# Patient Record
Sex: Female | Born: 1970 | Race: White | Hispanic: No | Marital: Single | Smoking: Never smoker
Health system: Southern US, Community
[De-identification: ages and names within clinical notes are randomized; demographics above are authoritative.]

## PROBLEM LIST (undated history)

## (undated) DIAGNOSIS — N390 Urinary tract infection, site not specified: Secondary | ICD-10-CM

## (undated) DIAGNOSIS — IMO0002 Reserved for concepts with insufficient information to code with codable children: Secondary | ICD-10-CM

## (undated) DIAGNOSIS — R310 Gross hematuria: Secondary | ICD-10-CM

## (undated) DIAGNOSIS — M8580 Other specified disorders of bone density and structure, unspecified site: Secondary | ICD-10-CM

## (undated) DIAGNOSIS — N3090 Cystitis, unspecified without hematuria: Secondary | ICD-10-CM

## (undated) DIAGNOSIS — R51 Headache: Secondary | ICD-10-CM

## (undated) DIAGNOSIS — K2970 Gastritis, unspecified, without bleeding: Secondary | ICD-10-CM

## (undated) DIAGNOSIS — N840 Polyp of corpus uteri: Secondary | ICD-10-CM

## (undated) DIAGNOSIS — K219 Gastro-esophageal reflux disease without esophagitis: Secondary | ICD-10-CM

## (undated) DIAGNOSIS — G43909 Migraine, unspecified, not intractable, without status migrainosus: Secondary | ICD-10-CM

## (undated) DIAGNOSIS — N399 Disorder of urinary system, unspecified: Secondary | ICD-10-CM

## (undated) DIAGNOSIS — R3129 Other microscopic hematuria: Secondary | ICD-10-CM

## (undated) DIAGNOSIS — Z87442 Personal history of urinary calculi: Secondary | ICD-10-CM

## (undated) DIAGNOSIS — N809 Endometriosis, unspecified: Secondary | ICD-10-CM

## (undated) DIAGNOSIS — F419 Anxiety disorder, unspecified: Secondary | ICD-10-CM

## (undated) DIAGNOSIS — J45909 Unspecified asthma, uncomplicated: Secondary | ICD-10-CM

## (undated) DIAGNOSIS — G629 Polyneuropathy, unspecified: Secondary | ICD-10-CM

## (undated) DIAGNOSIS — G8929 Other chronic pain: Secondary | ICD-10-CM

## (undated) DIAGNOSIS — R102 Pelvic and perineal pain: Secondary | ICD-10-CM

## (undated) DIAGNOSIS — R519 Headache, unspecified: Secondary | ICD-10-CM

## (undated) HISTORY — DX: Reserved for concepts with insufficient information to code with codable children: IMO0002

## (undated) HISTORY — PX: DIAGNOSTIC LAPAROSCOPY: SUR761

## (undated) HISTORY — DX: Cystitis, unspecified without hematuria: N30.90

## (undated) HISTORY — PX: GUM SURGERY: SHX658

## (undated) HISTORY — DX: Other microscopic hematuria: R31.29

## (undated) HISTORY — DX: Polyp of corpus uteri: N84.0

## (undated) HISTORY — DX: Endometriosis, unspecified: N80.9

## (undated) HISTORY — PX: POLYPECTOMY: SHX149

## (undated) HISTORY — DX: Polyneuropathy, unspecified: G62.9

## (undated) HISTORY — DX: Migraine, unspecified, not intractable, without status migrainosus: G43.909

## (undated) HISTORY — DX: Other specified disorders of bone density and structure, unspecified site: M85.80

## (undated) HISTORY — DX: Headache, unspecified: R51.9

## (undated) HISTORY — PX: DILATION AND CURETTAGE OF UTERUS: SHX78

## (undated) HISTORY — DX: Headache: R51

## (undated) HISTORY — DX: Urinary tract infection, site not specified: N39.0

## (undated) HISTORY — DX: Pelvic and perineal pain: R10.2

## (undated) HISTORY — DX: Gastritis, unspecified, without bleeding: K29.70

## (undated) HISTORY — DX: Other chronic pain: G89.29

## (undated) HISTORY — DX: Gross hematuria: R31.0

## (undated) HISTORY — DX: Unspecified asthma, uncomplicated: J45.909

---

## 2005-04-17 HISTORY — PX: OTHER SURGICAL HISTORY: SHX169

## 2005-04-17 HISTORY — PX: CYSTOSCOPY: SUR368

## 2008-01-14 ENCOUNTER — Ambulatory Visit: Payer: Self-pay | Admitting: Urology

## 2008-02-03 ENCOUNTER — Ambulatory Visit: Payer: Self-pay | Admitting: Urology

## 2009-11-29 ENCOUNTER — Encounter: Payer: Self-pay | Admitting: Otolaryngology

## 2009-12-16 ENCOUNTER — Encounter: Payer: Self-pay | Admitting: Otolaryngology

## 2010-01-15 ENCOUNTER — Encounter: Payer: Self-pay | Admitting: Otolaryngology

## 2012-11-19 ENCOUNTER — Ambulatory Visit: Payer: Self-pay

## 2013-10-03 ENCOUNTER — Ambulatory Visit: Payer: Self-pay | Admitting: Urology

## 2013-10-15 ENCOUNTER — Telehealth: Payer: Self-pay | Admitting: *Deleted

## 2013-10-15 NOTE — Telephone Encounter (Signed)
I called and spoke to pt. She has family hx of colon cancer in her father. But she also has constipation problems and sometimes diarrhea. Will schedule OV and pt is looking at schedule and will call back.

## 2013-10-15 NOTE — Telephone Encounter (Signed)
Pt called wanting to get scheduled for a TCS per pt Dr. Caroline MoreVanDavis is sending a referral I made pt aware that we do not have a referral. Please advise when you have the referral [phone]

## 2013-10-16 NOTE — Telephone Encounter (Signed)
Pt is scheduled for OV with Gerrit HallsAnna Sams, NP on 11/18/2013 at 1:30 PM.

## 2013-11-18 ENCOUNTER — Encounter: Payer: Self-pay | Admitting: Gastroenterology

## 2013-11-18 ENCOUNTER — Ambulatory Visit: Payer: BC Managed Care – PPO | Admitting: Gastroenterology

## 2013-11-18 ENCOUNTER — Encounter (INDEPENDENT_AMBULATORY_CARE_PROVIDER_SITE_OTHER): Payer: Self-pay

## 2013-11-18 VITALS — BP 101/68 | HR 70 | Temp 98.3°F | Resp 18 | Ht 70.0 in | Wt 155.0 lb

## 2013-11-21 DIAGNOSIS — Z1211 Encounter for screening for malignant neoplasm of colon: Secondary | ICD-10-CM | POA: Insufficient documentation

## 2013-11-21 DIAGNOSIS — Z0000 Encounter for general adult medical examination without abnormal findings: Secondary | ICD-10-CM | POA: Insufficient documentation

## 2013-11-21 NOTE — Progress Notes (Signed)
Present to discuss screening colonoscopy. Insurance will not cover at [facility] as this will have hospital charges associated. Patient to seek screening colonoscopy elsewhere. NO charge for today's visit.  

## 2014-08-28 ENCOUNTER — Ambulatory Visit: Payer: BC Managed Care – PPO | Admitting: Nurse Practitioner

## 2014-09-07 ENCOUNTER — Ambulatory Visit: Payer: BC Managed Care – PPO | Admitting: Nurse Practitioner

## 2014-09-28 ENCOUNTER — Ambulatory Visit (INDEPENDENT_AMBULATORY_CARE_PROVIDER_SITE_OTHER): Payer: BC Managed Care – PPO | Admitting: Nurse Practitioner

## 2014-09-28 ENCOUNTER — Encounter: Payer: Self-pay | Admitting: Nurse Practitioner

## 2014-09-28 ENCOUNTER — Telehealth: Payer: Self-pay | Admitting: *Deleted

## 2014-09-28 VITALS — BP 102/62 | HR 64 | Temp 97.1°F | Resp 14 | Ht 70.0 in | Wt 154.8 lb

## 2014-09-28 DIAGNOSIS — Z130 Encounter for screening for diseases of the blood and blood-forming organs and certain disorders involving the immune mechanism: Secondary | ICD-10-CM

## 2014-09-28 DIAGNOSIS — Z7689 Persons encountering health services in other specified circumstances: Secondary | ICD-10-CM | POA: Insufficient documentation

## 2014-09-28 DIAGNOSIS — Z131 Encounter for screening for diabetes mellitus: Secondary | ICD-10-CM

## 2014-09-28 DIAGNOSIS — G43809 Other migraine, not intractable, without status migrainosus: Secondary | ICD-10-CM | POA: Insufficient documentation

## 2014-09-28 DIAGNOSIS — G43109 Migraine with aura, not intractable, without status migrainosus: Secondary | ICD-10-CM

## 2014-09-28 DIAGNOSIS — Z1211 Encounter for screening for malignant neoplasm of colon: Secondary | ICD-10-CM

## 2014-09-28 DIAGNOSIS — Z7189 Other specified counseling: Secondary | ICD-10-CM | POA: Diagnosis not present

## 2014-09-28 DIAGNOSIS — H6123 Impacted cerumen, bilateral: Secondary | ICD-10-CM | POA: Insufficient documentation

## 2014-09-28 LAB — CBC WITH DIFFERENTIAL/PLATELET
BASOS ABS: 0 10*3/uL (ref 0.0–0.1)
Basophils Relative: 0.3 % (ref 0.0–3.0)
EOS PCT: 0.9 % (ref 0.0–5.0)
Eosinophils Absolute: 0.1 10*3/uL (ref 0.0–0.7)
HCT: 39.8 % (ref 36.0–46.0)
HEMOGLOBIN: 13.2 g/dL (ref 12.0–15.0)
LYMPHS ABS: 2.1 10*3/uL (ref 0.7–4.0)
Lymphocytes Relative: 32.3 % (ref 12.0–46.0)
MCHC: 33.2 g/dL (ref 30.0–36.0)
MCV: 90.9 fl (ref 78.0–100.0)
Monocytes Absolute: 0.5 10*3/uL (ref 0.1–1.0)
Monocytes Relative: 7.8 % (ref 3.0–12.0)
Neutro Abs: 3.9 10*3/uL (ref 1.4–7.7)
Neutrophils Relative %: 58.7 % (ref 43.0–77.0)
Platelets: 223 10*3/uL (ref 150.0–400.0)
RBC: 4.38 Mil/uL (ref 3.87–5.11)
RDW: 13.5 % (ref 11.5–15.5)
WBC: 6.6 10*3/uL (ref 4.0–10.5)

## 2014-09-28 LAB — HEMOGLOBIN A1C: Hgb A1c MFr Bld: 5.3 % (ref 4.6–6.5)

## 2014-09-28 NOTE — Assessment & Plan Note (Signed)
Patient's appointment is 6/30 to have colonoscopy. Family history of colon cancer.

## 2014-09-28 NOTE — Telephone Encounter (Signed)
Called pt to come back to have blood re-draw and explained that it was my error 

## 2014-09-28 NOTE — Telephone Encounter (Signed)
Pt came back.  

## 2014-09-28 NOTE — Patient Instructions (Signed)
See if the Debrox works, if not make an appointment for ear cleaning.   We will do non-fasting labs today and fasting labs at a later time.   Welcome to [facility]!

## 2014-09-28 NOTE — Assessment & Plan Note (Signed)
Being seen by Dr. Sherryll Burger at [facility]. Pt has also had inner ear rehab with Dr. Andee Poles. No signs of vertigo per pt, but both docs still feel it is inner ear related. Will follow.

## 2014-09-28 NOTE — Progress Notes (Signed)
   Subjective:    Patient ID: Cristina Wade, female    DOB: 04-02-1971, 44 y.o.   MRN: [id number]  HPI  Cristina Wade is a 44 yo female establishing care today.   1) New pt info:   Immunizations- tdap 9 years ago  Mammogram- 2015- [facility]; Dr. Arvil Chaco   Pap- 2015   Bone Density- 1995 osteopenia  Colonoscopy- 10/15/14 scheduled   Eye Exam- 2015   Dental Exam- UTD  2) Chronic Problems-  ENT- inner ear damage left ear   3) Acute Problems-  Ear wax, left ear hard hearing   Kidney- Saw Michiel Cowboy, PA, kidney stones, still haven't passed them. No recent viewing blood in urine Blood found in urine over past 2 years, concenre because she has to strain when urinating.   Sees Dr. Sherryll Burger for Neurology- Had vestibular migraines  Did Rehab for this with Dr. Andee Poles- ENT   Review of Systems  Constitutional: Negative for fever, chills, diaphoresis and fatigue.  Respiratory: Negative for chest tightness, shortness of breath and wheezing.   Cardiovascular: Negative for chest pain, palpitations and leg swelling.  Gastrointestinal: Negative for nausea, vomiting, diarrhea and rectal pain.  Skin: Negative for rash.  Neurological: Negative for dizziness, weakness, numbness and headaches.  Psychiatric/Behavioral: The patient is not nervous/anxious.        Objective:   Physical Exam  Constitutional: She is oriented to person, place, and time. She appears well-developed and well-nourished. No distress.  BP 102/62 mmHg  Pulse 64  Temp(Src) 97.1 F (36.2 C) (Oral)  Resp 14  Ht 5\' 10"  (1.778 m)  Wt 154 lb 12.8 oz (70.217 kg)  BMI 22.21 kg/m2  SpO2 95%  LMP 08/18/2014 (Exact Date)   HENT:  Head: Normocephalic and atraumatic.  Right Ear: External ear normal.  Left Ear: External ear normal.  Cardiovascular: Normal rate, regular rhythm, normal heart sounds and intact distal pulses.  Exam reveals no gallop and no friction rub.   No murmur heard. Pulmonary/Chest: Effort normal and breath  sounds normal. No respiratory distress. She has no wheezes. She has no rales. She exhibits no tenderness.  Neurological: She is alert and oriented to person, place, and time. No cranial nerve deficit. She exhibits normal muscle tone. Coordination normal.  Skin: Skin is warm and dry. No rash noted. She is not diaphoretic.  Psychiatric: She has a normal mood and affect. Her behavior is normal. Judgment and thought content normal.      Assessment & Plan:

## 2014-09-28 NOTE — Progress Notes (Signed)
Pre visit review using our clinic review tool, if applicable. No additional management support is needed unless otherwise documented below in the visit note. 

## 2014-09-28 NOTE — Assessment & Plan Note (Signed)
Patient and sister had same complaint. Pt has fully blocked TM's bilaterally with a wet dark cerumen. She will try Debrox at home and call if she needs to have them cleaned at the office. Will follow

## 2014-09-28 NOTE — Assessment & Plan Note (Signed)
Discussed acute and chronic issues. Reviewed health maintenance measures, PFSHx, and immunizations. Obtain non-fasting routine labs of A1c and CBC w/ diff today. Will obtain fasting at a later appointment

## 2014-10-02 ENCOUNTER — Ambulatory Visit: Payer: BC Managed Care – PPO | Admitting: Nurse Practitioner

## 2014-10-20 ENCOUNTER — Ambulatory Visit: Payer: Self-pay | Admitting: Urology

## 2014-10-21 ENCOUNTER — Encounter: Payer: Self-pay | Admitting: *Deleted

## 2014-10-22 ENCOUNTER — Telehealth: Payer: Self-pay | Admitting: Urology

## 2014-10-22 ENCOUNTER — Ambulatory Visit (INDEPENDENT_AMBULATORY_CARE_PROVIDER_SITE_OTHER): Payer: BC Managed Care – PPO | Admitting: Urology

## 2014-10-22 ENCOUNTER — Ambulatory Visit
Admission: RE | Admit: 2014-10-22 | Discharge: 2014-10-22 | Disposition: A | Discharge status: Home / Self Care | Payer: BC Managed Care – PPO | Source: Ambulatory Visit | Attending: Urology | Admitting: Urology

## 2014-10-22 ENCOUNTER — Encounter: Payer: Self-pay | Admitting: Urology

## 2014-10-22 VITALS — BP 108/69 | HR 71 | Ht 70.0 in | Wt 153.0 lb

## 2014-10-22 DIAGNOSIS — R109 Unspecified abdominal pain: Secondary | ICD-10-CM | POA: Insufficient documentation

## 2014-10-22 DIAGNOSIS — R3129 Other microscopic hematuria: Secondary | ICD-10-CM

## 2014-10-22 DIAGNOSIS — R300 Dysuria: Secondary | ICD-10-CM | POA: Insufficient documentation

## 2014-10-22 DIAGNOSIS — Z87442 Personal history of urinary calculi: Secondary | ICD-10-CM | POA: Diagnosis not present

## 2014-10-22 DIAGNOSIS — R312 Other microscopic hematuria: Secondary | ICD-10-CM

## 2014-10-22 DIAGNOSIS — R3916 Straining to void: Secondary | ICD-10-CM | POA: Insufficient documentation

## 2014-10-22 LAB — URINALYSIS, COMPLETE
Bilirubin, UA: NEGATIVE
Glucose, UA: NEGATIVE
Nitrite, UA: NEGATIVE
PROTEIN UA: NEGATIVE
Specific Gravity, UA: 1.02 (ref 1.005–1.030)
Urobilinogen, Ur: 0.2 mg/dL (ref 0.2–1.0)
pH, UA: 6 (ref 5.0–7.5)

## 2014-10-22 LAB — MICROSCOPIC EXAMINATION: Bacteria, UA: NONE SEEN

## 2014-10-22 LAB — BLADDER SCAN AMB NON-IMAGING: Scan Result: 0

## 2014-10-22 NOTE — Telephone Encounter (Signed)
Please tell the patient that her KUB was negative for stones and to please schedule an appointment after she has her colonoscopy.

## 2014-10-22 NOTE — Progress Notes (Signed)
10/22/2014 1:32 PM   Cristina Wade Jul 12, 1970 [id number]  Referring provider: Octaviano Batty, MD [address] [place], [place] [address]  Chief Complaint  Patient presents with  . Hematuria    one year follow up, patient also mentioned not being able to urinate well    HPI: Cristina Wade is a 44 year old white female who presents today for a 1 year follow-up after her workup for hematuria. Her hematuria workup last year did not reveal any GU pathology, but some possible punctate stones.  She denies any gross hematuria, but she does admit to a dark color to her urine on occasion.  She is not experiencing dysuria or suprapubic pain.   She denies any fevers, chills, nausea or vomiting. She is having some mild bilateral flank pain.  She states she is having difficulty urinating.  She often finds herself straining to urinate.  She has mild stress incontinence, losing urine when she laughs, coughs or sneezes.  The leakage is not severe enough to wear a pad.  She is consuming 24 oz of water with her breakfast, lunch and dinner.  Her PVR today is 0 mL. She is scheduled to undergo a colonoscopy for constipation issues in the next few weeks.    PMH: Past Medical History  Diagnosis Date  . Asthma   . Frequent headaches   . Migraines   . UTI (lower urinary tract infection)   . Kidney stones   . Cystitis   . Osteopenia   . Endometriosis   . Microscopic hematuria   . Gross hematuria   . Cystocele     Surgical History: Past Surgical History  Procedure Laterality Date  . Gum surgery    . Laproscopy  2007  . Cystoscopy  2007    with biopsy     Home Medications:    Medication List       This list is accurate as of: 10/22/14  1:32 PM.  Always use your most recent med list.               Calcium-Vitamin D 600-200 MG-UNIT per tablet  Take 1 tablet by mouth.     levonorgestrel-ethinyl estradiol 0.15-0.03 MG tablet  Commonly known as:  SEASONALE,INTROVALE,JOLESSA  Take 1  tablet by mouth.     MULTI-VITAMINS Tabs  Take 1 tablet by mouth.     RA VITAMIN B-12 TR 1000 MCG Tbcr  Generic drug:  Cyanocobalamin  Take 1 tablet by mouth.        Allergies:  Allergies  Allergen Reactions  . Lac Bovis Other (See Comments)  . Penicillins     Family History: Family History  Problem Relation Age of Onset  . Colon cancer Father   . Arthritis Father   . Hyperlipidemia Father   . Cancer Father     colon/prostate  . Arthritis Mother   . Heart disease Mother   . Stroke Mother   . Hypertension Mother   . Kidney disease Mother   . Diabetes Mother   . Cancer Maternal Grandmother   . Cancer Paternal Grandfather     prostate    Social History:  reports that she has never smoked. She has never used smokeless tobacco. She reports that she does not drink alcohol or use illicit drugs.  ROS: Urological Symptom Review  Patient is experiencing the following symptoms: Frequent urination Trouble starting stream Have to strain to urinate Blood in urine   Review of Systems  Gastrointestinal (upper)  :  Negative for upper GI symptoms  Gastrointestinal (lower) : Constipation  Constitutional : Negative for symptoms  Skin: Negative for skin symptoms  Eyes: Negative for eye symptoms  Ear/Nose/Throat : Negative for Ear/Nose/Throat symptoms  Hematologic/Lymphatic: Negative for Hematologic/Lymphatic symptoms  Cardiovascular : Negative for cardiovascular symptoms  Respiratory : Negative for respiratory symptoms  Endocrine: Negative for endocrine symptoms  Musculoskeletal: Negative for musculoskeletal symptoms  Neurological: Negative for neurological symptoms  Psychologic: Negative for psychiatric symptoms   Physical Exam: LMP 08/18/2014 (Exact Date)  Normal external genitalia.  Normal urethral meatus. No urethral masses and/or tenderness. Urethral hypermobility is noted but no leakage on exam. No bladder fullness or masses. No vaginal  lesions or discharge. No cervical motion tenderness was noted. Uterus was freely mobile and not fixed. No adnexal masses are palpated. Normal rectal tone, no masses. Normal anus and perineum.    Laboratory Data: Results for orders placed or performed in visit on 10/22/14  Microscopic Examination  Result Value Ref Range   WBC, UA 0-5 0 -  5 /hpf   RBC, UA 3-10 (A) 0 -  2 /hpf   Epithelial Cells (non renal) 0-10 0 - 10 /hpf   Mucus, UA Present (A) Not Estab.   Bacteria, UA None seen None seen/Few  Urinalysis, Complete  Result Value Ref Range   Specific Gravity, UA 1.020 1.005 - 1.030   pH, UA 6.0 5.0 - 7.5   Color, UA Yellow Yellow   Appearance Ur Clear Clear   Leukocytes, UA Trace (A) Negative   Protein, UA Negative Negative/Trace   Glucose, UA Negative Negative   Ketones, UA Trace (A) Negative   RBC, UA 2+ (A) Negative   Bilirubin, UA Negative Negative   Urobilinogen, Ur 0.2 0.2 - 1.0 mg/dL   Nitrite, UA Negative Negative   Microscopic Examination See below:   BLADDER SCAN AMB NON-IMAGING  Result Value Ref Range   Scan Result 0    Lab Results  Component Value Date   WBC 6.6 09/28/2014   HGB 13.2 09/28/2014   HCT 39.8 09/28/2014   MCV 90.9 09/28/2014   PLT 223.0 09/28/2014    No results found for: CREATININE  No results found for: PSA  No results found for: TESTOSTERONE  Lab Results  Component Value Date   HGBA1C 5.3 09/28/2014    Urinalysis No results found for: COLORURINE, APPEARANCEUR, LABSPEC, PHURINE, GLUCOSEU, HGBUR, BILIRUBINUR, KETONESUR, PROTEINUR, UROBILINOGEN, NITRITE, LEUKOCYTESUR  Pertinent Imaging: CLINICAL DATA: Microscopic hematuria today. Bilateral flank pain. History of nephrolithiasis and dysuria.  EXAM: ABDOMEN - 1 VIEW  COMPARISON: Abdomen and pelvis CT dated 10/03/2013.  FINDINGS: Previously demonstrated tiny pelvic phlebolith on the left inferiorly. No visible calcified urinary tract calculi. Normal bowel gas pattern. Normal  appearing bones.  IMPRESSION: No acute abnormality. No radiographically visible urinary tract calculi.  Assessment & Plan:    1. Microscopic hematuria:   Patient underwent a hematuria workup with CT urogram and cystoscopy which was negative for GU pathology, some possible punctate bilateral stones.  She still continues to have microscopic hematuria on today's UA. We will refer her to nephrology for further evaluation.  We'll obtain a KUB today as well.  - Urinalysis, Complete  2. Straining to urinate:   Patient appears to be consuming adequate fluids. Her PVR today is 0 mL. She did have urethral hypermobility on exam today, this may be causing the sensation of needing to strain to urinate. She also is suffering from constipation and that too may be continuing  to her urinary issues. She is to undergo this colonoscopy in 2 weeks.  If her urinary issues are not relieved after her constipation has been addressed, we will refer for UDS.  - BLADDER SCAN AMB NON-IMAGING   No Follow-up on file.  Michiel CowboySHANNON Bruchy Mikel, PA-C  [facility] [address], [address] [place], [place] [address] [phone]

## 2014-10-23 NOTE — Telephone Encounter (Signed)
Spoke with pt in reference to KUB. Pt voiced understanding stating she will call back for an appt after colonoscopy is completed. Cw,lpn

## 2014-11-05 ENCOUNTER — Ambulatory Visit
Admission: RE | Admit: 2014-11-05 | Discharge: 2014-11-05 | Disposition: A | Discharge status: Home / Self Care | Payer: BC Managed Care – PPO | Source: Ambulatory Visit | Attending: Unknown Physician Specialty | Admitting: Unknown Physician Specialty

## 2014-11-05 ENCOUNTER — Ambulatory Visit: Payer: BC Managed Care – PPO | Admitting: Anesthesiology

## 2014-11-05 ENCOUNTER — Encounter
Admission: RE | Disposition: A | Discharge status: Home / Self Care | Payer: Self-pay | Source: Ambulatory Visit | Attending: Unknown Physician Specialty

## 2014-11-05 ENCOUNTER — Encounter: Payer: Self-pay | Admitting: *Deleted

## 2014-11-05 DIAGNOSIS — Z8042 Family history of malignant neoplasm of prostate: Secondary | ICD-10-CM | POA: Diagnosis not present

## 2014-11-05 DIAGNOSIS — Z87442 Personal history of urinary calculi: Secondary | ICD-10-CM | POA: Diagnosis not present

## 2014-11-05 DIAGNOSIS — Z841 Family history of disorders of kidney and ureter: Secondary | ICD-10-CM | POA: Diagnosis not present

## 2014-11-05 DIAGNOSIS — G43909 Migraine, unspecified, not intractable, without status migrainosus: Secondary | ICD-10-CM | POA: Insufficient documentation

## 2014-11-05 DIAGNOSIS — Z1211 Encounter for screening for malignant neoplasm of colon: Secondary | ICD-10-CM | POA: Insufficient documentation

## 2014-11-05 DIAGNOSIS — Z800 Family history of malignant neoplasm of digestive organs: Secondary | ICD-10-CM | POA: Diagnosis not present

## 2014-11-05 DIAGNOSIS — Z8261 Family history of arthritis: Secondary | ICD-10-CM | POA: Insufficient documentation

## 2014-11-05 DIAGNOSIS — Z8249 Family history of ischemic heart disease and other diseases of the circulatory system: Secondary | ICD-10-CM | POA: Insufficient documentation

## 2014-11-05 DIAGNOSIS — Z8489 Family history of other specified conditions: Secondary | ICD-10-CM | POA: Diagnosis not present

## 2014-11-05 DIAGNOSIS — K640 First degree hemorrhoids: Secondary | ICD-10-CM | POA: Insufficient documentation

## 2014-11-05 DIAGNOSIS — Z808 Family history of malignant neoplasm of other organs or systems: Secondary | ICD-10-CM | POA: Insufficient documentation

## 2014-11-05 DIAGNOSIS — M8580 Other specified disorders of bone density and structure, unspecified site: Secondary | ICD-10-CM | POA: Insufficient documentation

## 2014-11-05 DIAGNOSIS — Z79899 Other long term (current) drug therapy: Secondary | ICD-10-CM | POA: Diagnosis not present

## 2014-11-05 DIAGNOSIS — Z833 Family history of diabetes mellitus: Secondary | ICD-10-CM | POA: Insufficient documentation

## 2014-11-05 DIAGNOSIS — Z823 Family history of stroke: Secondary | ICD-10-CM | POA: Insufficient documentation

## 2014-11-05 HISTORY — PX: COLONOSCOPY: SHX5424

## 2014-11-05 LAB — HM COLONOSCOPY: HM Colonoscopy: NORMAL

## 2014-11-05 LAB — POCT PREGNANCY, URINE: PREG TEST UR: NEGATIVE

## 2014-11-05 SURGERY — COLONOSCOPY
Anesthesia: General

## 2014-11-05 MED ORDER — PROPOFOL INFUSION 10 MG/ML OPTIME
INTRAVENOUS | Status: DC | PRN
  Administered 2014-11-05: 140 ug/kg/min via INTRAVENOUS

## 2014-11-05 MED ORDER — PROPOFOL 10 MG/ML IV BOLUS
INTRAVENOUS | Status: DC | PRN
  Administered 2014-11-05: 50 mg via INTRAVENOUS

## 2014-11-05 MED ORDER — MIDAZOLAM HCL 5 MG/5ML IJ SOLN
INTRAMUSCULAR | Status: DC | PRN
  Administered 2014-11-05: 1 mg via INTRAVENOUS

## 2014-11-05 MED ORDER — SODIUM CHLORIDE 0.9 % IV SOLN
INTRAVENOUS | Status: DC
Start: 2014-11-05 — End: 2014-11-05

## 2014-11-05 MED ORDER — EPHEDRINE SULFATE 50 MG/ML IJ SOLN
INTRAMUSCULAR | Status: DC | PRN
  Administered 2014-11-05: 5 mg via INTRAVENOUS

## 2014-11-05 MED ORDER — FENTANYL CITRATE (PF) 100 MCG/2ML IJ SOLN
INTRAMUSCULAR | Status: DC | PRN
  Administered 2014-11-05: 50 ug via INTRAVENOUS

## 2014-11-05 MED ORDER — SODIUM CHLORIDE 0.9 % IV SOLN
INTRAVENOUS | Status: DC
  Administered 2014-11-05: 1000 mL via INTRAVENOUS

## 2014-11-05 NOTE — Anesthesia Postprocedure Evaluation (Signed)
  Anesthesia Post-op Note  Patient: Cristina Wade  Procedure(s) Performed: Procedure(s): COLONOSCOPY (N/A)  Anesthesia type:General  Patient location: PACU  Post pain: Pain level controlled  Post assessment: Post-op Vital signs reviewed, Patient's Cardiovascular Status Stable, Respiratory Function Stable, Patent Airway and No signs of Nausea or vomiting  Post vital signs: Reviewed and stable  Last Vitals:  Filed Vitals:   11/05/14 1702  BP: 108/57  Pulse: 66  Temp:   Resp: 18    Level of consciousness: awake, alert  and patient cooperative  Complications: No apparent anesthesia complications

## 2014-11-05 NOTE — Transfer of Care (Signed)
Immediate Anesthesia Transfer of Care Note  Patient: Cristina Wade  Procedure(s) Performed: Procedure(s): COLONOSCOPY (N/A)  Patient Location: PACU  Anesthesia Type:General  Level of Consciousness: sedated  Airway & Oxygen Therapy: Patient Spontanous Breathing and Patient connected to nasal cannula oxygen  Post-op Assessment: Report given to RN and Post -op Vital signs reviewed and stable  Post vital signs: Reviewed and stable  Last Vitals:  Filed Vitals:   11/05/14 1652  BP: 93/44  Pulse:   Temp: 36 C  Resp: 17    Complications: No apparent anesthesia complications

## 2014-11-05 NOTE — H&P (Signed)
Primary Care Physician:  Carollee Leitz, NP Primary Gastroenterologist:  Dr. Mechele Collin  Pre-Procedure History & Physical: HPI:  Cristina Wade is a 44 y.o. female is here for an colonoscopy.   Past Medical History  Diagnosis Date  . Frequent headaches   . Migraines   . UTI (lower urinary tract infection)   . Kidney stones   . Cystitis   . Osteopenia   . Endometriosis   . Microscopic hematuria   . Gross hematuria   . Cystocele     Past Surgical History  Procedure Laterality Date  . Gum surgery    . Laproscopy  2007  . Cystoscopy  2007    with biopsy     Prior to Admission medications   Medication Sig Start Date End Date Taking? Authorizing Provider  Calcium-Vitamin D 600-200 MG-UNIT per tablet Take 1 tablet by mouth.   Yes Historical Provider, MD  Cyanocobalamin (RA VITAMIN B-12 TR) 1000 MCG TBCR Take 1 tablet by mouth.   Yes Historical Provider, MD  levonorgestrel-ethinyl estradiol (SEASONALE,INTROVALE,JOLESSA) 0.15-0.03 MG tablet Take 1 tablet by mouth.   Yes Historical Provider, MD  Multiple Vitamin (MULTI-VITAMINS) TABS Take 1 tablet by mouth.   Yes Historical Provider, MD    Allergies as of 08/18/2014 - Review Complete 11/18/2013  Allergen Reaction Noted  . Penicillins  11/18/2013    Family History  Problem Relation Age of Onset  . Colon cancer Father   . Arthritis Father   . Hyperlipidemia Father   . Cancer Father     colon/prostate  . Arthritis Mother   . Heart disease Mother   . Stroke Mother     TIA  . Hypertension Mother   . Kidney disease Mother   . Diabetes Mother   . Cancer Maternal Grandmother   . Cancer Paternal Grandfather     prostate  . Transient ischemic attack Father     History   Social History  . Marital Status: Single    Spouse Name: N/A  . Number of Children: N/A  . Years of Education: N/A   Occupational History  . Not on file.   Social History Main Topics  . Smoking status: Never Smoker   . Smokeless tobacco: Never Used   . Alcohol Use: No  . Drug Use: No  . Sexual Activity: No   Other Topics Concern  . Not on file   Social History Narrative   Lives with twin sister   Work- Music therapist    No pets    No children    Right handed    No caffeine daily- tea occasionally; eats chocolate    Enjoys-     Review of Systems: See HPI, otherwise negative ROS  Physical Exam: BP 98/62 mmHg  Pulse 76  Temp(Src) 98.5 F (36.9 C) (Tympanic)  Resp 16  Ht  (1.778 m)  Wt 69.4 kg (153 lb)  BMI 21.95 kg/m2  SpO2 100%  LMP 08/19/2014 (Approximate) General:   Alert,  pleasant and cooperative in NAD Head:  Normocephalic and atraumatic. Neck:  Supple; no masses or thyromegaly. Lungs:  Clear throughout to auscultation.    Heart:  Regular rate and rhythm. Abdomen:  Soft, nontender and nondistended. Normal bowel sounds, without guarding, and without rebound.   Neurologic:  Alert and  oriented x4;  grossly normal neurologically.  Impression/Plan: Cristina Wade is here for an colonoscopy to be performed for screening, fam hx colon cancer in father  Risks, benefits, limitations,  and alternatives regarding  colonoscopy have been reviewed with the patient.  Questions have been answered.  All parties agreeable.   Lynnae Prude, MD  11/05/2014, 4:18 PM   Primary Care Physician:  Carollee Leitz, NP Primary Gastroenterologist:  Dr. Mechele Collin  Pre-Procedure History & Physical: HPI:  Cristina Wade is a 44 y.o. female is here for an colonoscopy.   Past Medical History  Diagnosis Date  . Frequent headaches   . Migraines   . UTI (lower urinary tract infection)   . Kidney stones   . Cystitis   . Osteopenia   . Endometriosis   . Microscopic hematuria   . Gross hematuria   . Cystocele     Past Surgical History  Procedure Laterality Date  . Gum surgery    . Laproscopy  2007  . Cystoscopy  2007    with biopsy     Prior to Admission medications   Medication Sig Start Date End Date  Taking? Authorizing Provider  Calcium-Vitamin D 600-200 MG-UNIT per tablet Take 1 tablet by mouth.   Yes Historical Provider, MD  Cyanocobalamin (RA VITAMIN B-12 TR) 1000 MCG TBCR Take 1 tablet by mouth.   Yes Historical Provider, MD  levonorgestrel-ethinyl estradiol (SEASONALE,INTROVALE,JOLESSA) 0.15-0.03 MG tablet Take 1 tablet by mouth.   Yes Historical Provider, MD  Multiple Vitamin (MULTI-VITAMINS) TABS Take 1 tablet by mouth.   Yes Historical Provider, MD    Allergies as of 08/18/2014 - Review Complete 11/18/2013  Allergen Reaction Noted  . Penicillins  11/18/2013    Family History  Problem Relation Age of Onset  . Colon cancer Father   . Arthritis Father   . Hyperlipidemia Father   . Cancer Father     colon/prostate  . Arthritis Mother   . Heart disease Mother   . Stroke Mother     TIA  . Hypertension Mother   . Kidney disease Mother   . Diabetes Mother   . Cancer Maternal Grandmother   . Cancer Paternal Grandfather     prostate  . Transient ischemic attack Father     History   Social History  . Marital Status: Single    Spouse Name: N/A  . Number of Children: N/A  . Years of Education: N/A   Occupational History  . Not on file.   Social History Main Topics  . Smoking status: Never Smoker   . Smokeless tobacco: Never Used  . Alcohol Use: No  . Drug Use: No  . Sexual Activity: No   Other Topics Concern  . Not on file   Social History Narrative   Lives with twin sister   Work- Music therapist    No pets    No children    Right handed    No caffeine daily- tea occasionally; eats chocolate    Enjoys-     Review of Systems: See HPI, otherwise negative ROS  Physical Exam: BP 98/62 mmHg  Pulse 76  Temp(Src) 98.5 F (36.9 C) (Tympanic)  Resp 16  Ht 5\' 10"  (1.778 m)  Wt 69.4 kg (153 lb)  BMI 21.95 kg/m2  SpO2 100%  LMP 08/19/2014 (Approximate) General:   Alert,  pleasant and cooperative in NAD Head:  Normocephalic and  atraumatic. Neck:  Supple; no masses or thyromegaly. Lungs:  Clear throughout to auscultation.    Heart:  Regular rate and rhythm. Abdomen:  Soft, nontender and nondistended. Normal bowel sounds, without guarding, and without rebound.   Neurologic:  Alert and  oriented x4;  grossly normal neurologically.  Impression/Plan: Cristina Wade is here for an colonoscopy to be performed for family history colon cancer in father, screening colon today  Risks, benefits, limitations, and alternatives regarding  colonoscopy have been reviewed with the patient.  Questions have been answered.  All parties agreeable.   Lynnae Prude, MD  11/05/2014, 4:18 PM

## 2014-11-05 NOTE — Anesthesia Preprocedure Evaluation (Signed)
Anesthesia Evaluation  Patient identified by MRN, date of birth, ID band Patient awake    Reviewed: Allergy & Precautions, NPO status , Patient's Chart, lab work & pertinent test results  History of Anesthesia Complications Negative for: history of anesthetic complications  Airway Mallampati: I       Dental no notable dental hx.    Pulmonary    Pulmonary exam normal       Cardiovascular negative cardio ROS Normal cardiovascular exam    Neuro/Psych  Headaches, negative psych ROS   GI/Hepatic negative GI ROS, Neg liver ROS,   Endo/Other  negative endocrine ROS  Renal/GU      Musculoskeletal negative musculoskeletal ROS (+)   Abdominal Normal abdominal exam  (+)   Peds negative pediatric ROS (+)  Hematology negative hematology ROS (+)   Anesthesia Other Findings   Reproductive/Obstetrics negative OB ROS                             Anesthesia Physical Anesthesia Plan  ASA: I  Anesthesia Plan: General   Post-op Pain Management:    Induction: Intravenous  Airway Management Planned: Nasal Cannula  Additional Equipment:   Intra-op Plan:   Post-operative Plan:   Informed Consent: I have reviewed the patients History and Physical, chart, labs and discussed the procedure including the risks, benefits and alternatives for the proposed anesthesia with the patient or authorized representative who has indicated his/her understanding and acceptance.     Plan Discussed with: CRNA  Anesthesia Plan Comments:         Anesthesia Quick Evaluation

## 2014-11-05 NOTE — Op Note (Signed)
[facility] Patient Name: Cristina Wade Procedure Date: 11/05/2014 4:26 PM MRN: [id number] Account #: [id number] Date of Birth: 1970-07-07 Admit Type: Outpatient Age: 44 Room: [facility] ENDO ROOM 1 Gender: Female Note Status: Finalized Procedure:         Colonoscopy Indications:       Screening in patient at increased risk: Family history of                     1st-degree relative with colorectal cancer Providers:         Scot Jun, MD Referring MD:      Naomie Dean (Referring MD) Medicines:         Propofol per Anesthesia Complications:     No immediate complications. Procedure:         Pre-Anesthesia Assessment:                    - After reviewing the risks and benefits, the patient was                     deemed in satisfactory condition to undergo the procedure.                    After obtaining informed consent, the colonoscope was                     passed under direct vision. Throughout the procedure, the                     patient's blood pressure, pulse, and oxygen saturations                     were monitored continuously. The Colonoscope was                     introduced through the anus and advanced to the the cecum,                     identified by appendiceal orifice and ileocecal valve. The                     colonoscopy was performed without difficulty. The patient                     tolerated the procedure well. The quality of the bowel                     preparation was excellent. Findings:      Internal hemorrhoids were found during endoscopy. The hemorrhoids were       small and Grade I (internal hemorrhoids that do not prolapse).      The exam was otherwise without abnormality. Impression:        - Internal hemorrhoids.                    - The examination was otherwise normal.                    - No specimens collected. Recommendation:    - Repeat colonoscopy in 5 years for screening purposes. Scot Jun,  MD 11/05/2014 4:49:15 PM This report has been signed electronically. Number of Addenda: 0 Note Initiated On: 11/05/2014 4:26 PM Scope Withdrawal Time: 0 hours 9 minutes 24 seconds  Total Procedure Duration: 0 hours 15 minutes  43 seconds       [facility]

## 2014-11-09 ENCOUNTER — Encounter: Payer: Self-pay | Admitting: Unknown Physician Specialty

## 2014-11-17 ENCOUNTER — Encounter: Payer: Self-pay | Admitting: Urology

## 2014-11-17 ENCOUNTER — Ambulatory Visit (INDEPENDENT_AMBULATORY_CARE_PROVIDER_SITE_OTHER): Payer: BC Managed Care – PPO | Admitting: Urology

## 2014-11-17 VITALS — BP 123/75 | HR 65 | Wt 146.4 lb

## 2014-11-17 DIAGNOSIS — R312 Other microscopic hematuria: Secondary | ICD-10-CM | POA: Diagnosis not present

## 2014-11-17 DIAGNOSIS — R3916 Straining to void: Secondary | ICD-10-CM | POA: Diagnosis not present

## 2014-11-17 DIAGNOSIS — R3129 Other microscopic hematuria: Secondary | ICD-10-CM

## 2014-11-17 LAB — URINALYSIS, COMPLETE
Bilirubin, UA: NEGATIVE
Glucose, UA: NEGATIVE
Ketones, UA: NEGATIVE
NITRITE UA: NEGATIVE
PROTEIN UA: NEGATIVE
Specific Gravity, UA: 1.015 (ref 1.005–1.030)
UUROB: 0.2 mg/dL (ref 0.2–1.0)
pH, UA: 8.5 — ABNORMAL HIGH (ref 5.0–7.5)

## 2014-11-17 LAB — MICROSCOPIC EXAMINATION

## 2014-11-17 LAB — BLADDER SCAN AMB NON-IMAGING: SCAN RESULT: 0

## 2014-11-19 DIAGNOSIS — R3129 Other microscopic hematuria: Secondary | ICD-10-CM | POA: Insufficient documentation

## 2014-11-19 NOTE — Progress Notes (Signed)
7:37 PM   Bryan Omura 12/24/1970 [id number]  Referring provider: Carollee Leitz, NP [address] [place], [place] [address]  Chief Complaint  Patient presents with  . Follow-up    " follow up after colonoscopy"    HPI: Mrs. Cristina Wade is a 44 year old white female who presents today for follow up after her colonoscopy did not reveal any GI pathology.    She states she is having difficulty urinating.  She often finds herself straining to urinate.  She has mild stress incontinence, losing urine when she laughs, coughs or sneezes.  The leakage is not severe enough to wear a pad.  She is consuming 24 oz of water with her breakfast, lunch and dinner.  Her PVR with [place] have been minimal.    She was having gross hematuria.  Her hematuria workup last year did not reveal any GU pathology, but some possible punctate stones.  She denies any recent gross hematuria, but she does admit to a dark color to her urine on occasion.  She is not experiencing dysuria or suprapubic pain.   She denies any fevers, chills, nausea or vomiting. She is having some mild bilateral flank pain.    PMH: Past Medical History  Diagnosis Date  . Frequent headaches   . Migraines   . UTI (lower urinary tract infection)   . Kidney stones   . Cystitis   . Osteopenia   . Endometriosis   . Microscopic hematuria   . Gross hematuria   . Cystocele     Surgical History: Past Surgical History  Procedure Laterality Date  . Gum surgery    . Laproscopy  2007  . Cystoscopy  2007    with biopsy   . Colonoscopy N/A 11/05/2014    Procedure: COLONOSCOPY;  Surgeon: Scot Jun, MD;  Location: [facility] ENDOSCOPY;  Service: Endoscopy;  Laterality: N/A;    Home Medications:    Medication List       This list is accurate as of: 11/17/14 11:59 PM.  Always use your most recent med list.               Calcium-Vitamin D 600-200 MG-UNIT per tablet  Take 1 tablet by mouth.     levonorgestrel-ethinyl  estradiol 0.15-0.03 MG tablet  Commonly known as:  SEASONALE,INTROVALE,JOLESSA  Take 1 tablet by mouth.     MULTI-VITAMINS Tabs  Take 1 tablet by mouth.     RA VITAMIN B-12 TR 1000 MCG Tbcr  Generic drug:  Cyanocobalamin  Take 1 tablet by mouth.        Allergies:  Allergies  Allergen Reactions  . Lac Bovis Other (See Comments)  . Penicillins     Family History: Family History  Problem Relation Age of Onset  . Colon cancer Father   . Arthritis Father   . Hyperlipidemia Father   . Cancer Father     colon/prostate  . Arthritis Mother   . Heart disease Mother   . Stroke Mother     TIA  . Hypertension Mother   . Kidney disease Mother   . Diabetes Mother   . Cancer Maternal Grandmother   . Cancer Paternal Grandfather     prostate  . Transient ischemic attack Father     Social History:  reports that she has never smoked. She has never used smokeless tobacco. She reports that she does not drink alcohol or use illicit drugs.  ROS: Urological Symptom Review  Patient is experiencing the  following symptoms: Frequent urination Trouble starting stream Have to strain to urinate Blood in urine   Review of Systems  Gastrointestinal (upper)  : Negative for upper GI symptoms  Gastrointestinal (lower) : Constipation  Constitutional : Negative for symptoms  Skin: Negative for skin symptoms  Eyes: Negative for eye symptoms  Ear/Nose/Throat : Negative for Ear/Nose/Throat symptoms  Hematologic/Lymphatic: Negative for Hematologic/Lymphatic symptoms  Cardiovascular : Negative for cardiovascular symptoms  Respiratory : Negative for respiratory symptoms  Endocrine: Negative for endocrine symptoms  Musculoskeletal: Negative for musculoskeletal symptoms  Neurological: Negative for neurological symptoms  Psychologic: Negative for psychiatric symptoms   Physical Exam: BP 123/75 mmHg  Pulse 65  Wt 146 lb 6.4 oz (66.407 kg)  Normal external genitalia.   Normal urethral meatus. No urethral masses and/or tenderness. Urethral hypermobility is noted but no leakage on exam. No bladder fullness or masses. No vaginal lesions or discharge. No cervical motion tenderness was noted. Uterus was freely mobile and not fixed. No adnexal masses are palpated. Normal rectal tone, no masses. Normal anus and perineum.    Laboratory Data: Results for orders placed or performed in visit on 11/17/14  Microscopic Examination  Result Value Ref Range   WBC, UA 0-5 0 -  5 /hpf   RBC, UA 0-2 0 -  2 /hpf   Epithelial Cells (non renal) >10E 0 - 10 /hpf   Renal Epithel, UA 0-10 (A) None seen /hpf   Crystals Present (A) N/A   Crystal Type Amorphous Sediment N/A   Bacteria, UA Few None seen/Few  Urinalysis, Complete  Result Value Ref Range   Specific Gravity, UA 1.015 1.005 - 1.030   pH, UA 8.5 (H) 5.0 - 7.5   Color, UA Yellow Yellow   Appearance Ur Cloudy (A) Clear   Leukocytes, UA 1+ (A) Negative   Protein, UA Negative Negative/Trace   Glucose, UA Negative Negative   Ketones, UA Negative Negative   RBC, UA 1+ (A) Negative   Bilirubin, UA Negative Negative   Urobilinogen, Ur 0.2 0.2 - 1.0 mg/dL   Nitrite, UA Negative Negative   Microscopic Examination See below:   BLADDER SCAN AMB NON-IMAGING  Result Value Ref Range   Scan Result 0    Lab Results  Component Value Date   WBC 6.6 09/28/2014   HGB 13.2 09/28/2014   HCT 39.8 09/28/2014   MCV 90.9 09/28/2014   PLT 223.0 09/28/2014    No results found for: CREATININE  No results found for: PSA  No results found for: TESTOSTERONE  Lab Results  Component Value Date   HGBA1C 5.3 09/28/2014    Urinalysis    Component Value Date/Time   GLUCOSEU Negative 11/17/2014 1100   BILIRUBINUR Negative 11/17/2014 1100   NITRITE Negative 11/17/2014 1100   LEUKOCYTESUR 1+* 11/17/2014 1100    Pertinent Imaging: CLINICAL DATA: Microscopic hematuria today. Bilateral flank pain. History of nephrolithiasis and  dysuria.  EXAM: ABDOMEN - 1 VIEW  COMPARISON: Abdomen and pelvis CT dated 10/03/2013.  FINDINGS: Previously demonstrated tiny pelvic phlebolith on the left inferiorly. No visible calcified urinary tract calculi. Normal bowel gas pattern. Normal appearing bones.  IMPRESSION: No acute abnormality. No radio graphically visible urinary tract calculi.  Assessment & Plan:    1. Microscopic hematuria:   Patient underwent a hematuria workup with CT urogram and cystoscopy which was negative for GU pathology, some possible punctate bilateral stones.  She still continues to have microscopic hematuria on today's UA. We will refer her to nephrology for  further evaluation.  We'll obtain a KUB today as well.  - Urinalysis, Complete  2. Straining to urinate:   Patient appears to be consuming adequate fluids. Her PVR with our office have been minimal.  She did have urethral hypermobility on exam today, this may be causing the sensation of needing to strain to urinate.  The history per the patient seems to represent an obstructive process, but she is not having large residuals.  I would like her to undergo UDS for further evaluation.    - BLADDER SCAN AMB NON-IMAGING   Return for UDS at [facility].  Michiel Cowboy, PA-C  [facility] [address] [place], [place] [address] [phone]

## 2014-11-30 ENCOUNTER — Telehealth: Payer: Self-pay | Admitting: Urology

## 2014-11-30 NOTE — Telephone Encounter (Signed)
Patient referral to [facility] on 11/27/2014 at 1:20pm.

## 2014-12-03 LAB — HM MAMMOGRAPHY: HM Mammogram: NEGATIVE

## 2015-03-04 ENCOUNTER — Other Ambulatory Visit: Payer: Self-pay | Admitting: *Deleted

## 2015-03-04 ENCOUNTER — Other Ambulatory Visit: Payer: Self-pay

## 2015-03-04 ENCOUNTER — Ambulatory Visit: Payer: BC Managed Care – PPO | Admitting: Urology

## 2015-03-04 ENCOUNTER — Telehealth: Payer: Self-pay | Admitting: *Deleted

## 2015-03-04 DIAGNOSIS — N399 Disorder of urinary system, unspecified: Secondary | ICD-10-CM

## 2015-03-04 NOTE — Telephone Encounter (Signed)
Patient came in and I gave her the info for [facility]. [facility] Dr. Patsi Searsannenbaum per Carollee HerterShannon. Patient understands to follow up in one year on hematuria.

## 2015-03-04 NOTE — Telephone Encounter (Signed)
LMOM for patient to call office back have [facility] regarding appointment later this afternoon.

## 2015-04-08 ENCOUNTER — Encounter
Admission: RE | Admit: 2015-04-08 | Discharge: 2015-04-08 | Disposition: A | Discharge status: Home / Self Care | Payer: BC Managed Care – PPO | Source: Ambulatory Visit | Attending: Obstetrics & Gynecology | Admitting: Obstetrics & Gynecology

## 2015-04-08 DIAGNOSIS — Z01812 Encounter for preprocedural laboratory examination: Secondary | ICD-10-CM | POA: Diagnosis not present

## 2015-04-08 HISTORY — DX: Disorder of urinary system, unspecified: N39.9

## 2015-04-08 HISTORY — DX: Migraine, unspecified, not intractable, without status migrainosus: G43.909

## 2015-04-08 LAB — CBC
HEMATOCRIT: 41.8 % (ref 35.0–47.0)
Hemoglobin: 13.6 g/dL (ref 12.0–16.0)
MCH: 29.8 pg (ref 26.0–34.0)
MCHC: 32.4 g/dL (ref 32.0–36.0)
MCV: 91.7 fL (ref 80.0–100.0)
PLATELETS: 222 10*3/uL (ref 150–440)
RBC: 4.56 MIL/uL (ref 3.80–5.20)
RDW: 13.2 % (ref 11.5–14.5)
WBC: 6 10*3/uL (ref 3.6–11.0)

## 2015-04-08 LAB — BASIC METABOLIC PANEL
ANION GAP: 9 (ref 5–15)
BUN: 11 mg/dL (ref 6–20)
CALCIUM: 9.6 mg/dL (ref 8.9–10.3)
CO2: 27 mmol/L (ref 22–32)
Chloride: 104 mmol/L (ref 101–111)
Creatinine, Ser: 0.58 mg/dL (ref 0.44–1.00)
Glucose, Bld: 91 mg/dL (ref 65–99)
Potassium: 4 mmol/L (ref 3.5–5.1)
Sodium: 140 mmol/L (ref 135–145)

## 2015-04-08 LAB — TYPE AND SCREEN
ABO/RH(D): A POS
ANTIBODY SCREEN: NEGATIVE

## 2015-04-08 LAB — ABO/RH: ABO/RH(D): A POS

## 2015-04-08 NOTE — Patient Instructions (Signed)
  Your procedure is scheduled on: 12/ 27/16 Tues Report to Day Surgery.2nd floor medical mall To find out your arrival time please call [phone] between 1PM - 3PM on 04/12/15 Mon  Remember: Instructions that are not followed completely may result in serious medical risk, up to and including death, or upon the discretion of your surgeon and anesthesiologist your surgery may need to be rescheduled.    _x___ 1. Do not eat food or drink liquids after midnight. No gum chewing or hard candies.     ____ 2. No Alcohol for 24 hours before or after surgery.   ____ 3. Bring all medications with you on the day of surgery if instructed.    __x__ 4. Notify your doctor if there is any change in your medical condition     (cold, fever, infections).     Do not wear jewelry, make-up, hairpins, clips or nail polish.  Do not wear lotions, powders, or perfumes. You may wear deodorant.  Do not shave 48 hours prior to surgery. Men may shave face and neck.  Do not bring valuables to the hospital.    [facility] is not responsible for any belongings or valuables.               Contacts, dentures or bridgework may not be worn into surgery.  Leave your suitcase in the car. After surgery it may be brought to your room.  For patients admitted to the hospital, discharge time is determined by your                treatment team.   Patients discharged the day of surgery will not be allowed to drive home.   Please read over the following fact sheets that you were given:      ____ Take these medicines the morning of surgery with A SIP OF WATER:    1. None  2.   3.   4.  5.  6.  ____ Fleet Enema (as directed)   ____ Use CHG Soap as directed  ____ Use inhalers on the day of surgery  ____ Stop metformin 2 days prior to surgery    ____ Take 1/2 of usual insulin dose the night before surgery and none on the morning of surgery.   ____ Stop Coumadin/Plavix/aspirin on   _x___ Stop Anti-inflammatories  on  Stop aspirin until after surgery may use Tylenol as needed   ____ Stop supplements until after surgery.    ____ Bring C-Pap to the hospital.

## 2015-04-13 ENCOUNTER — Ambulatory Visit
Admission: RE | Admit: 2015-04-13 | Discharge: 2015-04-13 | Disposition: A | Discharge status: Home / Self Care | Payer: BC Managed Care – PPO | Source: Ambulatory Visit | Attending: Obstetrics & Gynecology | Admitting: Obstetrics & Gynecology

## 2015-04-13 ENCOUNTER — Encounter
Admission: RE | Disposition: A | Discharge status: Home / Self Care | Payer: Self-pay | Source: Ambulatory Visit | Attending: Obstetrics & Gynecology

## 2015-04-13 ENCOUNTER — Ambulatory Visit: Payer: BC Managed Care – PPO | Admitting: Anesthesiology

## 2015-04-13 ENCOUNTER — Encounter: Payer: Self-pay | Admitting: *Deleted

## 2015-04-13 DIAGNOSIS — Z833 Family history of diabetes mellitus: Secondary | ICD-10-CM | POA: Diagnosis not present

## 2015-04-13 DIAGNOSIS — Z800 Family history of malignant neoplasm of digestive organs: Secondary | ICD-10-CM | POA: Diagnosis not present

## 2015-04-13 DIAGNOSIS — R51 Headache: Secondary | ICD-10-CM | POA: Insufficient documentation

## 2015-04-13 DIAGNOSIS — Z91011 Allergy to milk products: Secondary | ICD-10-CM | POA: Insufficient documentation

## 2015-04-13 DIAGNOSIS — Z79899 Other long term (current) drug therapy: Secondary | ICD-10-CM | POA: Diagnosis not present

## 2015-04-13 DIAGNOSIS — J45909 Unspecified asthma, uncomplicated: Secondary | ICD-10-CM | POA: Insufficient documentation

## 2015-04-13 DIAGNOSIS — Z841 Family history of disorders of kidney and ureter: Secondary | ICD-10-CM | POA: Diagnosis not present

## 2015-04-13 DIAGNOSIS — Z8052 Family history of malignant neoplasm of bladder: Secondary | ICD-10-CM | POA: Diagnosis not present

## 2015-04-13 DIAGNOSIS — Z8249 Family history of ischemic heart disease and other diseases of the circulatory system: Secondary | ICD-10-CM | POA: Insufficient documentation

## 2015-04-13 DIAGNOSIS — M8580 Other specified disorders of bone density and structure, unspecified site: Secondary | ICD-10-CM | POA: Insufficient documentation

## 2015-04-13 DIAGNOSIS — N939 Abnormal uterine and vaginal bleeding, unspecified: Secondary | ICD-10-CM | POA: Insufficient documentation

## 2015-04-13 DIAGNOSIS — Z8051 Family history of malignant neoplasm of kidney: Secondary | ICD-10-CM | POA: Diagnosis not present

## 2015-04-13 DIAGNOSIS — Z8042 Family history of malignant neoplasm of prostate: Secondary | ICD-10-CM | POA: Diagnosis not present

## 2015-04-13 DIAGNOSIS — Z880 Allergy status to penicillin: Secondary | ICD-10-CM | POA: Diagnosis not present

## 2015-04-13 DIAGNOSIS — N840 Polyp of corpus uteri: Secondary | ICD-10-CM | POA: Diagnosis not present

## 2015-04-13 DIAGNOSIS — N72 Inflammatory disease of cervix uteri: Secondary | ICD-10-CM | POA: Insufficient documentation

## 2015-04-13 HISTORY — PX: DILATATION & CURETTAGE/HYSTEROSCOPY WITH MYOSURE: SHX6511

## 2015-04-13 LAB — POCT PREGNANCY, URINE: PREG TEST UR: NEGATIVE

## 2015-04-13 SURGERY — DILATATION & CURETTAGE/HYSTEROSCOPY WITH MYOSURE
Anesthesia: General | Wound class: Clean Contaminated

## 2015-04-13 MED ORDER — FERRIC SUBSULFATE 259 MG/GM EX SOLN
CUTANEOUS | Status: DC | PRN
  Administered 2015-04-13: 1

## 2015-04-13 MED ORDER — LIDOCAINE HCL (CARDIAC) 20 MG/ML IV SOLN
INTRAVENOUS | Status: DC | PRN
  Administered 2015-04-13: 60 mg via INTRAVENOUS

## 2015-04-13 MED ORDER — FENTANYL CITRATE (PF) 100 MCG/2ML IJ SOLN
INTRAMUSCULAR | Status: DC | PRN
  Administered 2015-04-13 (×4): 25 ug via INTRAVENOUS

## 2015-04-13 MED ORDER — FAMOTIDINE 20 MG PO TABS
ORAL_TABLET | ORAL | Status: AC
  Administered 2015-04-13: 20 mg via ORAL
  Filled 2015-04-13: qty 1

## 2015-04-13 MED ORDER — IBUPROFEN 600 MG PO TABS: 600.0000 mg | ORAL_TABLET | Freq: Four times a day (QID) | ORAL | Status: DC | PRN

## 2015-04-13 MED ORDER — ONDANSETRON HCL 4 MG/2ML IJ SOLN: 4.0000 mg | Freq: Once | INTRAMUSCULAR | Status: DC | PRN

## 2015-04-13 MED ORDER — DEXAMETHASONE SODIUM PHOSPHATE 10 MG/ML IJ SOLN
INTRAMUSCULAR | Status: DC | PRN
  Administered 2015-04-13: 8 mg via INTRAVENOUS

## 2015-04-13 MED ORDER — KETOROLAC TROMETHAMINE 30 MG/ML IJ SOLN
INTRAMUSCULAR | Status: DC | PRN
  Administered 2015-04-13: 30 mg via INTRAVENOUS

## 2015-04-13 MED ORDER — FENTANYL CITRATE (PF) 100 MCG/2ML IJ SOLN: 25.0000 ug | INTRAMUSCULAR | Status: DC | PRN

## 2015-04-13 MED ORDER — GLYCOPYRROLATE 0.2 MG/ML IJ SOLN
INTRAMUSCULAR | Status: DC | PRN
  Administered 2015-04-13: 0.2 mg via INTRAVENOUS

## 2015-04-13 MED ORDER — MIDAZOLAM HCL 2 MG/2ML IJ SOLN
INTRAMUSCULAR | Status: DC | PRN
  Administered 2015-04-13: 2 mg via INTRAVENOUS

## 2015-04-13 MED ORDER — PROPOFOL 10 MG/ML IV BOLUS
INTRAVENOUS | Status: DC | PRN
  Administered 2015-04-13: 50 mg via INTRAVENOUS
  Administered 2015-04-13: 150 mg via INTRAVENOUS

## 2015-04-13 MED ORDER — LACTATED RINGERS IV SOLN
INTRAVENOUS | Status: DC
  Administered 2015-04-13: 07:00:00 via INTRAVENOUS

## 2015-04-13 MED ORDER — FAMOTIDINE 20 MG PO TABS
20.0000 mg | ORAL_TABLET | Freq: Once | ORAL | Status: AC
  Administered 2015-04-13: 20 mg via ORAL

## 2015-04-13 MED ORDER — ONDANSETRON HCL 4 MG/2ML IJ SOLN
INTRAMUSCULAR | Status: DC | PRN
  Administered 2015-04-13: 4 mg via INTRAVENOUS

## 2015-04-13 MED ORDER — SILVER NITRATE-POT NITRATE 75-25 % EX MISC
CUTANEOUS | Status: DC | PRN
  Administered 2015-04-13: 2

## 2015-04-13 SURGICAL SUPPLY — 18 items
ABLATOR ENDOMETRIAL MYOSURE (ABLATOR) IMPLANT
CANISTER SUC SOCK COL 7IN (MISCELLANEOUS) ×2 IMPLANT
CATH ROBINSON RED A/P 16FR (CATHETERS) ×2 IMPLANT
GLOVE BIO SURGEON STRL SZ 6 (GLOVE) ×4 IMPLANT
GLOVE INDICATOR 6.5 STRL GRN (GLOVE) ×2 IMPLANT
GOWN STRL REUS W/ TWL LRG LVL3 (GOWN DISPOSABLE) ×2 IMPLANT
GOWN STRL REUS W/TWL LRG LVL3 (GOWN DISPOSABLE) ×2
KIT RM TURNOVER CYSTO AR (KITS) ×2 IMPLANT
MYOSURE LITE POLYP REMOVAL (MISCELLANEOUS) ×2 IMPLANT
PACK DNC HYST (MISCELLANEOUS) ×2 IMPLANT
PAD GROUND ADULT SPLIT (MISCELLANEOUS) ×2 IMPLANT
PAD OB MATERNITY 4.3X12.25 (PERSONAL CARE ITEMS) ×2 IMPLANT
PAD PREP 24X41 OB/GYN DISP (PERSONAL CARE ITEMS) ×2 IMPLANT
SEAL ROD LENS SCOPE MYOSURE (ABLATOR) ×2 IMPLANT
SOL .9 NS 3000ML IRR  AL (IV SOLUTION) ×1
SOL .9 NS 3000ML IRR UROMATIC (IV SOLUTION) ×1 IMPLANT
TUBING CONNECTING 10 (TUBING) ×2 IMPLANT
TUBING HYSTEROSCOPY DOLPHIN (MISCELLANEOUS) ×2 IMPLANT

## 2015-04-13 NOTE — Transfer of Care (Signed)
Immediate Anesthesia Transfer of Care Note  Patient: Jeannette CorpusSharon Heckel  Procedure(s) Performed: Procedure(s): DILATATION & CURETTAGE/HYSTEROSCOPY WITH MYOSURE/POLYPECTOMY (N/A)  Patient Location: PACU  Anesthesia Type:General  Level of Consciousness: sedated  Airway & Oxygen Therapy: Patient Spontanous Breathing and Patient connected to face mask oxygen  Post-op Assessment: Report given, VSS   Post vital signs: Reviewed and stable  Last Vitals:  Filed Vitals:   04/13/15 0631 04/13/15 0847  BP: 123/76 125/79  Pulse: 63   Temp: 36.6 C 36.2 C  Resp: 16     Complications: No apparent anesthesia complications

## 2015-04-13 NOTE — H&P (Signed)
H&P Update  PLEASE SEE PAPER H&P  Pt was last seen in my office, and complete history and physical performed.  The surgical history has been reviewed and remains accurate without interval change. The patient was re-examined and patient's physiologic condition has not changed significantly in the last 30 days.  No new pharmacological allergies or types of therapy has been initiated.  Allergies  Allergen Reactions  . Lac Bovis Other (See Comments)  . Milk-Related Compounds   . Penicillins     Past Medical History  Diagnosis Date  . Frequent headaches   . Migraines   . UTI (lower urinary tract infection)   . Kidney stones   . Cystitis   . Osteopenia   . Endometriosis   . Microscopic hematuria   . Gross hematuria   . Cystocele   . Migraine   . Urinary disorder    Past Surgical History  Procedure Laterality Date  . Gum surgery    . Laproscopy  2007  . Cystoscopy  2007    with biopsy   . Colonoscopy N/A 11/05/2014    Procedure: COLONOSCOPY;  Surgeon: Scot Junobert T Elliott, MD;  [facility];  Service: Endoscopy;  Laterality: N/A;    BP 123/76 mmHg  Pulse 63  Temp(Src) 97.9 F (36.6 C) (Oral)  Resp 16  Ht 5\' 10"  (1.778 m)  Wt 68.04 kg (150 lb)  BMI 21.52 kg/m2  SpO2 100%  NAD RRR no murmurs CTAB, no wheezing, resps unlabored +BS, soft, NTTP No c/c/e Pelvic exam deferred  The above history was confirmed with the patient. The condition still exists that makes this procedure necessary. Surgical plan includes hysteroscopy D&C,  as confirmed on the consent. The treatment plan remains the same, without new options for care.  The patient understands the potential benefits and risks and the consents have been signed and placed on the chart.     Ranae Plumberhelsea Ellyson Rarick, MD Attending Obstetrician Gynecologist [facility]

## 2015-04-13 NOTE — Anesthesia Preprocedure Evaluation (Signed)
Anesthesia Evaluation  Patient identified by MRN, date of birth, ID band Patient awake    Reviewed: Allergy & Precautions, NPO status , Patient's Chart, lab work & pertinent test results, reviewed documented beta blocker date and time   Airway Mallampati: II  TM Distance: >3 FB     Dental  (+) Chipped   Pulmonary           Cardiovascular      Neuro/Psych  Headaches,    GI/Hepatic   Endo/Other    Renal/GU Renal InsufficiencyRenal disease     Musculoskeletal   Abdominal   Peds  Hematology   Anesthesia Other Findings   Reproductive/Obstetrics                             Anesthesia Physical Anesthesia Plan  ASA: II  Anesthesia Plan: General   Post-op Pain Management:    Induction: Intravenous  Airway Management Planned: LMA  Additional Equipment:   Intra-op Plan:   Post-operative Plan:   Informed Consent: I have reviewed the patients History and Physical, chart, labs and discussed the procedure including the risks, benefits and alternatives for the proposed anesthesia with the patient or authorized representative who has indicated his/her understanding and acceptance.     Plan Discussed with: CRNA  Anesthesia Plan Comments:         Anesthesia Quick Evaluation

## 2015-04-13 NOTE — Discharge Instructions (Signed)
AMBULATORY SURGERY  DISCHARGE INSTRUCTIONS   1) The drugs that you were given will stay in your system until tomorrow so for the next 24 hours you should not:  A) Drive an automobile B) Make any legal decisions C) Drink any alcoholic beverage   2) You may resume regular meals tomorrow.  Today it is better to start with liquids and gradually work up to solid foods.  You may eat anything you prefer, but it is better to start with liquids, then soup and crackers, and gradually work up to solid foods.   3) Please notify your doctor immediately if you have any unusual bleeding, trouble breathing, redness and pain at the surgery site, drainage, fever, or pain not relieved by medication. 4)   5) Your post-operative visit with Dr.                                     is: Date:                        Time:    Please call to schedule your post-operative visit.  6) Additional Instructions:     Hysteroscopy, Care After Refer to this sheet in the next few weeks. These instructions provide you with information on caring for yourself after your procedure. Your health care provider may also give you more specific instructions. Your treatment has been planned according to current medical practices, but problems sometimes occur. Call your health care provider if you have any problems or questions after your procedure.  WHAT TO EXPECT AFTER THE PROCEDURE After your procedure, it is typical to have the following: You may have some cramping. This normally lasts for a couple days. You may have bleeding. This can vary from light spotting for a few days to menstrual-like bleeding for 3-7 days. HOME CARE INSTRUCTIONS Rest for the first 1-2 days after the procedure. Only take over-the-counter or prescription medicines as directed by your health care provider. Do not take aspirin. It can increase the chances of bleeding. Take showers instead of baths for 2 weeks or as directed by your health care  provider. Do not drive for 24 hours or as directed. Do not drink alcohol while taking pain medicine. Do not use tampons, douche, or have sexual intercourse for 2 weeks or until your health care provider says it is okay. Take your temperature twice a day for 4-5 days. Write it down each time. Follow your health care provider's advice about diet, exercise, and lifting. If you develop constipation, you may: Take a mild laxative if your health care provider approves. Add bran foods to your diet. Drink enough fluids to keep your urine clear or pale yellow. Try to have someone with you or available to you for the first 24-48 hours, especially if you were given a general anesthetic. Follow up with your health care provider as directed. SEEK MEDICAL CARE IF: You feel dizzy or lightheaded. You feel sick to your stomach (nauseous). You have abnormal vaginal discharge. You have a rash. You have pain that is not controlled with medicine. SEEK IMMEDIATE MEDICAL CARE IF: You have bleeding that is heavier than a normal menstrual period. You have a fever. You have increasing cramps or pain, not controlled with medicine. You have new belly (abdominal) pain. You pass out. You have pain in the tops of your shoulders (shoulder strap areas). You have shortness  of breath.   This information is not intended to replace advice given to you by your health care provider. Make sure you discuss any questions you have with your health care provider.   Document Released: 01/22/2013 Document Reviewed: 01/22/2013 Elsevier Interactive Patient Education [employer]2016 [employer]

## 2015-04-13 NOTE — Anesthesia Postprocedure Evaluation (Signed)
Anesthesia Post Note  Patient: Cristina Wade  Procedure(s) Performed: Procedure(s) (LRB): DILATATION & CURETTAGE/HYSTEROSCOPY WITH MYOSURE/POLYPECTOMY (N/A)  Patient location during evaluation: PACU Anesthesia Type: General Level of consciousness: awake Pain management: pain level controlled Vital Signs Assessment: post-procedure vital signs reviewed and stable Respiratory status: spontaneous breathing Cardiovascular status: blood pressure returned to baseline Anesthetic complications: no    Last Vitals:  Filed Vitals:   04/13/15 1020 04/13/15 1058  BP: 127/87 110/63  Pulse: 61 62  Temp: 36.8 C 36.8 C  Resp: 14 14    Last Pain:  Filed Vitals:   04/13/15 1100  PainSc: 1                  [name] S

## 2015-04-14 LAB — SURGICAL PATHOLOGY

## 2015-04-15 ENCOUNTER — Telehealth: Payer: Self-pay | Admitting: Obstetrics and Gynecology

## 2015-04-15 NOTE — Telephone Encounter (Signed)
Pt called the office with some LE numbness and tingling and belly pain.  I called her and she states that both are getting better and no fevers, chills, n/v. I told her that she should be getting better day by day and if not, and has any of the above s/s, to come to the ER for evaluation.

## 2015-04-23 NOTE — Op Note (Signed)
Operative Report Hysteroscopy, Dilation and Curettage 04/13/2015  Patient:  Cristina CorpusSharon Taul  45 y.o. female Preoperative diagnosis:  abnormal uterine bleeding,endometrial polyp Postoperative diagnosis:  abnormal uterine bleeding, endometrial polyp  PROCEDURE:  Procedure(s): DILATATION & CURETTAGE/HYSTEROSCOPY WITH MYOSURE/POLYPECTOMY (N/A) Surgeon:  Surgeon(s) and Role:    * Rawson Minix Salena Saner Murtaza Shell, MD - Primary Assistant: n/a Anesthesia:  LMA I/O:  500 in, no UOP, 10cc EBL Specimens:  Endometrial curettings Complications: None Apparent Disposition:  VS stable to PACU  Findings: Uterus, mobile, normal size, sounding to 8 cm; normal cervix, vagina, perineum.  Two small polyps within the endometrium, one free floating, and one with thin base at anterior left.  Indication for procedure/Consents: 45 y.o. No obstetric history on file.  here for scheduled surgery for the aforementioned diagnoses.  Risks of surgery were discussed with the patient including but not limited to: bleeding which may require transfusion; infection which may require antibiotics; injury to uterus or surrounding organs; intrauterine scarring which may impair future fertility; need for additional procedures including laparotomy or laparoscopy; and other postoperative/anesthesia complications. Written informed consent was obtained.    Procedure Details:   The patient was then taken to the operating room where anesthesia was administered and was found to be adequate.  After a formal and adequate timeout was performed, she was placed in the dorsal lithotomy position and examined with the above findings. She was then prepped and draped in the sterile manner.  A speculum was then placed in the patient's vagina and a single tooth tenaculum was applied to the anterior lip of the cervix.  A paracervical block was placed.  The uterus was sounded to 8cm. Her cervix was serially dilated to accommodate the myoscope with findings as above.  The  myosure device was employed to eliminate both polyps. A sharp curettage was then performed until there was a gritty texture in all four quadrants. The specimen(s) was handed off to nursing.  The camera was reinserted and confirmed the uterus had been evacuated. The tenaculum was removed from the anterior lip of the cervix and the vaginal speculum was removed after noting good hemostasis. The patient tolerated the procedure well and was taken to the recovery area awake, extubated and in stable condition.  The patient will be discharged to home as per PACU criteria.  Routine postoperative instructions given. She will follow up in the clinic in two to four weeks for postoperative evaluation.  Ranae Plumberhelsea Elbert Spickler, MD [facility] Attending Gynecologist

## 2015-05-13 ENCOUNTER — Ambulatory Visit (INDEPENDENT_AMBULATORY_CARE_PROVIDER_SITE_OTHER): Payer: BC Managed Care – PPO | Admitting: Nurse Practitioner

## 2015-05-13 ENCOUNTER — Encounter: Payer: Self-pay | Admitting: Nurse Practitioner

## 2015-05-13 VITALS — BP 104/68 | HR 94 | Temp 98.3°F | Resp 16 | Ht 70.0 in | Wt 153.4 lb

## 2015-05-13 DIAGNOSIS — J069 Acute upper respiratory infection, unspecified: Secondary | ICD-10-CM

## 2015-05-13 MED ORDER — PREDNISONE 10 MG PO TABS: ORAL_TABLET | ORAL | Status: DC

## 2015-05-13 MED ORDER — AZITHROMYCIN 250 MG PO TABS: ORAL_TABLET | ORAL | Status: DC

## 2015-05-13 NOTE — Patient Instructions (Signed)
The prednisone was sent to [facility].   Printed prescription for Z-pack- take if you don't feel better in 2-3 days on the prednisone.  Prednisone with breakfast or lunch at the latest.  6 tablets on day 1, 5 tablets on day 2, 4 tablets on day 3, 3 tablets on day 4, 2 tablets day 5, 1 tablet on day 6...done! Take tablets all together not spaced out Don't take with NSAIDs (Ibuprofen, Aleve, Naproxen, Meloxicam ect...)

## 2015-05-13 NOTE — Progress Notes (Signed)
Patient ID: Cristina Wade, female    DOB: Apr 12, 1971  Age: 45 y.o. MRN: [id number]  CC: Cough and Sinusitis   HPI Cristina Wade presents for Sinusitis and cough x 2 days.   1) Cough- yellow/green productive  Frontal pressure  Denies sick contacts Hoarse  Zyrtec and decongestant Came on abruptly   History Cristina Wade has a past medical history of Frequent headaches; Migraines; UTI (lower urinary tract infection); Kidney stones; Cystitis; Osteopenia; Endometriosis; Microscopic hematuria; Gross hematuria; Cystocele; Migraine; and Urinary disorder.   She has past surgical history that includes Gum surgery; laproscopy (2007); Cystoscopy (2007); Colonoscopy (N/A, 11/05/2014); and Dilatation & curettage/hysteroscopy with myosure (N/A, 04/13/2015).   Her family history includes Arthritis in her father and mother; Cancer in her father, maternal grandmother, and paternal grandfather; Colon cancer in her father; Diabetes in her mother; Heart disease in her mother; Hyperlipidemia in her father; Hypertension in her mother; Kidney disease in her mother; Stroke in her mother; Transient ischemic attack in her father.She reports that she has never smoked. She has never used smokeless tobacco. She reports that she does not drink alcohol or use illicit drugs.  Outpatient Prescriptions Prior to Visit  Medication Sig Dispense Refill  . Calcium-Vitamin D 600-200 MG-UNIT per tablet Take 1 tablet by mouth.    . levonorgestrel-ethinyl estradiol (SEASONALE,INTROVALE,JOLESSA) 0.15-0.03 MG tablet Take 1 tablet by mouth.    . Multiple Vitamin (MULTI-VITAMINS) TABS Take 1 tablet by mouth.    . Cholecalciferol (VITAMIN D-3 PO) Take 1 capsule by mouth daily. Reported on 05/13/2015    . Cyanocobalamin (RA VITAMIN B-12 TR) 1000 MCG TBCR Take 1 tablet by mouth. Reported on 05/13/2015    . ibuprofen (ADVIL,MOTRIN) 600 MG tablet Take 1 tablet (600 mg total) by mouth every 6 (six) hours as needed. (Patient not taking: Reported on  05/13/2015) 30 tablet 0   No facility-administered medications prior to visit.    ROS Review of Systems  Constitutional: Negative for fever, chills, diaphoresis and fatigue.  HENT: Positive for congestion, ear pain and sinus pressure. Negative for postnasal drip and rhinorrhea.   Eyes: Negative for visual disturbance.  Respiratory: Positive for cough and chest tightness. Negative for shortness of breath and wheezing.   Cardiovascular: Negative for chest pain, palpitations and leg swelling.  Gastrointestinal: Negative for nausea, vomiting and diarrhea.  Musculoskeletal: Positive for myalgias.  Neurological: Positive for headaches.    Objective:  BP 104/68 mmHg  Pulse 94  Temp(Src) 98.3 F (36.8 C) (Oral)  Resp 16  Ht  (1.778 m)  Wt 153 lb 6.4 oz (69.582 kg)  BMI 22.01 kg/m2  SpO2 97%  Physical Exam  Constitutional: She is oriented to person, place, and time. She appears well-developed and well-nourished. No distress.  HENT:  Head: Normocephalic and atraumatic.  Right Ear: External ear normal.  Left Ear: External ear normal.  Mouth/Throat: No oropharyngeal exudate.  TM's injected bilaterally, no bulging or retraction  Eyes: EOM are normal. Pupils are equal, round, and reactive to light. Right eye exhibits no discharge. Left eye exhibits no discharge. No scleral icterus.  Neck: Normal range of motion. Neck supple.  Cardiovascular: Normal rate, regular rhythm and normal heart sounds.  Exam reveals no gallop and no friction rub.   No murmur heard. Pulmonary/Chest: Effort normal and breath sounds normal. No respiratory distress. She has no wheezes. She has no rales. She exhibits no tenderness.  Lymphadenopathy:    She has no cervical adenopathy.  Neurological: She is alert and oriented to  person, place, and time. No cranial nerve deficit. She exhibits normal muscle tone. Coordination normal.  Skin: Skin is warm and dry. No rash noted. She is not diaphoretic.  Psychiatric:  She has a normal mood and affect. Her behavior is normal. Judgment and thought content normal.    Assessment & Plan:   Cristina Wade was seen today for cough and sinusitis.  Diagnoses and all orders for this visit:  Acute URI  Other orders -     predniSONE (DELTASONE) 10 MG tablet; Take 6 tablets by mouth on day 1 then decrease by 1 tablet each day until gone. -     Discontinue: azithromycin (ZITHROMAX) 250 MG tablet; Take 2 tablets by mouth on day 1, take 1 tablet by mouth each day after for 4 days. -     azithromycin (ZITHROMAX) 250 MG tablet; Take 2 tablets by mouth on day 1, take 1 tablet by mouth each day after for 4 days.  I am having Cristina Wade start on predniSONE. I am also having her maintain her Calcium-Vitamin D, Cyanocobalamin, MULTI-VITAMINS, levonorgestrel-ethinyl estradiol, Cholecalciferol (VITAMIN D-3 PO), ibuprofen, and azithromycin.  Meds ordered this encounter  Medications  . predniSONE (DELTASONE) 10 MG tablet    Sig: Take 6 tablets by mouth on day 1 then decrease by 1 tablet each day until gone.    Dispense:  21 tablet    Refill:  0    Order Specific Question:  Supervising Provider    Answer:  Duncan Dull L [[id number]]  . DISCONTD: azithromycin (ZITHROMAX) 250 MG tablet    Sig: Take 2 tablets by mouth on day 1, take 1 tablet by mouth each day after for 4 days.    Dispense:  6 each    Refill:  0    Order Specific Question:  Supervising Provider    Answer:  Duncan Dull L [[id number]]  . azithromycin (ZITHROMAX) 250 MG tablet    Sig: Take 2 tablets by mouth on day 1, take 1 tablet by mouth each day after for 4 days.    Dispense:  6 each    Refill:  0    Order Specific Question:  Supervising Provider    Answer:  Sherlene Shams [[id number]]     Follow-up: Return if symptoms worsen or fail to improve.

## 2015-05-17 ENCOUNTER — Encounter: Payer: Self-pay | Admitting: Urology

## 2015-05-17 ENCOUNTER — Ambulatory Visit (INDEPENDENT_AMBULATORY_CARE_PROVIDER_SITE_OTHER): Payer: BC Managed Care – PPO | Admitting: Urology

## 2015-05-17 VITALS — BP 116/65 | HR 73 | Ht 70.0 in | Wt 153.0 lb

## 2015-05-17 DIAGNOSIS — J069 Acute upper respiratory infection, unspecified: Secondary | ICD-10-CM | POA: Insufficient documentation

## 2015-05-17 DIAGNOSIS — N8184 Pelvic muscle wasting: Secondary | ICD-10-CM

## 2015-05-17 DIAGNOSIS — M6289 Other specified disorders of muscle: Secondary | ICD-10-CM

## 2015-05-17 NOTE — Assessment & Plan Note (Signed)
New onset Will treat conservatively due to probable viral nature Prednisone taper to decrease inflammation given  Instructions for taking done verbally and on AVS Flonase was sent to pharmacy  Mucinex plain and benadryl at night encouraged  Antibiotic prescription printed/sent to pharmacy in case of no improvement or fever within 48 hours. Pt verbalized understanding of need to hold.  FU prn worsening/failure to improve.

## 2015-05-17 NOTE — Progress Notes (Signed)
45 year old white female who presents today in follow-up after undergoing urodynamic evaluation. The patient has to strain to void, has incomplete bladder emptying, has a weak stream starts and stops. She does not have a history of recurrent urinary tract infections. She does have a history of severe constipation. She is not sexually active, but does complain of some pain with defecation. She does have a history of microscopic hematuria which has been thoroughly evaluated. She is recently also had a D&C of her uterus for abnormal bleeding. She's also had a colonoscopy.  Urodynamics, performed 05/02/14: The patient's postvoid residual was approximately 350 mL. The patient's bladder capacity was 638 mL's with normal compliance. The bladder was unstable at very large volumes. There was no evidence of urge incontinence. There was mild stress incontinence. The patient was able to generate a detrusor pressure of 29 cm H2O which was unsustained. Her EMG demonstrated increased activity with poor relaxation of the external sphincter. 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 She also has mild hyper urethral mobility resulting in stress incontinence.  Current Outpatient Prescriptions on File Prior to Visit  Medication Sig Dispense Refill  . azithromycin (ZITHROMAX) 250 MG tablet Take 2 tablets by mouth on day 1, take 1 tablet by mouth each day after for 4 days. 6 each 0  . Multiple Vitamin (MULTI-VITAMINS) TABS Take 1 tablet by mouth.    . predniSONE (DELTASONE) 10 MG tablet Take 6 tablets by mouth on day 1 then decrease by 1 tablet each day until gone. 21 tablet 0   No current facility-administered medications on file prior to visit.   Past Medical History  Diagnosis Date  . Frequent headaches   . Migraines   . UTI (lower urinary tract infection)   . Kidney stones   . Cystitis   . Osteopenia   . Endometriosis   . Microscopic hematuria    . Gross hematuria   . Cystocele   . Migraine   . Urinary disorder    Social History  Substance Use Topics  . Smoking status: Never Smoker   . Smokeless tobacco: Never Used  . Alcohol Use: No   The patient has a family history of  PE Filed Vitals:   05/17/15 1444  BP: 116/65  Pulse: 73   NAD  UA: pending UDS: I independently reviewed the patient's urodynamic test, the findings are dictated in her history of present illness.  Impression: The patient is a dysfunctional voider/dysfunctional elimination syndrome. Recommendations: I recommended the patient see pelvic floor physical therapy for biofeedback and techniques to help reduce her pseudo-detrusor sphincter dyssynergia as well as her dysfunctional illumination syndrome. We'll plan to follow up with the patient in 3 months after she has completed her physical therapy.

## 2015-05-26 ENCOUNTER — Encounter: Payer: Self-pay | Admitting: Physical Therapy

## 2015-05-26 ENCOUNTER — Ambulatory Visit: Payer: BC Managed Care – PPO | Attending: Urology | Admitting: Physical Therapy

## 2015-05-26 DIAGNOSIS — N8184 Pelvic muscle wasting: Secondary | ICD-10-CM | POA: Insufficient documentation

## 2015-05-26 DIAGNOSIS — M6289 Other specified disorders of muscle: Secondary | ICD-10-CM

## 2015-05-26 DIAGNOSIS — R279 Unspecified lack of coordination: Secondary | ICD-10-CM | POA: Diagnosis present

## 2015-05-26 DIAGNOSIS — R269 Unspecified abnormalities of gait and mobility: Secondary | ICD-10-CM | POA: Diagnosis present

## 2015-05-26 NOTE — Patient Instructions (Signed)
1. Open Book ( Handout)                                                  2. Preserve the function of your pelvic floor, abdomen, and back.              Avoid decreased straining of abdominal/pelvic floor muscles with less slouching,  holding your breath with lifting/bowel movements)                                                     FUNCTIONAL POSTURES

## 2015-05-27 NOTE — Therapy (Signed)
[place] [facility] MAIN [facility] SERVICES [address] [place], [place], [address] Phone: [phone]   Fax:  [phone]  Physical Therapy Evaluation  Patient Details  Name: Cristina Wade MRN: [id number] Date of Birth: 31-Dec-1970 Referring Provider: Marlou Porch   Encounter Date: 05/26/2015      PT End of Session - 05/27/15 2306    Visit Number 1   Number of Visits 12   Date for PT Re-Evaluation 08/18/15   PT Start Time 1610   PT Stop Time 1710   PT Time Calculation (min) 60 min   Activity Tolerance Patient tolerated treatment well;No increased pain   Behavior During Therapy [facility] for tasks assessed/performed      Past Medical History  Diagnosis Date  . Frequent headaches   . Migraines   . UTI (lower urinary tract infection)   . Kidney stones   . Cystitis   . Osteopenia   . Endometriosis   . Microscopic hematuria   . Gross hematuria   . Cystocele   . Migraine   . Urinary disorder     Past Surgical History  Procedure Laterality Date  . Gum surgery    . Laproscopy  2007  . Cystoscopy  2007    with biopsy   . Colonoscopy N/A 11/05/2014    Procedure: COLONOSCOPY;  Surgeon: Scot Jun, MD;  Location: [facility] ENDOSCOPY;  Service: Endoscopy;  Laterality: N/A;  . Dilatation & curettage/hysteroscopy with myosure N/A 04/13/2015    Procedure: DILATATION & CURETTAGE/HYSTEROSCOPY WITH MYOSURE/POLYPECTOMY;  Surgeon: Elenora Fender Ward, MD;  Location: [facility] ORS;  Service: Gynecology;  Laterality: N/A;    There were no vitals filed for this visit.  Visit Diagnosis:  Pelvic floor dysfunction  Lack of coordination  Gait abnormality      Subjective Assessment - 05/27/15 2316    Subjective Pt reported difficulty with initiating urination which has lead to straining with urination. This symptom has been getting worse over the past 6 months.  Hx of D & C in 03/2015 secondary colyps in uterus. Pt underwent colonscopy July 2016 which showed that she had  internal hemorrhoids. Constipation: bowel movement twice a week. Stool type 1-2.     Patient is accompained by: Family member  twin sister   Pertinent History Pt stands 6 hr at work.             [facility] PT Assessment - 05/27/15 2302    Assessment   Medical Diagnosis pelvic floor dysfunction    Referring Provider Marlou Porch    Precautions   Precautions None   Restrictions   Weight Bearing Restrictions No   Balance Screen   Has the patient fallen in the past 6 months No   Observation/Other Assessments   Observations legs crossed in chair    Coordination   Gross Motor Movements are Fluid and Coordinated --  chest breathing, very l imited diaphragmatic excursion    Fine Motor Movements are Fluid and Coordinated --  abdominal straining w/ cue for bowel movement    Coordination and Movement Description no pelvic floor ROM w/ breathing nor functional cues   Posture/Postural Control   Posture Comments lumbopelvic instability w/ hip flex   Palpation   SI assessment  R iliac crest higher. Leg length difference to measure at next session and address with heel lift   Palpation comment midback mm tension , upper trap tensions L > R    Bed Mobility   Bed Mobility --  half crunch  Ambulation/Gait   Gait Comments L lateral lean  , decreased stance phase on R                  Pelvic Floor Special Questions - 05/27/15 2303    Diastasis Recti neg   External Palpation through clothing: noted increased mm tensions R > L posterior mm, L> R anterior mm            [facility] Adult PT Treatment/Exercise - 05/27/15 2302    Self-Care   Self-Care --  POC, anatomy, physiology, goals, goals   Neuro Re-ed    Neuro Re-ed Details  decrease forces on pelvic floor (see pt instruction)    Exercises   Exercises --  open book    Manual Therapy   Manual therapy comments thoracic, scapular release sidelying                PT Education - 05/26/15 1716    Education provided Yes    Education Details HEP, POC, anatomy & physiology, goals     Person(s) Educated Patient   Methods Explanation;Demonstration;Tactile cues;Verbal cues;Handout   Comprehension Verbalized understanding;Returned demonstration             PT Long Term Goals - 05/26/15 1634    PT LONG TERM GOAL #1   Title Pt will report increased bowel movements from 2x/ week to 3-5x/ week in order to restore bowel function.    Time 12   Period Weeks   Status New   PT LONG TERM GOAL #2   Title Pt will report Bristol Type Stool Scale type 4 for 50% which will be an improvement from Type 1-2 in order to decrease constipation.    Time 12   Period Weeks   Status New   PT LONG TERM GOAL #3   Title Pt will report being able to initate urination with < 3 sec delay in order to perform ADLs.    Time 12   Period Weeks   Status New   PT LONG TERM GOAL #4   Title Pt will decrease her score on NIH-CPSI from 51% to < 31% in order to demo improved pelvic floor function and QOL.   Time 12   Period Weeks   Status New               Plan - 05/27/15 2308    Clinical Impression Statement Pt is a 45 yo female who c/o constipation and  difficulty w.  initiating urination which have interefered with her QOL.  Pt's clinical presentations include hyperactivity of her pelvic floor, leg length discrepancy, gait deviations, increased spinal mm tensions, upper trap dominant with chest breathing,  poor coordination of pelvic floor ROM, poor core stability, and poor sitting and toileting mechanics. Pt's personal factors include increased   UE use with lifting, pulling, and pushing at work as a Designer, jewellery  at [employer], history of constipation, gynecological procedures, and Hx  of GI issues (polyps, internal hemorrhoids).  With these factors combined,  pt 's condition is moderate in complexity and is evolving.   Pt will benefit from skilled therapeutic intervention in order to improve on the following deficits  Abnormal gait;Decreased balance;Decreased safety awareness;Difficulty walking;Impaired flexibility;Decreased activity tolerance;Decreased endurance;Decreased range of motion;Decreased strength;Hypomobility;Decreased coordination;Decreased mobility;Increased muscle spasms;Postural dysfunction;Improper body mechanics;Decreased cognition   Rehab Potential Good   PT Frequency 1x / week   PT Duration 12 weeks   PT Treatment/Interventions ADLs/Self Care Home Management;Aquatic Therapy;Electrical Stimulation;Cryotherapy;Gait training;Traction;Moist  Heat;Stair training;Functional mobility training;Therapeutic activities;Therapeutic exercise;Balance training;Neuromuscular re-education;Manual techniques;Patient/family education;Scar mobilization;Passive range of motion;Energy conservation;Taping   Consulted and Agree with Plan of Care Patient;Family member/caregiver         Problem List Patient Active Problem List   Diagnosis Date Noted  . Acute URI 05/17/2015  . Hematuria, microscopic 11/19/2014  . Microscopic hematuria 10/22/2014  . Straining during urination 10/22/2014  . Encounter to establish care 09/28/2014  . Vestibular migraine 09/28/2014  . Excessive cerumen in both ear canals 09/28/2014  . Encounter for screening colonoscopy 11/21/2013    Mariane Masters ,PT, DPT, E-RYT  05/27/2015, 11:22 PM  [facility] MAIN [facility] SERVICES [address] [place], [place], [address] Phone: [phone]   Fax:  [phone]  Name: Cristina Wade MRN: [id number] Date of Birth: February 12, 1971

## 2015-06-22 ENCOUNTER — Ambulatory Visit: Payer: BC Managed Care – PPO | Attending: Urology | Admitting: Physical Therapy

## 2015-06-22 DIAGNOSIS — R279 Unspecified lack of coordination: Secondary | ICD-10-CM | POA: Insufficient documentation

## 2015-06-22 DIAGNOSIS — R269 Unspecified abnormalities of gait and mobility: Secondary | ICD-10-CM | POA: Diagnosis present

## 2015-06-22 DIAGNOSIS — M6289 Other specified disorders of muscle: Secondary | ICD-10-CM

## 2015-06-22 DIAGNOSIS — N8184 Pelvic muscle wasting: Secondary | ICD-10-CM | POA: Diagnosis present

## 2015-06-22 NOTE — Patient Instructions (Signed)
  Hip and pelvic floor stretches    Seated:  5 breaths each side  Figure 4  Cross over    Hands and knees [postion :  Childs pose rocking   5x    On back:  Happy baby     Reverse Kegel - lengthen the pelvic floor with inhalation  *(Handout)

## 2015-06-24 NOTE — Therapy (Signed)
[facility] [facility] MAIN [facility] [address] [place], [place], [address] Phone: [phone]   Fax:  [phone]  Physical Therapy Treatment  Patient Details  Name: Cristina CorpusSharon Kloehn MRN: [id number] Date of Birth: [birth year] Referring Provider: Marlou PorchHerrick   Encounter Date: 06/22/2015      PT End of Session - 06/24/15 0016    Visit Number 2   Number of Visits 12   Date for PT Re-Evaluation 08/18/15   PT Start Time 1500   PT Stop Time 1600   PT Time Calculation (min) 60 min   Activity Tolerance Patient tolerated treatment well;No increased pain   Behavior [facility] for tasks assessed/performed      Past Medical History  Diagnosis Date  . Frequent headaches   . Migraines   . UTI (lower urinary tract infection)   . Kidney stones   . Cystitis   . Osteopenia   . Endometriosis   . Microscopic hematuria   . Gross hematuria   . Cystocele   . Migraine   . Urinary disorder     Past Surgical History  Procedure Laterality Date  . Gum surgery    . Laproscopy  2007  . Cystoscopy  2007    with biopsy   . Colonoscopy N/A 11/05/2014    Procedure: COLONOSCOPY;  Surgeon: Scot Junobert T Elliott, MD;  [facility];  Service: Endoscopy;  Laterality: N/A;  . Dilatation & curettage/hysteroscopy with myosure N/A 04/13/2015    Procedure: DILATATION & CURETTAGE/HYSTEROSCOPY WITH MYOSURE/POLYPECTOMY;  Surgeon: Elenora Fenderhelsea C Ward, MD;  Location: [facility] ORS;  Service: Gynecology;  Laterality: N/A;    There were no vitals filed for this visit.  Visit Diagnosis:  Pelvic floor dysfunction  Lack of coordination  Gait abnormality          [facility] Assessment - 06/24/15 0014    Observation/Other Assessments   Observations leg length difference (malleoli to ASIS) L shorter than R by 3/4"                   Pelvic Floor Special Questions - 06/24/15 0012    Pelvic Floor Internal Exam --  pt consented verbally without contraindications   Exam Type Rectal   Palpation increased mm tension at EAS, puborectalis (posterior L, and anterior R )            [facility] Adult PT Treatment/Exercise - 06/24/15 0014    Therapeutic Activites    ADL's fitted pt with shoe lift for 3/4" in L shoe    Neuro Re-ed    Neuro Re-ed Details  pelvic floor lengthening. relaxation , stretches                 PT Education - 06/24/15 0016    Education provided Yes   Education Details HEP   Person(s) Educated Patient   Methods Explanation;Demonstration;Tactile cues;Verbal cues;Handout   Comprehension Returned demonstration;Verbalized understanding             PT Long Term Goals - 05/26/15 1634    PT LONG TERM GOAL #1   Title Pt will report increased bowel movements from 2x/ week to 3-5x/ week in order to restore bowel function.    Time 12   Period Weeks   Status New   PT LONG TERM GOAL #2   Title Pt will report Bristol Type Stool Scale type 4 for 50% which will be an improvement from Type 1-2 in order to decrease constipation.    Time  12   Period Weeks   Status New   PT LONG TERM GOAL #3   Title Pt will report being able to initate urination with < 3 sec delay in order to perform ADLs.    Time 12   Period Weeks   Status New   PT LONG TERM GOAL #4   Title Pt will decrease her score on NIH-CPSI from 51% to < 31% in order to demo improved pelvic floor function and QOL.   Time 12   Period Weeks   Status New               Plan - 06/24/15 0018    Clinical Impression Statement Pt is progressing well with decreased pelvic floor mm tensions after intrarectal manual Tx. Pt demo'd increased pelvic floor lengthening. Fitted pt with shoe lift for 3/4" in L shoe. Pt reported feeling more comfort compared to her orthotics. Pt  demo'd  slightly less lateral sway B after fitting of shoe lift of 3/4" in L shoe and PT plans to address possible hip weakness. Pt will continue to benefit from skilled PT.    Pt will benefit from skilled  therapeutic intervention in order to improve on the following deficits Abnormal gait;Decreased balance;Decreased safety awareness;Difficulty walking;Impaired flexibility;Decreased activity tolerance;Decreased endurance;Decreased range of motion;Decreased strength;Hypomobility;Decreased coordination;Decreased mobility;Increased muscle spasms;Postural dysfunction;Improper body mechanics;Decreased cognition   Rehab Potential Good   PT Frequency 1x / week   PT Duration 12 weeks   PT Treatment/Interventions ADLs/Self Care Home Management;Aquatic Therapy;Electrical Stimulation;Cryotherapy;Gait training;Traction;Moist Heat;Stair training;Functional mobility training;Therapeutic activities;Therapeutic exercise;Balance training;Neuromuscular re-education;Manual techniques;Patient/family education;Scar mobilization;Passive range of motion;Energy conservation;Taping   Consulted and Agree with Plan of Care Patient;Family member/caregiver        Problem List Patient Active Problem List   Diagnosis Date Noted  . Acute URI 05/17/2015  . Hematuria, microscopic 11/19/2014  . Microscopic hematuria 10/22/2014  . Straining during urination 10/22/2014  . Encounter to establish care 09/28/2014  . Vestibular migraine 09/28/2014  . Excessive cerumen in both ear canals 09/28/2014  . Encounter for screening colonoscopy 11/21/2013    Mariane Masters ,PT, DPT, E-RYT  06/24/2015, 12:19 AM   [facility] MAIN [facility] SERVICES [address] [place], [place], [address] Phone: [phone]   Fax:  [phone]  Name: Trudi Morgenthaler MRN: [id number] Date of Birth: 10-Feb-1971

## 2015-06-29 ENCOUNTER — Ambulatory Visit: Payer: BC Managed Care – PPO | Admitting: Physical Therapy

## 2015-06-29 DIAGNOSIS — M6289 Other specified disorders of muscle: Secondary | ICD-10-CM

## 2015-06-29 DIAGNOSIS — R279 Unspecified lack of coordination: Secondary | ICD-10-CM

## 2015-06-29 DIAGNOSIS — R269 Unspecified abnormalities of gait and mobility: Secondary | ICD-10-CM

## 2015-06-29 DIAGNOSIS — N8184 Pelvic muscle wasting: Secondary | ICD-10-CM | POA: Diagnosis not present

## 2015-06-29 NOTE — Patient Instructions (Addendum)
  Clam Shell 45 Degrees on for R leg    Lying with hips and knees bent 45, one pillow between knees and ankles. Lift knee with exhale. Be sure pelvis does not roll backward. Do not arch back. Do 15  times,2 set/  2x  per day.  [email or web address].[email or web address]   Copyright  VHI. All rights reserved.    ________________  Cristina Wade over stretch :  L thigh over R leg, 5 breaths

## 2015-06-30 NOTE — Therapy (Signed)
[facility] MAIN [facility] SERVICES [address] [place], [place], [address] Phone: [phone]   Fax:  [phone]  Physical Therapy Treatment  Patient Details  Name: Cristina Wade MRN: [id number] Date of Birth: 07/18/1970 Referring Provider: Marlou Porch   Encounter Date: 06/29/2015      PT End of Session - 06/30/15 2339    Visit Number 3   Number of Visits 12   PT Start Time 1500   PT Stop Time 1555   PT Time Calculation (min) 55 min   Activity Tolerance Patient tolerated treatment well;No increased pain   Behavior During Therapy [facility] for tasks assessed/performed      Past Medical History  Diagnosis Date  . Frequent headaches   . Migraines   . UTI (lower urinary tract infection)   . Kidney stones   . Cystitis   . Osteopenia   . Endometriosis   . Microscopic hematuria   . Gross hematuria   . Cystocele   . Migraine   . Urinary disorder     Past Surgical History  Procedure Laterality Date  . Gum surgery    . Laproscopy  2007  . Cystoscopy  2007    with biopsy   . Colonoscopy N/A 11/05/2014    Procedure: COLONOSCOPY;  Surgeon: Scot Jun, MD;  Location: [facility] ENDOSCOPY;  Service: Endoscopy;  Laterality: N/A;  . Dilatation & curettage/hysteroscopy with myosure N/A 04/13/2015    Procedure: DILATATION & CURETTAGE/HYSTEROSCOPY WITH MYOSURE/POLYPECTOMY;  Surgeon: Elenora Fender Ward, MD;  Location: [facility] ORS;  Service: Gynecology;  Laterality: N/A;    There were no vitals filed for this visit.  Visit Diagnosis:  Pelvic floor dysfunction  Lack of coordination  Gait abnormality      Subjective Assessment - 06/30/15 2344    Subjective Pt report she is not straining as much anymore with bowel movements.     Patient is accompained by: Family member  twin sister   Pertinent History Pt stands 6 hr at work.             [facility] PT Assessment - 06/30/15 2338    Ambulation/Gait   Gait Comments decreased lateral sway                    Pelvic Floor Special Questions - 06/30/15 2338    Pelvic Floor Internal Exam --  pt consented verbally without contraindications   Exam Type Vaginal   Palpation increased mm tensions along L 1-3 layers  decreased post Tx           [facility] Adult PT Treatment/Exercise - 06/30/15 2338    Neuro Re-ed    Neuro Re-ed Details  see pt instructions    Exercises   Exercises --  see pt instructions   Manual Therapy   Internal Pelvic Floor thiele massage                 PT Education - 06/29/15 1618    Education provided Yes   Education Details HEP   Person(s) Educated Patient   Methods Explanation;Demonstration;Tactile cues;Verbal cues;Handout   Comprehension Returned demonstration;Verbalized understanding             PT Long Term Goals - 06/29/15 1504    PT LONG TERM GOAL #1   Title (p) Pt will report increased bowel movements from 2x/ week to 3-5x/ week in order to restore bowel function.    Time (p) 12   Period (p) Weeks   Status (  p) Achieved   PT LONG TERM GOAL #2   Title (p) Pt will report Bristol Type Stool Scale type 4 for 50% which will be an improvement from Type 1-2 in order to decrease constipation.    Time (p) 12   Period (p) Weeks   Status (p) Achieved   PT LONG TERM GOAL #3   Title (p) Pt will report being able to initate urination with < 3 sec delay in order to perform ADLs.    Time (p) 12   Period (p) Weeks   Status (p) On-going   PT LONG TERM GOAL #4   Title (p) Pt will decrease her score on NIH-CPSI from 51% to < 31% in order to demo improved pelvic floor function and QOL.   Time (p) 12   Period (p) Weeks   Status (p) New               Plan - 06/30/15 2339    Clinical Impression Statement Pt is progressing well with restored bowel function but continued to c/o difficulty with urination. Pt showed overactive pelvic floor mm which decreased in mm tensions/ tenderness with manual Tx.   Suspect leg length  difference/ L hip weakness may be associated with L sided pelvic floor mm tensions. Pt uses her heel lift provided to her at last session. Plan to continue progress hip strengthening to address this deficit. Pt will benefit from skilled PT.    Pt will benefit from skilled therapeutic intervention in order to improve on the following deficits Abnormal gait;Decreased balance;Decreased safety awareness;Difficulty walking;Impaired flexibility;Decreased activity tolerance;Decreased endurance;Decreased range of motion;Decreased strength;Hypomobility;Decreased coordination;Decreased mobility;Increased muscle spasms;Postural dysfunction;Improper body mechanics;Decreased cognition   Rehab Potential Good   PT Frequency 1x / week   PT Duration 12 weeks   PT Treatment/Interventions ADLs/Self Care Home Management;Aquatic Therapy;Electrical Stimulation;Cryotherapy;Gait training;Traction;Moist Heat;Stair training;Functional mobility training;Therapeutic activities;Therapeutic exercise;Balance training;Neuromuscular re-education;Manual techniques;Patient/family education;Scar mobilization;Passive range of motion;Energy conservation;Taping   Consulted and Agree with Plan of Care Patient;Family member/caregiver        Problem List Patient Active Problem List   Diagnosis Date Noted  . Acute URI 05/17/2015  . Hematuria, microscopic 11/19/2014  . Microscopic hematuria 10/22/2014  . Straining during urination 10/22/2014  . Encounter to establish care 09/28/2014  . Vestibular migraine 09/28/2014  . Excessive cerumen in both ear canals 09/28/2014  . Encounter for screening colonoscopy 11/21/2013    Mariane MastersYeung,Shin Yiing ,PT, DPT, E-RYT  06/30/2015, 11:45 PM  [facility] [address] [place], [place], [address] Phone: [phone]   Fax:  [phone]  Name: Jeannette CorpusSharon Dimmick MRN: [id number] Date of Birth: July 09, 1970

## 2015-07-06 ENCOUNTER — Ambulatory Visit: Payer: BC Managed Care – PPO | Admitting: Physical Therapy

## 2015-07-06 DIAGNOSIS — R279 Unspecified lack of coordination: Secondary | ICD-10-CM

## 2015-07-06 DIAGNOSIS — N8184 Pelvic muscle wasting: Secondary | ICD-10-CM | POA: Diagnosis not present

## 2015-07-06 DIAGNOSIS — R269 Unspecified abnormalities of gait and mobility: Secondary | ICD-10-CM

## 2015-07-06 DIAGNOSIS — M6289 Other specified disorders of muscle: Secondary | ICD-10-CM

## 2015-07-06 NOTE — Patient Instructions (Signed)
You are now ready to begin training the deep core muscles system: diaphragm, transverse abdominis, pelvic floor . These muscles must work together as a team.           The key to these exercises to train the brain to coordinate the timing of these muscles and to have them turn on for long periods of time to hold you upright against gravity (especially important if you are on your feet all day).These muscles are postural muscles and play a role stabilizing your spine and bodyweight. By doing these repetitions slowly and correctly instead of doing crunches, you will achieve a flatter belly without a lower pooch. You are also placing your spine in a more neutral position and breathing properly which in turn, decreases your risk for problems related to your pelvic floor, abdominal, and low back such as pelvic organ prolapse, hernias, diastasis recti (separation of superficial muscles), disk herniations, spinal fractures. These exercises set a solid foundation for you to later progress to resistance/ strength training with therabands and weights and return to other typical fitness exercises with a stronger deeper core.   Do Level 2 (knee out ) 10 x 3 sets / 1-2 x day

## 2015-07-06 NOTE — Therapy (Signed)
[place] MAIN [facility] SERVICES [address] [place], [place], [address] Phone: [phone]   Fax:  [phone]  Physical Therapy Treatment  Patient Details  Name: Cristina Wade MRN: [id number] Date of Birth: [birth year] Referring Provider: Louis Meckel   Encounter Date: 07/06/2015      PT End of Session - 07/06/15 1551    Visit Number 4   Number of Visits 12   Date for PT Re-Evaluation 08/18/15   PT Start Time 1510   PT Stop Time 1545   PT Time Calculation (min) 35 min   Activity Tolerance Patient tolerated treatment well;No increased pain   Behavior During Therapy [facility] for tasks assessed/performed      Past Medical History  Diagnosis Date  . Frequent headaches   . Migraines   . UTI (lower urinary tract infection)   . Kidney stones   . Cystitis   . Osteopenia   . Endometriosis   . Microscopic hematuria   . Gross hematuria   . Cystocele   . Migraine   . Urinary disorder     Past Surgical History  Procedure Laterality Date  . Gum surgery    . Laproscopy  2007  . Cystoscopy  2007    with biopsy   . Colonoscopy N/A 11/05/2014    Procedure: COLONOSCOPY;  Surgeon: Manya Silvas, MD;  Location: [facility] ENDOSCOPY;  Service: Endoscopy;  Laterality: N/A;  . Dilatation & curettage/hysteroscopy with myosure N/A 04/13/2015    Procedure: DILATATION & CURETTAGE/HYSTEROSCOPY WITH MYOSURE/POLYPECTOMY;  Surgeon: Honor Loh Ward, MD;  Location: [facility] ORS;  Service: Gynecology;  Laterality: N/A;    There were no vitals filed for this visit.  Visit Diagnosis:  Pelvic floor dysfunction  Lack of coordination  Gait abnormality      Subjective Assessment - 07/06/15 1510    Subjective Pt reported she only has to wait 30sec to 1 min before urinating when before, she had wait 2 minutes. Pt also notices her heel lift has helped her walk straighter.    Patient is accompained by: Family member  twin sister   Pertinent History Pt stands 6 hr at work.                        Pelvic Floor Special Questions - 07/06/15 1550    Pelvic Floor Internal Exam --  pt consented verbally without contraindications   Exam Type Vaginal   Palpation no increased mm tensions   able to demo normal ROM            [facility] Adult PT Treatment/Exercise - 07/06/15 1555    Neuro Re-ed    Neuro Re-ed Details  deep core level 2 , pelvic floor ROM                PT Education - 07/06/15 1550    Education provided Yes   Education Details HEP, reassessed goals, discussed one more visit before d/c    Person(s) Educated Patient   Methods Explanation;Demonstration;Tactile cues;Verbal cues;Handout   Comprehension Returned demonstration;Verbalized understanding             PT Long Term Goals - 07/06/15 1524    PT LONG TERM GOAL #1   Title Pt will report increased bowel movements from 2x/ week to 3-5x/ week in order to restore bowel function.    Time 12   Period Weeks   Status Achieved   PT LONG TERM GOAL #2   Title Pt  will report Bristol Type Stool Scale type 4 for 50% which will be an improvement from Type 1-2 in order to decrease constipation.    Time 12   Period Weeks   Status Achieved   PT LONG TERM GOAL #3   Title Pt will report being able to initate urination with < 3 sec delay in order to perform ADLs.    Time 12   Period Weeks   Status Partially Met   PT LONG TERM GOAL #4   Title Pt will decrease her score on NIH-CPSI from 51% to < 31% in order to demo improved pelvic floor function and QOL. (3/21: 9%)    Time 12   Period Weeks   Status Achieved               Plan - 07/06/15 1551    Clinical Impression Statement Pt has partially met her last goal and is progressing with deep core mm strengthening to address the repeated lifting of trays she engages in at work. Pt showed no pelvic floor mm tensions which has contributed to her report of improved ability to initiate urination.  Pt remains compliant with shoe left  wearing to accommodate for her shorter L LE > R and this is  likely going to help minimize risk for pelvic floor overactivity on L.  Pt will be ready for d/c at next visit.      Pt will benefit from skilled therapeutic intervention in order to improve on the following deficits Abnormal gait;Decreased balance;Decreased safety awareness;Difficulty walking;Impaired flexibility;Decreased activity tolerance;Decreased endurance;Decreased range of motion;Decreased strength;Hypomobility;Decreased coordination;Decreased mobility;Increased muscle spasms;Postural dysfunction;Improper body mechanics;Decreased cognition   Rehab Potential Good   PT Frequency 1x / week   PT Duration 12 weeks   PT Treatment/Interventions ADLs/Self Care Home Management;Aquatic Therapy;Electrical Stimulation;Cryotherapy;Gait training;Traction;Moist Heat;Stair training;Functional mobility training;Therapeutic activities;Therapeutic exercise;Balance training;Neuromuscular re-education;Manual techniques;Patient/family education;Scar mobilization;Passive range of motion;Energy conservation;Taping   Consulted and Agree with Plan of Care Patient;Family member/caregiver        Problem List Patient Active Problem List   Diagnosis Date Noted  . Acute URI 05/17/2015  . Hematuria, microscopic 11/19/2014  . Microscopic hematuria 10/22/2014  . Straining during urination 10/22/2014  . Encounter to establish care 09/28/2014  . Vestibular migraine 09/28/2014  . Excessive cerumen in both ear canals 09/28/2014  . Encounter for screening colonoscopy 11/21/2013    Jerl Mina ,PT, DPT, E-RYT  07/06/2015, 3:56 PM  [place] MAIN [facility] SERVICES [address] [place], [place], [address] Phone: [phone]   Fax:  [phone]  Name: Cristina Wade MRN: [id number] Date of Birth: 03-12-1971

## 2015-07-13 ENCOUNTER — Ambulatory Visit: Payer: BC Managed Care – PPO | Admitting: Physical Therapy

## 2015-07-13 DIAGNOSIS — N8184 Pelvic muscle wasting: Secondary | ICD-10-CM | POA: Diagnosis not present

## 2015-07-13 DIAGNOSIS — R269 Unspecified abnormalities of gait and mobility: Secondary | ICD-10-CM

## 2015-07-13 DIAGNOSIS — M6289 Other specified disorders of muscle: Secondary | ICD-10-CM

## 2015-07-13 DIAGNOSIS — R279 Unspecified lack of coordination: Secondary | ICD-10-CM

## 2015-07-13 NOTE — Therapy (Signed)
[facility] SERVICES [address] [place], [place], [address] Phone: [phone]   Fax:  [phone]  Physical Therapy Treatment  Patient Details  Name: Cristina Wade MRN: [id number] Date of Birth: [birth year] Referring Provider: Louis Meckel   Encounter Date: 07/13/2015      PT End of Session - 07/13/15 2138    Visit Number 5   Number of Visits 12   Date for PT Re-Evaluation 08/18/15   PT Start Time 1435   PT Stop Time 1540   PT Time Calculation (min) 65 min   Activity Tolerance Patient tolerated treatment well;No increased pain   Behavior During Therapy [facility] for tasks assessed/performed      Past Medical History  Diagnosis Date  . Frequent headaches   . Migraines   . UTI (lower urinary tract infection)   . Kidney stones   . Cystitis   . Osteopenia   . Endometriosis   . Microscopic hematuria   . Gross hematuria   . Cystocele   . Migraine   . Urinary disorder     Past Surgical History  Procedure Laterality Date  . Gum surgery    . Laproscopy  2007  . Cystoscopy  2007    with biopsy   . Colonoscopy N/A 11/05/2014    Procedure: COLONOSCOPY;  Surgeon: Manya Silvas, MD;  Location: [facility] ENDOSCOPY;  Service: Endoscopy;  Laterality: N/A;  . Dilatation & curettage/hysteroscopy with myosure N/A 04/13/2015    Procedure: DILATATION & CURETTAGE/HYSTEROSCOPY WITH MYOSURE/POLYPECTOMY;  Surgeon: Honor Loh Ward, MD;  Location: [facility] ORS;  Service: Gynecology;  Laterality: N/A;    There were no vitals filed for this visit.  Visit Diagnosis:  Pelvic floor dysfunction  Lack of coordination  Gait abnormality      Subjective Assessment - 07/13/15 1445    Subjective Pt reported she has been able urinate within 30 sec.    Patient is accompained by: Family member  twin sister   Pertinent History Pt stands 6 hr at work.             [facility] PT Assessment - 07/13/15 1451    Coordination   Coordination and Movement Description  good carry over w/ deep core level 2 but lumbopelvic instability w/ level 3   Posture/Postural Control   Posture Comments poor propioception of thorax over pelvis despite excessive cuing  increased lumbar lordosis   Strength   Overall Strength Comments hip abd L 3/5 --> 5/5                      [facility] Adult PT Treatment/Exercise - 07/13/15 1451    Therapeutic Activites    Lifting proper body mechanics, low lung  3 reps . LOB   Work Economist use of semi tandem stance and weight shift with pulling tray 5 reps    Neuro Re-ed    Neuro Re-ed Details  see pt instructions , posture training, excessive tactile and visual cues                PT Education - 07/13/15 2136    Education provided Yes   Education Details HEP, discussed the benefit for continued skilled PT to increased postural strength to minimize risk for relapse of Sx   Person(s) Educated Patient   Methods Explanation;Demonstration;Tactile cues;Verbal cues;Handout   Comprehension Returned demonstration;Verbalized understanding             PT Long Term Goals - 07/13/15 1450  PT LONG TERM GOAL #1   Title Pt will report increased bowel movements from 2x/ week to 3-5x/ week in order to restore bowel function.    Time 12   Period Weeks   Status Achieved   PT LONG TERM GOAL #2   Title Pt will report Bristol Type Stool Scale type 4 for 50% which will be an improvement from Type 1-2 in order to decrease constipation.    Time 12   Period Weeks   Status Achieved   PT LONG TERM GOAL #3   Title Pt will report being able to initate urination with < 3 sec delay in order to perform ADLs. (07/13/15 : 30 sec)    Time 12   Period Weeks   Status Partially Met   PT LONG TERM GOAL #4   Title Pt will decrease her score on NIH-CPSI from 51% to < 31% in order to demo improved pelvic floor function and QOL. (3/21: 9%)    Time 12   Period Weeks   Status Achieved   PT LONG TERM GOAL #5   Title Pt will demo  neutral spine and pelvis without hyperextended knees nor verbal cuing.   Time 12   Period Days   Status New               Plan - 07/13/15 2138    Clinical Impression Statement While pt's pelvic floor function is improving, pt's outer/deep core muscles continue to show weakness. Pt showed poor propioception of thorax over pelvis and increased lumbar lordosis. These deficits warranted more sessions to build up deep core mm strength in order to minimize relapse of Sx, address loading demand on her spine w/ repeated pulling/lifting at work, and address leg length difference. 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 Pt's frequency visits will decrease to every other week in order to provide pt sufficient time to strengthen with HEP.   Pt will benefit from skilled therapeutic intervention in order to improve on the following deficits Abnormal gait;Decreased balance;Decreased safety awareness;Difficulty walking;Impaired flexibility;Decreased activity tolerance;Decreased endurance;Decreased range of motion;Decreased strength;Hypomobility;Decreased coordination;Decreased mobility;Increased muscle spasms;Postural dysfunction;Improper body mechanics;Decreased cognition   Rehab Potential Good   PT Frequency 1x / week   PT Duration 12 weeks   PT Treatment/Interventions ADLs/Self Care Home Management;Aquatic Therapy;Electrical Stimulation;Cryotherapy;Gait training;Traction;Moist Heat;Stair training;Functional mobility training;Therapeutic activities;Therapeutic exercise;Balance training;Neuromuscular re-education;Manual techniques;Patient/family education;Scar mobilization;Passive range of motion;Energy conservation;Taping   Consulted and Agree with Plan of Care Patient;Family member/caregiver        Problem List Patient Active Problem List   Diagnosis Date Noted  . Acute URI 05/17/2015  . Hematuria, microscopic 11/19/2014  . Microscopic hematuria 10/22/2014  . Straining  during urination 10/22/2014  . Encounter to establish care 09/28/2014  . Vestibular migraine 09/28/2014  . Excessive cerumen in both ear canals 09/28/2014  . Encounter for screening colonoscopy 11/21/2013    Jerl Mina ,PT, DPT, E-RYT  07/13/2015, 9:44 PM  [place] MAIN [facility] SERVICES [address] [place], [place], [address] Phone: [phone]   Fax:  [phone]  Name: Jetty Berland MRN: [id number] Date of Birth: 19-Apr-1970

## 2015-07-13 NOTE — Patient Instructions (Signed)
Multifidis twist   10 x 2 each side   Drawing the laundry off the line across face 10 x 2   ___________________  Progress with deep core level 2 (30 reps, 2 x day)  For 2 weeks  Then Deep Core level 3 after you can stabilize back without rocking (30 reps ,x 2 x day)   Then level 4 after practicing 3 weeks of  Deep core 3  ___________________  Ski tracks, one leg front and other back for trays at work  Weight shift between legs to decrease midback muscle overuse   Picking light object off the floor, spreading weight across both legs

## 2015-07-20 ENCOUNTER — Ambulatory Visit: Payer: BC Managed Care – PPO | Admitting: Physical Therapy

## 2015-07-27 ENCOUNTER — Ambulatory Visit: Payer: BC Managed Care – PPO | Attending: Urology | Admitting: Physical Therapy

## 2015-07-27 DIAGNOSIS — N8184 Pelvic muscle wasting: Secondary | ICD-10-CM | POA: Diagnosis present

## 2015-07-27 DIAGNOSIS — R269 Unspecified abnormalities of gait and mobility: Secondary | ICD-10-CM | POA: Diagnosis present

## 2015-07-27 DIAGNOSIS — R279 Unspecified lack of coordination: Secondary | ICD-10-CM | POA: Diagnosis not present

## 2015-07-27 DIAGNOSIS — M6289 Other specified disorders of muscle: Secondary | ICD-10-CM

## 2015-07-27 NOTE — Patient Instructions (Addendum)
R hip stretches:    Butterfly wings (knees apart )  10 x3  Figure -4 (5 breaths) with R ankle over L thigh w/ L knee bent    Cross-over stretch (5 breaths)  With R ankle over L thigh, reach with L hand to draw R thigh towards L

## 2015-07-28 NOTE — Therapy (Addendum)
[place] MAIN [facility] SERVICES [address] [place], [place], [address] Phone: [phone]   Fax:  [phone]  Physical Therapy Treatment /Discharge Summary   Patient Details  Name: Cristina Wade MRN: [id number] Date of Birth: 04/08/1971 Referring Provider: Louis Meckel   Encounter Date: 07/27/2015      PT End of Session - 07/28/15 2201    Visit Number 6   Number of Visits 12   Date for PT Re-Evaluation 08/18/15   PT Start Time 1505   PT Stop Time 1545   PT Time Calculation (min) 40 min   Activity Tolerance Patient tolerated treatment well;No increased pain   Behavior During Therapy [facility] for tasks assessed/performed      Past Medical History  Diagnosis Date  . Frequent headaches   . Migraines   . UTI (lower urinary tract infection)   . Kidney stones   . Cystitis   . Osteopenia   . Endometriosis   . Microscopic hematuria   . Gross hematuria   . Cystocele   . Migraine   . Urinary disorder     Past Surgical History  Procedure Laterality Date  . Gum surgery    . Laproscopy  2007  . Cystoscopy  2007    with biopsy   . Colonoscopy N/A 11/05/2014    Procedure: COLONOSCOPY;  Surgeon: Manya Silvas, MD;  Location: [facility] ENDOSCOPY;  Service: Endoscopy;  Laterality: N/A;  . Dilatation & curettage/hysteroscopy with myosure N/A 04/13/2015    Procedure: DILATATION & CURETTAGE/HYSTEROSCOPY WITH MYOSURE/POLYPECTOMY;  Surgeon: Honor Loh Ward, MD;  Location: [facility] ORS;  Service: Gynecology;  Laterality: N/A;    There were no vitals filed for this visit.      Subjective Assessment - 07/27/15 1513    Subjective Pt reported she has been performing her exercises.    Patient is accompained by: Family member  twin sister   Pertinent History Pt stands 6 hr at work.             [facility] PT Assessment - 07/28/15 2159    Strength   Overall Strength Comments hip abd B 5/5   Flexibility   Soft Tissue Assessment /Muscle Length --  R hip limited  in hip IR > L. Increased post -Tx                     [facility] Adult PT Treatment/Exercise - 07/28/15 2200    Neuro Re-ed    Neuro Re-ed Details  corrected HEP   Exercises   Exercises --  see pt instructions                PT Education - 07/27/15 1527    Education provided Yes   Education Details HEP, D/C    Person(s) Educated Patient   Methods Explanation;Demonstration;Tactile cues;Verbal cues;Handout   Comprehension Returned demonstration;Verbalized understanding             PT Long Term Goals - 07/27/15 1527    PT LONG TERM GOAL #1   Title Pt will report increased bowel movements from 2x/ week to 3-5x/ week in order to restore bowel function.    Time 12   Period Weeks   Status Achieved   PT LONG TERM GOAL #2   Title Pt will report Bristol Type Stool Scale type 4 for 50% which will be an improvement from Type 1-2 in order to decrease constipation.    Time 12   Period Weeks   Status  Achieved   PT LONG TERM GOAL #3   Title Pt will report being able to initate urination with < 3 sec delay in order to perform ADLs. (07/13/15 : 30 sec, 4/11: 25 sec)    Time 12   Period Weeks   Status Partially Met   PT LONG TERM GOAL #4   Title Pt will decrease her score on NIH-CPSI from 51% to < 31% in order to demo improved pelvic floor function and QOL. (3/21: 9%)    Time 12   Period Weeks   Status Achieved   PT LONG TERM GOAL #5   Title Pt will demo neutral spine and pelvis without hyperextended knees nor verbal cuing.   Time 12   Period Days   Status Achieved               Plan - 07/27/15 1533    Clinical Impression Statement Across the past 6 visits, pt has achieved 4/5   of her goals and continues to make progress towards her remaining goal. Pt reports her Sx have improved a "Great Deal better" based on GROC.  Pt has been able to initiate urination quicker than SOC, from 4 minutes to 25 sec.  Pt states her bowel constistency based on Bristol Stool  Scale has improved  from Type 1 (pellets) to  Type 4 (logs). Pt shows less gait deviations after being provided a shoe lift to accommodate for her leg length difference. Pt is ready for d/c. Pt has been great to work with as she remained complaint and eager to learn.       Rehab Potential Good   PT Frequency 1x / week   PT Duration 12 weeks   PT Treatment/Interventions ADLs/Self Care Home Management;Aquatic Therapy;Electrical Stimulation;Cryotherapy;Gait training;Traction;Moist Heat;Stair training;Functional mobility training;Therapeutic activities;Therapeutic exercise;Balance training;Neuromuscular re-education;Manual techniques;Patient/family education;Scar mobilization;Passive range of motion;Energy conservation;Taping   Consulted and Agree with Plan of Care Patient;Family member/caregiver      Patient will benefit from skilled therapeutic intervention in order to improve the following deficits and impairments:  Abnormal gait, Decreased balance, Decreased safety awareness, Difficulty walking, Impaired flexibility, Decreased activity tolerance, Decreased endurance, Decreased range of motion, Decreased strength, Hypomobility, Decreased coordination, Decreased mobility, Increased muscle spasms, Postural dysfunction, Improper body mechanics, Decreased cognition  Visit Diagnosis:  Pelvic floor dysfunction  Lack of coordination  Gait abnormality     Problem List Patient Active Problem List   Diagnosis Date Noted  . Acute URI 05/17/2015  . Hematuria, microscopic 11/19/2014  . Microscopic hematuria 10/22/2014  . Straining during urination 10/22/2014  . Encounter to establish care 09/28/2014  . Vestibular migraine 09/28/2014  . Excessive cerumen in both ear canals 09/28/2014  . Encounter for screening colonoscopy 11/21/2013    Jerl Mina ,PT, DPT, E-RYT  07/28/2015, 10:04 PM  [place] MAIN [facility] SERVICES [address] [place], [place],  [address] Phone: [phone]   Fax:  [phone]  Name: Sybel Standish MRN: [id number] Date of Birth: 1971-03-13

## 2015-08-03 ENCOUNTER — Ambulatory Visit: Payer: BC Managed Care – PPO | Admitting: Physical Therapy

## 2015-08-10 ENCOUNTER — Ambulatory Visit: Payer: BC Managed Care – PPO | Admitting: Physical Therapy

## 2015-09-30 ENCOUNTER — Encounter: Payer: Self-pay | Admitting: Primary Care

## 2015-09-30 ENCOUNTER — Encounter: Payer: Self-pay | Admitting: *Deleted

## 2015-09-30 ENCOUNTER — Ambulatory Visit (INDEPENDENT_AMBULATORY_CARE_PROVIDER_SITE_OTHER): Payer: BC Managed Care – PPO | Admitting: Primary Care

## 2015-09-30 VITALS — BP 120/70 | HR 76 | Temp 97.8°F | Ht 70.0 in | Wt 148.0 lb

## 2015-09-30 DIAGNOSIS — Z0000 Encounter for general adult medical examination without abnormal findings: Secondary | ICD-10-CM

## 2015-09-30 DIAGNOSIS — Z23 Encounter for immunization: Secondary | ICD-10-CM | POA: Diagnosis not present

## 2015-09-30 DIAGNOSIS — R3129 Other microscopic hematuria: Secondary | ICD-10-CM | POA: Diagnosis not present

## 2015-09-30 LAB — CBC
HEMATOCRIT: 41.3 % (ref 36.0–46.0)
Hemoglobin: 13.9 g/dL (ref 12.0–15.0)
MCHC: 33.8 g/dL (ref 30.0–36.0)
MCV: 90.7 fl (ref 78.0–100.0)
PLATELETS: 249 10*3/uL (ref 150.0–400.0)
RBC: 4.55 Mil/uL (ref 3.87–5.11)
RDW: 12.9 % (ref 11.5–15.5)
WBC: 7.5 10*3/uL (ref 4.0–10.5)

## 2015-09-30 LAB — COMPREHENSIVE METABOLIC PANEL
ALBUMIN: 4.4 g/dL (ref 3.5–5.2)
ALT: 11 U/L (ref 0–35)
AST: 12 U/L (ref 0–37)
Alkaline Phosphatase: 29 U/L — ABNORMAL LOW (ref 39–117)
BUN: 12 mg/dL (ref 6–23)
CALCIUM: 9.4 mg/dL (ref 8.4–10.5)
CHLORIDE: 104 meq/L (ref 96–112)
CO2: 28 meq/L (ref 19–32)
Creatinine, Ser: 0.78 mg/dL (ref 0.40–1.20)
GFR: 84.11 mL/min (ref >60.00)
Glucose, Bld: 85 mg/dL (ref 70–99)
POTASSIUM: 4.2 meq/L (ref 3.5–5.1)
Sodium: 138 mEq/L (ref 135–145)
Total Bilirubin: 0.8 mg/dL (ref 0.2–1.2)
Total Protein: 7.5 g/dL (ref 6.0–8.3)

## 2015-09-30 LAB — LIPID PANEL
CHOLESTEROL: 185 mg/dL (ref 0–200)
HDL: 46.6 mg/dL (ref >39.00)
LDL CALC: 118 mg/dL — AB (ref 0–99)
NonHDL: 138.03
TRIGLYCERIDES: 99 mg/dL (ref 0.0–149.0)
Total CHOL/HDL Ratio: 4
VLDL: 19.8 mg/dL (ref 0.0–40.0)

## 2015-09-30 LAB — HEMOGLOBIN A1C: HEMOGLOBIN A1C: 5.5 % (ref 4.6–6.5)

## 2015-09-30 NOTE — Assessment & Plan Note (Signed)
Evaluated by urology in the past with unknown etiology to hematuria. Follows with GYN annually. History of endometriosis

## 2015-09-30 NOTE — Patient Instructions (Addendum)
You have been provided with a tetanus vaccination which will cover you for 10 years.  Complete lab work prior to leaving today. I will notify you of your results once received.   Work to increase vegetables, fruit, lean protein, whole grains. Reduce junk food, fatty foods.  Start exercising. You should be getting 1 hour of moderate intensity exercise 5 days weekly.  Ensure you are consuming 64 ounces of water daily.  Apply the triamcinolone cream twice daily.  Follow up in 1 year for repeat physical or sooner if needed.   It was a pleasure to meet you today! Please don't hesitate to call me with any questions. [facility]!

## 2015-09-30 NOTE — Progress Notes (Signed)
Pre visit review using our clinic review tool, if applicable. No additional management support is needed unless otherwise documented below in the visit note. 

## 2015-09-30 NOTE — Assessment & Plan Note (Signed)
Tetanus due, administered today. Pap, mammogram, colonoscopy up-to-date. Poor diet and does not routinely exercise. Discussed the importance of a healthy diet and regular exercise in order for weight loss and to reduce risk of other medical diseases. Labs pending, exam unremarkable except for very mild poison ivy dermatitis that is healing well.  Follow-up in one year for repeat physical.

## 2015-09-30 NOTE — Progress Notes (Signed)
Subjective:    Patient ID: Cristina Wade, female    DOB: [birth year], 45 y.o.   MRN: [id number]  HPI  Cristina Wade is a 45 year old female who [facility] and for complete physical.  1) Hematuria: Present for the past 2-3 years. She's had evaluation by a Urologist without any evidence of the cause. The last time this was discovered was about 1 year ago. She can never visualize the bloods.    Immunizations: -Tetanus: Completed in 2007. Due today. -Influenza: Did not receive last season.   Diet: Endorses a fair diet. Breakfast: Oatmeal, granola bar, pancakes, toaster stroodle, biscuits Lunch: School lunch (fruit, sandwich) Dinner: Medical sales representativeizza, hot dogs, chicken pie, vegetables Snacks: Chocolate, ice cream, cookies Desserts: See above Beverages: Water  Exercise: She does not currently exercise. Eye exam: Completed 2 years ago. Dental exam: Completed 4 months ago. Colonoscopy: Completed in July 2016, due back in 5 years. Pap Smear: Completed in August 2016 Mammogram: Completed in August 2016   Review of Systems  Constitutional: Negative for unexpected weight change.  HENT: Negative for rhinorrhea.   Respiratory: Negative for cough and shortness of breath.   Cardiovascular: Negative for chest pain.  Gastrointestinal: Positive for constipation. Negative for diarrhea.       Takes Miralax occasionally   Genitourinary: Negative for difficulty urinating and menstrual problem.       Endometriosis   Musculoskeletal: Negative for myalgias and arthralgias.  Skin: Positive for rash.       Has been working out in her yard.  Allergic/Immunologic: Positive for environmental allergies.  Neurological: Negative for dizziness, numbness and headaches.       Occasional dizziness, history of inner ear disorder  Psychiatric/Behavioral:       Denies concerns for anxiety or depression       Past Medical History  Diagnosis Date  . Frequent headaches   .  Migraines   . UTI (lower urinary tract infection)   . Kidney stones   . Cystitis   . Osteopenia   . Endometriosis   . Microscopic hematuria   . Gross hematuria   . Cystocele   . Migraine   . Urinary disorder      Social History   Social History  . Marital Status: Single    Spouse Name: N/A  . Number of Children: N/A  . Years of Education: N/A   Occupational History  . Not on file.   Social History Main Topics  . Smoking status: Never Smoker   . Smokeless tobacco: Never Used  . Alcohol Use: No  . Drug Use: No  . Sexual Activity: No   Other Topics Concern  . Not on file   Social History Narrative   Lives with twin sister   Work- [employer]    No pets    No children    Right handed    No caffeine daily- tea occasionally; eats chocolate    Enjoys shopping, resting, [place].     Past Surgical History  Procedure Laterality Date  . Gum surgery    . Laproscopy  2007  . Cystoscopy  2007    with biopsy   . Colonoscopy N/A 11/05/2014    Procedure: COLONOSCOPY;  Surgeon: Scot Junobert T Elliott, MD;  [facility];  Service: Endoscopy;  Laterality: N/A;  . Dilatation & curettage/hysteroscopy with myosure N/A 04/13/2015    Procedure: DILATATION & CURETTAGE/HYSTEROSCOPY WITH MYOSURE/POLYPECTOMY;  Surgeon:  Elenora Fender Ward, MD;  Location: [facility] ORS;  Service: Gynecology;  Laterality: N/A;    Family History  Problem Relation Age of Onset  . Colon cancer Father 87  . Arthritis Father   . Hyperlipidemia Father   . Cancer Father     colon/prostate  . Arthritis Mother   . Heart disease Mother   . Stroke Mother     TIA  . Hypertension Mother   . Kidney disease Mother   . Diabetes Mother   . Cancer Maternal Grandmother   . Cancer Paternal Grandfather     prostate  . Transient ischemic attack Father     Allergies  Allergen Reactions  . Lac Bovis Other (See Comments)  . Milk-Related Compounds   . Penicillins      Current Outpatient Prescriptions on File Prior to Visit  Medication Sig Dispense Refill  . Calcium Carbonate-Vit D-Min (CALCIUM 1200 PO) Take by mouth. Reported on 05/26/2015    . levonorgestrel-ethinyl estradiol (SEASONALE,INTROVALE,JOLESSA) 0.15-0.03 MG tablet Take 1 tablet by mouth daily.    . Multiple Vitamin (MULTI-VITAMINS) TABS Take 1 tablet by mouth.     No current facility-administered medications on file prior to visit.    BP 120/70 mmHg  Pulse 76  Temp(Src) 97.8 F (36.6 C) (Oral)  Ht  (1.778 m)  Wt 148 lb (67.132 kg)  BMI 21.24 kg/m2  SpO2 99%    Objective:   Physical Exam  Constitutional: She is oriented to person, place, and time. She appears well-nourished.  HENT:  Right Ear: Tympanic membrane and ear canal normal.  Left Ear: Tympanic membrane and ear canal normal.  Nose: Nose normal.  Mouth/Throat: Oropharynx is clear and moist.  Eyes: Conjunctivae and EOM are normal. Pupils are equal, round, and reactive to light.  Neck: Neck supple. No thyromegaly present.  Cardiovascular: Normal rate and regular rhythm.   No murmur heard. Pulmonary/Chest: Effort normal and breath sounds normal. She has no rales.  Abdominal: Soft. Bowel sounds are normal. There is no tenderness.  Musculoskeletal: Normal range of motion.  Lymphadenopathy:    She has no cervical adenopathy.  Neurological: She is alert and oriented to person, place, and time. She has normal reflexes. No cranial nerve deficit.  Skin: Skin is warm and dry. Rash noted.  Mild well-healing rash between third and fourth digit to right hand representative of poison ivy dermatitis. Does not appear to be scabies.  Psychiatric: She has a normal mood and affect.          Assessment & Plan:

## 2015-10-18 ENCOUNTER — Other Ambulatory Visit: Payer: Self-pay | Admitting: Obstetrics & Gynecology

## 2015-10-18 DIAGNOSIS — Z1231 Encounter for screening mammogram for malignant neoplasm of breast: Secondary | ICD-10-CM

## 2015-11-01 ENCOUNTER — Ambulatory Visit: Payer: BC Managed Care – PPO | Admitting: Primary Care

## 2015-11-02 ENCOUNTER — Encounter: Payer: Self-pay | Admitting: Primary Care

## 2015-11-02 ENCOUNTER — Ambulatory Visit (INDEPENDENT_AMBULATORY_CARE_PROVIDER_SITE_OTHER): Payer: BC Managed Care – PPO | Admitting: Primary Care

## 2015-11-02 VITALS — BP 108/68 | HR 75 | Temp 98.1°F | Ht 70.0 in | Wt 150.1 lb

## 2015-11-02 DIAGNOSIS — L237 Allergic contact dermatitis due to plants, except food: Secondary | ICD-10-CM

## 2015-11-02 MED ORDER — TRIAMCINOLONE ACETONIDE 0.1 % EX CREA: 1.0000 "application " | TOPICAL_CREAM | Freq: Two times a day (BID) | CUTANEOUS | Status: DC

## 2015-11-02 MED ORDER — PREDNISONE 10 MG PO TABS: ORAL_TABLET | ORAL | Status: DC

## 2015-11-02 NOTE — Progress Notes (Signed)
Subjective:    Patient ID: Cristina Wade, female    DOB: 02-Apr-1971, 45 y.o.   MRN: [id number]  HPI  Ms. Schlagel is a 45 year old female who presents today with a chief complaint of rash. Her rash is located to the bilateral lower extremities that she first noticed 1 week ago. She believes she got this from her sister who had signs of poison ivy 2 weeks ago. She and her sister were outdoors in the woods 2 weeks ago.   She's applied Triamcinolone cream for the past 2 days with some improvement in itching, but overall no improvement in the rash. She believes the rash is worse today.  Review of Systems  Constitutional: Negative for fever and chills.  Skin: Positive for rash.       Past Medical History  Diagnosis Date  . Frequent headaches   . Migraines   . UTI (lower urinary tract infection)   . Kidney stones   . Cystitis   . Osteopenia   . Endometriosis   . Microscopic hematuria   . Gross hematuria   . Cystocele   . Migraine   . Urinary disorder      Social History   Social History  . Marital Status: Single    Spouse Name: N/A  . Number of Children: N/A  . Years of Education: N/A   Occupational History  . Not on file.   Social History Main Topics  . Smoking status: Never Smoker   . Smokeless tobacco: Never Used  . Alcohol Use: No  . Drug Use: No  . Sexual Activity: No   Other Topics Concern  . Not on file   Social History Narrative   Lives with twin sister   Work- Film/video editor [employer]    No pets    No children    Right handed    No caffeine daily- tea occasionally; eats chocolate    Enjoys shopping, resting, spending time at the lake.     Past Surgical History  Procedure Laterality Date  . Gum surgery    . Laproscopy  2007  . Cystoscopy  2007    with biopsy   . Colonoscopy N/A 11/05/2014    Procedure: COLONOSCOPY;  Surgeon: Scot Jun, MD;  Location: [facility] ENDOSCOPY;  Service: Endoscopy;  Laterality: N/A;  . Dilatation &  curettage/hysteroscopy with myosure N/A 04/13/2015    Procedure: DILATATION & CURETTAGE/HYSTEROSCOPY WITH MYOSURE/POLYPECTOMY;  Surgeon: Elenora Fender Ward, MD;  Location: [facility] ORS;  Service: Gynecology;  Laterality: N/A;    Family History  Problem Relation Age of Onset  . Colon cancer Father 17  . Arthritis Father   . Hyperlipidemia Father   . Cancer Father     colon/prostate  . Arthritis Mother   . Heart disease Mother   . Stroke Mother     TIA  . Hypertension Mother   . Kidney disease Mother   . Diabetes Mother   . Cancer Maternal Grandmother   . Cancer Paternal Grandfather     prostate  . Transient ischemic attack Father     Allergies  Allergen Reactions  . Lac Bovis Other (See Comments)  . Milk-Related Compounds   . Penicillins     Current Outpatient Prescriptions on File Prior to Visit  Medication Sig Dispense Refill  . Calcium Carbonate-Vit D-Min (CALCIUM 1200 PO) Take by mouth. Reported on 05/26/2015    . levonorgestrel-ethinyl estradiol (SEASONALE,INTROVALE,JOLESSA) 0.15-0.03 MG tablet Take 1 tablet by mouth daily.    Marland Kitchen  Multiple Vitamin (MULTI-VITAMINS) TABS Take 1 tablet by mouth.     No current facility-administered medications on file prior to visit.    BP 108/68 mmHg  Pulse 75  Temp(Src) 98.1 F (36.7 C) (Oral)  Ht 5\' 10"  (1.778 m)  Wt 150 lb 1.9 oz (68.094 kg)  BMI 21.54 kg/m2  SpO2 98%    Objective:   Physical Exam  Constitutional: She appears well-nourished.  Cardiovascular: Normal rate and regular rhythm.   Pulmonary/Chest: Effort normal and breath sounds normal.  Skin: Skin is warm and dry. Rash noted.  Moderate poison ivy dermatitis to bilateral lower extremities. No open vesicles. Mild dermatitis to right upper extremity.           Assessment & Plan:  Poison Ivy/Oak Dermatitis:  Rash to bilateral lower extremities x 1 week. In the woods 2 weeks ago with her sister who developed poison ivy dermatitis. Some improvement in itching with  her sisters triamcinolone cream, overall rash is worse. Rash representative of poison ivy dermatitis. Prescription taper for Prednisone 12 day course provided. Use Triamcinolone or OTC cortisone PRN itching. Follow up PRN.  Morrie Sheldonlark,Katherine Kendal, NP

## 2015-11-02 NOTE — Progress Notes (Signed)
Pre visit review using our clinic review tool, if applicable. No additional management support is needed unless otherwise documented below in the visit note. 

## 2015-11-02 NOTE — Patient Instructions (Signed)
Your rash is related to poison ivy/oak.  Start prednisone tablets to help resolve rash.  Take 4 tablets for 3 days, then 3 tablets for 3 days, then 2 tablets for 3 days, then 1 tablet for 3 days.  You may continue to apply triamcinolone cream twice daily as needed for itching.  It was a pleasure to see you today!  Poison [employer] Poison ivy is a inflammation of the skin (contact dermatitis) caused by touching the allergens on the leaves of the ivy plant following previous exposure to the plant. The rash usually appears 48 hours after exposure. The rash is usually bumps (papules) or blisters (vesicles) in a linear pattern. Depending on your own sensitivity, the rash may simply cause redness and itching, or it may also progress to blisters which may break open. These must be well cared for to prevent secondary bacterial (germ) infection, followed by scarring. Keep any open areas dry, clean, dressed, and covered with an antibacterial ointment if needed. The eyes may also get puffy. The puffiness is worst in the morning and gets better as the day progresses. This dermatitis usually heals without scarring, within 2 to 3 weeks without treatment. HOME CARE INSTRUCTIONS  Thoroughly wash with soap and water as soon as you have been exposed to poison ivy. You have about one half hour to remove the plant resin before it will cause the rash. This washing will destroy the oil or antigen on the skin that is causing, or will cause, the rash. Be sure to wash under your fingernails as any plant resin there will continue to spread the rash. Do not rub skin vigorously when washing affected area. Poison ivy cannot spread if no oil from the plant remains on your body. A rash that has progressed to weeping sores will not spread the rash unless you have not washed thoroughly. It is also important to wash any clothes you have been wearing as these may carry active allergens. The rash will return if you wear the unwashed clothing,  even several days later. Avoidance of the plant in the future is the best measure. Poison ivy plant can be recognized by the number of leaves. Generally, poison ivy has three leaves with flowering branches on a single stem. Diphenhydramine may be purchased over the counter and used as needed for itching. Do not drive with this medication if it makes you drowsy.Ask your caregiver about medication for children. SEEK MEDICAL CARE IF:  Open sores develop.  Redness spreads beyond area of rash.  You notice purulent (pus-like) discharge.  You have increased pain.  Other signs of infection develop (such as fever).   This information is not intended to replace advice given to you by your health care provider. Make sure you discuss any questions you have with your health care provider.   Document Released: 03/31/2000 Document Revised: 06/26/2011 Document Reviewed: 09/09/2014 Elsevier Interactive Patient Education [employer]2016 Elsevier Inc.

## 2015-11-14 NOTE — Progress Notes (Signed)
2:22 PM   Davion Romenesko [birth year] [id number]  Referring provider: Carollee Leitz, NP [address] [place], [place] [address]  Chief Complaint  Patient presents with  . Follow-up    6 month Pelvic Floor Dysfunction    HPI: Patient is a 45 year old Caucasian female who presents today for 3 month follow-up after completing physical therapy.  Background history Patient stated was having difficulty urinating.  She often finds herself straining to urinate.  She has mild stress incontinence, losing urine when she laughs, coughs or sneezes.  The leakage is not severe enough to wear a pad.  She is consuming 24 oz of water with her breakfast, lunch and dinner.  Her PVR with [place] have been minimal.  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  She was recommended to undergo PT.    History of hematuria Her hematuria workup completed in 09/2013 did not reveal any GU pathology, but some possible punctate stones.  She has not had any gross hematuria.  She was referred to nephrology.  They did not find an etiology for her hematuria.    Dysfunctional voider/dysfunctional elimination syndrome Today, she is not experiencing dysuria or suprapubic pain.   She denies any fevers, chills, nausea or vomiting.   She has noticed significant improvement with the PT.  She is voiding sooner, not having to strain and is emptying her bladder.  She was given exercises to do at home and was instructed to contact PT if she should worsen.     PMH: Past Medical History:  Diagnosis Date  . Cystitis   . Cystocele   . Endometriosis   . Frequent headaches   . Gross hematuria   . Kidney stones   . Microscopic hematuria   . Migraine   . Migraines   . Osteopenia   . Urinary disorder   . UTI (lower urinary tract infection)     Surgical History: Past Surgical History:  Procedure Laterality Date  . COLONOSCOPY N/A 11/05/2014   Procedure:  COLONOSCOPY;  Surgeon: Scot Jun, MD;  Location: [facility] ENDOSCOPY;  Service: Endoscopy;  Laterality: N/A;  . CYSTOSCOPY  2007   with biopsy   . DILATATION & CURETTAGE/HYSTEROSCOPY WITH MYOSURE N/A 04/13/2015   Procedure: DILATATION & CURETTAGE/HYSTEROSCOPY WITH MYOSURE/POLYPECTOMY;  Surgeon: Elenora Fender Ward, MD;  Location: [facility] ORS;  Service: Gynecology;  Laterality: N/A;  . GUM SURGERY    . laproscopy  2007    Home Medications:    Medication List       Accurate as of 11/15/15  2:22 PM. Always use your most recent med list.          CALCIUM 1200 PO Take by mouth. Reported on 05/26/2015   levonorgestrel-ethinyl estradiol 0.15-0.03 MG tablet Commonly known as:  SEASONALE,INTROVALE,JOLESSA Take 1 tablet by mouth daily.   MULTI-VITAMINS Tabs Take 1 tablet by mouth.   predniSONE 10 MG tablet Commonly known as:  DELTASONE Take 4 tablets daily for 3 days, then 3 tablets for 3 days, then 2 tablets for 3 days, then 1 tablet for 3 days.   RA VITAMIN B-12 TR 1000 MCG Tbcr Generic drug:  Cyanocobalamin Take by mouth.   triamcinolone cream 0.1 % Commonly known as:  KENALOG Apply 1 application topically 2 (two) times daily.       Allergies:  Allergies  Allergen Reactions  . Lac Bovis Other (See Comments)  . Milk-Related Compounds   . Penicillins  Family History: Family History  Problem Relation Age of Onset  . Colon cancer Father 39  . Arthritis Father   . Hyperlipidemia Father   . Cancer Father     colon/prostate  . Transient ischemic attack Father   . Arthritis Mother   . Heart disease Mother   . Stroke Mother     TIA  . Hypertension Mother   . Kidney disease Mother   . Diabetes Mother   . Cancer Maternal Grandmother   . Cancer Paternal Grandfather     prostate    Social History:  reports that she has never smoked. She has never used smokeless tobacco. She reports that she does not drink alcohol or use drugs.  ROS: Urological Symptom  Review  Patient is experiencing the following symptoms: Frequent urination Trouble starting stream Have to strain to urinate Blood in urine   Review of Systems  Gastrointestinal (upper)  : Negative for upper GI symptoms  Gastrointestinal (lower) : Constipation  Constitutional : Negative for symptoms  Skin: Negative for skin symptoms  Eyes: Negative for eye symptoms  Ear/Nose/Throat : Negative for Ear/Nose/Throat symptoms  Hematologic/Lymphatic: Negative for Hematologic/Lymphatic symptoms  Cardiovascular : Negative for cardiovascular symptoms  Respiratory : Negative for respiratory symptoms  Endocrine: Negative for endocrine symptoms  Musculoskeletal: Negative for musculoskeletal symptoms  Neurological: Negative for neurological symptoms  Psychologic: Negative for psychiatric symptoms   Physical Exam: BP 101/61   Pulse 68   Ht 5\' 10"  (1.778 m)   Wt 151 lb (68.5 kg)   LMP 08/29/2015   BMI 21.67 kg/m   Constitutional: Well nourished. Alert and oriented, No acute distress. HEENT: [place] AT, moist mucus membranes. Trachea midline, no masses. Cardiovascular: No clubbing, cyanosis, or edema. Respiratory: Normal respiratory effort, no increased work of breathing. Skin: No rashes, bruises or suspicious lesions. Lymph: No cervical or inguinal adenopathy. Neurologic: Grossly intact, no focal deficits, moving all 4 extremities. Psychiatric: Normal mood and affect.  Laboratory Data: Results for orders placed or performed in visit on 09/30/15  CBC  Result Value Ref Range   WBC 7.5 4.0 - 10.5 K/uL   RBC 4.55 3.87 - 5.11 Mil/uL   Platelets 249.0 150.0 - 400.0 K/uL   Hemoglobin 13.9 12.0 - 15.0 g/dL   HCT 16.1 09.6 - 04.5 %   MCV 90.7 78.0 - 100.0 fl   MCHC 33.8 30.0 - 36.0 g/dL   RDW 40.9 81.1 - 91.4 %  Comprehensive metabolic panel  Result Value Ref Range   Sodium 138 135 - 145 mEq/L   Potassium 4.2 3.5 - 5.1 mEq/L   Chloride 104 96 - 112 mEq/L    CO2 28 19 - 32 mEq/L   Glucose, Bld 85 70 - 99 mg/dL   BUN 12 6 - 23 mg/dL   Creatinine, Ser 7.82 0.40 - 1.20 mg/dL   Total Bilirubin 0.8 0.2 - 1.2 mg/dL   Alkaline Phosphatase 29 (L) 39 - 117 U/L   AST 12 0 - 37 U/L   ALT 11 0 - 35 U/L   Total Protein 7.5 6.0 - 8.3 g/dL   Albumin 4.4 3.5 - 5.2 g/dL   Calcium 9.4 8.4 - 95.6 mg/dL   GFR 21.30 >86.57 mL/min  Hemoglobin A1c  Result Value Ref Range   Hgb A1c MFr Bld 5.5 4.6 - 6.5 %  Lipid panel  Result Value Ref Range   Cholesterol 185 0 - 200 mg/dL   Triglycerides 84.6 0.0 - 149.0 mg/dL   HDL 96.29 >  39.00 mg/dL   VLDL 47.8 0.0 - 29.5 mg/dL   LDL Cholesterol 621 (H) 0 - 99 mg/dL   Total CHOL/HDL Ratio 4    NonHDL 138.03    Lab Results  Component Value Date   WBC 7.5 09/30/2015   HGB 13.9 09/30/2015   HCT 41.3 09/30/2015   MCV 90.7 09/30/2015   PLT 249.0 09/30/2015    Lab Results  Component Value Date   CREATININE 0.78 09/30/2015      Lab Results  Component Value Date   HGBA1C 5.5 09/30/2015    Urinalysis Not significant for hematuria.  See EPIC.   Assessment & Plan:    1. Microscopic hematuria:   Patient underwent a hematuria workup with CT urogram and cystoscopy in 2015 which was negative for GU pathology, some possible punctate bilateral stones.  Nephrology did not find an etiology for her hematuria.  UA did not have hematuria.  RTC in one year for repeat UA.    - Urinalysis, Complete  2. Straining to urinate:   Patient was found to be dysfunctional voider/dysfunctional elimination syndrome on UDS.  She has completed PT.  She has noted great improvement in her urinary symptoms.  She will continue to do the home exercises.  She will RTC in one year for symptom recheck.     Return in about 1 year (around 11/14/2016) for UA and symptom recheck.  Michiel Cowboy, PA-C  [facility] [address], [address] [place], [place] [address] [phone]

## 2015-11-15 ENCOUNTER — Encounter: Payer: Self-pay | Admitting: Urology

## 2015-11-15 ENCOUNTER — Ambulatory Visit (INDEPENDENT_AMBULATORY_CARE_PROVIDER_SITE_OTHER): Payer: BC Managed Care – PPO | Admitting: Urology

## 2015-11-15 VITALS — BP 101/61 | HR 68 | Ht 70.0 in | Wt 151.0 lb

## 2015-11-15 DIAGNOSIS — Z87448 Personal history of other diseases of urinary system: Secondary | ICD-10-CM | POA: Diagnosis not present

## 2015-11-15 DIAGNOSIS — M6289 Other specified disorders of muscle: Secondary | ICD-10-CM

## 2015-11-15 DIAGNOSIS — N8184 Pelvic muscle wasting: Secondary | ICD-10-CM

## 2015-11-16 LAB — URINALYSIS, COMPLETE
BILIRUBIN UA: NEGATIVE
GLUCOSE, UA: NEGATIVE
Ketones, UA: NEGATIVE
NITRITE UA: NEGATIVE
PH UA: 7.5 (ref 5.0–7.5)
PROTEIN UA: NEGATIVE
Specific Gravity, UA: 1.015 (ref 1.005–1.030)
UUROB: 0.2 mg/dL (ref 0.2–1.0)

## 2015-11-16 LAB — MICROSCOPIC EXAMINATION
Bacteria, UA: NONE SEEN
Epithelial Cells (non renal): >10 /hpf — AB (ref 0–10)

## 2015-11-30 ENCOUNTER — Encounter: Payer: Self-pay | Admitting: Internal Medicine

## 2015-11-30 ENCOUNTER — Ambulatory Visit (INDEPENDENT_AMBULATORY_CARE_PROVIDER_SITE_OTHER): Payer: BC Managed Care – PPO | Admitting: Internal Medicine

## 2015-11-30 VITALS — BP 104/70 | HR 73 | Temp 98.7°F | Wt 149.0 lb

## 2015-11-30 DIAGNOSIS — R109 Unspecified abdominal pain: Secondary | ICD-10-CM

## 2015-11-30 LAB — POC URINALSYSI DIPSTICK (AUTOMATED)
BILIRUBIN UA: NEGATIVE
Glucose, UA: NEGATIVE
KETONES UA: NEGATIVE
NITRITE UA: NEGATIVE
PROTEIN UA: NEGATIVE
Spec Grav, UA: 1.015
Urobilinogen, UA: NEGATIVE
pH, UA: 7

## 2015-11-30 NOTE — Progress Notes (Signed)
Pre visit review using our clinic review tool, if applicable. No additional management support is needed unless otherwise documented below in the visit note. 

## 2015-11-30 NOTE — Progress Notes (Signed)
Subjective:    Patient ID: Cristina CorpusSharon Holzheimer, female    DOB: [birth year], 45 y.o.   MRN: [id number]  HPI  Pt presents to the clinic today with a complaint of left and right flank pain x 2-3 weeks.  She describes the pain as intermittent dull pain with occasional sharp pains at a severity of 6-8/10.  She reports the pain is worse on the left side.  She denies chills, fever, urinary frequency, urgency, pain with urination, nausea, vomiting, or abdominal pain.  She has not tried anything for her symptoms.  She reports a history of chronic hematuria x 3 years, and has occasional trouble initiating a urine stream but states this has improved with PT.  She reports she was seen by Urology 2 weeks ago and was negative for infection.    Review of Systems Past Medical History:  Diagnosis Date  . Cystitis   . Cystocele   . Endometriosis   . Frequent headaches   . Gross hematuria   . Kidney stones   . Microscopic hematuria   . Migraine   . Migraines   . Osteopenia   . Urinary disorder   . UTI (lower urinary tract infection)    Past Surgical History:  Procedure Laterality Date  . COLONOSCOPY N/A 11/05/2014   Procedure: COLONOSCOPY;  Surgeon: Scot Junobert T Elliott, MD;  Location: [facility] ENDOSCOPY;  Service: Endoscopy;  Laterality: N/A;  . CYSTOSCOPY  2007   with biopsy   . DILATATION & CURETTAGE/HYSTEROSCOPY WITH MYOSURE N/A 04/13/2015   Procedure: DILATATION & CURETTAGE/HYSTEROSCOPY WITH MYOSURE/POLYPECTOMY;  Surgeon: Elenora Fenderhelsea C Ward, MD;  Location: [facility] ORS;  Service: Gynecology;  Laterality: N/A;  . GUM SURGERY    . laproscopy  2007   Family History  Problem Relation Age of Onset  . Colon cancer Father [id number]  . Arthritis Father   . Hyperlipidemia Father   . Cancer Father     colon/prostate  . Transient ischemic attack Father   . Arthritis Mother   . Heart disease Mother   . Stroke Mother     TIA  . Hypertension Mother   . Kidney disease Mother   . Diabetes Mother   . Cancer Maternal Grandmother    . Cancer Paternal Grandfather     prostate   Current Outpatient Prescriptions on File Prior to Visit  Medication Sig Dispense Refill  . Calcium Carbonate-Vit D-Min (CALCIUM 1200 PO) Take by mouth. Reported on 05/26/2015    . Cyanocobalamin (RA VITAMIN B-12 TR) 1000 MCG TBCR Take by mouth.    . levonorgestrel-ethinyl estradiol (SEASONALE,INTROVALE,JOLESSA) 0.15-0.03 MG tablet Take 1 tablet by mouth daily.    . Multiple Vitamin (MULTI-VITAMINS) TABS Take 1 tablet by mouth.    . triamcinolone cream (KENALOG) 0.1 % Apply 1 application topically 2 (two) times daily. 30 g 0   No current facility-administered medications on file prior to visit.    Allergies  Allergen Reactions  . Lac Bovis Other (See Comments)  . Milk-Related Compounds   . Penicillins     Const: Denies fever or chills. GI: Denies nausea, vomiting, or abdominal pain. GU: Pt reports chronic hematuria and occasional trouble initiating urine stream.  Denies urinary urgency, frequency, or pain with urination. MSK: Pt reports bilateral flank pain.        Objective:   Physical Exam  BP 104/70   Pulse 73   Temp 98.7 F (37.1 C) (Oral)   Wt 149 lb (67.6 kg)   LMP 08/29/2015   SpO2 98%  BMI 21.38 kg/m   General: Well-appearing, in no acute distress. Chest/Breast: Chest wall nontender to palpation. Pulm: Clear to auscultation bilaterally.  No wheezes, rales, or rhonchi. GI: Positive bowel sounds throughout.  Soft, nondistended, nontender to palpation. GU: Negative CVA tenderness. MSK: Lumbar spine nontender to palpation.  Left paraspinals tender to palpation.  No tenderness of right paraspinals.  Full AROM of lumbar spine, no pain with movement.      Assessment & Plan:   Bilateral flank pain:  UA showed 1+ leukocytes and trace blood, same results as UA completed by Urologist 2 weeks ago Culture pending Try heating pad Follow-up based on culture results  Call if symptoms worsen or do not improve BAITY,  REGINA, NP

## 2015-11-30 NOTE — Patient Instructions (Signed)

## 2015-12-01 LAB — URINE CULTURE

## 2015-12-02 ENCOUNTER — Other Ambulatory Visit: Payer: Self-pay | Admitting: Internal Medicine

## 2015-12-02 MED ORDER — CEPHALEXIN 500 MG PO CAPS: 500.0000 mg | ORAL_CAPSULE | Freq: Three times a day (TID) | ORAL | 0 refills | Status: DC

## 2015-12-05 ENCOUNTER — Emergency Department
Admission: EM | Admit: 2015-12-05 | Discharge: 2015-12-05 | Disposition: A | Discharge status: Home / Self Care | Payer: BC Managed Care – PPO | Attending: Emergency Medicine | Admitting: Emergency Medicine

## 2015-12-05 ENCOUNTER — Emergency Department: Payer: BC Managed Care – PPO

## 2015-12-05 DIAGNOSIS — R0789 Other chest pain: Secondary | ICD-10-CM | POA: Diagnosis not present

## 2015-12-05 DIAGNOSIS — R51 Headache: Secondary | ICD-10-CM | POA: Insufficient documentation

## 2015-12-05 DIAGNOSIS — K2970 Gastritis, unspecified, without bleeding: Secondary | ICD-10-CM

## 2015-12-05 DIAGNOSIS — R109 Unspecified abdominal pain: Secondary | ICD-10-CM | POA: Diagnosis present

## 2015-12-05 LAB — CBC
HEMATOCRIT: 41 % (ref 35.0–47.0)
HEMOGLOBIN: 14 g/dL (ref 12.0–16.0)
MCH: 31 pg (ref 26.0–34.0)
MCHC: 34.1 g/dL (ref 32.0–36.0)
MCV: 90.8 fL (ref 80.0–100.0)
Platelets: 254 10*3/uL (ref 150–440)
RBC: 4.52 MIL/uL (ref 3.80–5.20)
RDW: 13.1 % (ref 11.5–14.5)
WBC: 7 10*3/uL (ref 3.6–11.0)

## 2015-12-05 LAB — URINALYSIS COMPLETE WITH MICROSCOPIC (ARMC ONLY)
Bacteria, UA: NONE SEEN
Bilirubin Urine: NEGATIVE
Glucose, UA: NEGATIVE mg/dL
Hgb urine dipstick: NEGATIVE
KETONES UR: NEGATIVE mg/dL
Nitrite: NEGATIVE
PH: 8 (ref 5.0–8.0)
PROTEIN: NEGATIVE mg/dL
RBC / HPF: NONE SEEN RBC/hpf (ref 0–5)
SPECIFIC GRAVITY, URINE: 1.005 (ref 1.005–1.030)

## 2015-12-05 LAB — TROPONIN I

## 2015-12-05 LAB — BASIC METABOLIC PANEL
ANION GAP: 6 (ref 5–15)
BUN: 8 mg/dL (ref 6–20)
CHLORIDE: 105 mmol/L (ref 101–111)
CO2: 29 mmol/L (ref 22–32)
Calcium: 9.8 mg/dL (ref 8.9–10.3)
Creatinine, Ser: 0.76 mg/dL (ref 0.44–1.00)
GFR calc non Af Amer: >60 mL/min (ref >60)
GLUCOSE: 94 mg/dL (ref 65–99)
POTASSIUM: 3.8 mmol/L (ref 3.5–5.1)
Sodium: 140 mmol/L (ref 135–145)

## 2015-12-05 LAB — POCT PREGNANCY, URINE: PREG TEST UR: NEGATIVE

## 2015-12-05 MED ORDER — RANITIDINE HCL 150 MG PO TABS: 150.0000 mg | ORAL_TABLET | Freq: Two times a day (BID) | ORAL | 0 refills | Status: DC

## 2015-12-05 MED ORDER — SUCRALFATE 1 G PO TABS: 1.0000 g | ORAL_TABLET | Freq: Four times a day (QID) | ORAL | 0 refills | Status: DC

## 2015-12-05 MED ORDER — GI COCKTAIL ~~LOC~~
30.0000 mL | Freq: Once | ORAL | Status: AC
  Administered 2015-12-05: 30 mL via ORAL
  Filled 2015-12-05: qty 30

## 2015-12-05 NOTE — Discharge Instructions (Signed)
Please seek medical attention for any high fevers, chest pain, shortness of breath, change in behavior, persistent vomiting, bloody stool or any other new or concerning symptoms.  

## 2015-12-05 NOTE — ED Triage Notes (Signed)
Pt reports chest pain for past 2-3 days and lower back and flank. Reports sweats and chills. Last Tuesday PCP treated for UTI. Denies fever. Denies cold symptoms. C/o nausea denies vomiting or diarrhea.

## 2015-12-05 NOTE — ED Provider Notes (Signed)
[facility] Emergency Department Provider Note   ____________________________________________   I have reviewed the triage vital signs and the nursing notes.   HISTORY  Chief Complaint Chest Pain and Back Pain   History limited by: Not Limited   HPI Cristina Wade is a 45 y.o. female presents to the emergency department today because of concerns for bilateral flank pain, chest pain and headache. The bilateral flank pain was the first symptom she had and it started last week. She saw primary care doctor who started her on antibiotics for urinary tract infection. For the past four days she has had intermittent left sided chest pain. She describes it as a tightness. She has not had any associated shortness of breath or fever. In addition for roughly the past 4 days she has also had a headache. She has only tried aspirin for her symptoms which hasnot been completely effective.    Past Medical History:  Diagnosis Date  . Cystitis   . Cystocele   . Endometriosis   . Frequent headaches   . Gross hematuria   . Kidney stones   . Microscopic hematuria   . Migraine   . Migraines   . Osteopenia   . Urinary disorder   . UTI (lower urinary tract infection)         Patient Active Problem List   Diagnosis Date Noted  . Preventative health care 09/30/2015  . Microscopic hematuria 10/22/2014  . Straining during urination 10/22/2014  . Vestibular migraine 09/28/2014  . Excessive cerumen in both ear canals 09/28/2014  . Encounter for screening colonoscopy 11/21/2013         Past Surgical History:  Procedure Laterality Date  . COLONOSCOPY N/A 11/05/2014   Procedure: COLONOSCOPY;  Surgeon: Scot Jun, MD;  Location: [facility] ENDOSCOPY;  Service: Endoscopy;  Laterality: N/A;  . CYSTOSCOPY  2007   with biopsy   . DILATATION & CURETTAGE/HYSTEROSCOPY WITH MYOSURE N/A 04/13/2015   Procedure: DILATATION & CURETTAGE/HYSTEROSCOPY WITH  MYOSURE/POLYPECTOMY;  Surgeon: Elenora Fender Ward, MD;  Location: [facility] ORS;  Service: Gynecology;  Laterality: N/A;  . GUM SURGERY    . laproscopy  2007           Prior to Admission medications   Medication Sig Start Date End Date Taking? Authorizing Provider  Calcium Carbonate-Vit D-Min (CALCIUM 1200 PO) Take by mouth. Reported on 05/26/2015    Historical Provider, MD  cephALEXin (KEFLEX) 500 MG capsule Take 1 capsule (500 mg total) by mouth 3 (three) times daily. 12/02/15   Lorre Munroe, NP  Cyanocobalamin (RA VITAMIN B-12 TR) 1000 MCG TBCR Take by mouth.    Historical Provider, MD  levonorgestrel-ethinyl estradiol (SEASONALE,INTROVALE,JOLESSA) 0.15-0.03 MG tablet Take 1 tablet by mouth daily.    Historical Provider, MD  Multiple Vitamin (MULTI-VITAMINS) TABS Take 1 tablet by mouth.    Historical Provider, MD  triamcinolone cream (KENALOG) 0.1 % Apply 1 application topically 2 (two) times daily. 11/02/15   Doreene Nest, NP    Allergies Lac bovis; Milk-related compounds; and Penicillins        Family History  Problem Relation Age of Onset  . Colon cancer Father 75  . Arthritis Father   . Hyperlipidemia Father   . Cancer Father     colon/prostate  . Transient ischemic attack Father   . Arthritis Mother   . Heart disease Mother   . Stroke Mother     TIA  . Hypertension Mother   . Kidney disease Mother   .  Diabetes Mother   . Cancer Maternal Grandmother   . Cancer Paternal Grandfather     prostate    Social History     Social History  Substance Use Topics  . Smoking status: Never Smoker  . Smokeless tobacco: Never Used  . Alcohol use No    Review of Systems  Constitutional: Negative for fever. Cardiovascular: Positive for chest pain. Respiratory: Negative for shortness of breath. Gastrointestinal: Negative for abdominal pain, vomiting and diarrhea. Bilateral flank pain. Genitourinary: Negative for  dysuria. Musculoskeletal: Negative for back pain. Skin: Negative for rash. Neurological: Negative for headaches, focal weakness or numbness.  10-point ROS otherwise negative.  ____________________________________________   PHYSICAL EXAM:  VITAL SIGNS:     ED Triage Vitals  Enc Vitals Group     BP 12/05/15 1416 120/79     Pulse Rate 12/05/15 1416 69     Resp 12/05/15 1416 18     Temp 12/05/15 1416 97.9 F (36.6 C)     Temp Source 12/05/15 1416 Oral     SpO2 12/05/15 1416 100 %     Weight 12/05/15 1417 150 lb (68 kg)     Height 12/05/15 1417 5\' 10"  (1.778 m)     Head Circumference --      Peak Flow --      Pain Score 12/05/15 1419 8   Constitutional: Alert and oriented. Well appearing and in no distress. Eyes: Conjunctivae are normal. PERRL. Normal extraocular movements. ENT   Head: Normocephalic and atraumatic.   Nose: No congestion/rhinnorhea.   Mouth/Throat: Mucous membranes are moist.   Neck: No stridor. Hematological/Lymphatic/Immunilogical: No cervical lymphadenopathy. Cardiovascular: Normal rate, regular rhythm.  No murmurs, rubs, or gallops. Respiratory: Normal respiratory effort without tachypnea nor retractions. Breath sounds are clear and equal bilaterally. No wheezes/rales/rhonchi. Gastrointestinal: Soft and nontender. No distention. There is no CVA tenderness. Genitourinary: Deferred Musculoskeletal: Normal range of motion in all extremities. No joint effusions.  No lower extremity tenderness nor edema. Neurologic:  Normal speech and language. No gross focal neurologic deficits are appreciated.  Skin:  Skin is warm, dry and intact. No rash noted. Psychiatric: Mood and affect are normal. Speech and behavior are normal. Patient exhibits appropriate insight and judgment.  ____________________________________________    LABS (pertinent positives/negatives)  Labs Reviewed  URINALYSIS COMPLETEWITH MICROSCOPIC ([facility] ONLY) - Abnormal;  Notable for the following:       Result Value   Color, Urine YELLOW (*)    APPearance CLOUDY (*)    Leukocytes, UA 1+ (*)    Squamous Epithelial / LPF 6-30 (*)    All other components within normal limits  BASIC METABOLIC PANEL  CBC  TROPONIN I  POC URINE PREG, ED  POCT PREGNANCY, URINE    ____________________________________________   EKG  I, Phineas SemenGraydon Garry Bochicchio, attending physician, personally viewed and interpreted this EKG  EKG Time: 1435 Rate: 73 Rhythm: normal sinus rhythm Axis: normal Intervals: qtc 440 QRS: narrow, q waves V1, V2 ST changes: no st elevation Impression: abnormal ekg   ____________________________________________    RADIOLOGY  None  ____________________________________________   PROCEDURES  Procedures  ____________________________________________   INITIAL IMPRESSION / ASSESSMENT AND PLAN / ED COURSE  Pertinent labs & imaging results that were available during my care of the patient were reviewed by me and considered in my medical decision making (see chart for details).  Patient presented to the emergency department today because of concern for chest pain, headache and bilateral flank pain. UA without concerning findings. Patient is  currently on antibiotics for a UTI. Chest pain did improve after GI cocktail. Think gastritis possible given antibiotic use. Blood work without concerning findings. ____________________________________________   FINAL CLINICAL IMPRESSION(S) / ED DIAGNOSES  Gastritis Atypical chest pain  Note: This dictation was prepared with Dragon dictation. Any transcriptional errors that result from this process are unintentional         Phineas SemenGraydon Adren Dollins, MD 12/05/15 [phone]

## 2015-12-06 ENCOUNTER — Telehealth: Payer: Self-pay

## 2015-12-06 ENCOUNTER — Telehealth: Payer: Self-pay | Admitting: Urology

## 2015-12-06 NOTE — Telephone Encounter (Signed)
PLEASE NOTE: All timestamps contained within this report are represented as [place] Time. CONFIDENTIALTY NOTICE: This fax transmission is intended only for the addressee. It contains information that is legally privileged, confidential or otherwise protected from use or disclosure. If you are not the intended recipient, you are strictly prohibited from reviewing, disclosing, copying using or disseminating any of this information or taking any action in reliance on or regarding this information. If you have received this fax in error, please notify us immediately by telephone so that we can arrange for its return to us. Phone: [phone], Toll-Free: [phone], Fax: [phone] Page: 1 of 2 Call Id: [id number] [facility] - Client TELEPHONE ADVICE RECORD [facility] Patient Name: Cristina Wade Gender: Female DOB: [birth year] Age: [age] Y 3 M 1 D Return Phone Number: [phone] (Primary) Address: City/State/Zip: [place] Client [facility] Day Physician Nicki ReaperBaity, Regina - NP Contact Type Call Who Is Calling Patient / Member / Family / Caregiver Call Type Triage / Clinical Relationship To Patient Self Return Phone Number [phone] (Primary) Chief Complaint CHEST PAIN (>=21 years) - pain, pressure, heaviness or tightness Reason for Call Symptomatic / Request for Health Information Initial Comment Caller States she is on a new medication, she is having pain in her kidneys, chest, and head, chest pain feels like a tightness Appointment Disposition EMR Appointment Not Necessary Info pasted into Epic No PreDisposition Did not know what to do Translation No Nurse Assessment Nurse: Debera Latalston, RN, Tinnie GensJeffrey Date/Time (Eastern Time): 12/05/2015 1:17:55 PM Confirm and document reason for call. If symptomatic, describe symptoms. You must click the next button to save  text entered. ---Caller States she is on a new medication, she is having pain in her kidneys, chest, and head, chest pain feels like a tightness. Been on Cephalexin for 3 days. Headache and chest pain for 2 days. Being treated for UTI. Has the patient traveled out of the country within the last 30 days? ---No Does the patient have any new or worsening symptoms? ---Yes Will a triage be completed? ---Yes Related visit to physician within the last 2 weeks? ---Yes Does the PT have any chronic conditions? (i.e. diabetes, asthma, etc.) ---No Is the patient pregnant or possibly pregnant? (Ask all females between the ages of 412-55) ---No Is this a behavioral health or substance abuse call? ---No Guidelines Guideline Title Affirmed Question Affirmed Notes Nurse Date/Time (Eastern Time) Chest Pain Pain also present in shoulder(s) or arm(s) or jaw (Exception: pain is Debera LatRalston, Charity fundraiserN, Tinnie GensJeffrey 12/05/2015 1:21:02 PM PLEASE NOTE: All timestamps contained within this report are represented as [place] Standard Time. CONFIDENTIALTY NOTICE: This fax transmission is intended only for the addressee. It contains information that is legally privileged, confidential or otherwise protected from use or disclosure. If you are not the intended recipient, you are strictly prohibited from reviewing, disclosing, copying using or disseminating any of this information or taking any action in reliance on or regarding this information. If you have received this fax in error, please notify us immediately by telephone so that we can arrange for its return to us. Phone: [phone], Toll-Free: [phone], Fax: [phone] Page: 2 of 2 Call Id: [id number] Guidelines Guideline Title Affirmed Question Affirmed Notes Nurse Date/Time Lamount Cohen(Eastern Time) clearly made worse by movement) Disp. Time Lamount Cohen(Eastern Time) Disposition Final User 12/05/2015 1:13:03 PM Send To Call Back Waiting For Nurse Bradley Ferrisaris, Julia 12/05/2015 1:13:03 PM  Send To  Call Back Waiting For Nurse Bradley Ferrisaris, Julia 12/05/2015 1:13:03 PM Send To Call Back Waiting For Nurse Bradley Ferrisaris, Julia 12/05/2015 1:13:04 PM Send To Call Back Waiting For Nurse Bradley Ferrisaris, Julia 12/05/2015 1:13:04 PM Send To Call Back Waiting For Nurse Bradley Ferrisaris, Julia 12/05/2015 1:13:08 PM Send to Urgent Queue Bradley Ferrisaris, Julia 12/05/2015 1:22:07 PM Go to ED Now Yes Debera Latalston, RN, Irma NewnessJeffrey Caller Understands: Yes Disagree/Comply: Comply Care Advice Given Per Guideline GO TO ED NOW: You need to be seen in the Emergency Department. Go to the ER at _________[facility] Hospital. Leave now. Drive carefully. NOTE TO TRIAGER - DRIVING: * Another adult should drive. * If immediate transportation is not available via car or taxi, then the patient should be instructed to call [id number]. CALL EMS IF: * Severe difficulty breathing occurs * Passes out or becomes too weak to stand * You become worse. CARE ADVICE given per Chest Pain (Adult) guideline. Referrals [facility] - ED

## 2015-12-06 NOTE — Telephone Encounter (Signed)
Noted and reviewed chat.

## 2015-12-06 NOTE — Telephone Encounter (Signed)
Would like to have the patient submit an UA to check for blood and infection prior to ordering a CT.

## 2015-12-06 NOTE — Telephone Encounter (Signed)
Per chart review tab pt was [facility] on 12/05/15.

## 2015-12-07 NOTE — Telephone Encounter (Signed)
Spoke with pt in reference to CT and urine. Pt then stated that she was in the ER on Sunday. Per pt ER checked her urine and stated it was clear, but her PCP put her on an abx. Pt was unsure as to why she was taking abx. Made pt aware 3-5 days after she completed the abx is when we would need to do a u/a. Pt requested a CT. Reinforced with pt a CT can not be ordered until we have a u/a. Pt voiced understanding of whole conversation.

## 2015-12-08 ENCOUNTER — Ambulatory Visit: Payer: BC Managed Care – PPO

## 2015-12-10 ENCOUNTER — Telehealth: Payer: Self-pay

## 2015-12-10 ENCOUNTER — Other Ambulatory Visit: Payer: BC Managed Care – PPO

## 2015-12-10 DIAGNOSIS — N390 Urinary tract infection, site not specified: Secondary | ICD-10-CM

## 2015-12-10 LAB — MICROSCOPIC EXAMINATION: Bacteria, UA: NONE SEEN

## 2015-12-10 LAB — URINALYSIS, COMPLETE
BILIRUBIN UA: NEGATIVE
Glucose, UA: NEGATIVE
KETONES UA: NEGATIVE
Nitrite, UA: NEGATIVE
PH UA: 7 (ref 5.0–7.5)
Protein, UA: NEGATIVE
SPEC GRAV UA: 1.015 (ref 1.005–1.030)
Urobilinogen, Ur: 0.2 mg/dL (ref 0.2–1.0)

## 2015-12-10 NOTE — Telephone Encounter (Signed)
Pt walked in to drop off post abx urine.

## 2015-12-13 LAB — CULTURE, URINE COMPREHENSIVE

## 2015-12-16 ENCOUNTER — Telehealth: Payer: Self-pay | Admitting: *Deleted

## 2015-12-16 NOTE — Telephone Encounter (Signed)
Patient calling asking for urine results. It looks like they were ordered and sent to Dr. Sherron MondayMacDiarmid and we have not received a result note about the culture yet. I will show the culture results to Dr. Sherryl BartersBudzyn who is here today to see what I can tell the patient. Per Dr. Sherryl BartersBudzyn this is contaminate from the vagina and we don't treat this. Called and spoke to patient to let her know. Patient understands. Patient states she has low back pain, what to do about it. Per Dr. Sherryl BartersBudzyn contact your PCP for the back pain it is not from an infection, and if she is still worried about it she can come in and have a cath urine specimen

## 2015-12-16 NOTE — Telephone Encounter (Signed)
Patient declined coming in for cath specimen and wants a follow up appointment with Carollee HerterShannon to discuss low back pain to see if it is coming from a possible stone. Patient transferred up front to make an appointment.

## 2015-12-17 ENCOUNTER — Telehealth: Payer: Self-pay

## 2015-12-17 NOTE — Telephone Encounter (Signed)
Pt was last seen on 11/30/15; pt finished abx and recently saw urologist and was advised no infection and to contact PCP; pt said her kidneys hurt on and off. Pt wonders if she should have a scan done to see if possible kidney stones. Pt does not expect cb today and will wait to hear back on Tues. If pt needs anything prior to Tues pt will cb and speak with oncall doctor or go to ED>

## 2015-12-19 NOTE — Telephone Encounter (Signed)
Since her PCP is out of office, I would recommend she make follow up appt with me to discuss.

## 2015-12-21 NOTE — Telephone Encounter (Signed)
Left detailed msg on VM per HIPAA  

## 2015-12-23 ENCOUNTER — Encounter: Payer: Self-pay | Admitting: Internal Medicine

## 2015-12-23 ENCOUNTER — Ambulatory Visit (INDEPENDENT_AMBULATORY_CARE_PROVIDER_SITE_OTHER): Payer: BC Managed Care – PPO | Admitting: Internal Medicine

## 2015-12-23 VITALS — BP 112/66 | HR 78 | Temp 98.2°F | Wt 152.0 lb

## 2015-12-23 DIAGNOSIS — M545 Low back pain, unspecified: Secondary | ICD-10-CM

## 2015-12-23 NOTE — Patient Instructions (Signed)

## 2015-12-23 NOTE — Progress Notes (Signed)
Subjective:    Patient ID: Cristina Wade, female    DOB: 1970-06-20, 45 y.o.   MRN: [id number]  HPI  Pt presents to the clinic today with c/o ongoing bilateral flank pain. She reports this has been intermittent over the last 4 weeks. She has been seen here on 8/15 for the same and by urology. The pain is located on both sides. She describes the pain as sharp. It seems worse with laying down and sitting in certain positions. She denies changes in bowel or blood in her stool. She denies urinary or vaginal complaints. She has not taken anything OTC for this. She is concerned because her mother had a bladder tumor.  Review of Systems  Past Medical History:  Diagnosis Date  . Cystitis   . Cystocele   . Endometriosis   . Frequent headaches   . Gross hematuria   . Kidney stones   . Microscopic hematuria   . Migraine   . Migraines   . Osteopenia   . Urinary disorder   . UTI (lower urinary tract infection)     Current Outpatient Prescriptions  Medication Sig Dispense Refill  . Calcium Carbonate-Vit D-Min (CALCIUM 1200 PO) Take by mouth. Reported on 05/26/2015    . Cyanocobalamin (RA VITAMIN B-12 TR) 1000 MCG TBCR Take by mouth.    . levonorgestrel-ethinyl estradiol (SEASONALE,INTROVALE,JOLESSA) 0.15-0.03 MG tablet Take 1 tablet by mouth daily.    . Multiple Vitamin (MULTI-VITAMINS) TABS Take 1 tablet by mouth.    . ranitidine (ZANTAC) 150 MG tablet Take 1 tablet (150 mg total) by mouth 2 (two) times daily. 30 tablet 0  . sucralfate (CARAFATE) 1 g tablet Take 1 tablet (1 g total) by mouth 4 (four) times daily. 60 tablet 0  . triamcinolone cream (KENALOG) 0.1 % Apply 1 application topically 2 (two) times daily. 30 g 0   No current facility-administered medications for this visit.     Allergies  Allergen Reactions  . Lac Bovis Other (See Comments)  . Milk-Related Compounds   . Penicillins     Family History  Problem Relation Age of Onset  . Colon cancer Father 2950  . Arthritis  Father   . Hyperlipidemia Father   . Cancer Father     colon/prostate  . Transient ischemic attack Father   . Arthritis Mother   . Heart disease Mother   . Stroke Mother     TIA  . Hypertension Mother   . Kidney disease Mother   . Diabetes Mother   . Cancer Maternal Grandmother   . Cancer Paternal Grandfather     prostate    Social History   Social History  . Marital status: Single    Spouse name: N/A  . Number of children: N/A  . Years of education: N/A   Occupational History  . Not on file.   Social History Main Topics  . Smoking status: Never Smoker  . Smokeless tobacco: Never Used  . Alcohol use No  . Drug use: No  . Sexual activity: No   Other Topics Concern  . Not on file   Social History Narrative   Lives with twin sister   Work- [employer]    No pets    No children    Right handed    No caffeine daily- tea occasionally; eats chocolate    Enjoys shopping, resting, [place].      Constitutional: Denies fever, malaise, fatigue, headache or abrupt weight  changes.  Respiratory: Denies difficulty breathing, shortness of breath, cough or sputum production.   Cardiovascular: Denies chest pain, chest tightness, palpitations or swelling in the hands or feet.  Gastrointestinal: Denies abdominal pain, bloating, constipation, diarrhea or blood in the stool.  GU: Pt reports bilateral flank pain. Denies urgency, frequency, pain with urination, burning sensation, blood in urine, odor or discharge. Musculoskeletal: Pt reports muscle pain. Denies decrease in range of motion, difficulty with gait, or joint pain and swelling.    No other specific complaints in a complete review of systems (except as listed in HPI above).     Objective:   Physical Exam   BP 112/66   Pulse 78   Temp 98.2 F (36.8 C) (Oral)   Wt 152 lb (68.9 kg)   LMP 11/16/2015   SpO2 98%   BMI 21.81 kg/m  Wt Readings from Last 3 Encounters:  12/23/15  152 lb (68.9 kg)  12/05/15 150 lb (68 kg)  11/30/15 149 lb (67.6 kg)    General: Appears her stated age, well developed, well nourished in NAD. Skin: Warm, dry and intact. No rashes, lesions or ulcerations noted. Abdomen: Soft and nontender. Normal bowel sounds. No distention or masses noted. No CVA tenderness noted. Musculoskeletal: Normal flexion, extension and rotation of the spine. No bony tenderness noted. No pain with palpation of the paralumbar muscles.  Neurological: Alert and oriented.    BMET    Component Value Date/Time   NA 140 12/05/2015 1443   K 3.8 12/05/2015 1443   CL 105 12/05/2015 1443   CO2 29 12/05/2015 1443   GLUCOSE 94 12/05/2015 1443   BUN 8 12/05/2015 1443   CREATININE 0.76 12/05/2015 1443   CALCIUM 9.8 12/05/2015 1443   GFRNONAA >60 12/05/2015 1443   GFRAA >60 12/05/2015 1443    Lipid Panel     Component Value Date/Time   CHOL 185 09/30/2015 0910   TRIG 99.0 09/30/2015 0910   HDL 46.60 09/30/2015 0910   CHOLHDL 4 09/30/2015 0910   VLDL 19.8 09/30/2015 0910   LDLCALC 118 (H) 09/30/2015 0910    CBC    Component Value Date/Time   WBC 7.0 12/05/2015 1443   RBC 4.52 12/05/2015 1443   HGB 14.0 12/05/2015 1443   HCT 41.0 12/05/2015 1443   PLT 254 12/05/2015 1443   MCV 90.8 12/05/2015 1443   MCH 31.0 12/05/2015 1443   MCHC 34.1 12/05/2015 1443   RDW 13.1 12/05/2015 1443   LYMPHSABS 2.1 09/28/2014 1031   MONOABS 0.5 09/28/2014 1031   EOSABS 0.1 09/28/2014 1031   BASOSABS 0.0 09/28/2014 1031    Hgb A1C Lab Results  Component Value Date   HGBA1C 5.5 09/30/2015           Assessment & Plan:   Low back pain:  This seems more muscular to me Advised her to start taking Ibuprofen and use a heating pad Stretching exercise given I do not think she needs an xray of the lumbar spine Offered renal ultrasound vs CT abdomen for further evaluation for possible bladder tumor, because this seems to be her major concern, she wants to hold off at  this time and discus with PCP Return precautions given  RTC as needed or if symptoms persist or worsen BAITY, REGINA, NP

## 2015-12-27 ENCOUNTER — Telehealth: Payer: Self-pay

## 2015-12-27 DIAGNOSIS — R109 Unspecified abdominal pain: Secondary | ICD-10-CM

## 2015-12-27 NOTE — Telephone Encounter (Signed)
Pt left v/m; pt was seen 12/23/15; pt continues with back pain and starting on 12/26/15 pelvic pain; if pain continues pt was to cb and get Mayra ReelKate Clark NP advice. Pt request cb.

## 2015-12-27 NOTE — Telephone Encounter (Signed)
Please notify patient that based off of the documentation from her recent visit her symptoms seem to be more muscle related, and the likelihood of a bladder tumor is low. If she is interested in a CT scan of her kidneys I think this would be the next step given her symptoms. Please notify me of her decision.

## 2015-12-27 NOTE — Telephone Encounter (Signed)
Spoken and notified patient of Kate's comments. Patient verbalized understanding.  Patient is agreeable to the CT scan of the kidney and notified someone in our office will give her a call.

## 2015-12-27 NOTE — Telephone Encounter (Signed)
Noted. CT renal order placed.

## 2015-12-30 ENCOUNTER — Ambulatory Visit: Payer: BC Managed Care – PPO | Admitting: Urology

## 2016-01-03 ENCOUNTER — Ambulatory Visit (INDEPENDENT_AMBULATORY_CARE_PROVIDER_SITE_OTHER)
Admission: RE | Admit: 2016-01-03 | Discharge: 2016-01-03 | Disposition: A | Discharge status: Home / Self Care | Payer: BC Managed Care – PPO | Source: Ambulatory Visit | Attending: Primary Care | Admitting: Primary Care

## 2016-01-03 DIAGNOSIS — R109 Unspecified abdominal pain: Secondary | ICD-10-CM | POA: Diagnosis not present

## 2016-01-03 MED ORDER — IOPAMIDOL (ISOVUE-300) INJECTION 61%
100.0000 mL | Freq: Once | INTRAVENOUS | Status: AC | PRN
  Administered 2016-01-03: 100 mL via INTRAVENOUS

## 2016-01-04 ENCOUNTER — Telehealth: Payer: Self-pay | Admitting: Primary Care

## 2016-01-04 NOTE — Telephone Encounter (Signed)
See CT results note. Spoken and notified patient of Kate's comments. Patient verbalized understanding.

## 2016-01-04 NOTE — Telephone Encounter (Signed)
Patient returned Chan's call.  Patient can be reached at work until 2:00.  After 2:00, call her back at home.

## 2016-02-14 ENCOUNTER — Ambulatory Visit: Payer: BC Managed Care – PPO

## 2016-02-16 ENCOUNTER — Ambulatory Visit
Admission: RE | Admit: 2016-02-16 | Discharge: 2016-02-16 | Disposition: A | Discharge status: Home / Self Care | Payer: BC Managed Care – PPO | Source: Ambulatory Visit | Attending: Obstetrics & Gynecology | Admitting: Obstetrics & Gynecology

## 2016-02-16 ENCOUNTER — Other Ambulatory Visit: Payer: Self-pay | Admitting: Obstetrics & Gynecology

## 2016-02-16 DIAGNOSIS — Z1231 Encounter for screening mammogram for malignant neoplasm of breast: Secondary | ICD-10-CM | POA: Diagnosis not present

## 2016-02-21 ENCOUNTER — Other Ambulatory Visit: Payer: Self-pay | Admitting: *Deleted

## 2016-02-21 ENCOUNTER — Ambulatory Visit
Admission: RE | Admit: 2016-02-21 | Discharge: 2016-02-21 | Disposition: A | Discharge status: Home / Self Care | Payer: Self-pay | Source: Ambulatory Visit | Attending: *Deleted | Admitting: *Deleted

## 2016-02-21 DIAGNOSIS — Z9289 Personal history of other medical treatment: Secondary | ICD-10-CM

## 2016-02-28 ENCOUNTER — Ambulatory Visit: Payer: BC Managed Care – PPO | Admitting: Primary Care

## 2016-03-06 ENCOUNTER — Telehealth: Payer: Self-pay

## 2016-03-06 NOTE — Telephone Encounter (Signed)
Pt called stating she is having pelvic and bladder pain. Pt denied feeling like she has a UTI. Pt requested an appt with Carollee HerterShannon. Pt was transferred to the front to see [facility].

## 2016-03-08 ENCOUNTER — Ambulatory Visit (INDEPENDENT_AMBULATORY_CARE_PROVIDER_SITE_OTHER): Payer: BC Managed Care – PPO | Admitting: Family Medicine

## 2016-03-08 ENCOUNTER — Encounter: Payer: Self-pay | Admitting: Family Medicine

## 2016-03-08 VITALS — BP 120/78 | HR 74 | Temp 97.8°F | Wt 148.8 lb

## 2016-03-08 DIAGNOSIS — R102 Pelvic and perineal pain: Secondary | ICD-10-CM | POA: Diagnosis not present

## 2016-03-08 DIAGNOSIS — N898 Other specified noninflammatory disorders of vagina: Secondary | ICD-10-CM

## 2016-03-08 LAB — POC URINALSYSI DIPSTICK (AUTOMATED)
BILIRUBIN UA: NEGATIVE
GLUCOSE UA: NEGATIVE
KETONES UA: NEGATIVE
Leukocytes, UA: NEGATIVE
NITRITE UA: NEGATIVE
Protein, UA: NEGATIVE
Spec Grav, UA: 1.015
Urobilinogen, UA: 0.2
pH, UA: 7

## 2016-03-08 NOTE — Progress Notes (Signed)
Pre visit review using our clinic review tool, if applicable. No additional management support is needed unless otherwise documented below in the visit note. 

## 2016-03-08 NOTE — Patient Instructions (Signed)
Urine showed possible small blood.  I have sent a urine culture.  We have sent wet prep as well.  We will be in touch with results. Keep appointment with urology.  In the interim, ok to take ibuprofen 400mg  twice daily with meals for bladder discomfort. Avoid bladder irritants like spicy foods and caffeine or sodas.

## 2016-03-08 NOTE — Assessment & Plan Note (Addendum)
No significant symptoms of UTI other than mild suprapubic discomfort, but this in setting of recent abx use (4 pills keflex 3 wks ago) for similar sxs. UA - 1+ blood otherwise WNL. UCx sent.   No abx today. Await UCx results. Discussed avoiding bladder irritants and trial NSAID for discomfort.   Given vag discharge - wet prep sent as well.  Keep appt with urology. Pt agrees with plan.

## 2016-03-08 NOTE — Progress Notes (Signed)
BP 120/78 (BP Location: Right Arm, Patient Position: Sitting)   Pulse 74   Temp 97.8 F (36.6 C) (Oral)   Wt 148 lb 12 oz (67.5 kg)   LMP 02/16/2016 (Within Days)   SpO2 97%   BMI 21.34 kg/m    CC: "hurting in my bladder" Subjective:    Patient ID: Cristina Wade, female    DOB: [birth year], 45 y.o.   MRN: [id number]  HPI: Cristina Wade is a 45 y.o. female presenting on 03/08/2016 for Possible UTI   Complicated history over the last 4 months. Chart reviewed. Latest CT with contrast 12/2015 - unrevealing for urinary pathology.   Over last 2 weeks noticing ongoing suprapubic discomfort "bladder pain" and worse smelling urine. For similar symptoms, 3 wks ago she did take 4 pills of left over keflex from 11/2015 infection with improvement.   Currently denies dysuria, urgency, frequency, flank pain, nausea/vomiting, fevers/chills. No new sexual partners. Mild intermittent vaginal discharge, none in the last week.  Last UTI was 11/2015 treated with keflex 500mg  TID x 5 days.   Known chronic microhematuria that is being monitored, none grossly.  She has been evaluated by Dr Marlou PorchHerrick for pelvic floor dysfunction.  No recent bladder interventions or catheterization. Has f/u appt with urology on Monday.  Relevant past medical, surgical, family and social history reviewed and updated as indicated. Interim medical history since our last visit reviewed. Allergies and medications reviewed and updated. Current Outpatient Prescriptions on File Prior to Visit  Medication Sig  . Calcium Carbonate-Vit D-Min (CALCIUM 1200 PO) Take by mouth. Reported on 05/26/2015  . levonorgestrel-ethinyl estradiol (SEASONALE,INTROVALE,JOLESSA) 0.15-0.03 MG tablet Take 1 tablet by mouth daily.  . Multiple Vitamin (MULTI-VITAMINS) TABS Take 1 tablet by mouth.  . Cyanocobalamin (RA VITAMIN B-12 TR) 1000 MCG TBCR Take by mouth.  . ranitidine (ZANTAC) 150 MG tablet Take 1 tablet (150 mg total) by mouth 2 (two) times daily.  (Patient not taking: Reported on 03/08/2016)  . sucralfate (CARAFATE) 1 g tablet Take 1 tablet (1 g total) by mouth 4 (four) times daily. (Patient not taking: Reported on 03/08/2016)  . triamcinolone cream (KENALOG) 0.1 % Apply 1 application topically 2 (two) times daily. (Patient not taking: Reported on 03/08/2016)   No current facility-administered medications on file prior to visit.    Past Medical History:  Diagnosis Date  . Cystitis   . Cystocele   . Endometriosis   . Frequent headaches   . Gross hematuria   . Kidney stones   . Microscopic hematuria   . Migraine   . Migraines   . Osteopenia   . Urinary disorder   . UTI (lower urinary tract infection)     Past Surgical History:  Procedure Laterality Date  . COLONOSCOPY N/A 11/05/2014   Procedure: COLONOSCOPY;  Surgeon: Scot Junobert T Elliott, MD;  [facility];  Service: Endoscopy;  Laterality: N/A;  . CYSTOSCOPY  2007   with biopsy   . DILATATION & CURETTAGE/HYSTEROSCOPY WITH MYOSURE N/A 04/13/2015   Procedure: DILATATION & CURETTAGE/HYSTEROSCOPY WITH MYOSURE/POLYPECTOMY;  Surgeon: Elenora Fenderhelsea C Ward, MD;  Location: [facility] ORS;  Service: Gynecology;  Laterality: N/A;  . GUM SURGERY    . laproscopy  2007    Review of Systems Per HPI unless specifically indicated in ROS section     Objective:    BP 120/78 (BP Location: Right Arm, Patient Position: Sitting)   Pulse 74   Temp 97.8 F (36.6 C) (Oral)   Wt 148 lb 12 oz (67.5  kg)   LMP 02/16/2016 (Within Days)   SpO2 97%   BMI 21.34 kg/m   Wt Readings from Last 3 Encounters:  03/08/16 148 lb 12 oz (67.5 kg)  12/23/15 152 lb (68.9 kg)  12/05/15 150 lb (68 kg)    Physical Exam  Constitutional: She appears well-developed and well-nourished. No distress.  HENT:  Mouth/Throat: Oropharynx is clear and moist. No oropharyngeal exudate.  Cardiovascular: Normal rate, regular rhythm, normal heart sounds and intact distal pulses.   No murmur heard. Pulmonary/Chest: Effort  normal and breath sounds normal. No respiratory distress. She has no wheezes. She has no rales.  Abdominal: Soft. Normal appearance and bowel sounds are normal. She exhibits no distension and no mass. There is no hepatosplenomegaly. There is no tenderness. There is no rigidity, no rebound, no guarding, no CVA tenderness and negative Murphy's sign.  Genitourinary:  Genitourinary Comments: Wet prep self collection sent to lab  Musculoskeletal: She exhibits no edema.  Nursing note and vitals reviewed.  Results for orders placed or performed in visit on 03/08/16  POCT Urinalysis Dipstick (Automated)  Result Value Ref Range   Color, UA Light yellow    Clarity, UA Clear    Glucose, UA Neg    Bilirubin, UA Neg    Ketones, UA Neg    Spec Grav, UA 1.015    Blood, UA 1+    pH, UA 7.0    Protein, UA Neg    Urobilinogen, UA 0.2    Nitrite, UA neg    Leukocytes, UA Negative Negative      Assessment & Plan:  Over 25 minutes were spent face-to-face with the patient during this encounter and >50% of that time was spent on counseling and coordination of care  Problem List Items Addressed This Visit    Suprapubic discomfort - Primary    No significant symptoms of UTI other than mild suprapubic discomfort, but this in setting of recent abx use (4 pills keflex 3 wks ago) for similar sxs. UA - 1+ blood otherwise WNL. UCx sent.   No abx today. Await UCx results. Discussed avoiding bladder irritants and trial NSAID for discomfort.   Given vag discharge - wet prep sent as well.  Keep appt with urology. Pt agrees with plan.      Relevant Orders   Urine culture   POCT Urinalysis Dipstick (Automated) (Completed)    Other Visit Diagnoses    Vaginal discharge       Relevant Orders   WET PREP BY MOLECULAR PROBE   POCT Urinalysis Dipstick (Automated) (Completed)       Follow up plan: Return if symptoms worsen or fail to improve.  [name], MD

## 2016-03-09 LAB — WET PREP BY MOLECULAR PROBE
Candida species: NEGATIVE
GARDNERELLA VAGINALIS: NEGATIVE
Trichomonas vaginosis: NEGATIVE

## 2016-03-10 LAB — URINE CULTURE

## 2016-03-11 ENCOUNTER — Other Ambulatory Visit: Payer: Self-pay | Admitting: Family Medicine

## 2016-03-11 MED ORDER — CEPHALEXIN 500 MG PO CAPS: 500.0000 mg | ORAL_CAPSULE | Freq: Two times a day (BID) | ORAL | 0 refills | Status: DC

## 2016-03-13 ENCOUNTER — Ambulatory Visit: Payer: BC Managed Care – PPO | Admitting: Urology

## 2016-03-21 ENCOUNTER — Encounter: Payer: Self-pay | Admitting: Internal Medicine

## 2016-03-21 ENCOUNTER — Ambulatory Visit (INDEPENDENT_AMBULATORY_CARE_PROVIDER_SITE_OTHER): Payer: BC Managed Care – PPO | Admitting: Internal Medicine

## 2016-03-21 VITALS — BP 110/70 | HR 92 | Temp 98.2°F | Wt 153.0 lb

## 2016-03-21 DIAGNOSIS — J301 Allergic rhinitis due to pollen: Secondary | ICD-10-CM

## 2016-03-21 NOTE — Patient Instructions (Signed)

## 2016-03-21 NOTE — Progress Notes (Signed)
HPI  Pt presents to the clinic today with c/o headache, facial pain and pressure, nasal congestion and cough.  This started 2 days ago. She is blowing clear mucous out of her nose. The cough is non productive. She denies fever or body aches but has had chills. She has tried Sudafed with some relief. She has no history of allergies or breathing problems. She has had sick contacts.  Review of Systems     Past Medical History:  Diagnosis Date  . Cystitis   . Cystocele   . Endometriosis   . Frequent headaches   . Gross hematuria   . Kidney stones   . Microscopic hematuria   . Migraine   . Migraines   . Osteopenia   . Urinary disorder   . UTI (lower urinary tract infection)     Family History  Problem Relation Age of Onset  . Colon cancer Father 2350  . Arthritis Father   . Hyperlipidemia Father   . Cancer Father     colon/prostate  . Transient ischemic attack Father   . Arthritis Mother   . Heart disease Mother   . Stroke Mother     TIA  . Hypertension Mother   . Kidney disease Mother   . Diabetes Mother   . Cancer Maternal Grandmother   . Cancer Paternal Grandfather     prostate    Social History   Social History  . Marital status: Single    Spouse name: N/A  . Number of children: N/A  . Years of education: N/A   Occupational History  . Not on file.   Social History Main Topics  . Smoking status: Never Smoker  . Smokeless tobacco: Never Used  . Alcohol use No  . Drug use: No  . Sexual activity: No   Other Topics Concern  . Not on file   Social History Narrative   Lives with twin sister   Work- [employer]    No pets    No children    Right handed    No caffeine daily- tea occasionally; eats chocolate    Enjoys shopping, resting, [place].     Allergies  Allergen Reactions  . Lac Bovis Other (See Comments)  . Milk-Related Compounds   . Penicillins      Constitutional: Positive headache. Denies fatigue,  fever or abrupt weight changes.  HEENT:  Positive facial pain, nasal congestion. Denies eye redness, ear pain, ringing in the ears, wax buildup, runny nose or sore throat. Respiratory: Positive cough. Denies difficulty breathing or shortness of breath.  Cardiovascular: Denies chest pain, chest tightness, palpitations or swelling in the hands or feet.   No other specific complaints in a complete review of systems (except as listed in HPI above).  Objective:   BP 110/70   Pulse 92   Temp 98.2 F (36.8 C) (Oral)   Wt 153 lb (69.4 kg)   LMP 02/16/2016 (Within Days)   SpO2 98%   BMI 21.95 kg/m   General: Appears her stated age, in NAD. HEENT: Head: normal shape and size, mild maxillary and frontal sinus tenderness noted; Eyes: sclera white, no icterus, conjunctiva pink; Ears: Bilateral cerumen impaction; Nose: mucosa boggy and moist, septum midline; Throat/Mouth: + PND. Teeth present, mucosa pink and moist, no exudate noted, no lesions or ulcerations noted.  Neck:  No adenopathy noted.  Cardiovascular: Normal rate and rhythm. S1,S2 noted.  No murmur, rubs or gallops noted.  Pulmonary/Chest:  Normal effort and positive vesicular breath sounds. No respiratory distress. No wheezes, rales or ronchi noted.       Assessment & Plan:   Allergic Rhinitis  Can use a Neti Pot which can be purchased from your local drug store. Flonase 2 sprays each nostril for 3 days and then as needed. Start Zyrtec OTC daily x 1 week  RTC as needed or if symptoms persist or worsen. Nicki ReaperBAITY, Lucero Ide, NP

## 2016-03-27 ENCOUNTER — Telehealth: Payer: Self-pay

## 2016-03-27 MED ORDER — AZITHROMYCIN 250 MG PO TABS: ORAL_TABLET | ORAL | 0 refills | Status: DC

## 2016-03-27 NOTE — Telephone Encounter (Signed)
Pt left v/m; pt was seen on 03/21/16 with sinus issues and pt thought if not better was to cb on 03/27/16 and abx would be sent to [facility] [address]. Pt request abx.unable to reach pt by phone for more info.

## 2016-03-27 NOTE — Addendum Note (Signed)
Addended by: Lorre MunroeBAITY, Jamilynn Whitacre W on: 03/27/2016 10:22 AM   Modules accepted: Orders

## 2016-03-27 NOTE — Telephone Encounter (Signed)
z pack sent to pharmacy

## 2016-04-03 ENCOUNTER — Telehealth: Payer: Self-pay

## 2016-04-03 NOTE — Telephone Encounter (Signed)
Pt called stating she was seen by her PCP around 11/22 for a UTI. Pt stated that she was given keflex and 2 days after completing abx her urine started to smell again. Pt denied dysuria, n/v, f/c, lower abd pain. Reinforced with pt that foul smelling urine does not mean a UTI. Reinforced with pt to drink more water and see how her s/s are doing. Pt voiced understanding.

## 2016-04-17 DIAGNOSIS — R102 Pelvic and perineal pain: Secondary | ICD-10-CM

## 2016-04-17 DIAGNOSIS — G8929 Other chronic pain: Secondary | ICD-10-CM

## 2016-04-17 HISTORY — DX: Other chronic pain: G89.29

## 2016-04-17 HISTORY — DX: Pelvic and perineal pain: R10.2

## 2016-04-18 ENCOUNTER — Telehealth: Payer: Self-pay | Admitting: Primary Care

## 2016-04-18 NOTE — Telephone Encounter (Signed)
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 This is basically a space filled with small blood vessels within the tissue of the liver. 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 She has no evidence for fatty liver. Her cholesterol was normal in June 2017, we will need to repeat it this summer during her physical.

## 2016-04-18 NOTE — Telephone Encounter (Signed)
Pt is calling for advice on how to treat her fat cells in her liver and also her blood test results. She said she was in over the summer and had some blood work at that time which showed that she had the fat cells in her liver.  Can you please call her and discuss.  Thanks.

## 2016-04-19 NOTE — Telephone Encounter (Signed)
Per DPR, left detail message for patient of Kate's comments and to return call.

## 2016-04-19 NOTE — Telephone Encounter (Signed)
Pt called back she has a few questions about lab work [employer] number [phone]

## 2016-04-20 NOTE — Telephone Encounter (Signed)
Spoken to patient and explain labs as requested.

## 2016-05-23 ENCOUNTER — Encounter
Admission: RE | Admit: 2016-05-23 | Discharge: 2016-05-23 | Disposition: A | Discharge status: Home / Self Care | Payer: BC Managed Care – PPO | Source: Ambulatory Visit | Attending: Obstetrics and Gynecology | Admitting: Obstetrics and Gynecology

## 2016-05-23 HISTORY — DX: Gastro-esophageal reflux disease without esophagitis: K21.9

## 2016-05-23 HISTORY — DX: Personal history of urinary calculi: Z87.442

## 2016-05-23 NOTE — Patient Instructions (Signed)
  Your procedure is scheduled on: 06-01-16 Report to Same Day Surgery 2nd floor medical mall [facility](Medical Mall Entrance-take elevator on left to 2nd floor.  Check in with surgery information desk.) To find out your arrival time please call [phone] between 1PM - 3PM on 05-31-16  Remember: Instructions that are not followed completely may result in serious medical risk, up to and including death, or upon the discretion of your surgeon and anesthesiologist your surgery may need to be rescheduled.    _x___ 1. Do not eat food or drink liquids after midnight. No gum chewing or hard candies.     __x__ 2. No Alcohol for 24 hours before or after surgery.   __x__3. No Smoking for 24 prior to surgery.   ____  4. Bring all medications with you on the day of surgery if instructed.    __x__ 5. Notify your doctor if there is any change in your medical condition     (cold, fever, infections).     Do not wear jewelry, make-up, hairpins, clips or nail polish.  Do not wear lotions, powders, or perfumes. You may wear deodorant.  Do not shave 48 hours prior to surgery. Men may shave face and neck.  Do not bring valuables to the hospital.    [facility] is not responsible for any belongings or valuables.               Contacts, dentures or bridgework may not be worn into surgery.  Leave your suitcase in the car. After surgery it may be brought to your room.  For patients admitted to the hospital, discharge time is determined by your treatment team.   Patients discharged the day of surgery will not be allowed to drive home.  You will need someone to drive you home and stay with you the night of your procedure.    Please read over the following fact sheets that you were given:   [facility] Preparing [facility] and or MRSA Information   _x___ Take these medicines the morning of surgery with A SIP OF WATER:    1. PREVACID  2. TAKE A PREVACID ON Wednesday NIGHT BEFORE BED  3.  4.  5.  6.  ____Fleets  enema or Magnesium Citrate as directed.   ____ Use CHG Soap or sage wipes as directed on instruction sheet   ____ Use inhalers on the day of surgery and bring to hospital day of surgery  ____ Stop metformin 2 days prior to surgery    ____ Take 1/2 of usual insulin dose the night before surgery and none on the morning of  surgery.   ____ Stop Aspirin, Coumadin, Pllavix ,Eliquis, Effient, or Pradaxa  x__ Stop Anti-inflammatories such as Advil, Aleve, Ibuprofen, Motrin, Naproxen,          Naprosyn, Goodies powders or aspirin products NOW-Ok to take Tylenol.   ____ Stop supplements until after surgery.    ____ Bring C-Pap to the hospital.

## 2016-05-23 NOTE — Pre-Procedure Instructions (Signed)
EKG  I, Phineas SemenGraydon Goodman, attending physician, personally viewed and interpreted this EKG  EKG Time: 1435 Rate: 73 Rhythm: normal sinus rhythm Axis: normal Intervals: qtc 440 QRS: narrow, q waves V1, V2 ST changes: no st elevation Impression: abnormal ekg   ____________________________________________

## 2016-06-01 ENCOUNTER — Ambulatory Visit: Payer: BC Managed Care – PPO | Admitting: Anesthesiology

## 2016-06-01 ENCOUNTER — Encounter
Admission: RE | Disposition: A | Discharge status: Home / Self Care | Payer: Self-pay | Source: Ambulatory Visit | Attending: Obstetrics and Gynecology

## 2016-06-01 ENCOUNTER — Ambulatory Visit
Admission: RE | Admit: 2016-06-01 | Discharge: 2016-06-01 | Disposition: A | Discharge status: Home / Self Care | Payer: BC Managed Care – PPO | Source: Ambulatory Visit | Attending: Obstetrics and Gynecology | Admitting: Obstetrics and Gynecology

## 2016-06-01 ENCOUNTER — Encounter: Payer: Self-pay | Admitting: *Deleted

## 2016-06-01 DIAGNOSIS — R300 Dysuria: Secondary | ICD-10-CM | POA: Diagnosis not present

## 2016-06-01 DIAGNOSIS — K219 Gastro-esophageal reflux disease without esophagitis: Secondary | ICD-10-CM | POA: Diagnosis not present

## 2016-06-01 DIAGNOSIS — Z91011 Allergy to milk products: Secondary | ICD-10-CM | POA: Insufficient documentation

## 2016-06-01 DIAGNOSIS — Z880 Allergy status to penicillin: Secondary | ICD-10-CM | POA: Insufficient documentation

## 2016-06-01 DIAGNOSIS — N920 Excessive and frequent menstruation with regular cycle: Secondary | ICD-10-CM | POA: Insufficient documentation

## 2016-06-01 DIAGNOSIS — Z888 Allergy status to other drugs, medicaments and biological substances status: Secondary | ICD-10-CM | POA: Diagnosis not present

## 2016-06-01 DIAGNOSIS — J45909 Unspecified asthma, uncomplicated: Secondary | ICD-10-CM | POA: Diagnosis not present

## 2016-06-01 DIAGNOSIS — R102 Pelvic and perineal pain: Secondary | ICD-10-CM | POA: Diagnosis not present

## 2016-06-01 HISTORY — PX: DILATATION & CURETTAGE/HYSTEROSCOPY WITH MYOSURE: SHX6511

## 2016-06-01 HISTORY — PX: CYSTOSCOPY: SHX5120

## 2016-06-01 LAB — POCT PREGNANCY, URINE: Preg Test, Ur: NEGATIVE

## 2016-06-01 SURGERY — DILATATION & CURETTAGE/HYSTEROSCOPY WITH MYOSURE
Anesthesia: General

## 2016-06-01 MED ORDER — ONDANSETRON HCL 4 MG/2ML IJ SOLN
INTRAMUSCULAR | Status: DC | PRN
  Administered 2016-06-01: 4 mg via INTRAVENOUS

## 2016-06-01 MED ORDER — PROPOFOL 10 MG/ML IV BOLUS
INTRAVENOUS | Status: DC | PRN
  Administered 2016-06-01: 20 mg via INTRAVENOUS
  Administered 2016-06-01: 180 mg via INTRAVENOUS

## 2016-06-01 MED ORDER — DEXAMETHASONE SODIUM PHOSPHATE 10 MG/ML IJ SOLN
INTRAMUSCULAR | Status: DC | PRN
  Administered 2016-06-01: 10 mg via INTRAVENOUS

## 2016-06-01 MED ORDER — ONDANSETRON HCL 4 MG/2ML IJ SOLN
INTRAMUSCULAR | Status: AC
  Filled 2016-06-01: qty 2

## 2016-06-01 MED ORDER — IBUPROFEN 600 MG PO TABS
600.0000 mg | ORAL_TABLET | Freq: Four times a day (QID) | ORAL | 3 refills | Status: DC | PRN
Start: 2016-06-01 — End: 2016-06-21

## 2016-06-01 MED ORDER — ONDANSETRON HCL 4 MG/2ML IJ SOLN
4.0000 mg | Freq: Once | INTRAMUSCULAR | Status: DC | PRN
Start: 2016-06-01 — End: 2016-06-01

## 2016-06-01 MED ORDER — LACTATED RINGERS IV SOLN
INTRAVENOUS | Status: DC
  Administered 2016-06-01: 10:00:00 via INTRAVENOUS

## 2016-06-01 MED ORDER — FAMOTIDINE 20 MG PO TABS
ORAL_TABLET | ORAL | Status: AC
  Filled 2016-06-01: qty 1

## 2016-06-01 MED ORDER — FENTANYL CITRATE (PF) 100 MCG/2ML IJ SOLN
INTRAMUSCULAR | Status: AC
  Filled 2016-06-01: qty 2

## 2016-06-01 MED ORDER — MIDAZOLAM HCL 2 MG/2ML IJ SOLN
INTRAMUSCULAR | Status: DC | PRN
  Administered 2016-06-01: 2 mg via INTRAVENOUS

## 2016-06-01 MED ORDER — FENTANYL CITRATE (PF) 100 MCG/2ML IJ SOLN: 25.0000 ug | INTRAMUSCULAR | Status: DC | PRN

## 2016-06-01 MED ORDER — DEXAMETHASONE SODIUM PHOSPHATE 10 MG/ML IJ SOLN
INTRAMUSCULAR | Status: AC
  Filled 2016-06-01: qty 1

## 2016-06-01 MED ORDER — LIDOCAINE HCL (CARDIAC) 20 MG/ML IV SOLN
INTRAVENOUS | Status: DC | PRN
  Administered 2016-06-01: 80 mg via INTRAVENOUS

## 2016-06-01 MED ORDER — HYDROCODONE-ACETAMINOPHEN 5-325 MG PO TABS: 1.0000 | ORAL_TABLET | Freq: Four times a day (QID) | ORAL | 0 refills | Status: DC | PRN

## 2016-06-01 MED ORDER — FENTANYL CITRATE (PF) 100 MCG/2ML IJ SOLN
INTRAMUSCULAR | Status: DC | PRN
  Administered 2016-06-01: 25 ug via INTRAVENOUS
  Administered 2016-06-01: 50 ug via INTRAVENOUS

## 2016-06-01 MED ORDER — MIDAZOLAM HCL 2 MG/2ML IJ SOLN
INTRAMUSCULAR | Status: AC
  Filled 2016-06-01: qty 2

## 2016-06-01 MED ORDER — NORETHINDRONE ACETATE 5 MG PO TABS: 5.0000 mg | ORAL_TABLET | Freq: Every day | ORAL | 11 refills | Status: DC

## 2016-06-01 SURGICAL SUPPLY — 28 items
ABLATOR ENDOMETRIAL MYOSURE (ABLATOR) ×2 IMPLANT
BAG URO DRAIN 2000ML W/SPOUT (MISCELLANEOUS) ×2 IMPLANT
CANISTER SUC SOCK COL 7IN (MISCELLANEOUS) ×2 IMPLANT
CATH FOLEY 2WAY  5CC 16FR (CATHETERS) ×1
CATH ROBINSON RED A/P 16FR (CATHETERS) ×2 IMPLANT
CATH URTH 16FR FL 2W BLN LF (CATHETERS) ×1 IMPLANT
DEVICE MYOSURE LITE (MISCELLANEOUS) IMPLANT
DRAPE UNDER BUTTOCK W/FLU (DRAPES) ×2 IMPLANT
ELECT REM PT RETURN 9FT ADLT (ELECTROSURGICAL) ×2
ELECTRODE REM PT RTRN 9FT ADLT (ELECTROSURGICAL) ×1 IMPLANT
GLOVE BIO SURGEON STRL SZ7 (GLOVE) ×2 IMPLANT
GLOVE INDICATOR 7.5 STRL GRN (GLOVE) ×2 IMPLANT
GOWN STRL REUS W/ TWL LRG LVL3 (GOWN DISPOSABLE) ×2 IMPLANT
GOWN STRL REUS W/TWL LRG LVL3 (GOWN DISPOSABLE) ×2
KIT RM TURNOVER CYSTO AR (KITS) ×2 IMPLANT
NS IRRIG 1000ML POUR BTL (IV SOLUTION) ×2 IMPLANT
PACK DNC HYST (MISCELLANEOUS) ×2 IMPLANT
PAD OB MATERNITY 4.3X12.25 (PERSONAL CARE ITEMS) ×2 IMPLANT
PAD PREP 24X41 OB/GYN DISP (PERSONAL CARE ITEMS) ×2 IMPLANT
SEAL ROD LENS SCOPE MYOSURE (ABLATOR) ×2 IMPLANT
SET CYSTO W/LG BORE CLAMP LF (SET/KITS/TRAYS/PACK) ×2 IMPLANT
SOL .9 NS 3000ML IRR  AL (IV SOLUTION) ×2
SOL .9 NS 3000ML IRR UROMATIC (IV SOLUTION) ×2 IMPLANT
SPONGE XRAY 4X4 16PLY STRL (MISCELLANEOUS) ×2 IMPLANT
SURGILUBE 2OZ TUBE FLIPTOP (MISCELLANEOUS) ×2 IMPLANT
TOWEL OR 17X26 4PK STRL BLUE (TOWEL DISPOSABLE) ×2 IMPLANT
TUBING CONNECTING 10 (TUBING) ×2 IMPLANT
TUBING HYSTEROSCOPY DOLPHIN (MISCELLANEOUS) ×2 IMPLANT

## 2016-06-01 NOTE — Anesthesia Post-op Follow-up Note (Cosign Needed)
Anesthesia QCDR form completed.        

## 2016-06-01 NOTE — Op Note (Signed)
Patient Name: Cristina CorpusSharon Malter Date of Procedure: @TODAY @  Preoperative Diagnosis: 1) 46 y.o. with abnormal uterine bleeding and pelvic pain 2) History of endometriosis 3) Dysuria frequency in setting negative UA's  Postoperative Diagnosis: 1) 46 y.o. with abnormal uterine bleeding and pelvic pain 2) History of endometriosis 3) Dysuria frequency in setting negative UA's  Operation Performed: Hysteroscopy, dilation and curettage, cystoscopy  Indication: Chronic pelvic pain, abnormal uterine bleeding, and chronic dysuria  Anesthesia: General  Primary Surgeon: Vena AustriaAndreas Glenwood Revoir, MD  Assistant: none  Preoperative Antibiotics: none  Estimated Blood Loss: minimal  IV Fluids: 700mL  Urine Output:: 100mL straight cath  Drains or Tubes: none  Implants: none  Specimens Removed: endometrial curettings  Complications: none  Intraoperative Findings:  Normal cervix, uterus, and tubal ostia.  Thin endometrial lining.  Cystoscopy revealed normal bilateral ureteral orifices. The bladder dome did not distend well and there appeared to be diverticula right and left dome but no stricture or narrowing of the urethra.    Patient Condition: stable  Procedure in Detail:  Patient was taken to the operating room were she was administered general endotracheal anesthesia.  She was positioned in the dorsal lithotomy position utilizing Allen stirups, prepped and draped in the usual sterile fashion.  Uterus was noted to be small in size, anteverted.   Prior to proceeding with the case a time out was performed.  Attention was turned to the patient's pelvis.  A red rubber catheter was used to empty the patient's bladder.  An operative speculum was placed to allow visualization of the cervix.  The anterior lip of the cervix was grasped with a single tooth tenaculum and the cervix was sequentially dilated using pratt dilators.  The hysteroscope was then advanced into the uterine cavity noting the above findings.   Sharp curettage was performed and the resulting specimen collected and sent to pathology.    The single tooth tenaculum was removed from the cervix.  The tenaculum sites and cervix were noted to be  Hemostatic before removing the operative speculum.  Cystoscopy was performed nothing normal urethra, normal ureteral orifices, the bladder dome had a left and right outpuching possible bladder diverticula vs anatomic variation.  No Huhner ulcers visualized.  Sponge needle and instrument counts were corrects times two.  The patient tolerated the procedure well and was taken to the recovery room in stable condition.

## 2016-06-01 NOTE — Anesthesia Preprocedure Evaluation (Signed)
Anesthesia Evaluation  Patient identified by MRN, date of birth, ID band Patient awake    Reviewed: Allergy & Precautions, NPO status , Patient's Chart, lab work & pertinent test results  History of Anesthesia Complications Negative for: history of anesthetic complications  Airway Mallampati: II       Dental   Pulmonary neg pulmonary ROS,           Cardiovascular negative cardio ROS       Neuro/Psych negative neurological ROS     GI/Hepatic Neg liver ROS, GERD  ,  Endo/Other  negative endocrine ROS  Renal/GU negative Renal ROS     Musculoskeletal   Abdominal   Peds  Hematology   Anesthesia Other Findings   Reproductive/Obstetrics                            Anesthesia Physical Anesthesia Plan  ASA: II  Anesthesia Plan: General   Post-op Pain Management:    Induction: Intravenous  Airway Management Planned: LMA  Additional Equipment:   Intra-op Plan:   Post-operative Plan:   Informed Consent: I have reviewed the patients History and Physical, chart, labs and discussed the procedure including the risks, benefits and alternatives for the proposed anesthesia with the patient or authorized representative who has indicated his/her understanding and acceptance.     Plan Discussed with:   Anesthesia Plan Comments:         Anesthesia Quick Evaluation

## 2016-06-01 NOTE — Transfer of Care (Signed)
Immediate Anesthesia Transfer of Care Note  Patient: Cristina CorpusSharon Richburg  Procedure(s) Performed: Procedure(s): DILATATION & CURETTAGE/HYSTEROSCOPY WITH MYOSURE (N/A) CYSTOSCOPY (N/A)  Patient Location: PACU  Anesthesia Type:General  Level of Consciousness: sedated  Airway & Oxygen Therapy: Patient Spontanous Breathing and Patient connected to face mask oxygen  Post-op Assessment: Report given to RN and Post -op Vital signs reviewed and stable  Post vital signs: Reviewed and stable  Last Vitals:  Vitals:   06/01/16 1006 06/01/16 1250  BP: 137/79 122/74  Pulse: 61 67  Resp: 16 11  Temp: 36.3 C (!) 36.1 C    Last Pain:  Vitals:   06/01/16 1250  TempSrc: Tympanic  PainSc:          Complications: No apparent anesthesia complications

## 2016-06-01 NOTE — Anesthesia Procedure Notes (Signed)
Procedure Name: LMA Insertion Date/Time: 06/01/2016 12:05 PM Performed by: Karoline CaldwellSTARR, Perry Brucato Pre-anesthesia Checklist: Patient identified, Emergency Drugs available, Suction available and Patient being monitored Patient Re-evaluated:Patient Re-evaluated prior to inductionOxygen Delivery Method: Circle system utilized Preoxygenation: Pre-oxygenation with 100% oxygen Intubation Type: IV induction Ventilation: Mask ventilation without difficulty LMA: LMA inserted LMA Size: 3.5 Number of attempts: 1 Placement Confirmation: positive ETCO2 and breath sounds checked- equal and bilateral Tube secured with: Tape Dental Injury: Teeth and Oropharynx as per pre-operative assessment

## 2016-06-01 NOTE — H&P (Signed)
Date of Initial paper H&P: 05/23/16  History reviewed, patient examined, no change in status, stable for surgery.

## 2016-06-01 NOTE — Anesthesia Postprocedure Evaluation (Signed)
Anesthesia Post Note  Patient: Cristina Wade  Procedure(s) Performed: Procedure(s) (LRB): DILATATION & CURETTAGE/HYSTEROSCOPY WITH MYOSURE (N/A) CYSTOSCOPY (N/A)  Patient location during evaluation: PACU Anesthesia Type: General Level of consciousness: awake and alert Pain management: pain level controlled Vital Signs Assessment: post-procedure vital signs reviewed and stable Respiratory status: spontaneous breathing and respiratory function stable Cardiovascular status: stable Anesthetic complications: no     Last Vitals:  Vitals:   06/01/16 1006 06/01/16 1250  BP: 137/79 122/74  Pulse: 61 67  Resp: 16 11  Temp: 36.3 C (!) 36.1 C    Last Pain:  Vitals:   06/01/16 1250  TempSrc: Tympanic  PainSc:                  Aislinn Feliz K

## 2016-06-02 ENCOUNTER — Encounter: Payer: Self-pay | Admitting: Obstetrics and Gynecology

## 2016-06-02 LAB — SURGICAL PATHOLOGY

## 2016-06-09 ENCOUNTER — Telehealth: Payer: Self-pay | Admitting: Urology

## 2016-06-09 NOTE — Telephone Encounter (Signed)
No need for double book.  She can see me or Dr. Sherron MondayMacDiarmid on a nonurgent basis.    Vanna ScotlandAshley Trell Secrist, MD

## 2016-06-09 NOTE — Telephone Encounter (Signed)
Pt called and stated Dr. Apolinar JunesBrandon had spoke w/Dr Chauncey CruelStabler about her.  The first available appt was 3/28 @ 1:45.  Please advise if she will need an earlier appt to be double booked.

## 2016-06-12 NOTE — Telephone Encounter (Signed)
Left message on VM that she was to keep her appointment with dr. Apolinar JunesBrandon and that if she had any more questions to call the office back.  Marcelino DusterMichelle

## 2016-06-19 ENCOUNTER — Other Ambulatory Visit: Payer: Self-pay | Admitting: Obstetrics and Gynecology

## 2016-06-19 DIAGNOSIS — R300 Dysuria: Secondary | ICD-10-CM

## 2016-06-21 ENCOUNTER — Encounter: Payer: Self-pay | Admitting: Urology

## 2016-06-21 ENCOUNTER — Ambulatory Visit: Payer: BC Managed Care – PPO | Admitting: Urology

## 2016-06-21 VITALS — BP 134/75 | HR 79 | Ht 70.0 in | Wt 148.0 lb

## 2016-06-21 DIAGNOSIS — M6289 Other specified disorders of muscle: Secondary | ICD-10-CM

## 2016-06-21 DIAGNOSIS — R3989 Other symptoms and signs involving the genitourinary system: Secondary | ICD-10-CM | POA: Diagnosis not present

## 2016-06-21 DIAGNOSIS — K5909 Other constipation: Secondary | ICD-10-CM

## 2016-06-21 NOTE — Progress Notes (Signed)
06/21/2016 3:34 PM   Cristina CorpusSharon Wade Oct 12, 1970 [id number]  Referring provider: Doreene NestKatherine K Clark, NP [address] [place], [place] [id number]  Chief Complaint  Patient presents with  . Hematuria    follow up , flank pain    HPI: 46 year old female with history of dysfunctional voiding, chronic constipation who presents today for further evaluation of lower abdominal/bladder pain.  She has had extensive workup in the past including urodynamics which revealed delayed first sensation at 626cc, normal bladder capacity and pressures. Mild demonstrable stress urinary incontinence was appreciated. No evidence of urgency. EMG was consistent with poor relaxation of the external sphincter consistent with pseudo-DSD associated with incomplete bladder emptying.  Shortly thereafter, she was referred to physical therapy and had 1 session. She was taught some techniques including raising her feet above the level of her bladder when voiding and some core exercises.  She tried this for approximate 2 weeks and it did not help so she stopped. She is not been back to physical therapy.  Most recently, she underwent cystoscopy, excision of cervical polyp by Dr. Chauncey CruelStabler. 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 She brings pictures of this today.    She does report that back in February, she had an episode of lower abdominal pain associated with her bladder. She states that it was dull and painful and relieved with voiding. It lasted 3 or 4 days and spontaneously resolve. It has not recurred. She was prescribed Elmeron by her PCP but has not started this medication.  She does have severe chronic constipation. She only has a bowel movement proximal leg twice a week. She does occasionally take Benefiber  otherwise no maintenance bowel medications or regimen.   PMH: Past Medical History:  Diagnosis Date  . Cystitis   . Cystocele     . Endometriosis   . Frequent headaches   . GERD (gastroesophageal reflux disease)    rare  . Gross hematuria   . History of kidney stones   . Microscopic hematuria   . Migraine   . Migraines   . Osteopenia   . Urinary disorder   . UTI (lower urinary tract infection)     Surgical History: Past Surgical History:  Procedure Laterality Date  . COLONOSCOPY N/A 11/05/2014   Procedure: COLONOSCOPY;  Surgeon: Scot Junobert T Elliott, MD;  [facility];  Service: Endoscopy;  Laterality: N/A;  . CYSTOSCOPY  2007   with biopsy   . CYSTOSCOPY N/A 06/01/2016   Procedure: CYSTOSCOPY;  Surgeon: Vena AustriaAndreas Staebler, MD;  Location: [facility] ORS;  Service: Gynecology;  Laterality: N/A;  . DIAGNOSTIC LAPAROSCOPY    . DILATATION & CURETTAGE/HYSTEROSCOPY WITH MYOSURE N/A 04/13/2015   Procedure: DILATATION & CURETTAGE/HYSTEROSCOPY WITH MYOSURE/POLYPECTOMY;  Surgeon: Elenora Fenderhelsea C Ward, MD;  Location: [facility] ORS;  Service: Gynecology;  Laterality: N/A;  . DILATATION & CURETTAGE/HYSTEROSCOPY WITH MYOSURE N/A 06/01/2016   Procedure: DILATATION & CURETTAGE/HYSTEROSCOPY WITH MYOSURE;  Surgeon: Vena AustriaAndreas Staebler, MD;  Location: [facility] OR[facility];  Service: Gynecology;  Laterality: N/A;  . GUM SURGERY    . laproscopy  2007    Home Medications:  Allergies as of 06/21/2016      Reactions   Lac Bovis Other (See Comments)   Milk-related Compounds    Penicillins Itching, Rash      Medication List       Accurate as of 06/21/16 11:59 PM. Always use your most recent med list.  CALCIUM 1200 PO Take 2-3 tablets by mouth daily. Reported on 05/26/2015   ELMIRON 100 MG capsule Generic drug:  pentosan polysulfate   levonorgestrel-ethinyl estradiol 0.15-0.03 MG tablet Commonly known as:  SEASONALE,INTROVALE,JOLESSA Take 1 tablet by mouth daily.       Allergies:  Allergies  Allergen Reactions  . Lac Bovis Other (See Comments)  . Milk-Related Compounds   . Penicillins Itching and Rash    Family  History: Family History  Problem Relation Age of Onset  . Colon cancer Father 15  . Arthritis Father   . Hyperlipidemia Father   . Cancer Father     colon/prostate  . Transient ischemic attack Father   . Arthritis Mother   . Heart disease Mother   . Stroke Mother     TIA  . Hypertension Mother   . Kidney disease Mother   . Diabetes Mother   . Cancer Maternal Grandmother   . Cancer Paternal Grandfather     prostate    Social History:  reports that she has never smoked. She has never used smokeless tobacco. She reports that she does not drink alcohol or use drugs.  ROS: UROLOGY Frequent Urination?: No Hard to postpone urination?: No Burning/pain with urination?: No Get up at night to urinate?: No Leakage of urine?: No Urine stream starts and stops?: Yes Trouble starting stream?: Yes Do you have to strain to urinate?: Yes Blood in urine?: No Urinary tract infection?: No Sexually transmitted disease?: No Injury to kidneys or bladder?: No Painful intercourse?: No Weak stream?: Yes Currently pregnant?: No Vaginal bleeding?: No Last menstrual period?: 05/21/16  Gastrointestinal Nausea?: No Vomiting?: No Indigestion/heartburn?: No Diarrhea?: No Constipation?: Yes  Constitutional Fever: No Night sweats?: No Weight loss?: No Fatigue?: No  Skin Skin rash/lesions?: No Itching?: No  Eyes Blurred vision?: No Double vision?: No  Ears/Nose/Throat Sore throat?: No Sinus problems?: No  Hematologic/Lymphatic Swollen glands?: No Easy bruising?: No  Cardiovascular Leg swelling?: No Chest pain?: No  Respiratory Cough?: No Shortness of breath?: No  Endocrine Excessive thirst?: No  Musculoskeletal Back pain?: No Joint pain?: No  Neurological Headaches?: No Dizziness?: Yes  Psychologic Depression?: No Anxiety?: No  Physical Exam: BP 134/75   Pulse 79   Ht 5\' 10"  (1.778 m)   Wt 148 lb (67.1 kg)   LMP 05/23/2016 (Exact Date)   BMI 21.24 kg/m    Constitutional:  Alert and oriented, No acute distress.  Accompanied by sister. HEENT:  AT, moist mucus membranes.  Trachea midline, no masses. Cardiovascular: No clubbing, cyanosis, or edema. Respiratory: Normal respiratory effort, no increased work of breathing. GI: Abdomen is soft, nontender, nondistended, no abdominal masses GU: No CVA tenderness. Skin: No rashes, bruises or suspicious lesions. Lymph: No cervical or inguinal adenopathy. Neurologic: Grossly intact, no focal deficits, moving all 4 extremities. Psychiatric: Normal mood and affect.  Laboratory Data: Lab Results  Component Value Date   WBC 7.0 12/05/2015   HGB 14.0 12/05/2015   HCT 41.0 12/05/2015   MCV 90.8 12/05/2015   PLT 254 12/05/2015    Lab Results  Component Value Date   CREATININE 0.76 12/05/2015    Lab Results  Component Value Date   HGBA1C 5.5 09/30/2015    Urinalysis Results for orders placed or performed in visit on 06/21/16  Microscopic Examination  Result Value Ref Range   WBC, UA 0-5 0 - 5 /hpf   RBC, UA 0-2 0 - 2 /hpf   Epithelial Cells (non renal) 0-10 0 - 10 /  hpf   Crystals Present (A) N/A   Crystal Type Amorphous Sediment N/A   Bacteria, UA None seen None seen/Few  Urinalysis, Complete  Result Value Ref Range   Specific Gravity, UA 1.020 1.005 - 1.030   pH, UA 7.5 5.0 - 7.5   Color, UA Yellow Yellow   Appearance Ur Hazy (A) Clear   Leukocytes, UA Negative Negative   Protein, UA Trace (A) Negative/Trace   Glucose, UA Negative Negative   Ketones, UA Negative Negative   RBC, UA Trace (A) Negative   Bilirubin, UA Negative Negative   Urobilinogen, Ur 0.2 0.2 - 1.0 mg/dL   Nitrite, UA Negative Negative   Microscopic Examination See below:      Pertinent Imaging: Imaging from cystoscopy reviewed  Assessment & Plan:    1. 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 Treat chronic constipation as below  2. Bladder pain Occasional episodes of pain in lower abdomen associated with bladder Possible diagnosis of IC versus exacerbation of pseudo-DSD We discussed the diease pathophysiology is poorly understood therefore treatment has been focused primarily on symptomatic relief as well as dietary and behavioral modification. Information pamphlets were reviewed and given today discussing the current understanding of the syndrome as well as treatment options. IC dietary information also given today as many patients experience relief with simple lifestyle modifications.  Given that she is currently asymptomatic, would hold off on Elmeron on treatment - Urinalysis, Complete  3. Chronic constipation Commend daily bowel regimen of Colace twice a day Recommend MiraLAX if no bowel movement at least every other day Recommend adequate hydration Explained association between bowel and bladder symptoms   Return if symptoms worsen or fail to improve.  Vanna Scotland, MD  [facility] [address] [place], [place] [address] [phone]  I spent 25 min with this patient of which greater than 50% was spent in counseling and coordination of care with the patient.

## 2016-06-22 LAB — URINALYSIS, COMPLETE
Bilirubin, UA: NEGATIVE
GLUCOSE, UA: NEGATIVE
Ketones, UA: NEGATIVE
LEUKOCYTES UA: NEGATIVE
NITRITE UA: NEGATIVE
SPEC GRAV UA: 1.02 (ref 1.005–1.030)
Urobilinogen, Ur: 0.2 mg/dL (ref 0.2–1.0)
pH, UA: 7.5 (ref 5.0–7.5)

## 2016-06-22 LAB — MICROSCOPIC EXAMINATION: BACTERIA UA: NONE SEEN

## 2016-06-26 ENCOUNTER — Ambulatory Visit: Payer: BC Managed Care – PPO | Admitting: Family Medicine

## 2016-06-26 ENCOUNTER — Telehealth: Payer: Self-pay | Admitting: Primary Care

## 2016-06-26 ENCOUNTER — Ambulatory Visit (INDEPENDENT_AMBULATORY_CARE_PROVIDER_SITE_OTHER): Payer: BC Managed Care – PPO | Admitting: Family Medicine

## 2016-06-26 ENCOUNTER — Encounter: Payer: Self-pay | Admitting: Family Medicine

## 2016-06-26 DIAGNOSIS — R0789 Other chest pain: Secondary | ICD-10-CM | POA: Diagnosis not present

## 2016-06-26 MED ORDER — PREDNISONE 50 MG PO TABS: ORAL_TABLET | ORAL | 0 refills | Status: DC

## 2016-06-26 NOTE — Progress Notes (Signed)
Pre visit review using our clinic review tool, if applicable. No additional management support is needed unless otherwise documented below in the visit note. 

## 2016-06-26 NOTE — Telephone Encounter (Signed)
Okay with me, thanks 

## 2016-06-26 NOTE — Telephone Encounter (Signed)
Pt states that she would like to switch back to [facility] due to the location.. Please advise

## 2016-06-26 NOTE — Patient Instructions (Signed)
Medication as prescribed.   Let us know if you don't improve  Take care  Dr. Adriana Simasook

## 2016-06-27 DIAGNOSIS — R0789 Other chest pain: Secondary | ICD-10-CM | POA: Insufficient documentation

## 2016-06-27 NOTE — Progress Notes (Signed)
   Subjective:  Patient ID: Cristina CorpusSharon Duffin, female    DOB: 1970/08/12  Age: 46 y.o. MRN: [id number]  CC: Chest tightness, cough  HPI:  46 year old female presents with the above complaints.  Patient reports a two-week history of chest tightness. Worse with cough. No associated shortness of breath. No fever. She states that it seems to be improving. No known relieving factors. No other associated symptoms. No other complaints or concerns at this time.  Social Hx   Social History   Social History  . Marital status: Single    Spouse name: N/A  . Number of children: N/A  . Years of education: N/A   Social History Main Topics  . Smoking status: Never Smoker  . Smokeless tobacco: Never Used  . Alcohol use No  . Drug use: No  . Sexual activity: No   Other Topics Concern  . None   Social History Narrative   Lives with twin sister   Work- [employer]    No pets    No children    Right handed    No caffeine daily- tea occasionally; eats chocolate    Enjoys shopping, resting, [place].     Review of Systems  Constitutional: Negative for fever.  Respiratory: Positive for cough and chest tightness.    Objective:  BP 124/72   Pulse 74   Temp 97.6 F (36.4 C) (Oral)   Wt 151 lb 8 oz (68.7 kg)   SpO2 98%   BMI 21.74 kg/m   BP/Weight 06/26/2016 06/21/2016 06/01/2016  Systolic BP 124 134 137  Diastolic BP 72 75 74  Wt. (Lbs) 151.5 148 153  BMI 21.74 21.24 21.95   Physical Exam  Constitutional: She is oriented to person, place, and time. She appears well-developed. No distress.  HENT:  Mouth/Throat: Oropharynx is clear and moist.  Neck: Neck supple.  Cardiovascular: Normal rate and regular rhythm.   Pulmonary/Chest: Effort normal and breath sounds normal. She has no wheezes. She has no rales.  Neurological: She is alert and oriented to person, place, and time.  Psychiatric: She has a normal mood and affect.  Vitals reviewed.   Lab  Results  Component Value Date   WBC 7.0 12/05/2015   HGB 14.0 12/05/2015   HCT 41.0 12/05/2015   PLT 254 12/05/2015   GLUCOSE 94 12/05/2015   CHOL 185 09/30/2015   TRIG 99.0 09/30/2015   HDL 46.60 09/30/2015   LDLCALC 118 (H) 09/30/2015   ALT 11 09/30/2015   AST 12 09/30/2015   NA 140 12/05/2015   K 3.8 12/05/2015   CL 105 12/05/2015   CREATININE 0.76 12/05/2015   BUN 8 12/05/2015   CO2 29 12/05/2015   HGBA1C 5.5 09/30/2015    Assessment & Plan:   Problem List Items Addressed This Visit    Chest tightness    New acute problem. History not suggestive of cardiac etiology and patient at low risk. Suspect bronchitis. Treating with prednisone.         Meds ordered this encounter  Medications  . predniSONE (DELTASONE) 50 MG tablet    Sig: 1 tablet daily x 5 days.    Dispense:  5 tablet    Refill:  0    Follow-up: PRN  Everlene OtherJayce Willian Donson DO [facility]

## 2016-06-27 NOTE — Assessment & Plan Note (Signed)
New acute problem. History not suggestive of cardiac etiology and patient at low risk. Suspect bronchitis. Treating with prednisone.

## 2016-07-07 DIAGNOSIS — R072 Precordial pain: Secondary | ICD-10-CM | POA: Insufficient documentation

## 2016-07-07 DIAGNOSIS — I4510 Unspecified right bundle-branch block: Secondary | ICD-10-CM | POA: Insufficient documentation

## 2016-07-12 ENCOUNTER — Ambulatory Visit: Payer: BC Managed Care – PPO | Admitting: Urology

## 2016-07-21 ENCOUNTER — Ambulatory Visit: Payer: Self-pay | Admitting: Obstetrics and Gynecology

## 2016-09-19 ENCOUNTER — Telehealth: Payer: Self-pay

## 2016-09-19 NOTE — Telephone Encounter (Signed)
Pt calling to report she has been hurting in her pelvic area, having d/c every now & then & her ovaries have been hurting. [phone]#[phone].

## 2016-09-19 NOTE — Telephone Encounter (Signed)
LMTRC

## 2016-09-20 ENCOUNTER — Ambulatory Visit: Payer: Self-pay | Admitting: Advanced Practice Midwife

## 2016-09-20 NOTE — Telephone Encounter (Signed)
Pt transferred from front desk. She states she has endometriosis, but this pain she is having is different. It started about 2 wks ago. She had her cycle last week & she is still having pain. She has not taken any OTC meds to relieve the pain. Recommended trying Ibuprofen or Aleve. Pt requesting to be seen. Apt scheduled w/JEG today @1 :30.

## 2016-09-20 NOTE — Telephone Encounter (Signed)
Pt is not seeing JEG. Seeing AMS 09/21/2016. fwding to kim.

## 2016-09-21 ENCOUNTER — Ambulatory Visit (INDEPENDENT_AMBULATORY_CARE_PROVIDER_SITE_OTHER): Payer: BC Managed Care – PPO | Admitting: Obstetrics and Gynecology

## 2016-09-21 ENCOUNTER — Encounter: Payer: Self-pay | Admitting: Obstetrics and Gynecology

## 2016-09-21 VITALS — BP 118/76 | HR 86 | Wt 146.0 lb

## 2016-09-21 DIAGNOSIS — G8929 Other chronic pain: Secondary | ICD-10-CM | POA: Diagnosis not present

## 2016-09-21 DIAGNOSIS — R102 Pelvic and perineal pain: Secondary | ICD-10-CM | POA: Diagnosis not present

## 2016-09-21 MED ORDER — GABAPENTIN 100 MG PO CAPS: ORAL_CAPSULE | ORAL | 0 refills | Status: DC

## 2016-09-21 NOTE — Progress Notes (Signed)
Obstetrics & Gynecology Office Visit   Chief Complaint:  Chief Complaint  Patient presents with  . Gynecologic Exam    sporadic Pelvic pain x 1 week...constant now    History of Present Illness: 46 yo G0 presenting for follow up of chronic pelvic pain.  She is s/p hysteroscopy and d&c 06/01/2016 with normal findings at that time, in particular no evidence of endometriosis.  She was trialed on norethindrone for a history of endometriosis but failed to improve on this.  Did have prominent urinary symptoms and was seen by urology, has since also been evaluated by GI (for evaluation of constipation as contributing factor to her urinary symptoms).  Still reports intermittent sharp suprapubic pain and RLQ pain, worse with ambulation.  "Feels like my ovary is hurting again".  We discussed normal imaging previously.    Review of Systems: Review of systems negative unless noted in HPI  Past Medical History:  Past Medical History:  Diagnosis Date  . Cystitis   . Cystocele   . Endometriosis   . Frequent headaches   . GERD (gastroesophageal reflux disease)    rare  . Gross hematuria   . History of kidney stones   . Microscopic hematuria   . Migraine   . Migraines   . Osteopenia   . Urinary disorder   . UTI (lower urinary tract infection)     Past Surgical History:  Past Surgical History:  Procedure Laterality Date  . COLONOSCOPY N/A 11/05/2014   Procedure: COLONOSCOPY;  Surgeon: Scot Junobert T Elliott, MD;  [facility];  Service: Endoscopy;  Laterality: N/A;  . CYSTOSCOPY  2007   with biopsy   . CYSTOSCOPY N/A 06/01/2016   Procedure: CYSTOSCOPY;  Surgeon: Vena AustriaAndreas Arman Loy, MD;  Location: [facility] ORS;  Service: Gynecology;  Laterality: N/A;  . DIAGNOSTIC LAPAROSCOPY    . DILATATION & CURETTAGE/HYSTEROSCOPY WITH MYOSURE N/A 04/13/2015   Procedure: DILATATION & CURETTAGE/HYSTEROSCOPY WITH MYOSURE/POLYPECTOMY;  Surgeon: Elenora Fenderhelsea C Ward, MD;  Location: [facility] ORS;  Service:  Gynecology;  Laterality: N/A;  . DILATATION & CURETTAGE/HYSTEROSCOPY WITH MYOSURE N/A 06/01/2016   Procedure: DILATATION & CURETTAGE/HYSTEROSCOPY WITH MYOSURE;  Surgeon: Vena AustriaAndreas Eddi Hymes, MD;  Location: [facility] ORS;  Service: Gynecology;  Laterality: N/A;  . GUM SURGERY    . laproscopy  2007    Gynecologic History: Patient's last menstrual period was 09/15/2016.  Obstetric History: No obstetric history on file.  Family History:  Family History  Problem Relation Age of Onset  . Colon cancer Father 2550  . Arthritis Father   . Hyperlipidemia Father   . Cancer Father        colon/prostate  . Transient ischemic attack Father   . Arthritis Mother   . Heart disease Mother   . Stroke Mother        TIA  . Hypertension Mother   . Kidney disease Mother   . Diabetes Mother   . Cancer Maternal Grandmother   . Cancer Paternal Grandfather        prostate    Social History:  Social History   Social History  . Marital status: Single    Spouse name: N/A  . Number of children: N/A  . Years of education: N/A   Occupational History  . Not on file.   Social History Main Topics  . Smoking status: Never Smoker  . Smokeless tobacco: Never Used  . Alcohol use No  . Drug use: No  . Sexual activity: No   Other Topics Concern  .  Not on file   Social History Narrative   Lives with twin sister   Work- Film/video editor [employer]    No pets    No children    Right handed    No caffeine daily- tea occasionally; eats chocolate    Enjoys shopping, resting, spending time at the lake.     Allergies:  Allergies  Allergen Reactions  . Lac Bovis Other (See Comments)  . Milk-Related Compounds   . Penicillins Itching and Rash    Medications: Prior to Admission medications   Medication Sig Start Date End Date Taking? Authorizing Provider  Calcium Carbonate-Vit D-Min (CALCIUM 1200 PO) Take 2-3 tablets by mouth daily. Reported on 05/26/2015   Yes [provider]    levonorgestrel-ethinyl estradiol (SEASONALE,INTROVALE,JOLESSA) 0.15-0.03 MG tablet Take 1 tablet by mouth daily.   Yes [provider]  gabapentin (NEURONTIN) 100 MG capsule 1 tablet po once daily on day 1, 1 tablet po bid day 2, then 1 tablet po tid 09/21/16   Vena Austria, MD    Physical Exam Vitals:  Vitals:   09/21/16 1550  BP: 118/76  Pulse: 86   Patient's last menstrual period was 09/15/2016.  General: NAD HEENT: normocephalic, anicteric Pulmonary: No increased work of breathing Abdomen: NABS, soft, non-tender, non-distended.  Umbilicus without lesions.  No hepatomegaly, splenomegaly or masses palpable. No evidence of hernia  Genitourinary:  External: Normal external female genitalia.  Normal urethral meatus, normal Bartholin's and Skene's glands.    Vagina: Normal vaginal mucosa, no evidence of prolapse.    Cervix: Grossly normal in appearance, no bleeding  Uterus: Non-enlarged, mobile, normal contour.  No CMT  Adnexa: ovaries non-enlarged, no adnexal masses  Rectal: deferred  Lymphatic: no evidence of inguinal lymphadenopathy Extremities: no edema, erythema, or tenderness Neurologic: Grossly intact Psychiatric: mood appropriate, affect full  Female chaperone present for pelvic and breast  portions of the physical exam  Assessment: 46 y.o. with chronic pelvic pain  Plan: Problem List Items Addressed This Visit    None    Visit Diagnoses    Chronic pelvic pain in female    -  Primary   Relevant Medications   gabapentin (NEURONTIN) 100 MG capsule     - I'm not convinced that the patients symptoms are secondary to endometriosis given lack of improvement on norethindrone.  Will trial on gabapentin in addition norethindrone. - She is seeing pelvic physical floor therapy and I think evaluating for some pelvic floor dysfunction that may be contributing to any pain may be reasonable.   - trial of gabapentin - referral to [facility] pelvic pain clinic - A total of  15 minutes were spent in face-to-face contact with the patient during this encounter with over half of that time devoted to counseling and coordination of care.

## 2016-09-22 ENCOUNTER — Other Ambulatory Visit: Payer: Self-pay | Admitting: Student

## 2016-09-22 DIAGNOSIS — K5900 Constipation, unspecified: Secondary | ICD-10-CM

## 2016-09-22 DIAGNOSIS — R110 Nausea: Secondary | ICD-10-CM

## 2016-09-22 DIAGNOSIS — R1084 Generalized abdominal pain: Secondary | ICD-10-CM

## 2016-09-25 ENCOUNTER — Ambulatory Visit
Admission: RE | Admit: 2016-09-25 | Discharge: 2016-09-25 | Disposition: A | Discharge status: Home / Self Care | Payer: BC Managed Care – PPO | Source: Ambulatory Visit | Attending: Student | Admitting: Student

## 2016-09-25 DIAGNOSIS — K7689 Other specified diseases of liver: Secondary | ICD-10-CM | POA: Diagnosis not present

## 2016-09-25 DIAGNOSIS — K5900 Constipation, unspecified: Secondary | ICD-10-CM | POA: Insufficient documentation

## 2016-09-25 DIAGNOSIS — R1084 Generalized abdominal pain: Secondary | ICD-10-CM | POA: Diagnosis not present

## 2016-09-25 DIAGNOSIS — R110 Nausea: Secondary | ICD-10-CM | POA: Insufficient documentation

## 2016-09-25 MED ORDER — IOPAMIDOL (ISOVUE-300) INJECTION 61%
100.0000 mL | Freq: Once | INTRAVENOUS | Status: AC | PRN
  Administered 2016-09-25: 100 mL via INTRAVENOUS

## 2016-09-25 MED ORDER — NORETHINDRONE ACETATE 5 MG PO TABS: 5.0000 mg | ORAL_TABLET | Freq: Every day | ORAL | 11 refills | Status: DC

## 2016-10-13 NOTE — Telephone Encounter (Signed)
Please advise if you will take her as a pt

## 2016-11-07 ENCOUNTER — Other Ambulatory Visit (HOSPITAL_COMMUNITY)
Admission: RE | Admit: 2016-11-07 | Discharge: 2016-11-07 | Disposition: A | Discharge status: Home / Self Care | Payer: BC Managed Care – PPO | Source: Ambulatory Visit | Attending: Family | Admitting: Family

## 2016-11-07 ENCOUNTER — Ambulatory Visit (INDEPENDENT_AMBULATORY_CARE_PROVIDER_SITE_OTHER): Payer: BC Managed Care – PPO | Admitting: Family

## 2016-11-07 ENCOUNTER — Encounter: Payer: Self-pay | Admitting: Family

## 2016-11-07 VITALS — BP 120/74 | HR 70 | Temp 97.7°F | Ht 70.0 in | Wt 148.2 lb

## 2016-11-07 DIAGNOSIS — R300 Dysuria: Secondary | ICD-10-CM

## 2016-11-07 LAB — POCT URINALYSIS DIPSTICK
Bilirubin, UA: NEGATIVE
Glucose, UA: NEGATIVE
NITRITE UA: NEGATIVE
PROTEIN UA: NEGATIVE
Spec Grav, UA: 1.015 (ref 1.010–1.025)
Urobilinogen, UA: 0.2 E.U./dL
pH, UA: 7 (ref 5.0–8.0)

## 2016-11-07 LAB — URINALYSIS, MICROSCOPIC ONLY: RBC / HPF: NONE SEEN (ref 0–?)

## 2016-11-07 MED ORDER — NITROFURANTOIN MONOHYD MACRO 100 MG PO CAPS: 100.0000 mg | ORAL_CAPSULE | Freq: Two times a day (BID) | ORAL | 0 refills | Status: DC

## 2016-11-07 NOTE — Progress Notes (Signed)
Pre visit review using our clinic review tool, if applicable. No additional management support is needed unless otherwise documented below in the visit note. 

## 2016-11-07 NOTE — Progress Notes (Signed)
Subjective:    Patient ID: Cristina Wade, female    DOB: 1970/10/03, 46 y.o.   MRN: [id number]  CC: Cristina Wade is a 46 y.o. female who presents today for an acute visit.    HPI: Chief complaint of dysuria.endorses low back pain, suprapubic pressure. Increased vaginal discharge. No vaginal itching. Occasional foul smell.   No fever, flank pain, N, V.  No concern for STDs or recent UTIs.    HISTORY:  Past Medical History:  Diagnosis Date  . Asthma   . Cystitis   . Cystocele   . Endometriosis   . Frequent headaches   . GERD (gastroesophageal reflux disease)    rare  . Gross hematuria   . History of kidney stones   . Microscopic hematuria   . Migraine   . Migraines   . Osteopenia   . Urinary disorder   . UTI (lower urinary tract infection)    Past Surgical History:  Procedure Laterality Date  . COLONOSCOPY N/A 11/05/2014   Procedure: COLONOSCOPY;  Surgeon: Scot Jun, MD;  Location: [facility] ENDOSCOPY;  Service: Endoscopy;  Laterality: N/A;  . CYSTOSCOPY  2007   with biopsy   . CYSTOSCOPY N/A 06/01/2016   Procedure: CYSTOSCOPY;  Surgeon: Vena Austria, MD;  Location: [facility] ORS;  Service: Gynecology;  Laterality: N/A;  . DIAGNOSTIC LAPAROSCOPY    . DILATATION & CURETTAGE/HYSTEROSCOPY WITH MYOSURE N/A 04/13/2015   Procedure: DILATATION & CURETTAGE/HYSTEROSCOPY WITH MYOSURE/POLYPECTOMY;  Surgeon: Elenora Fender Ward, MD;  Location: [facility] ORS;  Service: Gynecology;  Laterality: N/A;  . DILATATION & CURETTAGE/HYSTEROSCOPY WITH MYOSURE N/A 06/01/2016   Procedure: DILATATION & CURETTAGE/HYSTEROSCOPY WITH MYOSURE;  Surgeon: Vena Austria, MD;  Location: [facility] ORS;  Service: Gynecology;  Laterality: N/A;  . GUM SURGERY    . laproscopy  2007   Family History  Problem Relation Age of Onset  . Colon cancer Father 54  . Arthritis Father   . Hyperlipidemia Father   . Cancer Father        colon/prostate  . Transient ischemic attack Father   . Arthritis Mother   . Heart disease  Mother   . Stroke Mother        TIA  . Hypertension Mother   . Kidney disease Mother   . Diabetes Mother   . Cancer Maternal Grandmother   . Cancer Paternal Grandfather        prostate    Allergies: Lac bovis; Milk-related compounds; and Penicillins Current Outpatient Prescriptions on File Prior to Visit  Medication Sig Dispense Refill  . Calcium Carbonate-Vit D-Min (CALCIUM 1200 PO) Take 2-3 tablets by mouth daily. Reported on 05/26/2015    . gabapentin (NEURONTIN) 100 MG capsule 1 tablet po once daily on day 1, 1 tablet po bid day 2, then 1 tablet po tid 87 capsule 0  . norethindrone (AYGESTIN) 5 MG tablet Take 1 tablet (5 mg total) by mouth daily. 30 tablet 11   No current facility-administered medications on file prior to visit.     Social History  Substance Use Topics  . Smoking status: Never Smoker  . Smokeless tobacco: Never Used  . Alcohol use No    Review of Systems  Constitutional: Negative for chills and fever.  Respiratory: Negative for cough.   Cardiovascular: Negative for chest pain and palpitations.  Gastrointestinal: Negative for nausea and vomiting.      Objective:    BP 120/74   Pulse 70   Temp 97.7 F (36.5 C) (Oral)  Ht 5\' 10"  (1.778 m)   Wt 148 lb 3.2 oz (67.2 kg)   SpO2 98%   BMI 21.26 kg/m    Physical Exam  Constitutional: She appears well-developed and well-nourished.  Cardiovascular: Normal rate, regular rhythm, normal heart sounds and normal pulses.   Pulmonary/Chest: Effort normal and breath sounds normal. She has no wheezes. She has no rhonchi. She has no rales.  Abdominal: There is no CVA tenderness.  Neurological: She is alert.  Skin: Skin is warm and dry.  Psychiatric: She has a normal mood and affect. Her speech is normal and behavior is normal. Thought content normal.  Vitals reviewed.      Assessment & Plan:   1. Dysuria Afebrile. Patient is well-appearing. UA shows trace leukocytes, trace blood. We will treat empirically  UTI since patient's leaving town this week. Pending urine culture and also urine to evaluate for yeast, Gardnerella. Return precautions given. - POCT Urinalysis Dipstick - Urine Culture; Future - Urine Culture - Urine Microscopic Only - Urine cytology ancillary only - nitrofurantoin, macrocrystal-monohydrate, (MACROBID) 100 MG capsule; Take 1 capsule (100 mg total) by mouth 2 (two) times daily. Take with food.  Dispense: 10 capsule; Refill: 0    I am having Ms. Arlana Pouchate maintain her Calcium Carbonate-Vit D-Min (CALCIUM 1200 PO), gabapentin, norethindrone, and nitrofurantoin (macrocrystal-monohydrate).   Meds ordered this encounter  Medications  . DISCONTD: nitrofurantoin, macrocrystal-monohydrate, (MACROBID) 100 MG capsule    Sig: Take 1 capsule (100 mg total) by mouth 2 (two) times daily. Take with food.    Dispense:  10 capsule    Refill:  0    Order Specific Question:   Supervising Provider    Answer:   Duncan DullULLO, TERESA L [[id number]]  . nitrofurantoin, macrocrystal-monohydrate, (MACROBID) 100 MG capsule    Sig: Take 1 capsule (100 mg total) by mouth 2 (two) times daily. Take with food.    Dispense:  10 capsule    Refill:  0    Return precautions given.   Risks, benefits, and alternatives of the medications and treatment plan prescribed today were discussed, and patient expressed understanding.   Education regarding symptom management and diagnosis given to patient on AVS.  Continue to follow with Allegra GranaArnett, Margaret G, FNP for routine health maintenance.   Jeannette CorpusSharon Diveley and I agreed with plan.   Rennie PlowmanMargaret Arnett, FNP

## 2016-11-07 NOTE — Patient Instructions (Addendum)
Urine shows trace blood. Please ensure that you repeat your urine test at next follow to ensure has resolved.   As discussed, typically we wait for the urine culture to return before treatment. However since you are traveling, we will go ahead and empirically treat for urinary tract infection. We will also await another test to see also yeast, or bacterial vaginosis.  Please let me know if not better after antibiotics.  Plenty of water  Ensure to take probiotics while on antibiotics and also for 2 weeks after completion. It is important to re-colonize the gut with good bacteria and also to prevent any diarrheal infections associated with antibiotic use.

## 2016-11-09 ENCOUNTER — Telehealth: Payer: Self-pay | Admitting: Family

## 2016-11-09 LAB — URINE CULTURE: Organism ID, Bacteria: NO GROWTH

## 2016-11-09 NOTE — Telephone Encounter (Signed)
Pt called back returning your call. Thank you! °

## 2016-11-09 NOTE — Telephone Encounter (Signed)
Patient contacted and given results and stated awareness  

## 2016-11-10 ENCOUNTER — Telehealth: Payer: Self-pay | Admitting: *Deleted

## 2016-11-10 LAB — URINE CYTOLOGY ANCILLARY ONLY
Bacterial vaginitis: NEGATIVE
CANDIDA VAGINITIS: NEGATIVE

## 2016-11-13 NOTE — Progress Notes (Signed)
11/14/2016 3:49 PM   Cristina CorpusSharon Wade 1970/05/29 [id number]  Referring provider: Doreene Nestlark, Katherine K, NP [address] [place], [place] [address]  No chief complaint on file.   HPI: 46 yo WF with a history of dysfunctional voiding and chronic constipation presents today for a one year follow up.  Background history 46 year old female with history of dysfunctional voiding, chronic constipation who presents today for further evaluation of lower abdominal/bladder pain.  She has had extensive workup in the past including urodynamics which revealed delayed first sensation at 626cc, normal bladder capacity and pressures. Mild demonstrable stress urinary incontinence was appreciated. No evidence of urgency. EMG was consistent with poor relaxation of the external sphincter consistent with pseudo-DSD associated with incomplete bladder emptying.  Shortly thereafter, she was referred to physical therapy and had 1 session. She was taught some techniques including raising her feet above the level of her bladder when voiding and some core exercises.  She tried this for approximate 2 weeks and it did not help so she stopped. She is not been back to physical therapy.  Most recently, she underwent cystoscopy, excision of cervical polyp by Dr. Chauncey CruelStabler. 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 She brings pictures of this today.  She does report that back in February, she had an episode of lower abdominal pain associated with her bladder. She states that it was dull and painful and relieved with voiding. It lasted 3 or 4 days and spontaneously resolve. It has not recurred. She was prescribed Elmiron by her PCP but has not started this medication.  She does have severe chronic constipation. She only has a bowel movement proximal leg twice a week. She does occasionally take Benefiber  otherwise no maintenance bowel medications or  regimen.  Today, the patient has been experiencing urgency x 0-3, frequency x 4-7, not restricting fluids to avoid visits to the restroom, not engaging in toilet mapping, incontinence x 0-3 and nocturia x 0-3.  Her PVR is 377 mL.  She states she does not feel the urge to void.  She was then asked to try to void again and her PVR was 0 mL.  Her UA today is unremarkable.    PMH: Past Medical History:  Diagnosis Date  . Asthma   . Cystitis   . Cystocele   . Endometriosis   . Frequent headaches   . GERD (gastroesophageal reflux disease)    rare  . Gross hematuria   . History of kidney stones   . Microscopic hematuria   . Migraine   . Migraines   . Osteopenia   . Urinary disorder   . UTI (lower urinary tract infection)     Surgical History: Past Surgical History:  Procedure Laterality Date  . COLONOSCOPY N/A 11/05/2014   Procedure: COLONOSCOPY;  Surgeon: Scot Junobert T Elliott, MD;  [facility];  Service: Endoscopy;  Laterality: N/A;  . CYSTOSCOPY  2007   with biopsy   . CYSTOSCOPY N/A 06/01/2016   Procedure: CYSTOSCOPY;  Surgeon: Vena AustriaAndreas Staebler, MD;  Location: [facility] ORS;  Service: Gynecology;  Laterality: N/A;  . DIAGNOSTIC LAPAROSCOPY    . DILATATION & CURETTAGE/HYSTEROSCOPY WITH MYOSURE N/A 04/13/2015   Procedure: DILATATION & CURETTAGE/HYSTEROSCOPY WITH MYOSURE/POLYPECTOMY;  Surgeon: Elenora Fenderhelsea C Ward, MD;  Location: [facility] ORS;  Service: Gynecology;  Laterality: N/A;  . DILATATION & CURETTAGE/HYSTEROSCOPY WITH MYOSURE N/A 06/01/2016   Procedure: DILATATION & CURETTAGE/HYSTEROSCOPY WITH MYOSURE;  Surgeon:  Vena Austria, MD;  Location: [facility] ORS;  Service: Gynecology;  Laterality: N/A;  . GUM SURGERY    . laproscopy  2007    Home Medications:  Allergies as of 11/14/2016      Reactions   Lac Bovis Other (See Comments)   Milk-related Compounds    Penicillins Itching, Rash      Medication List       Accurate as of 11/14/16  3:49 PM. Always use your most recent med  list.          CALCIUM 1200 PO Take 2-3 tablets by mouth daily. Reported on 05/26/2015   gabapentin 100 MG capsule Commonly known as:  NEURONTIN 1 tablet po once daily on day 1, 1 tablet po bid day 2, then 1 tablet po tid   JOLESSA 0.15-0.03 MG tablet Generic drug:  levonorgestrel-ethinyl estradiol   nitrofurantoin (macrocrystal-monohydrate) 100 MG capsule Commonly known as:  MACROBID Take 1 capsule (100 mg total) by mouth 2 (two) times daily. Take with food.   norethindrone 5 MG tablet Commonly known as:  AYGESTIN Take 1 tablet (5 mg total) by mouth daily.       Allergies:  Allergies  Allergen Reactions  . Lac Bovis Other (See Comments)  . Milk-Related Compounds   . Penicillins Itching and Rash    Family History: Family History  Problem Relation Age of Onset  . Colon cancer Father 41  . Arthritis Father   . Hyperlipidemia Father   . Cancer Father        colon/prostate  . Transient ischemic attack Father   . Arthritis Mother   . Heart disease Mother   . Stroke Mother        TIA  . Hypertension Mother   . Kidney disease Mother   . Diabetes Mother   . Cancer Maternal Grandmother   . Cancer Paternal Grandfather        prostate    Social History:  reports that she has never smoked. She has never used smokeless tobacco. She reports that she does not drink alcohol or use drugs.  ROS: UROLOGY Frequent Urination?: No Hard to postpone urination?: No Burning/pain with urination?: No Get up at night to urinate?: No Leakage of urine?: Yes Urine stream starts and stops?: Yes Trouble starting stream?: Yes Do you have to strain to urinate?: Yes Blood in urine?: No Urinary tract infection?: No Sexually transmitted disease?: No Injury to kidneys or bladder?: No Painful intercourse?: No Weak stream?: Yes Currently pregnant?: No Vaginal bleeding?: No Last menstrual period?: n  Gastrointestinal Nausea?: No Vomiting?: No Indigestion/heartburn?: No Diarrhea?:  No Constipation?: Yes  Constitutional Fever: No Night sweats?: No Weight loss?: No Fatigue?: No  Skin Skin rash/lesions?: No Itching?: No  Eyes Blurred vision?: No Double vision?: No  Ears/Nose/Throat Sore throat?: No Sinus problems?: No  Hematologic/Lymphatic Swollen glands?: No Easy bruising?: No  Cardiovascular Leg swelling?: No Chest pain?: No  Respiratory Cough?: No Shortness of breath?: No  Endocrine Excessive thirst?: No  Musculoskeletal Back pain?: No Joint pain?: No  Neurological Headaches?: No Dizziness?: No  Psychologic Depression?: No Anxiety?: No  Physical Exam: BP 118/68 (BP Location: Left Arm, Patient Position: Sitting, Cuff Size: Normal)   Pulse 79   Ht 5\' 10"  (1.778 m)   Wt 148 lb 6.4 oz (67.3 kg)   BMI 21.29 kg/m   Constitutional:  Alert and oriented, No acute distress.  Accompanied by sister. HEENT: [place] AT, moist mucus membranes.  Trachea midline, no masses. Cardiovascular:  No clubbing, cyanosis, or edema. Respiratory: Normal respiratory effort, no increased work of breathing. GI: Abdomen is soft, nontender, nondistended, no abdominal masses GU: No CVA tenderness. Skin: No rashes, bruises or suspicious lesions. Lymph: No cervical or inguinal adenopathy. Neurologic: Grossly intact, no focal deficits, moving all 4 extremities. Psychiatric: Normal mood and affect.  Laboratory Data: Lab Results  Component Value Date   WBC 7.0 12/05/2015   HGB 14.0 12/05/2015   HCT 41.0 12/05/2015   MCV 90.8 12/05/2015   PLT 254 12/05/2015    Lab Results  Component Value Date   CREATININE 0.76 12/05/2015   Urinalysis Unremarkable.  See EPIC.    Results for orders placed or performed in visit on 11/14/16  Bladder Scan (Post Void Residual) in office  Result Value Ref Range   Scan Result 0    I have reviewed the labs    Assessment & Plan:    1. 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  - Treat chronic constipation as below - having a colonoscopy this week  - engage in timed voids while awake  - RTC in one month for PVR and OAB questionnaire  2. 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 Information pamphlets were reviewed and given today discussing the current understanding of the syndrome as well as treatment options. IC dietary information also given today as many patients experience relief with simple lifestyle modifications.   - Given that she is currently asymptomatic, would hold off on Elmiron on treatment  - Urinalysis, Complete  3. Chronic constipation  - Commend daily bowel regimen of Colace twice a day  - Recommend MiraLAX if no bowel movement at least every other day  - Recommend adequate hydration  - Explained association between bowel and bladder symptoms  - colonoscopy pending   Return in about 1 month (around 12/15/2016) for PVR and OAB questionnaire.  Michiel CowboySHANNON Rylon Poitra, PA-C  [facility] [address], [address] [place], [place] [address] [phone]

## 2016-11-14 ENCOUNTER — Encounter: Payer: Self-pay | Admitting: Urology

## 2016-11-14 ENCOUNTER — Ambulatory Visit: Payer: BC Managed Care – PPO | Admitting: Urology

## 2016-11-14 VITALS — BP 118/68 | HR 79 | Ht 70.0 in | Wt 148.4 lb

## 2016-11-14 DIAGNOSIS — R3989 Other symptoms and signs involving the genitourinary system: Secondary | ICD-10-CM | POA: Diagnosis not present

## 2016-11-14 DIAGNOSIS — M6289 Other specified disorders of muscle: Secondary | ICD-10-CM | POA: Diagnosis not present

## 2016-11-14 DIAGNOSIS — K5909 Other constipation: Secondary | ICD-10-CM

## 2016-11-14 LAB — URINALYSIS, COMPLETE
Bilirubin, UA: NEGATIVE
Glucose, UA: NEGATIVE
KETONES UA: NEGATIVE
NITRITE UA: NEGATIVE
Protein, UA: NEGATIVE
SPEC GRAV UA: 1.01 (ref 1.005–1.030)
Urobilinogen, Ur: 0.2 mg/dL (ref 0.2–1.0)
pH, UA: 7 (ref 5.0–7.5)

## 2016-11-14 LAB — BLADDER SCAN AMB NON-IMAGING: Scan Result: 0

## 2016-11-14 LAB — MICROSCOPIC EXAMINATION

## 2016-11-15 HISTORY — PX: COLONOSCOPY: SHX174

## 2016-11-15 NOTE — Progress Notes (Signed)
To PCP

## 2016-11-22 ENCOUNTER — Ambulatory Visit: Payer: BC Managed Care – PPO | Admitting: Obstetrics and Gynecology

## 2016-11-27 ENCOUNTER — Ambulatory Visit: Payer: BC Managed Care – PPO | Admitting: Family

## 2016-11-28 ENCOUNTER — Ambulatory Visit (INDEPENDENT_AMBULATORY_CARE_PROVIDER_SITE_OTHER): Payer: BC Managed Care – PPO | Admitting: Family

## 2016-11-28 ENCOUNTER — Ambulatory Visit (INDEPENDENT_AMBULATORY_CARE_PROVIDER_SITE_OTHER): Payer: BC Managed Care – PPO

## 2016-11-28 ENCOUNTER — Encounter: Payer: Self-pay | Admitting: Family

## 2016-11-28 VITALS — BP 130/80 | HR 68 | Temp 98.3°F | Ht 70.0 in | Wt 150.0 lb

## 2016-11-28 DIAGNOSIS — M545 Low back pain: Secondary | ICD-10-CM | POA: Diagnosis not present

## 2016-11-28 DIAGNOSIS — G8929 Other chronic pain: Secondary | ICD-10-CM

## 2016-11-28 DIAGNOSIS — H6123 Impacted cerumen, bilateral: Secondary | ICD-10-CM | POA: Diagnosis not present

## 2016-11-28 DIAGNOSIS — Z7689 Persons encountering health services in other specified circumstances: Secondary | ICD-10-CM

## 2016-11-28 NOTE — Progress Notes (Signed)
Subjective:    Patient ID: Cristina Wade, female    DOB: [birth year], 46 y.o.   MRN: [id number]  CC: Cristina Wade is a 46 y.o. female who presents today for follow up.   HPI: Complaints today are 'ear fullness'. Tried debrox without relief. No decreased hearing, sinus pressure, ear discharge, headache, vision changes, fever. Would like years "irrigated".   Complains of low back, for over a year, intermittent. On bilateral sides. No history of cancer. No numbness, tingling. Unaffected by movement. Describes as ache. No injury, rash.  States [facility] for 'stomach pain', intermittent 3 months, unchanged. No abdominal pain today. Has been seen by GI, normal CT abdomen 09/2016. Had colonoscopy and EGD one week ago and awaiting results.    States prior h/o chest pain. No longer having chest pain. 06/2016 [place] chest pain, ekg changes; no further intervention at this time; reports echo and stress test showed normal myocardial perfusion 'years ago' ( cannot find in chart).          HISTORY:  Past Medical History:  Diagnosis Date  . Asthma   . Cystitis   . Cystocele   . Endometriosis   . Frequent headaches   . GERD (gastroesophageal reflux disease)    rare  . Gross hematuria   . History of kidney stones   . Microscopic hematuria   . Migraine   . Migraines   . Osteopenia   . Urinary disorder   . UTI (lower urinary tract infection)    Past Surgical History:  Procedure Laterality Date  . COLONOSCOPY N/A 11/05/2014   Procedure: COLONOSCOPY;  Surgeon: Scot Junobert T Elliott, MD;  [facility] END[facility]COPY;  Service: Endoscopy;  Laterality: N/A;  . CYSTOSCOPY  2007   with biopsy   . CYSTOSCOPY N/A 06/01/2016   Procedure: CYSTOSCOPY;  Surgeon: Vena AustriaAndreas Staebler, MD;  Location: [facility] ORS;  Service: Gynecology;  Laterality: N/A;  . DIAGNOSTIC LAPAROSCOPY    . DILATATION & CURETTAGE/HYSTEROSCOPY WITH MYOSURE N/A 04/13/2015   Procedure: DILATATION & CURETTAGE/HYSTEROSCOPY WITH  MYOSURE/POLYPECTOMY;  Surgeon: Elenora Fenderhelsea C Ward, MD;  Location: [facility] ORS;  Service: Gynecology;  Laterality: N/A;  . DILATATION & CURETTAGE/HYSTEROSCOPY WITH MYOSURE N/A 06/01/2016   Procedure: DILATATION & CURETTAGE/HYSTEROSCOPY WITH MYOSURE;  Surgeon: Vena AustriaAndreas Staebler, MD;  Location: [facility] ORS;  Service: Gynecology;  Laterality: N/A;  . GUM SURGERY    . laproscopy  2007   Family History  Problem Relation Age of Onset  . Colon cancer Father 4250  . Arthritis Father   . Hyperlipidemia Father   . Cancer Father        colon/prostate  . Transient ischemic attack Father   . Arthritis Mother   . Heart disease Mother   . Stroke Mother        TIA  . Hypertension Mother   . Kidney disease Mother   . Diabetes Mother   . Cancer Maternal Grandmother   . Cancer Paternal Grandfather        prostate    Allergies: Lac bovis; Milk-related compounds; and Penicillins Current Outpatient Prescriptions on File Prior to Visit  Medication Sig Dispense Refill  . Calcium Carbonate-Vit D-Min (CALCIUM 1200 PO) Take 2-3 tablets by mouth daily. Reported on 05/26/2015    . levonorgestrel-ethinyl estradiol (JOLESSA) 0.15-0.03 MG tablet      No current facility-administered medications on file prior to visit.     Social History  Substance Use Topics  . Smoking status: Never Smoker  . Smokeless tobacco: Never Used  .  Alcohol use No    Review of Systems  Constitutional: Negative for chills and fever.  HENT: Negative for ear discharge, ear pain, hearing loss, sinus pressure and sore throat.   Eyes: Negative for visual disturbance.  Respiratory: Negative for cough.   Cardiovascular: Negative for chest pain and palpitations.  Gastrointestinal: Positive for abdominal pain (chronic). Negative for nausea and vomiting.  Musculoskeletal: Positive for back pain.  Neurological: Negative for headaches.      Objective:    BP 130/80   Pulse 68   Temp 98.3 F (36.8 C) (Oral)   Ht 5\' 10"  (1.778 m)   Wt 150 lb (68  kg)   SpO2 97%   BMI 21.52 kg/m  BP Readings from Last 3 Encounters:  11/28/16 130/80  11/14/16 118/68  11/07/16 120/74   Wt Readings from Last 3 Encounters:  11/28/16 150 lb (68 kg)  11/14/16 148 lb 6.4 oz (67.3 kg)  11/07/16 148 lb 3.2 oz (67.2 kg)    Physical Exam  Constitutional: She appears well-developed and well-nourished.  HENT:  Head: Normocephalic.  Right Ear: Hearing, tympanic membrane, external ear and ear canal normal. No drainage or swelling. Tympanic membrane is not erythematous. No decreased hearing is noted.  Left Ear: Hearing and external ear normal. No drainage or swelling. Tympanic membrane is not erythematous. No decreased hearing is noted.  Moderate cerumen bilateral ear canals.  Eyes: Conjunctivae are normal.  Cardiovascular: Normal rate, regular rhythm, normal heart sounds and normal pulses.   Pulmonary/Chest: Effort normal and breath sounds normal. She has no wheezes. She has no rhonchi. She has no rales.  Neurological: She is alert.  Skin: Skin is warm and dry.  Psychiatric: She has a normal mood and affect. Her speech is normal and behavior is normal. Thought content normal.  Vitals reviewed. Bilateral cerumen impactions, resolved with irrigation by CMA in both left and right ear.  After which, I also examined patient and the EAC's and TM's are clear. Patient tolerated procedure well.      Assessment & Plan:   Problem List Items Addressed This Visit      Nervous and Auditory   Bilateral impacted cerumen    Resolved.         Other   Encounter to establish care - Primary    Reviewed  prior history including history of atypical chest pain, abdominal pain. Reviewedworkup with patient today. At this time she declines any further testing and states she will follow with cardiology, gastroenterology as needed. Pending labs for CPE.      Relevant Orders   CBC with Differential/Platelet   Comprehensive metabolic panel   Hemoglobin A1c   Lipid panel    TSH   VITAMIN D 25 Hydroxy (Vit-D Deficiency, Fractures)   Chronic bilateral low back pain without sciatica    Chronic. Reassured of benign exam. Pending x-ray due to duration of symptoms. Advised conservative therapy including heat, occasional use of ibuprofen. Return precautions given.      Relevant Orders   DG Lumbar Spine Complete       I have discontinued Ms. Swetz's gabapentin, norethindrone, nitrofurantoin (macrocrystal-monohydrate), and omeprazole. I am also having her maintain her Calcium Carbonate-Vit D-Min (CALCIUM 1200 PO) and levonorgestrel-ethinyl estradiol.   Meds ordered this encounter  Medications  . DISCONTD: omeprazole (PRILOSEC) 40 MG capsule    Return precautions given.   Risks, benefits, and alternatives of the medications and treatment plan prescribed today were discussed, and patient expressed understanding.   Education  regarding symptom management and diagnosis given to patient on AVS.  Continue to follow with Burnard Hawthorne, FNP for routine health maintenance.   [name] and I agreed with plan.   Mable Paris, FNP

## 2016-11-28 NOTE — Progress Notes (Signed)
Pre visit review using our clinic review tool, if applicable. No additional management support is needed unless otherwise documented below in the visit note. 

## 2016-11-28 NOTE — Assessment & Plan Note (Signed)
Chronic. Reassured of benign exam. Pending x-ray due to duration of symptoms. Advised conservative therapy including heat, occasional use of ibuprofen. Return precautions given.

## 2016-11-28 NOTE — Assessment & Plan Note (Signed)
Reviewed  prior history including history of atypical chest pain, abdominal pain. Reviewedworkup with patient today. At this time she declines any further testing and states she will follow with cardiology, gastroenterology as needed. Pending labs for CPE.

## 2016-11-28 NOTE — Assessment & Plan Note (Signed)
Resolved

## 2016-11-28 NOTE — Patient Instructions (Signed)
Fasting labs when you are able  X-ray  Let's treat conservatively as we discussed.   Over-the-counter medications you may try for arthritic pain include:   ThermaCare patches   Capsaicin cream   Icy hot   If conservative treatment doesn't yield results, we will consider physical therapy, consult to Sports Medicine/Orthopedics for further evaluation, and imaging.   If there is no improvement in your symptoms, or if there is any worsening of symptoms, or if you have any additional concerns, please return for re-evaluation; or, if we are closed, consider going to the Emergency Room for evaluation if symptoms urgent.    Low Back Sprain Rehab Ask your health care provider which exercises are safe for you. Do exercises exactly as told by your health care provider and adjust them as directed. It is normal to feel mild stretching, pulling, tightness, or discomfort as you do these exercises, but you should stop right away if you feel sudden pain or your pain gets worse. Do not begin these exercises until told by your health care provider. Stretching and range of motion exercises These exercises warm up your muscles and joints and improve the movement and flexibility of your back. These exercises also help to relieve pain, numbness, and tingling. Exercise A: Lumbar rotation  1. Lie on your back on a firm surface and bend your knees. 2. Straighten your arms out to your sides so each arm forms an "L" shape with a side of your body (a 90 degree angle). 3. Slowly move both of your knees to one side of your body until you feel a stretch in your lower back. Try not to let your shoulders move off of the floor. 4. Hold for __________ seconds. 5. Tense your abdominal muscles and slowly move your knees back to the starting position. 6. Repeat this exercise on the other side of your body. Repeat __________ times. Complete this exercise __________ times a day. Exercise B: Prone extension on  elbows  1. Lie on your abdomen on a firm surface. 2. Prop yourself up on your elbows. 3. Use your arms to help lift your chest up until you feel a gentle stretch in your abdomen and your lower back. ? This will place some of your body weight on your elbows. If this is uncomfortable, try stacking pillows under your chest. ? Your hips should stay down, against the surface that you are lying on. Keep your hip and back muscles relaxed. 4. Hold for __________ seconds. 5. Slowly relax your upper body and return to the starting position. Repeat __________ times. Complete this exercise __________ times a day. Strengthening exercises These exercises build strength and endurance in your back. Endurance is the ability to use your muscles for a long time, even after they get tired. Exercise C: Pelvic tilt 1. Lie on your back on a firm surface. Bend your knees and keep your feet flat. 2. Tense your abdominal muscles. Tip your pelvis up toward the ceiling and flatten your lower back into the floor. ? To help with this exercise, you may place a small towel under your lower back and try to push your back into the towel. 3. Hold for __________ seconds. 4. Let your muscles relax completely before you repeat this exercise. Repeat __________ times. Complete this exercise __________ times a day. Exercise D: Alternating arm and leg raises  1. Get on your hands and knees on a firm surface. If you are on a hard floor, you may want to use  padding to cushion your knees, such as an exercise mat. 2. Line up your arms and legs. Your hands should be below your shoulders, and your knees should be below your hips. 3. Lift your left leg behind you. At the same time, raise your right arm and straighten it in front of you. ? Do not lift your leg higher than your hip. ? Do not lift your arm higher than your shoulder. ? Keep your abdominal and back muscles tight. ? Keep your hips facing the ground. ? Do not arch your  back. ? Keep your balance carefully, and do not hold your breath. 4. Hold for __________ seconds. 5. Slowly return to the starting position and repeat with your right leg and your left arm. Repeat __________ times. Complete this exercise __________ times a day. Exercise E: Abdominal set with straight leg raise  1. Lie on your back on a firm surface. 2. Bend one of your knees and keep your other leg straight. 3. Tense your abdominal muscles and lift your straight leg up, 4-6 inches (10-15 cm) off the ground. 4. Keep your abdominal muscles tight and hold for __________ seconds. ? Do not hold your breath. ? Do not arch your back. Keep it flat against the ground. 5. Keep your abdominal muscles tense as you slowly lower your leg back to the starting position. 6. Repeat with your other leg. Repeat __________ times. Complete this exercise __________ times a day. Posture and body mechanics  Body mechanics refers to the movements and positions of your body while you do your daily activities. Posture is part of body mechanics. Good posture and healthy body mechanics can help to relieve stress in your body's tissues and joints. Good posture means that your spine is in its natural S-curve position (your spine is neutral), your shoulders are pulled back slightly, and your head is not tipped forward. The following are general guidelines for applying improved posture and body mechanics to your everyday activities. Standing   When standing, keep your spine neutral and your feet about hip-width apart. Keep a slight bend in your knees. Your ears, shoulders, and hips should line up.  When you do a task in which you stand in one place for a long time, place one foot up on a stable object that is 2-4 inches (5-10 cm) high, such as a footstool. This helps keep your spine neutral. Sitting   When sitting, keep your spine neutral and keep your feet flat on the floor. Use a footrest, if necessary, and keep your  thighs parallel to the floor. Avoid rounding your shoulders, and avoid tilting your head forward.  When working at a desk or a computer, keep your desk at a height where your hands are slightly lower than your elbows. Slide your chair under your desk so you are close enough to maintain good posture.  When working at a computer, place your monitor at a height where you are looking straight ahead and you do not have to tilt your head forward or downward to look at the screen. Resting   When lying down and resting, avoid positions that are most painful for you.  If you have pain with activities such as sitting, bending, stooping, or squatting (flexion-based activities), lie in a position in which your body does not bend very much. For example, avoid curling up on your side with your arms and knees near your chest (fetal position).  If you have pain with activities such as standing for  a long time or reaching with your arms (extension-based activities), lie with your spine in a neutral position and bend your knees slightly. Try the following positions:  Lying on your side with a pillow between your knees.  Lying on your back with a pillow under your knees. Lifting   When lifting objects, keep your feet at least shoulder-width apart and tighten your abdominal muscles.  Bend your knees and hips and keep your spine neutral. It is important to lift using the strength of your legs, not your back. Do not lock your knees straight out.  Always ask for help to lift heavy or awkward objects. This information is not intended to replace advice given to you by your health care provider. Make sure you discuss any questions you have with your health care provider. Document Released: 04/03/2005 Document Revised: 12/09/2015 Document Reviewed: 01/13/2015 Elsevier Interactive Patient Education  [employer].

## 2016-11-29 ENCOUNTER — Other Ambulatory Visit (INDEPENDENT_AMBULATORY_CARE_PROVIDER_SITE_OTHER): Payer: BC Managed Care – PPO

## 2016-11-29 DIAGNOSIS — Z0000 Encounter for general adult medical examination without abnormal findings: Secondary | ICD-10-CM

## 2016-11-29 DIAGNOSIS — Z7689 Persons encountering health services in other specified circumstances: Secondary | ICD-10-CM

## 2016-11-29 LAB — COMPREHENSIVE METABOLIC PANEL
ALBUMIN: 4.2 g/dL (ref 3.5–5.2)
ALK PHOS: 27 U/L — AB (ref 39–117)
ALT: 8 U/L (ref 0–35)
AST: 11 U/L (ref 0–37)
BUN: 15 mg/dL (ref 6–23)
CHLORIDE: 105 meq/L (ref 96–112)
CO2: 29 mEq/L (ref 19–32)
Calcium: 9.5 mg/dL (ref 8.4–10.5)
Creatinine, Ser: 0.74 mg/dL (ref 0.40–1.20)
GFR: 88.94 mL/min (ref >60.00)
GLUCOSE: 92 mg/dL (ref 70–99)
POTASSIUM: 5 meq/L (ref 3.5–5.1)
SODIUM: 138 meq/L (ref 135–145)
TOTAL PROTEIN: 6.6 g/dL (ref 6.0–8.3)
Total Bilirubin: 0.6 mg/dL (ref 0.2–1.2)

## 2016-11-29 LAB — CBC WITH DIFFERENTIAL/PLATELET
BASOS PCT: 0.7 % (ref 0.0–3.0)
Basophils Absolute: 0 10*3/uL (ref 0.0–0.1)
EOS PCT: 1.1 % (ref 0.0–5.0)
Eosinophils Absolute: 0.1 10*3/uL (ref 0.0–0.7)
HCT: 40 % (ref 36.0–46.0)
Hemoglobin: 13.2 g/dL (ref 12.0–15.0)
LYMPHS ABS: 1.3 10*3/uL (ref 0.7–4.0)
Lymphocytes Relative: 23.4 % (ref 12.0–46.0)
MCHC: 33 g/dL (ref 30.0–36.0)
MCV: 93.3 fl (ref 78.0–100.0)
MONO ABS: 0.4 10*3/uL (ref 0.1–1.0)
MONOS PCT: 7.8 % (ref 3.0–12.0)
NEUTROS PCT: 67 % (ref 43.0–77.0)
Neutro Abs: 3.8 10*3/uL (ref 1.4–7.7)
Platelets: 239 10*3/uL (ref 150.0–400.0)
RBC: 4.29 Mil/uL (ref 3.87–5.11)
RDW: 13.2 % (ref 11.5–15.5)
WBC: 5.6 10*3/uL (ref 4.0–10.5)

## 2016-11-29 LAB — LIPID PANEL
Cholesterol: 178 mg/dL (ref 0–200)
HDL: 50.6 mg/dL (ref >39.00)
LDL Cholesterol: 113 mg/dL — ABNORMAL HIGH (ref 0–99)
NONHDL: 127.09
TRIGLYCERIDES: 70 mg/dL (ref 0.0–149.0)
Total CHOL/HDL Ratio: 4
VLDL: 14 mg/dL (ref 0.0–40.0)

## 2016-11-29 LAB — TSH: TSH: 1.79 u[IU]/mL (ref 0.35–4.50)

## 2016-11-29 LAB — HEMOGLOBIN A1C: HEMOGLOBIN A1C: 5.4 % (ref 4.6–6.5)

## 2016-11-29 LAB — VITAMIN D 25 HYDROXY (VIT D DEFICIENCY, FRACTURES): VITD: 46.3 ng/mL (ref 30.00–100.00)

## 2016-12-01 ENCOUNTER — Telehealth: Payer: Self-pay

## 2016-12-01 NOTE — Telephone Encounter (Signed)
Patient would like to switch her lexapro medication.

## 2016-12-01 NOTE — Telephone Encounter (Signed)
Patient would like to be switrched from lexapro to something else. I do not see lexapro on her list. Please advise.

## 2016-12-01 NOTE — Telephone Encounter (Signed)
Call pt and have her make an OV- that is something that needs to be discussed in person

## 2016-12-20 NOTE — Progress Notes (Deleted)
12/21/2016 11:41 AM   Cristina CorpusSharon Wade 07-10-1970 [id number]  Referring provider: Allegra GranaArnett, Margaret G, FNP [address] [place], [place] [id number]  No chief complaint on file.   HPI: 46 yo WF with a history of dysfunctional voiding and chronic constipation presents today for a one month follow up after a trial of timed voids.    Background history 46 year old female with history of dysfunctional voiding, chronic constipation who presents today for further evaluation of lower abdominal/bladder pain.  She has had extensive workup in the past including urodynamics which revealed delayed first sensation at 626cc, normal bladder capacity and pressures. Mild demonstrable stress urinary incontinence was appreciated. No evidence of urgency. EMG was consistent with poor relaxation of the external sphincter consistent with pseudo-DSD associated with incomplete bladder emptying.  Shortly thereafter, she was referred to physical therapy and had 1 session. She was taught some techniques including raising her feet above the level of her bladder when voiding and some core exercises.  She tried this for approximate 2 weeks and it did not help so she stopped. She is not been back to physical therapy.  Most recently, she underwent cystoscopy, excision of cervical polyp by Dr. Chauncey CruelStabler. 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 She brings pictures of this today.  She does report that back in February, she had an episode of lower abdominal pain associated with her bladder. She states that it was dull and painful and relieved with voiding. It lasted 3 or 4 days and spontaneously resolve. It has not recurred. She was prescribed Elmiron by her PCP but has not started this medication.  She does have severe chronic constipation. She only has a bowel movement proximal leg twice a week. She does occasionally take Benefiber  otherwise no  maintenance bowel medications or regimen.  Today, the patient has been experiencing urgency x 0-3, frequency x 4-7, not restricting fluids to avoid visits to the restroom, not engaging in toilet mapping, incontinence x 0-3 and nocturia x 0-3.  Her PVR is 377 mL.  She states she does not feel the urge to void.  She was then asked to try to void again and her PVR was 0 mL.  Her UA today is unremarkable.  ***  PMH: Past Medical History:  Diagnosis Date  . Asthma   . Cystitis   . Cystocele   . Endometriosis   . Frequent headaches   . GERD (gastroesophageal reflux disease)    rare  . Gross hematuria   . History of kidney stones   . Microscopic hematuria   . Migraine   . Migraines   . Osteopenia   . Urinary disorder   . UTI (lower urinary tract infection)     Surgical History: Past Surgical History:  Procedure Laterality Date  . COLONOSCOPY N/A 11/05/2014   Procedure: COLONOSCOPY;  Surgeon: Scot Junobert T Elliott, MD;  [facility];  Service: Endoscopy;  Laterality: N/A;  . CYSTOSCOPY  2007   with biopsy   . CYSTOSCOPY N/A 06/01/2016   Procedure: CYSTOSCOPY;  Surgeon: Vena AustriaAndreas Staebler, MD;  Location: [facility] ORS;  Service: Gynecology;  Laterality: N/A;  . DIAGNOSTIC LAPAROSCOPY    . DILATATION & CURETTAGE/HYSTEROSCOPY WITH MYOSURE N/A 04/13/2015   Procedure: DILATATION & CURETTAGE/HYSTEROSCOPY WITH MYOSURE/POLYPECTOMY;  Surgeon: Elenora Fenderhelsea C Ward, MD;  Location: [facility] ORS;  Service: Gynecology;  Laterality: N/A;  . DILATATION & CURETTAGE/HYSTEROSCOPY WITH MYOSURE N/A 06/01/2016  Procedure: DILATATION & CURETTAGE/HYSTEROSCOPY WITH MYOSURE;  Surgeon: Vena Austria, MD;  Location: [facility] ORS;  Service: Gynecology;  Laterality: N/A;  . GUM SURGERY    . laproscopy  2007    Home Medications:  Allergies as of 12/21/2016      Reactions   Lac Bovis Other (See Comments)   Milk-related Compounds    Penicillins Itching, Rash      Medication List       Accurate as of 12/20/16 11:41 AM.  Always use your most recent med list.          CALCIUM 1200 PO Take 2-3 tablets by mouth daily. Reported on 05/26/2015   JOLESSA 0.15-0.03 MG tablet Generic drug:  levonorgestrel-ethinyl estradiol       Allergies:  Allergies  Allergen Reactions  . Lac Bovis Other (See Comments)  . Milk-Related Compounds   . Penicillins Itching and Rash    Family History: Family History  Problem Relation Age of Onset  . Colon cancer Father 68  . Arthritis Father   . Hyperlipidemia Father   . Cancer Father        colon/prostate  . Transient ischemic attack Father   . Arthritis Mother   . Heart disease Mother   . Stroke Mother        TIA  . Hypertension Mother   . Kidney disease Mother   . Diabetes Mother   . Cancer Maternal Grandmother   . Cancer Paternal Grandfather        prostate    Social History:  reports that she has never smoked. She has never used smokeless tobacco. She reports that she does not drink alcohol or use drugs.  ROS:                                        Physical Exam: There were no vitals taken for this visit.  Constitutional:  Alert and oriented, No acute distress.  Accompanied by sister. HEENT: [place] AT, moist mucus membranes.  Trachea midline, no masses. Cardiovascular: No clubbing, cyanosis, or edema. Respiratory: Normal respiratory effort, no increased work of breathing. GI: Abdomen is soft, nontender, nondistended, no abdominal masses GU: No CVA tenderness. Skin: No rashes, bruises or suspicious lesions. Lymph: No cervical or inguinal adenopathy. Neurologic: Grossly intact, no focal deficits, moving all 4 extremities. Psychiatric: Normal mood and affect.  Laboratory Data: Lab Results  Component Value Date   WBC 5.6 11/29/2016   HGB 13.2 11/29/2016   HCT 40.0 11/29/2016   MCV 93.3 11/29/2016   PLT 239.0 11/29/2016    Lab Results  Component Value Date   CREATININE 0.74 11/29/2016   Urinalysis Unremarkable.  See  EPIC.    Results for orders placed or performed in visit on 11/29/16  CBC with Differential/Platelet  Result Value Ref Range   WBC 5.6 4.0 - 10.5 K/uL   RBC 4.29 3.87 - 5.11 Mil/uL   Hemoglobin 13.2 12.0 - 15.0 g/dL   HCT 16.1 09.6 - 04.5 %   MCV 93.3 78.0 - 100.0 fl   MCHC 33.0 30.0 - 36.0 g/dL   RDW 40.9 81.1 - 91.4 %   Platelets 239.0 150.0 - 400.0 K/uL   Neutrophils Relative % 67.0 43.0 - 77.0 %   Lymphocytes Relative 23.4 12.0 - 46.0 %   Monocytes Relative 7.8 3.0 - 12.0 %   Eosinophils Relative 1.1 0.0 - 5.0 %  Basophils Relative 0.7 0.0 - 3.0 %   Neutro Abs 3.8 1.4 - 7.7 K/uL   Lymphs Abs 1.3 0.7 - 4.0 K/uL   Monocytes Absolute 0.4 0.1 - 1.0 K/uL   Eosinophils Absolute 0.1 0.0 - 0.7 K/uL   Basophils Absolute 0.0 0.0 - 0.1 K/uL  Comprehensive metabolic panel  Result Value Ref Range   Sodium 138 135 - 145 mEq/L   Potassium 5.0 3.5 - 5.1 mEq/L   Chloride 105 96 - 112 mEq/L   CO2 29 19 - 32 mEq/L   Glucose, Bld 92 70 - 99 mg/dL   BUN 15 6 - 23 mg/dL   Creatinine, Ser 1.61 0.40 - 1.20 mg/dL   Total Bilirubin 0.6 0.2 - 1.2 mg/dL   Alkaline Phosphatase 27 (L) 39 - 117 U/L   AST 11 0 - 37 U/L   ALT 8 0 - 35 U/L   Total Protein 6.6 6.0 - 8.3 g/dL   Albumin 4.2 3.5 - 5.2 g/dL   Calcium 9.5 8.4 - 09.6 mg/dL   GFR 04.54 >09.81 mL/min  Hemoglobin A1c  Result Value Ref Range   Hgb A1c MFr Bld 5.4 4.6 - 6.5 %  Lipid panel  Result Value Ref Range   Cholesterol 178 0 - 200 mg/dL   Triglycerides 19.1 0.0 - 149.0 mg/dL   HDL 47.82 >95.62 mg/dL   VLDL 13.0 0.0 - 86.5 mg/dL   LDL Cholesterol 784 (H) 0 - 99 mg/dL   Total CHOL/HDL Ratio 4    NonHDL 127.09   TSH  Result Value Ref Range   TSH 1.79 0.35 - 4.50 uIU/mL  VITAMIN D 25 Hydroxy (Vit-D Deficiency, Fractures)  Result Value Ref Range   VITD 46.30 30.00 - 100.00 ng/mL   I have reviewed the labs    Assessment & Plan:    1. 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  - Treat chronic constipation as below - seen by GI - no colonoscopy performed  - engage in timed voids while awake  - RTC in one month for PVR and OAB questionnaire  2. 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 Information pamphlets were reviewed and given today discussing the current understanding of the syndrome as well as treatment options. IC dietary information also given today as many patients experience relief with simple lifestyle modifications.   - Given that she is currently asymptomatic, would hold off on Elmiron on treatment  - Urinalysis, Complete  3. Chronic constipation  - Commend daily bowel regimen of Colace twice a day  - Recommend MiraLAX if no bowel movement at least every other day  - Recommend adequate hydration  - Explained association between bowel and bladder symptoms  - colonoscopy pending   No Follow-up on file.  Michiel Cowboy, PA-C  [facility] [address] [place], [place] [address] [phone]

## 2016-12-21 ENCOUNTER — Ambulatory Visit: Payer: BC Managed Care – PPO | Admitting: Urology

## 2017-01-15 ENCOUNTER — Other Ambulatory Visit: Payer: Self-pay | Admitting: Obstetrics and Gynecology

## 2017-01-15 ENCOUNTER — Other Ambulatory Visit: Payer: Self-pay | Admitting: Family

## 2017-01-15 DIAGNOSIS — Z1231 Encounter for screening mammogram for malignant neoplasm of breast: Secondary | ICD-10-CM

## 2017-02-19 ENCOUNTER — Telehealth: Payer: Self-pay

## 2017-02-19 DIAGNOSIS — G8929 Other chronic pain: Secondary | ICD-10-CM

## 2017-02-19 DIAGNOSIS — M545 Low back pain: Principal | ICD-10-CM

## 2017-02-19 NOTE — Telephone Encounter (Signed)
Would you be willing to place referral for her to PT? If so, I will call and let her know.   Copied from CRM [phone]. Topic: Referral - Request >> Feb 19, 2017  3:13 PM Everardo Pacific[place], [place], [place] wrote: Reason for CRM: Patient would like to know is Dr.Arnett could  give her a referral to go to therapy for her back pain. If someone could please give her a call back about this matter

## 2017-02-20 ENCOUNTER — Ambulatory Visit (INDEPENDENT_AMBULATORY_CARE_PROVIDER_SITE_OTHER): Payer: BC Managed Care – PPO | Admitting: Obstetrics and Gynecology

## 2017-02-20 ENCOUNTER — Encounter: Payer: Self-pay | Admitting: Obstetrics and Gynecology

## 2017-02-20 ENCOUNTER — Other Ambulatory Visit: Payer: Self-pay | Admitting: Obstetrics and Gynecology

## 2017-02-20 VITALS — BP 126/78 | Ht 70.0 in | Wt 153.0 lb

## 2017-02-20 DIAGNOSIS — Z124 Encounter for screening for malignant neoplasm of cervix: Secondary | ICD-10-CM

## 2017-02-20 DIAGNOSIS — Z1151 Encounter for screening for human papillomavirus (HPV): Secondary | ICD-10-CM

## 2017-02-20 DIAGNOSIS — R102 Pelvic and perineal pain: Secondary | ICD-10-CM

## 2017-02-20 DIAGNOSIS — Z1231 Encounter for screening mammogram for malignant neoplasm of breast: Secondary | ICD-10-CM

## 2017-02-20 DIAGNOSIS — Z3041 Encounter for surveillance of contraceptive pills: Secondary | ICD-10-CM

## 2017-02-20 DIAGNOSIS — G8929 Other chronic pain: Secondary | ICD-10-CM | POA: Diagnosis not present

## 2017-02-20 DIAGNOSIS — Z01419 Encounter for gynecological examination (general) (routine) without abnormal findings: Secondary | ICD-10-CM | POA: Diagnosis not present

## 2017-02-20 DIAGNOSIS — Z1239 Encounter for other screening for malignant neoplasm of breast: Secondary | ICD-10-CM

## 2017-02-20 MED ORDER — LEVONORGEST-ETH ESTRAD 91-DAY 0.15-0.03 MG PO TABS: 1.0000 | ORAL_TABLET | Freq: Every day | ORAL | 4 refills | Status: DC

## 2017-02-20 MED ORDER — LEVONORGEST-ETH ESTRAD 91-DAY 0.15-0.03 MG PO TABS: 1.0000 | ORAL_TABLET | Freq: Every day | ORAL | 3 refills | Status: DC

## 2017-02-20 NOTE — Telephone Encounter (Signed)
PT order placed Let pt know

## 2017-02-20 NOTE — Patient Instructions (Signed)
I value your feedback and appreciate you entrusting us with your care. If you get a [facility] patient survey, I would appreciate you taking the time to let us know what your experience was like. Thank you! 

## 2017-02-20 NOTE — Progress Notes (Signed)
PCP:  [name], FNP   Chief Complaint  Patient presents with  . Annual Exam     HPI:      [name] is a 46 y.o. No obstetric history on file. who LMP was Patient's last menstrual period was 12/16/2016., presents today for her annual examination.  Her menses are Q3 months with OCPs, lasting 2 days, very light.  Dysmenorrhea none. She does have occas intermenstrual bleeding. Her CPP/DUB sx from 2/18 are improved. She never saw pelvic PT because pain improved. Dr. Bonney Aid didn't feel pain was related to endometriosis since none seen on eval.   Sex activity: never Last Pap: February 14, 2016  Results were: no abnormalities ; neg HPV DNA 8/15. Likes yearly paps. Hx of STDs: none  Last mammogram: February 21, 2016  Results were: normal--routine follow-up in 12 months. Mammo sched 03/07/17 There is a FH of breast cancer in her mat cousin, genetic testing not indicated for pt. There is no FH of ovarian cancer. The patient does not do self-breast exams.  Tobacco use: The patient denies current or previous tobacco use. Alcohol use: none No drug use.  Exercise: moderately active  She does get adequate calcium and Vitamin D in her diet.  Labs with PCP.   She had upper GI and colonoscopy 8/18. Repeat colonoscopy due in 3 yrs due to FH of colon cancer in her father. Pt doesn't qualify for cancer genetic testing. She has a hx of constipation, improved with colace. CPP improved with regular BMs.  Past Medical History:  Diagnosis Date  . Asthma   . Chronic pelvic pain in female 2018  . Cystitis   . Cystocele   . Endometrial polyp   . Endometriosis   . Frequent headaches   . Gastritis   . GERD (gastroesophageal reflux disease)    rare  . Gross hematuria   . History of kidney stones   . Microscopic hematuria   . Migraine   . Migraines   . Osteopenia   . Urinary disorder   . UTI (lower urinary tract infection)     Past Surgical History:  Procedure Laterality Date    . COLONOSCOPY  11/2016   Dr. Mechele Collin  . CYSTOSCOPY  2007   with biopsy   . DIAGNOSTIC LAPAROSCOPY    . GUM SURGERY    . laproscopy  2007  . POLYPECTOMY     endometrial    Family History  Problem Relation Age of Onset  . Colon cancer Father 41  . Arthritis Father   . Hyperlipidemia Father   . Transient ischemic attack Father   . Cancer - Colon Father 49  . Prostate cancer Father 47  . Arthritis Mother   . Heart disease Mother   . Stroke Mother        TIA  . Hypertension Mother   . Diabetes Mother   . Kidney cancer Mother 58  . Cancer Maternal Grandmother   . Cancer Paternal Grandfather        prostate  . Breast cancer Cousin 33    Social History   Socioeconomic History  . Marital status: Single    Spouse name: Not on file  . Number of children: Not on file  . Years of education: Not on file  . Highest education level: Not on file  Social Needs  . Financial resource strain: Not on file  . Food insecurity - worry: Not on file  . Food insecurity - inability:  Not on file  . Transportation needs - medical: Not on file  . Transportation needs - non-medical: Not on file  Occupational History  . Not on file  Tobacco Use  . Smoking status: Never Smoker  . Smokeless tobacco: Never Used  Substance and Sexual Activity  . Alcohol use: No    Alcohol/week: 0.0 oz  . Drug use: No  . Sexual activity: No  Other Topics Concern  . Not on file  Social History Narrative   Lives with twin sister   Work- [employer]    No pets    No children    Right handed    No caffeine daily- tea occasionally; eats chocolate    Enjoys shopping, resting, [place].     Current Meds  Medication Sig  . Calcium Carbonate-Vit D-Min (CALCIUM 1200 PO) Take 2-3 tablets by mouth daily. Reported on 05/26/2015  . levonorgestrel-ethinyl estradiol (JOLESSA) 0.15-0.03 MG tablet Take 1 tablet daily by mouth.  . ranitidine (ZANTAC) 150 MG tablet Take 150 mg 2  (two) times daily by mouth.  . [DISCONTINUED] levonorgestrel-ethinyl estradiol (JOLESSA) 0.15-0.03 MG tablet      ROS:  Review of Systems  Constitutional: Negative for fatigue, fever and unexpected weight change.  Respiratory: Negative for cough, shortness of breath and wheezing.   Cardiovascular: Negative for chest pain, palpitations and leg swelling.  Gastrointestinal: Positive for constipation. Negative for blood in stool, diarrhea, nausea and vomiting.  Endocrine: Negative for cold intolerance, heat intolerance and polyuria.  Genitourinary: Negative for dyspareunia, dysuria, flank pain, frequency, genital sores, hematuria, menstrual problem, pelvic pain, urgency, vaginal bleeding, vaginal discharge and vaginal pain.  Musculoskeletal: Negative for back pain, joint swelling and myalgias.  Skin: Negative for rash.  Neurological: Negative for dizziness, syncope, light-headedness, numbness and headaches.  Hematological: Negative for adenopathy.  Psychiatric/Behavioral: Negative for agitation, confusion, sleep disturbance and suicidal ideas. The patient is not nervous/anxious.      Objective: BP 126/78   Ht 5\' 10"  (1.778 m)   Wt 153 lb (69.4 kg)   LMP 12/16/2016   BMI 21.95 kg/m    Physical Exam  Constitutional: She is oriented to person, place, and time. She appears well-developed and well-nourished.  Genitourinary: Vagina normal and uterus normal. There is no rash or tenderness on the right labia. There is no rash or tenderness on the left labia. No erythema or tenderness in the vagina. No vaginal discharge found. Right adnexum does not display mass and does not display tenderness. Left adnexum does not display mass and does not display tenderness. Cervix does not exhibit motion tenderness or polyp. Uterus is not enlarged or tender.  Neck: Normal range of motion. No thyromegaly present.  Cardiovascular: Normal rate, regular rhythm and normal heart sounds.  No murmur  heard. Pulmonary/Chest: Effort normal and breath sounds normal. Right breast exhibits no mass, no nipple discharge, no skin change and no tenderness. Left breast exhibits no mass, no nipple discharge, no skin change and no tenderness.  Abdominal: Soft. There is no tenderness. There is no guarding.  Musculoskeletal: Normal range of motion.  Neurological: She is alert and oriented to person, place, and time. No cranial nerve deficit.  Psychiatric: She has a normal mood and affect. Her behavior is normal.  Vitals reviewed.   Assessment/Plan: Encounter for annual routine gynecological examination  Cervical cancer screening - Plan: IGP, Aptima HPV  Screening for HPV (human papillomavirus) - Plan: IGP, Aptima HPV  Encounter for surveillance of  contraceptive pills - OCP RF.  - Plan: levonorgestrel-ethinyl estradiol (JOLESSA) 0.15-0.03 MG tablet  Screening for breast cancer - Pt has mammo sched.  Chronic pelvic pain in female - Sx improved. Question related to constipation. F/u prn.           GYN counsel mammography screening, adequate intake of calcium and vitamin D, diet and exercise     F/U  Return in about 1 year (around 02/20/2018).  Alicia B. Copland, PA-C 02/20/2017 1:58 PM

## 2017-02-20 NOTE — Telephone Encounter (Signed)
Patient is aware 

## 2017-02-22 ENCOUNTER — Telehealth: Payer: Self-pay | Admitting: *Deleted

## 2017-02-22 LAB — IGP, APTIMA HPV
HPV APTIMA: NEGATIVE
PAP SMEAR COMMENT: 0

## 2017-02-22 NOTE — Telephone Encounter (Signed)
Please advise 

## 2017-02-22 NOTE — Telephone Encounter (Signed)
I coded it to   Chronic bilateral low back pain without sciatica    I have no idea what code she would need.   Erie NoeVanessa, can you reach out to patient?

## 2017-02-22 NOTE — Telephone Encounter (Signed)
      Copied from CRM #[id number]. Topic: Inquiry >> Feb 21, 2017  5:06 PM Everardo Pacific[place], [place], [place] wrote: Reason for CRM: Patient called because she is having a problem with the way her insurance was put in coding. Patient was told to speak with Claris CheMargaret about this . Patient does have the number to billing

## 2017-02-22 NOTE — Telephone Encounter (Signed)
Patient stated that margaret would have to change code for Xray of AUG 15,2018 to be covered by insurance.  Patient was not informed of a code that wouls be covered as example. Please advise.

## 2017-02-23 NOTE — Telephone Encounter (Signed)
I will route this to the [facility] Coding Pool.

## 2017-02-26 NOTE — Telephone Encounter (Signed)
thanks

## 2017-02-27 ENCOUNTER — Ambulatory Visit: Payer: BC Managed Care – PPO | Attending: Family

## 2017-02-27 DIAGNOSIS — M545 Low back pain: Secondary | ICD-10-CM | POA: Diagnosis present

## 2017-02-27 DIAGNOSIS — G8929 Other chronic pain: Secondary | ICD-10-CM | POA: Insufficient documentation

## 2017-02-27 NOTE — Patient Instructions (Signed)
  Bridge   Lie on back, legs bent. Press both hands back against surface. Lift hips up. Hold for 5 seconds. Repeat __10__ times. Do __3__ sessions per day.  Copyright  VHI. All rights reserved.

## 2017-02-27 NOTE — Therapy (Signed)
[facility] [address], [place], [address] Phone: [phone]   Fax:  [phone]  Physical Therapy Evaluation   Patient Details  Name: Cristina Wade MRN: [id number] Date of Birth: December 13, 1970 Referring Provider: Rennie Plowman, FNP   Encounter Date: 02/27/2017  PT End of Session - 02/27/17 1527    Visit Number  1    Number of Visits  13    Date for PT Re-Evaluation  04/12/17    PT Start Time  1527    PT Stop Time  1624    PT Time Calculation (min)  57 min    Activity Tolerance  Patient tolerated treatment well    Behavior During Therapy  [facility] for tasks assessed/performed       Past Medical History:  Diagnosis Date  . Asthma   . Chronic pelvic pain in female 2018  . Cystitis   . Cystocele   . Endometrial polyp   . Endometriosis   . Frequent headaches   . Gastritis   . GERD (gastroesophageal reflux disease)    rare  . Gross hematuria   . History of kidney stones   . Microscopic hematuria   . Migraine   . Migraines   . Osteopenia   . Urinary disorder   . UTI (lower urinary tract infection)     Past Surgical History:  Procedure Laterality Date  . COLONOSCOPY  11/2016   Dr. Mechele Collin  . CYSTOSCOPY  2007   with biopsy   . DIAGNOSTIC LAPAROSCOPY    . GUM SURGERY    . laproscopy  2007  . POLYPECTOMY     endometrial    There were no vitals filed for this visit.  Subjective Assessment - 02/27/17 1531    Subjective  Back pain: 1/10 currently (pt sitting), 8/10 at most for the past 2 months    Pertinent History  Chronic low back pain. Back hurts when she bends over which is the main problem. No pain going down her LE. Denies loss of bowel or bladder control.  Has had back pain for a while and just lived with it for at least a year. Sister states pt has poor posture.     Patient Stated Goals  Be able to do her chores without back pain.     Currently in Pain?  Yes    Pain Score  1     Pain  Location  Back    Pain Orientation  Lower    Pain Descriptors / Indicators  Constant    Pain Type  Chronic pain    Pain Onset  More than a month ago    Pain Frequency  Occasional    Aggravating Factors   Bending over, standing up for long periods (about 1 hour), picking up something from the floor, cleaning the floor, placing dishes at the dishwasher    Pain Relieving Factors  standing still (until it gets worse)         [facility] PT Assessment - 02/27/17 1539      Assessment   Medical Diagnosis  Chronic bilateral low back pain without sciatica    Referring Provider  Rennie Plowman, FNP    Onset Date/Surgical Date  02/20/17 Date PT referral signed. Chronic condition      Precautions   Precaution Comments  Osteopenia      Restrictions   Other Position/Activity Restrictions  No known restrictions      Balance Screen  Has the patient fallen in the past 6 months  No    Has the patient had a decrease in activity level because of a fear of falling?   No pt states some fear of falling    Is the patient reluctant to leave their home because of a fear of falling?   No pt states some fear of falling      Home Environment   Additional Comments  Pt lives in a 1 story home with her sister. No stairs.      Prior Function   Vocation  Full time employment [employer]    Vocation Requirements  PLOF: better able to perform chores involving bending over      Observation/Other Assessments   Observations  Supine position: R LE longer compared to L. Long sitting: R LE longer compared to L LE      Posture/Postural Control   Posture Comments  slight L lateral shift, slight bilaterally protracted shoulders      AROM   Overall AROM Comments  no pain with repeated flexion test    Lumbar Flexion  full    Lumbar Extension  full    Lumbar - Right Side Bend  full    Lumbar - Left Side Bend  full     Lumbar - Right Rotation  full    Lumbar - Left Rotation  WFL      Strength   Right Hip Flexion  4+/5     Right Hip Extension  4-/5    Right Hip ABduction  4+/5    Left Hip Flexion  4+/5    Left Hip Extension  3+/5    Left Hip ABduction  4+/5    Right Knee Flexion  4+/5    Right Knee Extension  5/5    Left Knee Flexion  4/5    Left Knee Extension  5/5      Palpation   Palpation comment  no TTP to low back      Ambulation/Gait   Gait Comments  lateral lean during stance phase of gait L > R        Objectives  Both sisters Jasmine December(Yukiko and Clydie BraunKaren) provided verbal permission to talk about each other's treatment to each other (-) Slump bilateral LE   There-ex  L S/L R hip flexion manually resisted by PT 10x5 seconds for 3 sets  Supine bridge 10x2 with 5 seconds  Reviewed ergonomic lifting   Try heel lift next visit for L LE if appropriate    Improved exercise technique, movement at target joints, use of target muscles after min to mod verbal, visual, tactile cues.                 PT Education - 02/27/17 1710    Education provided  Yes    Education Details  ther-ex, HEP, plan of care    Person(s) Educated  Patient    Methods  Explanation;Demonstration;Tactile cues;Verbal cues;Handout    Comprehension  Verbalized understanding;Returned demonstration          PT Long Term Goals - 02/27/17 1641      PT LONG TERM GOAL #1   Title  Patient will have a decrease in back pain to 4/10 or less at worst to promote ability to perform chores at home.     Baseline  8/10 at most for the past 2 months. 02/27/2017    Time  6    Period  Weeks    Status  New  Target Date  04/12/17      PT LONG TERM GOAL #2   Title  Patient will improve bilateral glute max strength to promote ability to perform standing tasks with less back pain.     Time  6    Period  Weeks    Status  New    Target Date  04/12/17      PT LONG TERM GOAL #3   Title  Pt will report being able to perform chores such as picking up something from the floor, cleaning the floor, and placing dishes in the  dishwasher with less back pain as a demonstration of improved function.     Baseline  Back pain increases when performing chores involving bending over at home. 02/27/2017    Time  6    Period  Weeks    Status  New    Target Date  04/12/17            Plan - 02/27/17 1635    Clinical Impression Statement  Pt is a 46 year old female who came to physical therapy secondary to chronic back pain. She also presents with bilateral glute max weakness, altered gait pattern and posture, trunk weakness, negative repeated flexion and slump tests, and difficulty performing functional tasks such as picking up items from the floor, placing dishes in the dishwasher, tasks involving bending over, and tolerating standing for long periods. Patient will benefit from skilled physical therapy services to address the aforementioned deficits.     History and Personal Factors relevant to plan of care:  Chronicity of condition, glute max weakness    Clinical Presentation  Stable    Clinical Presentation due to:  Back pain is the same since onset per medical screening form    Clinical Decision Making  Low    Rehab Potential  Good    Clinical Impairments Affecting Rehab Potential  Chronicity of condition    PT Frequency  2x / week    PT Duration  6 weeks    PT Treatment/Interventions  Therapeutic exercise;Therapeutic activities;Manual techniques;Patient/family education;Dry needling;Neuromuscular re-education;Ultrasound;Electrical Stimulation;Iontophoresis 4mg /ml Dexamethasone;Aquatic Therapy;Traction traction if appropriate    PT Next Visit Plan  core and glute max strengthening, lumbopelvic stability and control, ergonomics    Consulted and Agree with Plan of Care  Patient       Patient will benefit from skilled therapeutic intervention in order to improve the following deficits and impairments:  Pain, Postural dysfunction, Improper body mechanics, Decreased strength  Visit Diagnosis: Chronic bilateral low  back pain, with sciatica presence unspecified - Plan: PT plan of care cert/re-cert     Problem List Patient Active Problem List   Diagnosis Date Noted  . Bilateral impacted cerumen 11/28/2016  . Chronic bilateral low back pain without sciatica 11/28/2016  . Chest tightness 06/27/2016  . Preventative health care 09/30/2015  . Encounter to establish care 09/28/2014  . Vestibular migraine 09/28/2014  . Encounter for screening colonoscopy 11/21/2013    Cristina FreshwaterMiguel Nahiem Dredge PT, DPT   02/27/2017, 5:22 PM  [facility] [address] [place], [place], [id number] Phone: [phone]   Fax:  [phone]  Name: Cristina CorpusSharon Raphael MRN: [id number] Date of Birth: 06/16/1970

## 2017-03-01 ENCOUNTER — Ambulatory Visit: Payer: BC Managed Care – PPO

## 2017-03-01 DIAGNOSIS — M545 Low back pain: Secondary | ICD-10-CM | POA: Diagnosis not present

## 2017-03-01 DIAGNOSIS — G8929 Other chronic pain: Secondary | ICD-10-CM

## 2017-03-01 NOTE — Patient Instructions (Signed)
Strengthening: Resisted Extension    Hold band, one in each hand, arms forward. Pull arms back, elbow straight. Hold for 5 seconds Repeat ___10_ times per set. Do _3___ sets per session. Do _1__ sessions per day.  http://orth.exer.us/832   Copyright  VHI. All rights reserved.    

## 2017-03-01 NOTE — Therapy (Signed)
[place] [facility] PHYSICAL AND SPORTS MEDICINE [address] [place], [place], [address] Phone: [phone]   Fax:  [phone]  Physical Therapy Treatment  Patient Details  Name: Cristina Wade MRN: [id number] Date of Birth: 23-Apr-1970 Referring Provider: Rennie PlowmanMargaret Arnett, FNP   Encounter Date: 03/01/2017  PT End of Session - 03/01/17 1529    Visit Number  2    Number of Visits  13    Date for PT Re-Evaluation  04/12/17    PT Start Time  1529    PT Stop Time  1615    PT Time Calculation (min)  46 min    Activity Tolerance  Patient tolerated treatment well    Behavior During Therapy  [facility]       Past Medical History:  Diagnosis Date  . Asthma   . Chronic pelvic pain in female 2018  . Cystitis   . Cystocele   . Endometrial polyp   . Endometriosis   . Frequent headaches   . Gastritis   . GERD (gastroesophageal reflux disease)    rare  . Gross hematuria   . History of kidney stones   . Microscopic hematuria   . Migraine   . Migraines   . Osteopenia   . Urinary disorder   . UTI (lower urinary tract infection)     Past Surgical History:  Procedure Laterality Date  . COLONOSCOPY N/A 11/05/2014   Procedure: COLONOSCOPY;  Surgeon: Scot Junobert T Elliott, MD;  [facility];  Service: Endoscopy;  Laterality: N/A;  . COLONOSCOPY  11/2016   Dr. Mechele CollinElliott  . CYSTOSCOPY  2007   with biopsy   . CYSTOSCOPY N/A 06/01/2016   Procedure: CYSTOSCOPY;  Surgeon: Vena AustriaAndreas Staebler, MD;  Location: [facility] ORS;  Service: Gynecology;  Laterality: N/A;  . DIAGNOSTIC LAPAROSCOPY    . DILATATION & CURETTAGE/HYSTEROSCOPY WITH MYOSURE N/A 04/13/2015   Procedure: DILATATION & CURETTAGE/HYSTEROSCOPY WITH MYOSURE/POLYPECTOMY;  Surgeon: Elenora Fenderhelsea C Ward, MD;  Location: [facility] ORS;  Service: Gynecology;  Laterality: N/A;  . DILATATION & CURETTAGE/HYSTEROSCOPY WITH MYOSURE N/A 06/01/2016   Procedure: DILATATION & CURETTAGE/HYSTEROSCOPY WITH  MYOSURE;  Surgeon: Vena AustriaAndreas Staebler, MD;  Location: [facility] ORS;  Service: Gynecology;  Laterality: N/A;  . GUM SURGERY    . laproscopy  2007  . POLYPECTOMY     endometrial    There were no vitals filed for this visit.  Subjective Assessment - 03/01/17 1529    Subjective  Legs and hips hurt after last session. Did bridges last night which bothered her a little bit, did bridges this morning and it did not bother her at all.  No back and LE pain.  Feel good right now. Back did not hurt as bad when she bent over at work.     Pertinent History  Chronic low back pain. Back hurts when she bends over which is the main problem. No pain going down her LE. Denies loss of bowel or bladder control.  Has had back pain for a while and just lived with it for at least a year. Sister states pt has poor posture.     Patient Stated Goals  Be able to do her chores without back pain.     Currently in Pain?  No/denies    Pain Score  0-No pain    Pain Onset  More than a month ago  PT Education - 03/01/17 1621    Education provided  Yes    Education Details  ther-ex, HEP    Person(s) Educated  Patient    Methods  Explanation;Demonstration;Tactile cues;Verbal cues;Handout    Comprehension  Verbalized understanding;Returned demonstration         Objectives  There-ex   Standing straight pallof press yellow band for 10x3 with 5 second holds  Standing bilateral shoulder extension resisting yellow band 10x3 with 5 second holds    Reviewed and given as part of her HEP. Pt demonstrated and verbalized understanding. Handout provided.   Sitting on physioball    Gentle manual perturbation from PT 1 minute x 3     Manually resisted trunk flexion from PT 10x5 seconds for 3 sets  Standing mini squats with yellow band resisting hip abduction/ER with bilateral UE assist 10x3  Single leg squat R LE with L foot sliding back on slider with bilateral UE assist  10x3  Seated bilateral shoulder extension isometrics 10x3 with 5 second holds to promote trunk muscle use     Improved exercise technique, movement at target joints, use of target muscles after min to mod verbal, visual, tactile cues.   Improved ability to perform tasks involving bending over with less back pain based on pt subjective reports. Continued working on trunk and hip muscle strengthening and proper back, hip, and knee posture when performing squats. Difficulty with femoral control which improved after cues. Pt tolerated session well without aggravation of symptoms.         PT Long Term Goals - 02/27/17 1641      PT LONG TERM GOAL #1   Title  Patient will have a decrease in back pain to 4/10 or less at worst to promote ability to perform chores at home.     Baseline  8/10 at most for the past 2 months. 02/27/2017    Time  6    Period  Weeks    Status  New    Target Date  04/12/17      PT LONG TERM GOAL #2   Title  Patient will improve bilateral glute max strength to promote ability to perform standing tasks with less back pain.     Time  6    Period  Weeks    Status  New    Target Date  04/12/17      PT LONG TERM GOAL #3   Title  Pt will report being able to perform chores such as picking up something from the floor, cleaning the floor, and placing dishes in the dishwasher with less back pain as a demonstration of improved function.     Baseline  Back pain increases when performing chores involving bending over at home. 02/27/2017    Time  6    Period  Weeks    Status  New    Target Date  04/12/17            Plan - 03/01/17 1621    Clinical Impression Statement  Improved ability to perform tasks involving bending over with less back pain based on pt subjective reports. Continued working on trunk and hip muscle strengthening and proper back, hip, and knee posture when performing squats. Difficulty with femoral control which improved after cues. Pt  tolerated session well without aggravation of symptoms.     History and Personal Factors relevant to plan of care:  Chronicity of condition, glute max weakness     Clinical Presentation  Stable  Clinical Presentation due to:  improved ability to perform task bending over at work with less back pain    Clinical Decision Making  Low    Rehab Potential  Good    Clinical Impairments Affecting Rehab Potential  Chronicity of condition    PT Frequency  2x / week    PT Duration  6 weeks    PT Treatment/Interventions  Therapeutic exercise;Therapeutic activities;Manual techniques;Patient/family education;Dry needling;Neuromuscular re-education;Ultrasound;Electrical Stimulation;Iontophoresis 4mg /ml Dexamethasone;Aquatic Therapy;Traction traction if appropriate    PT Next Visit Plan  core and glute max strengthening, lumbopelvic stability and control, ergonomics    Consulted and Agree with Plan of Care  Patient       Patient will benefit from skilled therapeutic intervention in order to improve the following deficits and impairments:  Pain, Postural dysfunction, Improper body mechanics, Decreased strength  Visit Diagnosis: Chronic bilateral low back pain, with sciatica presence unspecified     Problem List Patient Active Problem List   Diagnosis Date Noted  . Bilateral impacted cerumen 11/28/2016  . Chronic bilateral low back pain without sciatica 11/28/2016  . Chest tightness 06/27/2016  . Preventative health care 09/30/2015  . Encounter to establish care 09/28/2014  . Vestibular migraine 09/28/2014  . Encounter for screening colonoscopy 11/21/2013    Loralyn FreshwaterMiguel Colie Fugitt PT, DPT   03/01/2017, 4:28 PM  [place] [facility] [address] [place], [place], [address] Phone: [phone]   Fax:  [phone]  Name: Cristina CorpusSharon Wade MRN: [id number] Date of Birth: 1970/05/01

## 2017-03-02 ENCOUNTER — Encounter: Payer: Self-pay | Admitting: Maternal Newborn

## 2017-03-02 ENCOUNTER — Ambulatory Visit: Payer: BC Managed Care – PPO | Admitting: Maternal Newborn

## 2017-03-02 VITALS — BP 116/72 | HR 72 | Ht 69.0 in | Wt 154.0 lb

## 2017-03-02 DIAGNOSIS — N390 Urinary tract infection, site not specified: Secondary | ICD-10-CM

## 2017-03-02 DIAGNOSIS — N898 Other specified noninflammatory disorders of vagina: Secondary | ICD-10-CM | POA: Diagnosis not present

## 2017-03-02 DIAGNOSIS — B3731 Acute candidiasis of vulva and vagina: Secondary | ICD-10-CM

## 2017-03-02 DIAGNOSIS — R300 Dysuria: Secondary | ICD-10-CM

## 2017-03-02 DIAGNOSIS — B373 Candidiasis of vulva and vagina: Secondary | ICD-10-CM | POA: Diagnosis not present

## 2017-03-02 LAB — POCT URINALYSIS DIPSTICK
BILIRUBIN UA: NEGATIVE
Glucose, UA: NEGATIVE
KETONES UA: NEGATIVE
Nitrite, UA: POSITIVE
PH UA: 6 (ref 5.0–8.0)
PROTEIN UA: NEGATIVE
Urobilinogen, UA: NEGATIVE E.U./dL — AB

## 2017-03-02 MED ORDER — FLUCONAZOLE 150 MG PO TABS: 150.0000 mg | ORAL_TABLET | Freq: Once | ORAL | 0 refills | Status: AC

## 2017-03-02 MED ORDER — CIPROFLOXACIN HCL 250 MG PO TABS: 250.0000 mg | ORAL_TABLET | Freq: Two times a day (BID) | ORAL | 0 refills | Status: AC

## 2017-03-02 NOTE — Progress Notes (Signed)
Obstetrics & Gynecology Office Visit   Chief Complaint:  Chief Complaint  Patient presents with  . Gynecologic Exam    irritation, itching, cloudy urine and odor    History of Present Illness: Patient has noticed that her urine is cloudy and has a bad odor. She notes some irritation when she voids. She also has vulvar and vaginal itching. All of these symptoms have been present for about five days. She has not tried anything over the counter for symptomatic relief. She has not changed personal hygiene products or taken antibiotics.   Review of Systems: 10 point review of systems negative unless otherwise noted in HPI.  Past Medical History:  Past Medical History:  Diagnosis Date  . Asthma   . Chronic pelvic pain in female 2018  . Cystitis   . Cystocele   . Endometrial polyp   . Endometriosis   . Frequent headaches   . Gastritis   . GERD (gastroesophageal reflux disease)    rare  . Gross hematuria   . History of kidney stones   . Microscopic hematuria   . Migraine   . Migraines   . Osteopenia   . Urinary disorder   . UTI (lower urinary tract infection)     Past Surgical History:  Past Surgical History:  Procedure Laterality Date  . COLONOSCOPY  11/2016   Dr. Mechele CollinElliott  . COLONOSCOPY N/A 11/05/2014   [facility]lliott, Robert [facility]  . CYSTOSCOPY  2007   with biopsy   . CYSTOSCOPY N/A 06/01/2016   Performed by Vena AustriaStaebler, Andreas, MD [facility]  . DIAGNOSTIC LAPAROSCOPY    . DILATATION & CURETTAGE/HYSTEROSCOPY WITH MYOSURE N/A 06/01/2016   Performed by Vena AustriaStaebler, Andreas, [facility]  . DILATATION & CURETTAGE/HYSTEROSCOPY WITH MYOSURE/POLYPECTOMY N/A 04/13/2015   Performed by Ward, Elenora Fenderhelsea C, MD [facility]  . GUM SURGERY    . laproscopy  2007  . POLYPECTOMY     endometrial    Gynecologic History: Patient's last menstrual period was 01/05/2017.  Obstetric History: G0P0000  Family History:  Family History  Problem Relation Age of  Onset  . Colon cancer Father 2650  . Arthritis Father   . Hyperlipidemia Father   . Transient ischemic attack Father   . Cancer - Colon Father 5855  . Prostate cancer Father 7768  . Arthritis Mother   . Heart disease Mother   . Stroke Mother        TIA  . Hypertension Mother   . Diabetes Mother   . Kidney cancer Mother 2645  . Cancer Maternal Grandmother   . Cancer Paternal Grandfather        prostate  . Breast cancer Cousin [phone]    Social History:  Social History   Socioeconomic History  . Marital status: Single    Spouse name: Not on file  . Number of children: Not on file  . Years of education: Not on file  . Highest education level: Not on file  Social Needs  . Financial resource strain: Not on file  . Food insecurity - worry: Not on file  . Food insecurity - inability: Not on file  . Transportation needs - medical: Not on file  . Transportation needs - non-medical: Not on file  Occupational History  . Not on file  Tobacco Use  . Smoking status: Never Smoker  . Smokeless tobacco: Never Used  Substance and Sexual Activity  . Alcohol use: No  Alcohol/week: 0.0 oz  . Drug use: No  . Sexual activity: No    Birth control/protection: Pill  Other Topics Concern  . Not on file  Social History Narrative   Lives with twin sister   Work- [employer]    No pets    No children    Right handed    No caffeine daily- tea occasionally; eats chocolate    Enjoys shopping, resting, [place].     Allergies:  Allergies  Allergen Reactions  . Lac Bovis Other (See Comments)  . Milk-Related Compounds   . Omeprazole Other (See Comments)    Pt reports "Chest pain, HA and Dysuria."  . Penicillins Itching and Rash    Medications: Prior to Admission medications   Medication Sig Start Date End Date Taking? Authorizing Provider  Calcium Carbonate-Vit D-Min (CALCIUM 1200 PO) Take 2-3 tablets by mouth daily. Reported on 05/26/2015   Yes [provider]  docusate sodium (COLACE) 100 MG capsule Take 1 capsule 2 (two) times daily by mouth.   Yes [provider]  levonorgestrel-ethinyl estradiol (JOLESSA) 0.15-0.03 MG tablet Take 1 tablet daily by mouth. 02/20/17  Yes Copland, Ilona SorrelAlicia B, PA-C  ranitidine (ZANTAC) 150 MG tablet Take 150 mg 2 (two) times daily by mouth.   Yes [provider]    Physical Exam Vitals:  Vitals:   03/02/17 1503  BP: 116/72  Pulse: 72   Patient's last menstrual period was 01/05/2017.  General: NAD HEENT: normocephalic, anicteric Pulmonary: No increased work of breathing Abdomen: NABS, soft, non-tender, non-distended.  Umbilicus without lesions.  No CVAT. Genitourinary:  External: Normal external female genitalia.  Normal urethral meatus, normal  Bartholin's and Skene's glands.    Vagina: Normal vaginal mucosa, no evidence of prolapse.    Rectal: deferred  Lymphatic: no evidence of inguinal lymphadenopathy Extremities: no edema, erythema, or tenderness Neurologic: Grossly intact Psychiatric: mood appropriate, affect full  Wet mount showed no clue cells, negative whiff, pH<3, yeast present, trichomonads absent.  Assessment: 46 y.o. G0P0000 with urinary symptoms and vulvar and vaginal itching.  Plan: Problem List Items Addressed This Visit    None    Visit Diagnoses    Dysuria    -  Primary   Relevant Orders   POCT urinalysis dipstick (Completed)   Urine Culture   Vaginal itching       Relevant Orders   POCT Wet Prep (Wet Mount)   NuSwab BV and Candida, NAA   Vulvovaginal candidiasis              fluconazole (DIFLUCAN) 150 MG tablet   Urinary tract infection without hematuria, site unspecified       Relevant Medications   ciprofloxacin (CIPRO) 250 MG tablet     1) Sent Rx for fluconazole and sent NuSwab to ensure yeast species other than C. albicans are not present.  2) Sent ciprofloxican Rx for urinary tract infection as patient is allergic to penicillins.  Also sent urine culture to ensure correct coverage for UTI.  3) Return to office for worsening symptoms or if not improved by medication therapy.  Marcelyn BruinsJacelyn Milen Lengacher, CNM 03/04/2017  6:50 PM

## 2017-03-04 LAB — POCT WET PREP (WET MOUNT)
Clue Cells Wet Prep Whiff POC: NEGATIVE
TRICHOMONAS WET PREP HPF POC: ABSENT

## 2017-03-05 LAB — URINE CULTURE

## 2017-03-06 ENCOUNTER — Ambulatory Visit: Payer: BC Managed Care – PPO

## 2017-03-06 DIAGNOSIS — M545 Low back pain: Secondary | ICD-10-CM | POA: Diagnosis not present

## 2017-03-06 DIAGNOSIS — G8929 Other chronic pain: Secondary | ICD-10-CM

## 2017-03-06 LAB — NUSWAB BV AND CANDIDA, NAA
Candida albicans, NAA: NEGATIVE
Candida glabrata, NAA: NEGATIVE

## 2017-03-06 NOTE — Therapy (Signed)
[place] [facility] PHYSICAL AND SPORTS MEDICINE [address], [place], [address] Phone: [phone]   Fax:  [phone]  Physical Therapy Treatment  Patient Details  Name: Cristina Wade MRN: [id number] Date of Birth: 1970/08/02 Referring Provider: Rennie Plowman, FNP   Encounter Date: 03/06/2017  PT End of Session - 03/06/17 1521    Visit Number  3    Number of Visits  13    Date for PT Re-Evaluation  04/12/17    PT Start Time  1521    PT Stop Time  1601    PT Time Calculation (min)  40 min    Activity Tolerance  Patient tolerated treatment well    Behavior During Therapy  [facility] for tasks assessed/performed       Past Medical History:  Diagnosis Date  . Asthma   . Chronic pelvic pain in female 2018  . Cystitis   . Cystocele   . Endometrial polyp   . Endometriosis   . Frequent headaches   . Gastritis   . GERD (gastroesophageal reflux disease)    rare  . Gross hematuria   . History of kidney stones   . Microscopic hematuria   . Migraine   . Migraines   . Osteopenia   . Urinary disorder   . UTI (lower urinary tract infection)     Past Surgical History:  Procedure Laterality Date  . COLONOSCOPY N/A 11/05/2014   Procedure: COLONOSCOPY;  Surgeon: Scot Jun, MD;  Location[facility] ENDOSCOPY;  Service: Endoscopy;  Laterality: N/A;  . COLONOSCOPY  11/2016   Dr. Mechele Collin  . CYSTOSCOPY  2007   with biopsy   . CYSTOSCOPY N/A 06/01/2016   Procedure: CYSTOSCOPY;  Surgeon: Vena Austria, MD;  Location: [facility] ORS;  Service: Gynecology;  Laterality: N/A;  . DIAGNOSTIC LAPAROSCOPY    . DILATATION & CURETTAGE/HYSTEROSCOPY WITH MYOSURE N/A 04/13/2015   Procedure: DILATATION & CURETTAGE/HYSTEROSCOPY WITH MYOSURE/POLYPECTOMY;  Surgeon: Elenora Fender Ward, MD;  Location: [facility] ORS;  Service: Gynecology;  Laterality: N/A;  . DILATATION & CURETTAGE/HYSTEROSCOPY WITH MYOSURE N/A 06/01/2016   Procedure: DILATATION & CURETTAGE/HYSTEROSCOPY WITH  MYOSURE;  Surgeon: Vena Austria, MD;  Location: [facility] ORS;  Service: Gynecology;  Laterality: N/A;  . GUM SURGERY    . laproscopy  2007  . POLYPECTOMY     endometrial    There were no vitals filed for this visit.  Subjective Assessment - 03/06/17 1522    Subjective  Back pain comes and goes but not like it has been. Pain is not as bad. 3-4/10 currently.  Feels like it's getting better.    Pertinent History  Chronic low back pain. Back hurts when she bends over which is the main problem. No pain going down her LE. Denies loss of bowel or bladder control.  Has had back pain for a while and just lived with it for at least a year. Sister states pt has poor posture.     Patient Stated Goals  Be able to do her chores without back pain.     Currently in Pain?  Yes    Pain Score  4     Pain Onset  More than a month ago                              PT Education - 03/06/17 1526    Education provided  Yes    Education Details  ther-ex, HEP  Person(s) Educated  Patient    Methods  Explanation;Demonstration;Tactile cues;Verbal cues;Handout    Comprehension  Returned demonstration;Verbalized understanding         Objectives  There-ex   Sitting on physioball                         Gentle manual perturbation from PT 1 minute x 3                          Manually resisted trunk flexion from PT 10x5 seconds for 3 sets  Seated bilateral shoulder extension isometrics 10x2 with 5 second holds     Reviewed and given as part of her HEP. Pt demonstrated and verbalized understanding. Handout provided.   Standing bilateral shoulder extension resisting red band 10x5 seconds      Standing mini squats with yellow band resisting hip abduction/ER with bilateral UE assist 10x3  Lunges 15 ft. R lateral lean during R LE lunge.   SLS on R LE with emphasis on level pelvis 10x5 seconds with bilateral UE light touch assist.   Standing hip machine: R glute max extension  plate 25 for 16X10x   Standing straight pallof press yellow band for 10x3 with 5 second holds   Improved exercise technique, movement at target joints, use of target muscles after min to mod verbal, visual, tactile cues.     Pt making good progress with decreasing back pain based on pt subjective reports. Continued working on trunk strengthening, ergonomic squats, and hip strengthening to promote ability to perform standing tasks with less back pain. Pt states back feeling good after session, no pain.     PT Long Term Goals - 02/27/17 1641      PT LONG TERM GOAL #1   Title  Patient will have a decrease in back pain to 4/10 or less at worst to promote ability to perform chores at home.     Baseline  8/10 at most for the past 2 months. 02/27/2017    Time  6    Period  Weeks    Status  New    Target Date  04/12/17      PT LONG TERM GOAL #2   Title  Patient will improve bilateral glute max strength to promote ability to perform standing tasks with less back pain.     Time  6    Period  Weeks    Status  New    Target Date  04/12/17      PT LONG TERM GOAL #3   Title  Pt will report being able to perform chores such as picking up something from the floor, cleaning the floor, and placing dishes in the dishwasher with less back pain as a demonstration of improved function.     Baseline  Back pain increases when performing chores involving bending over at home. 02/27/2017    Time  6    Period  Weeks    Status  New    Target Date  04/12/17            Plan - 03/06/17 1531    Clinical Impression Statement  Pt making good progress with decreasing back pain based on pt subjective reports. Continued working on trunk strengthening, ergonomic squats, and hip strengthening to promote ability to perform standing tasks with less back pain. Pt states back feeling good after session, no pain.     History and Personal  Factors relevant to plan of care:   Chronicity of condition, glute max  weakness      Clinical Presentation  Stable    Clinical Presentation due to:  Decreasing back pain per pt    Clinical Decision Making  Low    Rehab Potential  Good    Clinical Impairments Affecting Rehab Potential  Chronicity of condition    PT Frequency  2x / week    PT Duration  6 weeks    PT Treatment/Interventions  Therapeutic exercise;Therapeutic activities;Manual techniques;Patient/family education;Dry needling;Neuromuscular re-education;Ultrasound;Electrical Stimulation;Iontophoresis 4mg /ml Dexamethasone;Aquatic Therapy;Traction traction if appropriate    PT Next Visit Plan  core and glute max strengthening, lumbopelvic stability and control, ergonomics    Consulted and Agree with Plan of Care  Patient       Patient will benefit from skilled therapeutic intervention in order to improve the following deficits and impairments:  Pain, Postural dysfunction, Improper body mechanics, Decreased strength  Visit Diagnosis: Chronic bilateral low back pain, with sciatica presence unspecified     Problem List Patient Active Problem List   Diagnosis Date Noted  . Bilateral impacted cerumen 11/28/2016  . Chronic bilateral low back pain without sciatica 11/28/2016  . Chest tightness 06/27/2016  . Preventative health care 09/30/2015  . Encounter to establish care 09/28/2014  . Vestibular migraine 09/28/2014  . Encounter for screening colonoscopy 11/21/2013   Loralyn FreshwaterMiguel Hayla Hinger PT, DPT   03/06/2017, 4:07 PM  [place] [facility] [address] [place], [place], [address] Phone: [phone]   Fax:  [phone]  Name: Jeannette CorpusSharon Jeremiah MRN: [id number] Date of Birth: [birth year]

## 2017-03-06 NOTE — Patient Instructions (Addendum)
  Seated trunk isometrics  Sitting on a chair    Press your hands on your thighs to feel your trunk muscles work.    Hold for 5 seconds.    Repeat 10 times   Perform 3 sets daily.     Upgraded bilateral shoulder extension with band to red for her HEP.

## 2017-03-12 ENCOUNTER — Ambulatory Visit: Payer: BC Managed Care – PPO

## 2017-03-12 DIAGNOSIS — G8929 Other chronic pain: Secondary | ICD-10-CM

## 2017-03-12 DIAGNOSIS — M545 Low back pain: Secondary | ICD-10-CM | POA: Diagnosis not present

## 2017-03-12 NOTE — Therapy (Signed)
[place] [facility] [address] [place], [place], [address] Phone: [phone]   Fax:  [phone]  Physical Therapy Treatment  Patient Details  Name: Cristina CorpusSharon Daft MRN: [id number] Date of Birth: 04/05/1971 Referring Provider: Rennie PlowmanMargaret Arnett, FNP   Encounter Date: 03/12/2017  PT End of Session - 03/12/17 1607    Visit Number  4    Number of Visits  13    Date for PT Re-Evaluation  04/12/17    PT Start Time  1607    PT Stop Time  1649    PT Time Calculation (min)  42 min    Activity Tolerance  Patient tolerated treatment well    Behavior During Therapy  [facility] for tasks assessed/performed       Past Medical History:  Diagnosis Date  . Asthma   . Chronic pelvic pain in female 2018  . Cystitis   . Cystocele   . Endometrial polyp   . Endometriosis   . Frequent headaches   . Gastritis   . GERD (gastroesophageal reflux disease)    rare  . Gross hematuria   . History of kidney stones   . Microscopic hematuria   . Migraine   . Migraines   . Osteopenia   . Urinary disorder   . UTI (lower urinary tract infection)     Past Surgical History:  Procedure Laterality Date  . COLONOSCOPY N/A 11/05/2014   Procedure: COLONOSCOPY;  Surgeon: Scot Junobert T Elliott, MD;  [facility];  Service: Endoscopy;  Laterality: N/A;  . COLONOSCOPY  11/2016   Dr. Mechele CollinElliott  . CYSTOSCOPY  2007   with biopsy   . CYSTOSCOPY N/A 06/01/2016   Procedure: CYSTOSCOPY;  Surgeon: Vena AustriaAndreas Staebler, MD;  Location: [facility] ORS;  Service: Gynecology;  Laterality: N/A;  . DIAGNOSTIC LAPAROSCOPY    . DILATATION & CURETTAGE/HYSTEROSCOPY WITH MYOSURE N/A 04/13/2015   Procedure: DILATATION & CURETTAGE/HYSTEROSCOPY WITH MYOSURE/POLYPECTOMY;  Surgeon: Elenora Fenderhelsea C Ward, MD;  Location: [facility] ORS;  Service: Gynecology;  Laterality: N/A;  . DILATATION & CURETTAGE/HYSTEROSCOPY WITH MYOSURE N/A 06/01/2016   Procedure: DILATATION & CURETTAGE/HYSTEROSCOPY WITH  MYOSURE;  Surgeon: Vena AustriaAndreas Staebler, MD;  Location: [facility] OR[facility];  Service: Gynecology;  Laterality: N/A;  . GUM SURGERY    . laproscopy  2007  . POLYPECTOMY     endometrial    There were no vitals filed for this visit.  Subjective Assessment - 03/12/17 1608    Subjective  Back is doing good. Probably a 1-2/10 at the most since last time. No pain currently.     Pertinent History  Chronic low back pain. Back hurts when she bends over which is the main problem. No pain going down her LE. Denies loss of bowel or bladder control.  Has had back pain for a while and just lived with it for at least a year. Sister states pt has poor posture.     Patient Stated Goals  Be able to do her chores without back pain.     Currently in Pain?  No/denies    Pain Score  0-No pain    Pain Onset  More than a month ago                              PT Education - 03/12/17 1612    Education provided  Yes    Education Details  ther-ex    Starwood HotelsPerson(s) Educated  Patient  Methods  Explanation;Demonstration;Tactile cues;Verbal cues    Comprehension  Returned demonstration;Verbalized understanding         Objectives  Ther-ex   Sitting on physioball Gentle manual perturbation from PT 1 minute x 3  Manually resisted trunk flexion from PT 10x5 seconds for 3 sets   Standing hip machine: R glute max extension plate 25 for 16X  Prone R hip extension 10x2 to promote R glute max muscle use  Supine bridge with L hip march to promote R glute max strengthening 8x2   Standing bilateral shoulder extension at [employer]ME[employer] machine plate 5 with 2 lbs weight 10x2 with 5 second holds    Standing straight pallof press yellow band for 10x3 with 5 second holds  Standing regular pallof press 10x5 seconds each side to promote trunk strengthening  SLS on R LE with emphasis on level pelvis 10x5 seconds for 2 sets with bilateral UE light touch assist.     Standing mini squats with yellow band resisting hip abduction/ER with bilateral UE assist 10x2    Improved exercise technique, movement at target joints, use of target muscles after min to  mod verbal, visual, tactile cues.    Pt making good progress with decreasing back pain. Continued working on core and glute strengthening to help continue decreasing back pain with activities. Pt tolerated session well without aggravation of symptoms.                           PT Long Term Goals - 02/27/17 1641      PT LONG TERM GOAL #1   Title  Patient will have a decrease in back pain to 4/10 or less at worst to promote ability to perform chores at home.     Baseline  8/10 at most for the past 2 months. 02/27/2017    Time  6    Period  Weeks    Status  New    Target Date  04/12/17      PT LONG TERM GOAL #2   Title  Patient will improve bilateral glute max strength to promote ability to perform standing tasks with less back pain.     Time  6    Period  Weeks    Status  New    Target Date  04/12/17      PT LONG TERM GOAL #3   Title  Pt will report being able to perform chores such as picking up something from the floor, cleaning the floor, and placing dishes in the dishwasher with less back pain as a demonstration of improved function.     Baseline  Back pain increases when performing chores involving bending over at home. 02/27/2017    Time  6    Period  Weeks    Status  New    Target Date  04/12/17            Plan - 03/12/17 1614    Clinical Impression Statement  Pt making good progress with decreasing back pain. Continued working on core and glute strengthening to help continue decreasing back pain with activities. Pt tolerated session well without aggravation of symptoms.     History and Personal Factors relevant to plan of care:  Chronicity of condition, glute max weakness       Clinical Presentation  Stable    Clinical Presentation due to:  decreasing back  pain    Clinical Decision Making  Low    Rehab  Potential  Good    Clinical Impairments Affecting Rehab Potential  Chronicity of condition    PT Frequency  2x / week    PT Duration  6 weeks    PT Treatment/Interventions  Therapeutic exercise;Therapeutic activities;Manual techniques;Patient/family education;Dry needling;Neuromuscular re-education;Ultrasound;Electrical Stimulation;Iontophoresis 4mg /ml Dexamethasone;Aquatic Therapy;Traction traction if appropriate    PT Next Visit Plan  core and glute max strengthening, lumbopelvic stability and control, ergonomics    Consulted and Agree with Plan of Care  Patient       Patient will benefit from skilled therapeutic intervention in order to improve the following deficits and impairments:  Pain, Postural dysfunction, Improper body mechanics, Decreased strength  Visit Diagnosis: Chronic bilateral low back pain, with sciatica presence unspecified     Problem List Patient Active Problem List   Diagnosis Date Noted  . Bilateral impacted cerumen 11/28/2016  . Chronic bilateral low back pain without sciatica 11/28/2016  . Chest tightness 06/27/2016  . Preventative health care 09/30/2015  . Encounter to establish care 09/28/2014  . Vestibular migraine 09/28/2014  . Encounter for screening colonoscopy 11/21/2013   Loralyn FreshwaterMiguel Laygo PT, DPT   03/12/2017, 5:06 PM  [place] [facility] [address] [place], [place], [address] Phone: [phone]   Fax:  [phone]  Name: Cristina CorpusSharon Fells MRN: [id number] Date of Birth: [birth year]

## 2017-03-14 ENCOUNTER — Ambulatory Visit: Payer: BC Managed Care – PPO

## 2017-03-14 DIAGNOSIS — G8929 Other chronic pain: Secondary | ICD-10-CM

## 2017-03-14 DIAGNOSIS — M545 Low back pain: Principal | ICD-10-CM

## 2017-03-14 NOTE — Therapy (Signed)
[place] [facility] [address] [place], [place], [address] Phone: [phone]   Fax:  [phone]  Physical Therapy Treatment  Patient Details  Name: Cristina Wade MRN: [id number] Date of Birth: 1970/12/26 Referring Provider: Rennie PlowmanMargaret Arnett, FNP   Encounter Date: 03/14/2017  PT End of Session - 03/14/17 1558    Visit Number  5    Number of Visits  13    Date for PT Re-Evaluation  04/12/17    PT Start Time  1558    PT Stop Time  1641    PT Time Calculation (min)  43 min    Activity Tolerance  Patient tolerated treatment well    Behavior During Therapy  [facility] for tasks assessed/performed       Past Medical History:  Diagnosis Date  . Asthma   . Chronic pelvic pain in female 2018  . Cystitis   . Cystocele   . Endometrial polyp   . Endometriosis   . Frequent headaches   . Gastritis   . GERD (gastroesophageal reflux disease)    rare  . Gross hematuria   . History of kidney stones   . Microscopic hematuria   . Migraine   . Migraines   . Osteopenia   . Urinary disorder   . UTI (lower urinary tract infection)     Past Surgical History:  Procedure Laterality Date  . COLONOSCOPY N/A 11/05/2014   Procedure: COLONOSCOPY;  Surgeon: Scot Junobert T Elliott, MD;  [facility];  Service: Endoscopy;  Laterality: N/A;  . COLONOSCOPY  11/2016   Dr. Mechele CollinElliott  . CYSTOSCOPY  2007   with biopsy   . CYSTOSCOPY N/A 06/01/2016   Procedure: CYSTOSCOPY;  Surgeon: Vena AustriaAndreas Staebler, MD;  Location: [facility];  Service: Gynecology;  Laterality: N/A;  . DIAGNOSTIC LAPAROSCOPY    . DILATATION & CURETTAGE/HYSTEROSCOPY WITH MYOSURE N/A 04/13/2015   Procedure: DILATATION & CURETTAGE/HYSTEROSCOPY WITH MYOSURE/POLYPECTOMY;  Surgeon: Elenora Fenderhelsea C Ward, MD;  Location: [facility];  Service: Gynecology;  Laterality: N/A;  . DILATATION & CURETTAGE/HYSTEROSCOPY WITH MYOSURE N/A 06/01/2016   Procedure: DILATATION & CURETTAGE/HYSTEROSCOPY WITH  MYOSURE;  Surgeon: Vena AustriaAndreas Staebler, MD;  Location: [facility];  Service: Gynecology;  Laterality: N/A;  . GUM SURGERY    . laproscopy  2007  . POLYPECTOMY     endometrial    There were no vitals filed for this visit.  Subjective Assessment - 03/14/17 1558    Subjective  The low back is doing pretty good. Feels pressure upper back when she tries to be straighten a lot. Feels better when she relaxes.   Not much back pain at all today... maybe a 2/10.     Pertinent History  Chronic low back pain. Back hurts when she bends over which is the main problem. No pain going down her LE. Denies loss of bowel or bladder control.  Has had back pain for a while and just lived with it for at least a year. Sister states pt has poor posture.     Patient Stated Goals  Be able to do her chores without back pain.     Currently in Pain?  Yes    Pain Score  2     Pain Onset  More than a month ago                              PT Education - 03/14/17 1609  Education provided  Yes    Education Details  ther-ex    Person(s) Educated  Patient    Methods  Explanation;Demonstration;Tactile cues;Verbal cues    Comprehension  Returned demonstration;Verbalized understanding         Objectives  Ther-ex  Pt was recommended to ease off the upper back extension position to find a more comfortable upright posture position (not too much extension, find neutral) so it feels better for her. Pt demonstrated and verbalized understanding.   Standing low rows red band 10x5 seconds for 2 sets to promote trunk muscle use.  Sitting on physioball Gentle manual perturbation from PT 1 minute x 3  Manually resisted trunk flexion isometrics in neutral from PT 10x5 seconds for 3 sets  Prone R hip extension 10x2 to promote R glute max muscle use  Supine bridge with L hip march to promote R glute max strengthening 8x2   Standing L scapular  depression isometrics (so that L shoulder is not higher than R shoulder, and more even shoulder blade posture) 10x5 seconds for 2 sets   Standing straight pallof press yellow band for 10x3 with 5 second holds  Standing regular pallof press 10x5 seconds each side to promote trunk strengthening    Improved exercise technique, movement at target joints, use of target muscles after min to mod verbal, visual, tactile cues.      Pt making very good progress with decreasing low back pain based on pt subjective reports. Was recommended to find a middle ground/neutral position for upper back posture (not too much extension but not too much flexion to make it more comfortable for her back) as well as to find a comfortable amount of transversus muscle contraction to that her back does not feel too tight (middle ground) to promote comfort. Pt demonstrated and verbalized understanding. Continued working on core strengthening and neutral back posture to help promote ability to perform tasks with less back pain. Pt tolerated session well without aggravation of symptoms.      PT Long Term Goals - 02/27/17 1641      PT LONG TERM GOAL #1   Title  Patient will have a decrease in back pain to 4/10 or less at worst to promote ability to perform chores at home.     Baseline  8/10 at most for the past 2 months. 02/27/2017    Time  6    Period  Weeks    Status  New    Target Date  04/12/17      PT LONG TERM GOAL #2   Title  Patient will improve bilateral glute max strength to promote ability to perform standing tasks with less back pain.     Time  6    Period  Weeks    Status  New    Target Date  04/12/17      PT LONG TERM GOAL #3   Title  Pt will report being able to perform chores such as picking up something from the floor, cleaning the floor, and placing dishes in the dishwasher with less back pain as a demonstration of improved function.     Baseline  Back pain increases when performing chores  involving bending over at home. 02/27/2017    Time  6    Period  Weeks    Status  New    Target Date  04/12/17            Plan - 03/14/17 1612    Clinical Impression Statement  Pt making very good progress with decreasing low back pain based on pt subjective reports. Was recommended to find a middle ground/neutral position for upper back posture (not too much extension but not too much flexion to make it more comfortable for her back) as well as to find a comfortable amount of transversus muscle contraction to that her back does not feel too tight (middle ground) to promote comfort. Pt demonstrated and verbalized understanding. Continued working on core strengthening and neutral back posture to help promote ability to perform tasks with less back pain. Pt tolerated session well without aggravation of symptoms.    History and Personal Factors relevant to plan of care:  Chronicity of condition, glute max weakness        Clinical Presentation  Stable    Clinical Presentation due to:  decreasing low back pain based on pt subjective reports    Clinical Decision Making  Low    Rehab Potential  Good    Clinical Impairments Affecting Rehab Potential  Chronicity of condition    PT Frequency  2x / week    PT Duration  6 weeks    PT Treatment/Interventions  Therapeutic exercise;Therapeutic activities;Manual techniques;Patient/family education;Dry needling;Neuromuscular re-education;Ultrasound;Electrical Stimulation;Iontophoresis 4mg /ml Dexamethasone;Aquatic Therapy;Traction traction if appropriate    PT Next Visit Plan  core and glute max strengthening, lumbopelvic stability and control, ergonomics    Consulted and Agree with Plan of Care  Patient       Patient will benefit from skilled therapeutic intervention in order to improve the following deficits and impairments:  Pain, Postural dysfunction, Improper body mechanics, Decreased strength  Visit Diagnosis: Chronic bilateral low back pain,  with sciatica presence unspecified     Problem List Patient Active Problem List   Diagnosis Date Noted  . Bilateral impacted cerumen 11/28/2016  . Chronic bilateral low back pain without sciatica 11/28/2016  . Chest tightness 06/27/2016  . Preventative health care 09/30/2015  . Encounter to establish care 09/28/2014  . Vestibular migraine 09/28/2014  . Encounter for screening colonoscopy 11/21/2013   Loralyn FreshwaterMiguel Linetta Regner PT, DPT   03/14/2017, 7:42 PM   [facility] [address] , [place], [address] Phone: [phone]   Fax:  [phone]  Name: Cristina Wade MRN: [id number] Date of Birth: 1971-03-13

## 2017-03-20 ENCOUNTER — Ambulatory Visit: Payer: BC Managed Care – PPO | Attending: Family

## 2017-03-20 DIAGNOSIS — G8929 Other chronic pain: Secondary | ICD-10-CM | POA: Diagnosis present

## 2017-03-20 DIAGNOSIS — M545 Low back pain: Secondary | ICD-10-CM | POA: Diagnosis present

## 2017-03-20 NOTE — Therapy (Signed)
[facility] AND [facility] MEDICINE [address] [place], [place], [address] Phone: [phone]   Fax:  [phone]  Physical Therapy Treatment  Patient Details  Name: Cristina Wade MRN: [id number] Date of Birth: [birth year] Referring Provider: Rennie PlowmanMargaret Arnett, FNP   Encounter Date: 03/20/2017  PT End of Session - 03/20/17 1521    Visit Number  6    Number of Visits  13    Date for PT Re-Evaluation  04/12/17    PT Start Time  1521    PT Stop Time  1606    PT Time Calculation (min)  45 min    Activity Tolerance  Patient tolerated treatment well    Behavior During Therapy  [facility] for tasks assessed/performed       Past Medical History:  Diagnosis Date  . Asthma   . Chronic pelvic pain in female 2018  . Cystitis   . Cystocele   . Endometrial polyp   . Endometriosis   . Frequent headaches   . Gastritis   . GERD (gastroesophageal reflux disease)    rare  . Gross hematuria   . History of kidney stones   . Microscopic hematuria   . Migraine   . Migraines   . Osteopenia   . Urinary disorder   . UTI (lower urinary tract infection)     Past Surgical History:  Procedure Laterality Date  . COLONOSCOPY N/A 11/05/2014   Procedure: COLONOSCOPY;  Surgeon: Scot Junobert T Elliott, MD;  [facility];  Service: Endoscopy;  Laterality: N/A;  . COLONOSCOPY  11/2016   Dr. Mechele CollinElliott  . CYSTOSCOPY  2007   with biopsy   . CYSTOSCOPY N/A 06/01/2016   Procedure: CYSTOSCOPY;  Surgeon: Vena AustriaAndreas Staebler, MD;  Location: [facility] ORS;  Service: Gynecology;  Laterality: N/A;  . DIAGNOSTIC LAPAROSCOPY    . DILATATION & CURETTAGE/HYSTEROSCOPY WITH MYOSURE N/A 04/13/2015   Procedure: DILATATION & CURETTAGE/HYSTEROSCOPY WITH MYOSURE/POLYPECTOMY;  Surgeon: Elenora Fenderhelsea C Ward, MD;  Location: [facility] ORS;  Service: Gynecology;  Laterality: N/A;  . DILATATION & CURETTAGE/HYSTEROSCOPY WITH MYOSURE N/A 06/01/2016   Procedure: DILATATION & CURETTAGE/HYSTEROSCOPY WITH MYOSURE;   Surgeon: Vena AustriaAndreas Staebler, MD;  Location: [facility] ORS;  Service: Gynecology;  Laterality: N/A;  . GUM SURGERY    . laproscopy  2007  . POLYPECTOMY     endometrial    There were no vitals filed for this visit.  Subjective Assessment - 03/20/17 1521    Subjective  Back has not hardly hurt today. It feels good. No pain currently. Activities involving bending over is better. 1-2/10 back pain at most for the past 7 days.     Pertinent History  Chronic low back pain. Back hurts when she bends over which is the main problem. No pain going down her LE. Denies loss of bowel or bladder control.  Has had back pain for a while and just lived with it for at least a year. Sister states pt has poor posture.     Patient Stated Goals  Be able to do her chores without back pain.     Currently in Pain?  No/denies    Pain Score  0-No pain    Pain Onset  More than a month ago                              PT Education - 03/20/17 1549    Education provided  Yes    Education  Details  ther-ex, HEP, plan of care    Person(s) Educated  Patient    Methods  Explanation;Demonstration;Tactile cues;Verbal cues;Handout    Comprehension  Returned demonstration;Verbalized understanding         Objectives  Standing posture: R side bend  Pt states wanting to work on her posture  Ther-ex  Reviewed plan of care: Conintue with HEP after 03/22/17. Pt to D/C 12/20 if still feels good for her back. Pt verbalized understanding.   L shoulder adduction resisting red band to help decrease L trunk side bend and make shoulders more level (per pt request) 10x2 with 5 second holds   Standing L trunk side bend 10x2. More even shoulders observed afterwards  Long sit test: R LE longer both supine and long sit postion  Standing: R iliac crest slightly higher.  Used a heel lift on L shoe. Even iliac crest height in standing.   No problems with heel lift with standing and walking per pt.    Sitting  on physioball Gentle manual perturbation from PT 1 minute x 3 to promote trunk strengthening  Pt education on R trunk side bending and relationship with L shoulder being higher. Spine model used.   Seated manually resisted trunk flexion isometrics in neutral from PT 10x5 seconds for 3 sets  Seated manually resisted L scapular retraction targeting the lower trap muscles 10x5 seconds  Improved exercise technique, movement at target joints, use of target muscles after mod verbal, visual, tactile cues.   Pt demonstrates overall decreased back pain, improved core strength and improved ability to perform functional tasks per subjective reports. Also worked on improving posture today per pt request. Pt tolerated session well without aggravation of symptoms.    PT Long Term Goals - 02/27/17 1641      PT LONG TERM GOAL #1   Title  Patient will have a decrease in back pain to 4/10 or less at worst to promote ability to perform chores at home.     Baseline  8/10 at most for the past 2 months. 02/27/2017    Time  6    Period  Weeks    Status  New    Target Date  04/12/17      PT LONG TERM GOAL #2   Title  Patient will improve bilateral glute max strength to promote ability to perform standing tasks with less back pain.     Time  6    Period  Weeks    Status  New    Target Date  04/12/17      PT LONG TERM GOAL #3   Title  Pt will report being able to perform chores such as picking up something from the floor, cleaning the floor, and placing dishes in the dishwasher with less back pain as a demonstration of improved function.     Baseline  Back pain increases when performing chores involving bending over at home. 02/27/2017    Time  6    Period  Weeks    Status  New    Target Date  04/12/17            Plan - 03/20/17 1857    Clinical Impression Statement  Pt demonstrates overall decreased back pain, improved core strength and improved ability to perform  functional tasks per subjective reports. Also worked on improving posture today per pt request. Pt tolerated session well without aggravation of symptoms.     History and Personal Factors relevant to plan of  care:  Chronicity of condition, glute max weakness         Clinical Presentation  Stable    Clinical Presentation due to:  Good carry over of decreased back pain    Clinical Decision Making  Low    Rehab Potential  Good    Clinical Impairments Affecting Rehab Potential  Chronicity of condition    PT Frequency  2x / week    PT Duration  6 weeks    PT Treatment/Interventions  Therapeutic exercise;Therapeutic activities;Manual techniques;Patient/family education;Dry needling;Neuromuscular re-education;Ultrasound;Electrical Stimulation;Iontophoresis 4mg /ml Dexamethasone;Aquatic Therapy;Traction traction if appropriate    PT Next Visit Plan  core and glute max strengthening, lumbopelvic stability and control, ergonomics    Consulted and Agree with Plan of Care  Patient       Patient will benefit from skilled therapeutic intervention in order to improve the following deficits and impairments:  Pain, Postural dysfunction, Improper body mechanics, Decreased strength  Visit Diagnosis: Chronic bilateral low back pain, with sciatica presence unspecified     Problem List Patient Active Problem List   Diagnosis Date Noted  . Bilateral impacted cerumen 11/28/2016  . Chronic bilateral low back pain without sciatica 11/28/2016  . Chest tightness 06/27/2016  . Preventative health care 09/30/2015  . Encounter to establish care 09/28/2014  . Vestibular migraine 09/28/2014  . Encounter for screening colonoscopy 11/21/2013    Loralyn FreshwaterMiguel Reno Clasby PT, DPT   03/20/2017, 7:03 PM  [facility] [address] [place], [place], [address] Phone: [phone]   Fax:  [phone]  Name: Cristina Wade MRN: [id number] Date of Birth: Feb 04, 1971

## 2017-03-20 NOTE — Patient Instructions (Addendum)
  Standing, reach down to your left knee sideways   Repeat 10 times   Perform 3 sets daily.      Wear her heel lift 15 - 30 minutes at a time. Also to stop wearing it if it bothers your back. Stop wearing it if it bothers your foot, knee or hip.     Squeeze your left shoulder blade back and down.

## 2017-03-22 ENCOUNTER — Ambulatory Visit: Payer: BC Managed Care – PPO

## 2017-03-22 DIAGNOSIS — G8929 Other chronic pain: Secondary | ICD-10-CM

## 2017-03-22 DIAGNOSIS — M545 Low back pain: Secondary | ICD-10-CM | POA: Diagnosis not present

## 2017-03-22 NOTE — Therapy (Signed)
[place] PHYSICAL AND SPORTS MEDICINE [address], [place], [address] Phone: [phone]   Fax:  [phone]  Physical Therapy Treatment  Patient Details  Name: Cristina Wade MRN: [id number] Date of Birth: [birth year] Referring Provider: Mable Paris, FNP   Encounter Date: 03/22/2017  PT End of Session - 03/22/17 1654    Visit Number  7    Number of Visits  13    Date for PT Re-Evaluation  04/12/17    PT Start Time  8299    PT Stop Time  1735    PT Time Calculation (min)  41 min    Activity Tolerance  Patient tolerated treatment well    Behavior During Therapy  [facility] for tasks assessed/performed       Past Medical History:  Diagnosis Date  . Asthma   . Chronic pelvic pain in female 2018  . Cystitis   . Cystocele   . Endometrial polyp   . Endometriosis   . Frequent headaches   . Gastritis   . GERD (gastroesophageal reflux disease)    rare  . Gross hematuria   . History of kidney stones   . Microscopic hematuria   . Migraine   . Migraines   . Osteopenia   . Urinary disorder   . UTI (lower urinary tract infection)     Past Surgical History:  Procedure Laterality Date  . COLONOSCOPY N/A 11/05/2014   Procedure: COLONOSCOPY;  Surgeon: Manya Silvas, MD;  [facility] ENDOSCOPY;  Service: Endoscopy;  Laterality: N/A;  . COLONOSCOPY  11/2016   Dr. Vira Agar  . CYSTOSCOPY  2007   with biopsy   . CYSTOSCOPY N/A 06/01/2016   Procedure: CYSTOSCOPY;  Surgeon: Malachy Mood, MD;  Location: [facility] ORS;  Service: Gynecology;  Laterality: N/A;  . DIAGNOSTIC LAPAROSCOPY    . DILATATION & CURETTAGE/HYSTEROSCOPY WITH MYOSURE N/A 04/13/2015   Procedure: DILATATION & CURETTAGE/HYSTEROSCOPY WITH MYOSURE/POLYPECTOMY;  Surgeon: Honor Loh Ward, MD;  Location: [facility] ORS;  Service: Gynecology;  Laterality: N/A;  . DILATATION & CURETTAGE/HYSTEROSCOPY WITH MYOSURE N/A 06/01/2016   Procedure: DILATATION & CURETTAGE/HYSTEROSCOPY WITH MYOSURE;   Surgeon: Malachy Mood, MD;  Location: [facility] ORS;  Service: Gynecology;  Laterality: N/A;  . GUM SURGERY    . laproscopy  2007  . POLYPECTOMY     endometrial    There were no vitals filed for this visit.  Subjective Assessment - 03/22/17 1654    Subjective  Back is doing good. Tried the heel lift which started bothering her foot and took it off. She can deal with her leg being the way it is.  No back pain currently.  1-2/10 back pain at most for the past 7 days.  Feels like today is a good day to graduate from PT. Will try her HEP for the next 2 weeks and will call back afterwards to let us know if she still needs more PT.     Pertinent History  Chronic low back pain. Back hurts when she bends over which is the main problem. No pain going down her LE. Denies loss of bowel or bladder control.  Has had back pain for a while and just lived with it for at least a year. Sister states pt has poor posture.     Patient Stated Goals  Be able to do her chores without back pain.     Currently in Pain?  No/denies    Pain Score  0-No pain    Pain  Onset  More than a month ago         [facility] PT Assessment - 03/22/17 1702      Strength   Right Hip Extension  4-/5    Left Hip Extension  4/5                          PT Education - 03/22/17 1656    Education provided  Yes    Education Details  ther-ex    Person(s) Educated  Patient    Methods  Explanation;Demonstration;Tactile cues;Verbal cues    Comprehension  Returned demonstration;Verbalized understanding         Objectives  Ther-ex   Prone manually resisted hip extension 1x each way  Reviewed progress/current status with PT towards goals   Reviewed plan of care: Conintue with HEP after 03/22/17. Pt to call clinic and D/C on 04/05/17 if back continues to do well. Pt verbalized understanding.   Sitting on physioball Gentle manual perturbation from PT 1 minute x 3 to promote trunk  strengthening    Seated on physioball manually resisted trunk flexion isometrics in neutralfrom PT 10x10 seconds for 2 sets  Standing bilateral shoulder extension at [facility] machine plate 5 with 2 lbs 69G2 seconds    Then plate 10 for 95M8 seconds for 2 sets   Seated manually resisted L scapular retraction targeting the lower trap muscles 10x5 seconds for 3 sets  Regular pallof press yellow band 10x5 seconds each side   Improved exercise technique, movement at target joints, use of target muscles after min to mod verbal, visual, tactile cues.    Pt demonstrates significant decrease in back pain, improved ability to perform functional tasks and improved hip and core strength since initial evaluation. Pt has progressed very well with PT towards goals. Pt to continue with HEP x 2 weeks and call clinic to let us know if she feels like she needs more PT or not. Pt tolerated session well without aggravation of symptoms.         PT Long Term Goals - 03/22/17 1700      PT LONG TERM GOAL #1   Title  Patient will have a decrease in back pain to 4/10 or less at worst to promote ability to perform chores at home.     Baseline  8/10 at most for the past 2 months. 02/27/2017; 1-2/10 back pain at worst for the past 7 days (03/22/2017).     Time  6    Period  Weeks    Status  Achieved      PT LONG TERM GOAL #2   Title  Patient will improve bilateral glute max strength to promote ability to perform standing tasks with less back pain.     Time  6    Period  Weeks    Status  Partially Met    Target Date  04/12/17      PT LONG TERM GOAL #3   Title  Pt will report being able to perform chores such as picking up something from the floor, cleaning the floor, and placing dishes in the dishwasher with less back pain as a demonstration of improved function.     Baseline  Back pain increases when performing chores involving bending over at home. 02/27/2017; Better able to perform chores at home with  less back pain (03/22/2017)    Time  6    Period  Weeks    Status  Achieved  Plan - 03/22/17 1736    Clinical Impression Statement  Pt demonstrates significant decrease in back pain, improved ability to perform functional tasks and improved hip and core strength since initial evaluation. Pt has progressed very well with PT towards goals. Pt to continue with HEP x 2 weeks and call clinic to let us know if she feels like she needs more PT or not. Pt tolerated session well without aggravation of symptoms.     History and Personal Factors relevant to plan of care:  Chronicity of condition, glute weakness    Clinical Presentation  Stable    Clinical Presentation due to:  Pt demonstrates significant decrease in back pain and improve function    Clinical Decision Making  Low    Rehab Potential  Good    Clinical Impairments Affecting Rehab Potential  Chronicity of condition    PT Frequency  2x / week    PT Duration  6 weeks    PT Treatment/Interventions  Therapeutic exercise;Therapeutic activities;Manual techniques;Patient/family education;Dry needling;Neuromuscular re-education;Ultrasound;Electrical Stimulation;Iontophoresis '4mg'$ /ml Dexamethasone;Aquatic Therapy;Traction traction if appropriate    PT Next Visit Plan  core and glute max strengthening, lumbopelvic stability and control, ergonomics    Consulted and Agree with Plan of Care  Patient       Patient will benefit from skilled therapeutic intervention in order to improve the following deficits and impairments:  Pain, Postural dysfunction, Improper body mechanics, Decreased strength  Visit Diagnosis: Chronic bilateral low back pain, with sciatica presence unspecified     Problem List Patient Active Problem List   Diagnosis Date Noted  . Bilateral impacted cerumen 11/28/2016  . Chronic bilateral low back pain without sciatica 11/28/2016  . Chest tightness 06/27/2016  . Preventative health care 09/30/2015  . Encounter  to establish care 09/28/2014  . Vestibular migraine 09/28/2014  . Encounter for screening colonoscopy 11/21/2013    Joneen Boers PT, DPT   03/22/2017, 6:54 PM  [place] PHYSICAL AND SPORTS MEDICINE [address] [place], [address] Phone: [phone]   Fax:  [phone]  Name: Ziana Heyliger MRN: [id number] Date of Birth: [birth year]

## 2017-03-27 ENCOUNTER — Ambulatory Visit: Payer: BC Managed Care – PPO

## 2017-03-28 ENCOUNTER — Telehealth: Payer: Self-pay | Admitting: Family

## 2017-03-28 ENCOUNTER — Ambulatory Visit
Admission: RE | Admit: 2017-03-28 | Discharge: 2017-03-28 | Disposition: A | Discharge status: Home / Self Care | Payer: BC Managed Care – PPO | Source: Ambulatory Visit | Attending: Family | Admitting: Family

## 2017-03-28 DIAGNOSIS — Z1231 Encounter for screening mammogram for malignant neoplasm of breast: Secondary | ICD-10-CM | POA: Insufficient documentation

## 2017-03-28 NOTE — Telephone Encounter (Signed)
Copied from CRM [id number]. Topic: Quick Communication - See Telephone Encounter >> Mar 28, 2017  2:38 PM Rudi CocoLathan, Angeliz Settlemyre M, NT wrote: CRM for notification. See Telephone encounter for:   03/28/17.pt. Calling wanting to speak with someone concerning billing already spoke with billing department something that has to deal with the way appt. Was coded Appt. With Dr. Jason CoopArnett

## 2017-03-28 NOTE — Telephone Encounter (Signed)
[facility]

## 2017-03-28 NOTE — Telephone Encounter (Signed)
Please advise 

## 2017-03-29 ENCOUNTER — Ambulatory Visit: Payer: BC Managed Care – PPO

## 2017-03-30 NOTE — Telephone Encounter (Signed)
I spoke with the patient her information was sent to Billing for recoding and resubmission to her insurance carrier for labs done on 8.14.18.

## 2017-04-19 ENCOUNTER — Telehealth: Payer: Self-pay

## 2017-04-19 NOTE — Telephone Encounter (Signed)
Copied from CRM [id number]. Topic: Inquiry >> Apr 19, 2017  4:31 PM Everardo PacificMoton, Kelly, [place] wrote: Reason for CRM: Patient calling because she is still having a problem with the way her visit was put into billing. Patient stated that her insurance will not cover her bill because of this error. If someone from the office could give her a call back about this at [phone]. Patient does have the number to the billing office as well.

## 2017-06-18 ENCOUNTER — Ambulatory Visit: Payer: BC Managed Care – PPO | Admitting: Internal Medicine

## 2017-06-18 ENCOUNTER — Encounter: Payer: Self-pay | Admitting: Internal Medicine

## 2017-06-18 VITALS — BP 128/78 | HR 68 | Temp 98.1°F | Ht 69.0 in | Wt 154.6 lb

## 2017-06-18 DIAGNOSIS — J329 Chronic sinusitis, unspecified: Secondary | ICD-10-CM | POA: Diagnosis not present

## 2017-06-18 DIAGNOSIS — J069 Acute upper respiratory infection, unspecified: Secondary | ICD-10-CM

## 2017-06-18 DIAGNOSIS — H6123 Impacted cerumen, bilateral: Secondary | ICD-10-CM

## 2017-06-18 DIAGNOSIS — R6889 Other general symptoms and signs: Secondary | ICD-10-CM | POA: Diagnosis not present

## 2017-06-18 LAB — POC INFLUENZA A&B (BINAX/QUICKVUE)
INFLUENZA A, POC: NEGATIVE
INFLUENZA B, POC: NEGATIVE

## 2017-06-18 MED ORDER — AZITHROMYCIN 250 MG PO TABS: ORAL_TABLET | ORAL | 0 refills | Status: DC

## 2017-06-18 NOTE — Progress Notes (Signed)
Pre visit review using our clinic review tool, if applicable. No additional management support is needed unless otherwise documented below in the visit note. 

## 2017-06-18 NOTE — Progress Notes (Signed)
Chief Complaint  Patient presents with  . Acute Visit    flu like sx   Sick visit c/o sore throat, cough, sneezing, frontal h/a, ear pain and chest pain and cheeks hurting and body aches and nasal congestion x 1 week. She tried sudafed otc which helped but she stopped. Flu neg etoda y    Review of Systems  Constitutional: Negative for fever.  HENT: Positive for congestion, ear pain, sinus pain and sore throat.   Respiratory: Positive for cough.   Cardiovascular:       +chest pain with cough   Skin: Negative for rash.   Past Medical History:  Diagnosis Date  . Asthma   . Chronic pelvic pain in female 2018  . Cystitis   . Cystocele   . Endometrial polyp   . Endometriosis   . Frequent headaches   . Gastritis   . GERD (gastroesophageal reflux disease)    rare  . Gross hematuria   . History of kidney stones   . Microscopic hematuria   . Migraine   . Migraines   . Osteopenia   . Urinary disorder   . UTI (lower urinary tract infection)    Past Surgical History:  Procedure Laterality Date  . COLONOSCOPY N/A 11/05/2014   Procedure: COLONOSCOPY;  Surgeon: Scot Jun, MD;  Location: [facility] ENDOSCOPY;  Service: Endoscopy;  Laterality: N/A;  . COLONOSCOPY  11/2016   Dr. Mechele Collin  . CYSTOSCOPY  2007   with biopsy   . CYSTOSCOPY N/A 06/01/2016   Procedure: CYSTOSCOPY;  Surgeon: Vena Austria, MD;  Location: [facility] ORS;  Service: Gynecology;  Laterality: N/A;  . DIAGNOSTIC LAPAROSCOPY    . DILATATION & CURETTAGE/HYSTEROSCOPY WITH MYOSURE N/A 04/13/2015   Procedure: DILATATION & CURETTAGE/HYSTEROSCOPY WITH MYOSURE/POLYPECTOMY;  Surgeon: Elenora Fender Ward, MD;  Location: [facility] ORS;  Service: Gynecology;  Laterality: N/A;  . DILATATION & CURETTAGE/HYSTEROSCOPY WITH MYOSURE N/A 06/01/2016   Procedure: DILATATION & CURETTAGE/HYSTEROSCOPY WITH MYOSURE;  Surgeon: Vena Austria, MD;  Location: [facility] ORS;  Service: Gynecology;  Laterality: N/A;  . GUM SURGERY    . laproscopy  2007  .  POLYPECTOMY     endometrial   Family History  Problem Relation Age of Onset  . Colon cancer Father 34  . Arthritis Father   . Hyperlipidemia Father   . Transient ischemic attack Father   . Cancer - Colon Father 33  . Prostate cancer Father 40  . Arthritis Mother   . Heart disease Mother   . Stroke Mother        TIA  . Hypertension Mother   . Diabetes Mother   . Kidney cancer Mother 44  . Cancer Maternal Grandmother   . Cancer Paternal Grandfather        prostate  . Breast cancer Cousin 21   Social History   Socioeconomic History  . Marital status: Single    Spouse name: Not on file  . Number of children: Not on file  . Years of education: Not on file  . Highest education level: Not on file  Social Needs  . Financial resource strain: Not on file  . Food insecurity - worry: Not on file  . Food insecurity - inability: Not on file  . Transportation needs - medical: Not on file  . Transportation needs - non-medical: Not on file  Occupational History  . Not on file  Tobacco Use  . Smoking status: Never Smoker  . Smokeless tobacco: Never Used  Substance and Sexual  Activity  . Alcohol use: No    Alcohol/week: 0.0 oz  . Drug use: No  . Sexual activity: No    Birth control/protection: Pill  Other Topics Concern  . Not on file  Social History Narrative   Lives with twin sister   Work- [employer]    No pets    No children    Right handed    No caffeine daily- tea occasionally; eats chocolate    Enjoys shopping, resting, [place].    Current Meds  Medication Sig  . Calcium Carbonate-Vit D-Min (CALCIUM 1200 PO) Take 2-3 tablets by mouth daily. Reported on 05/26/2015  . docusate sodium (COLACE) 100 MG capsule Take 1 capsule 2 (two) times daily by mouth.  . levonorgestrel-ethinyl estradiol (JOLESSA) 0.15-0.03 MG tablet Take 1 tablet daily by mouth.  . ranitidine (ZANTAC) 150 MG tablet Take 150 mg 2 (two) times daily by mouth.    Allergies  Allergen Reactions  . Lac Bovis Other (See Comments)  . Milk-Related Compounds   . Omeprazole Other (See Comments)    Pt reports "Chest pain, HA and Dysuria."  . Penicillins Itching and Rash   Recent Results (from the past 2160 hour(s))  POC Influenza A&B (Binax test)     Status: None   Collection Time: 06/18/17  3:16 PM  Result Value Ref Range   Influenza A, POC Negative Negative   Influenza B, POC Negative Negative   Objective  Body mass index is 22.83 kg/m. Wt Readings from Last 3 Encounters:  06/18/17 154 lb 9.6 oz (70.1 kg)  03/02/17 154 lb (69.9 kg)  02/20/17 153 lb (69.4 kg)   Temp Readings from Last 3 Encounters:  06/18/17 98.1 F (36.7 C) (Oral)  11/28/16 98.3 F (36.8 C) (Oral)  11/07/16 97.7 F (36.5 C) (Oral)   BP Readings from Last 3 Encounters:  06/18/17 128/78  03/02/17 116/72  02/20/17 126/78   Pulse Readings from Last 3 Encounters:  06/18/17 68  03/02/17 72  11/28/16 68   O2 sat room air 98% Physical Exam  Constitutional: She is oriented to person, place, and time and well-developed, well-nourished, and in no distress. Vital signs are normal.  HENT:  Head: Normocephalic and atraumatic.  Nose: Right sinus exhibits maxillary sinus tenderness. Left sinus exhibits maxillary sinus tenderness.  Cerumen b/l ears  Mild ethomoid ttp   Eyes: Conjunctivae are normal. Pupils are equal, round, and reactive to light.  Cardiovascular: Normal rate, regular rhythm and normal heart sounds.  Pulmonary/Chest: Effort normal and breath sounds normal. She has no wheezes.  Neurological: She is alert and oriented to person, place, and time. Gait normal. Gait normal.  Skin: Skin is warm and dry.  Psychiatric: Mood, memory, affect and judgment normal.  Nursing note and vitals reviewed.   Assessment   1. Sinusitis/uri flu neg  2. Cerumen impaction  Plan  1. Supportive care  zpack f/u in 1 week if not better  2. Cleaned ears with currette today but  some residual rec use debrox drops    Provider: Dr. French Anaracy [facility]

## 2017-06-18 NOTE — Patient Instructions (Signed)
F/u 1 week if not better  Earwax Buildup, Adult The ears produce a substance called earwax that helps keep bacteria out of the ear and protects the skin in the ear canal. Occasionally, earwax can build up in the ear and cause discomfort or hearing loss. What increases the risk? This condition is more likely to develop in people who:  Are female.  Are elderly.  Naturally produce more earwax.  Clean their ears often with cotton swabs.  Use earplugs often.  Use in-ear headphones often.  Wear hearing aids.  Have narrow ear canals.  Have earwax that is overly thick or sticky.  Have eczema.  Are dehydrated.  Have excess hair in the ear canal.  What are the signs or symptoms? Symptoms of this condition include:  Reduced or muffled hearing.  A feeling of fullness in the ear or feeling that the ear is plugged.  Fluid coming from the ear.  Ear pain.  Ear itch.  Ringing in the ear.  Coughing.  An obvious piece of earwax that can be seen inside the ear canal.  How is this diagnosed? This condition may be diagnosed based on:  Your symptoms.  Your medical history.  An ear exam. During the exam, your health care provider will look into your ear with an instrument called an otoscope.  You may have tests, including a hearing test. How is this treated? This condition may be treated by:  Using ear drops to soften the earwax.  Having the earwax removed by a health care provider. The health care provider may: ? Flush the ear with water. ? Use an instrument that has a loop on the end (curette). ? Use a suction device.  Surgery to remove the wax buildup. This may be done in severe cases.  Follow these instructions at home:  Take over-the-counter and prescription medicines only as told by your health care provider.  Do not put any objects, including cotton swabs, into your ear. You can clean the opening of your ear canal with a washcloth or facial tissue.  Follow  instructions from your health care provider about cleaning your ears. Do not over-clean your ears.  Drink enough fluid to keep your urine clear or pale yellow. This will help to thin the earwax.  Keep all follow-up visits as told by your health care provider. If earwax builds up in your ears often or if you use hearing aids, consider seeing your health care provider for routine, preventive ear cleanings. Ask your health care provider how often you should schedule your cleanings.  If you have hearing aids, clean them according to instructions from the manufacturer and your health care provider. Contact a health care provider if:  You have ear pain.  You develop a fever.  You have blood, pus, or other fluid coming from your ear.  You have hearing loss.  You have ringing in your ears that does not go away.  Your symptoms do not improve with treatment.  You feel like the room is spinning (vertigo). Summary  Earwax can build up in the ear and cause discomfort or hearing loss.  The most common symptoms of this condition include reduced or muffled hearing and a feeling of fullness in the ear or feeling that the ear is plugged.  This condition may be diagnosed based on your symptoms, your medical history, and an ear exam.  This condition may be treated by using ear drops to soften the earwax or by having the earwax removed  by a health care provider.  Do not put any objects, including cotton swabs, into your ear. You can clean the opening of your ear canal with a washcloth or facial tissue. This information is not intended to replace advice given to you by your health care provider. Make sure you discuss any questions you have with your health care provider. Document Released: 05/11/2004 Document Revised: 06/14/2016 Document Reviewed: 06/14/2016 Elsevier Interactive Patient Education  2018 [employer]  Sinusitis, Adult Sinusitis is soreness and inflammation of your sinuses. Sinuses  are hollow spaces in the bones around your face. Your sinuses are located:  Around your eyes.  In the middle of your forehead.  Behind your nose.  In your cheekbones.  Your sinuses and nasal passages are lined with a stringy fluid (mucus). Mucus normally drains out of your sinuses. When your nasal tissues become inflamed or swollen, the mucus can become trapped or blocked so air cannot flow through your sinuses. This allows bacteria, viruses, and funguses to grow, which leads to infection. Sinusitis can develop quickly and last for 7?10 days (acute) or for more than 12 weeks (chronic). Sinusitis often develops after a cold. What are the causes? This condition is caused by anything that creates swelling in the sinuses or stops mucus from draining, including:  Allergies.  Asthma.  Bacterial or viral infection.  Abnormally shaped bones between the nasal passages.  Nasal growths that contain mucus (nasal polyps).  Narrow sinus openings.  Pollutants, such as chemicals or irritants in the air.  A foreign object stuck in the nose.  A fungal infection. This is rare.  What increases the risk? The following factors may make you more likely to develop this condition:  Having allergies or asthma.  Having had a recent cold or respiratory tract infection.  Having structural deformities or blockages in your nose or sinuses.  Having a weak immune system.  Doing a lot of swimming or diving.  Overusing nasal sprays.  Smoking.  What are the signs or symptoms? The main symptoms of this condition are pain and a feeling of pressure around the affected sinuses. Other symptoms include:  Upper toothache.  Earache.  Headache.  Bad breath.  Decreased sense of smell and taste.  A cough that may get worse at night.  Fatigue.  Fever.  Thick drainage from your nose. The drainage is often green and it may contain pus (purulent).  Stuffy nose or congestion.  Postnasal drip.  This is when extra mucus collects in the throat or back of the nose.  Swelling and warmth over the affected sinuses.  Sore throat.  Sensitivity to light.  How is this diagnosed? This condition is diagnosed based on symptoms, a medical history, and a physical exam. To find out if your condition is acute or chronic, your health care provider may:  Look in your nose for signs of nasal polyps.  Tap over the affected sinus to check for signs of infection.  View the inside of your sinuses using an imaging device that has a light attached (endoscope).  If your health care provider suspects that you have chronic sinusitis, you may also:  Be tested for allergies.  Have a sample of mucus taken from your nose (nasal culture) and checked for bacteria.  Have a mucus sample examined to see if your sinusitis is related to an allergy.  If your sinusitis does not respond to treatment and it lasts longer than 8 weeks, you may have an MRI or CT scan  to check your sinuses. These scans also help to determine how severe your infection is. In rare cases, a bone biopsy may be done to rule out more serious types of fungal sinus disease. How is this treated? Treatment for sinusitis depends on the cause and whether your condition is chronic or acute. If a virus is causing your sinusitis, your symptoms will go away on their own within 10 days. You may be given medicines to relieve your symptoms, including:  Topical nasal decongestants. They shrink swollen nasal passages and let mucus drain from your sinuses.  Antihistamines. These drugs block inflammation that is triggered by allergies. This can help to ease swelling in your nose and sinuses.  Topical nasal corticosteroids. These are nasal sprays that ease inflammation and swelling in your nose and sinuses.  Nasal saline washes. These rinses can help to get rid of thick mucus in your nose.  If your condition is caused by bacteria, you will be given an  antibiotic medicine. If your condition is caused by a fungus, you will be given an antifungal medicine. Surgery may be needed to correct underlying conditions, such as narrow nasal passages. Surgery may also be needed to remove polyps. Follow these instructions at home: Medicines  Take, use, or apply over-the-counter and prescription medicines only as told by your health care provider. These may include nasal sprays.  If you were prescribed an antibiotic medicine, take it as told by your health care provider. Do not stop taking the antibiotic even if you start to feel better. Hydrate and Humidify  Drink enough water to keep your urine clear or pale yellow. Staying hydrated will help to thin your mucus.  Use a cool mist humidifier to keep the humidity level in your home above 50%.  Inhale steam for 10-15 minutes, 3-4 times a day or as told by your health care provider. You can do this in the bathroom while a hot shower is running.  Limit your exposure to cool or dry air. Rest  Rest as much as possible.  Sleep with your head raised (elevated).  Make sure to get enough sleep each night. General instructions  Apply a warm, moist washcloth to your face 3-4 times a day or as told by your health care provider. This will help with discomfort.  Wash your hands often with soap and water to reduce your exposure to viruses and other germs. If soap and water are not available, use hand sanitizer.  Do not smoke. Avoid being around people who are smoking (secondhand smoke).  Keep all follow-up visits as told by your health care provider. This is important. Contact a health care provider if:  You have a fever.  Your symptoms get worse.  Your symptoms do not improve within 10 days. Get help right away if:  You have a severe headache.  You have persistent vomiting.  You have pain or swelling around your face or eyes.  You have vision problems.  You develop confusion.  Your neck is  stiff.  You have trouble breathing. This information is not intended to replace advice given to you by your health care provider. Make sure you discuss any questions you have with your health care provider. Document Released: 04/03/2005 Document Revised: 11/28/2015 Document Reviewed: 01/27/2015 Elsevier Interactive Patient Education  [employer].

## 2017-06-22 ENCOUNTER — Telehealth: Payer: Self-pay | Admitting: Internal Medicine

## 2017-06-22 NOTE — Telephone Encounter (Signed)
Spoke to patient she states she is feeling better . She states she doesn't have fever , does have some sinus drainage.   Advised to continue to push fluids.   Per Dr Kathlyn SacramentoMcLeans notes if symptoms worsens to follow up. Patient will call back is she doesn't improve.

## 2017-06-22 NOTE — Telephone Encounter (Signed)
Copied from CRM [phone]. Topic: Inquiry >> Jun 22, 2017  8:03 AM Cristina Wade, Kristie S wrote: Reason for CRM: Pt called in stating she was advised to call by the end of the week if still having sx. She is still hurting above her eyes and congestion in her chest. She takes the last dose of her zpack tonight. She is asking what else can be done. Please advise.

## 2017-06-22 NOTE — Telephone Encounter (Signed)
Please advise 

## 2017-06-22 NOTE — Telephone Encounter (Signed)
noted 

## 2017-06-22 NOTE — Telephone Encounter (Signed)
Please call pt and discuss congesiton in more detail  Is she getting better? Any fever?  I would not advise an abx on top of an abx as that can lead to diarrheal infections and resistance  If she is moving in the right direction, would she be amenable ot mucinex to het congestion moving ( plenty of water). That is the best medicine to get the congestion moving.   If she would like a short course of prednisone, we can try that but I would prefer time and mucinex.   You can offer reevalution with me as well in OV.

## 2017-10-09 ENCOUNTER — Telehealth: Payer: Self-pay

## 2017-10-09 NOTE — Telephone Encounter (Signed)
Pt c/o pelvic pain, ovaries hurt, pain comes and goes.  Should she be seen or not? [phone]  Adv to be seen.  [facility]

## 2017-10-09 NOTE — Telephone Encounter (Signed)
Patient is schedule at 10 am with ABC on 10/09/17

## 2017-10-10 ENCOUNTER — Other Ambulatory Visit: Payer: BC Managed Care – PPO

## 2017-10-10 ENCOUNTER — Encounter: Payer: Self-pay | Admitting: Obstetrics and Gynecology

## 2017-10-10 ENCOUNTER — Ambulatory Visit: Payer: BC Managed Care – PPO | Admitting: Obstetrics and Gynecology

## 2017-10-10 VITALS — BP 120/80 | Ht 70.0 in | Wt 152.0 lb

## 2017-10-10 DIAGNOSIS — K5901 Slow transit constipation: Secondary | ICD-10-CM | POA: Diagnosis not present

## 2017-10-10 DIAGNOSIS — R102 Pelvic and perineal pain: Secondary | ICD-10-CM

## 2017-10-10 LAB — POCT URINALYSIS DIPSTICK
Bilirubin, UA: NEGATIVE
Blood, UA: NEGATIVE
Glucose, UA: NEGATIVE
Ketones, UA: NEGATIVE
Leukocytes, UA: NEGATIVE
NITRITE UA: NEGATIVE
PH UA: 8 (ref 5.0–8.0)
PROTEIN UA: NEGATIVE
Spec Grav, UA: 1.01 (ref 1.010–1.025)

## 2017-10-10 NOTE — Patient Instructions (Signed)
I value your feedback and entrusting us with your care. If you get a [facility] patient survey, I would appreciate you taking the time to let us know about your experience today. Thank you! 

## 2017-10-10 NOTE — Progress Notes (Signed)
[name], FNP   Chief Complaint  Patient presents with  . Pelvic Pain    for 1 week     HPI:      [name] is a 47 y.o. G0P0000 who LMP was Patient's last menstrual period was 09/11/2017., presents today for pelvic pain/LLQ pain for the past wk. Pain is achy and intermittent. Has not taken any meds for sx. Hx of same sx off and on for the past few yrs. Has had neg GYN eval in the past and thought to be GI/constipation related. Pt took colace for good BM this AM, but sx have persisted since. Usually has BM Q3 days. No vag sx, no urin sx except frequency with little urine output occas. No LBP, fevers. Is on cont dosing of OCPs and doing well.   Past Medical History:  Diagnosis Date  . Asthma   . Chronic pelvic pain in female 2018  . Cystitis   . Cystocele   . Endometrial polyp   . Endometriosis    per pt report but never seen during surg  . Frequent headaches   . Gastritis   . GERD (gastroesophageal reflux disease)    rare  . Gross hematuria   . History of kidney stones   . Microscopic hematuria   . Migraine   . Migraines   . Osteopenia   . Urinary disorder   . UTI (lower urinary tract infection)     Past Surgical History:  Procedure Laterality Date  . COLONOSCOPY N/A 11/05/2014   Procedure: COLONOSCOPY;  Surgeon: Scot Jun, MD;  Location: [facility] ENDOSCOPY;  Service: Endoscopy;  Laterality: N/A;  . COLONOSCOPY  11/2016   Dr. Mechele Collin  . CYSTOSCOPY  2007   with biopsy   . CYSTOSCOPY N/A 06/01/2016   Procedure: CYSTOSCOPY;  Surgeon: Vena Austria, MD;  Location: [facility] ORS;  Service: Gynecology;  Laterality: N/A;  . DIAGNOSTIC LAPAROSCOPY    . DILATATION & CURETTAGE/HYSTEROSCOPY WITH MYOSURE N/A 04/13/2015   Procedure: DILATATION & CURETTAGE/HYSTEROSCOPY WITH MYOSURE/POLYPECTOMY;  Surgeon: Elenora Fender Ward, MD;  Location: [facility] ORS;  Service: Gynecology;  Laterality: N/A;  . DILATATION & CURETTAGE/HYSTEROSCOPY WITH MYOSURE N/A 06/01/2016   Procedure: DILATATION & CURETTAGE/HYSTEROSCOPY WITH MYOSURE;  Surgeon: Vena Austria, MD;  Location: [facility] ORS;  Service: Gynecology;  Laterality: N/A;  . GUM SURGERY    . laproscopy  2007  . POLYPECTOMY     endometrial    Family History  Problem Relation Age of Onset  . Colon cancer Father 1  . Arthritis Father   . Hyperlipidemia Father   . Transient ischemic attack Father   . Cancer - Colon Father 29  . Prostate cancer Father 2  . Arthritis Mother   . Heart disease Mother   . Stroke Mother        TIA  . Hypertension Mother   . Diabetes Mother   . Kidney cancer Mother 25  . Cancer Maternal Grandmother   . Cancer Paternal Grandfather        prostate  . Breast cancer Cousin 44    Social History   Socioeconomic History  . Marital status: Single    Spouse name: Not on file  . Number of children: Not on file  . Years of education: Not on file  . Highest education level: Not on file  Occupational History  . Not on file  Social Needs  . Financial resource strain: Not on file  . Food insecurity:  Worry: Not on file    Inability: Not on file  . Transportation needs:    Medical: Not on file    Non-medical: Not on file  Tobacco Use  . Smoking status: Never Smoker  . Smokeless tobacco: Never Used  Substance and Sexual Activity  . Alcohol use: No    Alcohol/week: 0.0 oz  . Drug use: No  . Sexual activity: Never    Birth control/protection: Pill  Lifestyle  . Physical activity:    Days per week: Not on file    Minutes per session: Not on file  . Stress: Not on file  Relationships  . Social connections:    Talks on phone: Not on file    Gets together: Not on file    Attends religious service: Not on file    Active member of club or organization: Not on file    Attends meetings of clubs or organizations: Not on file    Relationship status: Not on file  . Intimate partner violence:    Fear of current or ex partner: Not on file    Emotionally abused: Not on  file    Physically abused: Not on file    Forced sexual activity: Not on file  Other Topics Concern  . Not on file  Social History Narrative   Lives with twin sister   Work- [employer]    No pets    No children    Right handed    No caffeine daily- tea occasionally; eats chocolate    Enjoys shopping, resting, [place].     Outpatient Medications Prior to Visit  Medication Sig Dispense Refill  . levonorgestrel-ethinyl estradiol (JOLESSA) 0.15-0.03 MG tablet Take 1 tablet daily by mouth. 1 Package 4  . azithromycin (ZITHROMAX) 250 MG tablet 2 pills day 1 and pill 1 day 2-5 (Patient not taking: Reported on 10/10/2017) 6 tablet 0  . Calcium Carbonate-Vit D-Min (CALCIUM 1200 PO) Take 2-3 tablets by mouth daily. Reported on 05/26/2015    . docusate sodium (COLACE) 100 MG capsule Take 1 capsule 2 (two) times daily by mouth.    . ranitidine (ZANTAC) 150 MG tablet Take 150 mg 2 (two) times daily by mouth.     No facility-administered medications prior to visit.       ROS:  Review of Systems  Constitutional: Negative for fever.  Gastrointestinal: Negative for blood in stool, constipation, diarrhea, nausea and vomiting.  Genitourinary: Positive for pelvic pain. Negative for dyspareunia, dysuria, flank pain, frequency, hematuria, urgency, vaginal bleeding, vaginal discharge and vaginal pain.  Musculoskeletal: Negative for back pain.  Skin: Negative for rash.   BREAST: No symptoms   OBJECTIVE:   Vitals:  BP 120/80   Ht 5\' 10"  (1.778 m)   Wt 152 lb (68.9 kg)   LMP 09/11/2017   BMI 21.81 kg/m   Physical Exam  Constitutional: She is oriented to person, place, and time. Vital signs are normal. She appears well-developed.  Pulmonary/Chest: Effort normal.  Abdominal: Soft. There is no tenderness. There is no rigidity and no guarding.  Genitourinary: Vagina normal and uterus normal. There is no rash, tenderness or lesion on the right labia. There  is no rash, tenderness or lesion on the left labia. Uterus is not enlarged and not tender. Cervix exhibits no motion tenderness. Right adnexum displays no mass and no tenderness. Left adnexum displays no mass and no tenderness. No erythema or tenderness in the vagina. No vaginal discharge  found.  Musculoskeletal: Normal range of motion.  Neurological: She is alert and oriented to person, place, and time.  Psychiatric: She has a normal mood and affect. Her behavior is normal. Thought content normal.  Vitals reviewed.   Results: Results for orders placed or performed in visit on 10/10/17 (from the past 24 hour(s))  POCT Urinalysis Dipstick     Status: Normal   Collection Time: 10/10/17 10:43 AM  Result Value Ref Range   Color, UA yellow    Clarity, UA clear    Glucose, UA Negative Negative   Bilirubin, UA neg    Ketones, UA neg    Spec Grav, UA 1.010 1.010 - 1.025   Blood, UA neg    pH, UA 8.0 5.0 - 8.0   Protein, UA Negative Negative   Urobilinogen, UA  0.2 or 1.0 E.U./dL   Nitrite, UA neg    Leukocytes, UA Negative Negative   Appearance     Odor       Assessment/Plan: Pelvic pain - Neg dip/neg exam. Most likely not GYN related. Pt would like GYN u/s. Will call with results. If neg, see if sx improve with regular BMs. - Plan: US PELVIS TRANSVANGINAL NON-OB (TV ONLY), POCT Urinalysis Dipstick  Slow transit constipation - Do colace daily. F/u prn.    Return in about 1 day (around 10/11/2017) for GYN u/s for pelvic pain--ABC to call pt.  Aritza Brunet B. Porfiria Heinrich, PA-C 10/10/2017 10:45 AM

## 2017-10-12 ENCOUNTER — Ambulatory Visit (INDEPENDENT_AMBULATORY_CARE_PROVIDER_SITE_OTHER): Payer: BC Managed Care – PPO

## 2017-10-12 DIAGNOSIS — R102 Pelvic and perineal pain: Secondary | ICD-10-CM | POA: Diagnosis not present

## 2017-10-13 ENCOUNTER — Telehealth: Payer: Self-pay | Admitting: Obstetrics and Gynecology

## 2017-10-13 NOTE — Telephone Encounter (Signed)
Pt aware of neg u/s. Sx improved. Cont colace. Question GI. F/u prn

## 2017-10-17 ENCOUNTER — Other Ambulatory Visit: Payer: BC Managed Care – PPO

## 2017-11-21 ENCOUNTER — Telehealth: Payer: Self-pay | Admitting: Family

## 2017-11-21 NOTE — Telephone Encounter (Signed)
Copied from CRM [phone]. Topic: General - Other >> Nov 21, 2017  9:57 AM Marylen PontoMcneil, Ja-Kwan wrote: Reason for CRM: Pt states she was told labs would be ordered but she has not heard from anyone. Cb# [phone]

## 2018-01-15 ENCOUNTER — Other Ambulatory Visit: Payer: Self-pay | Admitting: Obstetrics and Gynecology

## 2018-01-15 DIAGNOSIS — Z1231 Encounter for screening mammogram for malignant neoplasm of breast: Secondary | ICD-10-CM

## 2018-01-30 ENCOUNTER — Encounter: Payer: Self-pay | Admitting: Obstetrics and Gynecology

## 2018-01-30 ENCOUNTER — Ambulatory Visit (INDEPENDENT_AMBULATORY_CARE_PROVIDER_SITE_OTHER): Payer: BC Managed Care – PPO | Admitting: Obstetrics and Gynecology

## 2018-01-30 VITALS — BP 130/80 | HR 67 | Ht 70.0 in | Wt 150.0 lb

## 2018-01-30 DIAGNOSIS — N3001 Acute cystitis with hematuria: Secondary | ICD-10-CM

## 2018-01-30 DIAGNOSIS — R102 Pelvic and perineal pain: Secondary | ICD-10-CM

## 2018-01-30 DIAGNOSIS — N921 Excessive and frequent menstruation with irregular cycle: Secondary | ICD-10-CM | POA: Diagnosis not present

## 2018-01-30 LAB — POCT URINALYSIS DIPSTICK
Bilirubin, UA: NEGATIVE
GLUCOSE UA: NEGATIVE
Ketones, UA: NEGATIVE
NITRITE UA: NEGATIVE
Protein, UA: NEGATIVE
SPEC GRAV UA: 1.01 (ref 1.010–1.025)
pH, UA: 7 (ref 5.0–8.0)

## 2018-01-30 MED ORDER — NITROFURANTOIN MONOHYD MACRO 100 MG PO CAPS: 100.0000 mg | ORAL_CAPSULE | Freq: Two times a day (BID) | ORAL | 0 refills | Status: AC

## 2018-01-30 NOTE — Progress Notes (Signed)
[name], FNP   Chief Complaint  Patient presents with  . Pelvic Pain    vaginal bleeding since saturday (on/off), entire pelvic pain, pt would like to be checked for UTI back pain and little odor, no burning sensation or frequency x 1 week    HPI:      [name] is a 47 y.o. G0P0000 who LMP was No LMP recorded. (Menstrual status: Oral contraceptives)., presents today for pelvic pain for the past 3 days. Pain is achy, intermittent and more suprapubic. Pt also with LBP and urine odor for past wk. No urgency/frequency/dysuria but has noted hematuria. Pt also with vaginal bleeding, however, for past 5 days. Pt is on Q3 mo OCPs but missed a pill last wk. Bleeding is light and spotting. No recent sex activity. No vag sx. Hx of UTIs in past.  Pt also with hx of constipation. Had BM yesterday but doesn't remember one prior to that. Pelvic pain not affected by BM. Took colace after BM. I had suggested to take daily as preventive but pt takes prn constipation. Had neg GYN u/s for pelvic pain 10/10/17. Hx of CPP in past and seen by Dr. Bonney Aid. He recommended pelvic PT. No endometriosis seen on dx lap in past.  Past Medical History:  Diagnosis Date  . Asthma   . Chronic pelvic pain in female 2018  . Cystitis   . Cystocele   . Endometrial polyp   . Endometriosis    per pt report but never seen during surg  . Frequent headaches   . Gastritis   . GERD (gastroesophageal reflux disease)    rare  . Gross hematuria   . History of kidney stones   . Microscopic hematuria   . Migraine   . Migraines   . Osteopenia   . Urinary disorder   . UTI (lower urinary tract infection)     Past Surgical History:  Procedure Laterality Date  . COLONOSCOPY N/A 11/05/2014   Procedure: COLONOSCOPY;  Surgeon: Scot Jun, MD;  Location: [facility] ENDOSCOPY;  Service: Endoscopy;  Laterality: N/A;  . COLONOSCOPY  11/2016   Dr. Mechele Collin  . CYSTOSCOPY  2007   with biopsy   . CYSTOSCOPY N/A  06/01/2016   Procedure: CYSTOSCOPY;  Surgeon: Vena Austria, MD;  Location: [facility] ORS;  Service: Gynecology;  Laterality: N/A;  . DIAGNOSTIC LAPAROSCOPY    . DILATATION & CURETTAGE/HYSTEROSCOPY WITH MYOSURE N/A 04/13/2015   Procedure: DILATATION & CURETTAGE/HYSTEROSCOPY WITH MYOSURE/POLYPECTOMY;  Surgeon: Elenora Fender Ward, MD;  Location: [facility] ORS;  Service: Gynecology;  Laterality: N/A;  . DILATATION & CURETTAGE/HYSTEROSCOPY WITH MYOSURE N/A 06/01/2016   Procedure: DILATATION & CURETTAGE/HYSTEROSCOPY WITH MYOSURE;  Surgeon: Vena Austria, MD;  Location: [facility] ORS;  Service: Gynecology;  Laterality: N/A;  . GUM SURGERY    . laproscopy  2007  . POLYPECTOMY     endometrial    Family History  Problem Relation Age of Onset  . Colon cancer Father 39  . Arthritis Father   . Hyperlipidemia Father   . Transient ischemic attack Father   . Cancer - Colon Father 40  . Prostate cancer Father 90  . Arthritis Mother   . Heart disease Mother   . Stroke Mother        TIA  . Hypertension Mother   . Diabetes Mother   . Kidney cancer Mother 54  . Cancer Maternal Grandmother   . Cancer Paternal Grandfather        prostate  .  Breast cancer Cousin 61    Social History   Socioeconomic History  . Marital status: Single    Spouse name: Not on file  . Number of children: Not on file  . Years of education: Not on file  . Highest education level: Not on file  Occupational History  . Not on file  Social Needs  . Financial resource strain: Not on file  . Food insecurity:    Worry: Not on file    Inability: Not on file  . Transportation needs:    Medical: Not on file    Non-medical: Not on file  Tobacco Use  . Smoking status: Never Smoker  . Smokeless tobacco: Never Used  Substance and Sexual Activity  . Alcohol use: No    Alcohol/week: 0.0 standard drinks  . Drug use: No  . Sexual activity: Not Currently    Birth control/protection: Pill  Lifestyle  . Physical activity:    Days per  week: Not on file    Minutes per session: Not on file  . Stress: Not on file  Relationships  . Social connections:    Talks on phone: Not on file    Gets together: Not on file    Attends religious service: Not on file    Active member of club or organization: Not on file    Attends meetings of clubs or organizations: Not on file    Relationship status: Not on file  . Intimate partner violence:    Fear of current or ex partner: Not on file    Emotionally abused: Not on file    Physically abused: Not on file    Forced sexual activity: Not on file  Other Topics Concern  . Not on file  Social History Narrative   Lives with twin sister   Work- Film/video editor [employer]    No pets    No children    Right handed    No caffeine daily- tea occasionally; eats chocolate    Enjoys shopping, resting, [place].     Outpatient Medications Prior to Visit  Medication Sig Dispense Refill  . Calcium Carbonate-Vit D-Min (CALCIUM 1200 PO) Take 2-3 tablets by mouth daily. Reported on 05/26/2015    . docusate sodium (COLACE) 100 MG capsule Take 1 capsule 2 (two) times daily by mouth.    . levonorgestrel-ethinyl estradiol (JOLESSA) 0.15-0.03 MG tablet Take 1 tablet daily by mouth. 1 Package 4  . ranitidine (ZANTAC) 150 MG tablet Take 150 mg 2 (two) times daily by mouth.    Marland Kitchen azithromycin (ZITHROMAX) 250 MG tablet 2 pills day 1 and pill 1 day 2-5 (Patient not taking: Reported on 10/10/2017) 6 tablet 0   No facility-administered medications prior to visit.       ROS:  Review of Systems  Constitutional: Negative for fatigue, fever and unexpected weight change.  Respiratory: Negative for cough, shortness of breath and wheezing.   Cardiovascular: Negative for chest pain, palpitations and leg swelling.  Gastrointestinal: Negative for blood in stool, constipation, diarrhea, nausea and vomiting.  Endocrine: Negative for cold intolerance, heat intolerance and polyuria.    Genitourinary: Positive for menstrual problem and pelvic pain. Negative for dyspareunia, dysuria, flank pain, frequency, genital sores, hematuria, urgency, vaginal bleeding, vaginal discharge and vaginal pain.  Musculoskeletal: Positive for back pain. Negative for joint swelling and myalgias.  Skin: Negative for rash.  Neurological: Negative for dizziness, syncope, light-headedness, numbness and headaches.  Hematological: Negative for adenopathy.  Psychiatric/Behavioral:  Negative for agitation, confusion, sleep disturbance and suicidal ideas. The patient is not nervous/anxious.    BREAST: No symptoms   OBJECTIVE:   Vitals:  BP 130/80   Pulse 67   Ht 5\' 10"  (1.778 m)   Wt 150 lb (68 kg)   BMI 21.52 kg/m   Physical Exam  Constitutional: She is oriented to person, place, and time. Vital signs are normal. She appears well-developed.  Neck: Normal range of motion.  Pulmonary/Chest: Effort normal.  Abdominal: Soft. There is tenderness in the suprapubic area. There is no CVA tenderness.  Genitourinary: Vagina normal and uterus normal. There is no rash, tenderness or lesion on the right labia. There is no rash, tenderness or lesion on the left labia. Uterus is not enlarged and not tender. Cervix exhibits no motion tenderness. Right adnexum displays no mass and no tenderness. Left adnexum displays no mass and no tenderness. No erythema, tenderness or bleeding in the vagina. No vaginal discharge found.  Genitourinary Comments: NO VAG BLEEDING ON EXAM; NO TENDERNESS ON EXAM  Musculoskeletal: Normal range of motion.  Neurological: She is alert and oriented to person, place, and time. No cranial nerve deficit.  Psychiatric: She has a normal mood and affect. Her behavior is normal. Judgment and thought content normal.  Vitals reviewed.   Results: Results for orders placed or performed in visit on 01/30/18 (from the past 24 hour(s))  POCT Urinalysis Dipstick     Status: Abnormal   Collection  Time: 01/30/18  4:48 PM  Result Value Ref Range   Color, UA straw    Clarity, UA cloudy    Glucose, UA Negative Negative   Bilirubin, UA neg    Ketones, UA neg    Spec Grav, UA 1.010 1.010 - 1.025   Blood, UA mod    pH, UA 7.0 5.0 - 8.0   Protein, UA Negative Negative   Urobilinogen, UA     Nitrite, UA neg    Leukocytes, UA Moderate (2+) (A) Negative   Appearance     Odor       Assessment/Plan: Acute cystitis with hematuria - Pos sx/UA. Rx macrobid. Check C&S. F/u prn sx.  - Plan: POCT Urinalysis Dipstick, Urine Culture, nitrofurantoin, macrocrystal-monohydrate, (MACROBID) 100 MG capsule  Pelvic pain - Most likely related to UTI. F/u if sx persist. Take colace daily as constipation prevention.  Breakthrough bleeding on OCPs - After missed pill. No bleeding on exam today. Reassurance. F/u prn.     Meds ordered this encounter  Medications  . nitrofurantoin, macrocrystal-monohydrate, (MACROBID) 100 MG capsule    Sig: Take 1 capsule (100 mg total) by mouth 2 (two) times daily for 5 days.    Dispense:  10 capsule    Refill:  0    Order Specific Question:   Supervising Provider    Answer:   Nadara Mustard [[id number]]      Return if symptoms worsen or fail to improve.  Peni Rupard B. Gauri Galvao, PA-C 01/30/2018 4:50 PM

## 2018-01-30 NOTE — Patient Instructions (Signed)
I value your feedback and entrusting us with your care. If you get a [facility] patient survey, I would appreciate you taking the time to let us know about your experience today. Thank you! 

## 2018-01-31 ENCOUNTER — Ambulatory Visit: Payer: BC Managed Care – PPO | Admitting: Family Medicine

## 2018-02-02 ENCOUNTER — Encounter: Payer: Self-pay | Admitting: Emergency Medicine

## 2018-02-02 ENCOUNTER — Emergency Department: Payer: BC Managed Care – PPO

## 2018-02-02 DIAGNOSIS — J45909 Unspecified asthma, uncomplicated: Secondary | ICD-10-CM | POA: Insufficient documentation

## 2018-02-02 DIAGNOSIS — R079 Chest pain, unspecified: Secondary | ICD-10-CM | POA: Diagnosis present

## 2018-02-02 LAB — CBC
HCT: 39.6 % (ref 36.0–46.0)
Hemoglobin: 13 g/dL (ref 12.0–15.0)
MCH: 29.9 pg (ref 26.0–34.0)
MCHC: 32.8 g/dL (ref 30.0–36.0)
MCV: 91 fL (ref 80.0–100.0)
PLATELETS: 244 10*3/uL (ref 150–400)
RBC: 4.35 MIL/uL (ref 3.87–5.11)
RDW: 12.5 % (ref 11.5–15.5)
WBC: 6.3 10*3/uL (ref 4.0–10.5)
nRBC: 0 % (ref 0.0–0.2)

## 2018-02-02 LAB — POCT PREGNANCY, URINE: Preg Test, Ur: NEGATIVE

## 2018-02-02 LAB — BASIC METABOLIC PANEL
ANION GAP: 8 (ref 5–15)
BUN: 10 mg/dL (ref 6–20)
CO2: 26 mmol/L (ref 22–32)
Calcium: 9.3 mg/dL (ref 8.9–10.3)
Chloride: 107 mmol/L (ref 98–111)
Creatinine, Ser: 0.74 mg/dL (ref 0.44–1.00)
Glucose, Bld: 99 mg/dL (ref 70–99)
POTASSIUM: 3.9 mmol/L (ref 3.5–5.1)
Sodium: 141 mmol/L (ref 135–145)

## 2018-02-02 LAB — URINE CULTURE

## 2018-02-02 LAB — TROPONIN I

## 2018-02-02 NOTE — ED Triage Notes (Signed)
Patient with complaint of intermittent central chest pain that started 3-4 days ago. Patient states that tonight she has has shortness of breath and nausea with it.

## 2018-02-03 ENCOUNTER — Emergency Department
Admission: EM | Admit: 2018-02-03 | Discharge: 2018-02-03 | Disposition: A | Discharge status: Home / Self Care | Payer: BC Managed Care – PPO | Attending: Emergency Medicine | Admitting: Emergency Medicine

## 2018-02-03 DIAGNOSIS — R079 Chest pain, unspecified: Secondary | ICD-10-CM

## 2018-02-03 LAB — TROPONIN I

## 2018-02-03 LAB — FIBRIN DERIVATIVES D-DIMER (ARMC ONLY): Fibrin derivatives D-dimer (ARMC): 175.93 ng/mL (FEU) (ref 0.00–499.00)

## 2018-02-03 NOTE — ED Provider Notes (Signed)
[facility] Emergency Department Provider Note   First MD Initiated Contact with Patient 02/03/18 [phone]     (approximate)  I have reviewed the triage vital signs and the nursing notes.   HISTORY  Chief Complaint Chest Pain    HPI Cristina Wade is a 47 y.o. female with below list of chronic medical conditions presents to the emergency department with a 3-day history of intermittent central chest pain and dyspnea.  Patient denies any chest pain at present.  Patient denies any lower externally pain or swelling.  Patient denies any history of DVT or PE.    Past Medical History:  Diagnosis Date  . Asthma   . Chronic pelvic pain in female 2018  . Cystitis   . Cystocele   . Endometrial polyp   . Endometriosis    per pt report but never seen during surg  . Frequent headaches   . Gastritis   . GERD (gastroesophageal reflux disease)    rare  . Gross hematuria   . History of kidney stones   . Microscopic hematuria   . Migraine   . Migraines   . Osteopenia   . Urinary disorder   . UTI (lower urinary tract infection)     Patient Active Problem List   Diagnosis Date Noted  . Pelvic pain 01/30/2018  . Slow transit constipation 10/10/2017  . Bilateral impacted cerumen 11/28/2016  . Chronic bilateral low back pain without sciatica 11/28/2016  . Chest tightness 06/27/2016  . Preventative health care 09/30/2015  . Encounter to establish care 09/28/2014  . Vestibular migraine 09/28/2014  . Encounter for screening colonoscopy 11/21/2013    Past Surgical History:  Procedure Laterality Date  . COLONOSCOPY N/A 11/05/2014   Procedure: COLONOSCOPY;  Surgeon: Scot Jun, MD;  Location: [facility] ENDOSCOPY;  Service: Endoscopy;  Laterality: N/A;  . COLONOSCOPY  11/2016   Dr. Mechele Collin  . CYSTOSCOPY  2007   with biopsy   . CYSTOSCOPY N/A 06/01/2016   Procedure: CYSTOSCOPY;  Surgeon: Vena Austria, MD;  Location: [facility] ORS;  Service: Gynecology;  Laterality:  N/A;  . DIAGNOSTIC LAPAROSCOPY    . DILATATION & CURETTAGE/HYSTEROSCOPY WITH MYOSURE N/A 04/13/2015   Procedure: DILATATION & CURETTAGE/HYSTEROSCOPY WITH MYOSURE/POLYPECTOMY;  Surgeon: Elenora Fender Ward, MD;  Location: [facility] ORS;  Service: Gynecology;  Laterality: N/A;  . DILATATION & CURETTAGE/HYSTEROSCOPY WITH MYOSURE N/A 06/01/2016   Procedure: DILATATION & CURETTAGE/HYSTEROSCOPY WITH MYOSURE;  Surgeon: Vena Austria, MD;  Location: [facility] ORS;  Service: Gynecology;  Laterality: N/A;  . GUM SURGERY    . laproscopy  2007  . POLYPECTOMY     endometrial    Prior to Admission medications   Medication Sig Start Date End Date Taking? Authorizing Provider  Calcium Carbonate-Vit D-Min (CALCIUM 1200 PO) Take 2-3 tablets by mouth daily. Reported on 05/26/2015    [provider]  docusate sodium (COLACE) 100 MG capsule Take 1 capsule 2 (two) times daily by mouth.    [provider]  levonorgestrel-ethinyl estradiol (JOLESSA) 0.15-0.03 MG tablet Take 1 tablet daily by mouth. 02/20/17   Copland, Ilona Sorrel, PA-C  nitrofurantoin, macrocrystal-monohydrate, (MACROBID) 100 MG capsule Take 1 capsule (100 mg total) by mouth 2 (two) times daily for 5 days. 01/30/18 02/04/18  Copland, Ilona Sorrel, PA-C  ranitidine (ZANTAC) 150 MG tablet Take 150 mg 2 (two) times daily by mouth.    [provider]    Allergies Lac bovis; Milk-related compounds; Omeprazole; and Penicillins  Family History  Problem Relation  Age of Onset  . Colon cancer Father 3  . Arthritis Father   . Hyperlipidemia Father   . Transient ischemic attack Father   . Cancer - Colon Father 67  . Prostate cancer Father 73  . Arthritis Mother   . Heart disease Mother   . Stroke Mother        TIA  . Hypertension Mother   . Diabetes Mother   . Kidney cancer Mother 81  . Cancer Maternal Grandmother   . Cancer Paternal Grandfather        prostate  . Breast cancer Cousin 60    Social History Social History   Tobacco  Use  . Smoking status: Never Smoker  . Smokeless tobacco: Never Used  Substance Use Topics  . Alcohol use: No    Alcohol/week: 0.0 standard drinks  . Drug use: No    Review of Systems Constitutional: No fever/chills Eyes: No visual changes. ENT: No sore throat. Cardiovascular: Positive for chest pain. Respiratory: Denies shortness of breath. Gastrointestinal: No abdominal pain.  No nausea, no vomiting.  No diarrhea.  No constipation. Genitourinary: Negative for dysuria. Musculoskeletal: Negative for neck pain.  Negative for back pain. Integumentary: Negative for rash. Neurological: Negative for headaches, focal weakness or numbness.   ____________________________________________   PHYSICAL EXAM:  VITAL SIGNS: ED Triage Vitals  Enc Vitals Group     BP 02/03/18 0019 130/67     Pulse Rate 02/03/18 0019 72     Resp 02/03/18 0019 18     Temp 02/03/18 0019 98.2 F (36.8 C)     Temp Source 02/03/18 0019 Oral     SpO2 02/03/18 0019 98 %     Weight 02/02/18 2300 68 kg (150 lb)     Height 02/02/18 2300 1.778 m (5\' 10" )     Head Circumference --      Peak Flow --      Pain Score 02/02/18 2300 8     Pain Loc --      Pain Edu? --      Excl. in GC? --     Constitutional: Alert and oriented. Well appearing and in no acute distress. Eyes: Conjunctivae are normal. Mouth/Throat: Mucous membranes are moist. Oropharynx non-erythematous. Neck: No stridor.  Cardiovascular: Normal rate, regular rhythm. Good peripheral circulation. Grossly normal heart sounds. Respiratory: Normal respiratory effort.  No retractions. Lungs CTAB. Gastrointestinal: Soft and nontender. No distention.  Musculoskeletal: No lower extremity tenderness nor edema. No gross deformities of extremities. Neurologic:  Normal speech and language. No gross focal neurologic deficits are appreciated.  Skin:  Skin is warm, dry and intact. No rash noted. Psychiatric: Mood and affect are normal. Speech and behavior are  normal.  ____________________________________________   LABS (all labs ordered are listed, but only abnormal results are displayed)  Labs Reviewed  BASIC METABOLIC PANEL  CBC  TROPONIN I  TROPONIN I  FIBRIN DERIVATIVES D-DIMER ([facility] ONLY)  POC URINE PREG, ED  POCT PREGNANCY, URINE   ____________________________________________  EKG  ED ECG REPORT I, [place] N BROWN, the attending physician, personally viewed and interpreted this ECG.   Date: 02/03/2018  EKG Time: 11:01 PM  Rate: 71  Rhythm: Normal sinus rhythm  Axis: Normal  Intervals: Normal  ST&T Change: None  ____________________________________________  RADIOLOGY I,  N BROWN, personally viewed and evaluated these images (plain radiographs) as part of my medical decision making, as well as reviewing the written report by the radiologist.  ED MD interpretation: Negative chest x-ray  per radiologist.  Official radiology report(s): Dg Chest 2 View  Result Date: 02/02/2018 CLINICAL DATA:  Chest pain for 3 days.  Shortness of breath. EXAM: CHEST - 2 VIEW COMPARISON:  12/05/2015 FINDINGS: The cardiomediastinal contours are normal. The lungs are clear. Pulmonary vasculature is normal. No consolidation, pleural effusion, or pneumothorax. No acute osseous abnormalities are seen. IMPRESSION: Negative radiographs of the chest. Electronically Signed   By: Narda Rutherford M.D.   On: 02/02/2018 23:30    ____________________  Procedures   ____________________________________________   INITIAL IMPRESSION / ASSESSMENT AND PLAN / ED COURSE  As part of my medical decision making, I reviewed the following data within the electronic MEDICAL RECORD NUMBER   48 year old female presenting with above-stated history and physical exam secondary to chest discomfort.  Considered possibly of ACS as such EKG was performed which revealed no evidence of ischemia or infarction.  Troponin negative x2.  Also considered possibility of PE  and a low risk patient as such d-dimer was obtained which was negative.  Patient has no chest pain at present.  Patient will be referred to primary care provider for further outpatient evaluation and management. ____________________________________________  FINAL CLINICAL IMPRESSION(S) / ED DIAGNOSES  Final diagnoses:  Chest pain, unspecified type     MEDICATIONS GIVEN DURING THIS VISIT:  Medications - No data to display   ED Discharge Orders    None       Note:  This document was prepared using Dragon voice recognition software and may include unintentional dictation errors.    Darci Current, MD 02/03/18 [phone]

## 2018-02-03 NOTE — ED Notes (Signed)
ED Provider at bedside. 

## 2018-02-04 ENCOUNTER — Telehealth: Payer: Self-pay

## 2018-02-04 NOTE — Telephone Encounter (Signed)
Pt states she is still having pelvic and back pain.

## 2018-02-04 NOTE — Telephone Encounter (Signed)
This encounter was created in error - please disregard.

## 2018-02-04 NOTE — Progress Notes (Signed)
Pls call pt to see how her sx are. If not completely resolved, need to add additional abx due to 2 types bacteria on C&S. Thx.

## 2018-02-04 NOTE — Progress Notes (Signed)
Called and left vm to call back

## 2018-02-05 ENCOUNTER — Other Ambulatory Visit: Payer: Self-pay | Admitting: Obstetrics and Gynecology

## 2018-02-05 MED ORDER — LEVOFLOXACIN 250 MG PO TABS: 250.0000 mg | ORAL_TABLET | Freq: Every day | ORAL | 0 refills | Status: DC

## 2018-02-05 NOTE — Telephone Encounter (Signed)
Called and left vm to return call

## 2018-02-05 NOTE — Telephone Encounter (Signed)
Pt aware.

## 2018-02-05 NOTE — Telephone Encounter (Signed)
Pls let pt know different abx eRxd. Take daily for 3 days. F/u prn. Thx.

## 2018-02-05 NOTE — Progress Notes (Signed)
Rx levaquin for GBS UTI, still sx after macrobid tx, and allergic to PCNs.

## 2018-02-07 ENCOUNTER — Encounter: Payer: Self-pay | Admitting: Urology

## 2018-02-07 ENCOUNTER — Ambulatory Visit (INDEPENDENT_AMBULATORY_CARE_PROVIDER_SITE_OTHER): Payer: BC Managed Care – PPO | Admitting: Urology

## 2018-02-07 VITALS — BP 112/72 | HR 65 | Ht 70.0 in | Wt 152.2 lb

## 2018-02-07 DIAGNOSIS — R3989 Other symptoms and signs involving the genitourinary system: Secondary | ICD-10-CM

## 2018-02-07 DIAGNOSIS — R109 Unspecified abdominal pain: Secondary | ICD-10-CM | POA: Diagnosis not present

## 2018-02-07 DIAGNOSIS — M1991 Primary osteoarthritis, unspecified site: Secondary | ICD-10-CM | POA: Insufficient documentation

## 2018-02-07 LAB — URINALYSIS, COMPLETE
Bilirubin, UA: NEGATIVE
Glucose, UA: NEGATIVE
Ketones, UA: NEGATIVE
LEUKOCYTES UA: NEGATIVE
Nitrite, UA: NEGATIVE
PH UA: 8.5 — AB (ref 5.0–7.5)
PROTEIN UA: NEGATIVE
Specific Gravity, UA: 1.015 (ref 1.005–1.030)
Urobilinogen, Ur: 0.2 mg/dL (ref 0.2–1.0)

## 2018-02-07 LAB — MICROSCOPIC EXAMINATION
BACTERIA UA: NONE SEEN
RBC MICROSCOPIC, UA: NONE SEEN /HPF (ref 0–2)

## 2018-02-07 LAB — BLADDER SCAN AMB NON-IMAGING: Scan Result: 0

## 2018-02-07 NOTE — Progress Notes (Signed)
02/07/2018 4:06 PM   Cristina Wade 01-23-1971 [id number]  Referring provider: [name], FNP [address] [place], [place] [address]  No chief complaint on file.   HPI: 47 yo WF with a history of dysfunctional voiding and chronic constipation who presents today for flank and bladder pain with her sister, Cristina Wade.    Background history She has had extensive workup in the past including urodynamics which revealed delayed first sensation at 626cc, normal bladder capacity and pressures. Mild demonstrable stress urinary incontinence was appreciated. No evidence of urgency. EMG was consistent with poor relaxation of the external sphincter consistent with pseudo-DSD associated with incomplete bladder emptying.  Shortly thereafter, she was referred to physical therapy and had 1 session. She was taught some techniques including raising her feet above the level of her bladder when voiding and some core exercises.  She tried this for approximate 2 weeks and it did not help so she stopped.  She underwent cystoscopy, excision of cervical polyp by Dr. Chauncey Cruel. 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   She was prescribed Elmiron by her PCP but has not started this medication.  She does have severe chronic constipation. She only has a bowel movement proximal leg twice a week. She does occasionally take Benefiber  otherwise no maintenance bowel medications or regimen.  Today, she is having suprapubic pain and bilateral flank pain.  10/10 pain on Monday.  2/10 pain today.  It is constant pain.  It is sharp pain.  Nothing helps or hurts her pain.  It has been going on for several weeks.  Patient denies any gross hematuria, dysuria or suprapubic/flank pain.  Patient denies any fevers, chills, nausea or vomiting. Her PVR is 0 mL.   Her UA is negative.      PMH: Past Medical History:  Diagnosis Date  . Asthma   .  Chronic pelvic pain in female 2018  . Cystitis   . Cystocele   . Endometrial polyp   . Endometriosis    per pt report but never seen during surg  . Frequent headaches   . Gastritis   . GERD (gastroesophageal reflux disease)    rare  . Gross hematuria   . History of kidney stones   . Microscopic hematuria   . Migraine   . Migraines   . Osteopenia   . Urinary disorder   . UTI (lower urinary tract infection)     Surgical History: Past Surgical History:  Procedure Laterality Date  . COLONOSCOPY N/A 11/05/2014   Procedure: COLONOSCOPY;  Surgeon: Scot Jun, MD;  Location: [facility] ENDOSCOPY;  Service: Endoscopy;  Laterality: N/A;  . COLONOSCOPY  11/2016   Dr. Mechele Collin  . CYSTOSCOPY  2007   with biopsy   . CYSTOSCOPY N/A 06/01/2016   Procedure: CYSTOSCOPY;  Surgeon: Vena Austria, MD;  Location: [facility] ORS;  Service: Gynecology;  Laterality: N/A;  . DIAGNOSTIC LAPAROSCOPY    . DILATATION & CURETTAGE/HYSTEROSCOPY WITH MYOSURE N/A 04/13/2015   Procedure: DILATATION & CURETTAGE/HYSTEROSCOPY WITH MYOSURE/POLYPECTOMY;  Surgeon: Elenora Fender Ward, MD;  Location: [facility] ORS;  Service: Gynecology;  Laterality: N/A;  . DILATATION & CURETTAGE/HYSTEROSCOPY WITH MYOSURE N/A 06/01/2016   Procedure: DILATATION & CURETTAGE/HYSTEROSCOPY WITH MYOSURE;  Surgeon: Vena Austria, MD;  Location: [facility] ORS;  Service: Gynecology;  Laterality: N/A;  . GUM SURGERY    . laproscopy  2007  . POLYPECTOMY     endometrial  Home Medications:  Allergies as of 02/07/2018      Reactions   Lac Bovis Other (See Comments)   Milk-related Compounds    Omeprazole Other (See Comments)   Pt reports "Chest pain, HA and Dysuria."   Penicillins Itching, Rash      Medication List        Accurate as of 02/07/18  4:06 PM. Always use your most recent med list.          CALCIUM 1200 PO Take 2-3 tablets by mouth daily. Reported on 05/26/2015   docusate sodium 100 MG capsule Commonly known as:  COLACE Take 1  capsule 2 (two) times daily by mouth.   levonorgestrel-ethinyl estradiol 0.15-0.03 MG tablet Commonly known as:  SEASONALE,INTROVALE,JOLESSA Take 1 tablet daily by mouth.       Allergies:  Allergies  Allergen Reactions  . Lac Bovis Other (See Comments)  . Milk-Related Compounds   . Omeprazole Other (See Comments)    Pt reports "Chest pain, HA and Dysuria."  . Penicillins Itching and Rash    Family History: Family History  Problem Relation Age of Onset  . Colon cancer Father 34  . Arthritis Father   . Hyperlipidemia Father   . Transient ischemic attack Father   . Cancer - Colon Father 56  . Prostate cancer Father 67  . Arthritis Mother   . Heart disease Mother   . Stroke Mother        TIA  . Hypertension Mother   . Diabetes Mother   . Kidney cancer Mother 46  . Cancer Maternal Grandmother   . Cancer Paternal Grandfather        prostate  . Breast cancer Cousin 53    Social History:  reports that she has never smoked. She has never used smokeless tobacco. She reports that she does not drink alcohol or use drugs.  ROS: UROLOGY Frequent Urination?: No Hard to postpone urination?: No Burning/pain with urination?: No Get up at night to urinate?: No Leakage of urine?: No Urine stream starts and stops?: No Trouble starting stream?: No Do you have to strain to urinate?: No Blood in urine?: Yes Urinary tract infection?: No Sexually transmitted disease?: No Injury to kidneys or bladder?: No Painful intercourse?: No Weak stream?: No Currently pregnant?: No Vaginal bleeding?: No Last menstrual period?: n  Gastrointestinal Nausea?: No Vomiting?: No Indigestion/heartburn?: No Constipation?: No  Constitutional Fever: No Night sweats?: No Weight loss?: No Fatigue?: No  Skin Skin rash/lesions?: No Itching?: No  Eyes Blurred vision?: No Double vision?: No  Ears/Nose/Throat Sore throat?: No Sinus problems?: No  Hematologic/Lymphatic Easy bruising?:  No  Cardiovascular Leg swelling?: No Chest pain?: No  Respiratory Cough?: No Shortness of breath?: No  Endocrine Excessive thirst?: No  Musculoskeletal Back pain?: Yes Joint pain?: No  Neurological Headaches?: No Dizziness?: No  Psychologic Anxiety?: No  Physical Exam: BP 112/72 (BP Location: Left Arm, Patient Position: Sitting, Cuff Size: Normal)   Pulse 65   Ht 5\' 10"  (1.778 m)   Wt 152 lb 3.2 oz (69 kg)   BMI 21.84 kg/m   Constitutional: Well nourished. Alert and oriented, No acute distress. HEENT: [place] AT, moist mucus membranes. Trachea midline, no masses. Cardiovascular: No clubbing, cyanosis, or edema. Respiratory: Normal respiratory effort, no increased work of breathing. GI: Abdomen is soft, tender, non distended, no abdominal masses.  Skin: No rashes, bruises or suspicious lesions. Lymph: No cervical or inguinal adenopathy. Neurologic: Grossly intact, no focal deficits, moving all 4 extremities.  Psychiatric: Normal mood and affect.   Laboratory Data: Lab Results  Component Value Date   WBC 6.3 02/02/2018   HGB 13.0 02/02/2018   HCT 39.6 02/02/2018   MCV 91.0 02/02/2018   PLT 244 02/02/2018    Lab Results  Component Value Date   CREATININE 0.74 02/02/2018   Urinalysis Negative.  See Epic.  I have reviewed the labs  Pertinent imaging Results for JAMAIYA, TUNNELL (MRN [id number]) as of 02/07/2018 16:01  Ref. Range 02/07/2018 15:25  Scan Result Unknown 0      Assessment & Plan:    1. 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 Bladder pain Possible diagnosis of IC versus exacerbation of pseudo-DSD We discussed the diease pathophysiology is poorly understood therefore treatment has been focused primarily on symptomatic relief as well as dietary and behavioral modification.  Urinalysis, Complete - will send for culture  RUS ordered RTC to  discuss results  3. Chronic constipation Doing well on bowel regimine   Return for renal ultrasound report .  Michiel Cowboy, PA-C  [facility] [address] [place], [place] [address] [phone]

## 2018-02-10 LAB — CULTURE, URINE COMPREHENSIVE

## 2018-02-11 ENCOUNTER — Telehealth: Payer: Self-pay

## 2018-02-11 DIAGNOSIS — B955 Unspecified streptococcus as the cause of diseases classified elsewhere: Secondary | ICD-10-CM

## 2018-02-11 MED ORDER — CLINDAMYCIN HCL 300 MG PO CAPS: 300.0000 mg | ORAL_CAPSULE | Freq: Three times a day (TID) | ORAL | 0 refills | Status: AC

## 2018-02-11 NOTE — Telephone Encounter (Signed)
Called pt, no answer. LM for pt informing her of the information below. RX sent in, advised pt to call back for questions or concerns.

## 2018-02-11 NOTE — Telephone Encounter (Signed)
-----   Message from Harle Battiest, PA-C sent at 02/11/2018  8:11 AM EDT ----- Please let Cristina Wade know that her urine culture was positive.  She needs to start Clindamycin 300 mg, tid for seven days.

## 2018-02-20 ENCOUNTER — Ambulatory Visit
Admission: RE | Admit: 2018-02-20 | Discharge: 2018-02-20 | Disposition: A | Discharge status: Home / Self Care | Payer: BC Managed Care – PPO | Source: Ambulatory Visit | Attending: Urology | Admitting: Urology

## 2018-02-20 DIAGNOSIS — R3989 Other symptoms and signs involving the genitourinary system: Secondary | ICD-10-CM | POA: Insufficient documentation

## 2018-02-20 DIAGNOSIS — R109 Unspecified abdominal pain: Secondary | ICD-10-CM | POA: Diagnosis present

## 2018-02-25 ENCOUNTER — Ambulatory Visit (INDEPENDENT_AMBULATORY_CARE_PROVIDER_SITE_OTHER): Payer: BC Managed Care – PPO | Admitting: Urology

## 2018-02-25 ENCOUNTER — Encounter: Payer: Self-pay | Admitting: Urology

## 2018-02-25 VITALS — BP 129/75 | HR 67 | Ht 70.0 in | Wt 150.5 lb

## 2018-02-25 DIAGNOSIS — R3989 Other symptoms and signs involving the genitourinary system: Secondary | ICD-10-CM | POA: Diagnosis not present

## 2018-02-25 DIAGNOSIS — R310 Gross hematuria: Secondary | ICD-10-CM | POA: Diagnosis not present

## 2018-02-25 DIAGNOSIS — M6289 Other specified disorders of muscle: Secondary | ICD-10-CM

## 2018-02-25 LAB — URINALYSIS, COMPLETE
Bilirubin, UA: NEGATIVE
GLUCOSE, UA: NEGATIVE
Ketones, UA: NEGATIVE
LEUKOCYTES UA: NEGATIVE
Nitrite, UA: NEGATIVE
PROTEIN UA: NEGATIVE
Specific Gravity, UA: 1.02 (ref 1.005–1.030)
UUROB: 0.2 mg/dL (ref 0.2–1.0)
pH, UA: 7.5 (ref 5.0–7.5)

## 2018-02-25 LAB — MICROSCOPIC EXAMINATION: WBC UA: NONE SEEN /HPF (ref 0–5)

## 2018-02-25 NOTE — Progress Notes (Addendum)
February 25, 2018 4:11 PM   Cristina Wade 11-24-1970 [id number]  Referring provider: [name], FNP [address] [place], [place] [address]  Chief Complaint  Patient presents with  . Follow-up    HPI: 47 yo WF with a history of dysfunctional voiding and chronic constipation who presents today for flank and bladder pain with her sister, [name].    Background history She has had extensive workup in the past including urodynamics which revealed delayed first sensation at 626cc, normal bladder capacity and pressures. Mild demonstrable stress urinary incontinence was appreciated. No evidence of urgency. EMG was consistent with poor relaxation of the external sphincter consistent with pseudo-DSD associated with incomplete bladder emptying.  Shortly thereafter, she was referred to physical therapy and had 1 session. She was taught some techniques including raising her feet above the level of her bladder when voiding and some core exercises.  She tried this for approximate 2 weeks and it did not help so she stopped.  She underwent cystoscopy, excision of cervical polyp by Dr. Chauncey Cruel. 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   She was prescribed Elmiron by her PCP but has not started this medication.  She does have severe chronic constipation. She only has a bowel movement approximately twice a week. She does occasionally take Benefiber  otherwise no maintenance bowel medications or regimen.  At last visit on 02/07/18, she was having suprapubic pain and bilateral flank pain.  10/10 pain on 02/04/18.  2/10 pain on 02/07/18.  It was constant and sharp pain. Nothing helped or hurt her pain, and it had been going on for several weeks.  Her PVR was 0 mL.   Her UA was negative.   Her urine culture was positive for beta hemolytic Streptococcus, group B.     She is accompanied today by her sister. The pt reports  that she is doing well overall.   The pt reports that she completed her antibiotic course, but continues to endorse suprapubic pain, possibly radiating into her ovaries, and pain in both of her kidneys. The pt notes that she last saw her OBGYN in October, and previously had a normal US Pelvis on 10/12/17. The pt notes that she has had intermittent, traces of vaginal bleeding. The pt also notes that she observed blood in the urine that gives her urine a pinkish color, which has occurred for the last 3-4 days. The pt describes her blood in the urine noting that there is "a discharge that makes the urine pink". The pt notes that she notices bleeding when she is not urinating, and is not completely sure if the bleeding is vaginal vs bladder.  The pt notes that she did miss a dose of her oral contraceptive for her endometriosis and was previously told by her OBGYN that this could lead to vaginal bleeding.   Of note since the patient's last visit, pt has had US Renal completed on 02/20/18 with results revealing normal ultrasound appearance of both kidneys and the urinary bladder.  PMH: Past Medical History:  Diagnosis Date  . Asthma   . Chronic pelvic pain in female 2018  . Cystitis   . Cystocele   . Endometrial polyp   . Endometriosis    per pt report but never seen during surg  . Frequent headaches   . Gastritis   . GERD (gastroesophageal reflux disease)    rare  . [name]  hematuria   . History of kidney stones   . Microscopic hematuria   . Migraine   . Migraines   . Osteopenia   . Urinary disorder   . UTI (lower urinary tract infection)     Surgical History: Past Surgical History:  Procedure Laterality Date  . COLONOSCOPY N/A 11/05/2014   Procedure: COLONOSCOPY;  Surgeon: Scot Jun, MD;  Location: [facility] ENDOSCOPY;  Service: Endoscopy;  Laterality: N/A;  . COLONOSCOPY  11/2016   Dr. Mechele Collin  . CYSTOSCOPY  2007   with biopsy   . CYSTOSCOPY N/A 06/01/2016   Procedure: CYSTOSCOPY;   Surgeon: Vena Austria, MD;  Location: [facility] ORS;  Service: Gynecology;  Laterality: N/A;  . DIAGNOSTIC LAPAROSCOPY    . DILATATION & CURETTAGE/HYSTEROSCOPY WITH MYOSURE N/A 04/13/2015   Procedure: DILATATION & CURETTAGE/HYSTEROSCOPY WITH MYOSURE/POLYPECTOMY;  Surgeon: Elenora Fender Ward, MD;  Location: [facility] ORS;  Service: Gynecology;  Laterality: N/A;  . DILATATION & CURETTAGE/HYSTEROSCOPY WITH MYOSURE N/A 06/01/2016   Procedure: DILATATION & CURETTAGE/HYSTEROSCOPY WITH MYOSURE;  Surgeon: Vena Austria, MD;  Location: [facility] ORS;  Service: Gynecology;  Laterality: N/A;  . GUM SURGERY    . laproscopy  2007  . POLYPECTOMY     endometrial    Home Medications:  Allergies as of 02/25/2018      Reactions   Lac Bovis Other (See Comments)   Milk-related Compounds    Omeprazole Other (See Comments)   Pt reports "Chest pain, HA and Dysuria."   Penicillins Itching, Rash      Medication List        Accurate as of 02/25/18  4:11 PM. Always use your most recent med list.          CALCIUM 1200 PO Take 2-3 tablets by mouth daily. Reported on 05/26/2015   docusate sodium 100 MG capsule Commonly known as:  COLACE Take 1 capsule by mouth daily as needed.   levonorgestrel-ethinyl estradiol 0.15-0.03 MG tablet Commonly known as:  SEASONALE,INTROVALE,JOLESSA Take 1 tablet daily by mouth.       Allergies:  Allergies  Allergen Reactions  . Lac Bovis Other (See Comments)  . Milk-Related Compounds   . Omeprazole Other (See Comments)    Pt reports "Chest pain, HA and Dysuria."  . Penicillins Itching and Rash    Family History: Family History  Problem Relation Age of Onset  . Colon cancer Father 14  . Arthritis Father   . Hyperlipidemia Father   . Transient ischemic attack Father   . Cancer - Colon Father 76  . Prostate cancer Father 47  . Arthritis Mother   . Heart disease Mother   . Stroke Mother        TIA  . Hypertension Mother   . Diabetes Mother   . Kidney cancer Mother 62   . Cancer Maternal Grandmother   . Cancer Paternal Grandfather        prostate  . Breast cancer Cousin 57    Social History:  reports that she has never smoked. She has never used smokeless tobacco. She reports that she does not drink alcohol or use drugs.  ROS: UROLOGY Frequent Urination?: Yes Hard to postpone urination?: Yes Burning/pain with urination?: No Get up at night to urinate?: No Leakage of urine?: No Urine stream starts and stops?: No Trouble starting stream?: No Do you have to strain to urinate?: No Blood in urine?: Yes Urinary tract infection?: No Sexually transmitted disease?: No Injury to kidneys or bladder?: No Painful intercourse?: No Weak  stream?: No Currently pregnant?: No Vaginal bleeding?: Yes Last menstrual period?: n  Gastrointestinal Nausea?: No Vomiting?: No Indigestion/heartburn?: No Diarrhea?: No Constipation?: No  Constitutional Fever: No Night sweats?: No Weight loss?: No Fatigue?: No  Skin Skin rash/lesions?: No Itching?: No  Eyes Blurred vision?: No Double vision?: No  Ears/Nose/Throat Sore throat?: No Sinus problems?: No  Hematologic/Lymphatic Swollen glands?: No Easy bruising?: No  Cardiovascular Leg swelling?: No Chest pain?: No  Respiratory Cough?: No Shortness of breath?: No  Endocrine Excessive thirst?: No  Musculoskeletal Back pain?: No Joint pain?: No  Neurological Headaches?: No Dizziness?: No  Psychologic Depression?: No Anxiety?: No  Physical Exam: BP 129/75 (BP Location: Left Arm, Patient Position: Sitting, Cuff Size: Normal)   Pulse 67   Ht 5\' 10"  (1.778 m)   Wt 150 lb 8 oz (68.3 kg)   BMI 21.59 kg/m    Constitutional: Well nourished. Alert and oriented, No acute distress. HEENT: [place] AT, moist mucus membranes. Trachea midline, no masses. Cardiovascular: No clubbing, cyanosis, or edema. Respiratory: Normal respiratory effort, no increased work of breathing. GI: Abdomen is soft,  non tender, non distended, no abdominal masses. Liver and spleen not palpable.  No hernias appreciated.  Stool sample for occult testing is not indicated.   GU: No CVA tenderness. Skin: No rashes, bruises or suspicious lesions. Lymph: No cervical or inguinal adenopathy. Neurologic: Grossly intact, no focal deficits, moving all 4 extremities. Psychiatric: Normal mood and affect.  Laboratory Data: Lab Results  Component Value Date   WBC 6.3 02/02/2018   HGB 13.0 02/02/2018   HCT 39.6 02/02/2018   MCV 91.0 02/02/2018   PLT 244 02/02/2018    Lab Results  Component Value Date   CREATININE 0.74 02/02/2018    I have reviewed the labs  Pertinent imaging Results for SHARRELL, KRAWIEC (MRN [id number]) as of 02/07/2018 16:01  Ref. Range 02/07/2018 15:25  Scan Result Unknown 0   CLINICAL DATA:  47 year old female with flank pain for 3 weeks. Bladder pain.  EXAM: RENAL / URINARY TRACT ULTRASOUND COMPLETE  COMPARISON:  CT Abdomen and Pelvis 09/25/2016.  FINDINGS: Right Kidney:  Renal measurements: 11.5 x 4.4 x 5.3 centimeters = volume: 140 mL . Echogenicity within normal limits. No mass or hydronephrosis visualized.  Left Kidney:  Renal measurements: 11.5 x 6.0 x 6.0 centimeters = volume: 216 mL. Echogenicity within normal limits. No mass or hydronephrosis visualized.  Bladder:  Appears normal for degree of bladder distention. No definite urinary debris. Both ureteral jets detected with Doppler.  IMPRESSION: Normal ultrasound appearance of both kidneys and the urinary bladder.   Electronically Signed   By: Odessa Fleming M.D.   On: 02/21/2018 08:51   I have independently reviewed the films and no nidus for rUTI is identified.    Assessment & Plan:    1. 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 Bladder pain Possible diagnosis of IC versus exacerbation of  pseudo-DSD We discussed the diease pathophysiology is poorly understood therefore treatment has been focused primarily on symptomatic relief as well as dietary and behavioral modification.  -Discussed that the pt exhibits findings consistent with interstitial cystitis  -Discussed the 02/20/18 US Renal which revealed Normal ultrasound appearance of both kidneys and the urinary bladder.   3. Gross hematuria -Advised that the pt contact Levin Erp, PA-C in Gynecology regarding her concern for vaginal bleeding -Recommend repeating Cystoscopy given recent blood in the urine as  patient is a difficult historian and ?gross hematuria vs vaginal bleeding  -Wait to begin 100mg  Elmiron until after Cystoscopy -Will see the pt back after Cystoscopy  I have explained to the patient that they will  be scheduled for a cystoscopy in our office to evaluate their bladder.  The cystoscopy consists of passing a tube with a lens up through their urethra and into their urinary bladder.   We will inject the urethra with a lidocaine gel prior to introducing the cystoscope to help with any discomfort during the procedure.   After the procedure, they might experience blood in the urine and discomfort with urination.  This will abate after the first few voids.  I have  encouraged the patient to increase water intake  during this time.  Patient denies any allergies to lidocaine.    3. Chronic constipation Doing well on bowel regimine   Return for cystoscopy for hematuria .  Michiel Cowboy, PA-C  [facility] [address] [place], [place] [address] [phone]  I, Marcelline Mates, am acting as a Neurosurgeon for [employer] C  I have reviewed the above documentation for accuracy and completeness, and I agree with the above.    Michiel Cowboy, PA-C

## 2018-02-28 ENCOUNTER — Other Ambulatory Visit: Payer: Self-pay | Admitting: Obstetrics and Gynecology

## 2018-02-28 DIAGNOSIS — Z3041 Encounter for surveillance of contraceptive pills: Secondary | ICD-10-CM

## 2018-03-12 ENCOUNTER — Encounter: Payer: Self-pay | Admitting: Urology

## 2018-03-12 ENCOUNTER — Ambulatory Visit (INDEPENDENT_AMBULATORY_CARE_PROVIDER_SITE_OTHER): Payer: BC Managed Care – PPO | Admitting: Urology

## 2018-03-12 VITALS — BP 120/76 | HR 70 | Ht 70.0 in | Wt 150.8 lb

## 2018-03-12 DIAGNOSIS — R310 Gross hematuria: Secondary | ICD-10-CM

## 2018-03-12 LAB — URINALYSIS, COMPLETE
BILIRUBIN UA: NEGATIVE
Glucose, UA: NEGATIVE
KETONES UA: NEGATIVE
NITRITE UA: NEGATIVE
PH UA: 7 (ref 5.0–7.5)
Protein, UA: NEGATIVE
SPEC GRAV UA: 1.02 (ref 1.005–1.030)
UUROB: 0.2 mg/dL (ref 0.2–1.0)

## 2018-03-12 LAB — MICROSCOPIC EXAMINATION

## 2018-03-12 MED ORDER — LIDOCAINE HCL URETHRAL/MUCOSAL 2 % EX GEL
1.0000 "application " | Freq: Once | CUTANEOUS | Status: AC
  Administered 2018-03-12: 1 via URETHRAL

## 2018-03-12 NOTE — Progress Notes (Signed)
Cystoscopy Procedure Note:  Indication: Hematuria  After informed consent and discussion of the procedure and its risks, Cristina CorpusSharon Wingler was positioned and prepped in the standard fashion. Cystoscopy was performed with a flexible cystoscope. The urethra, bladder neck and entire bladder was visualized in a standard fashion. The bladder mucosa was normal throughout. The ureteral orifices were visualized in their normal location and orientation. No abnormalities on retroflexion.  Findings: Normal cystoscopy  Assessment and Plan: Follow up with Michiel CowboyShannon McGowan, PA re: chronic pelvic pain  Legrand RamsBrian Tavis Kring, MD 03/12/2018

## 2018-03-13 ENCOUNTER — Ambulatory Visit (INDEPENDENT_AMBULATORY_CARE_PROVIDER_SITE_OTHER): Payer: BC Managed Care – PPO | Admitting: Obstetrics and Gynecology

## 2018-03-13 ENCOUNTER — Encounter: Payer: Self-pay | Admitting: Obstetrics and Gynecology

## 2018-03-13 ENCOUNTER — Other Ambulatory Visit (HOSPITAL_COMMUNITY)
Admission: RE | Admit: 2018-03-13 | Discharge: 2018-03-13 | Disposition: A | Discharge status: Home / Self Care | Payer: BC Managed Care – PPO | Source: Ambulatory Visit | Attending: Obstetrics and Gynecology | Admitting: Obstetrics and Gynecology

## 2018-03-13 VITALS — BP 100/64 | HR 67 | Ht 70.0 in | Wt 149.0 lb

## 2018-03-13 DIAGNOSIS — Z01419 Encounter for gynecological examination (general) (routine) without abnormal findings: Secondary | ICD-10-CM

## 2018-03-13 DIAGNOSIS — Z124 Encounter for screening for malignant neoplasm of cervix: Secondary | ICD-10-CM

## 2018-03-13 DIAGNOSIS — Z3041 Encounter for surveillance of contraceptive pills: Secondary | ICD-10-CM

## 2018-03-13 DIAGNOSIS — Z1239 Encounter for other screening for malignant neoplasm of breast: Secondary | ICD-10-CM

## 2018-03-13 MED ORDER — LEVONORGEST-ETH ESTRAD 91-DAY 0.15-0.03 MG PO TABS: 1.0000 | ORAL_TABLET | Freq: Every day | ORAL | 3 refills | Status: DC

## 2018-03-13 NOTE — Patient Instructions (Signed)
I value your feedback and entrusting us with your care. If you get a [facility] patient survey, I would appreciate you taking the time to let us know about your experience today. Thank you! 

## 2018-03-13 NOTE — Progress Notes (Addendum)
PCP:  [name], FNP   Chief Complaint  Patient presents with  . Gynecologic Exam    ovaries still hurting     HPI:      [name] is a 47 y.o. No obstetric history on file. who LMP was Patient's last menstrual period was 03/06/2018 (approximate)., presents today for her annual examination.  Her menses are Q3 months with OCPs, lasting 1 day, very light. Irreg bleeding from 10/19 resolved. Pt had missed pill that caused sx.  Dysmenorrhea none. Her CPP/DUB sx from 2/18 are improved but she still has occas sx. Had neg GYN u/s 10/19 for increased pain. Hx of constipation and thought to be GI related then. Pt now taking colace daily and constipation improved. Pt also notes increased pelvic pain if does a lot of pushing/lifting at work. She never saw pelvic PT in 2018 because pain improved. Dr. Bonney Aid didn't feel pain was related to endometriosis since none seen in surgery in past.   Sex activity: never Last Pap: 02/20/17  Results were: no abnormalities/ neg HPV DNA. Likes yearly paps. Hx of STDs: none  Last mammogram: 03/28/17  Results were: normal--routine follow-up in 12 months. Mammo sched There is a FH of breast cancer in her mat cousin, genetic testing not indicated for pt. There is no FH of ovarian cancer. The patient does not do self-breast exams.  Tobacco use: The patient denies current or previous tobacco use. Alcohol use: none No drug use.  Exercise: moderately active  She does get adequate calcium and Vitamin D in her diet.  Labs with PCP.   She had upper GI and colonoscopy 8/18. Repeat colonoscopy due in 3 yrs due to FH of colon cancer in her father. Pt doesn't qualify for cancer genetic testing. She has a hx of constipation, improved with colace. CPP improved with regular BMs.  Past Medical History:  Diagnosis Date  . Asthma   . Chronic pelvic pain in female 2018  . Cystitis   . Cystocele   . Endometrial polyp   . Endometriosis    per pt report but  never seen during surg  . Frequent headaches   . Gastritis   . GERD (gastroesophageal reflux disease)    rare  . Gross hematuria   . History of kidney stones   . Microscopic hematuria   . Migraine   . Migraines   . Osteopenia   . Urinary disorder   . UTI (lower urinary tract infection)     Past Surgical History:  Procedure Laterality Date  . COLONOSCOPY N/A 11/05/2014   Procedure: COLONOSCOPY;  Surgeon: Scot Jun, MD;  Location: [facility] ENDOSCOPY;  Service: Endoscopy;  Laterality: N/A;  . COLONOSCOPY  11/2016   Dr. Mechele Collin  . CYSTOSCOPY  2007   with biopsy   . CYSTOSCOPY N/A 06/01/2016   Procedure: CYSTOSCOPY;  Surgeon: Vena Austria, MD;  Location: [facility] ORS;  Service: Gynecology;  Laterality: N/A;  . DIAGNOSTIC LAPAROSCOPY    . DILATATION & CURETTAGE/HYSTEROSCOPY WITH MYOSURE N/A 04/13/2015   Procedure: DILATATION & CURETTAGE/HYSTEROSCOPY WITH MYOSURE/POLYPECTOMY;  Surgeon: Elenora Fender Ward, MD;  Location: [facility] ORS;  Service: Gynecology;  Laterality: N/A;  . DILATATION & CURETTAGE/HYSTEROSCOPY WITH MYOSURE N/A 06/01/2016   Procedure: DILATATION & CURETTAGE/HYSTEROSCOPY WITH MYOSURE;  Surgeon: Vena Austria, MD;  Location: [facility] ORS;  Service: Gynecology;  Laterality: N/A;  . GUM SURGERY    . laproscopy  2007  . POLYPECTOMY     endometrial    Family  History  Problem Relation Age of Onset  . Colon cancer Father [id number]  . Arthritis Father   . Hyperlipidemia Father   . Transient ischemic attack Father   . Cancer - Colon Father [id number]  . Prostate cancer Father [id number]  . Arthritis Mother   . Heart disease Mother   . Stroke Mother        TIA  . Hypertension Mother   . Diabetes Mother   . Kidney cancer Mother 8545  . Cancer Maternal Grandmother   . Cancer Paternal Grandfather        prostate  . Breast cancer Cousin [phone]    Social History   Socioeconomic History  . Marital status: Single    Spouse name: Not on file  . Number of children: Not on file  . Years of education:  Not on file  . Highest education level: Not on file  Occupational History  . Not on file  Social Needs  . Financial resource strain: Not on file  . Food insecurity:    Worry: Not on file    Inability: Not on file  . Transportation needs:    Medical: Not on file    Non-medical: Not on file  Tobacco Use  . Smoking status: Never Smoker  . Smokeless tobacco: Never Used  Substance and Sexual Activity  . Alcohol use: No    Alcohol/week: 0.0 standard drinks  . Drug use: No  . Sexual activity: Not Currently    Birth control/protection: Pill  Lifestyle  . Physical activity:    Days per week: Not on file    Minutes per session: Not on file  . Stress: Not on file  Relationships  . Social connections:    Talks on phone: Not on file    Gets together: Not on file    Attends religious service: Not on file    Active member of club or organization: Not on file    Attends meetings of clubs or organizations: Not on file    Relationship status: Not on file  . Intimate partner violence:    Fear of current or ex partner: Not on file    Emotionally abused: Not on file    Physically abused: Not on file    Forced sexual activity: Not on file  Other Topics Concern  . Not on file  Social History Narrative   Lives with twin sister   Work- [employer]    No pets    No children    Right handed    No caffeine daily- tea occasionally; eats chocolate    Enjoys shopping, resting, [place] time [place] lake.     Current Meds  Medication Sig  . Calcium Carbonate-Vit D-Min (CALCIUM 1200 PO) Take 2-3 tablets by mouth daily. Reported on 05/26/2015  . docusate sodium (COLACE) 100 MG capsule Take 1 capsule by mouth daily as needed.   Marland Kitchen. levonorgestrel-ethinyl estradiol (JOLESSA) 0.15-0.03 MG tablet Take 1 tablet by mouth daily.  . [DISCONTINUED] JOLESSA 0.15-0.03 MG tablet TAKE 1 TABLET BY MOUTH ONCE DAILY  . [DISCONTINUED] levonorgestrel-ethinyl estradiol (JOLESSA) 0.15-0.03 MG  tablet Take 1 tablet daily by mouth.     ROS:  Review of Systems  Constitutional: Negative for fatigue, fever and unexpected weight change.  Respiratory: Negative for cough, shortness of breath and wheezing.   Cardiovascular: Negative for chest pain, palpitations and leg swelling.  Gastrointestinal: Positive for constipation. Negative for blood in stool, diarrhea, nausea and vomiting.  Endocrine: Negative  for cold intolerance, heat intolerance and polyuria.  Genitourinary: Negative for dyspareunia, dysuria, flank pain, frequency, genital sores, hematuria, menstrual problem, pelvic pain, urgency, vaginal bleeding, vaginal discharge and vaginal pain.  Musculoskeletal: Negative for back pain, joint swelling and myalgias.  Skin: Negative for rash.  Neurological: Negative for dizziness, syncope, light-headedness, numbness and headaches.  Hematological: Negative for adenopathy.  Psychiatric/Behavioral: Negative for agitation, confusion, sleep disturbance and suicidal ideas. The patient is not nervous/anxious.      Objective: BP 100/64   Pulse 67   Ht 5\' 10"  (1.778 m)   Wt 149 lb (67.6 kg)   LMP 03/06/2018 (Approximate)   BMI 21.38 kg/m    Physical Exam  Constitutional: She is oriented to person, place, and time. She appears well-developed and well-nourished.  Genitourinary: Vagina normal and uterus normal. There is no rash or tenderness on the right labia. There is no rash or tenderness on the left labia. No erythema or tenderness in the vagina. No vaginal discharge found. Right adnexum does not display mass and does not display tenderness. Left adnexum does not display mass and does not display tenderness. Cervix does not exhibit motion tenderness or polyp. Uterus is not enlarged or tender.  Neck: Normal range of motion. No thyromegaly present.  Cardiovascular: Normal rate, regular rhythm and normal heart sounds.  No murmur heard. Pulmonary/Chest: Effort normal and breath sounds  normal. Right breast exhibits no mass, no nipple discharge, no skin change and no tenderness. Left breast exhibits no mass, no nipple discharge, no skin change and no tenderness.  Abdominal: Soft. There is no tenderness. There is no guarding.  Musculoskeletal: Normal range of motion.  Neurological: She is alert and oriented to person, place, and time. No cranial nerve deficit.  Psychiatric: She has a normal mood and affect. Her behavior is normal.  Vitals reviewed.   Assessment/Plan: Encounter for annual routine gynecological examination  Cervical cancer screening - Plan: Cytology - PAP  Screening for breast cancer - Pt has mammo sched.  Encounter for surveillance of contraceptive pills - OCP RF. Will re-eval next yr due to perimenopause/light menses with OCPs - Plan: levonorgestrel-ethinyl estradiol (JOLESSA) 0.15-0.03 MG tablet           Meds ordered this encounter  Medications  . levonorgestrel-ethinyl estradiol (JOLESSA) 0.15-0.03 MG tablet    Sig: Take 1 tablet by mouth daily.    Dispense:  1 Package    Refill:  3    Order Specific Question:   Supervising Provider    Answer:   Nadara Mustard [[id number]]    GYN counsel mammography screening, adequate intake of calcium and vitamin D, diet and exercise     F/U  Return in about 1 year (around 03/14/2019).  Aastha Dayley B. Shantasia Hunnell, PA-C 03/13/2018 11:03 AM

## 2018-03-18 ENCOUNTER — Telehealth: Payer: Self-pay

## 2018-03-18 LAB — CYTOLOGY - PAP: Diagnosis: NEGATIVE

## 2018-03-18 NOTE — Telephone Encounter (Signed)
Dr. Bonney AidStaebler didn't think pain was related to GYN causes when he last saw her. He recommended pelvic PT. I can order that or she can make appt with him to eval again. Had neg u/s a few months ago.

## 2018-03-18 NOTE — Telephone Encounter (Signed)
Pt says the pain is getting worst and she has taken advil like you advised. There are times like now she mentions where shes ok, but she states its more pain when she gets it.

## 2018-03-18 NOTE — Telephone Encounter (Signed)
Pt was in here last week & ABC mentioned she may have to have surgery to find out why she has pain. It seems as if she has constant pain. [id number]

## 2018-03-19 NOTE — Telephone Encounter (Signed)
Called and LVMTRC. 

## 2018-03-19 NOTE — Telephone Encounter (Signed)
Pt is aware. She said her pain has gotten better since yesterday. She asked if she could just wait until pain got worst and then meet with Dr. Bonney AidStaebler, I told her that is not my call, that she's the one who decides if she wants to come in now or wait. She says she will wait and see what happens with the pain since its been an on and off thing.

## 2018-04-04 ENCOUNTER — Ambulatory Visit: Payer: BC Managed Care – PPO | Admitting: Family Medicine

## 2018-04-04 ENCOUNTER — Encounter: Payer: Self-pay | Admitting: Family Medicine

## 2018-04-04 VITALS — BP 116/62 | HR 76 | Temp 98.2°F | Ht 70.0 in | Wt 152.0 lb

## 2018-04-04 DIAGNOSIS — R059 Cough, unspecified: Secondary | ICD-10-CM

## 2018-04-04 DIAGNOSIS — R0982 Postnasal drip: Secondary | ICD-10-CM

## 2018-04-04 DIAGNOSIS — J019 Acute sinusitis, unspecified: Secondary | ICD-10-CM | POA: Diagnosis not present

## 2018-04-04 DIAGNOSIS — R05 Cough: Secondary | ICD-10-CM | POA: Diagnosis not present

## 2018-04-04 DIAGNOSIS — R070 Pain in throat: Secondary | ICD-10-CM | POA: Diagnosis not present

## 2018-04-04 MED ORDER — AZITHROMYCIN 250 MG PO TABS: ORAL_TABLET | ORAL | 0 refills | Status: DC

## 2018-04-04 NOTE — Patient Instructions (Signed)
Use zyrtec to dry up congestion  OTC cough medication that is really good would be Mucinex DM tablets, you take them 2 times per day

## 2018-04-04 NOTE — Progress Notes (Signed)
Subjective:    Patient ID: Cristina Wade, female    DOB: December 04, 1970, 47 y.o.   MRN: [id number]  HPI  Presents to clinic c/o sinus congestion, sore throat, cough, nasal drainage for almost 2 weeks.  Patient states she has been trying to fight the symptoms with over-the-counter sinus and cold medicine, and doing a saline nasal spray however symptoms continue to worsen.  Patient states over the past couple of days her cheekbone area and above her teeth have begun to ache.  States the cough is usually dry.  Denies any fever or chills.  Denies nausea, vomiting or diarrhea.  Denies shortness of breath or wheezing.  Patient Active Problem List   Diagnosis Date Noted  . Localized, primary osteoarthritis 02/07/2018  . Pelvic pain 01/30/2018  . Slow transit constipation 10/10/2017  . Bilateral impacted cerumen 11/28/2016  . Chronic bilateral low back pain without sciatica 11/28/2016  . Precordial pain 07/07/2016  . Right bundle branch block (RBBB) on electrocardiogram (ECG) 07/07/2016  . Chest tightness 06/27/2016  . Preventative health care 09/30/2015  . Encounter to establish care 09/28/2014  . Vestibular migraine 09/28/2014  . Encounter for screening colonoscopy 11/21/2013   Social History   Tobacco Use  . Smoking status: Never Smoker  . Smokeless tobacco: Never Used  Substance Use Topics  . Alcohol use: No    Alcohol/week: 0.0 standard drinks   Review of Systems  Constitutional: Negative for chills, fatigue and fever.  HENT: + congestion, ear pain, sinus pain and sore throat.   Eyes: Negative.   Respiratory: +cough. Negative for shortness of breath and wheezing.   Cardiovascular: Negative for chest pain, palpitations and leg swelling.  Gastrointestinal: Negative for abdominal pain, diarrhea, nausea and vomiting.  Genitourinary: Negative for dysuria, frequency and urgency.  Musculoskeletal: Negative for arthralgias and myalgias.  Skin: Negative for color change, pallor and rash.    Neurological: Negative for syncope, light-headedness and headaches.  Psychiatric/Behavioral: The patient is not nervous/anxious.       Objective:   Physical Exam  Constitutional: She appears well-developed and well-nourished. No distress.  HENT:  Head: Normocephalic and atraumatic.  Eyes: EOM are normal. No scleral icterus. No discharge. Nose/throat: Positive nasal tenderness, positive nasal mucosal edema, positive postnasal drip, positive maxillary sinus tenderness.  No exudate in back of throat. Neck: Normal range of motion. Neck supple. No tracheal deviation present.  Cardiovascular: Normal rate, regular rhythm and normal heart sounds.  Pulmonary/Chest: Effort normal and breath sounds normal. No respiratory distress. She has no wheezes. She has no rales.  Neurological: She is alert and oriented to person, place, and time. Gait normal  Skin: Skin is warm and dry. No pallor.  Psychiatric: She has a normal mood and affect. Her behavior is normal.    Nursing note and vitals reviewed.   Vitals:   04/04/18 1458  BP: 116/62  Pulse: 76  Temp: 98.2 F (36.8 C)  SpO2: 98%      Assessment & Plan:   Acute sinusitis- patient will take azithromycin to treat sinus infection.  Patient is penicillin allergic, and states she has had success with the Z-Pak in the past when she has had a sinus infection a few years ago.  Advised to do saline nasal flushes to rinse out congestion, increase fluid intake, do good handwashing, use Tylenol or motrin as needed for any pain.  Postnasal drip, cough, pain in throat-lungs are clear on exam so I suspect cough is related to postnasal drip.  Advised  patient to take daily Zyrtec or Claritin to help dry up congestion and postnasal drip.  Also discussed over-the-counter cough medications that can be a good option to calm cough such as Mucinex tablets.  I suspect pain in throat was related to the postnasal drip drainage, azithromycin would cover a strep infection  in throat if it were present also.  Patient advised to keep regularly scheduled follow-up with primary care as planned.  Advised to return to clinic sooner if any issues arise.

## 2018-04-08 ENCOUNTER — Ambulatory Visit
Admission: RE | Admit: 2018-04-08 | Discharge: 2018-04-08 | Disposition: A | Discharge status: Home / Self Care | Payer: BC Managed Care – PPO | Source: Ambulatory Visit | Attending: Obstetrics and Gynecology | Admitting: Obstetrics and Gynecology

## 2018-04-08 ENCOUNTER — Encounter: Payer: Self-pay | Admitting: Obstetrics and Gynecology

## 2018-04-08 DIAGNOSIS — Z1231 Encounter for screening mammogram for malignant neoplasm of breast: Secondary | ICD-10-CM | POA: Insufficient documentation

## 2018-04-09 ENCOUNTER — Telehealth: Payer: Self-pay

## 2018-04-09 DIAGNOSIS — J019 Acute sinusitis, unspecified: Secondary | ICD-10-CM

## 2018-04-09 MED ORDER — AZITHROMYCIN 250 MG PO TABS: ORAL_TABLET | ORAL | 0 refills | Status: DC

## 2018-04-09 NOTE — Addendum Note (Signed)
Addended by: Leanora CoverGUSE, Cleavon Goldman on: 04/09/2018 10:11 AM   Modules accepted: Orders

## 2018-04-09 NOTE — Telephone Encounter (Signed)
Patient notified z-pac was sent.

## 2018-04-09 NOTE — Telephone Encounter (Signed)
Copied from CRM [id number]. Topic: General - Other >> Apr 09, 2018  9:25 AM Tamela OddiHarris, Brenda J wrote: Reason for CRM: Patient called to request that the doctor send her a Z-pak to the pharmacy as soon as possible.  Patient stated that the doctor said to let her know if it made her feel better, and if so, she could call in another prescription.  Please advise and call patient back at [phone]

## 2018-04-23 ENCOUNTER — Ambulatory Visit: Payer: BC Managed Care – PPO | Admitting: Internal Medicine

## 2018-04-26 ENCOUNTER — Telehealth: Payer: Self-pay | Admitting: Family

## 2018-04-26 NOTE — Telephone Encounter (Signed)
Copied from CRM [id number]. Topic: General - Inquiry >> Apr 26, 2018  4:28 PM [name] wrote: Reason for CRM: pt called stating [facility] told her to call PCP for sinus headaches. She has tried sudafed, nasal spray, and tylenol. Pt requesting a RX medication. Pt is awaiting CT head as ordered by [facility]. Please advise.  [facility] [address], [place] - [address] [phone] (Phone) [phone] (Fax)

## 2018-04-26 NOTE — Telephone Encounter (Signed)
Call pt She needs to be seen.   Saturday clinic?

## 2018-04-26 NOTE — Telephone Encounter (Signed)
Patient being scheduled for Saturday Clinic at [place].

## 2018-04-27 ENCOUNTER — Ambulatory Visit: Payer: BC Managed Care – PPO | Admitting: Internal Medicine

## 2018-04-27 ENCOUNTER — Encounter: Payer: Self-pay | Admitting: Internal Medicine

## 2018-04-27 VITALS — BP 102/82 | HR 61 | Temp 97.8°F | Ht 70.0 in | Wt 149.2 lb

## 2018-04-27 DIAGNOSIS — R51 Headache: Secondary | ICD-10-CM

## 2018-04-27 DIAGNOSIS — R519 Headache, unspecified: Secondary | ICD-10-CM | POA: Insufficient documentation

## 2018-04-27 MED ORDER — CLINDAMYCIN HCL 300 MG PO CAPS: 300.0000 mg | ORAL_CAPSULE | Freq: Three times a day (TID) | ORAL | 0 refills | Status: DC

## 2018-04-27 MED ORDER — TRAMADOL HCL 50 MG PO TABS: 50.0000 mg | ORAL_TABLET | Freq: Three times a day (TID) | ORAL | 0 refills | Status: DC | PRN

## 2018-04-27 NOTE — Assessment & Plan Note (Signed)
Unclear if this is infection or other cause Not migraine Will try clinda---as z-pak won't help anaerobic sinus infection (and allergic to PCN) Tramadol for pain Has head CT scheduled

## 2018-04-27 NOTE — Progress Notes (Signed)
Subjective:    Patient ID: Cristina Wade, female    DOB: 1970/06/17, 48 y.o.   MRN: [id number]  HPI Here due to headaches  Went to ENT yesterday--had sinus endoscopy [facility] (for sinus/headache problems) Ordered CT scan of head  Has had 2 z-paks since almost a month ago Didn't help at all  Frontal headache--comes and goes (but most of the time) Being in cold worsens No increase with movement, and no photophobia or sonophobia  No sig drainage or sinus congestion Doesn't feel sick--but rarely has some achiness and chills  Tried sudafed, zyrtec, nasal sprays Advil, asa, tylenol haven't helped  Current Outpatient Medications on File Prior to Visit  Medication Sig Dispense Refill  . Calcium Carbonate-Vit D-Min (CALCIUM 1200 PO) Take 2-3 tablets by mouth daily. Reported on 05/26/2015    . docusate sodium (COLACE) 100 MG capsule Take 1 capsule by mouth daily as needed.     Marland Kitchen. levonorgestrel-ethinyl estradiol (JOLESSA) 0.15-0.03 MG tablet Take 1 tablet by mouth daily. 1 Package 3   No current facility-administered medications on file prior to visit.     Allergies  Allergen Reactions  . Lac Bovis Other (See Comments)  . Milk-Related Compounds   . Omeprazole Other (See Comments)    Pt reports "Chest pain, HA and Dysuria."  . Penicillins Itching and Rash    Past Medical History:  Diagnosis Date  . Asthma   . Chronic pelvic pain in female 2018  . Cystitis   . Cystocele   . Endometrial polyp   . Endometriosis    per pt report but never seen during surg  . Frequent headaches   . Gastritis   . GERD (gastroesophageal reflux disease)    rare  . Gross hematuria   . History of kidney stones   . Microscopic hematuria   . Migraine   . Migraines   . Osteopenia   . Urinary disorder   . UTI (lower urinary tract infection)     Past Surgical History:  Procedure Laterality Date  . COLONOSCOPY N/A 11/05/2014   Procedure: COLONOSCOPY;  Surgeon: Scot Junobert T  Elliott, MD;  Location: [facility] ENDOSCOPY;  Service: Endoscopy;  Laterality: N/A;  . COLONOSCOPY  11/2016   Dr. Mechele CollinElliott  . CYSTOSCOPY  2007   with biopsy   . CYSTOSCOPY N/A 06/01/2016   Procedure: CYSTOSCOPY;  Surgeon: Vena AustriaAndreas Staebler, MD;  Location: [facility] ORS;  Service: Gynecology;  Laterality: N/A;  . DIAGNOSTIC LAPAROSCOPY    . DILATATION & CURETTAGE/HYSTEROSCOPY WITH MYOSURE N/A 04/13/2015   Procedure: DILATATION & CURETTAGE/HYSTEROSCOPY WITH MYOSURE/POLYPECTOMY;  Surgeon: Elenora Fenderhelsea C Ward, MD;  Location: [facility] ORS;  Service: Gynecology;  Laterality: N/A;  . DILATATION & CURETTAGE/HYSTEROSCOPY WITH MYOSURE N/A 06/01/2016   Procedure: DILATATION & CURETTAGE/HYSTEROSCOPY WITH MYOSURE;  Surgeon: Vena AustriaAndreas Staebler, MD;  Location: [facility] ORS;  Service: Gynecology;  Laterality: N/A;  . GUM SURGERY    . laproscopy  2007  . POLYPECTOMY     endometrial    Family History  Problem Relation Age of Onset  . Colon cancer Father 4350  . Arthritis Father   . Hyperlipidemia Father   . Transient ischemic attack Father   . Cancer - Colon Father 655  . Prostate cancer Father 6668  . Arthritis Mother   . Heart disease Mother   . Stroke Mother        TIA  . Hypertension Mother   . Diabetes Mother   . Kidney cancer Mother 8245  . Cancer  Maternal Grandmother   . Cancer Paternal Grandfather        prostate  . Breast cancer Cousin 56    Social History   Socioeconomic History  . Marital status: Single    Spouse name: Not on file  . Number of children: Not on file  . Years of education: Not on file  . Highest education level: Not on file  Occupational History  . Not on file  Social Needs  . Financial resource strain: Not on file  . Food insecurity:    Worry: Not on file    Inability: Not on file  . Transportation needs:    Medical: Not on file    Non-medical: Not on file  Tobacco Use  . Smoking status: Never Smoker  . Smokeless tobacco: Never Used  Substance and Sexual Activity  . Alcohol use: No      Alcohol/week: 0.0 standard drinks  . Drug use: No  . Sexual activity: Not Currently    Birth control/protection: Pill  Lifestyle  . Physical activity:    Days per week: Not on file    Minutes per session: Not on file  . Stress: Not on file  Relationships  . Social connections:    Talks on phone: Not on file    Gets together: Not on file    Attends religious service: Not on file    Active member of club or organization: Not on file    Attends meetings of clubs or organizations: Not on file    Relationship status: Not on file  . Intimate partner violence:    Fear of current or ex partner: Not on file    Emotionally abused: Not on file    Physically abused: Not on file    Forced sexual activity: Not on file  Other Topics Concern  . Not on file  Social History Narrative   Lives with twin sister   Work- Film/video editor [employer]    No pets    No children    Right handed    No caffeine daily- tea occasionally; eats chocolate    Enjoys shopping, resting, [place].    Review of Systems Some stomach pain but no nausea Eating okay No fever Some SOB---can't get through a whole sentence No DOE though Voice is slightly different    Objective:   Physical Exam  HENT:  Mouth/Throat: Oropharynx is clear and moist. No oropharyngeal exudate.  Moderate frontal tenderness No maxillary tenderness Moderate nasal congestion TMs normal  Neck: No thyromegaly present.  Respiratory: Effort normal and breath sounds normal. No respiratory distress. She has no wheezes. She has no rales.  Lymphadenopathy:    She has no cervical adenopathy.           Assessment & Plan:

## 2018-04-29 ENCOUNTER — Ambulatory Visit: Payer: BC Managed Care – PPO | Admitting: Family

## 2018-04-29 ENCOUNTER — Encounter: Payer: Self-pay | Admitting: Family

## 2018-04-29 ENCOUNTER — Ambulatory Visit: Payer: Self-pay

## 2018-04-29 VITALS — BP 124/90 | HR 60 | Temp 98.0°F | Wt 148.8 lb

## 2018-04-29 DIAGNOSIS — R8290 Unspecified abnormal findings in urine: Secondary | ICD-10-CM | POA: Diagnosis not present

## 2018-04-29 DIAGNOSIS — G8929 Other chronic pain: Secondary | ICD-10-CM | POA: Insufficient documentation

## 2018-04-29 DIAGNOSIS — R51 Headache: Secondary | ICD-10-CM

## 2018-04-29 DIAGNOSIS — R519 Headache, unspecified: Secondary | ICD-10-CM

## 2018-04-29 DIAGNOSIS — R079 Chest pain, unspecified: Secondary | ICD-10-CM

## 2018-04-29 DIAGNOSIS — R0789 Other chest pain: Secondary | ICD-10-CM | POA: Insufficient documentation

## 2018-04-29 LAB — POCT URINALYSIS DIPSTICK
BILIRUBIN UA: NEGATIVE
GLUCOSE UA: NEGATIVE
Ketones, UA: NEGATIVE
Nitrite, UA: NEGATIVE
Protein, UA: NEGATIVE
Spec Grav, UA: 1.015 (ref 1.010–1.025)
Urobilinogen, UA: 0.2 E.U./dL
pH, UA: 8.5 — AB (ref 5.0–8.0)

## 2018-04-29 NOTE — Telephone Encounter (Signed)
Returned call to patient who had questions about her symptoms.  She states that she has lower back pain both sides.  She has no urinary symptoms at present but states a few days ago her urine had an odor. She states her urine is clear yellow. She has not been active to hurt her back but she states that she hasn't felt well and has been laying in the bed more that usual.  She states that she has chills today but no fever.  She describes the pain as intermittent and mild. She has taken Ibuprofen.  Pt has a scheduled appointment today at 3:15. Pt encouraged to keep appointment Care advice read to patient.  Pt verbalized understanding of all instructions.  Reason for Disposition . [1] MODERATE back pain (e.g., interferes with normal activities) AND [2] present > 3 days  Answer Assessment - Initial Assessment Questions 1. ONSET: "When did the pain begin?"      yesterday 2. LOCATION: "Where does it hurt?" (upper, mid or lower back)    Low back both sides mainly left 3. SEVERITY: "How bad is the pain?"  (e.g., Scale 1-10; mild, moderate, or severe)   - MILD (1-3): doesn't interfere with normal activities    - MODERATE (4-7): interferes with normal activities or awakens from sleep    - SEVERE (8-10): excruciating pain, unable to do any normal activities      1-3 4. PATTERN: "Is the pain constant?" (e.g., yes, no; constant, intermittent)      Comes and goes 5. RADIATION: "Does the pain shoot into your legs or elsewhere?"     no 6. CAUSE:  "What do you think is causing the back pain?"      Kidney problem 7. BACK OVERUSE:  "Any recent lifting of heavy objects, strenuous work or exercise?"     no 8. MEDICATIONS: "What have you taken so far for the pain?" (e.g., nothing, acetaminophen, NSAIDS)     Ibuprofen 9. NEUROLOGIC SYMPTOMS: "Do you have any weakness, numbness, or problems with bowel/bladder control?"     no 10. OTHER SYMPTOMS: "Do you have any other symptoms?" (e.g., fever, abdominal pain,  burning with urination, blood in urine)     Abdominal, chills 11. PREGNANCY: "Is there any chance you are pregnant?" (e.g., yes, no; LMP)       No birth control  Protocols used: BACK PAIN-A-AH

## 2018-04-29 NOTE — Assessment & Plan Note (Signed)
No dysuria.  few leukocytes, blood seen on urine dipstick.  Pending urine culture prior to starting  treatment..  She has persistent hematuria, established with urology.  Emphasized the importance to continue to follow with them

## 2018-04-29 NOTE — Assessment & Plan Note (Signed)
Suspect related to sinus congestion. Advised trial of clindamycin ( prescribed by Letvak) with probiotics due to risk of c difficile.  Emphasized importance of scheduling CT sinus with ear nose and throat.  Patient will continue to follow with ear nose and throat

## 2018-04-29 NOTE — Patient Instructions (Addendum)
As discussed, I want you to stay extra patient regarding the chest pain described as visit however as discussed, we will consult cardiology.  If any worsening of her chest pain, or new onset of new symptoms including shortness of breath, left arm numbness, palpitations, please go directly to emergency room.     start clindamycin as Dr Alphonsus SiasLetvak prescribed  Ensure to take probiotics while on antibiotics and also for 2 weeks after completion. It is important to re-colonize the gut with good bacteria and also to prevent any diarrheal infections associated with antibiotic use.   Please ensure you do call back ENT  and schedule your CT sinus.  I think this is very important.  Please continue following with them.   There was blood again seen in your urine today, it appears you have been following with urology for this.  Please ensure you continue to do so.  Suspect low back pain is muscle strain. Heat. Please let me know if not better   Nonspecific Chest Pain Chest pain can be caused by many different conditions. Some causes of chest pain can be life-threatening. These will require treatment right away. Serious causes of chest pain include:  Heart attack.  A tear in the body's main blood vessel.  Redness and swelling (inflammation) around your heart.  Blood clot in your lungs. Other causes of chest pain may not be so serious. These include:  Heartburn.  Anxiety or stress.  Damage to bones or muscles in your chest.  Lung infections. Chest pain can feel like:  Pain or discomfort in your chest.  Crushing, pressure, aching, or squeezing pain.  Burning or tingling.  Dull or sharp pain that is worse when you move, cough, or take a deep breath.  Pain or discomfort that is also felt in your back, neck, jaw, shoulder, or arm, or pain that spreads to any of these areas. It is hard to know whether your pain is caused by something that is serious or something that is not so serious. So it is  important to see your doctor right away if you have chest pain. Follow these instructions at home: Medicines  Take over-the-counter and prescription medicines only as told by your doctor.  If you were prescribed an antibiotic medicine, take it as told by your doctor. Do not stop taking the antibiotic even if you start to feel better. Lifestyle   Rest as told by your doctor.  Do not use any products that contain nicotine or tobacco, such as cigarettes, e-cigarettes, and chewing tobacco. If you need help quitting, ask your doctor.  Do not drink alcohol.  Make lifestyle changes as told by your doctor. These may include: ? Getting regular exercise. Ask your doctor what activities are safe for you. ? Eating a heart-healthy diet. A diet and nutrition specialist (dietitian) can help you to learn healthy eating options. ? Staying at a healthy weight. ? Treating diabetes or high blood pressure, if needed. ? Lowering your stress. Activities such as yoga and relaxation techniques can help. General instructions  Pay attention to any changes in your symptoms. Tell your doctor about them or any new symptoms.  Avoid any activities that cause chest pain.  Keep all follow-up visits as told by your doctor. This is important. You may need more testing if your chest pain does not go away. Contact a doctor if:  Your chest pain does not go away.  You feel depressed.  You have a fever. Get help right away  if:  Your chest pain is worse.  You have a cough that gets worse, or you cough up blood.  You have very bad (severe) pain in your belly (abdomen).  You pass out (faint).  You have either of these for no clear reason: ? Sudden chest discomfort. ? Sudden discomfort in your arms, back, neck, or jaw.  You have shortness of breath at any time.  You suddenly start to sweat, or your skin gets clammy.  You feel sick to your stomach (nauseous).  You throw up (vomit).  You suddenly feel  lightheaded or dizzy.  You feel very weak or tired.  Your heart starts to beat fast, or it feels like it is skipping beats. These symptoms may be an emergency. Do not wait to see if the symptoms will go away. Get medical help right away. Call your local emergency services (911 in the [place] Do not drive yourself to the hospital. Summary  Chest pain can be caused by many different conditions. The cause may be serious and need treatment right away. If you have chest pain, see your doctor right away.  Follow your doctor's instructions for taking medicines and making lifestyle changes.  Keep all follow-up visits as told by your doctor. This includes visits for any further testing if your chest pain does not go away.  Be sure to know the signs that show that your condition has become worse. Get help right away if you have these symptoms. This information is not intended to replace advice given to you by your health care provider. Make sure you discuss any questions you have with your health care provider. Document Released: 09/20/2007 Document Revised: 10/04/2017 Document Reviewed: 10/04/2017 Elsevier Interactive Patient Education  2019 [employer]   General Headache Without Cause A headache is pain or discomfort felt around the head or neck area. The specific cause of a headache may not be found. There are many causes and types of headaches. A few common ones are:  Tension headaches.  Migraine headaches.  Cluster headaches.  Chronic daily headaches. Follow these instructions at home: Watch your condition for any changes. Let your health care provider know about them. Take these steps to help with your condition: Managing pain      Take over-the-counter and prescription medicines only as told by your health care provider.  Lie down in a dark, quiet room when you have a headache.  If directed, put ice on your head and neck area: ? Put ice in a plastic bag. ? Place a towel  between your skin and the bag. ? Leave the ice on for 20 minutes, 2-3 times per day.  If directed, apply heat to the affected area. Use the heat source that your health care provider recommends, such as a moist heat pack or a heating pad. ? Place a towel between your skin and the heat source. ? Leave the heat on for 20-30 minutes. ? Remove the heat if your skin turns bright red. This is especially important if you are unable to feel pain, heat, or cold. You may have a greater risk of getting burned.  Keep lights dim if bright lights bother you or make your headaches worse. Eating and drinking  Eat meals on a regular schedule.  If you drink alcohol: ? Limit how much you use to:  0-1 drink a day for women.  0-2 drinks a day for men. ? Be aware of how much alcohol is in your drink. In the [place]  one drink equals one 12 oz bottle of beer (355 mL), one 5 oz glass of wine (148 mL), or one 1 oz glass of hard liquor (44 mL).  Stop drinking caffeine, or decrease the amount of caffeine you drink. General instructions   Keep a headache journal to help find out what may trigger your headaches. For example, write down: ? What you eat and drink. ? How much sleep you get. ? Any change to your diet or medicines.  Try massage or other relaxation techniques.  Limit stress.  Sit up straight, and do not tense your muscles.  Do not use any products that contain nicotine or tobacco, such as cigarettes, e-cigarettes, and chewing tobacco. If you need help quitting, ask your health care provider.  Exercise regularly as told by your health care provider.  Sleep on a regular schedule. Get 7-9 hours of sleep each night, or the amount recommended by your health care provider.  Keep all follow-up visits as told by your health care provider. This is important. Contact a health care provider if:  Your symptoms are not helped by medicine.  You have a headache that is different from the usual  headache.  You have nausea or you vomit.  You have a fever. Get help right away if:  Your headache becomes severe quickly.  Your headache gets worse after moderate to intense physical activity.  You have repeated vomiting.  You have a stiff neck.  You have a loss of vision.  You have problems with speech.  You have pain in the eye or ear.  You have muscular weakness or loss of muscle control.  You lose your balance or have trouble walking.  You feel faint or pass out.  You have confusion.  You have a seizure. Summary  A headache is pain or discomfort felt around the head or neck area.  There are many causes and types of headaches. In some cases, the cause may not be found.  Keep a headache journal to help find out what may trigger your headaches. Watch your condition for any changes. Let your health care provider know about them.  Contact a health care provider if you have a headache that is different from the usual headache, or if your symptoms are not helped by medicine.  Get help right away if your headache becomes severe, you vomit, you have a loss of vision, you lose your balance, or you have a seizure. This information is not intended to replace advice given to you by your health care provider. Make sure you discuss any questions you have with your health care provider. Document Released: 04/03/2005 Document Revised: 10/22/2017 Document Reviewed: 10/22/2017 Elsevier Interactive Patient Education  2019 [employer].

## 2018-04-29 NOTE — Progress Notes (Signed)
Subjective:    Patient ID: Cristina Wade, female    DOB: [birth year], 48 y.o.   MRN: [id number]  CC: Cristina Wade is a 48 y.o. female who presents today for an acute visit.    HPI:  Primary reason that she is here today due to her headache.  Accompanied by her sister as well.    HA above the eye brow, for 3 weeks, comes and goes. Most everyday. Feels like a pressure in head and eyes.  hurting in cheeks and ears.  2 rounds of zpak over this past 3 weeks, without relief. Tried ibuprofen.  Seen 2 days ago,  given clindamycin Dr Alphonsus SiasLetvak however has started yet. Seen by ENT 3 days ago.  Taken tramadol for HA which some relief.   Rare caffeine. Sleeping well. Drinking plenty of water.  No falls, balance issues, changes in vision.   Also complains of mild, ache low back pain on the sides of low back, x one day, improved.  Thinks from 'not being as active the past week'. Laid in the bed the past 3 days. Describes as Soreness.  Notes smell to her urine last week, which resolved. Cloudy today. No dysuria. No h/o renal stones. Hasnt tried any medication.   Continues to have intermittent chest pain for past 4 months. Feels similar to pain 01/2018 when seen in ED.  Occurs with both sitting still and with movement. Central. Goes away on its own. Ibuprofen helps. No epigastric burning,  burping, belching, coughing.   Cold air gives her sob. No SOB today. No leg swelling. No left arm pain or numbness. Never smoker. No lung disease.   Pending CT sinus with ENT.  Seen by ENT, Victorino DikeJennifer NP. Pending Ct sinus.     Cardiology Dr Gwen PoundsKowalski 06/2016 for CP, sob. Felt like MS in nature. No further intervention.  ED 01/2018 for CP, SOB. D dimer negative. Troponin x 2 negative.  History of hematuria, status post cystoscopy with Dr. Richardo HanksSninsky, urology. Normal 02/2018.   HISTORY:  Past Medical History:  Diagnosis Date  . Asthma   . Chronic pelvic pain in female 2018  . Cystitis   . Cystocele   . Endometrial polyp    . Endometriosis    per pt report but never seen during surg  . Frequent headaches   . Gastritis   . GERD (gastroesophageal reflux disease)    rare  . Gross hematuria   . History of kidney stones   . Microscopic hematuria   . Migraine   . Migraines   . Osteopenia   . Urinary disorder   . UTI (lower urinary tract infection)    Past Surgical History:  Procedure Laterality Date  . COLONOSCOPY N/A 11/05/2014   Procedure: COLONOSCOPY;  Surgeon: Scot Junobert T Elliott, MD;  [facility];  Service: Endoscopy;  Laterality: N/A;  . COLONOSCOPY  11/2016   Dr. Mechele CollinElliott  . CYSTOSCOPY  2007   with biopsy   . CYSTOSCOPY N/A 06/01/2016   Procedure: CYSTOSCOPY;  Surgeon: Vena AustriaAndreas Staebler, MD;  Location: [facility] ORS;  Service: Gynecology;  Laterality: N/A;  . DIAGNOSTIC LAPAROSCOPY    . DILATATION & CURETTAGE/HYSTEROSCOPY WITH MYOSURE N/A 04/13/2015   Procedure: DILATATION & CURETTAGE/HYSTEROSCOPY WITH MYOSURE/POLYPECTOMY;  Surgeon: Elenora Fenderhelsea C Ward, MD;  Location: [facility] ORS;  Service: Gynecology;  Laterality: N/A;  . DILATATION & CURETTAGE/HYSTEROSCOPY WITH MYOSURE N/A 06/01/2016   Procedure: DILATATION & CURETTAGE/HYSTEROSCOPY WITH MYOSURE;  Surgeon: Vena AustriaAndreas Staebler, MD;  Location: [facility] ORS;  Service: Gynecology;  Laterality: N/A;  . GUM SURGERY    . laproscopy  2007  . POLYPECTOMY     endometrial   Family History  Problem Relation Age of Onset  . Colon cancer Father 2  . Arthritis Father   . Hyperlipidemia Father   . Transient ischemic attack Father   . Cancer - Colon Father 5  . Prostate cancer Father 72  . Arthritis Mother   . Heart disease Mother   . Stroke Mother        TIA  . Hypertension Mother   . Diabetes Mother   . Kidney cancer Mother 1  . Cancer Maternal Grandmother   . Cancer Paternal Grandfather        prostate  . Breast cancer Cousin 35    Allergies: Lac bovis; Milk-related compounds; Omeprazole; and Penicillins Current Outpatient Medications on File Prior to  Visit  Medication Sig Dispense Refill  . Calcium Carbonate-Vit D-Min (CALCIUM 1200 PO) Take 2-3 tablets by mouth daily. Reported on 05/26/2015    . clindamycin (CLEOCIN) 300 MG capsule Take 1 capsule (300 mg total) by mouth 3 (three) times daily. 30 capsule 0  . docusate sodium (COLACE) 100 MG capsule Take 1 capsule by mouth daily as needed.     Marland Kitchen levonorgestrel-ethinyl estradiol (JOLESSA) 0.15-0.03 MG tablet Take 1 tablet by mouth daily. 1 Package 3  . traMADol (ULTRAM) 50 MG tablet Take 1 tablet (50 mg total) by mouth 3 (three) times daily as needed. 20 tablet 0   No current facility-administered medications on file prior to visit.     Social History   Tobacco Use  . Smoking status: Never Smoker  . Smokeless tobacco: Never Used  Substance Use Topics  . Alcohol use: No    Alcohol/week: 0.0 standard drinks  . Drug use: No    Review of Systems  Constitutional: Negative for chills and fever.  HENT: Negative for congestion.   Eyes: Negative for photophobia and visual disturbance.  Respiratory: Positive for shortness of breath. Negative for cough.   Cardiovascular: Positive for chest pain. Negative for palpitations.  Gastrointestinal: Negative for nausea and vomiting.  Genitourinary: Negative for dysuria, hematuria and urgency.  Musculoskeletal: Positive for back pain.  Neurological: Positive for headaches. Negative for dizziness.      Objective:    BP 124/90 (BP Location: Left Arm, Patient Position: Sitting, Cuff Size: Normal)   Pulse 60   Temp 98 F (36.7 C)   Wt 148 lb 12.8 oz (67.5 kg)   SpO2 95%   BMI 21.35 kg/m    Physical Exam Vitals signs reviewed.  Constitutional:      Appearance: She is well-developed.  HENT:     Head: Normocephalic and atraumatic.     Right Ear: Hearing, tympanic membrane, ear canal and external ear normal. No decreased hearing noted. No drainage, swelling or tenderness. No middle ear effusion. No foreign body. Tympanic membrane is not  erythematous or bulging.     Left Ear: Hearing, tympanic membrane, ear canal and external ear normal. No decreased hearing noted. No drainage, swelling or tenderness.  No middle ear effusion. No foreign body. Tympanic membrane is not erythematous or bulging.     Nose: No rhinorrhea.     Right Sinus: Frontal sinus tenderness present. No maxillary sinus tenderness.     Left Sinus: Frontal sinus tenderness present. No maxillary sinus tenderness.     Mouth/Throat:     Pharynx: Uvula midline. No oropharyngeal exudate or posterior oropharyngeal erythema.  Tonsils: No tonsillar abscesses.  Eyes:     Conjunctiva/sclera: Conjunctivae normal.  Cardiovascular:     Rate and Rhythm: Normal rate and regular rhythm.     Pulses: Normal pulses.     Heart sounds: Normal heart sounds.  Pulmonary:     Effort: Pulmonary effort is normal.     Breath sounds: Normal breath sounds. No wheezing, rhonchi or rales.  Chest:     Chest wall: No tenderness.  Musculoskeletal:     Lumbar back: She exhibits normal range of motion, no tenderness, no bony tenderness, no swelling, no edema, no pain and no spasm.       Back:     Comments: Full range of motion with flexion, tension, lateral side bends. No bony tenderness. No pain, numbness, tingling elicited with single leg raise bilaterally.   Area of pain as described by patient marked on diagram  Lymphadenopathy:     Head:     Right side of head: No submental, submandibular, tonsillar, preauricular, posterior auricular or occipital adenopathy.     Left side of head: No submental, submandibular, tonsillar, preauricular, posterior auricular or occipital adenopathy.     Cervical: No cervical adenopathy.  Skin:    General: Skin is warm and dry.  Neurological:     Mental Status: She is alert.     Sensory: No sensory deficit.     Deep Tendon Reflexes:     Reflex Scores:      Patellar reflexes are 2+ on the right side and 2+ on the left side.    Comments: Sensation  and strength intact bilateral lower extremities.  Psychiatric:        Speech: Speech normal.        Behavior: Behavior normal.        Thought Content: Thought content normal.        Assessment & Plan:   Problem List Items Addressed This Visit      Other   Frontal headache - Primary    Suspect related to sinus congestion. Advised trial of clindamycin ( prescribed by Letvak) with probiotics due to risk of c difficile.  Emphasized importance of scheduling CT sinus with ear nose and throat.  Patient will continue to follow with ear nose and throat      Cloudy urine    No dysuria.  few leukocytes, blood seen on urine dipstick.  Pending urine culture prior to starting  treatment..  She has persistent hematuria, established with urology.  Emphasized the importance to continue to follow with them       Relevant Orders   POCT Urinalysis Dipstick (Completed)   Urine Culture (Completed)   Urine Microscopic Only (Completed)   Chest pain    ongoing for several months.  She states is unchanged, does not feel different when she was evaluated emergency room October 2019.   EKG does not show any acute ischemia.  No significant changes from prior EKGs done October 2019 or August 2017.  EKG reviewed with supervising, Dr Duncan Dulleresa Tullo, and she and I jointly agreed on management plan.  I also spoke with cardiologist on-call, Dr. Kirke CorinArida in regards to EKG presentation.  He felt routine follow-up with cardiology would be likely appropriate.   Counseled patient on symptoms which look out for, she understands if pain in chest were to increase or new symptoms develop, she would need to call 911.      Relevant Orders   EKG 12-Lead (Completed)   Ambulatory referral to Cardiology  I am having Talin Rozeboom maintain her Calcium Carbonate-Vit D-Min (CALCIUM 1200 PO), docusate sodium, levonorgestrel-ethinyl estradiol, clindamycin, and traMADol.   No orders of the defined types were placed in this  encounter.   Return precautions given.   Risks, benefits, and alternatives of the medications and treatment plan prescribed today were discussed, and patient expressed understanding.   Education regarding symptom management and diagnosis given to patient on AVS.  Continue to follow with [name], FNP for routine health maintenance.   Cristina Corpus and I agreed with plan.   Rennie Plowman, FNP

## 2018-04-30 LAB — URINE CULTURE
MICRO NUMBER: 46616
SPECIMEN QUALITY:: ADEQUATE

## 2018-04-30 LAB — URINALYSIS, MICROSCOPIC ONLY: WBC, UA: NONE SEEN (ref 0–?)

## 2018-05-01 NOTE — Assessment & Plan Note (Addendum)
ongoing for several months.  She states is unchanged, does not feel different when she was evaluated emergency room October 2019.   EKG does not show any acute ischemia.  No significant changes from prior EKGs done October 2019 or August 2017.  EKG reviewed with supervising, Dr Duncan Dull, and she and I jointly agreed on management plan.  I also spoke with cardiologist on-call, Dr. Kirke Corin in regards to EKG presentation.  He felt routine follow-up with cardiology would be likely appropriate.   Counseled patient on symptoms which look out for, she understands if pain in chest were to increase or new symptoms develop, she would need to call 911.

## 2018-05-07 ENCOUNTER — Ambulatory Visit: Payer: BC Managed Care – PPO | Admitting: Urology

## 2018-06-03 ENCOUNTER — Ambulatory Visit (INDEPENDENT_AMBULATORY_CARE_PROVIDER_SITE_OTHER): Payer: BC Managed Care – PPO

## 2018-06-03 ENCOUNTER — Encounter: Payer: Self-pay | Admitting: Family

## 2018-06-03 ENCOUNTER — Ambulatory Visit: Payer: BC Managed Care – PPO | Admitting: Family

## 2018-06-03 VITALS — BP 122/70 | HR 73 | Temp 97.7°F | Wt 152.8 lb

## 2018-06-03 DIAGNOSIS — Z3202 Encounter for pregnancy test, result negative: Secondary | ICD-10-CM | POA: Diagnosis not present

## 2018-06-03 DIAGNOSIS — G44209 Tension-type headache, unspecified, not intractable: Secondary | ICD-10-CM

## 2018-06-03 DIAGNOSIS — M545 Low back pain, unspecified: Secondary | ICD-10-CM

## 2018-06-03 DIAGNOSIS — R51 Headache: Secondary | ICD-10-CM

## 2018-06-03 DIAGNOSIS — R519 Headache, unspecified: Secondary | ICD-10-CM

## 2018-06-03 LAB — POCT URINE PREGNANCY: Preg Test, Ur: NEGATIVE

## 2018-06-03 MED ORDER — MAGNESIUM CITRATE 200 MG PO TABS: 400.0000 mg | ORAL_TABLET | Freq: Every day | ORAL | 1 refills | Status: DC

## 2018-06-03 MED ORDER — NAPROXEN SODIUM 550 MG PO TABS: ORAL_TABLET | ORAL | 1 refills | Status: DC

## 2018-06-03 MED ORDER — LIDOCAINE 5 % EX PTCH: 1.0000 | MEDICATED_PATCH | CUTANEOUS | 0 refills | Status: DC

## 2018-06-03 NOTE — Patient Instructions (Signed)
I suspect her headache may be a chronic tension type headache.  We will start with magnesium citrate for prevention for this.  As directed, you may need naproxen sodium as a rescue therapy. Take with food  I suspect you are having some low back pain, musculoskeletal in etiology.  You may use lidocaine patch, you may also try some topical over-the-counter creams such as Aspercreme for pain.  You may also apply this to the back of your neck as we discussed as well  Today we discussed referrals, orders. MRI   I have placed these orders in the system for you.  Please be sure to give [place] a call if you have not heard from our office regarding this. We should hear from [place] within ONE week with information regarding your appointment. If not, please let me know immediately.

## 2018-06-03 NOTE — Assessment & Plan Note (Addendum)
Doesn't appear migrainous. Appears to be more tension type. Normal neurologic exam.  I have given naproxen for rescue therapy.  Patient will also try magnesium to see if this helps for prevention.  Pending MRI brain.  She has neurology appointment next month.

## 2018-06-03 NOTE — Progress Notes (Signed)
Subjective:    Patient ID: Cristina Wade, female    DOB: [birth year], 48 y.o.   MRN: [id number]  CC: Cristina Wade is a 48 y.o. female who presents today for an acute visit.    HPI: HA for more than 2 months, improved.  Occurring most days. Describes throb. Sometimes posterior and sometimes frontal. No HA today. Had a HA earlier today. Doesn't wake up with HA, and not awaken with HA. No vomiting with HA. Not light or sound sensitive.   Has taken ibuprofen sparingly. Has tried OTC sinus medication.  Has taken tramadol but didn't like the pain.   Never had CT Sinus due to insurance. NO congestion, fever, vision changes or dimming of vision, hearing changes, tinnitis, vertigo, syncope.   Appointment with Dr Sherryll Burger March 18th.   Pelvic pain which radiates to left low back pain, one week, improving. Normal BM. No diarrhea, dysuria.  Does not appear to be exacerbated by movement, meals.   H/o endometriosis. Regular cycles every 3 months, following with GYN. On [place]. Doesn't feel like endometriosis.  Thinks posture may be contributory.   Intermittent numbness from toes to mid calf, 'it is random.' First noticed last 2 months.   Standing a lot at work.      Normal pelvic US 09/2017 HISTORY:  Past Medical History:  Diagnosis Date  . Asthma   . Chronic pelvic pain in female 2018  . Cystitis   . Cystocele   . Endometrial polyp   . Endometriosis    per pt report but never seen during surg  . Frequent headaches   . Gastritis   . GERD (gastroesophageal reflux disease)    rare  . Gross hematuria   . History of kidney stones   . Microscopic hematuria   . Migraine   . Migraines   . Osteopenia   . Urinary disorder   . UTI (lower urinary tract infection)    Past Surgical History:  Procedure Laterality Date  . COLONOSCOPY N/A 11/05/2014   Procedure: COLONOSCOPY;  Surgeon: Scot Jun, MD;  Location: [facility] ENDOSCOPY;  Service: Endoscopy;  Laterality: N/A;  . COLONOSCOPY  11/2016   Dr. Mechele Collin  . CYSTOSCOPY  2007   with biopsy   . CYSTOSCOPY N/A 06/01/2016   Procedure: CYSTOSCOPY;  Surgeon: Vena Austria, MD;  Location: [facility] ORS;  Service: Gynecology;  Laterality: N/A;  . DIAGNOSTIC LAPAROSCOPY    . DILATATION & CURETTAGE/HYSTEROSCOPY WITH MYOSURE N/A 04/13/2015   Procedure: DILATATION & CURETTAGE/HYSTEROSCOPY WITH MYOSURE/POLYPECTOMY;  Surgeon: Elenora Fender Ward, MD;  Location: [facility] ORS;  Service: Gynecology;  Laterality: N/A;  . DILATATION & CURETTAGE/HYSTEROSCOPY WITH MYOSURE N/A 06/01/2016   Procedure: DILATATION & CURETTAGE/HYSTEROSCOPY WITH MYOSURE;  Surgeon: Vena Austria, MD;  Location: [facility] ORS;  Service: Gynecology;  Laterality: N/A;  . GUM SURGERY    . laproscopy  2007  . POLYPECTOMY     endometrial   Family History  Problem Relation Age of Onset  . Colon cancer Father 72  . Arthritis Father   . Hyperlipidemia Father   . Transient ischemic attack Father   . Cancer - Colon Father 67  . Prostate cancer Father 75  . Arthritis Mother   . Heart disease Mother   . Stroke Mother        TIA  . Hypertension Mother   . Diabetes Mother   . Kidney cancer Mother 38  . Cancer Maternal Grandmother   . Cancer Paternal Grandfather  prostate  . Breast cancer Cousin 35    Allergies: Lac bovis; Milk-related compounds; Omeprazole; and Penicillins Current Outpatient Medications on File Prior to Visit  Medication Sig Dispense Refill  . Calcium Carbonate-Vit D-Min (CALCIUM 1200 PO) Take 2-3 tablets by mouth daily. Reported on 05/26/2015    . docusate sodium (COLACE) 100 MG capsule Take 1 capsule by mouth daily as needed.     Marland Kitchen levonorgestrel-ethinyl estradiol (JOLESSA) 0.15-0.03 MG tablet Take 1 tablet by mouth daily. 1 Package 3  . traMADol (ULTRAM) 50 MG tablet Take 1 tablet (50 mg total) by mouth 3 (three) times daily as needed. 20 tablet 0   No current facility-administered medications on file prior to visit.     Social History   Tobacco Use  .  Smoking status: Never Smoker  . Smokeless tobacco: Never Used  Substance Use Topics  . Alcohol use: No    Alcohol/week: 0.0 standard drinks  . Drug use: No    Review of Systems  Constitutional: Negative for chills and fever.  HENT: Negative for congestion, dental problem and sinus pain.   Eyes: Negative for photophobia and visual disturbance.  Respiratory: Negative for cough.   Cardiovascular: Negative for chest pain and palpitations.  Gastrointestinal: Negative for abdominal distention, abdominal pain, nausea and vomiting.  Genitourinary: Positive for pelvic pain. Negative for dysuria, flank pain and vaginal bleeding.  Musculoskeletal: Positive for back pain.  Neurological: Positive for numbness and headaches. Negative for dizziness.      Objective:    BP 122/70 (BP Location: Left Arm, Patient Position: Sitting, Cuff Size: Large)   Pulse 73   Temp 97.7 F (36.5 C)   Wt 152 lb 12.8 oz (69.3 kg)   SpO2 96%   BMI 21.92 kg/m    Physical Exam Vitals signs reviewed.  Constitutional:      Appearance: Normal appearance. She is well-developed.  HENT:     Mouth/Throat:     Pharynx: Uvula midline.  Eyes:     Conjunctiva/sclera: Conjunctivae normal.     Pupils: Pupils are equal, round, and reactive to light.     Comments: Fundus normal bilaterally.   Cardiovascular:     Rate and Rhythm: Normal rate and regular rhythm.     Pulses: Normal pulses.     Heart sounds: Normal heart sounds.  Pulmonary:     Effort: Pulmonary effort is normal.     Breath sounds: Normal breath sounds. No wheezing, rhonchi or rales.  Abdominal:     General: Bowel sounds are normal. There is no distension.     Palpations: Abdomen is soft. Abdomen is not rigid. There is no fluid wave or mass.     Tenderness: There is no abdominal tenderness. There is no guarding or rebound.     Comments: No pelvic pain.   Musculoskeletal:     Left hip: She exhibits normal range of motion, normal strength, no tenderness  and no swelling.     Lumbar back: She exhibits normal range of motion and no tenderness.       Back:     Comments: Left  Hip: No limp or waddling gait. Full ROM with flexion and hip rotation in flexion.    No pain of lateral hip with  (flexion-abduction-external rotation) test.   No pain with deep palpation of greater trochanter.    Skin:    General: Skin is warm and dry.  Neurological:     Mental Status: She is alert.     Cranial  Nerves: No cranial nerve deficit.     Sensory: No sensory deficit.     Deep Tendon Reflexes:     Reflex Scores:      Bicep reflexes are 2+ on the right side and 2+ on the left side.      Patellar reflexes are 2+ on the right side and 2+ on the left side.    Comments: Grip equal and strong bilateral upper extremities. Gait strong and steady. Able to perform rapid alternating movement without difficulty.   No numbness bilateral lower extremities.  Psychiatric:        Speech: Speech normal.        Behavior: Behavior normal.        Thought Content: Thought content normal.        Assessment & Plan:   Problem List Items Addressed This Visit      Other   Frontal headache - Primary    Doesn't appear migrainous. Appears to be more tension type. Normal neurologic exam.  I have given naproxen for rescue therapy.  Patient will also try magnesium to see if this helps for prevention.  Pending MRI brain.  She has neurology appointment next month.      Relevant Medications   naproxen sodium (ANAPROX) 550 MG tablet   Magnesium Citrate 200 MG TABS   Other Relevant Orders   MR Brain W Wo Contrast   CBC with Differential/Platelet (Completed)   Comprehensive metabolic panel (Completed)   Acute left-sided low back pain    Normal pelvic, abdominal exam.  Improving. Suspect musculoskeletal etiology.  Reasonable to try lidocaine patches.  She declines repeat pelvic ultrasound.  No neuropathy appreciated on exam, pending lab evaluation.  Patient will let me know if  not better with conservative therapy.      Relevant Medications   naproxen sodium (ANAPROX) 550 MG tablet   lidocaine (LIDODERM) 5 %   Other Relevant Orders   B12 and Folate Panel (Completed)   TSH (Completed)   Hemoglobin A1c (Completed)   Urinalysis   DG Lumbar Spine Complete (Completed)    Other Visit Diagnoses    Tension-type headache, not intractable, unspecified chronicity pattern       Relevant Medications   naproxen sodium (ANAPROX) 550 MG tablet   Other Relevant Orders   MR Brain W Wo Contrast   Negative pregnancy test       Relevant Orders   POCT urine pregnancy (Completed)         I have discontinued Cristina Wade's clindamycin. I am also having her start on naproxen sodium, Magnesium Citrate, and lidocaine. Additionally, I am having her maintain her Calcium Carbonate-Vit D-Min (CALCIUM 1200 PO), docusate sodium, levonorgestrel-ethinyl estradiol, and traMADol.   Meds ordered this encounter  Medications  . naproxen sodium (ANAPROX) 550 MG tablet    Sig: Take at earliest onset of headache. May repeat dose in 12 hours if needed. Do not exceed two tablets in 24 hours. Take with food    Dispense:  30 tablet    Refill:  1    Order Specific Question:   Supervising Provider    Answer:   Darrick HuntsmanULLO, TERESA L [[id number]]  . Magnesium Citrate 200 MG TABS    Sig: Take 400 mg by mouth daily.    Dispense:  120 tablet    Refill:  1    Order Specific Question:   Supervising Provider    Answer:   Duncan DullULLO, TERESA L [[id number]]  . lidocaine (LIDODERM) 5 %  Sig: Place 1 patch onto the skin daily. Remove & Discard patch within 12 hours.    Dispense:  30 patch    Refill:  0    Order Specific Question:   Supervising Provider    Answer:   Sherlene Shams [[id number]]    Return precautions given.   Risks, benefits, and alternatives of the medications and treatment plan prescribed today were discussed, and patient expressed understanding.   Education regarding symptom management and diagnosis  given to patient on AVS.  Continue to follow with [name], FNP for routine health maintenance.   [name] and I agreed with plan.   Rennie Plowman, FNP

## 2018-06-03 NOTE — Assessment & Plan Note (Addendum)
Normal pelvic, abdominal exam.  Improving. Suspect musculoskeletal etiology.  Reasonable to try lidocaine patches.  She declines repeat pelvic ultrasound.  No neuropathy appreciated on exam, pending lab evaluation.  Patient will let me know if not better with conservative therapy.

## 2018-06-04 LAB — COMPREHENSIVE METABOLIC PANEL
ALT: 11 U/L (ref 0–35)
AST: 13 U/L (ref 0–37)
Albumin: 4.2 g/dL (ref 3.5–5.2)
Alkaline Phosphatase: 31 U/L — ABNORMAL LOW (ref 39–117)
BUN: 14 mg/dL (ref 6–23)
CO2: 26 mEq/L (ref 19–32)
Calcium: 9.2 mg/dL (ref 8.4–10.5)
Chloride: 103 mEq/L (ref 96–112)
Creatinine, Ser: 0.76 mg/dL (ref 0.40–1.20)
GFR: 80.64 mL/min (ref >60.00)
Glucose, Bld: 75 mg/dL (ref 70–99)
POTASSIUM: 3.9 meq/L (ref 3.5–5.1)
Sodium: 137 mEq/L (ref 135–145)
Total Bilirubin: 0.5 mg/dL (ref 0.2–1.2)
Total Protein: 7.1 g/dL (ref 6.0–8.3)

## 2018-06-04 LAB — HEMOGLOBIN A1C: Hgb A1c MFr Bld: 5.4 % (ref 4.6–6.5)

## 2018-06-04 LAB — URINALYSIS, ROUTINE W REFLEX MICROSCOPIC
Bilirubin Urine: NEGATIVE
Ketones, ur: NEGATIVE
Nitrite: NEGATIVE
Total Protein, Urine: NEGATIVE
Urine Glucose: NEGATIVE
Urobilinogen, UA: 0.2 (ref 0.0–1.0)
pH: 6.5 (ref 5.0–8.0)

## 2018-06-04 LAB — CBC WITH DIFFERENTIAL/PLATELET
BASOS PCT: 0.7 % (ref 0.0–3.0)
Basophils Absolute: 0.1 10*3/uL (ref 0.0–0.1)
Eosinophils Absolute: 0.1 10*3/uL (ref 0.0–0.7)
Eosinophils Relative: 0.9 % (ref 0.0–5.0)
HCT: 40.3 % (ref 36.0–46.0)
HEMOGLOBIN: 13.5 g/dL (ref 12.0–15.0)
Lymphocytes Relative: 27.9 % (ref 12.0–46.0)
Lymphs Abs: 2 10*3/uL (ref 0.7–4.0)
MCHC: 33.4 g/dL (ref 30.0–36.0)
MCV: 90.7 fl (ref 78.0–100.0)
Monocytes Absolute: 0.6 10*3/uL (ref 0.1–1.0)
Monocytes Relative: 7.7 % (ref 3.0–12.0)
Neutro Abs: 4.6 10*3/uL (ref 1.4–7.7)
Neutrophils Relative %: 62.8 % (ref 43.0–77.0)
Platelets: 245 10*3/uL (ref 150.0–400.0)
RBC: 4.44 Mil/uL (ref 3.87–5.11)
RDW: 13.7 % (ref 11.5–15.5)
WBC: 7.3 10*3/uL (ref 4.0–10.5)

## 2018-06-04 LAB — TSH: TSH: 1.46 u[IU]/mL (ref 0.35–4.50)

## 2018-06-04 LAB — B12 AND FOLATE PANEL
Folate: 11.2 ng/mL (ref >5.9)
VITAMIN B 12: 118 pg/mL — AB (ref 211–911)

## 2018-06-05 ENCOUNTER — Telehealth: Payer: Self-pay | Admitting: Family

## 2018-06-05 ENCOUNTER — Other Ambulatory Visit: Payer: Self-pay | Admitting: Family

## 2018-06-05 DIAGNOSIS — E538 Deficiency of other specified B group vitamins: Secondary | ICD-10-CM

## 2018-06-05 MED ORDER — CYANOCOBALAMIN 1000 MCG PO CAPS: 1.0000 | ORAL_CAPSULE | Freq: Every day | ORAL | 3 refills | Status: AC

## 2018-06-05 NOTE — Telephone Encounter (Signed)
Provided lab  Results  Per  Rennie Plowman, FNP  06/03/18.  Patient  Voiced  Understanding.

## 2018-06-13 ENCOUNTER — Encounter: Payer: Self-pay | Admitting: Family Medicine

## 2018-06-13 ENCOUNTER — Ambulatory Visit: Payer: BC Managed Care – PPO | Admitting: Family Medicine

## 2018-06-13 ENCOUNTER — Ambulatory Visit
Admission: RE | Admit: 2018-06-13 | Discharge: 2018-06-13 | Disposition: A | Discharge status: Home / Self Care | Payer: BC Managed Care – PPO | Source: Ambulatory Visit | Attending: Family | Admitting: Family

## 2018-06-13 VITALS — BP 108/80 | HR 67 | Temp 98.0°F | Resp 16 | Ht 70.0 in | Wt 152.6 lb

## 2018-06-13 DIAGNOSIS — G44209 Tension-type headache, unspecified, not intractable: Secondary | ICD-10-CM | POA: Insufficient documentation

## 2018-06-13 DIAGNOSIS — R109 Unspecified abdominal pain: Secondary | ICD-10-CM | POA: Diagnosis not present

## 2018-06-13 DIAGNOSIS — R3129 Other microscopic hematuria: Secondary | ICD-10-CM | POA: Diagnosis not present

## 2018-06-13 DIAGNOSIS — N390 Urinary tract infection, site not specified: Secondary | ICD-10-CM

## 2018-06-13 DIAGNOSIS — R319 Hematuria, unspecified: Secondary | ICD-10-CM

## 2018-06-13 DIAGNOSIS — R51 Headache: Secondary | ICD-10-CM | POA: Diagnosis not present

## 2018-06-13 DIAGNOSIS — R519 Headache, unspecified: Secondary | ICD-10-CM

## 2018-06-13 LAB — POCT URINALYSIS DIPSTICK
Bilirubin, UA: NEGATIVE
Glucose, UA: NEGATIVE
Ketones, UA: NEGATIVE
Leukocytes, UA: NEGATIVE
Nitrite, UA: NEGATIVE
Protein, UA: NEGATIVE
Spec Grav, UA: 1.015 (ref 1.010–1.025)
Urobilinogen, UA: 0.2 E.U./dL
pH, UA: 8.5 — AB (ref 5.0–8.0)

## 2018-06-13 MED ORDER — GADOBUTROL 1 MMOL/ML IV SOLN
6.0000 mL | Freq: Once | INTRAVENOUS | Status: AC | PRN
  Administered 2018-06-13: 6 mL via INTRAVENOUS

## 2018-06-13 NOTE — Progress Notes (Signed)
Subjective:    Patient ID: Cristina Wade, female    DOB: January 22, 1971, 48 y.o.   MRN: [id number]  HPI  Patient presents to clinic complaining of bilateral flank pain, left side worse than right for about a week and a half.  Patient does report a history of kidney stones, and states she has been told in the past that she had multiple small stones in kidneys.   Does have some increased urinary frequency, but no burning or pain with urination.  No nausea, vomiting or diarrhea.  No fever or chills.  Patient did have menstrual cycle that began 5 days ago and ended yesterday.   Recent lab work reviewed that was done on 06/03/2018: CBC Latest Ref Rng & Units 06/03/2018 02/02/2018 11/29/2016  WBC 4.0 - 10.5 K/uL 7.3 6.3 5.6  Hemoglobin 12.0 - 15.0 g/dL 61.6 07.3 71.0  Hematocrit 36.0 - 46.0 % 40.3 39.6 40.0  Platelets 150.0 - 400.0 K/uL 245.0 244 239.0   BMP Latest Ref Rng & Units 06/03/2018 02/02/2018 11/29/2016  Glucose 70 - 99 mg/dL 75 99 92  BUN 6 - 23 mg/dL 14 10 15   Creatinine 0.40 - 1.20 mg/dL 6.26 9.48 5.46  Sodium 135 - 145 mEq/L 137 141 138  Potassium 3.5 - 5.1 mEq/L 3.9 3.9 5.0  Chloride 96 - 112 mEq/L 103 107 105  CO2 19 - 32 mEq/L 26 26 29   Calcium 8.4 - 10.5 mg/dL 9.2 9.3 9.5     Patient Active Problem List   Diagnosis Date Noted  . Acute left-sided low back pain 06/03/2018  . Cloudy urine 04/29/2018  . Chest pain 04/29/2018  . Frontal headache 04/27/2018  . Localized, primary osteoarthritis 02/07/2018  . Pelvic pain 01/30/2018  . Slow transit constipation 10/10/2017  . Bilateral impacted cerumen 11/28/2016  . Chronic bilateral low back pain without sciatica 11/28/2016  . Precordial pain 07/07/2016  . Right bundle branch block (RBBB) on electrocardiogram (ECG) 07/07/2016  . Chest tightness 06/27/2016  . Preventative health care 09/30/2015  . Encounter to establish care 09/28/2014  . Vestibular migraine 09/28/2014  . Encounter for screening colonoscopy 11/21/2013    Social History   Tobacco Use  . Smoking status: Never Smoker  . Smokeless tobacco: Never Used  Substance Use Topics  . Alcohol use: No    Alcohol/week: 0.0 standard drinks    Review of Systems   Constitutional: Negative for chills, fatigue and fever.  HENT: Negative for congestion, ear pain, sinus pain and sore throat.   Eyes: Negative.   Respiratory: Negative for cough, shortness of breath and wheezing.   Cardiovascular: Negative for chest pain, palpitations and leg swelling.  Gastrointestinal: Negative for abdominal pain, diarrhea, nausea and vomiting.  Genitourinary: Negative for dysuria, and urgency. +increased frequency Musculoskeletal: +flank pain, left more than right.  Skin: Negative for color change, pallor and rash.  Neurological: Negative for syncope, light-headedness and headaches.  Psychiatric/Behavioral: The patient is not nervous/anxious.       Objective:   Physical Exam Vitals signs and nursing note reviewed.  Constitutional:      General: She is not in acute distress.    Appearance: She is not ill-appearing, toxic-appearing or diaphoretic.  HENT:     Head: Normocephalic and atraumatic.  Eyes:     General: No scleral icterus.    Extraocular Movements: Extraocular movements intact.     Pupils: Pupils are equal, round, and reactive to light.  Neck:     Musculoskeletal: Neck supple. No neck rigidity.  Cardiovascular:     Rate and Rhythm: Normal rate and regular rhythm.  Pulmonary:     Effort: Pulmonary effort is normal. No respiratory distress.     Breath sounds: Normal breath sounds.  Abdominal:     General: Bowel sounds are normal. There is no distension.     Palpations: Abdomen is soft.     Tenderness: There is no abdominal tenderness. There is right CVA tenderness and left CVA tenderness (more tender on left).  Skin:    General: Skin is warm and dry.     Coloration: Skin is not jaundiced or pale.     Findings: No erythema.  Neurological:      Mental Status: She is alert and oriented to person, place, and time.     Gait: Gait normal.  Psychiatric:        Mood and Affect: Mood normal.        Behavior: Behavior normal.     Vitals:   06/13/18 1435  BP: 108/80  Pulse: 67  Resp: 16  Temp: 98 F (36.7 C)  SpO2: 99%       Assessment & Plan:    Bilateral flank pain/micro hematuria - due to patient's past history of kidney stone, flank pain and microscopic hematuria (could be from recent menses) on urinalysis dipstick suspect possibly kidney stone could be culprit of pain.  Most recent lab work does show normal kidney functions, patient declines repeat lab work today.  Urinalysis is otherwise negative for UTI.  Patient does not appear in extreme distress at this time.  We will set up CT scan to assess kidneys and rule out kidney stone.  Urine culture also sent to lab to confirm or deny presence of UTI.  In the meantime patient advised to keep up water intake and monitor cell for any worsening of symptoms.  Patient advised that if symptoms do worsen to call office right away and let us know.  Patient aware she will be contacted in regards to her CT scan appointment

## 2018-06-15 LAB — URINE CULTURE
MICRO NUMBER:: 251140
SPECIMEN QUALITY:: ADEQUATE

## 2018-06-15 MED ORDER — CLINDAMYCIN HCL 300 MG PO CAPS: 300.0000 mg | ORAL_CAPSULE | Freq: Three times a day (TID) | ORAL | 0 refills | Status: AC

## 2018-06-15 NOTE — Addendum Note (Signed)
Addended by: Leanora Cover on: 06/15/2018 11:50 AM   Modules accepted: Orders

## 2018-06-22 ENCOUNTER — Emergency Department
Admission: EM | Admit: 2018-06-22 | Discharge: 2018-06-22 | Disposition: A | Discharge status: Home / Self Care | Payer: BC Managed Care – PPO | Attending: Emergency Medicine | Admitting: Emergency Medicine

## 2018-06-22 ENCOUNTER — Other Ambulatory Visit: Payer: Self-pay

## 2018-06-22 ENCOUNTER — Encounter: Payer: Self-pay | Admitting: Emergency Medicine

## 2018-06-22 DIAGNOSIS — J45909 Unspecified asthma, uncomplicated: Secondary | ICD-10-CM | POA: Diagnosis not present

## 2018-06-22 DIAGNOSIS — R102 Pelvic and perineal pain: Secondary | ICD-10-CM | POA: Diagnosis present

## 2018-06-22 DIAGNOSIS — N3090 Cystitis, unspecified without hematuria: Secondary | ICD-10-CM | POA: Insufficient documentation

## 2018-06-22 DIAGNOSIS — Z79899 Other long term (current) drug therapy: Secondary | ICD-10-CM | POA: Diagnosis not present

## 2018-06-22 LAB — URINALYSIS, COMPLETE (UACMP) WITH MICROSCOPIC
Bacteria, UA: NONE SEEN
Bilirubin Urine: NEGATIVE
Glucose, UA: NEGATIVE mg/dL
Ketones, ur: NEGATIVE mg/dL
Nitrite: NEGATIVE
Protein, ur: NEGATIVE mg/dL
Specific Gravity, Urine: 1.009 (ref 1.005–1.030)
pH: 8 (ref 5.0–8.0)

## 2018-06-22 LAB — CBC
HCT: 41.6 % (ref 36.0–46.0)
Hemoglobin: 13.6 g/dL (ref 12.0–15.0)
MCH: 29.9 pg (ref 26.0–34.0)
MCHC: 32.7 g/dL (ref 30.0–36.0)
MCV: 91.4 fL (ref 80.0–100.0)
Platelets: 226 10*3/uL (ref 150–400)
RBC: 4.55 MIL/uL (ref 3.87–5.11)
RDW: 13 % (ref 11.5–15.5)
WBC: 7 10*3/uL (ref 4.0–10.5)
nRBC: 0 % (ref 0.0–0.2)

## 2018-06-22 LAB — BASIC METABOLIC PANEL
Anion gap: 7 (ref 5–15)
BUN: 11 mg/dL (ref 6–20)
CO2: 26 mmol/L (ref 22–32)
Calcium: 9.1 mg/dL (ref 8.9–10.3)
Chloride: 106 mmol/L (ref 98–111)
Creatinine, Ser: 0.7 mg/dL (ref 0.44–1.00)
GFR calc Af Amer: >60 mL/min (ref >60)
Glucose, Bld: 85 mg/dL (ref 70–99)
POTASSIUM: 4.3 mmol/L (ref 3.5–5.1)
Sodium: 139 mmol/L (ref 135–145)

## 2018-06-22 LAB — POCT PREGNANCY, URINE: Preg Test, Ur: NEGATIVE

## 2018-06-22 MED ORDER — CEFDINIR 300 MG PO CAPS: 300.0000 mg | ORAL_CAPSULE | Freq: Two times a day (BID) | ORAL | 0 refills | Status: DC

## 2018-06-22 MED ORDER — CEFDINIR 300 MG PO CAPS
300.0000 mg | ORAL_CAPSULE | ORAL | Status: DC
  Filled 2018-06-22: qty 1

## 2018-06-22 NOTE — ED Provider Notes (Signed)
[facility] Emergency Department Provider Note  ____________________________________________  Time seen: Approximately 8:54 PM  I have reviewed the triage vital signs and the nursing notes.   HISTORY  Chief Complaint Flank Pain    HPI Cristina Wade is a 48 y.o. female with a history of asthma, endometriosis, GERD who complains of dysuria for the past 2 weeks.  She saw her doctor, had a urine culture which showed strep agalactiae as well as Klebsiella.  She was put on clindamycin which she started yesterday but is not feeling better.  She denies fevers chills or back pain.  No vomiting or diarrhea.  She has normal energy and oral intake.      Past Medical History:  Diagnosis Date  . Asthma   . Chronic pelvic pain in female 2018  . Cystitis   . Cystocele   . Endometrial polyp   . Endometriosis    per pt report but never seen during surg  . Frequent headaches   . Gastritis   . GERD (gastroesophageal reflux disease)    rare  . Gross hematuria   . History of kidney stones   . Microscopic hematuria   . Migraine   . Migraines   . Osteopenia   . Urinary disorder   . UTI (lower urinary tract infection)      Patient Active Problem List   Diagnosis Date Noted  . Acute left-sided low back pain 06/03/2018  . Cloudy urine 04/29/2018  . Chest pain 04/29/2018  . Frontal headache 04/27/2018  . Localized, primary osteoarthritis 02/07/2018  . Pelvic pain 01/30/2018  . Slow transit constipation 10/10/2017  . Bilateral impacted cerumen 11/28/2016  . Chronic bilateral low back pain without sciatica 11/28/2016  . Precordial pain 07/07/2016  . Right bundle branch block (RBBB) on electrocardiogram (ECG) 07/07/2016  . Chest tightness 06/27/2016  . Preventative health care 09/30/2015  . Encounter to establish care 09/28/2014  . Vestibular migraine 09/28/2014  . Encounter for screening colonoscopy 11/21/2013     Past Surgical History:  Procedure Laterality  Date  . COLONOSCOPY N/A 11/05/2014   Procedure: COLONOSCOPY;  Surgeon: Scot Junobert T Elliott, MD;  Location: [facility] ENDOSCOPY;  Service: Endoscopy;  Laterality: N/A;  . COLONOSCOPY  11/2016   Dr. Mechele CollinElliott  . CYSTOSCOPY  2007   with biopsy   . CYSTOSCOPY N/A 06/01/2016   Procedure: CYSTOSCOPY;  Surgeon: Vena AustriaAndreas Staebler, MD;  Location: [facility] ORS;  Service: Gynecology;  Laterality: N/A;  . DIAGNOSTIC LAPAROSCOPY    . DILATATION & CURETTAGE/HYSTEROSCOPY WITH MYOSURE N/A 04/13/2015   Procedure: DILATATION & CURETTAGE/HYSTEROSCOPY WITH MYOSURE/POLYPECTOMY;  Surgeon: Elenora Fenderhelsea C Ward, MD;  Location: [facility] ORS;  Service: Gynecology;  Laterality: N/A;  . DILATATION & CURETTAGE/HYSTEROSCOPY WITH MYOSURE N/A 06/01/2016   Procedure: DILATATION & CURETTAGE/HYSTEROSCOPY WITH MYOSURE;  Surgeon: Vena AustriaAndreas Staebler, MD;  Location: [facility] ORS;  Service: Gynecology;  Laterality: N/A;  . GUM SURGERY    . laproscopy  2007  . POLYPECTOMY     endometrial     Prior to Admission medications   Medication Sig Start Date End Date Taking? Authorizing Provider  Calcium Carbonate-Vit D-Min (CALCIUM 1200 PO) Take 2-3 tablets by mouth daily. Reported on 05/26/2015    [provider]  cefdinir (OMNICEF) 300 MG capsule Take 1 capsule (300 mg total) by mouth 2 (two) times daily. 06/22/18   Sharman CheekStafford, Barbar Brede, MD  clindamycin (CLEOCIN) 300 MG capsule Take 1 capsule (300 mg total) by mouth 3 (three) times daily for 7 days. 06/15/18 06/22/18  Tracey Harries, FNP  Cyanocobalamin 1000 MCG CAPS Take 1 capsule by mouth daily for 30 days. 06/05/18 07/05/18  [name], FNP  docusate sodium (COLACE) 100 MG capsule Take 1 capsule by mouth daily as needed.     [provider]  levonorgestrel-ethinyl estradiol (JOLESSA) 0.15-0.03 MG tablet Take 1 tablet by mouth daily. 03/13/18   Copland, Alicia B, PA-C  lidocaine (LIDODERM) 5 % Place 1 patch onto the skin daily. Remove & Discard patch within 12 hours. 06/03/18   [name], FNP   Magnesium Citrate 200 MG TABS Take 400 mg by mouth daily. 06/03/18   [name], FNP  naproxen sodium (ANAPROX) 550 MG tablet Take at earliest onset of headache. May repeat dose in 12 hours if needed. Do not exceed two tablets in 24 hours. Take with food 06/03/18   [name], FNP  traMADol (ULTRAM) 50 MG tablet Take 1 tablet (50 mg total) by mouth 3 (three) times daily as needed. 04/27/18   Karie Schwalbe, MD     Allergies Lac bovis; Milk-related compounds; Omeprazole; and Penicillins   Family History  Problem Relation Age of Onset  . Colon cancer Father 76  . Arthritis Father   . Hyperlipidemia Father   . Transient ischemic attack Father   . Cancer - Colon Father 19  . Prostate cancer Father 67  . Arthritis Mother   . Heart disease Mother   . Stroke Mother        TIA  . Hypertension Mother   . Diabetes Mother   . Kidney cancer Mother 33  . Cancer Maternal Grandmother   . Cancer Paternal Grandfather        prostate  . Breast cancer Cousin 57    Social History Social History   Tobacco Use  . Smoking status: Never Smoker  . Smokeless tobacco: Never Used  Substance Use Topics  . Alcohol use: No    Alcohol/week: 0.0 standard drinks  . Drug use: No    Review of Systems  Constitutional:   No fever or chills.   Cardiovascular:   No chest pain or syncope. Respiratory:   No dyspnea or cough. Gastrointestinal:   Negative for abdominal pain, vomiting and diarrhea.  Musculoskeletal:   Negative for focal pain or swelling All other systems reviewed and are negative except as documented above in ROS and HPI.  ____________________________________________   PHYSICAL EXAM:  VITAL SIGNS: ED Triage Vitals  Enc Vitals Group     BP 06/22/18 1741 132/75     Pulse Rate 06/22/18 1741 74     Resp 06/22/18 1741 18     Temp 06/22/18 1741 98.4 F (36.9 C)     Temp Source 06/22/18 1741 Oral     SpO2 06/22/18 1741 98 %     Weight 06/22/18 1743 153 lb (69.4 kg)      Height 06/22/18 1743 5\' 10"  (1.778 m)     Head Circumference --      Peak Flow --      Pain Score 06/22/18 1742 7     Pain Loc --      Pain Edu? --      Excl. in GC? --     Vital signs reviewed, nursing assessments reviewed.   Constitutional:   Alert and oriented. Non-toxic appearance. Eyes:   Conjunctivae are normal. EOMI. ENT      Head:   Normocephalic and atraumatic.  Mouth/Throat:   MMM      Neck:   No meningismus. Full ROM. Hematological/Lymphatic/Immunilogical:   No cervical lymphadenopathy. Cardiovascular:   RRR. Symmetric bilateral radial and DP pulses.  No murmurs. Cap refill less than 2 seconds. Respiratory:   Normal respiratory effort without tachypnea/retractions. Breath sounds are clear and equal bilaterally. No wheezes/rales/rhonchi. Gastrointestinal:   Soft with mild suprapubic tenderness. Non distended. There is no CVA tenderness.  No rebound, rigidity, or guarding. Musculoskeletal:   Normal range of motion in all extremities. No joint effusions.  No lower extremity tenderness.  No edema. Neurologic:   Normal speech and language.  Ambulatory with steady gait Motor grossly intact. No acute focal neurologic deficits are appreciated.  Skin:    Skin is warm, dry and intact. No rash noted.  No petechiae, purpura, or bullae.  ____________________________________________    LABS (pertinent positives/negatives) (all labs ordered are listed, but only abnormal results are displayed) Labs Reviewed  URINALYSIS, COMPLETE (UACMP) WITH MICROSCOPIC - Abnormal; Notable for the following components:      Result Value   Color, Urine YELLOW (*)    APPearance CLOUDY (*)    Hgb urine dipstick SMALL (*)    Leukocytes,Ua SMALL (*)    All other components within normal limits  URINE CULTURE  BASIC METABOLIC PANEL  CBC  POC URINE PREG, ED  POCT PREGNANCY, URINE    ____________________________________________   EKG    ____________________________________________    RADIOLOGY  No results found.  ____________________________________________   PROCEDURES Procedures  ____________________________________________    CLINICAL IMPRESSION / ASSESSMENT AND PLAN / ED COURSE  Medications ordered in the ED: Medications - No data to display  Pertinent labs & imaging results that were available during my care of the patient were reviewed by me and considered in my medical decision making (see chart for details).    Patient presents with cystitis.  Urine culture data reviewed which does show strep which may be a contaminant as well as Klebsiella.  The Klebsiella is pansensitive except for penicillins which she is allergic to anyway.  Given the chronicity of the symptoms, I will start her on Omnicef.  Repeat urine culture sent today.  Plan for outpatient follow-up.  Offered patient a dose of antibiotics here in the ED which she refuses.  She is happy to be discharged and pick them up at her outpatient pharmacy, which we confirmed will be open until 10 PM tonight.      ____________________________________________   FINAL CLINICAL IMPRESSION(S) / ED DIAGNOSES    Final diagnoses:  Cystitis     ED Discharge Orders         Ordered    cefdinir (OMNICEF) 300 MG capsule  2 times daily,   Status:  Discontinued     06/22/18 2043    cefdinir (OMNICEF) 300 MG capsule  2 times daily     06/22/18 2047          Portions of this note were generated with dragon dictation software. Dictation errors may occur despite best attempts at proofreading.   Sharman Cheek, MD 06/22/18 2057

## 2018-06-22 NOTE — ED Triage Notes (Signed)
Patient presents to the ED with bilateral flank pain and lower pelvic pain.  Patient reports being treated for a UTI at the end of February.  Patient denies dysuria. Patient denies urinary frequency.  Patient is in no obvious distress at this time.

## 2018-06-22 NOTE — ED Notes (Signed)
Pt states that she was given medication for her UTI and that the pain has progressively gotten worse and wanted to see if there still was an UTI.

## 2018-06-24 LAB — URINE CULTURE

## 2018-06-25 ENCOUNTER — Ambulatory Visit: Payer: BC Managed Care – PPO

## 2018-06-28 ENCOUNTER — Other Ambulatory Visit: Payer: Self-pay

## 2018-06-28 ENCOUNTER — Ambulatory Visit
Admission: RE | Admit: 2018-06-28 | Discharge: 2018-06-28 | Disposition: A | Discharge status: Home / Self Care | Payer: BC Managed Care – PPO | Source: Ambulatory Visit | Attending: Family Medicine | Admitting: Family Medicine

## 2018-06-28 DIAGNOSIS — R109 Unspecified abdominal pain: Secondary | ICD-10-CM | POA: Insufficient documentation

## 2018-06-28 DIAGNOSIS — R3129 Other microscopic hematuria: Secondary | ICD-10-CM | POA: Insufficient documentation

## 2018-07-09 ENCOUNTER — Telehealth: Payer: Self-pay | Admitting: *Deleted

## 2018-07-09 NOTE — Telephone Encounter (Signed)
Patient has appointment 07/12/18.  Per Dr End patient should keep appointment. Needs to be prescreened for COVID over the phone.   No answer. Left message to call back.

## 2018-07-09 NOTE — Telephone Encounter (Signed)
Patient is agreeable to come in to her appointment on 07/12/18 at 2 pm.   COVID-19 Pre-Screening Questions:  . Do you currently have a fever? no (yes = cancel and refer to pcp for e-visit) . Have you recently travelled on a cruise, internationally, or to [place], [place], [place], [place], [place], or [place], [place] [employer]) ? no (yes = cancel, stay home, monitor symptoms, and contact pcp or initiate e-visit if symptoms develop) . Have you been in contact with someone that is currently pending confirmation of Covid19 testing or has been confirmed to have the Covid19 virus?  no (yes = cancel, stay home, away from tested individual, monitor symptoms, and contact pcp or initiate e-visit if symptoms develop) . Are you currently experiencing fatigue or cough? no (yes = pt should be prepared to have a mask placed at the time of their visit).

## 2018-07-10 NOTE — Progress Notes (Signed)
New Outpatient Visit Date: 07/12/2018  Referring Provider: [name], FNP [address] [place], [place] [address]  Chief Complaint: Chest pain and elevated blood pressure  HPI:  Cristina Wade is a 48 y.o. female who is being seen today for the evaluation of chest pain at the request of Cristina Wade. She has a history of asthma, GERD, and migraine headaches.  She was seen by Cristina Wade in 04/2018 with multiple complaints, including chest pain, cloudy urine, and frontal headache.  She noted that her chest pain had been present for several months and had led to an ED evaluation in 01/2018.  She was seen in 2018 by Cristina Wade [facility]) for evaluation of occasional chest pain and shortness of breath as well as EKG with RBBB.  He felt that her pain was non-cardiac in nature and further testing was deferred.  EKG performed by Cristina Wade on 04/29/2018 showed incomplete RBBB and likely limb lead reversal.  Today, Cristina Wade reports that she feels well.  She believes that today's visit was scheduled due to high blood pressure noted at a visit with Cristina Wade in January.  In fact, the patient has been concerned that she would have a stroke on account of her elevated BP.  However, the highest reading recorded in Epic is 124/90 on 04/29/2018 (in the setting of URI).  Cristina Wade does not routinely check her blood pressure at home, though she notes that it typically has run low when seeing providers.  Cristina Wade notes a long history of chest pain dating back at least 5 years.  She describes it as a vague tightness that comes on when she is stress or hurried.  It does not happen when she exercises or performs normal daily activities.  She denies shortness of breath, palpitations, lightheadedness, orthopnea, PND, and edema.  She reports having undergone some sort of stress test about 5 years ago with Cristina Wade and believes that it was negative.  She also notes that her chest pain has been  less frequent/intense compared with a few years ago.  --------------------------------------------------------------------------------------------------  Cardiovascular History & Procedures: Cardiovascular Problems:  Chest pain  Risk Factors:  None  Cath/PCI:  None  CV Surgery:  None  EP Procedures and Devices:  None  Non-Invasive Evaluation(s):  None available; patient reports negative stress test ~5 years ago  Recent CV Pertinent Labs: Lab Results  Component Value Date   CHOL 178 11/29/2016   HDL 50.60 11/29/2016   LDLCALC 113 (H) 11/29/2016   TRIG 70.0 11/29/2016   CHOLHDL 4 11/29/2016   K 4.3 06/22/2018   BUN 11 06/22/2018   CREATININE 0.70 06/22/2018    --------------------------------------------------------------------------------------------------  Past Medical History:  Diagnosis Date  . Asthma   . Chronic pelvic pain in female 2018  . Cystitis   . Cystocele   . Endometrial polyp   . Endometriosis    per pt report but never seen during surg  . Frequent headaches   . Gastritis   . GERD (gastroesophageal reflux disease)    rare  . Gross hematuria   . History of kidney stones   . Microscopic hematuria   . Migraine   . Migraines   . Osteopenia   . Urinary disorder   . UTI (lower urinary tract infection)     Past Surgical History:  Procedure Laterality Date  . COLONOSCOPY N/A 11/05/2014   Procedure: COLONOSCOPY;  Surgeon: Scot Jun, MD;  Location: [facility] ENDOSCOPY;  Service: Endoscopy;  Laterality: N/A;  . COLONOSCOPY  11/2016   Dr. Mechele Collin  . CYSTOSCOPY  2007   with biopsy   . CYSTOSCOPY N/A 06/01/2016   Procedure: CYSTOSCOPY;  Surgeon: Vena Austria, MD;  Location: [facility] ORS;  Service: Gynecology;  Laterality: N/A;  . DIAGNOSTIC LAPAROSCOPY    . DILATATION & CURETTAGE/HYSTEROSCOPY WITH MYOSURE N/A 04/13/2015   Procedure: DILATATION & CURETTAGE/HYSTEROSCOPY WITH MYOSURE/POLYPECTOMY;  Surgeon: Elenora Fender Ward, MD;  Location: [facility]  ORS;  Service: Gynecology;  Laterality: N/A;  . DILATATION & CURETTAGE/HYSTEROSCOPY WITH MYOSURE N/A 06/01/2016   Procedure: DILATATION & CURETTAGE/HYSTEROSCOPY WITH MYOSURE;  Surgeon: Vena Austria, MD;  Location: [facility] ORS;  Service: Gynecology;  Laterality: N/A;  . GUM SURGERY    . laproscopy  2007  . POLYPECTOMY     endometrial    Current Meds  Medication Sig  . Calcium Carbonate-Vit D-Min (CALCIUM 1200 PO) Take 2-3 tablets by mouth daily. Reported on 05/26/2015  . docusate sodium (COLACE) 100 MG capsule Take 1 capsule by mouth daily as needed.   . ferrous sulfate 325 (65 FE) MG tablet Take 325 mg by mouth daily.  Marland Kitchen levonorgestrel-ethinyl estradiol (JOLESSA) 0.15-0.03 MG tablet Take 1 tablet by mouth daily.  . vitamin B-12 (CYANOCOBALAMIN) 1000 MCG tablet Take 1,000 mcg by mouth daily.  . [DISCONTINUED] Magnesium Citrate 200 MG TABS Take 400 mg by mouth daily.    Allergies: Lac bovis; Milk-related compounds; Omeprazole; and Penicillins  Social History   Tobacco Use  . Smoking status: Never Smoker  . Smokeless tobacco: Never Used  Substance Use Topics  . Alcohol use: No  . Drug use: No    Family History  Problem Relation Age of Onset  . Colon cancer Father 33  . Arthritis Father   . Hyperlipidemia Father   . Transient ischemic attack Father   . Cancer - Colon Father 77  . Prostate cancer Father 70  . Valvular heart disease Father   . Arthritis Mother   . Heart disease Mother   . Stroke Mother        TIA  . Hypertension Mother   . Diabetes Mother   . Kidney cancer Mother 29  . Heart failure Mother   . Hyperlipidemia Mother   . Cancer Maternal Grandmother   . Cancer Paternal Grandfather        prostate  . Breast cancer Cousin 35    Review of Systems: Patient notes occasional cramplike pain and numbness in the left calf.  Pain is not exertional.  No wounds on either foot/leg.  Otherwise, a 12-system review of systems was negative except as noted in the HPI.   --------------------------------------------------------------------------------------------------  Physical Exam: BP 120/68 (BP Location: Right Arm, Patient Position: Sitting, Cuff Size: Normal)   Pulse 66   Ht  (1.778 m)   Wt 151 lb 12.8 oz (68.9 kg)   BMI 21.78 kg/m   General:  NAD HEENT: No conjunctival pallor or scleral icterus. Moist mucous membranes. OP clear. Neck: Supple without lymphadenopathy, thyromegaly, JVD, or HJR. No carotid bruit. Lungs: Normal work of breathing. Clear to auscultation bilaterally without wheezes or crackles. Heart: Regular rate and rhythm without murmurs, rubs, or gallops. Non-displaced PMI. Abd: Bowel sounds present. Soft, NT/ND without hepatosplenomegaly Ext: No lower extremity edema. Radial, PT, and DP pulses are 2+ bilaterally Skin: Warm and dry without rash. Neuro: CNIII-XII intact. Strength and fine-touch sensation intact in upper and lower extremities bilaterally. Psych: Normal mood and affect.  EKG:  NSR  with incomplete RBBB.  Compared with prior tracing from 04/29/2018, QRS axis has changed (prior tracing with likely limb lead reversal).  Lab Results  Component Value Date   WBC 7.0 06/22/2018   HGB 13.6 06/22/2018   HCT 41.6 06/22/2018   MCV 91.4 06/22/2018   PLT 226 06/22/2018    Lab Results  Component Value Date   NA 139 06/22/2018   K 4.3 06/22/2018   CL 106 06/22/2018   CO2 26 06/22/2018   BUN 11 06/22/2018   CREATININE 0.70 06/22/2018   GLUCOSE 85 06/22/2018   ALT 11 06/03/2018    Lab Results  Component Value Date   CHOL 178 11/29/2016   HDL 50.60 11/29/2016   LDLCALC 113 (H) 11/29/2016   TRIG 70.0 11/29/2016   CHOLHDL 4 11/29/2016     --------------------------------------------------------------------------------------------------  ASSESSMENT AND PLAN: Chest pain Discomfort has both typical and atypical features.  It is longstanding (present for >5 years) with reported negative stress test ~5 years ago.   Pain has actually improved with time.  EKG shows old incomplete RBBB and no acute ischemic changes.  We discussed repeating non-invasive ischemia testing but have agreed to defer this.  I will request a copy of her prior stress test results.  I advised Ms. Linko to contact us if her symptoms worsen.  Elevated blood pressure reading Borderline elevated DBP noted at visit with Cristina Wade in January in the setting of URI.  BP has otherwise been normal, including today.  No further workup or intervention is recommended at this time.  Left leg pain Pain is not exertional.  Given normal pedal pulses and quality/timing of pain, I have a low suspicion for hemodynamically significant pain.  Ms. Lye should continue with previously initiated workup for musculoskeletal etiology of her symptoms.  Follow-up: Return to clinic in 3 months.  Cristina Kendall, MD 07/12/2018 3:18 PM

## 2018-07-12 ENCOUNTER — Encounter: Payer: Self-pay | Admitting: Internal Medicine

## 2018-07-12 ENCOUNTER — Other Ambulatory Visit: Payer: Self-pay

## 2018-07-12 ENCOUNTER — Ambulatory Visit: Payer: BC Managed Care – PPO | Admitting: Internal Medicine

## 2018-07-12 VITALS — BP 120/68 | HR 66 | Ht 70.0 in | Wt 151.8 lb

## 2018-07-12 DIAGNOSIS — M79605 Pain in left leg: Secondary | ICD-10-CM | POA: Diagnosis not present

## 2018-07-12 DIAGNOSIS — R030 Elevated blood-pressure reading, without diagnosis of hypertension: Secondary | ICD-10-CM

## 2018-07-12 DIAGNOSIS — R079 Chest pain, unspecified: Secondary | ICD-10-CM | POA: Diagnosis not present

## 2018-07-12 NOTE — Patient Instructions (Signed)
Medication Instructions:  Your physician recommends that you continue on your current medications as directed. Please refer to the Current Medication list given to you today.  If you need a refill on your cardiac medications before your next appointment, please call your pharmacy.   Lab work: none If you have labs (blood work) drawn today and your tests are completely normal, you will receive your results only by: . MyChart Message (if you have MyChart) OR . A paper copy in the mail If you have any lab test that is abnormal or we need to change your treatment, we will call you to review the results.  Testing/Procedures: none  Follow-Up: At [facility], you and your health needs are our priority.  As part of our continuing mission to provide you with exceptional heart care, we have created designated Provider Care Teams.  These Care Teams include your primary Cardiologist (physician) and Advanced Practice Providers (APPs -  Physician Assistants and Nurse Practitioners) who all work together to provide you with the care you need, when you need it. You will need a follow up appointment in 3 months.  Please call our office 2 months in advance to schedule this appointment.  You may see DR CHRISTOPHER END or one of the following Advanced Practice Providers on your designated Care Team:   Christopher Berge, NP Ryan Dunn, PA-C . Jacquelyn Visser, PA-C    

## 2018-07-29 ENCOUNTER — Telehealth: Payer: Self-pay | Admitting: Internal Medicine

## 2018-07-29 NOTE — Telephone Encounter (Signed)
I just saw results of prior stress test through [facility] (2005) that was negative.  Pain sounds atypical and has been long-standing based on our visit last month.  I think it is fine to use ibuprofen.  If chest pain does not improve or worsens, she should go to the ED.  Given chills and ongoing coronavirus pandemic, I do not believe that repeat office visit is a good option at this time.  Yvonne Kendall, MD [facility] Pager: [phone]

## 2018-07-29 NOTE — Telephone Encounter (Signed)
Pt c/o of Chest Pain: STAT if CP now or developed within 24 hours  1. Are you having CP right now? Yes middle of chest   2. Are you experiencing any other symptoms (ex. SOB, nausea, vomiting, sweating)? Chills   3. How long have you been experiencing CP?  Interim since Saturday   4. Is your CP continuous or coming and going? Interim   5. Have you taken Nitroglycerin?  No  ?

## 2018-07-29 NOTE — Telephone Encounter (Signed)
I spoke with the patient. She has been having intermittent chest pain since Saturday and intermittent chills since before 07/03/18.  She describes her chest pain as mid-sternal, but non radiating.  She thinks stress and working a lot may have contributed to the chest pain. However, it is not worse when she presses on her chest, raises her arms, or rotates her upper body.  She has tried ibuprofen and responded well to this.  She does not associate the chills and chest pain together.  She denies fever.  She has not checked her BP/ HR at home. Chest pain is similar to what was previously reported.  She does not recall if she has ever had an echo, but does recall a stress test & EKG at [facility].  She states she followed up with neurology on 07/03/18 for some tingling to her leg. Blood was drawn and she was told she had an elevated copper level, but neurology felt this was not related to her symptoms in her lower extremities and to follow up with her PCP.  I advised the patient I will forward to Dr. Okey Dupre to confirm no echo is needed./ no further recommendations.  The patient is agreeable and voices understanding.

## 2018-07-29 NOTE — Telephone Encounter (Signed)
I spoke with the patient. She is aware of Dr. Serita Kyle recommendations and voices understanding.

## 2018-07-30 ENCOUNTER — Other Ambulatory Visit: Payer: Self-pay

## 2018-07-30 ENCOUNTER — Other Ambulatory Visit: Payer: BC Managed Care – PPO

## 2018-07-30 ENCOUNTER — Telehealth: Payer: Self-pay

## 2018-07-30 DIAGNOSIS — R300 Dysuria: Secondary | ICD-10-CM

## 2018-07-30 LAB — URINALYSIS, COMPLETE
Bilirubin, UA: NEGATIVE
Glucose, UA: NEGATIVE
Ketones, UA: NEGATIVE
Nitrite, UA: NEGATIVE
Protein,UA: NEGATIVE
Specific Gravity, UA: 1.015 (ref 1.005–1.030)
Urobilinogen, Ur: 0.2 mg/dL (ref 0.2–1.0)
pH, UA: 7 (ref 5.0–7.5)

## 2018-07-30 LAB — MICROSCOPIC EXAMINATION: Bacteria, UA: NONE SEEN

## 2018-07-30 NOTE — Telephone Encounter (Signed)
Pt called back and I read the message to her. She voiced understanding.

## 2018-07-30 NOTE — Telephone Encounter (Signed)
Called patient. Left message to return call to office. Per Dr. Lonna Cobb, UA looks good. We will wait on culture results.

## 2018-07-30 NOTE — Telephone Encounter (Signed)
Patient presented in office today and stated up front that she was told she could come to the office and drop off a urine whenever she wanted. She is complaining today of burning/painful urination, chills and back pain. Added to lab schedule. Urine collected for UA and CX. Will consult with Dr Lonna Cobb with UA and call patient with response.

## 2018-08-01 LAB — CULTURE, URINE COMPREHENSIVE

## 2018-08-02 ENCOUNTER — Other Ambulatory Visit: Payer: Self-pay | Admitting: Urology

## 2018-08-02 MED ORDER — SULFAMETHOXAZOLE-TRIMETHOPRIM 800-160 MG PO TABS: 1.0000 | ORAL_TABLET | Freq: Two times a day (BID) | ORAL | 0 refills | Status: AC

## 2018-08-05 ENCOUNTER — Telehealth: Payer: Self-pay

## 2018-08-05 NOTE — Telephone Encounter (Signed)
Called pt no answer. LM for pt informing her of the information below. Advised pt to call back for questions or concerns.  °

## 2018-08-05 NOTE — Telephone Encounter (Signed)
-----   Message from Riki Altes, MD sent at 08/02/2018 11:03 AM EDT ----- Urinalysis showed a low level of bacteria present.  Antibiotic Rx was sent to pharmacy.

## 2018-08-05 NOTE — Telephone Encounter (Signed)
Patient is concerned with frequency of urinary tract infections & would like a call back with the reason why she keeps having them. Patient can be reached at [phone].

## 2018-08-07 NOTE — Telephone Encounter (Signed)
Yes.  I would be happy to have a virtual visit with her.

## 2018-08-07 NOTE — Telephone Encounter (Signed)
Virtual office visit scheduled-patient prefers next week due to her work schedule.

## 2018-08-11 ENCOUNTER — Emergency Department
Admission: EM | Admit: 2018-08-11 | Discharge: 2018-08-11 | Disposition: A | Discharge status: Home / Self Care | Payer: BC Managed Care – PPO | Attending: Emergency Medicine | Admitting: Emergency Medicine

## 2018-08-11 ENCOUNTER — Encounter: Payer: Self-pay | Admitting: Emergency Medicine

## 2018-08-11 ENCOUNTER — Emergency Department: Payer: BC Managed Care – PPO

## 2018-08-11 ENCOUNTER — Other Ambulatory Visit: Payer: Self-pay

## 2018-08-11 DIAGNOSIS — J45909 Unspecified asthma, uncomplicated: Secondary | ICD-10-CM | POA: Diagnosis not present

## 2018-08-11 DIAGNOSIS — R338 Other retention of urine: Secondary | ICD-10-CM

## 2018-08-11 DIAGNOSIS — R339 Retention of urine, unspecified: Secondary | ICD-10-CM | POA: Diagnosis present

## 2018-08-11 DIAGNOSIS — R079 Chest pain, unspecified: Secondary | ICD-10-CM | POA: Diagnosis not present

## 2018-08-11 DIAGNOSIS — Z79899 Other long term (current) drug therapy: Secondary | ICD-10-CM | POA: Insufficient documentation

## 2018-08-11 DIAGNOSIS — N200 Calculus of kidney: Secondary | ICD-10-CM | POA: Diagnosis not present

## 2018-08-11 DIAGNOSIS — F419 Anxiety disorder, unspecified: Secondary | ICD-10-CM | POA: Insufficient documentation

## 2018-08-11 LAB — CBC WITH DIFFERENTIAL/PLATELET
Abs Immature Granulocytes: 0.02 10*3/uL (ref 0.00–0.07)
Basophils Absolute: 0 10*3/uL (ref 0.0–0.1)
Basophils Relative: 0 %
Eosinophils Absolute: 0 10*3/uL (ref 0.0–0.5)
Eosinophils Relative: 1 %
HCT: 40.2 % (ref 36.0–46.0)
Hemoglobin: 13.1 g/dL (ref 12.0–15.0)
Immature Granulocytes: 0 %
Lymphocytes Relative: 27 %
Lymphs Abs: 1.7 10*3/uL (ref 0.7–4.0)
MCH: 30.1 pg (ref 26.0–34.0)
MCHC: 32.6 g/dL (ref 30.0–36.0)
MCV: 92.4 fL (ref 80.0–100.0)
Monocytes Absolute: 0.6 10*3/uL (ref 0.1–1.0)
Monocytes Relative: 10 %
Neutro Abs: 3.8 10*3/uL (ref 1.7–7.7)
Neutrophils Relative %: 62 %
Platelets: 216 10*3/uL (ref 150–400)
RBC: 4.35 MIL/uL (ref 3.87–5.11)
RDW: 12.4 % (ref 11.5–15.5)
WBC: 6.2 10*3/uL (ref 4.0–10.5)
nRBC: 0 % (ref 0.0–0.2)

## 2018-08-11 LAB — URINALYSIS, COMPLETE (UACMP) WITH MICROSCOPIC
Bacteria, UA: NONE SEEN
Bilirubin Urine: NEGATIVE
Glucose, UA: NEGATIVE mg/dL
Ketones, ur: NEGATIVE mg/dL
Leukocytes,Ua: NEGATIVE
Nitrite: NEGATIVE
Protein, ur: NEGATIVE mg/dL
Specific Gravity, Urine: 1.006 (ref 1.005–1.030)
pH: 7 (ref 5.0–8.0)

## 2018-08-11 LAB — COMPREHENSIVE METABOLIC PANEL
ALT: 12 U/L (ref 0–44)
AST: 15 U/L (ref 15–41)
Albumin: 4 g/dL (ref 3.5–5.0)
Alkaline Phosphatase: 33 U/L — ABNORMAL LOW (ref 38–126)
Anion gap: 8 (ref 5–15)
BUN: 11 mg/dL (ref 6–20)
CO2: 22 mmol/L (ref 22–32)
Calcium: 8.8 mg/dL — ABNORMAL LOW (ref 8.9–10.3)
Chloride: 108 mmol/L (ref 98–111)
Creatinine, Ser: 0.79 mg/dL (ref 0.44–1.00)
GFR calc Af Amer: >60 mL/min (ref >60)
GFR calc non Af Amer: >60 mL/min (ref >60)
Glucose, Bld: 88 mg/dL (ref 70–99)
Potassium: 3.7 mmol/L (ref 3.5–5.1)
Sodium: 138 mmol/L (ref 135–145)
Total Bilirubin: 0.5 mg/dL (ref 0.3–1.2)
Total Protein: 7.1 g/dL (ref 6.5–8.1)

## 2018-08-11 MED ORDER — DIAZEPAM 2 MG PO TABS: 2.0000 mg | ORAL_TABLET | Freq: Three times a day (TID) | ORAL | 0 refills | Status: DC | PRN

## 2018-08-11 MED ORDER — TAMSULOSIN HCL 0.4 MG PO CAPS
0.4000 mg | ORAL_CAPSULE | Freq: Once | ORAL | Status: AC
  Administered 2018-08-11: 0.4 mg via ORAL
  Filled 2018-08-11: qty 1

## 2018-08-11 MED ORDER — DIAZEPAM 2 MG PO TABS
2.0000 mg | ORAL_TABLET | Freq: Once | ORAL | Status: AC
  Administered 2018-08-11: 2 mg via ORAL
  Filled 2018-08-11: qty 1

## 2018-08-11 MED ORDER — TAMSULOSIN HCL 0.4 MG PO CAPS: 0.4000 mg | ORAL_CAPSULE | Freq: Every day | ORAL | 0 refills | Status: DC

## 2018-08-11 MED ORDER — DIAZEPAM 2 MG PO TABS
2.0000 mg | ORAL_TABLET | Freq: Once | ORAL | Status: DC
  Filled 2018-08-11: qty 1

## 2018-08-11 NOTE — ED Notes (Signed)
Pt verbalized understanding of d/c instructions, RX, and f/u care. No further questions at this time. Pt awaits family member to pick her up at this time.

## 2018-08-11 NOTE — Discharge Instructions (Signed)
Follow up with urology and primary care.  Drink enough water to keep your urine a pale yellow.

## 2018-08-11 NOTE — ED Triage Notes (Signed)
Pt presents to ED via POV with c/o urinary retention. Pt states was referred by [facility]. Pt states feels like she needs to go but is unable to urinate, hx of kidney stones in each kidney. Pt also c/o substernal chest pain, intermittent radiation to rest of her chest, denies radiation at this time.

## 2018-08-11 NOTE — ED Notes (Signed)
Pt assisted to the exit via wheelchair to meet her ride home.

## 2018-08-11 NOTE — ED Provider Notes (Signed)
[facility] Emergency Department Provider Note  ___________________________________________   First MD Initiated Contact with Patient 08/11/18 1818     (approximate)  I have reviewed the triage vital signs and the nursing notes.   HISTORY  Chief Complaint Urinary Retention and Chest Pain   HPI Cristina Wade is a 48 y.o. female who presents to the emergency department for treatment and evaluation of anxiety and urinary retention.  Patient states that she has "a lot going on" and is very anxious and nervous.  She states that this causes her to awaken from sleep and she feels that her heart rate is high and her chest hurts.  She states that she also wakes up with a headache. This seems to resolve when she calms down. She denies chest pain at this time.   Over the past several days she has felt as if she can't get her bladder completely empty. She is still able to urinate, just feels like there is urine left after peeing. She has no dysuria.    Past Medical History:  Diagnosis Date  . Asthma   . Chronic pelvic pain in female 2018  . Cystitis   . Cystocele   . Endometrial polyp   . Endometriosis    per pt report but never seen during surg  . Frequent headaches   . Gastritis   . GERD (gastroesophageal reflux disease)    rare  . Gross hematuria   . History of kidney stones   . Microscopic hematuria   . Migraine   . Migraines   . Osteopenia   . Urinary disorder   . UTI (lower urinary tract infection)     Patient Active Problem List   Diagnosis Date Noted  . Elevated blood pressure reading without diagnosis of hypertension 07/12/2018  . Left leg pain 07/12/2018  . Acute left-sided low back pain 06/03/2018  . Cloudy urine 04/29/2018  . Chest pain 04/29/2018  . Frontal headache 04/27/2018  . Localized, primary osteoarthritis 02/07/2018  . Pelvic pain 01/30/2018  . Slow transit constipation 10/10/2017  . Bilateral impacted cerumen 11/28/2016  .  Chronic bilateral low back pain without sciatica 11/28/2016  . Precordial pain 07/07/2016  . Right bundle branch block (RBBB) on electrocardiogram (ECG) 07/07/2016  . Chest tightness 06/27/2016  . Preventative health care 09/30/2015  . Encounter to establish care 09/28/2014  . Vestibular migraine 09/28/2014  . Encounter for screening colonoscopy 11/21/2013    Past Surgical History:  Procedure Laterality Date  . COLONOSCOPY N/A 11/05/2014   Procedure: COLONOSCOPY;  Surgeon: Scot Jun, MD;  Location[facility] ENDOSCOPY;  Service: Endoscopy;  Laterality: N/A;  . COLONOSCOPY  11/2016   Dr. Mechele Collin  . CYSTOSCOPY  2007   with biopsy   . CYSTOSCOPY N/A 06/01/2016   Procedure: CYSTOSCOPY;  Surgeon: Vena Austria, MD;  Location: [facility] ORS;  Service: Gynecology;  Laterality: N/A;  . DIAGNOSTIC LAPAROSCOPY    . DILATATION & CURETTAGE/HYSTEROSCOPY WITH MYOSURE N/A 04/13/2015   Procedure: DILATATION & CURETTAGE/HYSTEROSCOPY WITH MYOSURE/POLYPECTOMY;  Surgeon: Elenora Fender Ward, MD;  Location: [facility] ORS;  Service: Gynecology;  Laterality: N/A;  . DILATATION & CURETTAGE/HYSTEROSCOPY WITH MYOSURE N/A 06/01/2016   Procedure: DILATATION & CURETTAGE/HYSTEROSCOPY WITH MYOSURE;  Surgeon: Vena Austria, MD;  Location: [facility] ORS;  Service: Gynecology;  Laterality: N/A;  . GUM SURGERY    . laproscopy  2007  . POLYPECTOMY     endometrial    Prior to Admission medications   Medication Sig Start Date  End Date Taking? Authorizing Provider  Calcium Carbonate-Vit D-Min (CALCIUM 1200 PO) Take 2-3 tablets by mouth daily. Reported on 05/26/2015    [provider]  diazepam (VALIUM) 2 MG tablet Take 1 tablet (2 mg total) by mouth every 8 (eight) hours as needed. 08/11/18   Leahmarie Gasiorowski B, FNP  docusate sodium (COLACE) 100 MG capsule Take 1 capsule by mouth daily as needed.     [provider]  ferrous sulfate 325 (65 FE) MG tablet Take 325 mg by mouth daily.    [provider]   levonorgestrel-ethinyl estradiol (JOLESSA) 0.15-0.03 MG tablet Take 1 tablet by mouth daily. 03/13/18   Copland, Ilona Sorrel, PA-C  tamsulosin (FLOMAX) 0.4 MG CAPS capsule Take 1 capsule (0.4 mg total) by mouth daily for 7 days. 08/11/18 08/18/18  Aroush Chasse, Kasandra Knudsen, FNP  vitamin B-12 (CYANOCOBALAMIN) 1000 MCG tablet Take 1,000 mcg by mouth daily.    [provider]    Allergies Lac bovis; Milk-related compounds; Omeprazole; and Penicillins  Family History  Problem Relation Age of Onset  . Colon cancer Father 9  . Arthritis Father   . Hyperlipidemia Father   . Transient ischemic attack Father   . Cancer - Colon Father 58  . Prostate cancer Father 50  . Valvular heart disease Father   . Arthritis Mother   . Heart disease Mother   . Stroke Mother        TIA  . Hypertension Mother   . Diabetes Mother   . Kidney cancer Mother 37  . Heart failure Mother   . Hyperlipidemia Mother   . Cancer Maternal Grandmother   . Cancer Paternal Grandfather        prostate  . Breast cancer Cousin 22    Social History Social History   Tobacco Use  . Smoking status: Never Smoker  . Smokeless tobacco: Never Used  Substance Use Topics  . Alcohol use: No  . Drug use: No    Review of Systems  Constitutional: No fever/chills Eyes: No visual changes. ENT: No sore throat. Cardiovascular: Intermittent chest pain. Respiratory: Denies shortness of breath. Gastrointestinal: No abdominal pain.  No nausea, no vomiting.  No diarrhea.  No constipation. Genitourinary: Negative for dysuria. Positive for urinary retention. Musculoskeletal: Positive for back pain. Skin: Negative for rash. Neurological: Negative for headaches, focal weakness or numbness. ____________________________________________   PHYSICAL EXAM:  VITAL SIGNS: ED Triage Vitals  Enc Vitals Group     BP 08/11/18 1608 (!) 156/85     Pulse Rate 08/11/18 1608 70     Resp 08/11/18 1608 18     Temp 08/11/18 1608 98.9 F (37.2  C)     Temp Source 08/11/18 1608 Oral     SpO2 08/11/18 1608 95 %     Weight 08/11/18 1607 151 lb (68.5 kg)     Height 08/11/18 1607  (1.778 m)     Head Circumference --      Peak Flow --      Pain Score 08/11/18 1606 0     Pain Loc --      Pain Edu? --      Excl. in GC? --     Constitutional: Alert and oriented. Anxious appearing and in no acute distress. Eyes: Conjunctivae are normal. PERRL. EOMI.  Head: Atraumatic. Nose: No congestion/rhinnorhea. Mouth/Throat: Mucous membranes are moist.  Oropharynx non-erythematous. Neck: No stridor.   Cardiovascular: Normal rate, regular rhythm. Grossly normal heart sounds.  Good peripheral circulation. Respiratory:  Normal respiratory effort.  No retractions. Lungs CTAB. Gastrointestinal: Soft and nontender. No distention. No abdominal bruits. No CVA tenderness. Musculoskeletal: No lower extremity tenderness nor edema.  No joint effusions. Neurologic:  Normal speech and language. No gross focal neurologic deficits are appreciated. No gait instability. Skin:  Skin is warm, dry and intact. No rash noted. Psychiatric: Mood and affect are normal. Speech and behavior are normal.  ____________________________________________   LABS (all labs ordered are listed, but only abnormal results are displayed)  Labs Reviewed  COMPREHENSIVE METABOLIC PANEL - Abnormal; Notable for the following components:      Result Value   Calcium 8.8 (*)    Alkaline Phosphatase 33 (*)    All other components within normal limits  URINALYSIS, COMPLETE (UACMP) WITH MICROSCOPIC - Abnormal; Notable for the following components:   Color, Urine STRAW (*)    APPearance CLEAR (*)    Hgb urine dipstick SMALL (*)    All other components within normal limits  CBC WITH DIFFERENTIAL/PLATELET   ____________________________________________  EKG  ED ECG REPORT I, Kimball Appleby, FNP-BC personally viewed and interpreted this ECG.   Date: 08/11/2018  EKG Time: 1613   Rate: 65  Rhythm: normal EKG, normal sinus rhythm  Axis: normal  Intervals:none  ST&T Change: no ST elevation  ____________________________________________  RADIOLOGY  ED MD interpretation: Tiny, nonobstructing bilateral renal calculi.  No ureteral calculus or hydronephrosis.  Mild bladder distention without wall thickening or surrounding inflammation.  Official radiology report(s): Ct Renal Stone Study  Result Date: 08/11/2018 CLINICAL DATA:  48 year old with urinary retention. History of kidney stones. EXAM: CT ABDOMEN AND PELVIS WITHOUT CONTRAST TECHNIQUE: Multidetector CT imaging of the abdomen and pelvis was performed following the standard protocol without IV contrast. COMPARISON:  CT 06/28/2018. FINDINGS: Lower chest: Clear lung bases. No significant pleural or pericardial effusion. Hepatobiliary: The liver appears unremarkable as imaged in the noncontrast state. No evidence of gallstones, gallbladder wall thickening or biliary dilatation. Pancreas: Unremarkable. No pancreatic ductal dilatation or surrounding inflammatory changes. Spleen: Normal in size without focal abnormality. Adrenals/Urinary Tract: Both adrenal glands appear normal. There are tiny nonobstructing bilateral renal calculi, similar to the previous study. No evidence of ureteral or bladder calculus. There is no hydronephrosis or perinephric soft tissue stranding. The bladder is mildly distended without wall thickening or surrounding inflammation. Stomach/Bowel: No evidence of bowel wall thickening, distention or surrounding inflammatory change. There is some high-density material within the appendiceal lumen, but no discrete appendectomy lith, wall thickening or surrounding inflammation. Vascular/Lymphatic: There are no enlarged abdominal or pelvic lymph nodes. No significant vascular findings on noncontrast imaging. Reproductive: The uterus and ovaries appear normal. No adnexal mass. Other: No evidence of abdominal wall mass  or hernia. No ascites. Musculoskeletal: No acute or significant osseous findings. IMPRESSION: 1. No acute findings or explanation for the patient's symptoms. 2. Tiny nonobstructing bilateral renal calculi, similar to previous study. No ureteral calculus or hydronephrosis. 3. Mild bladder distention without wall thickening or surrounding inflammation. Electronically Signed   By: Carey Bullocks M.D.   On: 08/11/2018 18:18    ____________________________________________   PROCEDURES  Procedure(s) performed: None  Procedures  Critical Care performed: No  ____________________________________________   INITIAL IMPRESSION / ASSESSMENT AND PLAN / ED COURSE  48 year old female presents to the emergency department for treatment and evaluation of bilateral lower back pain with urinary retention and anxiety.  Patient is able to produce urine on request, but states that she just feels like she cannot completely empty her  bladder.  She is concerned that the kidney stones are "blocking" the ureters which is why she presented today.  Post void bladder scan shows that she retained just over 300 cc. CT for renal stone is reassuring.  There is no hydronephrosis or obstruction.  Urinalysis does show a little bit hemoglobin but otherwise no infection or other concerns.  For her anxiety, she will be treated with Valium 2 mg to be taken at night before bed.  She will be given Flomax for urinary retention.  She is to call and schedule follow-up appointment with her primary care provider for further management of anxiety and she is to follow-up with her urologist for feeling of urinary retention.  She will be discharged home follow-up patient questions were answered and return precautions were discussed.      ____________________________________________   FINAL CLINICAL IMPRESSION(S) / ED DIAGNOSES  Final diagnoses:  Renal calculi  Acute urinary retention  Anxiety     ED Discharge Orders          Ordered    tamsulosin (FLOMAX) 0.4 MG CAPS capsule  Daily     08/11/18 1914    diazepam (VALIUM) 2 MG tablet  Every 8 hours PRN     08/11/18 1914           Note:  This document was prepared using Dragon voice recognition software and may include unintentional dictation errors.    Chinita Pesterriplett, Donnita Farina B, FNP 08/11/18 2013    Sharman CheekStafford, Phillip, MD 08/11/18 2041

## 2018-08-12 ENCOUNTER — Ambulatory Visit (INDEPENDENT_AMBULATORY_CARE_PROVIDER_SITE_OTHER): Payer: BC Managed Care – PPO | Admitting: Family

## 2018-08-12 ENCOUNTER — Telehealth: Payer: Self-pay | Admitting: Family

## 2018-08-12 ENCOUNTER — Encounter: Payer: Self-pay | Admitting: Family

## 2018-08-12 DIAGNOSIS — F419 Anxiety disorder, unspecified: Secondary | ICD-10-CM | POA: Diagnosis not present

## 2018-08-12 DIAGNOSIS — R339 Retention of urine, unspecified: Secondary | ICD-10-CM | POA: Diagnosis not present

## 2018-08-12 MED ORDER — ESCITALOPRAM OXALATE 10 MG PO TABS: 10.0000 mg | ORAL_TABLET | Freq: Every day | ORAL | 1 refills | Status: DC

## 2018-08-12 NOTE — Assessment & Plan Note (Signed)
Worsened of late, will try Lexapro as well as counseling.  Close follow-up.  Patient will let me know how she is doing

## 2018-08-12 NOTE — Telephone Encounter (Signed)
Patient has Doxy at 2:30p.

## 2018-08-12 NOTE — Patient Instructions (Addendum)
Start lexapro  Today we discussed referrals, orders. counseling   I have placed these orders in the system for you.  Please be sure to give [place] a call if you have not heard from our office regarding this. We should hear from [place] within ONE week with information regarding your appointment. If not, please let me know immediately.   Please continue your routine follow-up with urology, cardiology, neurology as we discussed today.     Generalized Anxiety Disorder, Adult Generalized anxiety disorder (GAD) is a mental health disorder. People with this condition constantly worry about everyday events. Unlike normal anxiety, worry related to GAD is not triggered by a specific event. These worries also do not fade or get better with time. GAD interferes with life functions, including relationships, work, and school. GAD can vary from mild to severe. People with severe GAD can have intense waves of anxiety with physical symptoms (panic attacks). What are the causes? The exact cause of GAD is not known. What increases the risk? This condition is more likely to develop in:  Women.  People who have a family history of anxiety disorders.  People who are very shy.  People who experience very stressful life events, such as the death of a loved one.  People who have a very stressful family environment. What are the signs or symptoms? People with GAD often worry excessively about many things in their lives, such as their health and family. They may also be overly concerned about:  Doing well at work.  Being on time.  Natural disasters.  Friendships. Physical symptoms of GAD include:  Fatigue.  Muscle tension or having muscle twitches.  Trembling or feeling shaky.  Being easily startled.  Feeling like your heart is pounding or racing.  Feeling out of breath or like you cannot take a deep breath.  Having trouble falling asleep or staying asleep.  Sweating.  Nausea, diarrhea, or  irritable bowel syndrome (IBS).  Headaches.  Trouble concentrating or remembering facts.  Restlessness.  Irritability. How is this diagnosed? Your health care provider can diagnose GAD based on your symptoms and medical history. You will also have a physical exam. The health care provider will ask specific questions about your symptoms, including how severe they are, when they started, and if they come and go. Your health care provider may ask you about your use of alcohol or drugs, including prescription medicines. Your health care provider may refer you to a mental health specialist for further evaluation. Your health care provider will do a thorough examination and may perform additional tests to rule out other possible causes of your symptoms. To be diagnosed with GAD, a person must have anxiety that:  Is out of his or her control.  Affects several different aspects of his or her life, such as work and relationships.  Causes distress that makes him or her unable to take part in normal activities.  Includes at least three physical symptoms of GAD, such as restlessness, fatigue, trouble concentrating, irritability, muscle tension, or sleep problems. Before your health care provider can confirm a diagnosis of GAD, these symptoms must be present more days than they are not, and they must last for six months or longer. How is this treated? The following therapies are usually used to treat GAD:  Medicine. Antidepressant medicine is usually prescribed for long-term daily control. Antianxiety medicines may be added in severe cases, especially when panic attacks occur.  Talk therapy (psychotherapy). Certain types of talk therapy can be  helpful in treating GAD by providing support, education, and guidance. Options include: ? Cognitive behavioral therapy (CBT). People learn coping skills and techniques to ease their anxiety. They learn to identify unrealistic or negative thoughts and behaviors  and to replace them with positive ones. ? Acceptance and commitment therapy (ACT). This treatment teaches people how to be mindful as a way to cope with unwanted thoughts and feelings. ? Biofeedback. This process trains you to manage your body's response (physiological response) through breathing techniques and relaxation methods. You will work with a therapist while machines are used to monitor your physical symptoms.  Stress management techniques. These include yoga, meditation, and exercise. A mental health specialist can help determine which treatment is best for you. Some people see improvement with one type of therapy. However, other people require a combination of therapies. Follow these instructions at home:  Take over-the-counter and prescription medicines only as told by your health care provider.  Try to maintain a normal routine.  Try to anticipate stressful situations and allow extra time to manage them.  Practice any stress management or self-calming techniques as taught by your health care provider.  Do not punish yourself for setbacks or for not making progress.  Try to recognize your accomplishments, even if they are small.  Keep all follow-up visits as told by your health care provider. This is important. Contact a health care provider if:  Your symptoms do not get better.  Your symptoms get worse.  You have signs of depression, such as: ? A persistently sad, cranky, or irritable mood. ? Loss of enjoyment in activities that used to bring you joy. ? Change in weight or eating. ? Changes in sleeping habits. ? Avoiding friends or family members. ? Loss of energy for normal tasks. ? Feelings of guilt or worthlessness. Get help right away if:  You have serious thoughts about hurting yourself or others. If you ever feel like you may hurt yourself or others, or have thoughts about taking your own life, get help right away. You can go to your nearest emergency  department or call:  Your local emergency services (911 in the [place]  A suicide crisis helpline, such as the National Suicide Prevention Lifeline at (671)744-49191-772 445 7888. This is open 24 hours a day. Summary  Generalized anxiety disorder (GAD) is a mental health disorder that involves worry that is not triggered by a specific event.  People with GAD often worry excessively about many things in their lives, such as their health and family.  GAD may cause physical symptoms such as restlessness, trouble concentrating, sleep problems, frequent sweating, nausea, diarrhea, headaches, and trembling or muscle twitching.  A mental health specialist can help determine which treatment is best for you. Some people see improvement with one type of therapy. However, other people require a combination of therapies. This information is not intended to replace advice given to you by your health care provider. Make sure you discuss any questions you have with your health care provider. Document Released: 07/29/2012 Document Revised: 02/22/2016 Document Reviewed: 02/22/2016 Elsevier Interactive Patient Education  2019 [employer]

## 2018-08-12 NOTE — Assessment & Plan Note (Signed)
Reviewed emergency room visit with patient today.  Crt normal. Reviewed CT renal with her as wel. Renal calculi are small, nonobstructing.  She has a consult with urology in the morning.  will follow.

## 2018-08-12 NOTE — Progress Notes (Signed)
Verbal consent for services obtained from patient prior to services given.  Location of call:  provider at work patient at home  Names of all persons present for services: Rennie Plowman, NP Chief complaint:   Anxiety-worsened this past month. describes waking up night and feels 'jittery'. Not sleeping well. No depression.  Mind is racing. No si/hi. Had 'stuff' on my mind for this past month. Worried about 'kidneys about shutting down.' Took valium last night and felt 'wobbly' this morning. Has stopped medication.  Would like xanax.   No dysuria, abdominal pain, fever.   ED 08/11/18- urinary retention.  CT renal showed no evidence of gallstones.  Tiny nonobstructing bilateral renal calculi.  Mildly distended that wall thickening or surrounding inflammation.  No thickening or surrounding inflammation in the stomach, bowel Given Valium 2mg , flomax. No blood in UA.   Telemedicine appointment 08/13/18 with urology, [facility] Plan. Recently treated for UTI.   Non smoker  A/P/next steps:  Problem List Items Addressed This Visit      Genitourinary   Urinary retention    Reviewed emergency room visit with patient today.  Crt normal. Reviewed CT renal with her as wel. Renal calculi are small, nonobstructing.  She has a consult with urology in the morning.  will follow.        Other   Anxiety - Primary    Worsened of late, will try Lexapro as well as counseling.  Close follow-up.  Patient will let me know how she is doing      Relevant Medications   escitalopram (LEXAPRO) 10 MG tablet   Other Relevant Orders   Ambulatory referral to Psychology      I spent 25 min  discussing plan of care over the phone.

## 2018-08-12 NOTE — Telephone Encounter (Signed)
Patient called and requested that something be sent in for her nerves. She stated her sister [name] in on Xanax and she would like that sent for her as well. She says she is just very anxious.

## 2018-08-13 ENCOUNTER — Other Ambulatory Visit: Payer: Self-pay

## 2018-08-13 ENCOUNTER — Telehealth (INDEPENDENT_AMBULATORY_CARE_PROVIDER_SITE_OTHER): Payer: BC Managed Care – PPO | Admitting: Urology

## 2018-08-13 ENCOUNTER — Telehealth: Payer: Self-pay | Admitting: Urology

## 2018-08-13 DIAGNOSIS — N390 Urinary tract infection, site not specified: Secondary | ICD-10-CM

## 2018-08-13 DIAGNOSIS — K5909 Other constipation: Secondary | ICD-10-CM

## 2018-08-13 MED ORDER — BZK ANTISEPTIC TOWELETTES EX MISC: 1.0000 [IU] | CUTANEOUS | 3 refills | Status: DC | PRN

## 2018-08-13 MED ORDER — TAMSULOSIN HCL 0.4 MG PO CAPS: 0.4000 mg | ORAL_CAPSULE | Freq: Every day | ORAL | 3 refills | Status: DC

## 2018-08-13 NOTE — Telephone Encounter (Signed)
Would you call Cristina Wade and have her schedule a one month follow up with me for a PVR and symptom recheck.

## 2018-08-14 ENCOUNTER — Encounter: Payer: Self-pay | Admitting: Emergency Medicine

## 2018-08-14 ENCOUNTER — Other Ambulatory Visit: Payer: Self-pay

## 2018-08-14 ENCOUNTER — Emergency Department: Payer: BC Managed Care – PPO

## 2018-08-14 ENCOUNTER — Emergency Department
Admission: EM | Admit: 2018-08-14 | Discharge: 2018-08-14 | Disposition: A | Discharge status: Home / Self Care | Payer: BC Managed Care – PPO | Attending: Emergency Medicine | Admitting: Emergency Medicine

## 2018-08-14 DIAGNOSIS — Z79899 Other long term (current) drug therapy: Secondary | ICD-10-CM | POA: Insufficient documentation

## 2018-08-14 DIAGNOSIS — R0789 Other chest pain: Secondary | ICD-10-CM | POA: Insufficient documentation

## 2018-08-14 DIAGNOSIS — R519 Headache, unspecified: Secondary | ICD-10-CM

## 2018-08-14 DIAGNOSIS — R51 Headache: Secondary | ICD-10-CM | POA: Insufficient documentation

## 2018-08-14 DIAGNOSIS — G8929 Other chronic pain: Secondary | ICD-10-CM | POA: Diagnosis not present

## 2018-08-14 MED ORDER — ACETAMINOPHEN 325 MG PO TABS
650.0000 mg | ORAL_TABLET | Freq: Once | ORAL | Status: AC
  Administered 2018-08-14: 650 mg via ORAL
  Filled 2018-08-14: qty 2

## 2018-08-14 MED ORDER — METOCLOPRAMIDE HCL 10 MG PO TABS
10.0000 mg | ORAL_TABLET | Freq: Once | ORAL | Status: AC
  Administered 2018-08-14: 10 mg via ORAL
  Filled 2018-08-14: qty 1

## 2018-08-14 MED ORDER — METOCLOPRAMIDE HCL 5 MG PO TABS: 5.0000 mg | ORAL_TABLET | Freq: Three times a day (TID) | ORAL | 0 refills | Status: DC | PRN

## 2018-08-14 MED ORDER — DIPHENHYDRAMINE HCL 25 MG PO CAPS
25.0000 mg | ORAL_CAPSULE | Freq: Once | ORAL | Status: AC
  Administered 2018-08-14: 25 mg via ORAL
  Filled 2018-08-14: qty 1

## 2018-08-14 NOTE — ED Provider Notes (Signed)
[facility] Emergency Department Provider Note ____________________________________________  Time seen: 1944  I have reviewed the triage vital signs and the nursing notes.  HISTORY  Chief Complaint  Headache and Shortness of Breath  HPI Cristina Wade is a 48 y.o. female presents to the ED for evaluation of headache pain as well as some anterior chest wall pain.  Patient with a history of anxiety, chronic migraines, and recent diagnosed with renal calculi.  She also reports she recently started on Lexapro for her anxiety.  She denies any fevers, chills, or sweats, nausea, vomiting, dizziness. She has  She reports her base headache that has been treated in the past with intermittent doses of aspirin, but denies any daily medication treatment.  Patient had an MRI of the brain in February 2020, which was normal at the time.  She presents today without any new symptoms and denies any head injury, fall, weakness, dizziness, vertigo, or syncope.  Past Medical History:  Diagnosis Date  . Asthma   . Chronic pelvic pain in female 2018  . Cystitis   . Cystocele   . Endometrial polyp   . Endometriosis    per pt report but never seen during surg  . Frequent headaches   . Gastritis   . GERD (gastroesophageal reflux disease)    rare  . Gross hematuria   . History of kidney stones   . Microscopic hematuria   . Migraine   . Migraines   . Osteopenia   . Urinary disorder   . UTI (lower urinary tract infection)     Patient Active Problem List   Diagnosis Date Noted  . Urinary retention 08/12/2018  . Anxiety 08/12/2018  . Elevated blood pressure reading without diagnosis of hypertension 07/12/2018  . Left leg pain 07/12/2018  . Acute left-sided low back pain 06/03/2018  . Cloudy urine 04/29/2018  . Chest pain 04/29/2018  . Frontal headache 04/27/2018  . Localized, primary osteoarthritis 02/07/2018  . Pelvic pain 01/30/2018  . Slow transit constipation 10/10/2017  .  Bilateral impacted cerumen 11/28/2016  . Chronic bilateral low back pain without sciatica 11/28/2016  . Precordial pain 07/07/2016  . Right bundle branch block (RBBB) on electrocardiogram (ECG) 07/07/2016  . Chest tightness 06/27/2016  . Preventative health care 09/30/2015  . Encounter to establish care 09/28/2014  . Vestibular migraine 09/28/2014  . Encounter for screening colonoscopy 11/21/2013    Past Surgical History:  Procedure Laterality Date  . COLONOSCOPY N/A 11/05/2014   Procedure: COLONOSCOPY;  Surgeon: Scot Jun, MD;  Location: [facility] ENDOSCOPY;  Service: Endoscopy;  Laterality: N/A;  . COLONOSCOPY  11/2016   Dr. Mechele Collin  . CYSTOSCOPY  2007   with biopsy   . CYSTOSCOPY N/A 06/01/2016   Procedure: CYSTOSCOPY;  Surgeon: Vena Austria, MD;  Location: [facility] ORS;  Service: Gynecology;  Laterality: N/A;  . DIAGNOSTIC LAPAROSCOPY    . DILATATION & CURETTAGE/HYSTEROSCOPY WITH MYOSURE N/A 04/13/2015   Procedure: DILATATION & CURETTAGE/HYSTEROSCOPY WITH MYOSURE/POLYPECTOMY;  Surgeon: Elenora Fender Ward, MD;  Location: [facility] ORS;  Service: Gynecology;  Laterality: N/A;  . DILATATION & CURETTAGE/HYSTEROSCOPY WITH MYOSURE N/A 06/01/2016   Procedure: DILATATION & CURETTAGE/HYSTEROSCOPY WITH MYOSURE;  Surgeon: Vena Austria, MD;  Location: [facility] ORS;  Service: Gynecology;  Laterality: N/A;  . GUM SURGERY    . laproscopy  2007  . POLYPECTOMY     endometrial    Prior to Admission medications   Medication Sig Start Date End Date Taking? Authorizing Provider  Benzalkonium Chloride (BZK  ANTISEPTIC TOWELETTES) MISC Apply 1 Units topically as needed. 08/13/18   Michiel CowboyMcGowan, Shannon A, PA-C  Calcium Carbonate-Vit D-Min (CALCIUM 1200 PO) Take 2-3 tablets by mouth daily. Reported on 05/26/2015    [provider]  docusate sodium (COLACE) 100 MG capsule Take 1 capsule by mouth daily as needed.     [provider]  escitalopram (LEXAPRO) 10 MG tablet Take 1 tablet (10 mg total) by  mouth daily. 08/12/18   Allegra GranaArnett, Margaret G, FNP  ferrous sulfate 325 (65 FE) MG tablet Take 325 mg by mouth daily.    [provider]  levonorgestrel-ethinyl estradiol (JOLESSA) 0.15-0.03 MG tablet Take 1 tablet by mouth daily. 03/13/18   Copland, Ilona SorrelAlicia B, PA-C  metoCLOPramide (REGLAN) 5 MG tablet Take 1 tablet (5 mg total) by mouth every 8 (eight) hours as needed for nausea or vomiting. 08/14/18   Enslee Bibbins, Charlesetta IvoryJenise V Bacon, PA-C  tamsulosin (FLOMAX) 0.4 MG CAPS capsule Take 1 capsule (0.4 mg total) by mouth daily. 08/13/18   Michiel CowboyMcGowan, Shannon A, PA-C  vitamin B-12 (CYANOCOBALAMIN) 1000 MCG tablet Take 1,000 mcg by mouth daily.    [provider]    Allergies Lac bovis; Milk-related compounds; Omeprazole; and Penicillins  Family History  Problem Relation Age of Onset  . Colon cancer Father 2350  . Arthritis Father   . Hyperlipidemia Father   . Transient ischemic attack Father   . Cancer - Colon Father 6855  . Prostate cancer Father 5268  . Valvular heart disease Father   . Arthritis Mother   . Heart disease Mother   . Stroke Mother        TIA  . Hypertension Mother   . Diabetes Mother   . Kidney cancer Mother 3245  . Heart failure Mother   . Hyperlipidemia Mother   . Cancer Maternal Grandmother   . Cancer Paternal Grandfather        prostate  . Breast cancer Cousin 2235    Social History Social History   Tobacco Use  . Smoking status: Never Smoker  . Smokeless tobacco: Never Used  Substance Use Topics  . Alcohol use: No  . Drug use: No    Review of Systems  Constitutional: Negative for fever. Eyes: Negative for visual changes. ENT: Negative for sore throat. Cardiovascular: Negative for chest pain. Respiratory: Negative for shortness of breath. Gastrointestinal: Negative for abdominal pain, vomiting and diarrhea. Genitourinary: Negative for dysuria. Musculoskeletal: Negative for back pain. Chest wall pain Skin: Negative for rash. Neurological: Negative for  focal weakness or numbness. Reports headache as above.  ____________________________________________  PHYSICAL EXAM:  VITAL SIGNS: ED Triage Vitals  Enc Vitals Group     BP 08/14/18 1809 (!) 141/77     Pulse Rate 08/14/18 1809 76     Resp 08/14/18 1809 16     Temp 08/14/18 1809 98.7 F (37.1 C)     Temp Source 08/14/18 1809 Oral     SpO2 08/14/18 1809 99 %     Weight 08/14/18 1804 151 lb (68.5 kg)     Height 08/14/18 1804 5\' 10"  (1.778 m)     Head Circumference --      Peak Flow --      Pain Score 08/14/18 1804 10     Pain Loc --      Pain Edu? --      Excl. in GC? --     Constitutional: Alert and oriented. Well appearing and in no distress. Head: Normocephalic and atraumatic. Eyes:  Conjunctivae are normal. PERRL. Normal extraocular movements Ears: Canals clear. TMs intact bilaterally. Nose: No congestion/rhinorrhea/epistaxis. Mouth/Throat: Mucous membranes are moist. Neck: Supple. No thyromegaly. Normal ROM without  Cardiovascular: Normal rate, regular rhythm. Normal distal pulses. Respiratory: Normal respiratory effort. No wheezes/rales/rhonchi. Gastrointestinal: Soft and nontender. No distention. Musculoskeletal: Nontender with normal range of motion in all extremities.  Neurologic:  Normal gait without ataxia. Normal speech and language. No gross focal neurologic deficits are appreciated. Skin:  Skin is warm, dry and intact. No rash noted. Psychiatric: Mood is anxious and affect is flat. Patient exhibits appropriate insight and judgment. ____________________________________________   LABS (pertinent positives/negatives) Labs Reviewed - No data to display ____________________________________________   RADIOLOGY  CXR Negative  I, Lamari Youngers V Bacon-Roselin Wiemann, personally viewed and evaluated these images (plain radiographs) as part of my medical decision making, as well as reviewing the written report by the  radiologist. ____________________________________________  PROCEDURES  Procedures Tylenol 650 mg PO Reglan 5 mg PO Benadryl 25 mg PO ____________________________________________  INITIAL IMPRESSION / ASSESSMENT AND PLAN / ED COURSE  Patient with ED evaluation of chronic headaches as well as persistent, chronic anterior chest wall pain per patient seems overall benign and reassuring at this time.  X-ray negative for any acute cardiopulmonary process.  Patient has been worked up in the past for headaches including an MRI done in February of this year.  She also has a history of anxiety is currently being treated with Lexapro.  Patient is reassured that her symptoms are normal and her x-ray is reassuring.  She will follow-up with her primary provider for ongoing symptoms prescription for Reglan provided for her headache management.  Return precautions have been reviewed. ____________________________________________  FINAL CLINICAL IMPRESSION(S) / ED DIAGNOSES  Final diagnoses:  Chronic nonintractable headache, unspecified headache type  Chest wall pain, chronic      Chaun Uemura, Charlesetta Ivory, PA-C 08/14/18 2051    Nita Sickle, MD 08/14/18 2122

## 2018-08-14 NOTE — ED Notes (Signed)
PA at bedside.

## 2018-08-14 NOTE — Discharge Instructions (Addendum)
Your exam and XR are normal today. Take your home meds as prescribed. Consider OTC Tylenol and Benadryl for headache pain. Follow-up with your provider for ongoing symptoms. Return to the ED as needed.

## 2018-08-14 NOTE — Progress Notes (Signed)
LMTCB so I can schedule patient for 4-6 week follow-up.

## 2018-08-14 NOTE — ED Triage Notes (Signed)
Pt presents to ED via POV with c/o HA, SOB, nausea, abdominal pain. Pt states seen 3 days ago and worsening of symptoms. Pt states indirect exposure to Covid but denies direct exposure. Pt is alert and oriented at this time, NAD noted at this time. Pt also c/o continued chest pain at this time.

## 2018-08-16 ENCOUNTER — Telehealth: Payer: Self-pay | Admitting: Family

## 2018-08-16 NOTE — Progress Notes (Signed)
Once again [facility] to schedule f/u for anxiety in 4-6 weeks.

## 2018-08-16 NOTE — Telephone Encounter (Signed)
Copied from CRM [id number]. Topic: Quick Communication - See Telephone Encounter >> Aug 16, 2018 12:26 PM Louie Bun, Rosey Bath D wrote: CRM for notification. See Telephone encounter for: 08/16/18. Patient called and would like to know if the medication Lexapro should be in her system now or how ling does it take for it to start kicking in. Please call patient back, thanks.

## 2018-08-16 NOTE — Telephone Encounter (Signed)
Called and spoke with pt and informed her that the medication Lexapro will take 1-2 weeks to notice a difference she states she is experiencing a little bit of shaking, not severe  I informed her that It is a common side effect but anything other or more severe please let us know. The patient also scheduled a f/up while on the phone for a virtual in 4-6 weeks.  Nina,cma

## 2018-08-16 NOTE — Telephone Encounter (Signed)
Agree with Cristina Wade's advice  Please offer sooner appt if she would like

## 2018-08-16 NOTE — Progress Notes (Signed)
Virtual Visit via Telephone Note  I connected with Espn Whritenour on 08/13/2018 at 1447 by telephone as attempt to contact with audio/visual technology but it was unsuccessful and verified that I am speaking with the correct person using two identifiers.  They are located at home.  I am located at my home.    This visit type was conducted due to national recommendations for restrictions regarding the COVID-19 Pandemic (e.g. social distancing).  This format is felt to be most appropriate for this patient at this time.  All issues noted in this document were discussed and addressed.  No physical exam was performed.   I discussed the limitations, risks, security and privacy concerns of performing an evaluation and management service by telephone and the availability of in person appointments. I also discussed with the patient that there may be a patient responsible charge related to this service. The patient expressed understanding and agreed to proceed.   History of Present Illness: Ms. Costilow is a 48 year old female with dysfunctional voiding, chronic constipation, history of hematuria and pelvic floor dysfunction who is requesting an appointment to discuss why she continues to have UTI's.   She has had three UTI's since February 2020.  She has been seen in the ED on two occassions for urinary complaints over the last two months.    Urine cultures have been positive for Klebsiella, S.Agalactiae and Beta hemolytic streptococcus, group B.  She has had two CT Renal stone studies which noted a few tiny bilateral renal calculi, but there was moderate stool burden.    She states that her UTI symptoms consist of malodorous urine and her kidneys hurting.  Patient denies any gross hematuria, dysuria or suprapubic/flank pain.  Patient denies any fevers, chills, nausea or vomiting.  She is drinking several glasses of water, but she is only taking stool softeners as needed and goes for two to three days without having  a BM.    She does not soak in the tub, but she states it is sometimes difficult to wipe herself correctly "front to back" and times.    She also has been given a prescription for tamsulosin by her PCP.       Observations/Objective: Ms. Bricco is answering questions appropriately and does not sound distressed.    Assessment and Plan:  1. rUTI's - discussed the role of constipation as it pertains to bladder dysfunction and rUTI's  - advised her to start taking Metamucil as recommended by GI and to take the stool softener daily to help clear the bowel of stool - continue the tamsulosin 0.4 mg daily   Follow Up Instructions:  Ms. Murden will follow up in one month for PVR and symptom recheck.  She will contact our office if she should experience any symptoms of an UTI.   I discussed the assessment and treatment plan with the patient. The patient was provided an opportunity to ask questions and all were answered. The patient agreed with the plan and demonstrated an understanding of the instructions.   The patient was advised to call back or seek an in-person evaluation if the symptoms worsen or if the condition fails to improve as anticipated.  I provided 24 minutes of non-face-to-face time during this encounter.   Everlene Cunning, PA-C

## 2018-08-16 NOTE — Telephone Encounter (Signed)
I called and left detailed VM that if she needed we could move appointment up. I did advise that Cristina Wade's advise was correct & it did take medication a few weeks to months to feel full effect.

## 2018-08-19 NOTE — Progress Notes (Signed)
Patient scheduled.

## 2018-09-12 ENCOUNTER — Ambulatory Visit: Payer: BC Managed Care – PPO | Admitting: Urology

## 2018-09-19 ENCOUNTER — Telehealth: Payer: Self-pay | Admitting: Urology

## 2018-09-19 NOTE — Telephone Encounter (Signed)
Pt. Called with respect to a call she received from clerical to change her upcoming appt.with PA Carollee Herter from in-office to virtual as requested. She raised concern that she was supposed to have blood and urine lab work done during the in office visit, and wanted instruction on how to go about getting those while also doing the virtual visit.  Please Advise  Best Number -  [phone]

## 2018-09-19 NOTE — Telephone Encounter (Signed)
No.  We need to keep her as a virtual.  I would like to talk with her first and then come back in July for a follow up in the office.

## 2018-09-20 ENCOUNTER — Ambulatory Visit: Payer: BC Managed Care – PPO | Admitting: Family

## 2018-09-20 NOTE — Telephone Encounter (Signed)
Patient notified she will keep virtual appointment

## 2018-09-26 ENCOUNTER — Telehealth: Payer: Self-pay | Admitting: Urology

## 2018-09-26 ENCOUNTER — Ambulatory Visit: Payer: BC Managed Care – PPO | Admitting: Urology

## 2018-09-26 ENCOUNTER — Other Ambulatory Visit: Payer: Self-pay

## 2018-09-26 ENCOUNTER — Telehealth (INDEPENDENT_AMBULATORY_CARE_PROVIDER_SITE_OTHER): Payer: BC Managed Care – PPO | Admitting: Urology

## 2018-09-26 DIAGNOSIS — N390 Urinary tract infection, site not specified: Secondary | ICD-10-CM

## 2018-09-26 DIAGNOSIS — Z87442 Personal history of urinary calculi: Secondary | ICD-10-CM

## 2018-09-26 NOTE — Telephone Encounter (Signed)
Would you call [name] and schedule an appointment with me in July for KUB and PVR?

## 2018-09-26 NOTE — Progress Notes (Signed)
Virtual Visit via Telephone Note  I connected with Cristina Wade on 09/26/2018 at 1324 by telephone as attempt to contact with audio/visual technology but it was unsuccessful and verified that I am speaking with the correct person using two identifiers.  They are located at home.  I am located at my home.    This visit type was conducted due to national recommendations for restrictions regarding the COVID-19 Pandemic (e.g. social distancing).  This format is felt to be most appropriate for this patient at this time.  All issues noted in this document were discussed and addressed.  No physical exam was performed.   I discussed the limitations, risks, security and privacy concerns of performing an evaluation and management service by telephone and the availability of in person appointments. I also discussed with the patient that there may be a patient responsible charge related to this service. The patient expressed understanding and agreed to proceed.   History of Present Illness: Cristina Wade is a 48 year old female with dysfunctional voiding, chronic constipation, history of hematuria, pelvic floor dysfunction and rUTI's who is contacted by telephone for a follow up.    She has been placed on Colace four times daily and MiraLax in an effort to relieve her chronic constipation.  She was advised that it could take up to 6 weeks for effect.    She has not had an UTI since our last conversation.  Patient denies any gross hematuria, dysuria or suprapubic/flank pain.  Patient denies any fevers, chills, nausea or vomiting.   She is still taking the tamsulosin 0.4 mg daily.     Observations/Objective: Cristina Wade is answering questions appropriately and does not sound distressed.    Assessment and Plan:  1. rUTI's Currently on a bowel regimen for her chronic constipation  Asymptomatic at this time  Follow Up Instructions:  Cristina Wade will follow up in July with KUB and PVR  I discussed the assessment  and treatment plan with the patient. The patient was provided an opportunity to ask questions and all were answered. The patient agreed with the plan and demonstrated an understanding of the instructions.   The patient was advised to call back or seek an in-person evaluation if the symptoms worsen or if the condition fails to improve as anticipated.  I provided 5 minutes of non-face-to-face time during this encounter.   Tyeesha Riker, PA-C

## 2018-09-30 ENCOUNTER — Ambulatory Visit (INDEPENDENT_AMBULATORY_CARE_PROVIDER_SITE_OTHER): Payer: BC Managed Care – PPO | Admitting: Family

## 2018-09-30 ENCOUNTER — Ambulatory Visit: Payer: BC Managed Care – PPO | Admitting: Family

## 2018-09-30 ENCOUNTER — Encounter: Payer: Self-pay | Admitting: Family

## 2018-09-30 DIAGNOSIS — Z87440 Personal history of urinary (tract) infections: Secondary | ICD-10-CM

## 2018-09-30 DIAGNOSIS — R51 Headache: Secondary | ICD-10-CM

## 2018-09-30 DIAGNOSIS — F419 Anxiety disorder, unspecified: Secondary | ICD-10-CM

## 2018-09-30 MED ORDER — ESCITALOPRAM OXALATE 10 MG PO TABS: 10.0000 mg | ORAL_TABLET | Freq: Every day | ORAL | 1 refills | Status: DC

## 2018-09-30 NOTE — Progress Notes (Signed)
This visit type was conducted due to national recommendations for restrictions regarding the COVID-19 pandemic (e.g. social distancing).  This format is felt to be most appropriate for this patient at this time.  All issues noted in this document were discussed and addressed.  No physical exam was performed (except for noted visual exam findings with Video Visits). Virtual Visit via Video Note  I connected with@  on 09/30/18 at  3:30 PM EDT by a video enabled telemedicine application and verified that I am speaking with the correct person using two identifiers.  Location patient: home Location provider:work  Persons participating in the virtual visit: patient, provider  I discussed the limitations of evaluation and management by telemedicine and the availability of in person appointments. The patient expressed understanding and agreed to proceed.  Interactive audio and video telecommunications were attempted between this provider and patient, however failed, due to patient having technical difficulties or patient did not have access to video capability.  We continued and completed visit with audio only.   HPI:  Feels well today, no new concerns  GAD- improved on lexapro. 'whole lot better.' Sleeping well. No si/hi.   Virtual with [employer].   Recurrent UTI- Follows with Zara Council. thought constipation was triggering recurrent UTI   HA- improved. Was given sumatriptan 100mg  by neurology; only had to had to use once.    ROS: See pertinent positives and negatives per HPI.  Past Medical History:  Diagnosis Date  . Asthma   . Chronic pelvic pain in female 2018  . Cystitis   . Cystocele   . Endometrial polyp   . Endometriosis    per pt report but never seen during surg  . Frequent headaches   . Gastritis   . GERD (gastroesophageal reflux disease)    rare  . Gross hematuria   . History of kidney stones   . Microscopic hematuria   . Migraine   . Migraines   .  Osteopenia   . Urinary disorder   . UTI (lower urinary tract infection)     Past Surgical History:  Procedure Laterality Date  . COLONOSCOPY N/A 11/05/2014   Procedure: COLONOSCOPY;  Surgeon: Manya Silvas, MD;  Location: [facility] ENDOSCOPY;  Service: Endoscopy;  Laterality: N/A;  . COLONOSCOPY  11/2016   Dr. Vira Agar  . CYSTOSCOPY  2007   with biopsy   . CYSTOSCOPY N/A 06/01/2016   Procedure: CYSTOSCOPY;  Surgeon: Malachy Mood, MD;  Location: [facility] ORS;  Service: Gynecology;  Laterality: N/A;  . DIAGNOSTIC LAPAROSCOPY    . DILATATION & CURETTAGE/HYSTEROSCOPY WITH MYOSURE N/A 04/13/2015   Procedure: DILATATION & CURETTAGE/HYSTEROSCOPY WITH MYOSURE/POLYPECTOMY;  Surgeon: Honor Loh Ward, MD;  Location: [facility] ORS;  Service: Gynecology;  Laterality: N/A;  . DILATATION & CURETTAGE/HYSTEROSCOPY WITH MYOSURE N/A 06/01/2016   Procedure: DILATATION & CURETTAGE/HYSTEROSCOPY WITH MYOSURE;  Surgeon: Malachy Mood, MD;  Location: [facility] ORS;  Service: Gynecology;  Laterality: N/A;  . GUM SURGERY    . laproscopy  2007  . POLYPECTOMY     endometrial    Family History  Problem Relation Age of Onset  . Colon cancer Father 68  . Arthritis Father   . Hyperlipidemia Father   . Transient ischemic attack Father   . Cancer - Colon Father 74  . Prostate cancer Father 2  . Valvular heart disease Father   . Arthritis Mother   . Heart disease Mother   . Stroke Mother        TIA  .  Hypertension Mother   . Diabetes Mother   . Kidney cancer Mother 6745  . Heart failure Mother   . Hyperlipidemia Mother   . Cancer Maternal Grandmother   . Cancer Paternal Grandfather        prostate  . Breast cancer Cousin 35    SOCIAL HX: never smoker   Current Outpatient Medications:  .  Calcium Carbonate-Vit D-Min (CALCIUM 1200 PO), Take 2-3 tablets by mouth daily. Reported on 05/26/2015, Disp: , Rfl:  .  docusate sodium (COLACE) 100 MG capsule, Take 1 capsule by mouth QID. , Disp: , Rfl:  .  escitalopram  (LEXAPRO) 10 MG tablet, Take 1 tablet (10 mg total) by mouth daily., Disp: 90 tablet, Rfl: 1 .  ferrous sulfate 325 (65 FE) MG tablet, Take 325 mg by mouth daily., Disp: , Rfl:  .  levonorgestrel-ethinyl estradiol (JOLESSA) 0.15-0.03 MG tablet, Take 1 tablet by mouth daily., Disp: 1 Package, Rfl: 3 .  SUMAtriptan (IMITREX) 100 MG tablet, Take 100 mg by mouth every 2 (two) hours as needed. May repeat in 2 hours if headache persists or recurs., Disp: , Rfl:  .  tamsulosin (FLOMAX) 0.4 MG CAPS capsule, Take 1 capsule (0.4 mg total) by mouth daily., Disp: 30 capsule, Rfl: 3 .  metoCLOPramide (REGLAN) 5 MG tablet, Take 1 tablet (5 mg total) by mouth every 8 (eight) hours as needed for up to 2 days for nausea or vomiting., Disp: 6 tablet, Rfl: 0 .  vitamin B-12 (CYANOCOBALAMIN) 1000 MCG tablet, Take 1,000 mcg by mouth daily., Disp: , Rfl:     ASSESSMENT AND PLAN:  Discussed the following assessment and plan:  Problem List Items Addressed This Visit      Other   Anxiety    Pleased as improved.  She will see counseling as well.  Will follow      Relevant Medications   escitalopram (LEXAPRO) 10 MG tablet        I discussed the assessment and treatment plan with the patient. The patient was provided an opportunity to ask questions and all were answered. The patient agreed with the plan and demonstrated an understanding of the instructions.   The patient was advised to call back or seek an in-person evaluation if the symptoms worsen or if the condition fails to improve as anticipated.   Rennie PlowmanMargaret , FNP   I spent 20 min non face to face w/ pt.

## 2018-09-30 NOTE — Patient Instructions (Signed)
So glad you are doing well.

## 2018-10-01 ENCOUNTER — Ambulatory Visit (INDEPENDENT_AMBULATORY_CARE_PROVIDER_SITE_OTHER): Payer: BC Managed Care – PPO | Admitting: Psychology

## 2018-10-01 DIAGNOSIS — F418 Other specified anxiety disorders: Secondary | ICD-10-CM

## 2018-10-02 NOTE — Assessment & Plan Note (Signed)
Pleased as improved.  She will see counseling as well.  Will follow

## 2018-10-09 ENCOUNTER — Telehealth: Payer: Self-pay

## 2018-10-09 NOTE — Telephone Encounter (Signed)
Pt calling for her and her twin sister; their face is dark d/t bcp.  States [employer] told them about something to put on face that would make it not be so dark - she uses it herself.  What is it?  [phone]

## 2018-10-10 NOTE — Telephone Encounter (Signed)
Pt aware.

## 2018-10-10 NOTE — Telephone Encounter (Signed)
Has melasma--due to estrogen OCPs. Must use sunscreen during summer/minimize sun on face. OTC meds won't help till fall. Can then do products containing hydroquinone (can be expensive). Sx will resolve once they stop OCPs in near future. Can tell twin sister, Santiago Glad, same thing.

## 2018-10-16 NOTE — Progress Notes (Signed)
10/17/2018 11:53 AM   [name] 19-Oct-1970 [id number]  Referring provider: Burnard Hawthorne, FNP [address] [place],  [place] [address]  Chief Complaint  Patient presents with  . Pelvic floor dysfunction    HPI: Cristina Wade is a 49 year old female with a history of hematuria, bilateral nephrolithiasis, pelvic floor dysfunction and rUTI's who presents today for a follow up.    History of hematuria Non-smoker.  Work ups in 2009, 2014 and 2015 positive for punctate bilateral stones.  No recent reports of gross hematuria.  Bilateral nephrolithiasis Stable bilateral punctate stones seen on CT Renal stone study on 08/11/2018.  KUB today (10/17/2018) was unremarkable.    Pelvic floor dysfunction She has had extensive workup in the past including urodynamics (2016) which revealed delayed first sensation at 626cc, normal bladder capacity and pressures. Mild demonstrable stress urinary incontinence was appreciated. No evidence of urgency. EMG was consistent with poor relaxation of the external sphincter consistent with pseudo-DSD associated with incomplete bladder emptying.  Shortly thereafter, she was referred to physical therapy and had 1 session. She was taught some techniques including raising her feet above the level of her bladder when voiding and some core exercises.  She tried this for approximate 2 weeks and it did not help so she stopped.  She underwent cystoscopy, excision of cervical polyp by Dr. Star Age in 05/2016.  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     She does have severe chronic constipation and was recently started on a bowel regimen which is proving successful.    Had a normal US Pelvis on 10/12/17.  US Renal completed on 02/20/18 with results revealing normal ultrasound appearance of both kidneys and the urinary bladder.  rUTI's 01/30/2018 - staphylococcus warneri and beta  hemolytic streptococcus, group B 02/07/2018 - beta hemolytic streptococcus, group B 04/29/2018- streptococcus agalectiae 06/13/2018 - klebsiella pneumoniae and streptococcus agalectiae resistant to ampicillin  06/22/2018 - streptococcus agalactiae 07/29/1008 - beta hemolytic streptococcus, group B  Risk factors: severe constipation and incomplete bladder emptying,   Today, she has no complaints.  Patient denies any gross hematuria, dysuria or suprapubic/flank pain.  Patient denies any fevers, chills, nausea or vomiting.  Her PVR is 57 mL.  UA 6-10 WBC's.   PMH: Past Medical History:  Diagnosis Date  . Asthma   . Chronic pelvic pain in female 2018  . Cystitis   . Cystocele   . Endometrial polyp   . Endometriosis    per pt report but never seen during surg  . Frequent headaches   . Gastritis   . GERD (gastroesophageal reflux disease)    rare  . Gross hematuria   . History of kidney stones   . Microscopic hematuria   . Migraine   . Migraines   . Osteopenia   . Urinary disorder   . UTI (lower urinary tract infection)     Surgical History: Past Surgical History:  Procedure Laterality Date  . COLONOSCOPY N/A 11/05/2014   Procedure: COLONOSCOPY;  Surgeon: Manya Silvas, MD;  Location: [facility] ENDOSCOPY;  Service: Endoscopy;  Laterality: N/A;  . COLONOSCOPY  11/2016   Dr. Vira Agar  . CYSTOSCOPY  2007   with biopsy   . CYSTOSCOPY N/A 06/01/2016   Procedure: CYSTOSCOPY;  Surgeon: Malachy Mood, MD;  Location: [facility] ORS;  Service: Gynecology;  Laterality: N/A;  . DIAGNOSTIC LAPAROSCOPY    . DILATATION &  CURETTAGE/HYSTEROSCOPY WITH MYOSURE N/A 04/13/2015   Procedure: DILATATION & CURETTAGE/HYSTEROSCOPY WITH MYOSURE/POLYPECTOMY;  Surgeon: Honor Loh Ward, MD;  Location: [facility] ORS;  Service: Gynecology;  Laterality: N/A;  . DILATATION & CURETTAGE/HYSTEROSCOPY WITH MYOSURE N/A 06/01/2016   Procedure: DILATATION & CURETTAGE/HYSTEROSCOPY WITH MYOSURE;  Surgeon: Malachy Mood, MD;   Location: [facility] ORS;  Service: Gynecology;  Laterality: N/A;  . GUM SURGERY    . laproscopy  2007  . POLYPECTOMY     endometrial    Home Medications:  Allergies as of 10/17/2018      Reactions   Lac Bovis Other (See Comments)   Milk-related Compounds    Omeprazole Other (See Comments)   Pt reports "Chest pain, HA and Dysuria."   Penicillins Itching, Rash      Medication List       Accurate as of October 17, 2018 11:53 AM. If you have any questions, ask your nurse or doctor.        CALCIUM 1200 PO Take 2-3 tablets by mouth daily. Reported on 05/26/2015   docusate sodium 100 MG capsule Commonly known as: COLACE Take 1 capsule by mouth QID.   escitalopram 10 MG tablet Commonly known as: Lexapro Take 1 tablet (10 mg total) by mouth daily.   ferrous sulfate 325 (65 FE) MG tablet Take 325 mg by mouth daily.   levonorgestrel-ethinyl estradiol 0.15-0.03 MG tablet Commonly known as: Jolessa Take 1 tablet by mouth daily.   metoCLOPramide 5 MG tablet Commonly known as: Reglan Take 1 tablet (5 mg total) by mouth every 8 (eight) hours as needed for up to 2 days for nausea or vomiting.   SUMAtriptan 100 MG tablet Commonly known as: IMITREX Take 100 mg by mouth every 2 (two) hours as needed. May repeat in 2 hours if headache persists or recurs.   tamsulosin 0.4 MG Caps capsule Commonly known as: FLOMAX Take 1 capsule (0.4 mg total) by mouth daily.   vitamin B-12 1000 MCG tablet Commonly known as: CYANOCOBALAMIN Take 1,000 mcg by mouth daily.       Allergies:  Allergies  Allergen Reactions  . Lac Bovis Other (See Comments)  . Milk-Related Compounds   . Omeprazole Other (See Comments)    Pt reports "Chest pain, HA and Dysuria."  . Penicillins Itching and Rash    Family History: Family History  Problem Relation Age of Onset  . Colon cancer Father 18  . Arthritis Father   . Hyperlipidemia Father   . Transient ischemic attack Father   . Cancer - Colon Father 17  .  Prostate cancer Father 21  . Valvular heart disease Father   . Arthritis Mother   . Heart disease Mother   . Stroke Mother        TIA  . Hypertension Mother   . Diabetes Mother   . Kidney cancer Mother 70  . Heart failure Mother   . Hyperlipidemia Mother   . Cancer Maternal Grandmother   . Cancer Paternal Grandfather        prostate  . Breast cancer Cousin 62    Social History:  reports that she has never smoked. She has never used smokeless tobacco. She reports that she does not drink alcohol or use drugs.  ROS: UROLOGY Frequent Urination?: No Hard to postpone urination?: No Burning/pain with urination?: No Get up at night to urinate?: No Leakage of urine?: No Urine stream starts and stops?: No Trouble starting stream?: No Do you have to strain to urinate?: No Blood  in urine?: No Urinary tract infection?: No Sexually transmitted disease?: No Injury to kidneys or bladder?: No Painful intercourse?: No Weak stream?: No Currently pregnant?: No Vaginal bleeding?: No Last menstrual period?: n  Gastrointestinal Nausea?: No Vomiting?: No Indigestion/heartburn?: No Diarrhea?: No Constipation?: Yes  Constitutional Fever: No Night sweats?: No Weight loss?: No Fatigue?: No  Skin Skin rash/lesions?: No Itching?: No  Eyes Blurred vision?: No Double vision?: No  Ears/Nose/Throat Sore throat?: No Sinus problems?: No  Hematologic/Lymphatic Swollen glands?: No Easy bruising?: No  Cardiovascular Leg swelling?: No Chest pain?: No  Respiratory Cough?: No Shortness of breath?: No  Endocrine Excessive thirst?: No  Musculoskeletal Back pain?: No Joint pain?: No  Neurological Headaches?: No Dizziness?: No  Psychologic Depression?: No Anxiety?: Yes  Physical Exam: BP 115/70 (BP Location: Left Arm, Patient Position: Sitting, Cuff Size: Normal)   Pulse 71   Ht 5' 10" (1.778 m)   Wt 150 lb (68 kg)   BMI 21.52 kg/m   Constitutional:  Well  nourished. Alert and oriented, No acute distress. HEENT: [place] AT, moist mucus membranes.  Trachea midline, no masses. Cardiovascular: No clubbing, cyanosis, or edema. Respiratory: Normal respiratory effort, no increased work of breathing. Neurologic: Grossly intact, no focal deficits, moving all 4 extremities. Psychiatric: Normal mood and affect.   Laboratory Data: Lab Results  Component Value Date   WBC 6.2 08/11/2018   HGB 13.1 08/11/2018   HCT 40.2 08/11/2018   MCV 92.4 08/11/2018   PLT 216 08/11/2018    Lab Results  Component Value Date   CREATININE 0.79 08/11/2018    No results found for: PSA  No results found for: TESTOSTERONE  Lab Results  Component Value Date   HGBA1C 5.4 06/03/2018    Lab Results  Component Value Date   TSH 1.46 06/03/2018       Component Value Date/Time   CHOL 178 11/29/2016 0753   HDL 50.60 11/29/2016 0753   CHOLHDL 4 11/29/2016 0753   VLDL 14.0 11/29/2016 0753   LDLCALC 113 (H) 11/29/2016 0753    Lab Results  Component Value Date   AST 15 08/11/2018   Lab Results  Component Value Date   ALT 12 08/11/2018   No components found for: ALKALINEPHOPHATASE No components found for: BILIRUBINTOTAL  No results found for: ESTRADIOL  Urinalysis Bland.  See Epic. I have reviewed the labs.   Pertinent Imaging: CLINICAL DATA:  History of renal calculi.  EXAM: ABDOMEN - 1 VIEW  COMPARISON:  CT scan 08/11/2018.  FINDINGS: The bowel gas pattern is unremarkable. Moderate stool throughout the colon. No definite renal or ureteral calculi are identified. Calcification in the left lower pelvis is likely phlebolith and unchanged since prior CT. The bony structures are intact  IMPRESSION: Unremarkable abdominal radiograph.   Electronically Signed   By: Marijo Sanes M.D.   On: 10/17/2018 16:09 I have independently reviewed the films.    Assessment & Plan:    1. Pelvic floor dysfunction Completed PT Continue Flomax  Continue Bowel regimine  2. Bilateral nephrolithiasis Not visible on current KUB Follow up in one year with KUB Patient is advised that if they should start to experience pain that is not able to be controlled with pain medication, intractable nausea and/or vomiting and/or fevers greater than 103 or shaking chills to contact the office immediately or seek treatment in the emergency department for emergent intervention.    3. rUT's Criteria for recurrent UTI has been met with 2 or more infections in 6  months or 3 or greater infections in one year - asymptomatic at this time  Patient to continue keep their water intake until the urine is pale yellow or clear (10 to 12 cups daily)  Patient is instructed to take probiotics (yogurt, oral pills or vaginal suppositories), take cranberry pills or drink the juice and Vitamin C 1,000 mg daily to acidify the urine  Avoid soaking in tubs and wipe front to back after urinating  Has attended PT therapy Advised her to contact our office for symptoms of an UTI - (fevers, chills, gross hematuria, mental status changes, dysuria, suprapubic pain, back pain and/or sudden worsening of urinary symptoms) and advised not to have urine checked or be treated for UTI if not experiencing symptoms Continue with bowel regimine as constipation is likely a major contributor to UTI's  4. Constipation Followed by GI                                            Return in about 1 year (around 10/17/2019) for KUB, UA, PVR and UA .  These notes generated with voice recognition software. I apologize for typographical errors.  Zara Council, PA-C  [facility] [address]  [place], [place] [address] [phone]

## 2018-10-17 ENCOUNTER — Ambulatory Visit
Admission: RE | Admit: 2018-10-17 | Discharge: 2018-10-17 | Disposition: A | Discharge status: Home / Self Care | Payer: BC Managed Care – PPO | Source: Ambulatory Visit | Attending: Urology | Admitting: Urology

## 2018-10-17 ENCOUNTER — Encounter: Payer: Self-pay | Admitting: Urology

## 2018-10-17 ENCOUNTER — Ambulatory Visit (INDEPENDENT_AMBULATORY_CARE_PROVIDER_SITE_OTHER): Payer: BC Managed Care – PPO | Admitting: Urology

## 2018-10-17 ENCOUNTER — Other Ambulatory Visit: Payer: Self-pay

## 2018-10-17 VITALS — BP 115/70 | HR 71 | Ht 70.0 in | Wt 150.0 lb

## 2018-10-17 DIAGNOSIS — Z87442 Personal history of urinary calculi: Secondary | ICD-10-CM | POA: Insufficient documentation

## 2018-10-17 DIAGNOSIS — M6289 Other specified disorders of muscle: Secondary | ICD-10-CM

## 2018-10-17 DIAGNOSIS — K5909 Other constipation: Secondary | ICD-10-CM | POA: Diagnosis not present

## 2018-10-17 DIAGNOSIS — N200 Calculus of kidney: Secondary | ICD-10-CM | POA: Diagnosis not present

## 2018-10-17 DIAGNOSIS — N390 Urinary tract infection, site not specified: Secondary | ICD-10-CM | POA: Diagnosis not present

## 2018-10-17 LAB — URINALYSIS, COMPLETE
Bilirubin, UA: NEGATIVE
Glucose, UA: NEGATIVE
Ketones, UA: NEGATIVE
Nitrite, UA: NEGATIVE
Protein,UA: NEGATIVE
Specific Gravity, UA: 1.01 (ref 1.005–1.030)
Urobilinogen, Ur: 0.2 mg/dL (ref 0.2–1.0)
pH, UA: 7 (ref 5.0–7.5)

## 2018-10-17 LAB — MICROSCOPIC EXAMINATION: Bacteria, UA: NONE SEEN

## 2018-10-17 LAB — BLADDER SCAN AMB NON-IMAGING: Scan Result: 57

## 2018-10-17 MED ORDER — TAMSULOSIN HCL 0.4 MG PO CAPS: 0.4000 mg | ORAL_CAPSULE | Freq: Every day | ORAL | 3 refills | Status: DC

## 2018-10-18 ENCOUNTER — Ambulatory Visit (INDEPENDENT_AMBULATORY_CARE_PROVIDER_SITE_OTHER): Payer: BC Managed Care – PPO | Admitting: Psychology

## 2018-10-18 DIAGNOSIS — F418 Other specified anxiety disorders: Secondary | ICD-10-CM | POA: Diagnosis not present

## 2018-10-19 ENCOUNTER — Ambulatory Visit: Payer: BC Managed Care – PPO | Admitting: Psychology

## 2018-11-01 ENCOUNTER — Telehealth: Payer: Self-pay | Admitting: Family

## 2018-11-01 NOTE — Telephone Encounter (Signed)
Call pt  I am  Confused by note   She had felt better in regards to anxiety on lexapro.   I would advise we discuss the why is would like to come off  Please sch doxy

## 2018-11-01 NOTE — Telephone Encounter (Signed)
Relation to pt: self  Call back number: [phone]   Reason for call:  Cristina Irish, LCSW [facility] wanted patient to inquire with PCP how would she taper off escitalopram (LEXAPRO) 10 MG tablet , please advise

## 2018-11-04 ENCOUNTER — Ambulatory Visit (INDEPENDENT_AMBULATORY_CARE_PROVIDER_SITE_OTHER): Payer: BC Managed Care – PPO | Admitting: Psychology

## 2018-11-04 DIAGNOSIS — F418 Other specified anxiety disorders: Secondary | ICD-10-CM | POA: Diagnosis not present

## 2018-11-05 NOTE — Telephone Encounter (Signed)
Called and spoke to pt.  Pt said that she does not want to stop taking Lexapro.  Pt said that she is doing better on it.  Pt said that Buena Irish, LCSW asked her if she knew how to taper off of meds in case she ever wanted to stop them.    Pt said that she does not need an appt she just needs instructions on how to taper off of Lexapro in case she ever decided to stop the meds.

## 2018-11-06 NOTE — Telephone Encounter (Signed)
Patient stated that she did not want to stop medication & that she felt better on it. She just wanted directions on how in case she did.

## 2018-11-06 NOTE — Telephone Encounter (Signed)
Call patient If she would like understanding of how taper off medication in the future, she may follow the below.    She would take Lexapro 10 mg every other day for 1 week.    The following week she may take Lexapro 10 mg every third day for one week  If  She feels well at that time she will stop medication.

## 2018-11-18 ENCOUNTER — Telehealth: Payer: Self-pay | Admitting: Urology

## 2018-11-18 NOTE — Telephone Encounter (Signed)
Pt office complaining of frequency and burning w/urination and lower back pain.  She was added to nurse schedule tomorrow.

## 2018-11-19 ENCOUNTER — Ambulatory Visit: Payer: BC Managed Care – PPO

## 2018-11-19 ENCOUNTER — Ambulatory Visit: Payer: BC Managed Care – PPO | Admitting: Physician Assistant

## 2018-11-21 ENCOUNTER — Ambulatory Visit: Payer: BC Managed Care – PPO | Admitting: Physician Assistant

## 2018-11-22 ENCOUNTER — Ambulatory Visit (INDEPENDENT_AMBULATORY_CARE_PROVIDER_SITE_OTHER): Payer: BC Managed Care – PPO | Admitting: Physician Assistant

## 2018-11-22 ENCOUNTER — Other Ambulatory Visit: Payer: Self-pay

## 2018-11-22 ENCOUNTER — Encounter: Payer: Self-pay | Admitting: Physician Assistant

## 2018-11-22 VITALS — BP 110/70 | HR 59 | Ht 70.0 in | Wt 149.6 lb

## 2018-11-22 DIAGNOSIS — M6289 Other specified disorders of muscle: Secondary | ICD-10-CM | POA: Diagnosis not present

## 2018-11-22 DIAGNOSIS — N390 Urinary tract infection, site not specified: Secondary | ICD-10-CM | POA: Diagnosis not present

## 2018-11-22 DIAGNOSIS — R3989 Other symptoms and signs involving the genitourinary system: Secondary | ICD-10-CM | POA: Diagnosis not present

## 2018-11-22 DIAGNOSIS — R300 Dysuria: Secondary | ICD-10-CM | POA: Diagnosis not present

## 2018-11-22 DIAGNOSIS — R339 Retention of urine, unspecified: Secondary | ICD-10-CM

## 2018-11-22 LAB — URINALYSIS, COMPLETE
Bilirubin, UA: NEGATIVE
Glucose, UA: NEGATIVE
Ketones, UA: NEGATIVE
Leukocytes,UA: NEGATIVE
Nitrite, UA: NEGATIVE
Protein,UA: NEGATIVE
RBC, UA: NEGATIVE
Specific Gravity, UA: 1.015 (ref 1.005–1.030)
Urobilinogen, Ur: 0.2 mg/dL (ref 0.2–1.0)
pH, UA: 7.5 (ref 5.0–7.5)

## 2018-11-22 LAB — MICROSCOPIC EXAMINATION
Bacteria, UA: NONE SEEN
Epithelial Cells (non renal): NONE SEEN /hpf (ref 0–10)
RBC, Urine: NONE SEEN /hpf (ref 0–2)

## 2018-11-22 LAB — BLADDER SCAN AMB NON-IMAGING

## 2018-11-22 NOTE — Progress Notes (Signed)
11/22/2018 10:21 AM   Cristina CorpusSharon Wade [birth year] [id number]  CC: Low back burning  HPI: Cristina Wade is a 48 y.o. female who presents today for evaluation of possible UTI. She is an established [facility] patient who last saw Michiel CowboyShannon McGowan on 10/17/2018 for routine follow-up of hematuria, bilateral nephrolithiasis, pelvic floor dysfunction, and recurrent UTI.  She reports a 4-day history of burning discomfort of the lower back and worsened constipation. She denies dysuria.  She has a history of severe constipation and incomplete bladder emptying.  She states that she contacted her gastroenterologist earlier this week and was restarted on Senna for constipation 2 days ago.  She states that her symptoms have improved significantly since that time.    She does have a history of recurrent UTI, most recently on 07/30/2018 and treated with Bactrim DS twice daily x5 days. She is not taking daily antibiotic prophylaxis.   Most recent CT stone study on 08/11/2018 with bilateral punctate nonobstructing renal stones.  In-office UA and microscopy today pan-negative. PVR today initially was 400mL.  She stated she did not feel that she had a full bladder at that time and had no urge to void.  She was prompted to void again. Subsequent PVR of 78mL.  She reports resolution of the low back burning upon prompted void.  PMH: Past Medical History:  Diagnosis Date   Asthma    Chronic pelvic pain in female 2018   Cystitis    Cystocele    Endometrial polyp    Endometriosis    per pt report but never seen during surg   Frequent headaches    Gastritis    GERD (gastroesophageal reflux disease)    rare   Gross hematuria    History of kidney stones    Microscopic hematuria    Migraine    Migraines    Osteopenia    Urinary disorder    UTI (lower urinary tract infection)     Surgical History: Past Surgical History:  Procedure Laterality Date   COLONOSCOPY N/A 11/05/2014   Procedure:  COLONOSCOPY;  Surgeon: Scot Junobert T Elliott, MD;  [facility];  Service: Endoscopy;  Laterality: N/A;   COLONOSCOPY  11/2016   Dr. Mechele CollinElliott   CYSTOSCOPY  2007   with biopsy    CYSTOSCOPY N/A 06/01/2016   Procedure: CYSTOSCOPY;  Surgeon: Vena AustriaAndreas Staebler, MD;  Location: [facility] ORS;  Service: Gynecology;  Laterality: N/A;   DIAGNOSTIC LAPAROSCOPY     DILATATION & CURETTAGE/HYSTEROSCOPY WITH MYOSURE N/A 04/13/2015   Procedure: DILATATION & CURETTAGE/HYSTEROSCOPY WITH MYOSURE/POLYPECTOMY;  Surgeon: Elenora Fenderhelsea C Ward, MD;  Location: [facility] ORS;  Service: Gynecology;  Laterality: N/A;   DILATATION & CURETTAGE/HYSTEROSCOPY WITH MYOSURE N/A 06/01/2016   Procedure: DILATATION & CURETTAGE/HYSTEROSCOPY WITH MYOSURE;  Surgeon: Vena AustriaAndreas Staebler, MD;  Location: [facility] ORS;  Service: Gynecology;  Laterality: N/A;   GUM SURGERY     laproscopy  2007   POLYPECTOMY     endometrial    Home Medications:  Allergies as of 11/22/2018      Reactions   Lac Bovis Other (See Comments)   Milk-related Compounds    Omeprazole Other (See Comments)   Pt reports "Chest pain, HA and Dysuria."   Penicillins Itching, Rash      Medication List       Accurate as of November 22, 2018 10:21 AM. If you have any questions, ask your nurse or doctor.        CALCIUM 1200 PO Take 2-3 tablets by mouth daily. Reported  on 05/26/2015   dicyclomine 10 MG capsule Commonly known as: BENTYL Take by mouth.   docusate sodium 100 MG capsule Commonly known as: COLACE Take 1 capsule by mouth QID.   escitalopram 10 MG tablet Commonly known as: Lexapro Take 1 tablet (10 mg total) by mouth daily.   etodolac 500 MG tablet Commonly known as: LODINE Take by mouth.   ferrous sulfate 325 (65 FE) MG tablet Take 325 mg by mouth daily.   levonorgestrel-ethinyl estradiol 0.15-0.03 MG tablet Commonly known as: Jolessa Take 1 tablet by mouth daily.   metoCLOPramide 5 MG tablet Commonly known as: Reglan Take 1 tablet (5 mg  total) by mouth every 8 (eight) hours as needed for up to 2 days for nausea or vomiting.   SUMAtriptan 100 MG tablet Commonly known as: IMITREX Take 100 mg by mouth every 2 (two) hours as needed. May repeat in 2 hours if headache persists or recurs.   tamsulosin 0.4 MG Caps capsule Commonly known as: FLOMAX Take 1 capsule (0.4 mg total) by mouth daily.   vitamin B-12 1000 MCG tablet Commonly known as: CYANOCOBALAMIN Take 1,000 mcg by mouth daily.       Allergies:  Allergies  Allergen Reactions   Lac Bovis Other (See Comments)   Milk-Related Compounds    Omeprazole Other (See Comments)    Pt reports "Chest pain, HA and Dysuria."   Penicillins Itching and Rash    Family History: Family History  Problem Relation Age of Onset   Colon cancer Father 350   Arthritis Father    Hyperlipidemia Father    Transient ischemic attack Father    Cancer - Colon Father [phone]   Prostate cancer Father [phone]   Valvular heart disease Father    Arthritis Mother    Heart disease Mother    Stroke Mother        TIA   Hypertension Mother    Diabetes Mother    Kidney cancer Mother [phone]   Heart failure Mother    Hyperlipidemia Mother    Cancer Maternal Grandmother    Cancer Paternal Grandfather        prostate   Breast cancer Cousin [phone]    Social History:   reports that she has never smoked. She has never used smokeless tobacco. She reports that she does not drink alcohol or use drugs.  ROS: UROLOGY Frequent Urination?: No Hard to postpone urination?: No Burning/pain with urination?: No Get up at night to urinate?: No Leakage of urine?: No Urine stream starts and stops?: No Trouble starting stream?: No Do you have to strain to urinate?: No Blood in urine?: No Urinary tract infection?: No Sexually transmitted disease?: No Injury to kidneys or bladder?: No Painful intercourse?: No Weak stream?: No Currently pregnant?: No Vaginal bleeding?: No Last menstrual  period?: n  Gastrointestinal Nausea?: No Vomiting?: No Indigestion/heartburn?: No Diarrhea?: No Constipation?: Yes  Constitutional Fever: No Night sweats?: No Weight loss?: No Fatigue?: No  Skin Skin rash/lesions?: No Itching?: No  Eyes Blurred vision?: No Double vision?: No  Ears/Nose/Throat Sore throat?: No Sinus problems?: No  Hematologic/Lymphatic Swollen glands?: No Easy bruising?: No  Cardiovascular Leg swelling?: No Chest pain?: No  Respiratory Cough?: No Shortness of breath?: No  Endocrine Excessive thirst?: No  Musculoskeletal Back pain?: No Joint pain?: No  Neurological Headaches?: No Dizziness?: No  Psychologic Depression?: No Anxiety?: No  Physical Exam: BP 110/70 (BP Location: Left Arm, Patient Position: Sitting, Cuff Size: Normal)    Pulse Marland Kitchen(!)  59    Ht 5\' 10"  (1.778 m)    Wt 149 lb 9.6 oz (67.9 kg)    BMI 21.47 kg/m   Constitutional:  Alert and oriented, no acute distress, nontoxic appearing HEENT: [place], AT Cardiovascular: No clubbing, cyanosis, or edema Respiratory: Normal respiratory effort, no increased work of breathing GI: Abdomen is soft, nontender, nondistended, no abdominal masses GU: No CVA tenderness Lymph: No cervical or inguinal lymphadenopathy Skin: No rashes, bruises or suspicious lesions Neurologic: Grossly intact, no focal deficits, moving all 4 extremities Psychiatric: Normal mood and affect  Laboratory Data: Urinalysis    Component Value Date/Time   COLORURINE STRAW (A) 08/11/2018 1655   APPEARANCEUR Clear 11/22/2018 1013   LABSPEC 1.006 08/11/2018 1655   PHURINE 7.0 08/11/2018 1655   GLUCOSEU Negative 11/22/2018 1013   GLUCOSEU NEGATIVE 06/03/2018 1637   HGBUR SMALL (A) 08/11/2018 1655   BILIRUBINUR Negative 11/22/2018 1013   KETONESUR NEGATIVE 08/11/2018 1655   PROTEINUR Negative 11/22/2018 1013   PROTEINUR NEGATIVE 08/11/2018 1655   UROBILINOGEN 0.2 06/13/2018 1458   UROBILINOGEN 0.2 06/03/2018 1637     NITRITE Negative 11/22/2018 1013   NITRITE NEGATIVE 08/11/2018 1655   LEUKOCYTESUR Negative 11/22/2018 1013   LEUKOCYTESUR NEGATIVE 08/11/2018 1655   Results for orders placed or performed in visit on 11/22/18  CULTURE, URINE COMPREHENSIVE     Status: Abnormal   Collection Time: 11/22/18 10:13 AM   Specimen: Urine   UR  Result Value Ref Range Status   Urine Culture, Comprehensive Final report (A)  Final   Organism ID, Bacteria Comment (A)  Final    Comment: Beta hemolytic Streptococcus, group B 10,000-25,000 colony forming units per mL Penicillin and ampicillin are drugs of choice for treatment of beta-hemolytic streptococcal infections. Susceptibility testing of penicillins and other beta-lactam agents approved by the FDA for treatment of beta-hemolytic streptococcal infections need not be performed routinely because nonsusceptible isolates are extremely rare in any beta-hemolytic streptococcus and have not been reported for Streptococcus pyogenes (group A). (CLSI)    Organism ID, Bacteria Comment  Final    Comment: Mixed urogenital flora 1,000 Colonies/mL   Microscopic Examination     Status: None   Collection Time: 11/22/18 10:13 AM   URINE  Result Value Ref Range Status   WBC, UA 0-5 0 - 5 /hpf Final   RBC None seen 0 - 2 /hpf Final   Epithelial Cells (non renal) None seen 0 - 10 /hpf Final   Bacteria, UA None seen None seen/Few Final   Results for orders placed or performed in visit on 11/22/18  CULTURE, URINE COMPREHENSIVE   Specimen: Urine   UR  Result Value Ref Range   Urine Culture, Comprehensive Final report (A)    Organism ID, Bacteria Comment (A)    Organism ID, Bacteria Comment   Microscopic Examination   URINE  Result Value Ref Range   WBC, UA 0-5 0 - 5 /hpf   RBC None seen 0 - 2 /hpf   Epithelial Cells (non renal) None seen 0 - 10 /hpf   Bacteria, UA None seen None seen/Few  Urinalysis, Complete  Result Value Ref Range   Specific Gravity, UA  1.015 1.005 - 1.030   pH, UA 7.5 5.0 - 7.5   Color, UA Yellow Yellow   Appearance Ur Clear Clear   Leukocytes,UA Negative Negative   Protein,UA Negative Negative/Trace   Glucose, UA Negative Negative   Ketones, UA Negative Negative   RBC, UA Negative Negative  Bilirubin, UA Negative Negative   Urobilinogen, Ur 0.2 0.2 - 1.0 mg/dL   Nitrite, UA Negative Negative   Microscopic Examination See below:   BLADDER SCAN AMB NON-IMAGING  Result Value Ref Range   Scan Result 78mL    Assessment & Plan:   1. Low back discomfort UA and microscopy unconcerning for infection.  We will proceed with urine culture.  Most recent CT stone study 4 months ago with no evidence of urinary obstruction, punctate stones visualized bilaterally.  I do not suspect changes in her stone burden significant enough to explain her current complaint.  I suspect her reported symptoms are secondary to bladder distention.  Patient was able to empty her bladder satisfactorily upon prompting.  I do not believe that she needs to initiate CIC at this time.  Instead, I feel that timed voiding would resolve much of her discomfort.  In discussion with Michiel CowboyShannon McGowan, this intervention was deemed most appropriate given patient's PMH of pelvic floor dysfunction.  I counseled the patient to initiate timed voiding twice daily in addition to her routine voiding pattern.  I explained that her discomfort is likely secondary to constipation as well as bladder distention.  I encouraged her to continue with the bowel regimen prescribed by her gastroenterologist.  She expressed an understanding of this plan.  She has annual follow-up with Michiel CowboyShannon McGowan scheduled for 2021.  I encouraged her to reach out to us in the meantime with new complaints. -Urinalysis, Complete -Urine culture -PVR  Carman ChingSamantha Davia Smyre, [facility]  [facility] [address], [address] [place], [place] [address] [phone]

## 2018-11-25 LAB — CULTURE, URINE COMPREHENSIVE

## 2018-12-24 ENCOUNTER — Encounter: Payer: Self-pay | Admitting: Urology

## 2018-12-24 ENCOUNTER — Other Ambulatory Visit: Payer: Self-pay

## 2018-12-24 ENCOUNTER — Ambulatory Visit (INDEPENDENT_AMBULATORY_CARE_PROVIDER_SITE_OTHER): Payer: BC Managed Care – PPO | Admitting: Urology

## 2018-12-24 VITALS — BP 146/88 | HR 72 | Ht 70.0 in | Wt 150.0 lb

## 2018-12-24 DIAGNOSIS — R339 Retention of urine, unspecified: Secondary | ICD-10-CM | POA: Diagnosis not present

## 2018-12-24 DIAGNOSIS — N390 Urinary tract infection, site not specified: Secondary | ICD-10-CM

## 2018-12-24 LAB — BLADDER SCAN AMB NON-IMAGING: Scan Result: 13

## 2018-12-24 MED ORDER — SULFAMETHOXAZOLE-TRIMETHOPRIM 800-160 MG PO TABS: 1.0000 | ORAL_TABLET | Freq: Two times a day (BID) | ORAL | 0 refills | Status: DC

## 2018-12-24 NOTE — Progress Notes (Signed)
12/24/2018 3:56 PM   [name] 11-03-1970 [id number]  Referring provider: Burnard Hawthorne, FNP [address] [place],  [place] [address]  Chief Complaint  Patient presents with  . Other    HPI: Ms. Kistner is a 48 year old female with a history of rUTI's who presents today requesting an urgent appointment for UTI symptoms.  She states one week ago, she began to have symptoms of increased urgency and malodorous urine.  Patient denies any gross hematuria, dysuria or suprapubic/flank pain.  Patient denies any fevers, chills, nausea or vomiting.   Her UA today is nitrite positive, > 30 WBC's, 3-10 RBC's and a few bacteria.  Her PVR is 13 mL.    She is engaging in timed voids, but she feels this infection was precipitated by holding her urine for six hours at work recently.    She is currently on Linzess for her IBS and has an appointment with GI tomorrow.   PMH: Past Medical History:  Diagnosis Date  . Asthma   . Chronic pelvic pain in female 2018  . Cystitis   . Cystocele   . Endometrial polyp   . Endometriosis    per pt report but never seen during surg  . Frequent headaches   . Gastritis   . GERD (gastroesophageal reflux disease)    rare  . Gross hematuria   . History of kidney stones   . Microscopic hematuria   . Migraine   . Migraines   . Osteopenia   . Urinary disorder   . UTI (lower urinary tract infection)     Surgical History: Past Surgical History:  Procedure Laterality Date  . COLONOSCOPY N/A 11/05/2014   Procedure: COLONOSCOPY;  Surgeon: Manya Silvas, MD;  Location: [facility] ENDOSCOPY;  Service: Endoscopy;  Laterality: N/A;  . COLONOSCOPY  11/2016   Dr. Vira Agar  . CYSTOSCOPY  2007   with biopsy   . CYSTOSCOPY N/A 06/01/2016   Procedure: CYSTOSCOPY;  Surgeon: Malachy Mood, MD;  Location: [facility] ORS;  Service: Gynecology;  Laterality: N/A;  . DIAGNOSTIC LAPAROSCOPY    . DILATATION & CURETTAGE/HYSTEROSCOPY WITH MYOSURE N/A 04/13/2015    Procedure: DILATATION & CURETTAGE/HYSTEROSCOPY WITH MYOSURE/POLYPECTOMY;  Surgeon: Honor Loh Ward, MD;  Location: [facility] ORS;  Service: Gynecology;  Laterality: N/A;  . DILATATION & CURETTAGE/HYSTEROSCOPY WITH MYOSURE N/A 06/01/2016   Procedure: DILATATION & CURETTAGE/HYSTEROSCOPY WITH MYOSURE;  Surgeon: Malachy Mood, MD;  Location: [facility] ORS;  Service: Gynecology;  Laterality: N/A;  . GUM SURGERY    . laproscopy  2007  . POLYPECTOMY     endometrial    Home Medications:  Allergies as of 12/24/2018      Reactions   Lac Bovis Other (See Comments)   Milk-related Compounds    Omeprazole Other (See Comments)   Pt reports "Chest pain, HA and Dysuria."   Penicillins Itching, Rash      Medication List       Accurate as of December 24, 2018  3:56 PM. If you have any questions, ask your nurse or doctor.        CALCIUM 1200 PO Take 2-3 tablets by mouth daily. Reported on 05/26/2015   dicyclomine 10 MG capsule Commonly known as: BENTYL Take by mouth.   docusate sodium 100 MG capsule Commonly known as: COLACE Take 1 capsule by mouth QID.   escitalopram 10 MG tablet Commonly known as: Lexapro Take 1 tablet (10 mg total) by mouth daily.   ferrous sulfate 325 (  65 FE) MG tablet Take 325 mg by mouth daily.   levonorgestrel-ethinyl estradiol 0.15-0.03 MG tablet Commonly known as: Jolessa Take 1 tablet by mouth daily.   linaclotide 72 MCG capsule Commonly known as: LINZESS Take by mouth.   metoCLOPramide 5 MG tablet Commonly known as: Reglan Take 1 tablet (5 mg total) by mouth every 8 (eight) hours as needed for up to 2 days for nausea or vomiting.   sulfamethoxazole-trimethoprim 800-160 MG tablet Commonly known as: BACTRIM DS Take 1 tablet by mouth every 12 (twelve) hours. Started by: Michiel Cowboy, PA-C   SUMAtriptan 100 MG tablet Commonly known as: IMITREX Take 100 mg by mouth every 2 (two) hours as needed. May repeat in 2 hours if headache persists or recurs.    tamsulosin 0.4 MG Caps capsule Commonly known as: FLOMAX Take 1 capsule (0.4 mg total) by mouth daily.   vitamin B-12 1000 MCG tablet Commonly known as: CYANOCOBALAMIN Take 1,000 mcg by mouth daily.       Allergies:  Allergies  Allergen Reactions  . Lac Bovis Other (See Comments)  . Milk-Related Compounds   . Omeprazole Other (See Comments)    Pt reports "Chest pain, HA and Dysuria."  . Penicillins Itching and Rash    Family History: Family History  Problem Relation Age of Onset  . Colon cancer Father 5  . Arthritis Father   . Hyperlipidemia Father   . Transient ischemic attack Father   . Cancer - Colon Father 44  . Prostate cancer Father 72  . Valvular heart disease Father   . Arthritis Mother   . Heart disease Mother   . Stroke Mother        TIA  . Hypertension Mother   . Diabetes Mother   . Kidney cancer Mother 62  . Heart failure Mother   . Hyperlipidemia Mother   . Cancer Maternal Grandmother   . Cancer Paternal Grandfather        prostate  . Breast cancer Cousin 76    Social History:  reports that she has never smoked. She has never used smokeless tobacco. She reports that she does not drink alcohol or use drugs.  ROS: UROLOGY Frequent Urination?: Yes Hard to postpone urination?: No Burning/pain with urination?: No Get up at night to urinate?: No Leakage of urine?: No Urine stream starts and stops?: No Trouble starting stream?: No Do you have to strain to urinate?: No Blood in urine?: No Urinary tract infection?: No Sexually transmitted disease?: No Injury to kidneys or bladder?: No Painful intercourse?: No Weak stream?: No Currently pregnant?: No Vaginal bleeding?: No Last menstrual period?: n  Gastrointestinal Nausea?: No Vomiting?: No Indigestion/heartburn?: No Diarrhea?: No Constipation?: Yes  Constitutional Fever: No Night sweats?: No Weight loss?: No Fatigue?: No  Skin Skin rash/lesions?: No Itching?: No  Eyes Blurred  vision?: No Double vision?: No  Ears/Nose/Throat Sore throat?: No Sinus problems?: No  Hematologic/Lymphatic Swollen glands?: No Easy bruising?: No  Cardiovascular Leg swelling?: No Chest pain?: No  Respiratory Cough?: No Shortness of breath?: No  Endocrine Excessive thirst?: No  Musculoskeletal Back pain?: No Joint pain?: No  Neurological Headaches?: No Dizziness?: No  Psychologic Depression?: No Anxiety?: Yes  Physical Exam: BP (!) 146/88 (BP Location: Left Arm, Patient Position: Sitting, Cuff Size: Normal)   Pulse 72   Ht 5\' 10"  (1.778 m)   Wt 150 lb (68 kg)   BMI 21.52 kg/m   Constitutional:  Well nourished. Alert and oriented, No acute distress. HEENT:  Black AT, moist mucus membranes.  Trachea midline, no masses. Cardiovascular: No clubbing, cyanosis, or edema. Respiratory: Normal respiratory effort, no increased work of breathing. Neurologic: Grossly intact, no focal deficits, moving all 4 extremities. Psychiatric: Normal mood and affect.  Laboratory Data: Lab Results  Component Value Date   WBC 6.2 08/11/2018   HGB 13.1 08/11/2018   HCT 40.2 08/11/2018   MCV 92.4 08/11/2018   PLT 216 08/11/2018    Lab Results  Component Value Date   CREATININE 0.79 08/11/2018    No results found for: PSA  No results found for: TESTOSTERONE  Lab Results  Component Value Date   HGBA1C 5.4 06/03/2018    Lab Results  Component Value Date   TSH 1.46 06/03/2018       Component Value Date/Time   CHOL 178 11/29/2016 0753   HDL 50.60 11/29/2016 0753   CHOLHDL 4 11/29/2016 0753   VLDL 14.0 11/29/2016 0753   LDLCALC 113 (H) 11/29/2016 0753    Lab Results  Component Value Date   AST 15 08/11/2018   Lab Results  Component Value Date   ALT 12 08/11/2018   No components found for: ALKALINEPHOPHATASE No components found for: BILIRUBINTOTAL  No results found for: ESTRADIOL  Urinalysis Component     Latest Ref Rng & Units 12/24/2018  Specific  Gravity, UA     1.005 - 1.030 1.020  pH, UA     5.0 - 7.5 8.5 (H)  Color, UA     Yellow Yellow  Appearance Ur     Clear Cloudy (A)  Leukocytes,UA     Negative 2+ (A)  Protein,UA     Negative/Trace Trace (A)  Glucose, UA     Negative Negative  Ketones, UA     Negative Trace (A)  RBC, UA     Negative 1+ (A)  Bilirubin, UA     Negative Negative  Urobilinogen, Ur     0.2 - 1.0 mg/dL 0.2  Nitrite, UA     Negative Positive (A)  Microscopic Examination      See below:   Component     Latest Ref Rng & Units 12/24/2018  WBC, UA     0 - 5 /hpf >30 (A)  RBC     0 - 2 /hpf 3-10 (A)  Epithelial Cells (non renal)     0 - 10 /hpf 0-10  Renal Epithel, UA     None seen /hpf 0-10 (A)  Crystals     N/A Present (A)  Crystal Type     N/A Amorphous Sediment  Bacteria, UA     None seen/Few Few   I have reviewed the labs.   Pertinent Imaging: Results for Jeannette CorpusATE, Sherley (MRN [id number]) as of 12/24/2018 15:55  Ref. Range 12/24/2018 15:20  Scan Result Unknown 13   Assessment & Plan:    1. Recurrent UTI - Urinalysis, Complete - suspicious for infection - will send for culture - will start Septra DS empirically  2. Incomplete bladder emptying - continued timed voids  - Bladder Scan (Post Void Residual) in office   Return for pending urine culture results .  These notes generated with voice recognition software. I apologize for typographical errors.  Michiel CowboySHANNON Mendy Chou, PA-C  [facility] [address]  [address] [place], [place] [address] [phone]

## 2018-12-25 DIAGNOSIS — K5909 Other constipation: Secondary | ICD-10-CM | POA: Insufficient documentation

## 2018-12-25 LAB — URINALYSIS, COMPLETE
Bilirubin, UA: NEGATIVE
Glucose, UA: NEGATIVE
Nitrite, UA: POSITIVE — AB
Specific Gravity, UA: 1.02 (ref 1.005–1.030)
Urobilinogen, Ur: 0.2 mg/dL (ref 0.2–1.0)
pH, UA: 8.5 — ABNORMAL HIGH (ref 5.0–7.5)

## 2018-12-25 LAB — MICROSCOPIC EXAMINATION: WBC, UA: >30 /hpf — AB (ref 0–5)

## 2018-12-26 LAB — CULTURE, URINE COMPREHENSIVE

## 2018-12-27 ENCOUNTER — Telehealth: Payer: Self-pay | Admitting: Family Medicine

## 2018-12-27 ENCOUNTER — Emergency Department
Admission: EM | Admit: 2018-12-27 | Discharge: 2018-12-27 | Disposition: A | Discharge status: Home / Self Care | Payer: BC Managed Care – PPO | Attending: Emergency Medicine | Admitting: Emergency Medicine

## 2018-12-27 ENCOUNTER — Other Ambulatory Visit: Payer: Self-pay

## 2018-12-27 DIAGNOSIS — R112 Nausea with vomiting, unspecified: Secondary | ICD-10-CM | POA: Diagnosis not present

## 2018-12-27 DIAGNOSIS — R109 Unspecified abdominal pain: Secondary | ICD-10-CM

## 2018-12-27 DIAGNOSIS — J45909 Unspecified asthma, uncomplicated: Secondary | ICD-10-CM | POA: Insufficient documentation

## 2018-12-27 DIAGNOSIS — Z79899 Other long term (current) drug therapy: Secondary | ICD-10-CM | POA: Diagnosis not present

## 2018-12-27 DIAGNOSIS — R197 Diarrhea, unspecified: Secondary | ICD-10-CM | POA: Insufficient documentation

## 2018-12-27 LAB — CBC
HCT: 40.3 % (ref 36.0–46.0)
Hemoglobin: 13.3 g/dL (ref 12.0–15.0)
MCH: 29.9 pg (ref 26.0–34.0)
MCHC: 33 g/dL (ref 30.0–36.0)
MCV: 90.6 fL (ref 80.0–100.0)
Platelets: 238 10*3/uL (ref 150–400)
RBC: 4.45 MIL/uL (ref 3.87–5.11)
RDW: 13.2 % (ref 11.5–15.5)
WBC: 6.8 10*3/uL (ref 4.0–10.5)
nRBC: 0 % (ref 0.0–0.2)

## 2018-12-27 LAB — COMPREHENSIVE METABOLIC PANEL
ALT: 16 U/L (ref 0–44)
AST: 17 U/L (ref 15–41)
Albumin: 4.4 g/dL (ref 3.5–5.0)
Alkaline Phosphatase: 44 U/L (ref 38–126)
Anion gap: 8 (ref 5–15)
BUN: 9 mg/dL (ref 6–20)
CO2: 25 mmol/L (ref 22–32)
Calcium: 9.4 mg/dL (ref 8.9–10.3)
Chloride: 103 mmol/L (ref 98–111)
Creatinine, Ser: 0.78 mg/dL (ref 0.44–1.00)
GFR calc Af Amer: >60 mL/min (ref >60)
GFR calc non Af Amer: >60 mL/min (ref >60)
Glucose, Bld: 91 mg/dL (ref 70–99)
Potassium: 3.8 mmol/L (ref 3.5–5.1)
Sodium: 136 mmol/L (ref 135–145)
Total Bilirubin: 0.7 mg/dL (ref 0.3–1.2)
Total Protein: 7.9 g/dL (ref 6.5–8.1)

## 2018-12-27 LAB — URINALYSIS, COMPLETE (UACMP) WITH MICROSCOPIC
Bacteria, UA: NONE SEEN
Bilirubin Urine: NEGATIVE
Glucose, UA: NEGATIVE mg/dL
Ketones, ur: NEGATIVE mg/dL
Leukocytes,Ua: NEGATIVE
Nitrite: NEGATIVE
Protein, ur: NEGATIVE mg/dL
Specific Gravity, Urine: 1.006 (ref 1.005–1.030)
pH: 7 (ref 5.0–8.0)

## 2018-12-27 LAB — LIPASE, BLOOD: Lipase: 37 U/L (ref 11–51)

## 2018-12-27 MED ORDER — SODIUM CHLORIDE 0.9 % IV BOLUS
1000.0000 mL | Freq: Once | INTRAVENOUS | Status: AC
  Administered 2018-12-27: 1000 mL via INTRAVENOUS

## 2018-12-27 MED ORDER — DICYCLOMINE HCL 10 MG PO CAPS
20.0000 mg | ORAL_CAPSULE | Freq: Once | ORAL | Status: AC
  Administered 2018-12-27: 20 mg via ORAL
  Filled 2018-12-27: qty 2

## 2018-12-27 MED ORDER — ONDANSETRON 4 MG PO TBDP: 4.0000 mg | ORAL_TABLET | Freq: Three times a day (TID) | ORAL | 0 refills | Status: DC | PRN

## 2018-12-27 MED ORDER — ONDANSETRON HCL 4 MG/2ML IJ SOLN
4.0000 mg | Freq: Once | INTRAMUSCULAR | Status: AC
  Administered 2018-12-27: 4 mg via INTRAVENOUS
  Filled 2018-12-27: qty 2

## 2018-12-27 MED ORDER — DICYCLOMINE HCL 20 MG PO TABS: 20.0000 mg | ORAL_TABLET | Freq: Three times a day (TID) | ORAL | 0 refills | Status: DC | PRN

## 2018-12-27 NOTE — Telephone Encounter (Signed)
-----   Message from Nori Riis, PA-C sent at 12/26/2018  4:45 PM EDT ----- Please let Ms. Golding know that her urine culture was positive for infection.  If she feels like she is feeling better, she needs to finish the antibiotic.  If she is not seeing improvement, we need to switch to Kelfex 500 mg, BID x 7.

## 2018-12-27 NOTE — ED Provider Notes (Signed)
[facility] Emergency Department Provider Note  Time seen: 9:49 AM  I have reviewed the triage vital signs and the nursing notes.   HISTORY  Chief Complaint Abdominal Pain and Nausea   HPI Cristina Wade is a 48 y.o. female with a past medical history of chronic pelvic pain, asthma, gastric reflux, migraines, frequent diarrhea and constipation per patient, presents to the emergency department for abdominal pain nausea vomiting diarrhea.  According to the patient last night she developed mid abdominal pain which worsened throughout the night, developed 2 episodes of vomiting overnight and had 2 episodes of diarrhea this morning.  Patient states upon arrival to the emergency department she is actually feeling much better denies any abdominal pain whatsoever.  Still states mild nausea.  Patient states a history of GI issues in the past, follows up with Dr. Letitia Libra of GI medicine.  Denies any fever cough congestion or shortness of breath.  Denies dysuria.   Past Medical History:  Diagnosis Date  . Asthma   . Chronic pelvic pain in female 2018  . Cystitis   . Cystocele   . Endometrial polyp   . Endometriosis    per pt report but never seen during surg  . Frequent headaches   . Gastritis   . GERD (gastroesophageal reflux disease)    rare  . Gross hematuria   . History of kidney stones   . Microscopic hematuria   . Migraine   . Migraines   . Osteopenia   . Urinary disorder   . UTI (lower urinary tract infection)     Patient Active Problem List   Diagnosis Date Noted  . Urinary retention 08/12/2018  . Anxiety 08/12/2018  . Elevated blood pressure reading without diagnosis of hypertension 07/12/2018  . Left leg pain 07/12/2018  . Acute left-sided low back pain 06/03/2018  . Cloudy urine 04/29/2018  . Chest pain 04/29/2018  . Frontal headache 04/27/2018  . Localized, primary osteoarthritis 02/07/2018  . Pelvic pain 01/30/2018  . Slow transit constipation  10/10/2017  . Bilateral impacted cerumen 11/28/2016  . Chronic bilateral low back pain without sciatica 11/28/2016  . Precordial pain 07/07/2016  . Right bundle branch block (RBBB) on electrocardiogram (ECG) 07/07/2016  . Chest tightness 06/27/2016  . Preventative health care 09/30/2015  . Encounter to establish care 09/28/2014  . Vestibular migraine 09/28/2014  . Encounter for screening colonoscopy 11/21/2013    Past Surgical History:  Procedure Laterality Date  . COLONOSCOPY N/A 11/05/2014   Procedure: COLONOSCOPY;  Surgeon: Scot Jun, MD;  Location: [facility] ENDOSCOPY;  Service: Endoscopy;  Laterality: N/A;  . COLONOSCOPY  11/2016   Dr. Mechele Collin  . CYSTOSCOPY  2007   with biopsy   . CYSTOSCOPY N/A 06/01/2016   Procedure: CYSTOSCOPY;  Surgeon: Vena Austria, MD;  Location: [facility] ORS;  Service: Gynecology;  Laterality: N/A;  . DIAGNOSTIC LAPAROSCOPY    . DILATATION & CURETTAGE/HYSTEROSCOPY WITH MYOSURE N/A 04/13/2015   Procedure: DILATATION & CURETTAGE/HYSTEROSCOPY WITH MYOSURE/POLYPECTOMY;  Surgeon: Elenora Fender Ward, MD;  Location: [facility] ORS;  Service: Gynecology;  Laterality: N/A;  . DILATATION & CURETTAGE/HYSTEROSCOPY WITH MYOSURE N/A 06/01/2016   Procedure: DILATATION & CURETTAGE/HYSTEROSCOPY WITH MYOSURE;  Surgeon: Vena Austria, MD;  Location: [facility] ORS;  Service: Gynecology;  Laterality: N/A;  . GUM SURGERY    . laproscopy  2007  . POLYPECTOMY     endometrial    Prior to Admission medications   Medication Sig Start Date End Date Taking? Authorizing Provider  Calcium  Carbonate-Vit D-Min (CALCIUM 1200 PO) Take 2-3 tablets by mouth daily. Reported on 05/26/2015    [provider]  dicyclomine (BENTYL) 10 MG capsule Take by mouth. 10/29/18 10/29/19  [provider]  docusate sodium (COLACE) 100 MG capsule Take 1 capsule by mouth QID.     [provider]  escitalopram (LEXAPRO) 10 MG tablet Take 1 tablet (10 mg total) by mouth daily. 09/30/18   Allegra GranaArnett,  Margaret G, FNP  ferrous sulfate 325 (65 FE) MG tablet Take 325 mg by mouth daily.    [provider]  levonorgestrel-ethinyl estradiol (JOLESSA) 0.15-0.03 MG tablet Take 1 tablet by mouth daily. 03/13/18   Copland, Ilona SorrelAlicia B, PA-C  linaclotide (LINZESS) 72 MCG capsule Take by mouth. 12/06/18   [provider]  metoCLOPramide (REGLAN) 5 MG tablet Take 1 tablet (5 mg total) by mouth every 8 (eight) hours as needed for up to 2 days for nausea or vomiting. 08/14/18 08/16/18  Menshew, Charlesetta IvoryJenise V Bacon, PA-C  sulfamethoxazole-trimethoprim (BACTRIM DS) 800-160 MG tablet Take 1 tablet by mouth every 12 (twelve) hours. 12/24/18   Michiel CowboyMcGowan, Shannon A, PA-C  SUMAtriptan (IMITREX) 100 MG tablet Take 100 mg by mouth every 2 (two) hours as needed. May repeat in 2 hours if headache persists or recurs.    [provider]  tamsulosin (FLOMAX) 0.4 MG CAPS capsule Take 1 capsule (0.4 mg total) by mouth daily. 10/17/18   Michiel CowboyMcGowan, Shannon A, PA-C  vitamin B-12 (CYANOCOBALAMIN) 1000 MCG tablet Take 1,000 mcg by mouth daily.    [provider]    Allergies  Allergen Reactions  . Lac Bovis Other (See Comments)  . Milk-Related Compounds   . Omeprazole Other (See Comments)    Pt reports "Chest pain, HA and Dysuria."  . Penicillins Itching and Rash    Family History  Problem Relation Age of Onset  . Colon cancer Father 2350  . Arthritis Father   . Hyperlipidemia Father   . Transient ischemic attack Father   . Cancer - Colon Father [id number]  . Prostate cancer Father 5768  . Valvular heart disease Father   . Arthritis Mother   . Heart disease Mother   . Stroke Mother        TIA  . Hypertension Mother   . Diabetes Mother   . Kidney cancer Mother [id number]  . Heart failure Mother   . Hyperlipidemia Mother   . Cancer Maternal Grandmother   . Cancer Paternal Grandfather        prostate  . Breast cancer Cousin [id number]    Social History Social History   Tobacco Use  . Smoking status: Never Smoker  .  Smokeless tobacco: Never Used  Substance Use Topics  . Alcohol use: No  . Drug use: No    Review of Systems Constitutional: Negative for fever. Cardiovascular: Negative for chest pain. Respiratory: Negative for shortness of breath. Gastrointestinal: Positive for abdominal pain which is since resolved.  Positive for nausea vomiting diarrhea which have largely resolved. Genitourinary: Negative for urinary compaints Musculoskeletal: Negative for musculoskeletal complaints Skin: Negative for skin complaints  Neurological: Negative for headache All other ROS negative  ____________________________________________   PHYSICAL EXAM:  VITAL SIGNS: ED Triage Vitals [12/27/18 0924]  Enc Vitals Group     BP (!) 147/64     Pulse Rate 73     Resp 18     Temp 98.7 F (37.1 C)     Temp Source Oral     SpO2 100 %  Weight 149 lb 14.6 oz (68 kg)     Height 5\' 10"  (1.778 m)     Head Circumference      Peak Flow      Pain Score 8     Pain Loc      Pain Edu?      Excl. in [place]?    Constitutional: Alert and oriented. Well appearing and in no distress. Eyes: Normal exam ENT      Head: Normocephalic and atraumatic.      Mouth/Throat: Mucous membranes are moist. Cardiovascular: Normal rate, regular rhythm.  Respiratory: Normal respiratory effort without tachypnea nor retractions. Breath sounds are clear Gastrointestinal: Soft and nontender. No distention.   Musculoskeletal: Nontender with normal range of motion in all extremities.  Neurologic:  Normal speech and language. No gross focal neurologic deficits  Skin:  Skin is warm, dry and intact.  Psychiatric: Mood and affect are normal.   ____________________________________________   INITIAL IMPRESSION / ASSESSMENT AND PLAN / ED COURSE  Pertinent labs & imaging results that were available during my care of the patient were reviewed by me and considered in my medical decision making (see chart for details).   Patient presents  emergency department for abdominal discomfort nausea vomiting diarrhea.  Patient states she is actually feeling much better most of her symptoms have since resolved.  Patient has completely benign abdominal exam.  Differential would include dehydration, electrolyte or metabolic abnormality, gastritis, gastroenteritis, enteritis, intra-abdominal pathology.  We will check labs, treat with IV fluids and nausea medication and continue to closely monitor.  Patient agreeable to plan of care.  Given her completely benign abdominal exam we will hold off on CT imaging at this time and reassess once lab work has resulted.  Patient's work-up is largely nonrevealing.  Lab work is normal.  Urinalysis is negative.  Patient is feeling well.  We will discharge patient home with a short course of Bentyl and Zofran.  Patient will follow-up with her GI doctor.  Discussed my normal abdominal pain return precautions.  Arvie Villarruel was evaluated in Emergency Department on 12/27/2018 for the symptoms described in the history of present illness. She was evaluated in the context of the global COVID-19 pandemic, which necessitated consideration that the patient might be at risk for infection with the SARS-CoV-2 virus that causes COVID-19. Institutional protocols and algorithms that pertain to the evaluation of patients at risk for COVID-19 are in a state of rapid change based on information released by regulatory bodies including the CDC and federal and state organizations. These policies and algorithms were followed during the patient's care in the ED.  ____________________________________________   FINAL CLINICAL IMPRESSION(S) / ED DIAGNOSES  Abdominal pain Nausea vomiting diarrhea   Harvest Dark, MD 12/27/18 1207

## 2018-12-27 NOTE — ED Triage Notes (Signed)
Abdominal pain, nausea, constipation, headache X a few days. Hx of multiple abdominal complaints for months. Pt alert and oriented X4, cooperative, RR even and unlabored, color WNL. Pt in NAD.

## 2018-12-27 NOTE — ED Notes (Signed)
Pt unhooked self from cardiac monitor and ambulated to toilet. This RN checked on pt while pt was sitting on toilet and pt denied needing anything. Family member at bedside.

## 2018-12-27 NOTE — ED Notes (Signed)
EDP at bedside  

## 2018-12-27 NOTE — ED Notes (Signed)
Vicente Males RN attempted to DC pt. Pt refusing DC at this time. Vicente Males RN answered pt questions but pt remains refusing DC. Pt demanding food. Vicente Males RN provided pt with crackers. Informed EDP that pt wants to talk to EDP again to have a doctor answer her questions.

## 2018-12-27 NOTE — Telephone Encounter (Signed)
Patient notified and is feeling better on the ABX so she will finish it.

## 2018-12-28 ENCOUNTER — Emergency Department
Admission: EM | Admit: 2018-12-28 | Discharge: 2018-12-29 | Disposition: A | Discharge status: Home / Self Care | Payer: BC Managed Care – PPO | Attending: Emergency Medicine | Admitting: Emergency Medicine

## 2018-12-28 ENCOUNTER — Encounter: Payer: Self-pay | Admitting: Intensive Care

## 2018-12-28 ENCOUNTER — Other Ambulatory Visit: Payer: Self-pay

## 2018-12-28 DIAGNOSIS — R112 Nausea with vomiting, unspecified: Secondary | ICD-10-CM | POA: Diagnosis not present

## 2018-12-28 DIAGNOSIS — G8929 Other chronic pain: Secondary | ICD-10-CM | POA: Diagnosis not present

## 2018-12-28 DIAGNOSIS — K5900 Constipation, unspecified: Secondary | ICD-10-CM | POA: Insufficient documentation

## 2018-12-28 DIAGNOSIS — R109 Unspecified abdominal pain: Secondary | ICD-10-CM | POA: Insufficient documentation

## 2018-12-28 DIAGNOSIS — R1084 Generalized abdominal pain: Secondary | ICD-10-CM | POA: Diagnosis present

## 2018-12-28 DIAGNOSIS — R102 Pelvic and perineal pain: Secondary | ICD-10-CM | POA: Insufficient documentation

## 2018-12-28 DIAGNOSIS — Z79899 Other long term (current) drug therapy: Secondary | ICD-10-CM | POA: Diagnosis not present

## 2018-12-28 LAB — CBC
HCT: 41 % (ref 36.0–46.0)
Hemoglobin: 13.5 g/dL (ref 12.0–15.0)
MCH: 29.7 pg (ref 26.0–34.0)
MCHC: 32.9 g/dL (ref 30.0–36.0)
MCV: 90.1 fL (ref 80.0–100.0)
Platelets: 247 10*3/uL (ref 150–400)
RBC: 4.55 MIL/uL (ref 3.87–5.11)
RDW: 13.1 % (ref 11.5–15.5)
WBC: 6 10*3/uL (ref 4.0–10.5)
nRBC: 0 % (ref 0.0–0.2)

## 2018-12-28 LAB — COMPREHENSIVE METABOLIC PANEL
ALT: 14 U/L (ref 0–44)
AST: 14 U/L — ABNORMAL LOW (ref 15–41)
Albumin: 3.9 g/dL (ref 3.5–5.0)
Alkaline Phosphatase: 40 U/L (ref 38–126)
Anion gap: 10 (ref 5–15)
BUN: 7 mg/dL (ref 6–20)
CO2: 23 mmol/L (ref 22–32)
Calcium: 9.5 mg/dL (ref 8.9–10.3)
Chloride: 105 mmol/L (ref 98–111)
Creatinine, Ser: 0.77 mg/dL (ref 0.44–1.00)
GFR calc Af Amer: >60 mL/min (ref >60)
GFR calc non Af Amer: >60 mL/min (ref >60)
Glucose, Bld: 93 mg/dL (ref 70–99)
Potassium: 4.6 mmol/L (ref 3.5–5.1)
Sodium: 138 mmol/L (ref 135–145)
Total Bilirubin: 0.7 mg/dL (ref 0.3–1.2)
Total Protein: 7.3 g/dL (ref 6.5–8.1)

## 2018-12-28 LAB — URINALYSIS, COMPLETE (UACMP) WITH MICROSCOPIC
Bacteria, UA: NONE SEEN
Bilirubin Urine: NEGATIVE
Glucose, UA: NEGATIVE mg/dL
Ketones, ur: NEGATIVE mg/dL
Leukocytes,Ua: NEGATIVE
Nitrite: NEGATIVE
Protein, ur: NEGATIVE mg/dL
Specific Gravity, Urine: 1.004 — ABNORMAL LOW (ref 1.005–1.030)
pH: 8 (ref 5.0–8.0)

## 2018-12-28 LAB — LIPASE, BLOOD: Lipase: 37 U/L (ref 11–51)

## 2018-12-28 NOTE — ED Triage Notes (Signed)
Patient c/o abd pain with N/V that has worsened since yesterday. Was seen at [facility] yesterday, discharged home, and told to come back today if not any better for a scan.

## 2018-12-29 ENCOUNTER — Emergency Department: Payer: BC Managed Care – PPO

## 2018-12-29 NOTE — ED Provider Notes (Signed)
[facility] Emergency Department Provider Note  ____________________________________________   First MD Initiated Contact with Patient 12/28/18 2356     (approximate)  I have reviewed the triage vital signs and the nursing notes.   HISTORY  Chief Complaint Abdominal Pain, Emesis, and Nausea    HPI Cristina Wade is a 48 y.o. female with chronic abdominal pain and chronic pelvic pain, frequent headaches, history of kidney stones, etc. as listed below.  She presents tonight for further evaluation of nausea, vomiting, and abdominal pain.  She was seen yesterday and evaluated  in this emergency department had a reassuring work-up and a completely benign abdominal exam.  The provider discussed with the patient the usual return precautions but declined imaging at that time given her reassuring work-up and history of chronic abdominal pain and the fact that she was feeling better after some nonnarcotic medication.  She presents tonight because she said the symptoms have persisted and she was told she should come back if they are not getting better or if they start getting worse.  She describes generalized abdominal pain that is an aching sensation, with occasional episodes of nausea and vomiting.  She also reports constipation for an extended period of time and says this is a chronic issue for her and she thinks that is what is going on with her tonight.  She has taken some senna but it has not helped.  She reports that the constipation is severe and has been an issue for her in the past.  She denies fever/chills, sore throat, chest pain, shortness of breath, cough, and dysuria.  Nothing in particular makes the symptoms better or worse.        Past Medical History:  Diagnosis Date  . Asthma   . Chronic pelvic pain in female 2018  . Cystitis   . Cystocele   . Endometrial polyp   . Endometriosis    per pt report but never seen during surg  . Frequent headaches   .  Gastritis   . GERD (gastroesophageal reflux disease)    rare  . Gross hematuria   . History of kidney stones   . Microscopic hematuria   . Migraine   . Migraines   . Osteopenia   . Urinary disorder   . UTI (lower urinary tract infection)     Patient Active Problem List   Diagnosis Date Noted  . Urinary retention 08/12/2018  . Anxiety 08/12/2018  . Elevated blood pressure reading without diagnosis of hypertension 07/12/2018  . Left leg pain 07/12/2018  . Acute left-sided low back pain 06/03/2018  . Cloudy urine 04/29/2018  . Chest pain 04/29/2018  . Frontal headache 04/27/2018  . Localized, primary osteoarthritis 02/07/2018  . Pelvic pain 01/30/2018  . Slow transit constipation 10/10/2017  . Bilateral impacted cerumen 11/28/2016  . Chronic bilateral low back pain without sciatica 11/28/2016  . Precordial pain 07/07/2016  . Right bundle branch block (RBBB) on electrocardiogram (ECG) 07/07/2016  . Chest tightness 06/27/2016  . Preventative health care 09/30/2015  . Encounter to establish care 09/28/2014  . Vestibular migraine 09/28/2014  . Encounter for screening colonoscopy 11/21/2013    Past Surgical History:  Procedure Laterality Date  . COLONOSCOPY N/A 11/05/2014   Procedure: COLONOSCOPY;  Surgeon: Scot Jun, MD;  Location: [facility] ENDOSCOPY;  Service: Endoscopy;  Laterality: N/A;  . COLONOSCOPY  11/2016   Dr. Mechele Collin  . CYSTOSCOPY  2007   with biopsy   . CYSTOSCOPY N/A 06/01/2016  Procedure: CYSTOSCOPY;  Surgeon: Vena Austria, MD;  Location: [facility] ORS;  Service: Gynecology;  Laterality: N/A;  . DIAGNOSTIC LAPAROSCOPY    . DILATATION & CURETTAGE/HYSTEROSCOPY WITH MYOSURE N/A 04/13/2015   Procedure: DILATATION & CURETTAGE/HYSTEROSCOPY WITH MYOSURE/POLYPECTOMY;  Surgeon: Elenora Fender Ward, MD;  Location: [facility] ORS;  Service: Gynecology;  Laterality: N/A;  . DILATATION & CURETTAGE/HYSTEROSCOPY WITH MYOSURE N/A 06/01/2016   Procedure: DILATATION &  CURETTAGE/HYSTEROSCOPY WITH MYOSURE;  Surgeon: Vena Austria, MD;  Location: [facility] ORS;  Service: Gynecology;  Laterality: N/A;  . GUM SURGERY    . laproscopy  2007  . POLYPECTOMY     endometrial    Prior to Admission medications   Medication Sig Start Date End Date Taking? Authorizing Provider  Calcium Carbonate-Vit D-Min (CALCIUM 1200 PO) Take 2-3 tablets by mouth daily. Reported on 05/26/2015    [provider]  dicyclomine (BENTYL) 20 MG tablet Take 1 tablet (20 mg total) by mouth 3 (three) times daily as needed for spasms. 12/27/18 12/27/19  Minna Antis, MD  docusate sodium (COLACE) 100 MG capsule Take 1 capsule by mouth QID.     [provider]  escitalopram (LEXAPRO) 10 MG tablet Take 1 tablet (10 mg total) by mouth daily. 09/30/18   [name], FNP  ferrous sulfate 325 (65 FE) MG tablet Take 325 mg by mouth daily.    [provider]  levonorgestrel-ethinyl estradiol (JOLESSA) 0.15-0.03 MG tablet Take 1 tablet by mouth daily. 03/13/18   Copland, Ilona Sorrel, PA-C  linaclotide (LINZESS) 72 MCG capsule Take by mouth. 12/06/18   [provider]  metoCLOPramide (REGLAN) 5 MG tablet Take 1 tablet (5 mg total) by mouth every 8 (eight) hours as needed for up to 2 days for nausea or vomiting. 08/14/18 08/16/18  Menshew, Charlesetta Ivory, PA-C  ondansetron (ZOFRAN ODT) 4 MG disintegrating tablet Take 1 tablet (4 mg total) by mouth every 8 (eight) hours as needed for nausea or vomiting. 12/27/18   Minna Antis, MD  sulfamethoxazole-trimethoprim (BACTRIM DS) 800-160 MG tablet Take 1 tablet by mouth every 12 (twelve) hours. 12/24/18   Michiel Cowboy A, PA-C  SUMAtriptan (IMITREX) 100 MG tablet Take 100 mg by mouth every 2 (two) hours as needed. May repeat in 2 hours if headache persists or recurs.    [provider]  tamsulosin (FLOMAX) 0.4 MG CAPS capsule Take 1 capsule (0.4 mg total) by mouth daily. 10/17/18   Michiel Cowboy A, PA-C  vitamin B-12  (CYANOCOBALAMIN) 1000 MCG tablet Take 1,000 mcg by mouth daily.    [provider]    Allergies Lac bovis, Milk-related compounds, Omeprazole, and Penicillins  Family History  Problem Relation Age of Onset  . Colon cancer Father 66  . Arthritis Father   . Hyperlipidemia Father   . Transient ischemic attack Father   . Cancer - Colon Father 1  . Prostate cancer Father 1  . Valvular heart disease Father   . Arthritis Mother   . Heart disease Mother   . Stroke Mother        TIA  . Hypertension Mother   . Diabetes Mother   . Kidney cancer Mother 61  . Heart failure Mother   . Hyperlipidemia Mother   . Cancer Maternal Grandmother   . Cancer Paternal Grandfather        prostate  . Breast cancer Cousin 75    Social History Social History   Tobacco Use  . Smoking status: Never Smoker  . Smokeless tobacco:  Never Used  Substance Use Topics  . Alcohol use: No  . Drug use: No    Review of Systems Constitutional: No fever/chills Eyes: No visual changes. ENT: No sore throat. Cardiovascular: Denies chest pain. Respiratory: Denies shortness of breath. Gastrointestinal: Generalized aching abdominal pain with nausea and vomiting as well as constipation. Genitourinary: Negative for dysuria. Musculoskeletal: Negative for neck pain.  Negative for back pain. Integumentary: Negative for rash. Neurological: Negative for headaches, focal weakness or numbness.   ____________________________________________   PHYSICAL EXAM:  VITAL SIGNS: ED Triage Vitals  Enc Vitals Group     BP 12/28/18 1718 137/73     Pulse Rate 12/28/18 1718 63     Resp 12/28/18 1718 14     Temp 12/28/18 1718 98.2 F (36.8 C)     Temp Source 12/28/18 1718 Oral     SpO2 12/28/18 1718 98 %     Weight 12/28/18 1719 68 kg (150 lb)     Height 12/28/18 1719 1.778 m (5\' 10" )     Head Circumference --      Peak Flow --      Pain Score 12/28/18 1719 8     Pain Loc --      Pain Edu? --      Excl.  in GC? --     Constitutional: Alert and oriented.  No acute distress, sitting up, not vomiting, no indication of pain nor discomfort. Eyes: Conjunctivae are normal.  Head: Atraumatic. Nose: No congestion/rhinnorhea. Mouth/Throat: Mucous membranes are moist. Neck: No stridor.  No meningeal signs.   Cardiovascular: Normal rate, regular rhythm. Good peripheral circulation. Grossly normal heart sounds. Respiratory: Normal respiratory effort.  No retractions. Gastrointestinal: Soft and nondistended.  She has a completely benign abdominal exam with no tenderness to palpation in any quadrant. Musculoskeletal: No lower extremity tenderness nor edema. No gross deformities of extremities. Neurologic:  Normal speech and language. No gross focal neurologic deficits are appreciated.  Skin:  Skin is warm, dry and intact. Psychiatric: Mood and affect are normal. Speech and behavior are normal.  ____________________________________________   LABS (all labs ordered are listed, but only abnormal results are displayed)  Labs Reviewed  COMPREHENSIVE METABOLIC PANEL - Abnormal; Notable for the following components:      Result Value   AST 14 (*)    All other components within normal limits  URINALYSIS, COMPLETE (UACMP) WITH MICROSCOPIC - Abnormal; Notable for the following components:   Color, Urine STRAW (*)    APPearance CLEAR (*)    Specific Gravity, Urine 1.004 (*)    Hgb urine dipstick SMALL (*)    All other components within normal limits  LIPASE, BLOOD  CBC   ____________________________________________  EKG  No indication for EKG ____________________________________________  RADIOLOGY I, Loleta Roseory Nolah Krenzer, personally viewed and evaluated these images (plain radiographs) as part of my medical decision making, as well as reviewing the written report by the radiologist.  ED MD interpretation:  No acute abnormalities  Official radiology report(s): Dg Abdomen Acute W/chest  Result Date:  12/29/2018 CLINICAL DATA:  Generalized abdominal pain. Nausea vomiting. Suspected constipation. EXAM: DG ABDOMEN ACUTE W/ 1V CHEST COMPARISON:  Abdominal radiograph 10/17/2018. CT 08/11/2018 FINDINGS: The cardiomediastinal contours are normal. The lungs are clear. There is no free intra-abdominal air. No dilated bowel loops to suggest obstruction. Small volume of stool throughout the colon. Tiny renal calculi on CT are not visualized radiographically. Pelvic phleboliths are unchanged. No acute osseous abnormalities are seen. IMPRESSION: Negative abdominal radiographs. No  acute cardiopulmonary disease. Small volume of colonic stool. Electronically Signed   By: Narda RutherfordMelanie  Sanford M.D.   On: 12/29/2018 00:43    ____________________________________________   PROCEDURES   Procedure(s) performed (including Critical Care):  Procedures   ____________________________________________   INITIAL IMPRESSION / MDM / ASSESSMENT AND PLAN / ED COURSE  As part of my medical decision making, I reviewed the following data within the electronic MEDICAL RECORD NUMBER Nursing notes reviewed and incorporated, Labs reviewed , EKG interpreted , Old chart reviewed, Radiograph reviewed , Notes from prior ED visits and [place] Controlled Substance Database   Differential diagnosis includes, but is not limited to, constipation, acute on chronic abdominal pain, gastroparesis, SBO/ileus.  The patient has been waiting for a room for 7 hours at the point that I saw her and she is in no distress.  She says that she is hungry and would like to try something to eat and drink.  Her vital signs are stable and reassuring.  She has not had any vomiting since coming to the emergency department.  Lab work is once again stable and consistent with yesterday with a normal comprehensive metabolic panel, lipase, and CBC.  No indication of infection on urinalysis, no ketones in the urine.  No hypokalemia.  I had an extended conversation with the  patient about her symptoms and the patient is the first 1 to say that she thinks this is constipation.  We discussed the difference between having some symptoms due to constipation versus having an actual small bowel obstruction and she seems to understand a little bit better.  I offered multiple times to get a CT scan but I also offered to her as an alternative that we can get acute abdomen series of x-rays and if these are reassuring and she is able to eat and drink as she wants to, this should be an appropriate rule out.  She agrees with the plan.  She is most concerned at this point to get something to eat and drink.  Assuming the films are reassuring and she does not vomit on her p.o. challenge, she should be able to be discharged and follow-up as an outpatient with her GI doctor and primary care provider.      Clinical Course as of Dec 29 530  Wynelle LinkSun Dec 29, 2018  0049 No dilated loops of bowel or air-fluid levels.  Some degree of colonic stool burden including on the right side of her colon.  Patient continues to be in no distress.  Tolerating p.o. challenge.  I will discharge for outpatient follow-up with my usual customary return precautions.  DG Abdomen Acute W/Chest [CF]    Clinical Course User Index [CF] Loleta RoseForbach, Turquoise Esch, MD     ____________________________________________  FINAL CLINICAL IMPRESSION(S) / ED DIAGNOSES  Final diagnoses:  Constipation, unspecified constipation type  Chronic abdominal pain     MEDICATIONS GIVEN DURING THIS VISIT:  Medications - No data to display   ED Discharge Orders    None      *Please note:  Jeannette CorpusSharon Lindvall was evaluated in Emergency Department on 12/29/2018 for the symptoms described in the history of present illness. She was evaluated in the context of the global COVID-19 pandemic, which necessitated consideration that the patient might be at risk for infection with the SARS-CoV-2 virus that causes COVID-19. Institutional protocols and  algorithms that pertain to the evaluation of patients at risk for COVID-19 are in a state of rapid change based on information released by regulatory bodies  including the CDC and federal and state organizations. These policies and algorithms were followed during the patient's care in the ED.  Some ED evaluations and interventions may be delayed as a result of limited staffing during the pandemic.*  Note:  This document was prepared using Dragon voice recognition software and may include unintentional dictation errors.   Hinda Kehr, MD 12/29/18 [phone]

## 2018-12-29 NOTE — Discharge Instructions (Signed)
You were seen in the emergency department today for abdominal pain, nausea, and vomiting, that we believe are due to constipation.  We recommend that you use one or more of the following over-the-counter medications in the order described:   1)  Colace (or Dulcolax) 100 mg:  This is a stool softener, and you may take it once or twice a day as needed. 2)  Senna tablets:  This is a bowel stimulant that will help "push" out your stool. It is the next step to add after you have tried a stool softener. 3)  Miralax (powder):  This medication works by drawing additional fluid into your intestines and helps to flush out your stool.  Mix the powder with water or juice according to label instructions.  It may help if the Colace and Senna are not sufficient, but you must be sure to use the recommended amount of water or juice when you mix up the powder. 4)  Look for magnesium citrate at the pharmacy (it is usually a small glass bottle).  Drink the bottle according to the label instructions.  Alternatively, I gave you a copy of the Bowel Prep instructions from the [facility]astro[facility] department.  This uses over-the-counter ingredients and will definitely help you move your bowels and get your GI tract cleaned out, but anticipate spending most of the day going back and forth to the bathroom.  It is your choice if you want to use this method, and please follow the instructions as provided.  Remember that narcotic pain medications are constipating, so avoid them or minimize their use.  Drink plenty of fluids.  Please return to the Emergency Department immediately if you develop new or worsening symptoms that concern you, such as (but not limited to) fever > 101 degrees, severe abdominal pain, or persistent vomiting.

## 2018-12-31 ENCOUNTER — Telehealth: Payer: Self-pay | Admitting: Physician Assistant

## 2018-12-31 ENCOUNTER — Other Ambulatory Visit: Payer: Self-pay | Admitting: Physician Assistant

## 2018-12-31 NOTE — Telephone Encounter (Signed)
Pt wants to know if you would consider changing her RX to 2 Flomax per day.  Right now she is taking only 1 and it is helping her a lot.  Please call pt.

## 2018-12-31 NOTE — Telephone Encounter (Signed)
Just spoke with the patient. She is reporting bilateral intermittent flank pain. Renal stone study from April 2020 with bilateral nonobstructing renal stones and she is currently on Bactrim for a UTI. I advised her to start a 1-week trial of tamsulosin 0.4mg  bidaily and call back to the office to inform us of her results. She expressed understanding of this plan.

## 2019-01-01 NOTE — Telephone Encounter (Signed)
I just spoke with the patient via telephone.  She reports some bladder pain over the past several weeks, associated with her most recent UTI.  She completed her antibiotics 2 days ago and reports resolution in her symptoms.  She suspects that the bladder pain may be associated with her IC.  I explained to her that postinfectious inflammation of the bladder may take several weeks to resolve.  I advised her to stay well-hydrated, continue timed voiding, and avoid bladder irritants, including caffeine, alcohol, spicy food, and acidic food.  I explained that I expect her symptoms to improve on their own, however she should call the office back in 2 weeks if she is continuing to have problems.  She expressed understanding of this plan.

## 2019-01-01 NOTE — Telephone Encounter (Signed)
Pt called back and wants to know if something can be called in for her IC. Please advise.

## 2019-01-06 ENCOUNTER — Telehealth: Payer: Self-pay | Admitting: Physician Assistant

## 2019-01-06 NOTE — Telephone Encounter (Signed)
Pt called office to let us know she is not doing any better.  She wants to know if she needs to make another appt.

## 2019-01-06 NOTE — Telephone Encounter (Signed)
Pt called office and states she is not feeling any better and still in pain.  Should she make another appt?

## 2019-01-06 NOTE — Telephone Encounter (Signed)
Please schedule her for a clinic visit with me later this week.

## 2019-01-07 ENCOUNTER — Other Ambulatory Visit: Payer: Self-pay

## 2019-01-07 ENCOUNTER — Encounter: Payer: Self-pay | Admitting: Physician Assistant

## 2019-01-07 ENCOUNTER — Ambulatory Visit (INDEPENDENT_AMBULATORY_CARE_PROVIDER_SITE_OTHER): Payer: BC Managed Care – PPO | Admitting: Physician Assistant

## 2019-01-07 VITALS — BP 98/61 | HR 79 | Ht 70.0 in | Wt 150.8 lb

## 2019-01-07 DIAGNOSIS — R3989 Other symptoms and signs involving the genitourinary system: Secondary | ICD-10-CM

## 2019-01-07 DIAGNOSIS — R3982 Chronic bladder pain: Secondary | ICD-10-CM

## 2019-01-07 LAB — URINALYSIS, COMPLETE
Bilirubin, UA: NEGATIVE
Glucose, UA: NEGATIVE
Nitrite, UA: NEGATIVE
Protein,UA: NEGATIVE
Specific Gravity, UA: 1.02 (ref 1.005–1.030)
Urobilinogen, Ur: 0.2 mg/dL (ref 0.2–1.0)
pH, UA: 7 (ref 5.0–7.5)

## 2019-01-07 LAB — MICROSCOPIC EXAMINATION

## 2019-01-07 MED ORDER — PENTOSAN POLYSULFATE SODIUM 100 MG PO CAPS: 100.0000 mg | ORAL_CAPSULE | Freq: Three times a day (TID) | ORAL | 2 refills | Status: DC

## 2019-01-07 NOTE — Progress Notes (Signed)
01/07/2019 11:45 AM   Cristina Wade 05-27-1970 [id number]  CC: Lower abdominal pain  HPI: Cristina Wade is a 48 y.o. female who presents today for evaluation of lower abdominal pain. She is an established [facility] patient who last saw Michiel Cowboy on 12/24/2018 for acute UTI. She was found to have an infection at that time and treated with Bactrim DS BID x.  In the interim, she has sought care twice in the ED for N/V/abdominal pain, thought to be attributable to constipation. She has also initiated a trial of Flomax 0.4mg  BID for her intermittent bilateral flank pain, per my instructions. She has also requested treatment for her IC, with symptoms of bladder pain associated with her most recent UTI.  She reports an at least 2-year history of midline lower abdominal pain.  She wonders if this is associated with her bladder or her ovaries.  She reports she has previously seen GYN and been told there is nothing wrong with her ovaries.  She did have findings consistent with IC per cystoscopy in the past.  She reports complete resolution of dysuria and infective symptoms following treatment for her most recent UTI.  In-office UA today positive for 2+ blood and trace leukocyte esterase; urine microscopy pan-negative.  PMH: Past Medical History:  Diagnosis Date  . Asthma   . Chronic pelvic pain in female 2018  . Cystitis   . Cystocele   . Endometrial polyp   . Endometriosis    per pt report but never seen during surg  . Frequent headaches   . Gastritis   . GERD (gastroesophageal reflux disease)    rare  . Gross hematuria   . History of kidney stones   . Microscopic hematuria   . Migraine   . Migraines   . Osteopenia   . Urinary disorder   . UTI (lower urinary tract infection)     Surgical History: Past Surgical History:  Procedure Laterality Date  . COLONOSCOPY N/A 11/05/2014   Procedure: COLONOSCOPY;  Surgeon: Scot Jun, MD;  Location[facility] ENDOSCOPY;  Service: Endoscopy;   Laterality: N/A;  . COLONOSCOPY  11/2016   Dr. Mechele Collin  . CYSTOSCOPY  2007   with biopsy   . CYSTOSCOPY N/A 06/01/2016   Procedure: CYSTOSCOPY;  Surgeon: Vena Austria, MD;  Location: [facility] ORS;  Service: Gynecology;  Laterality: N/A;  . DIAGNOSTIC LAPAROSCOPY    . DILATATION & CURETTAGE/HYSTEROSCOPY WITH MYOSURE N/A 04/13/2015   Procedure: DILATATION & CURETTAGE/HYSTEROSCOPY WITH MYOSURE/POLYPECTOMY;  Surgeon: Elenora Fender Ward, MD;  Location: [facility] ORS;  Service: Gynecology;  Laterality: N/A;  . DILATATION & CURETTAGE/HYSTEROSCOPY WITH MYOSURE N/A 06/01/2016   Procedure: DILATATION & CURETTAGE/HYSTEROSCOPY WITH MYOSURE;  Surgeon: Vena Austria, MD;  Location: [facility] ORS;  Service: Gynecology;  Laterality: N/A;  . GUM SURGERY    . laproscopy  2007  . POLYPECTOMY     endometrial    Home Medications:  Allergies as of 01/07/2019      Reactions   Lac Bovis Other (See Comments)   Milk-related Compounds    Omeprazole Other (See Comments)   Pt reports "Chest pain, HA and Dysuria."   Penicillins Itching, Rash      Medication List       Accurate as of January 07, 2019 11:59 PM. If you have any questions, ask your nurse or doctor.        CALCIUM 1200 PO Take 2-3 tablets by mouth daily. Reported on 05/26/2015   dicyclomine 20 MG tablet Commonly known as:  Bentyl Take 1 tablet (20 mg total) by mouth 3 (three) times daily as needed for spasms.   docusate sodium 100 MG capsule Commonly known as: COLACE Take 1 capsule by mouth QID.   escitalopram 10 MG tablet Commonly known as: Lexapro Take 1 tablet (10 mg total) by mouth daily.   ferrous sulfate 325 (65 FE) MG tablet Take 325 mg by mouth daily.   levonorgestrel-ethinyl estradiol 0.15-0.03 MG tablet Commonly known as: Jolessa Take 1 tablet by mouth daily.   linaclotide 72 MCG capsule Commonly known as: LINZESS Take by mouth.   metoCLOPramide 5 MG tablet Commonly known as: Reglan Take 1 tablet (5 mg total) by mouth every 8  (eight) hours as needed for up to 2 days for nausea or vomiting.   ondansetron 4 MG disintegrating tablet Commonly known as: Zofran ODT Take 1 tablet (4 mg total) by mouth every 8 (eight) hours as needed for nausea or vomiting.   pentosan polysulfate 100 MG capsule Commonly known as: ELMIRON Take 1 capsule (100 mg total) by mouth 3 (three) times daily. Take with water at least 1 hour before meals or 2 hours after meals. Started by: Carman Ching, PA-C   sulfamethoxazole-trimethoprim 800-160 MG tablet Commonly known as: BACTRIM DS Take 1 tablet by mouth every 12 (twelve) hours.   SUMAtriptan 100 MG tablet Commonly known as: IMITREX Take 100 mg by mouth every 2 (two) hours as needed. May repeat in 2 hours if headache persists or recurs.   tamsulosin 0.4 MG Caps capsule Commonly known as: FLOMAX Take 1 capsule (0.4 mg total) by mouth daily.   vitamin B-12 1000 MCG tablet Commonly known as: CYANOCOBALAMIN Take 1,000 mcg by mouth daily.       Allergies:  Allergies  Allergen Reactions  . Lac Bovis Other (See Comments)  . Milk-Related Compounds   . Omeprazole Other (See Comments)    Pt reports "Chest pain, HA and Dysuria."  . Penicillins Itching and Rash    Family History: Family History  Problem Relation Age of Onset  . Colon cancer Father 81  . Arthritis Father   . Hyperlipidemia Father   . Transient ischemic attack Father   . Cancer - Colon Father 87  . Prostate cancer Father 17  . Valvular heart disease Father   . Arthritis Mother   . Heart disease Mother   . Stroke Mother        TIA  . Hypertension Mother   . Diabetes Mother   . Kidney cancer Mother 5  . Heart failure Mother   . Hyperlipidemia Mother   . Cancer Maternal Grandmother   . Cancer Paternal Grandfather        prostate  . Breast cancer Cousin 79    Social History:   reports that she has never smoked. She has never used smokeless tobacco. She reports that she does not drink alcohol or  use drugs.  ROS: UROLOGY Frequent Urination?: Yes Hard to postpone urination?: No Burning/pain with urination?: No Get up at night to urinate?: No Leakage of urine?: No Urine stream starts and stops?: No Trouble starting stream?: Yes Do you have to strain to urinate?: No Blood in urine?: No Urinary tract infection?: No Sexually transmitted disease?: No Injury to kidneys or bladder?: No Painful intercourse?: No Weak stream?: No Currently pregnant?: No Vaginal bleeding?: No Last menstrual period?: n  Gastrointestinal Nausea?: No Vomiting?: No Indigestion/heartburn?: No Diarrhea?: No Constipation?: Yes  Constitutional Fever: No Night sweats?: No Weight loss?: No  Fatigue?: No  Skin Skin rash/lesions?: No Itching?: No  Eyes Blurred vision?: No Double vision?: No  Ears/Nose/Throat Sore throat?: No Sinus problems?: No  Hematologic/Lymphatic Swollen glands?: No Easy bruising?: No  Cardiovascular Leg swelling?: No Chest pain?: No  Respiratory Cough?: No Shortness of breath?: No  Endocrine Excessive thirst?: No  Musculoskeletal Back pain?: No Joint pain?: No  Neurological Headaches?: No Dizziness?: No  Psychologic Depression?: No Anxiety?: Yes  Physical Exam: BP 98/61   Pulse 79   Ht 5\' 10"  (1.778 m)   Wt 150 lb 12.8 oz (68.4 kg)   BMI 21.64 kg/m   Constitutional:  Alert and oriented, no acute distress, nontoxic appearing HEENT: [place], AT Cardiovascular: No clubbing, cyanosis, or edema Respiratory: Normal respiratory effort, no increased work of breathing GU: No CVA tenderness MSK: No point tenderness to the midline back or paraspinous muscles. Skin: No rashes, bruises or suspicious lesions Neurologic: Grossly intact, no focal deficits, moving all 4 extremities Psychiatric: Normal mood and flat affect  Laboratory Data: Results for orders placed or performed in visit on 01/07/19  Microscopic Examination   URINE  Result Value Ref Range    WBC, UA 0-5 0 - 5 /hpf   RBC 0-2 0 - 2 /hpf   Epithelial Cells (non renal) 0-10 0 - 10 /hpf   Renal Epithel, UA 0-10 (A) None seen /hpf   Bacteria, UA Few None seen/Few  Urinalysis, Complete  Result Value Ref Range   Specific Gravity, UA 1.020 1.005 - 1.030   pH, UA 7.0 5.0 - 7.5   Color, UA Yellow Yellow   Appearance Ur Hazy (A) Clear   Leukocytes,UA Trace (A) Negative   Protein,UA Negative Negative/Trace   Glucose, UA Negative Negative   Ketones, UA Trace (A) Negative   RBC, UA 2+ (A) Negative   Bilirubin, UA Negative Negative   Urobilinogen, Ur 0.2 0.2 - 1.0 mg/dL   Nitrite, UA Negative Negative   Microscopic Examination See below:    Assessment & Plan:   1. Chronic bladder pain I suspect her reported chronic bladder pain is multifactorial in etiology.  She does report chronic constipation with an exacerbation and initiation of a new bowel regimen within the last month.  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  Together, I suspect a certain degree of bowel bladder dysfunction in this patient.  Additionally, she recently completed treatment for acute cystitis.  I do suspect she has some degree of post infectious inflammation that is resolving.  Lastly, she was previously found to have findings consistent with IC.  She has been prescribed Elmer on on at least one occasion, however she reports she has been hesitant to start this medication due to concerns for side effects.  I explained to her that at this point, the best treatment options available to her include a trial of Elmer on and management of her bowel dysfunction.  Represcribed Elmer on today.  I explained to her that it may take several weeks for her to notice any benefit with this medication.  She expressed understanding.  If she fails this medication, future treatment options may include TCAs or pelvic floor rehab. - Urinalysis, Complete - pentosan polysulfate (ELMIRON) 100 MG capsule; Take 1 capsule (100 mg  total) by mouth 3 (three) times daily. Take with water at least 1 hour before meals or 2 hours after meals.  Dispense: 90 capsule; Refill: [facility] [place] [address], [place], [place]  [address] [phone]    

## 2019-01-07 NOTE — Telephone Encounter (Signed)
I called pt and she is coming in today at 2pm.

## 2019-01-17 ENCOUNTER — Ambulatory Visit: Payer: BC Managed Care – PPO | Admitting: Internal Medicine

## 2019-01-17 ENCOUNTER — Telehealth: Payer: Self-pay | Admitting: Physician Assistant

## 2019-01-17 NOTE — Telephone Encounter (Signed)
Pt called and states that she is still having pain in her kidneys and wanted to let you know.She denies any other symptoms. Please advise.

## 2019-01-21 NOTE — Telephone Encounter (Signed)
She is scheduled for a clinic visit with [facility] tomorrow. Larene Beach informed and will discuss with patient at that time.

## 2019-01-21 NOTE — Progress Notes (Deleted)
01/22/2019 9:16 AM   Cristina Wade [birth year] [id number]  Referring provider: [name], FNP [address] [place],  [place] [address]  No chief complaint on file.   HPI: [name] is 48 year old female with a history of hematuria, pelvic floor dysfunction, chronic pelvic pain, constipation, rUTI's and nephrolithiasis who presents today for the complaint of bladder pain.  History of hematuria Non-smoker.  Work ups in 2009, 2014 and 2015 positive for punctate bilateral stones.  Cystoscopy in 02/2018 with Dr. Richardo Hanks was NED.  She has had several upper tract imaging since 02/2018 - all negative for concerning lesions.   She denies any gross hematuria.  Her UA today ***.    Pelvic floor dysfunction She has had extensive workup in the past including urodynamics (2016) which revealed delayed first sensation at 626cc, normal bladder capacity and pressures. Mild demonstrable stress urinary incontinence was appreciated. No evidence of urgency. EMG was consistent with poor relaxation of the external sphincter consistent with pseudo-DSD associated with incomplete bladder emptying. Shortly thereafter, she was referred to physical therapy and had 1 session. She was taught some techniques including raising her feet above the level of her bladder when voiding and some core exercises. She tried this for approximate 2 weeks and it did not help so she stopped. She underwent cystoscopy, excision of cervical polyp by Dr. Chauncey Cruel in 05/2016.  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 She is currently on tamsulosin 0.4 mg and has been instructed on timed voids.    Chronic pelvic pain Cystoscopy was negative in 02/2018.  Currently on Elmiron.    Constipation Abdominal xray's noted a small volume of stool.  Currently on Linzess.     rUTI's Risk factors: age, constipation and incomplete bladder empyting   Nephrolithiasis Punctate bilateral stones.         PMH: Past Medical History:  Diagnosis Date  . Asthma   . Chronic pelvic pain in female 2018  . Cystitis   . Cystocele   . Endometrial polyp   . Endometriosis    per pt report but never seen during surg  . Frequent headaches   . Gastritis   . GERD (gastroesophageal reflux disease)    rare  . Gross hematuria   . History of kidney stones   . Microscopic hematuria   . Migraine   . Migraines   . Osteopenia   . Urinary disorder   . UTI (lower urinary tract infection)     Surgical History: Past Surgical History:  Procedure Laterality Date  . COLONOSCOPY N/A 11/05/2014   Procedure: COLONOSCOPY;  Surgeon: Scot Jun, MD;  Location: [facility] ENDOSCOPY;  Service: Endoscopy;  Laterality: N/A;  . COLONOSCOPY  11/2016   Dr. Mechele Collin  . CYSTOSCOPY  2007   with biopsy   . CYSTOSCOPY N/A 06/01/2016   Procedure: CYSTOSCOPY;  Surgeon: Vena Austria, MD;  Location: [facility] ORS;  Service: Gynecology;  Laterality: N/A;  . DIAGNOSTIC LAPAROSCOPY    . DILATATION & CURETTAGE/HYSTEROSCOPY WITH MYOSURE N/A 04/13/2015   Procedure: DILATATION & CURETTAGE/HYSTEROSCOPY WITH MYOSURE/POLYPECTOMY;  Surgeon: Elenora Fender Ward, MD;  Location: [facility] ORS;  Service: Gynecology;  Laterality: N/A;  . DILATATION & CURETTAGE/HYSTEROSCOPY WITH MYOSURE N/A 06/01/2016   Procedure: DILATATION & CURETTAGE/HYSTEROSCOPY WITH MYOSURE;  Surgeon: Vena Austria, MD;  Location: [facility] ORS;  Service: Gynecology;  Laterality: N/A;  . GUM SURGERY    . laproscopy  2007  . POLYPECTOMY     endometrial    Home Medications:  Allergies as of 01/22/2019      Reactions   Lac Bovis Other (See Comments)   Milk-related Compounds    Omeprazole Other (See Comments)   Pt reports "Chest pain, HA and Dysuria."   Penicillins Itching, Rash      Medication List       Accurate as of January 21, 2019  9:16 AM. If you have any questions, ask your nurse or doctor.        CALCIUM  1200 PO Take 2-3 tablets by mouth daily. Reported on 05/26/2015   dicyclomine 20 MG tablet Commonly known as: Bentyl Take 1 tablet (20 mg total) by mouth 3 (three) times daily as needed for spasms.   docusate sodium 100 MG capsule Commonly known as: COLACE Take 1 capsule by mouth QID.   escitalopram 10 MG tablet Commonly known as: Lexapro Take 1 tablet (10 mg total) by mouth daily.   ferrous sulfate 325 (65 FE) MG tablet Take 325 mg by mouth daily.   levonorgestrel-ethinyl estradiol 0.15-0.03 MG tablet Commonly known as: Jolessa Take 1 tablet by mouth daily.   linaclotide 72 MCG capsule Commonly known as: LINZESS Take by mouth.   metoCLOPramide 5 MG tablet Commonly known as: Reglan Take 1 tablet (5 mg total) by mouth every 8 (eight) hours as needed for up to 2 days for nausea or vomiting.   ondansetron 4 MG disintegrating tablet Commonly known as: Zofran ODT Take 1 tablet (4 mg total) by mouth every 8 (eight) hours as needed for nausea or vomiting.   pentosan polysulfate 100 MG capsule Commonly known as: ELMIRON Take 1 capsule (100 mg total) by mouth 3 (three) times daily. Take with water at least 1 hour before meals or 2 hours after meals.   sulfamethoxazole-trimethoprim 800-160 MG tablet Commonly known as: BACTRIM DS Take 1 tablet by mouth every 12 (twelve) hours.   SUMAtriptan 100 MG tablet Commonly known as: IMITREX Take 100 mg by mouth every 2 (two) hours as needed. May repeat in 2 hours if headache persists or recurs.   tamsulosin 0.4 MG Caps capsule Commonly known as: FLOMAX Take 1 capsule (0.4 mg total) by mouth daily.   vitamin B-12 1000 MCG tablet Commonly known as: CYANOCOBALAMIN Take 1,000 mcg by mouth daily.       Allergies:  Allergies  Allergen Reactions  . Lac Bovis Other (See Comments)  . Milk-Related Compounds   . Omeprazole Other (See Comments)    Pt reports "Chest pain, HA and Dysuria."  . Penicillins Itching and Rash    Family  History: Family History  Problem Relation Age of Onset  . Colon cancer Father 1350  . Arthritis Father   . Hyperlipidemia Father   . Transient ischemic attack Father   . Cancer - Colon Father [id number]  . Prostate cancer Father 1068  . Valvular heart disease Father   . Arthritis Mother   . Heart disease Mother   . Stroke Mother        TIA  . Hypertension Mother   . Diabetes Mother   . Kidney cancer Mother 2645  . Heart failure Mother   . Hyperlipidemia Mother   . Cancer Maternal Grandmother   . Cancer Paternal Grandfather        prostate  . Breast cancer Cousin [phone]    Social History:  reports that she has never smoked. She has never used smokeless tobacco.  She reports that she does not drink alcohol or use drugs.  ROS:                                        Physical Exam: There were no vitals taken for this visit.  Constitutional:  Well nourished. Alert and oriented, No acute distress. HEENT: [place] AT, moist mucus membranes.  Trachea midline, no masses. Cardiovascular: No clubbing, cyanosis, or edema. Respiratory: Normal respiratory effort, no increased work of breathing. GI: Abdomen is soft, non tender, non distended, no abdominal masses. Liver and spleen not palpable.  No hernias appreciated.  Stool sample for occult testing is not indicated.   GU: No CVA tenderness.  No bladder fullness or masses.  *** external genitalia, *** pubic hair distribution, no lesions.  Normal urethral meatus, no lesions, no prolapse, no discharge.   No urethral masses, tenderness and/or tenderness. No bladder fullness, tenderness or masses. *** vagina mucosa, *** estrogen effect, no discharge, no lesions, *** pelvic support, *** cystocele and *** rectocele noted.  No cervical motion tenderness.  Uterus is freely mobile and non-fixed.  No adnexal/parametria masses or tenderness noted.  Anus and perineum are without rashes or lesions.   ***  Skin: No rashes, bruises or suspicious lesions.  Lymph: No cervical or inguinal adenopathy. Neurologic: Grossly intact, no focal deficits, moving all 4 extremities. Psychiatric: Normal mood and affect.   Laboratory Data: Lab Results  Component Value Date   WBC 6.0 12/28/2018   HGB 13.5 12/28/2018   HCT 41.0 12/28/2018   MCV 90.1 12/28/2018   PLT 247 12/28/2018    Lab Results  Component Value Date   CREATININE 0.77 12/28/2018    No results found for: PSA  No results found for: TESTOSTERONE  Lab Results  Component Value Date   HGBA1C 5.4 06/03/2018    Lab Results  Component Value Date   TSH 1.46 06/03/2018       Component Value Date/Time   CHOL 178 11/29/2016 0753   HDL 50.60 11/29/2016 0753   CHOLHDL 4 11/29/2016 0753   VLDL 14.0 11/29/2016 0753   LDLCALC 113 (H) 11/29/2016 0753    Lab Results  Component Value Date   AST 14 (L) 12/28/2018   Lab Results  Component Value Date   ALT 14 12/28/2018   No components found for: ALKALINEPHOPHATASE No components found for: BILIRUBINTOTAL  No results found for: ESTRADIOL  Urinalysis    Component Value Date/Time   COLORURINE STRAW (A) 12/28/2018 1724   APPEARANCEUR Hazy (A) 01/07/2019 1430   LABSPEC 1.004 (L) 12/28/2018 1724   PHURINE 8.0 12/28/2018 1724   GLUCOSEU Negative 01/07/2019 1430   GLUCOSEU NEGATIVE 06/03/2018 1637   HGBUR SMALL (A) 12/28/2018 1724   BILIRUBINUR Negative 01/07/2019 1430   KETONESUR NEGATIVE 12/28/2018 1724   PROTEINUR Negative 01/07/2019 1430   PROTEINUR NEGATIVE 12/28/2018 1724   UROBILINOGEN 0.2 06/13/2018 1458   UROBILINOGEN 0.2 06/03/2018 1637   NITRITE Negative 01/07/2019 1430   NITRITE NEGATIVE 12/28/2018 1724   LEUKOCYTESUR Trace (A) 01/07/2019 1430   LEUKOCYTESUR NEGATIVE 12/28/2018 1724    I have reviewed the labs.   Pertinent Imaging: *** I have independently reviewed the films.    Assessment & Plan:  ***  1. History of hematuria Hematuria work up completed in 02/2018 - findings positive for punctate  bilateral stones No report of gross hematuria *** UA today *** RTC  in one year for UA - patient to report any gross hematuria in the interim    2. Pelvic floor dysfunction psuedo-DSD seen on UDS in 2016  3. Chronic pelvic pain ***  4. Constipation ***  5. rUTI's ***  6. Nephrolithiasis Punctate bilateral renal stones  No follow-ups on file.  These notes generated with voice recognition software. I apologize for typographical errors.  Michiel Cowboy, PA-C  [facility] [address]  [address] [place], [place] [address] [phone]

## 2019-01-22 ENCOUNTER — Ambulatory Visit: Payer: BC Managed Care – PPO | Admitting: Urology

## 2019-03-03 ENCOUNTER — Other Ambulatory Visit: Payer: Self-pay | Admitting: Obstetrics and Gynecology

## 2019-03-03 DIAGNOSIS — Z1231 Encounter for screening mammogram for malignant neoplasm of breast: Secondary | ICD-10-CM

## 2019-03-19 ENCOUNTER — Other Ambulatory Visit: Payer: Self-pay

## 2019-03-19 ENCOUNTER — Encounter: Payer: Self-pay | Admitting: Obstetrics and Gynecology

## 2019-03-19 ENCOUNTER — Ambulatory Visit (INDEPENDENT_AMBULATORY_CARE_PROVIDER_SITE_OTHER): Payer: BC Managed Care – PPO | Admitting: Obstetrics and Gynecology

## 2019-03-19 VITALS — BP 124/80 | Ht 70.0 in | Wt 156.0 lb

## 2019-03-19 DIAGNOSIS — Z1231 Encounter for screening mammogram for malignant neoplasm of breast: Secondary | ICD-10-CM

## 2019-03-19 DIAGNOSIS — M8580 Other specified disorders of bone density and structure, unspecified site: Secondary | ICD-10-CM

## 2019-03-19 DIAGNOSIS — N898 Other specified noninflammatory disorders of vagina: Secondary | ICD-10-CM | POA: Diagnosis not present

## 2019-03-19 DIAGNOSIS — L700 Acne vulgaris: Secondary | ICD-10-CM

## 2019-03-19 DIAGNOSIS — Z13820 Encounter for screening for osteoporosis: Secondary | ICD-10-CM

## 2019-03-19 DIAGNOSIS — Z01419 Encounter for gynecological examination (general) (routine) without abnormal findings: Secondary | ICD-10-CM

## 2019-03-19 DIAGNOSIS — N951 Menopausal and female climacteric states: Secondary | ICD-10-CM

## 2019-03-19 LAB — POCT WET PREP WITH KOH
Clue Cells Wet Prep HPF POC: NEGATIVE
KOH Prep POC: NEGATIVE
Trichomonas, UA: NEGATIVE
Yeast Wet Prep HPF POC: NEGATIVE

## 2019-03-19 MED ORDER — FLUCONAZOLE 150 MG PO TABS: 150.0000 mg | ORAL_TABLET | Freq: Once | ORAL | 0 refills | Status: AC

## 2019-03-19 NOTE — Patient Instructions (Signed)
I value your feedback and entrusting us with your care. If you get a [facility] patient survey, I would appreciate you taking the time to let us know about your experience today. Thank you! 

## 2019-03-19 NOTE — Progress Notes (Signed)
PCP:  Burnard Hawthorne, FNP   Chief Complaint  Patient presents with  . Gynecologic Exam  . Vaginal Discharge    itchiness/irritation, no odor x 1 week     HPI:      [name] is a 48 y.o. No obstetric history on file. who LMP was Patient's last menstrual period was 03/04/2019 (exact date)., presents today for her annual examination.  Her menses are Q3 months with OCPs, lasting 2 days, light. Dysmenorrhea none. 1 episode spotting this last menses on 1st wk of new pill pack--unusual for pt.  Sex activity: never Pt complains of increased vag d/c with irritation/itch without odor for the past wk. Sx improving. No meds to treat. Was on abx a couple months ago. No new soaps/detergents.  Last Pap: 03/13/18  Results were: no abnormalities/ neg HPV DNA 2018. Likes yearly paps but not indicated/covered by ins.  Hx of STDs: none  Last mammogram: 04/08/18  Results were: normal--routine follow-up in 12 months. Mammo sched 04/15/19 There is a FH of breast cancer in her mat cousin, genetic testing not indicated for pt. There is no FH of ovarian cancer. The patient does not do self-breast exams.  Tobacco use: The patient denies current or previous tobacco use. Alcohol use: none No drug use.  Exercise: moderately active  She does get adequate calcium and Vitamin D in her diet.  Labs with PCP with 2019.   Colonoscopy done 2018, repeat after 5 yrs due to [place] of colon cancer in her father. Pt doesn't qualify for cancer genetic testing.  DEXA: In her 67s, don't know why (can't find report), hx of osteopenia per pt report; pt was on OCPs/having menses. Due for repeat at age 13 per Dr. Ammie Dalton.  She has a hx of constipation, seeing GI, tried linzess with side effects. Now on fiber supp with sx relief. No longer having pelvic pain.  Past Medical History:  Diagnosis Date  . Asthma   . Chronic pelvic pain in female 2018  . Cystitis   . Cystocele   . Endometrial polyp   .  Endometriosis    per pt report but never seen during surg  . Frequent headaches   . Gastritis   . GERD (gastroesophageal reflux disease)    rare  . Gross hematuria   . History of kidney stones   . Microscopic hematuria   . Migraine   . Migraines   . Osteopenia   . Urinary disorder   . UTI (lower urinary tract infection)     Past Surgical History:  Procedure Laterality Date  . COLONOSCOPY N/A 11/05/2014   Procedure: COLONOSCOPY;  Surgeon: Manya Silvas, MD;  Location: [facility] ENDOSCOPY;  Service: Endoscopy;  Laterality: N/A;  . COLONOSCOPY  11/2016   Dr. Vira Agar  . CYSTOSCOPY  2007   with biopsy   . CYSTOSCOPY N/A 06/01/2016   Procedure: CYSTOSCOPY;  Surgeon: Malachy Mood, MD;  Location: [facility] ORS;  Service: Gynecology;  Laterality: N/A;  . DIAGNOSTIC LAPAROSCOPY    . DILATATION & CURETTAGE/HYSTEROSCOPY WITH MYOSURE N/A 04/13/2015   Procedure: DILATATION & CURETTAGE/HYSTEROSCOPY WITH MYOSURE/POLYPECTOMY;  Surgeon: Honor Loh Ward, MD;  Location: [facility] ORS;  Service: Gynecology;  Laterality: N/A;  . DILATATION & CURETTAGE/HYSTEROSCOPY WITH MYOSURE N/A 06/01/2016   Procedure: DILATATION & CURETTAGE/HYSTEROSCOPY WITH MYOSURE;  Surgeon: Malachy Mood, MD;  Location: [facility] ORS;  Service: Gynecology;  Laterality: N/A;  . GUM SURGERY    . laproscopy  2007  . POLYPECTOMY  endometrial    Family History  Problem Relation Age of Onset  . Colon cancer Father 72  . Arthritis Father   . Hyperlipidemia Father   . Transient ischemic attack Father   . Cancer - Colon Father 74  . Prostate cancer Father 49  . Valvular heart disease Father   . Arthritis Mother   . Heart disease Mother   . Stroke Mother        TIA  . Hypertension Mother   . Diabetes Mother   . Kidney cancer Mother 9  . Heart failure Mother   . Hyperlipidemia Mother   . Cancer Maternal Grandmother   . Cancer Paternal Grandfather        prostate  . Breast cancer Cousin 35       has contact    Social History    Socioeconomic History  . Marital status: Single    Spouse name: Not on file  . Number of children: Not on file  . Years of education: Not on file  . Highest education level: Not on file  Occupational History  . Not on file  Social Needs  . Financial resource strain: Not on file  . Food insecurity    Worry: Not on file    Inability: Not on file  . Transportation needs    Medical: Not on file    Non-medical: Not on file  Tobacco Use  . Smoking status: Never Smoker  . Smokeless tobacco: Never Used  Substance and Sexual Activity  . Alcohol use: No  . Drug use: No  . Sexual activity: Not Currently    Birth control/protection: Pill  Lifestyle  . Physical activity    Days per week: Not on file    Minutes per session: Not on file  . Stress: Not on file  Relationships  . Social Musician on phone: Not on file    Gets together: Not on file    Attends religious service: Not on file    Active member of club or organization: Not on file    Attends meetings of clubs or organizations: Not on file    Relationship status: Not on file  . Intimate partner violence    Fear of current or ex partner: Not on file    Emotionally abused: Not on file    Physically abused: Not on file    Forced sexual activity: Not on file  Other Topics Concern  . Not on file  Social History Narrative   Lives with twin sister   Work- Film/video editor [employer]    No pets    No children    Right handed    No caffeine daily- tea occasionally; eats chocolate    Enjoys shopping, resting, [place]     Current Meds  Medication Sig  . Calcium Carbonate-Vit D-Min (CALCIUM 1200 PO) Take 2-3 tablets by mouth daily. Reported on 05/26/2015  . levonorgestrel-ethinyl estradiol (JOLESSA) 0.15-0.03 MG tablet Take 1 tablet by mouth daily.     ROS:  Review of Systems  Constitutional: Negative for fatigue, fever and unexpected weight change.  Respiratory: Negative for cough,  shortness of breath and wheezing.   Cardiovascular: Negative for chest pain, palpitations and leg swelling.  Gastrointestinal: Positive for constipation. Negative for blood in stool, diarrhea, nausea and vomiting.  Endocrine: Negative for cold intolerance, heat intolerance and polyuria.  Genitourinary: Negative for dyspareunia, dysuria, flank pain, frequency, genital sores, hematuria, menstrual problem, pelvic  pain, urgency, vaginal bleeding, vaginal discharge and vaginal pain.  Musculoskeletal: Negative for back pain, joint swelling and myalgias.  Skin: Negative for rash.  Neurological: Negative for dizziness, syncope, light-headedness, numbness and headaches.  Hematological: Negative for adenopathy.  Psychiatric/Behavioral: Negative for agitation, confusion, sleep disturbance and suicidal ideas. The patient is not nervous/anxious.      Objective: BP 124/80   Ht 5\' 10"  (1.778 m)   Wt 156 lb (70.8 kg)   LMP 03/04/2019 (Exact Date)   BMI 22.38 kg/m    Physical Exam Constitutional:      Appearance: She is well-developed.  Genitourinary:     Vulva, vagina, cervix, uterus, right adnexa and left adnexa normal.     No vulval lesion or tenderness noted.     No vaginal discharge, erythema or tenderness.     No cervical polyp.     Uterus is not enlarged or tender.     No right or left adnexal mass present.     Right adnexa not tender.     Left adnexa not tender.  Neck:     Musculoskeletal: Normal range of motion.     Thyroid: No thyromegaly.  Cardiovascular:     Rate and Rhythm: Normal rate and regular rhythm.     Heart sounds: Normal heart sounds. No murmur.  Pulmonary:     Effort: Pulmonary effort is normal.     Breath sounds: Normal breath sounds.  Chest:     Breasts:        Right: No mass, nipple discharge, skin change or tenderness.        Left: No mass, nipple discharge, skin change or tenderness.  Abdominal:     Palpations: Abdomen is soft.     Tenderness: There is no  abdominal tenderness. There is no guarding.  Musculoskeletal: Normal range of motion.  Neurological:     General: No focal deficit present.     Mental Status: She is alert and oriented to person, place, and time.     Cranial Nerves: No cranial nerve deficit.  Skin:    General: Skin is warm and dry.  Psychiatric:        Mood and Affect: Mood normal.        Behavior: Behavior normal.        Thought Content: Thought content normal.        Judgment: Judgment normal.  Vitals signs reviewed.    RESULTS:  Results for orders placed or performed in visit on 03/19/19 (from the past 24 hour(s))  POCT Wet Prep with KOH     Status: Normal   Collection Time: 03/19/19  5:15 PM  Result Value Ref Range   Trichomonas, UA Negative    Clue Cells Wet Prep HPF POC neg    Epithelial Wet Prep HPF POC     Yeast Wet Prep HPF POC neg    Bacteria Wet Prep HPF POC     RBC Wet Prep HPF POC     WBC Wet Prep HPF POC     KOH Prep POC Negative Negative    Assessment/Plan: Encounter for annual routine gynecological examination  Encounter for screening mammogram for malignant neoplasm of breast; pt has mammo sched  Perimenopause--Given age/light menses, will stop OCPs to see if menopausal. Can restart OCs if starts cycling again. If so, pt wants to try different OCP due to acne. F/u prn.  Vaginal itching - Plan: POCT Wet Prep with KOH, fluconazole (DIFLUCAN) 150 MG tablet; Neg wet prep/pos  sx. Treat empirically with diflucan. F/u prn.   Acne vulgaris--d/c OCPs and see what sx do. Also try differin gel OTC.  Screening for osteoporosis - Plan: DG Bone Density; Will f/u with results. Pt getting ca/Vit D and on OCPs.   Osteopenia, unspecified location--per pt report; unable to locate records.   Meds ordered this encounter  Medications  . fluconazole (DIFLUCAN) 150 MG tablet    Sig: Take 1 tablet (150 mg total) by mouth once for 1 dose.    Dispense:  1 tablet    Refill:  0    Order Specific Question:    Supervising Provider    Answer:   Nadara Mustard [[id number]]    GYN counsel mammography screening, adequate intake of calcium and vitamin D, diet and exercise     F/U  Return in about 1 year (around 03/18/2020).   B. , PA-C 03/19/2019 5:16 PM

## 2019-03-21 ENCOUNTER — Telehealth: Payer: Self-pay

## 2019-03-21 NOTE — Telephone Encounter (Signed)
Patient saw [place] for AE 09/17/2018. ABC asked if she was having pelvic pain and she wasn't then, but now she's starting to hurt. Her ovaries feel like they are hurting. Inquiring if she needs to be seen. [phone]

## 2019-03-21 NOTE — Telephone Encounter (Signed)
Spoke w/patient. She has pain that comes and goes. She wasn't having it at her apt but starting hurting today. She has not tried any methods to relieve pain. Advised can try OTC Ibuprofen or Aleve and use heating pad. Monitor pain and contact us back if no relief with these recommendations or if pain worsens.

## 2019-03-28 ENCOUNTER — Telehealth: Payer: Self-pay | Admitting: Obstetrics and Gynecology

## 2019-03-28 NOTE — Telephone Encounter (Signed)
Cristina Wade should be scheduling and will call pt with appt info

## 2019-03-28 NOTE — Telephone Encounter (Signed)
Patient following up on whether bone density is needed and going to be scheduled. Aware ABC out of office until Monday.

## 2019-03-28 NOTE — Telephone Encounter (Signed)
Please advise 

## 2019-03-31 ENCOUNTER — Telehealth: Payer: Self-pay | Admitting: Obstetrics and Gynecology

## 2019-03-31 ENCOUNTER — Ambulatory Visit: Payer: BC Managed Care – PPO | Admitting: Obstetrics and Gynecology

## 2019-03-31 NOTE — Telephone Encounter (Signed)
Patient aware of her appt at [facility] on 04/15/2019 at 1:00 for her mammogram and her DEXA scan. Pattient aware not calcium supplements before the scan

## 2019-03-31 NOTE — Telephone Encounter (Signed)
Pt aware.

## 2019-04-02 ENCOUNTER — Telehealth: Payer: Self-pay | Admitting: Obstetrics and Gynecology

## 2019-04-02 DIAGNOSIS — R102 Pelvic and perineal pain: Secondary | ICD-10-CM

## 2019-04-02 NOTE — Telephone Encounter (Signed)
Patient is schedule for 04/02/19 at 11:30 please place order. Thank you

## 2019-04-02 NOTE — Telephone Encounter (Signed)
Patient is calling with pelvic pain is requesting ultrasound schedule at the hospital. Please advise

## 2019-04-02 NOTE — Telephone Encounter (Signed)
Done. Thx.

## 2019-04-02 NOTE — Telephone Encounter (Signed)
Pt having pelvic pain/LLQ pain, radiating to back, achy and sharp and can barely stand it. Sx since last wk. Taking ibup without relief. Hx of pelvic pain in past, worse with constipation, but having regular BM now, daily or QOD. Doesn't feel like same pain as in past. Thought had endometriosis but surg path reviewed and no mention of endometriosis. No fevers, no urin sx. Will sched GYN u/s tomorrow in office and call pt with results.

## 2019-04-03 ENCOUNTER — Other Ambulatory Visit: Payer: Self-pay

## 2019-04-03 ENCOUNTER — Ambulatory Visit (INDEPENDENT_AMBULATORY_CARE_PROVIDER_SITE_OTHER): Payer: BC Managed Care – PPO

## 2019-04-03 DIAGNOSIS — R102 Pelvic and perineal pain: Secondary | ICD-10-CM | POA: Diagnosis not present

## 2019-04-07 ENCOUNTER — Telehealth: Payer: Self-pay

## 2019-04-07 NOTE — Telephone Encounter (Signed)
Pt called stating she recently had an ultrasound that ABC had ordered. She has also had one with her PCP and they said her bowels were full of stool. She os wanting to know if ABC could see that on the Ultrasound that was done here/ CB# [phone]

## 2019-04-07 NOTE — Telephone Encounter (Signed)
Pls advise.  

## 2019-04-08 ENCOUNTER — Other Ambulatory Visit: Payer: Self-pay

## 2019-04-08 ENCOUNTER — Emergency Department
Admission: EM | Admit: 2019-04-08 | Discharge: 2019-04-08 | Disposition: A | Discharge status: Home / Self Care | Payer: BC Managed Care – PPO | Attending: Student in an Organized Health Care Education/Training Program | Admitting: Student in an Organized Health Care Education/Training Program

## 2019-04-08 ENCOUNTER — Emergency Department: Payer: BC Managed Care – PPO

## 2019-04-08 DIAGNOSIS — R110 Nausea: Secondary | ICD-10-CM | POA: Diagnosis not present

## 2019-04-08 DIAGNOSIS — J45909 Unspecified asthma, uncomplicated: Secondary | ICD-10-CM | POA: Insufficient documentation

## 2019-04-08 DIAGNOSIS — R1032 Left lower quadrant pain: Secondary | ICD-10-CM | POA: Diagnosis not present

## 2019-04-08 DIAGNOSIS — Z79899 Other long term (current) drug therapy: Secondary | ICD-10-CM | POA: Insufficient documentation

## 2019-04-08 LAB — CBC
HCT: 40.1 % (ref 36.0–46.0)
Hemoglobin: 13.4 g/dL (ref 12.0–15.0)
MCH: 29.8 pg (ref 26.0–34.0)
MCHC: 33.4 g/dL (ref 30.0–36.0)
MCV: 89.1 fL (ref 80.0–100.0)
Platelets: 256 10*3/uL (ref 150–400)
RBC: 4.5 MIL/uL (ref 3.87–5.11)
RDW: 12.6 % (ref 11.5–15.5)
WBC: 8 10*3/uL (ref 4.0–10.5)
nRBC: 0 % (ref 0.0–0.2)

## 2019-04-08 LAB — URINALYSIS, COMPLETE (UACMP) WITH MICROSCOPIC
Bacteria, UA: NONE SEEN
Bilirubin Urine: NEGATIVE
Glucose, UA: NEGATIVE mg/dL
Ketones, ur: NEGATIVE mg/dL
Leukocytes,Ua: NEGATIVE
Nitrite: NEGATIVE
Protein, ur: NEGATIVE mg/dL
Specific Gravity, Urine: 1.01 (ref 1.005–1.030)
Squamous Epithelial / HPF: NONE SEEN (ref 0–5)
pH: 8 (ref 5.0–8.0)

## 2019-04-08 LAB — COMPREHENSIVE METABOLIC PANEL
ALT: 12 U/L (ref 0–44)
AST: 16 U/L (ref 15–41)
Albumin: 3.9 g/dL (ref 3.5–5.0)
Alkaline Phosphatase: 32 U/L — ABNORMAL LOW (ref 38–126)
Anion gap: 7 (ref 5–15)
BUN: 11 mg/dL (ref 6–20)
CO2: 24 mmol/L (ref 22–32)
Calcium: 9 mg/dL (ref 8.9–10.3)
Chloride: 105 mmol/L (ref 98–111)
Creatinine, Ser: 0.99 mg/dL (ref 0.44–1.00)
GFR calc Af Amer: >60 mL/min (ref >60)
GFR calc non Af Amer: >60 mL/min (ref >60)
Glucose, Bld: 107 mg/dL — ABNORMAL HIGH (ref 70–99)
Potassium: 3.9 mmol/L (ref 3.5–5.1)
Sodium: 136 mmol/L (ref 135–145)
Total Bilirubin: 0.8 mg/dL (ref 0.3–1.2)
Total Protein: 7.1 g/dL (ref 6.5–8.1)

## 2019-04-08 LAB — LIPASE, BLOOD: Lipase: 41 U/L (ref 11–51)

## 2019-04-08 LAB — PREGNANCY, URINE: Preg Test, Ur: NEGATIVE

## 2019-04-08 MED ORDER — IOHEXOL 300 MG/ML  SOLN
100.0000 mL | Freq: Once | INTRAMUSCULAR | Status: AC | PRN
  Administered 2019-04-08: 100 mL via INTRAVENOUS
  Filled 2019-04-08: qty 100

## 2019-04-08 MED ORDER — SODIUM CHLORIDE 0.9 % IV BOLUS
500.0000 mL | Freq: Once | INTRAVENOUS | Status: AC
  Administered 2019-04-08: 500 mL via INTRAVENOUS

## 2019-04-08 NOTE — Telephone Encounter (Signed)
We didn't see on the ultrasound but different imaging modalities see different things.

## 2019-04-08 NOTE — Discharge Instructions (Signed)

## 2019-04-08 NOTE — ED Triage Notes (Signed)
Pt A&O, ambulatory. C/o of lower abd/pelvic/flank pain. States bilat pelvic pain but only L flank pain. States generalized abd pain. Was seen at [facility] Saturday and told she was constipation. Taken OTC meds with no relief. Denies urinary symptoms. Had [place] last Thursday from [facility]. Was told her problems are GI related not OB related. At [facility] they told her that it's a GI problem as well. Called GI and they said they aren't seeing pts d/t COVID and to come to ER.

## 2019-04-08 NOTE — ED Provider Notes (Signed)
[facility] Emergency Department Provider Note    First MD Initiated Contact with Patient 04/08/19 1341     (approximate)  I have reviewed the triage vital signs and the nursing notes.   HISTORY  Chief Complaint Pelvic Pain and Flank Pain    HPI Cristina Wade is a 48 y.o. female presents the ER for evaluation of left-sided abdominal pain has been intermittent for the past week.  States is in the left lower quadrant is intermittently associated with nausea.  Was worked up for similar pain by her OB/GYN had reassuring pelvic ultrasound.  Denies any discharge or bleeding.  Also with the PCP was told this might be related to constipation also possible diverticulitis and was placed on Cipro but her symptoms are persisting.  She is having chills but no measured fevers.  She is moving her bowels.  Is currently pain-free.    Past Medical History:  Diagnosis Date  . Asthma   . Chronic pelvic pain in female 2018  . Cystitis   . Cystocele   . Endometrial polyp   . Endometriosis    per pt report but never seen during surg  . Frequent headaches   . Gastritis   . GERD (gastroesophageal reflux disease)    rare  . Gross hematuria   . History of kidney stones   . Microscopic hematuria   . Migraine   . Migraines   . Osteopenia   . Urinary disorder   . UTI (lower urinary tract infection)    Family History  Problem Relation Age of Onset  . Colon cancer Father 63  . Arthritis Father   . Hyperlipidemia Father   . Transient ischemic attack Father   . Cancer - Colon Father 9  . Prostate cancer Father 52  . Valvular heart disease Father   . Arthritis Mother   . Heart disease Mother   . Stroke Mother        TIA  . Hypertension Mother   . Diabetes Mother   . Kidney cancer Mother 61  . Heart failure Mother   . Hyperlipidemia Mother   . Cancer Maternal Grandmother   . Cancer Paternal Grandfather        prostate  . Breast cancer Cousin 35       has contact     Past Surgical History:  Procedure Laterality Date  . COLONOSCOPY N/A 11/05/2014   Procedure: COLONOSCOPY;  Surgeon: Scot Jun, MD;  Location: [facility] ENDOSCOPY;  Service: Endoscopy;  Laterality: N/A;  . COLONOSCOPY  11/2016   Dr. Mechele Collin  . CYSTOSCOPY  2007   with biopsy   . CYSTOSCOPY N/A 06/01/2016   Procedure: CYSTOSCOPY;  Surgeon: Vena Austria, MD;  Location: [facility] ORS;  Service: Gynecology;  Laterality: N/A;  . DIAGNOSTIC LAPAROSCOPY    . DILATATION & CURETTAGE/HYSTEROSCOPY WITH MYOSURE N/A 04/13/2015   Procedure: DILATATION & CURETTAGE/HYSTEROSCOPY WITH MYOSURE/POLYPECTOMY;  Surgeon: Elenora Fender Ward, MD;  Location: [facility] ORS;  Service: Gynecology;  Laterality: N/A;  . DILATATION & CURETTAGE/HYSTEROSCOPY WITH MYOSURE N/A 06/01/2016   Procedure: DILATATION & CURETTAGE/HYSTEROSCOPY WITH MYOSURE;  Surgeon: Vena Austria, MD;  Location: [facility] ORS;  Service: Gynecology;  Laterality: N/A;  . GUM SURGERY    . laproscopy  2007  . POLYPECTOMY     endometrial   Patient Active Problem List   Diagnosis Date Noted  . Urinary retention 08/12/2018  . Anxiety 08/12/2018  . Elevated blood pressure reading without diagnosis of hypertension 07/12/2018  .  Left leg pain 07/12/2018  . Acute left-sided low back pain 06/03/2018  . Cloudy urine 04/29/2018  . Chest pain 04/29/2018  . Frontal headache 04/27/2018  . Localized, primary osteoarthritis 02/07/2018  . Pelvic pain 01/30/2018  . Slow transit constipation 10/10/2017  . Bilateral impacted cerumen 11/28/2016  . Chronic bilateral low back pain without sciatica 11/28/2016  . Precordial pain 07/07/2016  . Right bundle branch block (RBBB) on electrocardiogram (ECG) 07/07/2016  . Chest tightness 06/27/2016  . Preventative health care 09/30/2015  . Encounter to establish care 09/28/2014  . Vestibular migraine 09/28/2014  . Encounter for screening colonoscopy 11/21/2013      Prior to Admission medications   Medication Sig Start Date  End Date Taking? Authorizing Provider  Calcium Carbonate-Vit D-Min (CALCIUM 1200 PO) Take 2-3 tablets by mouth daily. Reported on 05/26/2015    [provider]  escitalopram (LEXAPRO) 10 MG tablet Take 1 tablet (10 mg total) by mouth daily. Patient not taking: Reported on 03/19/2019 09/30/18   Burnard Hawthorne, FNP  levonorgestrel-ethinyl estradiol (JOLESSA) 0.15-0.03 MG tablet Take 1 tablet by mouth daily. 00/93/81   Copland, Alicia B, PA-C    Allergies Lac bovis, Milk-related compounds, Omeprazole, and Penicillins    Social History Social History   Tobacco Use  . Smoking status: Never Smoker  . Smokeless tobacco: Never Used  Substance Use Topics  . Alcohol use: No  . Drug use: No    Review of Systems Patient denies headaches, rhinorrhea, blurry vision, numbness, shortness of breath, chest pain, edema, cough, abdominal pain, nausea, vomiting, diarrhea, dysuria, fevers, rashes or hallucinations unless otherwise stated above in HPI. ____________________________________________   PHYSICAL EXAM:  VITAL SIGNS: Vitals:   04/08/19 1246  BP: 130/73  Pulse: 85  Resp: 17  Temp: 98.2 F (36.8 C)  SpO2: 99%    Constitutional: Alert and oriented.  Eyes: Conjunctivae are normal.  Head: Atraumatic. Nose: No congestion/rhinnorhea. Mouth/Throat: Mucous membranes are moist.   Neck: No stridor. Painless ROM.  Cardiovascular: Normal rate, regular rhythm. Grossly normal heart sounds.  Good peripheral circulation. Respiratory: Normal respiratory effort.  No retractions. Lungs CTAB. Gastrointestinal: Soft and nontender. No distention. No abdominal bruits. No CVA tenderness. Genitourinary: deferred Musculoskeletal: No lower extremity tenderness nor edema.  No joint effusions. Neurologic:  Normal speech and language. No gross focal neurologic deficits are appreciated. No facial droop Skin:  Skin is warm, dry and intact. No rash noted. Psychiatric: Mood and affect are normal.  Speech and behavior are normal.  ____________________________________________   LABS (all labs ordered are listed, but only abnormal results are displayed)  Results for orders placed or performed during the hospital encounter of 04/08/19 (from the past 24 hour(s))  Lipase, blood     Status: None   Collection Time: 04/08/19  1:00 PM  Result Value Ref Range   Lipase 41 11 - 51 U/L  Comprehensive metabolic panel     Status: Abnormal   Collection Time: 04/08/19  1:00 PM  Result Value Ref Range   Sodium 136 135 - 145 mmol/L   Potassium 3.9 3.5 - 5.1 mmol/L   Chloride 105 98 - 111 mmol/L   CO2 24 22 - 32 mmol/L   Glucose, Bld 107 (H) 70 - 99 mg/dL   BUN 11 6 - 20 mg/dL   Creatinine, Ser 0.99 0.44 - 1.00 mg/dL   Calcium 9.0 8.9 - 10.3 mg/dL   Total Protein 7.1 6.5 - 8.1 g/dL   Albumin 3.9 3.5 - 5.0 g/dL  AST 16 15 - 41 U/L   ALT 12 0 - 44 U/L   Alkaline Phosphatase 32 (L) 38 - 126 U/L   Total Bilirubin 0.8 0.3 - 1.2 mg/dL   GFR calc non Af Amer >60 >60 mL/min   GFR calc Af Amer >60 >60 mL/min   Anion gap 7 5 - 15  CBC     Status: None   Collection Time: 04/08/19  1:00 PM  Result Value Ref Range   WBC 8.0 4.0 - 10.5 K/uL   RBC 4.50 3.87 - 5.11 MIL/uL   Hemoglobin 13.4 12.0 - 15.0 g/dL   HCT 16.140.1 09.636.0 - 04.546.0 %   MCV 89.1 80.0 - 100.0 fL   MCH 29.8 26.0 - 34.0 pg   MCHC 33.4 30.0 - 36.0 g/dL   RDW 40.912.6 81.111.5 - 91.415.5 %   Platelets 256 150 - 400 K/uL   nRBC 0.0 0.0 - 0.2 %   ____________________________________________ ____________________________________________  RADIOLOGY  I personally reviewed all radiographic images ordered to evaluate for the above acute complaints and reviewed radiology reports and findings.  These findings were personally discussed with the patient.  Please see medical record for radiology report.  ____________________________________________   PROCEDURES  Procedure(s) performed:  Procedures    Critical Care performed:  no ____________________________________________   INITIAL IMPRESSION / ASSESSMENT AND PLAN / ED COURSE  Pertinent labs & imaging results that were available during my care of the patient were reviewed by me and considered in my medical decision making (see chart for details).   DDX: Diverticulitis, colitis, SBO, constipation, musculoskeletal strain, stone, pelvic pathology, mass  Jeannette CorpusSharon Lal is a 48 y.o. who presents to the ED with symptoms as described above.  Patient well-appearing currently in minimal discomfort.  Had reassuring transvaginal ultrasound as an outpatient.  Based on persistent pain will order CT imaging to evaluate for by differential.  Patient declined any pain medication at this time.  Will check basic blood work.  Clinical Course as of Apr 07 1657  Tue Apr 08, 2019  1654 Patient reassessed.  Repeat abdominal exam soft benign.  Work-up is reassuring.  No acute findings on CT imaging urine with small hemoglobin.  Possibly recently passed stone that she has had some stones document previous imaging revised at this point I think she is appropriate for outpatient work-up.  Discussed results and findings thus far with patient and she agrees to plan for outpatient follow-up.   [PR]    Clinical Course User Index [PR] Willy Eddyobinson, Lexany Belknap, MD    The patient was evaluated in Emergency Department today for the symptoms described in the history of present illness. He/she was evaluated in the context of the global COVID-19 pandemic, which necessitated consideration that the patient might be at risk for infection with the SARS-CoV-2 virus that causes COVID-19. Institutional protocols and algorithms that pertain to the evaluation of patients at risk for COVID-19 are in a state of rapid change based on information released by regulatory bodies including the CDC and federal and state organizations. These policies and algorithms were followed during the patient's care in the ED.  As part of my  medical decision making, I reviewed the following data within the electronic MEDICAL RECORD NUMBER Nursing notes reviewed and incorporated, Labs reviewed, notes from prior ED visits and [place] Controlled Substance Database   ____________________________________________   FINAL CLINICAL IMPRESSION(S) / ED DIAGNOSES  Final diagnoses:  Left lower quadrant abdominal pain      NEW MEDICATIONS STARTED  DURING THIS VISIT:  New Prescriptions   No medications on file     Note:  This document was prepared using Dragon voice recognition software and may include unintentional dictation errors.    Willy Eddy, MD 04/08/19 [phone]

## 2019-04-08 NOTE — ED Notes (Signed)
Pt cleared by MD to eat and drink. Pt given cracker, peanut butter, and water

## 2019-04-08 NOTE — Telephone Encounter (Signed)
Called pt, no answer, LVMTRC. 

## 2019-04-08 NOTE — Telephone Encounter (Signed)
Pt aware.

## 2019-04-15 ENCOUNTER — Ambulatory Visit
Admission: RE | Admit: 2019-04-15 | Discharge: 2019-04-15 | Disposition: A | Discharge status: Home / Self Care | Payer: BC Managed Care – PPO | Source: Ambulatory Visit | Attending: Obstetrics and Gynecology | Admitting: Obstetrics and Gynecology

## 2019-04-15 DIAGNOSIS — Z1231 Encounter for screening mammogram for malignant neoplasm of breast: Secondary | ICD-10-CM | POA: Diagnosis present

## 2019-04-15 DIAGNOSIS — Z13820 Encounter for screening for osteoporosis: Secondary | ICD-10-CM | POA: Diagnosis not present

## 2019-04-15 NOTE — Progress Notes (Signed)
PT AWARE  

## 2019-04-15 NOTE — Progress Notes (Signed)
Pls let pt know her bone density is normal and doesn't need to be done again till age 48. Thx

## 2019-04-18 ENCOUNTER — Encounter: Payer: Self-pay | Admitting: Obstetrics and Gynecology

## 2019-04-19 ENCOUNTER — Other Ambulatory Visit: Payer: Self-pay | Admitting: Family

## 2019-04-19 DIAGNOSIS — F419 Anxiety disorder, unspecified: Secondary | ICD-10-CM

## 2019-05-06 NOTE — Progress Notes (Signed)
05/07/2019 2:31 PM   [name] Oct 22, 1970 [id number]  CC: Lower abdominal pain  HPI: Cristina Wade is a 49 y.o. female with a history of rUTI's, IC and nephrolithiasis who presents today for a renal cyst found incidentally during an emergency room visit.  She presented to the ED on April 08, 2019 for the complaints of pelvic pain and flank pain.  Contrast CT noted subcentimeter low-density lesion along the posterior upper pole of the left kidney is most likely either a cyst or angiomyolipoma.  Her UA is yellow cloudy, +1 blood and trace leukocytes on dip, 6-10 WBCs, greater than 10 epithelial cells and few bacteria on microscopic exam.  PMH: Past Medical History:  Diagnosis Date  . Asthma   . Chronic pelvic pain in female 2018  . Cystitis   . Cystocele   . Endometrial polyp   . Endometriosis    per pt report but never seen during surg  . Frequent headaches   . Gastritis   . GERD (gastroesophageal reflux disease)    rare  . Gross hematuria   . History of kidney stones   . Microscopic hematuria   . Migraine   . Migraines   . Osteopenia   . Urinary disorder   . UTI (lower urinary tract infection)     Surgical History: Past Surgical History:  Procedure Laterality Date  . COLONOSCOPY N/A 11/05/2014   Procedure: COLONOSCOPY;  Surgeon: Manya Silvas, MD;  Location[facility] ENDOSCOPY;  Service: Endoscopy;  Laterality: N/A;  . COLONOSCOPY  11/2016   Dr. Vira Agar  . CYSTOSCOPY  2007   with biopsy   . CYSTOSCOPY N/A 06/01/2016   Procedure: CYSTOSCOPY;  Surgeon: Malachy Mood, MD;  Location: [facility] ORS;  Service: Gynecology;  Laterality: N/A;  . DIAGNOSTIC LAPAROSCOPY    . DILATATION & CURETTAGE/HYSTEROSCOPY WITH MYOSURE N/A 04/13/2015   Procedure: DILATATION & CURETTAGE/HYSTEROSCOPY WITH MYOSURE/POLYPECTOMY;  Surgeon: Honor Loh Ward, MD;  Location: [facility] ORS;  Service: Gynecology;  Laterality: N/A;  . DILATATION & CURETTAGE/HYSTEROSCOPY WITH MYOSURE N/A 06/01/2016   Procedure: DILATATION & CURETTAGE/HYSTEROSCOPY WITH MYOSURE;  Surgeon: Malachy Mood, MD;  Location: [facility] ORS;  Service: Gynecology;  Laterality: N/A;  . GUM SURGERY    . laproscopy  2007  . POLYPECTOMY     endometrial    Home Medications:  Allergies as of 05/07/2019      Reactions   Lac Bovis Other (See Comments)   Milk-related Compounds    Omeprazole Other (See Comments)   Pt reports "Chest pain, HA and Dysuria."   Penicillins Itching, Rash      Medication List       Accurate as of May 07, 2019  2:31 PM. If you have any questions, ask your nurse or doctor.        amitriptyline 10 MG tablet Commonly known as: ELAVIL Take 10 mg by mouth at bedtime.   CALCIUM 1200 PO Take 2-3 tablets by mouth daily. Reported on 05/26/2015   escitalopram 10 MG tablet Commonly known as: LEXAPRO Take 1 tablet by mouth once daily   levonorgestrel-ethinyl estradiol 0.15-0.03 MG tablet Commonly known as: Jolessa Take 1 tablet by mouth daily.       Allergies:  Allergies  Allergen Reactions  . Lac Bovis Other (See Comments)  . Milk-Related Compounds   . Omeprazole Other (See Comments)    Pt reports "Chest pain, HA and Dysuria."  . Penicillins Itching and Rash    Family History: Family History  Problem Relation Age  of Onset  . Colon cancer Father 67  . Arthritis Father   . Hyperlipidemia Father   . Transient ischemic attack Father   . Cancer - Colon Father 79  . Prostate cancer Father 89  . Valvular heart disease Father   . Arthritis Mother   . Heart disease Mother   . Stroke Mother        TIA  . Hypertension Mother   . Diabetes Mother   . Kidney cancer Mother 15  . Heart failure Mother   . Hyperlipidemia Mother   . Cancer Maternal Grandmother   . Cancer Paternal Grandfather        prostate  . Breast cancer Cousin 35       has contact    Social History:   reports that she has never smoked. She has never used smokeless tobacco. She reports that she does not  drink alcohol or use drugs.  ROS: UROLOGY Frequent Urination?: No Hard to postpone urination?: No Burning/pain with urination?: No Get up at night to urinate?: No Leakage of urine?: No Urine stream starts and stops?: No Trouble starting stream?: No Do you have to strain to urinate?: No Blood in urine?: No Urinary tract infection?: No Sexually transmitted disease?: No Injury to kidneys or bladder?: No Painful intercourse?: No Weak stream?: No Currently pregnant?: No Vaginal bleeding?: No Last menstrual period?: n  Gastrointestinal Nausea?: No Vomiting?: No Indigestion/heartburn?: No Diarrhea?: No Constipation?: Yes  Constitutional Fever: No Night sweats?: No Weight loss?: No Fatigue?: No  Skin Skin rash/lesions?: No Itching?: No  Eyes Blurred vision?: No Double vision?: No  Ears/Nose/Throat Sore throat?: No Sinus problems?: No  Hematologic/Lymphatic Swollen glands?: No Easy bruising?: Yes  Cardiovascular Leg swelling?: No Chest pain?: No  Respiratory Cough?: No Shortness of breath?: No  Endocrine Excessive thirst?: No  Musculoskeletal Back pain?: No Joint pain?: No  Neurological Headaches?: No Dizziness?: No  Psychologic Depression?: No Anxiety?: No  Physical Exam: BP 117/78   Pulse 71   Ht 5\' 10"  (1.778 m)   Wt 156 lb (70.8 kg)   BMI 22.38 kg/m   Constitutional:  Well nourished. Alert and oriented, No acute distress. HEENT: [place] AT, mask in place.  Trachea midline, no masses. Cardiovascular: No clubbing, cyanosis, or edema. Respiratory: Normal respiratory effort, no increased work of breathing. Neurologic: Grossly intact, no focal deficits, moving all 4 extremities. Psychiatric: Normal mood and affect.   Laboratory Data: Urinalysis Component     Latest Ref Rng & Units 05/07/2019  Specific Gravity, UA     1.005 - 1.030 1.020  pH, UA     5.0 - 7.5 8.0 (H)  Color, UA     Yellow Yellow  Appearance Ur     Clear Cloudy (A)    Leukocytes,UA     Negative Trace (A)  Protein,UA     Negative/Trace Negative  Glucose, UA     Negative Negative  Ketones, UA     Negative Negative  RBC, UA     Negative 1+ (A)  Bilirubin, UA     Negative Negative  Urobilinogen, Ur     0.2 - 1.0 mg/dL 0.2  Nitrite, UA     Negative Negative  Microscopic Examination      See below:   Component     Latest Ref Rng & Units 05/07/2019  WBC, UA     0 - 5 /hpf 6-10 (A)  RBC     0 - 2 /hpf 0-2  Epithelial Cells (  non renal)     0 - 10 /hpf >10 (A)  Crystals     N/A Present (A)  Crystal Type     N/A Amorphous Sediment  Bacteria, UA     None seen/Few Few   Results for orders placed or performed during the hospital encounter of 04/08/19  Lipase, blood  Result Value Ref Range   Lipase 41 11 - 51 U/L  Comprehensive metabolic panel  Result Value Ref Range   Sodium 136 135 - 145 mmol/L   Potassium 3.9 3.5 - 5.1 mmol/L   Chloride 105 98 - 111 mmol/L   CO2 24 22 - 32 mmol/L   Glucose, Bld 107 (H) 70 - 99 mg/dL   BUN 11 6 - 20 mg/dL   Creatinine, Ser 9.62 0.44 - 1.00 mg/dL   Calcium 9.0 8.9 - 83.6 mg/dL   Total Protein 7.1 6.5 - 8.1 g/dL   Albumin 3.9 3.5 - 5.0 g/dL   AST 16 15 - 41 U/L   ALT 12 0 - 44 U/L   Alkaline Phosphatase 32 (L) 38 - 126 U/L   Total Bilirubin 0.8 0.3 - 1.2 mg/dL   GFR calc non Af Amer >60 >60 mL/min   GFR calc Af Amer >60 >60 mL/min   Anion gap 7 5 - 15  CBC  Result Value Ref Range   WBC 8.0 4.0 - 10.5 K/uL   RBC 4.50 3.87 - 5.11 MIL/uL   Hemoglobin 13.4 12.0 - 15.0 g/dL   HCT 62.9 47.6 - 54.6 %   MCV 89.1 80.0 - 100.0 fL   MCH 29.8 26.0 - 34.0 pg   MCHC 33.4 30.0 - 36.0 g/dL   RDW 50.3 54.6 - 56.8 %   Platelets 256 150 - 400 K/uL   nRBC 0.0 0.0 - 0.2 %  Urinalysis, Complete w Microscopic  Result Value Ref Range   Color, Urine STRAW (A) YELLOW   APPearance CLEAR (A) CLEAR   Specific Gravity, Urine 1.010 1.005 - 1.030   pH 8.0 5.0 - 8.0   Glucose, UA NEGATIVE NEGATIVE mg/dL   Hgb urine  dipstick SMALL (A) NEGATIVE   Bilirubin Urine NEGATIVE NEGATIVE   Ketones, ur NEGATIVE NEGATIVE mg/dL   Protein, ur NEGATIVE NEGATIVE mg/dL   Nitrite NEGATIVE NEGATIVE   Leukocytes,Ua NEGATIVE NEGATIVE   RBC / HPF 0-5 0 - 5 RBC/hpf   WBC, UA 0-5 0 - 5 WBC/hpf   Bacteria, UA NONE SEEN NONE SEEN   Squamous Epithelial / LPF NONE SEEN 0 - 5  Pregnancy, urine  Result Value Ref Range   Preg Test, Ur NEGATIVE NEGATIVE   Pertinent imaging CLINICAL DATA:  Lower abdominal, pelvic and flank pain. Diverticulitis suspected.  EXAM: CT ABDOMEN AND PELVIS WITH CONTRAST  TECHNIQUE: Multidetector CT imaging of the abdomen and pelvis was performed using the standard protocol following bolus administration of intravenous contrast.  CONTRAST:  OMNIPAQUE IOHEXOL 300 MG/ML  SOLN  COMPARISON:  08/11/2018  FINDINGS: Lower chest: Clear lung bases.  Heart normal in size.  Hepatobiliary: No focal liver abnormality is seen. No gallstones, gallbladder wall thickening, or biliary dilatation.  Pancreas: Unremarkable. No pancreatic ductal dilatation or surrounding inflammatory changes.  Spleen: Normal in size without focal abnormality.  Adrenals/Urinary Tract: No adrenal masses.  Kidneys normal size, orientation and position with symmetric enhancement and excretion. Subcentimeter low-density lesion along the posterior upper pole of the left kidney is most likely either a cyst or angiomyolipoma. No other renal masses or  lesions. No stones. No hydronephrosis. Ureters are normal in course and in caliber. Bladder is unremarkable.  Stomach/Bowel: Stomach is within normal limits. Appendix appears normal. No evidence of bowel wall thickening, distention, or inflammatory changes.  Vascular/Lymphatic: No significant vascular findings are present. No enlarged abdominal or pelvic lymph nodes.  Reproductive: Uterus and bilateral adnexa are unremarkable.  Other: No abdominal wall  hernia or abnormality. No abdominopelvic ascites.  Musculoskeletal: No acute or significant osseous findings.  IMPRESSION: 1. No acute findings within the abdomen or pelvis. No evidence diverticulitis or other bowel inflammatory process. 2. Subcentimeter low-density lesion in the posterior upper pole the left kidney most likely a cyst. 3. No other abnormalities within the abdomen or pelvis.   Electronically Signed   By: Amie Portland M.D.   On: 04/08/2019 14:41 I have independently reviewed the films and note the ~ 75mm lesion in the left kidney  Assessment & Plan:    1. Left Renal Lesion -Too small to completely characterize on recent CT -We will have Mrs. Dlugosz return in 3 months with renal ultrasound for surveillance  Michiel Cowboy, [facility] [address] [place], [place] [address] [phone]

## 2019-05-07 ENCOUNTER — Ambulatory Visit: Payer: BC Managed Care – PPO | Admitting: Urology

## 2019-05-07 ENCOUNTER — Other Ambulatory Visit: Payer: Self-pay | Admitting: Family Medicine

## 2019-05-07 ENCOUNTER — Encounter: Payer: Self-pay | Admitting: Urology

## 2019-05-07 ENCOUNTER — Other Ambulatory Visit: Payer: Self-pay

## 2019-05-07 VITALS — BP 117/78 | HR 71 | Ht 70.0 in | Wt 156.0 lb

## 2019-05-07 DIAGNOSIS — N281 Cyst of kidney, acquired: Secondary | ICD-10-CM

## 2019-05-07 LAB — URINALYSIS, COMPLETE
Bilirubin, UA: NEGATIVE
Glucose, UA: NEGATIVE
Ketones, UA: NEGATIVE
Nitrite, UA: NEGATIVE
Protein,UA: NEGATIVE
Specific Gravity, UA: 1.02 (ref 1.005–1.030)
Urobilinogen, Ur: 0.2 mg/dL (ref 0.2–1.0)
pH, UA: 8 — ABNORMAL HIGH (ref 5.0–7.5)

## 2019-05-07 LAB — MICROSCOPIC EXAMINATION: Epithelial Cells (non renal): >10 /hpf — AB (ref 0–10)

## 2019-05-14 DIAGNOSIS — G8929 Other chronic pain: Secondary | ICD-10-CM | POA: Insufficient documentation

## 2019-05-14 DIAGNOSIS — K581 Irritable bowel syndrome with constipation: Secondary | ICD-10-CM | POA: Insufficient documentation

## 2019-05-23 NOTE — Telephone Encounter (Signed)
error 

## 2019-06-13 ENCOUNTER — Telehealth: Payer: Self-pay

## 2019-06-13 NOTE — Telephone Encounter (Signed)
Pt called and left voicemail question why her renal ultrasound has not been scheduled.   Called pt and informed her that u/s is due 3 months from her last visit which will be in April. Order is in advised pt she will be called closer to that time. Pt voiced understanding.

## 2019-07-10 ENCOUNTER — Other Ambulatory Visit: Payer: Self-pay

## 2019-07-10 ENCOUNTER — Encounter: Payer: Self-pay | Admitting: Emergency Medicine

## 2019-07-10 ENCOUNTER — Emergency Department
Admission: EM | Admit: 2019-07-10 | Discharge: 2019-07-10 | Disposition: A | Discharge status: Home / Self Care | Payer: BC Managed Care – PPO | Attending: Student in an Organized Health Care Education/Training Program | Admitting: Student in an Organized Health Care Education/Training Program

## 2019-07-10 DIAGNOSIS — R519 Headache, unspecified: Secondary | ICD-10-CM | POA: Diagnosis present

## 2019-07-10 DIAGNOSIS — J45909 Unspecified asthma, uncomplicated: Secondary | ICD-10-CM | POA: Diagnosis not present

## 2019-07-10 DIAGNOSIS — Z79899 Other long term (current) drug therapy: Secondary | ICD-10-CM | POA: Insufficient documentation

## 2019-07-10 DIAGNOSIS — J0110 Acute frontal sinusitis, unspecified: Secondary | ICD-10-CM | POA: Insufficient documentation

## 2019-07-10 MED ORDER — EXCEDRIN MIGRAINE 250-250-65 MG PO TABS: 1.0000 | ORAL_TABLET | Freq: Four times a day (QID) | ORAL | 0 refills | Status: DC | PRN

## 2019-07-10 NOTE — ED Triage Notes (Signed)
Pt presents to ER from home reports been having headache for 2 weeks points to sinus area, reports went to ENT yesterday and MD told her it is due to sinus, reports she called her pharmacist and her pharmacist told her it could be meningitis. Pt denies any photosensitivity, denies any other symptom reports headache comes and goes. Pt talks in complete sentences no distress noted.

## 2019-07-10 NOTE — ED Provider Notes (Signed)
[facility] Emergency Department Provider Note  ____________________________________________  Time seen: Approximately 9:21 PM  I have reviewed the triage vital signs and the nursing notes.   HISTORY  Chief Complaint Headache    HPI Cristina Wade is a 49 y.o. female who presents the emergency department complaining of headache, sinus pressure, intermittent neck pain.  Patient states that she has been dealing with the symptoms for several weeks.  She has been followed by her ENT.  Initially she was placed on allergy medications and when these were not alleviating her symptoms she was placed on antibiotics yesterday.  She states that when she was at the pharmacy picking up her antibiotic, she told her symptoms to the pharmacist who advised her that she could have meningitis and should be evaluated for same.  Patient has no fevers or chills, no nuchal rigidity, no URI symptoms other than nasal congestion and sinus pressure.  No cough.  No chest pain.  No recent trauma.  No other complaints.   No weakness on one side of the body or the other.  No difficulty formulating thoughts or words.        Past Medical History:  Diagnosis Date  . Asthma   . Chronic pelvic pain in female 2018  . Cystitis   . Cystocele   . Endometrial polyp   . Endometriosis    per pt report but never seen during surg  . Frequent headaches   . Gastritis   . GERD (gastroesophageal reflux disease)    rare  . Gross hematuria   . History of kidney stones   . Microscopic hematuria   . Migraine   . Migraines   . Osteopenia   . Urinary disorder   . UTI (lower urinary tract infection)     Patient Active Problem List   Diagnosis Date Noted  . Urinary retention 08/12/2018  . Anxiety 08/12/2018  . Elevated blood pressure reading without diagnosis of hypertension 07/12/2018  . Left leg pain 07/12/2018  . Acute left-sided low back pain 06/03/2018  . Cloudy urine 04/29/2018  . Chest pain  04/29/2018  . Frontal headache 04/27/2018  . Localized, primary osteoarthritis 02/07/2018  . Pelvic pain 01/30/2018  . Slow transit constipation 10/10/2017  . Bilateral impacted cerumen 11/28/2016  . Chronic bilateral low back pain without sciatica 11/28/2016  . Precordial pain 07/07/2016  . Right bundle branch block (RBBB) on electrocardiogram (ECG) 07/07/2016  . Chest tightness 06/27/2016  . Preventative health care 09/30/2015  . Encounter to establish care 09/28/2014  . Vestibular migraine 09/28/2014  . Encounter for screening colonoscopy 11/21/2013    Past Surgical History:  Procedure Laterality Date  . COLONOSCOPY N/A 11/05/2014   Procedure: COLONOSCOPY;  Surgeon: Scot Jun, MD;  [facility] ENDOSCOPY;  Service: Endoscopy;  Laterality: N/A;  . COLONOSCOPY  11/2016   Dr. Mechele Collin  . CYSTOSCOPY  2007   with biopsy   . CYSTOSCOPY N/A 06/01/2016   Procedure: CYSTOSCOPY;  Surgeon: Vena Austria, MD;  Location: [facility] ORS;  Service: Gynecology;  Laterality: N/A;  . DIAGNOSTIC LAPAROSCOPY    . DILATATION & CURETTAGE/HYSTEROSCOPY WITH MYOSURE N/A 04/13/2015   Procedure: DILATATION & CURETTAGE/HYSTEROSCOPY WITH MYOSURE/POLYPECTOMY;  Surgeon: Elenora Fender Ward, MD;  Location: [facility] ORS;  Service: Gynecology;  Laterality: N/A;  . DILATATION & CURETTAGE/HYSTEROSCOPY WITH MYOSURE N/A 06/01/2016   Procedure: DILATATION & CURETTAGE/HYSTEROSCOPY WITH MYOSURE;  Surgeon: Vena Austria, MD;  Location: [facility] ORS;  Service: Gynecology;  Laterality: N/A;  . GUM SURGERY    .  laproscopy  2007  . POLYPECTOMY     endometrial    Prior to Admission medications   Medication Sig Start Date End Date Taking? Authorizing Provider  amitriptyline (ELAVIL) 10 MG tablet Take 10 mg by mouth at bedtime. 04/21/19   [provider]  aspirin-acetaminophen-caffeine (EXCEDRIN MIGRAINE) [id number] MG tablet Take 1 tablet by mouth every 6 (six) hours as needed for headache. 07/10/19   Jaylee Lantry, Delorise Royals, PA-C  Calcium Carbonate-Vit D-Min (CALCIUM 1200 PO) Take 2-3 tablets by mouth daily. Reported on 05/26/2015    [provider]  escitalopram (LEXAPRO) 10 MG tablet Take 1 tablet by mouth once daily 04/21/19   [name], FNP  levonorgestrel-ethinyl estradiol (JOLESSA) 0.15-0.03 MG tablet Take 1 tablet by mouth daily. 03/13/18   Copland, Alicia B, PA-C    Allergies Lac bovis, Milk-related compounds, Omeprazole, and Penicillins  Family History  Problem Relation Age of Onset  . Colon cancer Father 24  . Arthritis Father   . Hyperlipidemia Father   . Transient ischemic attack Father   . Cancer - Colon Father 76  . Prostate cancer Father 67  . Valvular heart disease Father   . Arthritis Mother   . Heart disease Mother   . Stroke Mother        TIA  . Hypertension Mother   . Diabetes Mother   . Kidney cancer Mother 26  . Heart failure Mother   . Hyperlipidemia Mother   . Cancer Maternal Grandmother   . Cancer Paternal Grandfather        prostate  . Breast cancer Cousin 35       has contact    Social History Social History   Tobacco Use  . Smoking status: Never Smoker  . Smokeless tobacco: Never Used  Substance Use Topics  . Alcohol use: No  . Drug use: No     Review of Systems  Constitutional: No fever/chills Eyes: No visual changes. No discharge ENT: Sinus congestion with sinus pressure being treated for sinusitis. Cardiovascular: no chest pain. Respiratory: no cough. No SOB. Gastrointestinal: No abdominal pain.  No nausea, no vomiting.   Musculoskeletal: Intermittent neck pain.  No loss of range of motion Skin: Negative for rash, abrasions, lacerations, ecchymosis. Neurological: Positive for headache.  Denies focal weakness or numbness. 10-point ROS otherwise negative.  ____________________________________________   PHYSICAL EXAM:  VITAL SIGNS: ED Triage Vitals  Enc Vitals Group     BP 07/10/19 1923 (!) 146/96     Pulse Rate 07/10/19 1923  75     Resp 07/10/19 1923 17     Temp 07/10/19 1923 98.4 F (36.9 C)     Temp Source 07/10/19 1923 Oral     SpO2 07/10/19 1923 100 %     Weight 07/10/19 1924 160 lb (72.6 kg)     Height 07/10/19 1924 5\' 10"  (1.778 m)     Head Circumference --      Peak Flow --      Pain Score 07/10/19 1936 7     Pain Loc --      Pain Edu? --      Excl. in GC? --      Constitutional: Alert and oriented. Well appearing and in no acute distress. Eyes: Conjunctivae are normal. PERRL. EOMI. Head: Atraumatic. ENT:      Ears:       Nose: Mild clear congestion/rhinnorhea.  Patient tender to percussion over the frontal and ethmoid sinuses.  Mouth/Throat: Mucous membranes are moist.  Oropharynx is nonerythematous and nonedematous. Neck: No stridor.  No cervical spine tenderness to palpation.  Full range of motion to the spine.  Negative Kernig's and Brudzinski's. Hematological/Lymphatic/Immunilogical: No cervical lymphadenopathy. Cardiovascular: Normal rate, regular rhythm. Normal S1 and S2.  Good peripheral circulation. Respiratory: Normal respiratory effort without tachypnea or retractions. Lungs CTAB. Good air entry to the bases with no decreased or absent breath sounds. Musculoskeletal: Full range of motion to all extremities. No gross deformities appreciated. Neurologic:  Normal speech and language. No gross focal neurologic deficits are appreciated.  Cranial nerves II through XII grossly intact.  Negative Romberg's and pronator drift. Skin:  Skin is warm, dry and intact. No rash noted. Psychiatric: Mood and affect are normal. Speech and behavior are normal. Patient exhibits appropriate insight and judgement.   ____________________________________________   LABS (all labs ordered are listed, but only abnormal results are displayed)  Labs Reviewed - No data to display ____________________________________________  EKG   ____________________________________________  RADIOLOGY   No  results found.  ____________________________________________    PROCEDURES  Procedure(s) performed:    Procedures    Medications - No data to display   ____________________________________________   INITIAL IMPRESSION / ASSESSMENT AND PLAN / ED COURSE  Pertinent labs & imaging results that were available during my care of the patient were reviewed by me and considered in my medical decision making (see chart for details).  Review of the [place] CSRS was performed in accordance of the [place] prior to dispensing any controlled drugs.           Patient's diagnosis is consistent with sinusitis with sinus headache.  Patient presented to the emergency department complaining of frontal headache, some neck pain.  Patient states that she has been treated for sinusitis by her ENT.  Patient started antibiotics that was prescribed from the visit yesterday.  When picking up her prescription, she mentions her symptoms to the pharmacist who advised her that he believed that she could have meningitis and referred her to the emergency department.  Patient then googled the symptoms, was concerned as she did have headache, was being treated for current infection and did have some neck pain.  The symptoms have been persistent for several weeks, findings on physical exam are most consistent with sinusitis with sinus headache.  Patient had a reassuring neuro exam.  No nuchal rigidity.  Negative Kernig's and Brudzinski.  Patient does also have a history of migraine but symptoms are not consistent with a migraine at this time.  I discussed differential with the patient.  After a reassuring exam, discussing findings with the patient patient opts for medication for her headache and will continue to follow-up with her ENT provider.  No labs or imaging at this time..  Patient is given ED precautions to return to the ED for any worsening or new symptoms.     ____________________________________________  FINAL  CLINICAL IMPRESSION(S) / ED DIAGNOSES  Final diagnoses:  Acute frontal sinusitis, recurrence not specified  Sinus headache      NEW MEDICATIONS STARTED DURING THIS VISIT:  ED Discharge Orders         Ordered    aspirin-acetaminophen-caffeine (EXCEDRIN MIGRAINE) 250-250-65 MG tablet  Every 6 hours PRN     07/10/19 2128              This chart was dictated using voice recognition software/Dragon. Despite best efforts to proofread, errors can occur which can change the meaning. Any change was  purely unintentional.    Lanette Hampshire 07/10/19 2132    Willy Eddy, MD 07/10/19 [phone]

## 2019-07-14 ENCOUNTER — Other Ambulatory Visit: Payer: Self-pay

## 2019-07-14 ENCOUNTER — Emergency Department
Admission: EM | Admit: 2019-07-14 | Discharge: 2019-07-14 | Disposition: A | Discharge status: Home / Self Care | Payer: BC Managed Care – PPO | Attending: Emergency Medicine | Admitting: Emergency Medicine

## 2019-07-14 ENCOUNTER — Emergency Department: Payer: BC Managed Care – PPO

## 2019-07-14 ENCOUNTER — Encounter: Payer: Self-pay | Admitting: Emergency Medicine

## 2019-07-14 DIAGNOSIS — R519 Headache, unspecified: Secondary | ICD-10-CM

## 2019-07-14 DIAGNOSIS — R0789 Other chest pain: Secondary | ICD-10-CM | POA: Diagnosis not present

## 2019-07-14 DIAGNOSIS — J45909 Unspecified asthma, uncomplicated: Secondary | ICD-10-CM | POA: Insufficient documentation

## 2019-07-14 DIAGNOSIS — Z20822 Contact with and (suspected) exposure to covid-19: Secondary | ICD-10-CM | POA: Diagnosis not present

## 2019-07-14 LAB — BASIC METABOLIC PANEL
Anion gap: 8 (ref 5–15)
BUN: 15 mg/dL (ref 6–20)
CO2: 28 mmol/L (ref 22–32)
Calcium: 9.6 mg/dL (ref 8.9–10.3)
Chloride: 106 mmol/L (ref 98–111)
Creatinine, Ser: 0.73 mg/dL (ref 0.44–1.00)
GFR calc Af Amer: >60 mL/min (ref >60)
GFR calc non Af Amer: >60 mL/min (ref >60)
Glucose, Bld: 98 mg/dL (ref 70–99)
Potassium: 4.2 mmol/L (ref 3.5–5.1)
Sodium: 142 mmol/L (ref 135–145)

## 2019-07-14 LAB — CBC
HCT: 39.8 % (ref 36.0–46.0)
Hemoglobin: 13.2 g/dL (ref 12.0–15.0)
MCH: 30 pg (ref 26.0–34.0)
MCHC: 33.2 g/dL (ref 30.0–36.0)
MCV: 90.5 fL (ref 80.0–100.0)
Platelets: 225 10*3/uL (ref 150–400)
RBC: 4.4 MIL/uL (ref 3.87–5.11)
RDW: 12.1 % (ref 11.5–15.5)
WBC: 5.6 10*3/uL (ref 4.0–10.5)
nRBC: 0 % (ref 0.0–0.2)

## 2019-07-14 LAB — SARS CORONAVIRUS 2 (TAT 6-24 HRS): SARS Coronavirus 2: NEGATIVE

## 2019-07-14 LAB — TROPONIN I (HIGH SENSITIVITY): Troponin I (High Sensitivity): 3 ng/L (ref <18)

## 2019-07-14 NOTE — ED Notes (Signed)
See triage note  Presents with h/a for about 3 weeks   She has seen ENT for same   Min relief  Also having some discomfort in chest for the past 3-4 days

## 2019-07-14 NOTE — ED Triage Notes (Signed)
Pt c/o headaches intermittent for 2 weeks and intermittent chest pain X 1 week.  Ambulatory without difficulty.  VSS. Started nasal spray by PCP.  No congestion.  Headache is to frontal area and occipital. Denies recent stressors.  Color WNL.  Unlabored.

## 2019-07-14 NOTE — ED Provider Notes (Signed)
[facility] Emergency Department Provider Note   ____________________________________________   First MD Initiated Contact with Patient 07/14/19 1710     (approximate)  I have reviewed the triage vital signs and the nursing notes.   HISTORY  Chief Complaint Chest Pain and Headache    HPI Cristina Wade is a 49 y.o. female here for evaluation of a headache  Patient reports that she started about 2 weeks ago having a headache it is frontal in nature, associated with some sinus drainage.  She saw her ENT physician, placed her on nasal spray, that did not help so then about a week after that she started antibiotic  She discontinues her antibiotic.  She reports her sinuses are feeling improved.  She continues to have intermittent headache bifrontal in nature throbbing  Not worsened by light or sound.  Is located around her nose and just above her nose and across the front of her forehead  She had a little bit of chest discomfort about 3 to 4 days ago that is gone away  She also has had a little bit of an intermittent heart describe chest discomfort for about a week, its sharp, nonradiating, does not feel like a pressure.  No shortness of breath.  Congestion is gone away  She did have an exposure to someone who was diagnosed with Covid about 1 week ago.  It was a casual exposure via [employer], but she was exposed   Past Medical History:  Diagnosis Date  . Asthma   . Chronic pelvic pain in female 2018  . Cystitis   . Cystocele   . Endometrial polyp   . Endometriosis    per pt report but never seen during surg  . Frequent headaches   . Gastritis   . GERD (gastroesophageal reflux disease)    rare  . Gross hematuria   . History of kidney stones   . Microscopic hematuria   . Migraine   . Migraines   . Osteopenia   . Urinary disorder   . UTI (lower urinary tract infection)     Patient Active Problem List   Diagnosis Date Noted  . Urinary  retention 08/12/2018  . Anxiety 08/12/2018  . Elevated blood pressure reading without diagnosis of hypertension 07/12/2018  . Left leg pain 07/12/2018  . Acute left-sided low back pain 06/03/2018  . Cloudy urine 04/29/2018  . Chest pain 04/29/2018  . Frontal headache 04/27/2018  . Localized, primary osteoarthritis 02/07/2018  . Pelvic pain 01/30/2018  . Slow transit constipation 10/10/2017  . Bilateral impacted cerumen 11/28/2016  . Chronic bilateral low back pain without sciatica 11/28/2016  . Precordial pain 07/07/2016  . Right bundle branch block (RBBB) on electrocardiogram (ECG) 07/07/2016  . Chest tightness 06/27/2016  . Preventative health care 09/30/2015  . Encounter to establish care 09/28/2014  . Vestibular migraine 09/28/2014  . Encounter for screening colonoscopy 11/21/2013    Past Surgical History:  Procedure Laterality Date  . COLONOSCOPY N/A 11/05/2014   Procedure: COLONOSCOPY;  Surgeon: Manya Silvas, MD;  Location: [facility] ENDOSCOPY;  Service: Endoscopy;  Laterality: N/A;  . COLONOSCOPY  11/2016   Dr. Vira Agar  . CYSTOSCOPY  2007   with biopsy   . CYSTOSCOPY N/A 06/01/2016   Procedure: CYSTOSCOPY;  Surgeon: Malachy Mood, MD;  Location: [facility] ORS;  Service: Gynecology;  Laterality: N/A;  . DIAGNOSTIC LAPAROSCOPY    . DILATATION & CURETTAGE/HYSTEROSCOPY WITH MYOSURE N/A 04/13/2015   Procedure: DILATATION & CURETTAGE/HYSTEROSCOPY WITH MYOSURE/POLYPECTOMY;  Surgeon: Elenora Fender Ward, MD;  Location: [facility] ORS;  Service: Gynecology;  Laterality: N/A;  . DILATATION & CURETTAGE/HYSTEROSCOPY WITH MYOSURE N/A 06/01/2016   Procedure: DILATATION & CURETTAGE/HYSTEROSCOPY WITH MYOSURE;  Surgeon: Vena Austria, MD;  Location: [facility] ORS;  Service: Gynecology;  Laterality: N/A;  . GUM SURGERY    . laproscopy  2007  . POLYPECTOMY     endometrial    Prior to Admission medications   Medication Sig Start Date End Date Taking? Authorizing Provider  amitriptyline (ELAVIL) 10 MG  tablet Take 10 mg by mouth at bedtime. 04/21/19   [provider]  aspirin-acetaminophen-caffeine (EXCEDRIN MIGRAINE) [phone] MG tablet Take 1 tablet by mouth every 6 (six) hours as needed for headache. 07/10/19   Cuthriell, Delorise Royals, PA-C  Calcium Carbonate-Vit D-Min (CALCIUM 1200 PO) Take 2-3 tablets by mouth daily. Reported on 05/26/2015    [provider]  escitalopram (LEXAPRO) 10 MG tablet Take 1 tablet by mouth once daily 04/21/19   [name], FNP  levonorgestrel-ethinyl estradiol (JOLESSA) 0.15-0.03 MG tablet Take 1 tablet by mouth daily. 03/13/18   Copland, Alicia B, PA-C    Allergies Lac bovis, Milk-related compounds, Omeprazole, and Penicillins  Family History  Problem Relation Age of Onset  . Colon cancer Father 12  . Arthritis Father   . Hyperlipidemia Father   . Transient ischemic attack Father   . Cancer - Colon Father 58  . Prostate cancer Father 91  . Valvular heart disease Father   . Arthritis Mother   . Heart disease Mother   . Stroke Mother        TIA  . Hypertension Mother   . Diabetes Mother   . Kidney cancer Mother 54  . Heart failure Mother   . Hyperlipidemia Mother   . Cancer Maternal Grandmother   . Cancer Paternal Grandfather        prostate  . Breast cancer Cousin 35       has contact    Social History Social History   Tobacco Use  . Smoking status: Never Smoker  . Smokeless tobacco: Never Used  Substance Use Topics  . Alcohol use: No  . Drug use: No    Review of Systems Constitutional: No fever/chills Eyes: No visual changes. ENT: No sore throat. Cardiovascular: See HPI Respiratory: Denies shortness of breath. Gastrointestinal: No abdominal pain.     Musculoskeletal: Negative for back pain. Skin: Negative for rash. Neurological: Negative for areas of focal weakness or numbness.  Patient reports her headaches actually quite a bit better right now notes very mild.  Does not wish for any medication for  it   ____________________________________________   PHYSICAL EXAM:  VITAL SIGNS: ED Triage Vitals  Enc Vitals Group     BP 07/14/19 1559 (!) 142/85     Pulse Rate 07/14/19 1559 66     Resp 07/14/19 1559 16     Temp 07/14/19 1559 97.7 F (36.5 C)     Temp Source 07/14/19 1559 Oral     SpO2 07/14/19 1559 100 %     Weight 07/14/19 1601 160 lb (72.6 kg)     Height 07/14/19 1601 5\' 10"  (1.778 m)     Head Circumference --      Peak Flow --      Pain Score 07/14/19 1600 10     Pain Loc --      Pain Edu? --      Excl. in GC? --     Constitutional:  Alert and oriented. Well appearing and in no acute distress. Eyes: Conjunctivae are normal. Head: Atraumatic. Nose: No congestion/rhinnorhea.  No noted nasal congestion. Mouth/Throat: Mucous membranes are moist. Neck: No stridor.  Cardiovascular: Normal rate, regular rhythm. Grossly normal heart sounds.  Good peripheral circulation. Respiratory: Normal respiratory effort.  No retractions. Lungs CTAB. Gastrointestinal: Soft and nontender. No distention. Musculoskeletal: No lower extremity tenderness nor edema. Neurologic:  Normal speech and language. No gross focal neurologic deficits are appreciated.  Skin:  Skin is warm, dry and intact. No rash noted. Psychiatric: Mood and affect are normal. Speech and behavior are normal.  ____________________________________________   LABS (all labs ordered are listed, but only abnormal results are displayed)  Labs Reviewed  SARS CORONAVIRUS 2 (TAT 6-24 HRS)  BASIC METABOLIC PANEL  CBC  TROPONIN I (HIGH SENSITIVITY)  TROPONIN I (HIGH SENSITIVITY)   ____________________________________________  EKG  Reviewed interpreted by me at 1400 Heart rate 65 QRs 99 QTc 440 Normal sinus rhythm, no evidence acute ischemia or ectopy ____________________________________________  RADIOLOGY  DG Chest 2 View  Result Date: 07/14/2019 CLINICAL DATA:  Intermittent headaches for 2 weeks and  intermittent chest pain for 1 week. EXAM: CHEST - 2 VIEW COMPARISON:  Single-view of the chest 08/14/2018. FINDINGS: The lungs are clear. Heart size is normal. No pneumothorax or pleural effusion. No acute or focal bony abnormality. IMPRESSION: No acute disease. Electronically Signed   By: Drusilla Kanner M.D.   On: 07/14/2019 16:33   CT Head Wo Contrast  Result Date: 07/14/2019 CLINICAL DATA:  Headache EXAM: CT HEAD WITHOUT CONTRAST CT MAXILLOFACIAL WITHOUT CONTRAST TECHNIQUE: Multidetector CT imaging of the head and maxillofacial structures were performed using the standard protocol without intravenous contrast. Multiplanar CT image reconstructions of the maxillofacial structures were also generated. COMPARISON:  MRI 06/13/2018 FINDINGS: CT HEAD FINDINGS Brain: No acute territorial infarction, hemorrhage, or intracranial mass. The ventricles are non enlarged. Mild anterior atrophy. Vascular: No hyperdense vessels.  No unexpected calcification. Skull: Normal. Negative for fracture or focal lesion. Other: None CT MAXILLOFACIAL FINDINGS Osseous: Inferior mandible is incompletely included. Nasal bones are intact. Zygomatic arches are intact. Pterygoid plates show no fracture. Nasal septum appears midline. Orbits: Negative. No traumatic or inflammatory finding. Sinuses: Minimal mucosal thickening in the right sphenoid sinus. No fluid levels. Ostiomeatal complexes are patent. Soft tissues: Negative.  Nasal turbinates are unremarkable IMPRESSION: 1. Negative non contrasted CT appearance of the brain 2. Minimal mucosal thickening in the right sphenoid sinus. Paranasal sinuses are otherwise clear. Electronically Signed   By: Jasmine Pang M.D.   On: 07/14/2019 18:21   CT Maxillofacial Wo Contrast  Result Date: 07/14/2019 CLINICAL DATA:  Headache EXAM: CT HEAD WITHOUT CONTRAST CT MAXILLOFACIAL WITHOUT CONTRAST TECHNIQUE: Multidetector CT imaging of the head and maxillofacial structures were performed using the  standard protocol without intravenous contrast. Multiplanar CT image reconstructions of the maxillofacial structures were also generated. COMPARISON:  MRI 06/13/2018 FINDINGS: CT HEAD FINDINGS Brain: No acute territorial infarction, hemorrhage, or intracranial mass. The ventricles are non enlarged. Mild anterior atrophy. Vascular: No hyperdense vessels.  No unexpected calcification. Skull: Normal. Negative for fracture or focal lesion. Other: None CT MAXILLOFACIAL FINDINGS Osseous: Inferior mandible is incompletely included. Nasal bones are intact. Zygomatic arches are intact. Pterygoid plates show no fracture. Nasal septum appears midline. Orbits: Negative. No traumatic or inflammatory finding. Sinuses: Minimal mucosal thickening in the right sphenoid sinus. No fluid levels. Ostiomeatal complexes are patent. Soft tissues: Negative.  Nasal turbinates are unremarkable IMPRESSION: 1. Negative  non contrasted CT appearance of the brain 2. Minimal mucosal thickening in the right sphenoid sinus. Paranasal sinuses are otherwise clear. Electronically Signed   By: Jasmine Pang M.D.   On: 07/14/2019 18:21    Chest x-ray viewed negative for acute  CT head was minimal mucosal thickening right sphenoid ____________________________________________   PROCEDURES  Procedure(s) performed: None  Procedures  Critical Care performed: No  ____________________________________________   INITIAL IMPRESSION / ASSESSMENT AND PLAN / ED COURSE  Pertinent labs & imaging results that were available during my care of the patient were reviewed by me and considered in my medical decision making (see chart for details).   Patient comes for evaluation of headache.  Is been present off and on for about 2 weeks.  Reassuring neurologic exam.  Reports headaches been intermittent, present for 2 weeks.  Been treated with antibiotics for possible sinusitis.  Did however have a possible Covid exposure about her little over a week  ago  We will check for COVID-19, overall I suspect low risk overall.  We will obtain a CT of the head discussed risks and benefits, patient reports she would like to have one done as this headaches been present for about 2 weeks and she was told that she may need 1 by her ear nose and throat doctor  Very reassuring that her headache is almost resolved, she does not wish for any medications for relief of headache or symptoms at this time.  Atypical chest pain, reassuring EKG.  No evidence of acute ischemia that    ----------------------------------------- 6:37 PM on 07/14/2019 -----------------------------------------  Reviewed imaging results with patient.  Discussed plan of care and she is comfortable with plan for outpatient follow-up.  Understands she has a pending coronavirus test and will quarantine until result returns, further if positive  Patient in no acute distress. Return precautions and treatment recommendations and follow-up discussed with the patient who is agreeable with the plan.   ____________________________________________   FINAL CLINICAL IMPRESSION(S) / ED DIAGNOSES  Final diagnoses:  Sinus headache  Atypical chest pain  Contact with and (suspected) exposure to covid-19        Note:  This document was prepared using Dragon voice recognition software and may include unintentional dictation errors       Sharyn Creamer, MD 07/14/19 [place]

## 2019-07-18 ENCOUNTER — Other Ambulatory Visit: Payer: Self-pay

## 2019-07-18 ENCOUNTER — Ambulatory Visit
Admission: RE | Admit: 2019-07-18 | Discharge: 2019-07-18 | Disposition: A | Discharge status: Home / Self Care | Payer: BC Managed Care – PPO | Source: Ambulatory Visit | Attending: Urology | Admitting: Urology

## 2019-07-18 DIAGNOSIS — N281 Cyst of kidney, acquired: Secondary | ICD-10-CM | POA: Diagnosis present

## 2019-07-22 NOTE — Progress Notes (Signed)
07/23/19 10:01 AM   Cristina Wade [birth year] [id number]  Referring provider: [name], FNP [address] [place],  [place] [address]  Chief Complaint  Patient presents with  . Results    u/s results    HPI: Cristina Wade is a 49 y.o. female with a history of rUTI's, IC, dysfunction boiding and nephrolithiasis who returns today for the evaluation and management of renal cyst.   She presented to the ED on April 08, 2019 for the complaints of pelvic pain and flank pain.  Contrast CT noted subcentimeter low-density lesion along the posterior upper pole of the left kidney is most likely either a cyst or angiomyolipoma.  Urinalysis from 05/07/19 indicated 6-10 WBC, >10 epithelial cells, and few bacteria.   RUS 07/18/19 indicated no renal lesions corresponding to finding in the superior pole of the left kidney seen on prior CT.    She reports of back pain and does not exercise as much as she wants to. She reports of drinking plenty of water.   No bothersome urinary symptoms. Denies burning with urination and incomplete bladder emptying.   She is currently taking Senna x4 per day. She denies use of stool softener.   PMH: Past Medical History:  Diagnosis Date  . Asthma   . Chronic pelvic pain in female 2018  . Cystitis   . Cystocele   . Endometrial polyp   . Endometriosis    per pt report but never seen during surg  . Frequent headaches   . Gastritis   . GERD (gastroesophageal reflux disease)    rare  . Gross hematuria   . History of kidney stones   . Microscopic hematuria   . Migraine   . Migraines   . Osteopenia   . Urinary disorder   . UTI (lower urinary tract infection)     Surgical History: Past Surgical History:  Procedure Laterality Date  . COLONOSCOPY N/A 11/05/2014   Procedure: COLONOSCOPY;  Surgeon: Scot Jun, MD;  Location: [facility] ENDOSCOPY;  Service: Endoscopy;  Laterality: N/A;  . COLONOSCOPY  11/2016   Dr. Mechele Collin  . CYSTOSCOPY   2007   with biopsy   . CYSTOSCOPY N/A 06/01/2016   Procedure: CYSTOSCOPY;  Surgeon: Vena Austria, MD;  Location: [facility] ORS;  Service: Gynecology;  Laterality: N/A;  . DIAGNOSTIC LAPAROSCOPY    . DILATATION & CURETTAGE/HYSTEROSCOPY WITH MYOSURE N/A 04/13/2015   Procedure: DILATATION & CURETTAGE/HYSTEROSCOPY WITH MYOSURE/POLYPECTOMY;  Surgeon: Elenora Fender Ward, MD;  Location: [facility] ORS;  Service: Gynecology;  Laterality: N/A;  . DILATATION & CURETTAGE/HYSTEROSCOPY WITH MYOSURE N/A 06/01/2016   Procedure: DILATATION & CURETTAGE/HYSTEROSCOPY WITH MYOSURE;  Surgeon: Vena Austria, MD;  Location: [facility] ORS;  Service: Gynecology;  Laterality: N/A;  . GUM SURGERY    . laproscopy  2007  . POLYPECTOMY     endometrial    Home Medications:  Allergies as of 07/23/2019      Reactions   Lac Bovis Other (See Comments)   Milk-related Compounds    Omeprazole Other (See Comments)   Pt reports "Chest pain, HA and Dysuria."   Penicillins Itching, Rash      Medication List       Accurate as of July 23, 2019 11:59 PM. If you have any questions, ask your nurse or doctor.        amitriptyline 10 MG tablet Commonly known as: ELAVIL Take 10 mg by mouth at bedtime.   Azelastine HCl 137 MCG/SPRAY Soln SMARTSIG:2 Spray(s) Both Nares  Twice Daily PRN   CALCIUM 1200 PO Take 2-3 tablets by mouth daily. Reported on 05/26/2015   escitalopram 10 MG tablet Commonly known as: LEXAPRO Take 1 tablet by mouth once daily   Excedrin Migraine 250-250-65 MG tablet Generic drug: aspirin-acetaminophen-caffeine Take 1 tablet by mouth every 6 (six) hours as needed for headache.   levonorgestrel-ethinyl estradiol 0.15-0.03 MG tablet Commonly known as: Jolessa Take 1 tablet by mouth daily.   omeprazole 40 MG capsule Commonly known as: PRILOSEC Take 40 mg by mouth daily.       Allergies:  Allergies  Allergen Reactions  . Lac Bovis Other (See Comments)  . Milk-Related Compounds   . Omeprazole Other (See  Comments)    Pt reports "Chest pain, HA and Dysuria."  . Penicillins Itching and Rash    Family History: Family History  Problem Relation Age of Onset  . Colon cancer Father 98  . Arthritis Father   . Hyperlipidemia Father   . Transient ischemic attack Father   . Cancer - Colon Father 70  . Prostate cancer Father 69  . Valvular heart disease Father   . Arthritis Mother   . Heart disease Mother   . Stroke Mother        TIA  . Hypertension Mother   . Diabetes Mother   . Kidney cancer Mother 64  . Heart failure Mother   . Hyperlipidemia Mother   . Cancer Maternal Grandmother   . Cancer Paternal Grandfather        prostate  . Breast cancer Cousin 70       has contact    Social History:  reports that she has never smoked. She has never used smokeless tobacco. She reports that she does not drink alcohol or use drugs.   Physical Exam: BP 127/74   Pulse 90   Ht 5\' 10"  (1.778 m)   Wt 156 lb (70.8 kg)   BMI 22.38 kg/m   Constitutional:  Alert and oriented, No acute distress. HEENT: [place] AT, moist mucus membranes.  Trachea midline, no masses. Cardiovascular: No clubbing, cyanosis, or edema. Respiratory: Normal respiratory effort, no increased work of breathing. Skin: No rashes, bruises or suspicious lesions. Neurologic: Grossly intact, no focal deficits, moving all 4 extremities. Psychiatric: Normal mood and affect.  Laboratory Data:  Lab Results  Component Value Date   CREATININE 0.73 07/14/2019   Pertinent Imaging:  Results for orders placed during the hospital encounter of 07/18/19  US RENAL   Narrative CLINICAL DATA:  Renal cyst seen on CT  EXAM: RENAL / URINARY TRACT ULTRASOUND COMPLETE  COMPARISON:  None.  FINDINGS: Right Kidney:  Renal measurements: 10.9 x 5.6 x 4.5 cm = volume: 143 mL . Echogenicity within normal limits. No mass or hydronephrosis visualized.  Left Kidney:  Renal measurements: 11.5 x 5.5 x 4.9 cm = volume: 160 mL. Echogenicity  within normal limits. No mass or hydronephrosis visualized.  Bladder:  Appears normal for degree of bladder distention.  Other:  None.  IMPRESSION: No renal lesions appreciated by ultrasound to correspond to finding in the superior pole of the left kidney seen on prior CT.   Electronically Signed   By: Eddie Candle M.D.   On: 07/18/2019 14:19     I have personally reviewed the images and agree with radiologist interpretation.   Assessment & Plan:    1. Renal lesion   Not appreciated on RUS Most likely small benign cyst  Would not recommend further imaging at this  time  Repeat RUS in 2 years with Carollee Herter PA-C   2. Chronic constipation  Continue current bowel regimen  Continue using Senna x4 per day  Unchanged    Return in about 2 years (around 07/22/2021) for with shannon for RUS.  [facility] [address] [place], [place] [address] [phone]  I, Donne Hazel, am acting as a scribe for Dr. Vanna Scotland,  I have reviewed the above documentation for accuracy and completeness, and I agree with the above.    Vanna Scotland, MD

## 2019-07-23 ENCOUNTER — Other Ambulatory Visit: Payer: Self-pay

## 2019-07-23 ENCOUNTER — Encounter: Payer: Self-pay | Admitting: Urology

## 2019-07-23 ENCOUNTER — Ambulatory Visit: Payer: BC Managed Care – PPO | Admitting: Urology

## 2019-07-23 VITALS — BP 127/74 | HR 90 | Ht 70.0 in | Wt 156.0 lb

## 2019-07-23 DIAGNOSIS — K5909 Other constipation: Secondary | ICD-10-CM

## 2019-07-23 DIAGNOSIS — N289 Disorder of kidney and ureter, unspecified: Secondary | ICD-10-CM

## 2019-07-25 ENCOUNTER — Other Ambulatory Visit: Payer: BC Managed Care – PPO

## 2019-07-29 ENCOUNTER — Telehealth: Payer: Self-pay | Admitting: Urology

## 2019-07-29 NOTE — Telephone Encounter (Signed)
Pt would like for someone to call her and clarify if her renal "cyst is inside or outside her kidney and what signs or symptoms does she need to watch for to come in to have evaluated" Please advise pt at [phone]

## 2019-07-29 NOTE — Telephone Encounter (Signed)
The cyst is inside of her kidney.  Flank pain or blood in the urine.  Vanna Scotland, MD

## 2019-07-29 NOTE — Telephone Encounter (Signed)
Left patient a VM with details. Asked to return call with any questions.  

## 2019-07-30 ENCOUNTER — Telehealth: Payer: Self-pay | Admitting: Internal Medicine

## 2019-07-30 NOTE — Telephone Encounter (Signed)
3 attempts to schedule fu appt from recall list.   Deleting recall.   

## 2019-07-31 ENCOUNTER — Other Ambulatory Visit: Payer: Self-pay | Admitting: Neurology

## 2019-07-31 DIAGNOSIS — R519 Headache, unspecified: Secondary | ICD-10-CM

## 2019-08-04 ENCOUNTER — Telehealth: Payer: Self-pay

## 2019-08-04 NOTE — Telephone Encounter (Signed)
Pt called triage line stating she had a yeast infection a few weeks ago and used Monistat. She is now experiencing an odor, no itching. Can a medication be called in or does she need an appointment?

## 2019-08-04 NOTE — Telephone Encounter (Signed)
Needs appt.  Thx.

## 2019-08-04 NOTE — Telephone Encounter (Signed)
Pls call pt and schedule appt.

## 2019-08-05 NOTE — Telephone Encounter (Signed)
Called and left voicemail for patient to call back to be scheduled. 

## 2019-08-06 ENCOUNTER — Other Ambulatory Visit: Payer: Self-pay

## 2019-08-06 ENCOUNTER — Encounter: Payer: Self-pay | Admitting: Obstetrics and Gynecology

## 2019-08-06 ENCOUNTER — Ambulatory Visit (INDEPENDENT_AMBULATORY_CARE_PROVIDER_SITE_OTHER): Payer: BC Managed Care – PPO | Admitting: Obstetrics and Gynecology

## 2019-08-06 VITALS — BP 116/80 | Ht 70.0 in | Wt 156.0 lb

## 2019-08-06 DIAGNOSIS — N898 Other specified noninflammatory disorders of vagina: Secondary | ICD-10-CM | POA: Diagnosis not present

## 2019-08-06 LAB — POCT WET PREP WITH KOH
Clue Cells Wet Prep HPF POC: NEGATIVE
KOH Prep POC: NEGATIVE
Trichomonas, UA: NEGATIVE
Yeast Wet Prep HPF POC: NEGATIVE

## 2019-08-06 MED ORDER — NUVESSA 1.3 % VA GEL: 1.0000 | Freq: Once | VAGINAL | 0 refills | Status: AC

## 2019-08-06 NOTE — Progress Notes (Signed)
Cristina Grana, FNP   Chief Complaint  Patient presents with  . Vaginal Discharge    fishy/sour odor, no itchiness or irritation x 3 weeks    HPI:      [name] is a 49 y.o. G0P0000 who LMP was No LMP recorded. (Menstrual status: Perimenopausal)., presents today for fishy odor without increased d/c or irritation for the past wk. Has mild pelvic discomfort and LBP. No urin sx. Had yeast vag sx with increased d/c and irritation, no fishy odor about 3 wks ago and treated with monistat-3 with sx relief, except fishy odor now. Not sex active. No hx of BV. On abx before sx started.  Stopped OCPs 2/21 and no vag bleeding since.   Past Medical History:  Diagnosis Date  . Asthma   . Chronic pelvic pain in female 2018  . Cystitis   . Cystocele   . Endometrial polyp   . Endometriosis    per pt report but never seen during surg  . Frequent headaches   . Gastritis   . GERD (gastroesophageal reflux disease)    rare  . Gross hematuria   . History of kidney stones   . Microscopic hematuria   . Migraine   . Migraines   . Osteopenia   . Urinary disorder   . UTI (lower urinary tract infection)     Past Surgical History:  Procedure Laterality Date  . COLONOSCOPY N/A 11/05/2014   Procedure: COLONOSCOPY;  Surgeon: Scot Jun, MD;  Location: [facility] ENDOSCOPY;  Service: Endoscopy;  Laterality: N/A;  . COLONOSCOPY  11/2016   Dr. Mechele Collin  . CYSTOSCOPY  2007   with biopsy   . CYSTOSCOPY N/A 06/01/2016   Procedure: CYSTOSCOPY;  Surgeon: Vena Austria, MD;  Location: [facility] ORS;  Service: Gynecology;  Laterality: N/A;  . DIAGNOSTIC LAPAROSCOPY    . DILATATION & CURETTAGE/HYSTEROSCOPY WITH MYOSURE N/A 04/13/2015   Procedure: DILATATION & CURETTAGE/HYSTEROSCOPY WITH MYOSURE/POLYPECTOMY;  Surgeon: Elenora Fender Ward, MD;  Location: [facility] ORS;  Service: Gynecology;  Laterality: N/A;  . DILATATION & CURETTAGE/HYSTEROSCOPY WITH MYOSURE N/A 06/01/2016   Procedure: DILATATION &  CURETTAGE/HYSTEROSCOPY WITH MYOSURE;  Surgeon: Vena Austria, MD;  Location: [facility] ORS;  Service: Gynecology;  Laterality: N/A;  . GUM SURGERY    . laproscopy  2007  . POLYPECTOMY     endometrial    Family History  Problem Relation Age of Onset  . Colon cancer Father 60  . Arthritis Father   . Hyperlipidemia Father   . Transient ischemic attack Father   . Cancer - Colon Father 9  . Prostate cancer Father 29  . Valvular heart disease Father   . Arthritis Mother   . Heart disease Mother   . Stroke Mother        TIA  . Hypertension Mother   . Diabetes Mother   . Kidney cancer Mother 98  . Heart failure Mother   . Hyperlipidemia Mother   . Cancer Maternal Grandmother   . Cancer Paternal Grandfather        prostate  . Breast cancer Cousin 35       has contact    Social History   Socioeconomic History  . Marital status: Single    Spouse name: Not on file  . Number of children: Not on file  . Years of education: Not on file  . Highest education level: Not on file  Occupational History  . Not on file  Tobacco Use  .  Smoking status: Never Smoker  . Smokeless tobacco: Never Used  Substance and Sexual Activity  . Alcohol use: No  . Drug use: No  . Sexual activity: Not Currently    Birth control/protection: Pill  Other Topics Concern  . Not on file  Social History Narrative   Lives with twin sister   Work- [employer]    No pets    No children    Right handed    No caffeine daily- tea occasionally; eats chocolate    Enjoys shopping, resting, [place] time [place] the lake.    Social Determinants of Health   Financial Resource Strain:   . Difficulty of Paying Living Expenses:   Food Insecurity:   . Worried About Charity fundraiser in the Last Year:   . Arboriculturist in the Last Year:   Transportation Needs:   . Film/video editor (Medical):   Marland Kitchen Lack of Transportation (Non-Medical):   Physical Activity:   . Days of Exercise per  Week:   . Minutes of Exercise per Session:   Stress:   . Feeling of Stress :   Social Connections:   . Frequency of Communication with Friends and Family:   . Frequency of Social Gatherings with Friends and Family:   . Attends Religious Services:   . Active Member of Clubs or Organizations:   . Attends Archivist Meetings:   Marland Kitchen Marital Status:   Intimate Partner Violence:   . Fear of Current or Ex-Partner:   . Emotionally Abused:   Marland Kitchen Physically Abused:   . Sexually Abused:     Outpatient Medications Prior to Visit  Medication Sig Dispense Refill  . Azelastine HCl 137 MCG/SPRAY SOLN SMARTSIG:2 Spray(s) Both Nares Twice Daily PRN    . Calcium Carbonate-Vit D-Min (CALCIUM 1200 PO) Take 2-3 tablets by mouth daily. Reported on 05/26/2015    . SUMAtriptan (IMITREX) 100 MG tablet     . amitriptyline (ELAVIL) 10 MG tablet Take 10 mg by mouth at bedtime.    Marland Kitchen aspirin-acetaminophen-caffeine (EXCEDRIN MIGRAINE) 250-250-65 MG tablet Take 1 tablet by mouth every 6 (six) hours as needed for headache. 30 tablet 0  . escitalopram (LEXAPRO) 10 MG tablet Take 1 tablet by mouth once daily 90 tablet 0  . levonorgestrel-ethinyl estradiol (JOLESSA) 0.15-0.03 MG tablet Take 1 tablet by mouth daily. 1 Package 3  . omeprazole (PRILOSEC) 40 MG capsule Take 40 mg by mouth daily.     No facility-administered medications prior to visit.      ROS:  Review of Systems  Constitutional: Negative for fever.  Gastrointestinal: Negative for blood in stool, constipation, diarrhea, nausea and vomiting.  Genitourinary: Positive for vaginal discharge. Negative for dyspareunia, dysuria, flank pain, frequency, hematuria, urgency, vaginal bleeding and vaginal pain.  Musculoskeletal: Negative for back pain.  Skin: Negative for rash.   BREAST: No symptoms   OBJECTIVE:   Vitals:  BP 116/80   Ht 5\' 10"  (1.778 m)   Wt 156 lb (70.8 kg)   BMI 22.38 kg/m   Physical Exam Vitals reviewed.  Constitutional:       Appearance: She is well-developed.  Pulmonary:     Effort: Pulmonary effort is normal.  Genitourinary:    General: Normal vulva.     Pubic Area: No rash.      Labia:        Right: No rash, tenderness or lesion.        Left: No rash, tenderness or  lesion.      Vagina: Vaginal discharge present. No erythema or tenderness.     Cervix: Normal.     Uterus: Normal. Not enlarged and not tender.      Adnexa: Right adnexa normal and left adnexa normal.       Right: No mass or tenderness.         Left: No mass or tenderness.    Musculoskeletal:        General: Normal range of motion.     Cervical back: Normal range of motion.  Skin:    General: Skin is warm and dry.  Neurological:     General: No focal deficit present.     Mental Status: She is alert and oriented to person, place, and time.  Psychiatric:        Mood and Affect: Mood normal.        Behavior: Behavior normal.        Thought Content: Thought content normal.        Judgment: Judgment normal.     Results: Results for orders placed or performed in visit on 08/06/19 (from the past 24 hour(s))  POCT Wet Prep with KOH     Status: Normal   Collection Time: 08/06/19  4:26 PM  Result Value Ref Range   Trichomonas, UA Negative    Clue Cells Wet Prep HPF POC negn    Epithelial Wet Prep HPF POC     Yeast Wet Prep HPF POC neg    Bacteria Wet Prep HPF POC     RBC Wet Prep HPF POC     WBC Wet Prep HPF POC     KOH Prep POC Negative Negative     Assessment/Plan: Vaginal odor - Plan: POCT Wet Prep with KOH; Pos sx/neg wet prep. Treat empirically for BV. 1 sample nuvessa. F/u prn.    Meds ordered this encounter  Medications  . metroNIDAZOLE (NUVESSA) 1.3 % GEL    Sig: Place 1 Tube vaginally once for 1 dose. SAMPLE GIVEN    Dispense:  5 g    Refill:  0    Order Specific Question:   Supervising Provider    Answer:   Nadara Mustard [[id number]]      Return if symptoms worsen or fail to improve.  Farhan Jean B. Sheilla Maris,  PA-C 08/06/2019 4:27 PM

## 2019-08-06 NOTE — Patient Instructions (Signed)
I value your feedback and entrusting us with your care. If you get a [place] patient survey, I would appreciate you taking the time to let us know about your experience today. Thank you!  As of March 27, 2019, your lab results will be released to your MyChart immediately, before I even have a chance to see them. Please give me time to review them and contact you if there are any abnormalities. Thank you for your patience.  

## 2019-08-10 ENCOUNTER — Ambulatory Visit: Payer: BC Managed Care – PPO

## 2019-08-13 ENCOUNTER — Ambulatory Visit: Payer: BC Managed Care – PPO | Admitting: Urology

## 2019-08-14 ENCOUNTER — Telehealth: Payer: Self-pay

## 2019-08-14 NOTE — Telephone Encounter (Signed)
Ok. Will do nuswab for BV.

## 2019-08-14 NOTE — Telephone Encounter (Signed)
She has been added to Tuesday schedule

## 2019-08-14 NOTE — Telephone Encounter (Signed)
Pt with odor and neg wet prep at 08/06/19 appt. Treated empirically with nuvessa. Since no sx relief, not BV. Make sure pt not using any scented detergents or soaps or wipes, no dryer sheets. If sx still persist, will check culture (pt can come by for self-swab). Just let me know. Thx.

## 2019-08-14 NOTE — Telephone Encounter (Signed)
Pt called triage to let [facility] know that the ointment is not working she still has a vaginal odor.

## 2019-08-14 NOTE — Telephone Encounter (Signed)
Called pt no answer, LVMTRC. ?

## 2019-08-16 ENCOUNTER — Ambulatory Visit: Payer: BC Managed Care – PPO

## 2019-08-19 ENCOUNTER — Other Ambulatory Visit: Payer: Self-pay

## 2019-08-19 ENCOUNTER — Ambulatory Visit
Admission: RE | Admit: 2019-08-19 | Discharge: 2019-08-19 | Disposition: A | Discharge status: Home / Self Care | Payer: BC Managed Care – PPO | Source: Ambulatory Visit | Attending: Neurology | Admitting: Neurology

## 2019-08-19 DIAGNOSIS — R519 Headache, unspecified: Secondary | ICD-10-CM

## 2019-08-19 MED ORDER — GADOBUTROL 1 MMOL/ML IV SOLN
7.0000 mL | Freq: Once | INTRAVENOUS | Status: AC | PRN
  Administered 2019-08-19: 7 mL via INTRAVENOUS

## 2019-08-20 ENCOUNTER — Encounter (INDEPENDENT_AMBULATORY_CARE_PROVIDER_SITE_OTHER): Payer: Self-pay

## 2019-08-20 ENCOUNTER — Ambulatory Visit: Payer: BC Managed Care – PPO | Admitting: Obstetrics and Gynecology

## 2019-08-20 ENCOUNTER — Encounter: Payer: Self-pay | Admitting: Urology

## 2019-08-20 ENCOUNTER — Ambulatory Visit: Payer: BC Managed Care – PPO | Admitting: Urology

## 2019-08-20 VITALS — BP 128/82 | HR 80 | Ht 70.0 in | Wt 156.0 lb

## 2019-08-20 DIAGNOSIS — R339 Retention of urine, unspecified: Secondary | ICD-10-CM | POA: Diagnosis not present

## 2019-08-21 ENCOUNTER — Telehealth: Payer: Self-pay

## 2019-08-21 NOTE — Telephone Encounter (Signed)
Patient called complaining of leakage and blood on her underwear from catheter. Patient stated she had her catheter bad folded all day and did not empty the bag. I advised patient to empty bag, and to not fold bag or tug on foley, patient verbalized understanding. Patient did state it is still draining. Advised patient if any more issues to call tomorrow morning. Patient understood

## 2019-08-22 ENCOUNTER — Ambulatory Visit (INDEPENDENT_AMBULATORY_CARE_PROVIDER_SITE_OTHER): Payer: BC Managed Care – PPO

## 2019-08-22 ENCOUNTER — Other Ambulatory Visit: Payer: Self-pay

## 2019-08-22 DIAGNOSIS — R339 Retention of urine, unspecified: Secondary | ICD-10-CM | POA: Diagnosis not present

## 2019-08-22 LAB — BLADDER SCAN AMB NON-IMAGING: Scan Result: 562

## 2019-08-22 NOTE — Progress Notes (Signed)
Patient present today complaining of leg bag leaking. Upon examine the bag today was folded incorrectly in an accordion style and the bag was leaking due to this. The leg bag was exchanged and patient was shown and given proper instruction on how bag should be placed on her leg to avoid this in the future. Patient also states that she is having discomfort while sitting with the catheter in place. Her catheter was noted to be folded in her labia. The tubing was adjusted and a new velcro leg strap was attached to her leg to avoid this in the future. Urine return was noted in the bag and was clear yellow no blood was seen. There was some dried blood on the outside of the catheter near the urethra that caused concern for the patient. It was explained that this could be a result of the tubing and bag pulling and tugging and to avoid in the future the tubing and straps should be kept on her leg rather than folded. Patient verbalized understanding and will keep follow up as scheduled

## 2019-08-24 NOTE — Progress Notes (Signed)
08/20/2019 5:55 PM   Cristina Wade 30-Oct-1970 [id number]  Referring provider: [name], FNP [address] [place],  [place] [address]  Chief Complaint  Patient presents with  . Urinary Retention    HPI: Cristina Wade is a 49 year old female with chronic constipation, rUTI's, IC, dysfunctional voiding, renal cyst and nephrolithiasis who presents today with the inability to empty her bladder.  She visited the [facility] acute care office on Aug 16, 2019 for symptoms of urinary frequency, odor, low back and pelvic pain x2 to 3 weeks.  She was prescribed Cipro.  Urine culture grew out Citrobacter freundii.    She is still experiencing issues with constipation.    Today, she is experiencing difficulty urinating, weak urinary stream, lower back pain and suprapubic pain.  Patient denies any modifying or aggravating factors.  Patient denies any gross hematuria Patient denies any fevers, chills, nausea or vomiting.   Her PVR is 562 mL.     PMH: Past Medical History:  Diagnosis Date  . Asthma   . Chronic pelvic pain in female 2018  . Cystitis   . Cystocele   . Endometrial polyp   . Endometriosis    per pt report but never seen during surg  . Frequent headaches   . Gastritis   . GERD (gastroesophageal reflux disease)    rare  . Gross hematuria   . History of kidney stones   . Microscopic hematuria   . Migraine   . Migraines   . Osteopenia   . Urinary disorder   . UTI (lower urinary tract infection)     Surgical History: Past Surgical History:  Procedure Laterality Date  . COLONOSCOPY N/A 11/05/2014   Procedure: COLONOSCOPY;  Surgeon: Scot Jun, MD;  Location: [facility] ENDOSCOPY;  Service: Endoscopy;  Laterality: N/A;  . COLONOSCOPY  11/2016   Dr. Mechele Collin  . CYSTOSCOPY  2007   with biopsy   . CYSTOSCOPY N/A 06/01/2016   Procedure: CYSTOSCOPY;  Surgeon: Vena Austria, MD;  Location: [facility] ORS;  Service: Gynecology;  Laterality: N/A;  .  DIAGNOSTIC LAPAROSCOPY    . DILATATION & CURETTAGE/HYSTEROSCOPY WITH MYOSURE N/A 04/13/2015   Procedure: DILATATION & CURETTAGE/HYSTEROSCOPY WITH MYOSURE/POLYPECTOMY;  Surgeon: Elenora Fender Ward, MD;  Location: [facility] ORS;  Service: Gynecology;  Laterality: N/A;  . DILATATION & CURETTAGE/HYSTEROSCOPY WITH MYOSURE N/A 06/01/2016   Procedure: DILATATION & CURETTAGE/HYSTEROSCOPY WITH MYOSURE;  Surgeon: Vena Austria, MD;  Location: [facility] ORS;  Service: Gynecology;  Laterality: N/A;  . GUM SURGERY    . laproscopy  2007  . POLYPECTOMY     endometrial    Home Medications:  Allergies as of 08/20/2019      Reactions   Lac Bovis Other (See Comments)   Milk-related Compounds    Omeprazole Other (See Comments)   Pt reports "Chest pain, HA and Dysuria."   Penicillins Itching, Rash      Medication List       Accurate as of Aug 20, 2019 11:59 PM. If you have any questions, ask your nurse or doctor.        STOP taking these medications   SUMAtriptan 100 MG tablet Commonly known as: IMITREX Stopped by: Michiel Cowboy, PA-C     TAKE these medications   Azelastine HCl 137 MCG/SPRAY Soln SMARTSIG:2 Spray(s) Both Nares Twice Daily PRN   CALCIUM 1200 PO Take 2-3 tablets by mouth daily. Reported on 05/26/2015       Allergies:  Allergies  Allergen Reactions  . Lac Bovis Other (See Comments)  . Milk-Related Compounds   . Omeprazole Other (See Comments)    Pt reports "Chest pain, HA and Dysuria."  . Penicillins Itching and Rash    Family History: Family History  Problem Relation Age of Onset  . Colon cancer Father 33  . Arthritis Father   . Hyperlipidemia Father   . Transient ischemic attack Father   . Cancer - Colon Father 69  . Prostate cancer Father 92  . Valvular heart disease Father   . Arthritis Mother   . Heart disease Mother   . Stroke Mother        TIA  . Hypertension Mother   . Diabetes Mother   . Kidney cancer Mother 76  . Heart failure Mother   . Hyperlipidemia  Mother   . Cancer Maternal Grandmother   . Cancer Paternal Grandfather        prostate  . Breast cancer Cousin 35       has contact    Social History:  reports that she has never smoked. She has never used smokeless tobacco. She reports that she does not drink alcohol or use drugs.  ROS: Pertinent ROS in HPI  Physical Exam: BP 128/82   Pulse 80   Ht 5\' 10"  (1.778 m)   Wt 156 lb (70.8 kg)   BMI 22.38 kg/m   Constitutional:  Well nourished. Alert and oriented, No acute distress. HEENT: [place] AT, mask in place Trachea midline Cardiovascular: No clubbing, cyanosis, or edema. Respiratory: Normal respiratory effort, no increased work of breathing. Neurologic: Grossly intact, no focal deficits, moving all 4 extremities. Psychiatric: Normal mood and affect.  Laboratory Data: Lab Results  Component Value Date   WBC 5.6 07/14/2019   HGB 13.2 07/14/2019   HCT 39.8 07/14/2019   MCV 90.5 07/14/2019   PLT 225 07/14/2019    Lab Results  Component Value Date   CREATININE 0.73 07/14/2019    Lab Results  Component Value Date   HGBA1C 5.4 06/03/2018    Lab Results  Component Value Date   TSH 1.46 06/03/2018       Component Value Date/Time   CHOL 178 11/29/2016 0753   HDL 50.60 11/29/2016 0753   CHOLHDL 4 11/29/2016 0753   VLDL 14.0 11/29/2016 0753   LDLCALC 113 (H) 11/29/2016 0753    Lab Results  Component Value Date   AST 16 04/08/2019   Lab Results  Component Value Date   ALT 12 04/08/2019    Urinalysis    Component Value Date/Time   COLORURINE STRAW (A) 04/08/2019 1300   APPEARANCEUR Cloudy (A) 05/07/2019 1411   LABSPEC 1.010 04/08/2019 1300   PHURINE 8.0 04/08/2019 1300   GLUCOSEU Negative 05/07/2019 1411   GLUCOSEU NEGATIVE 06/03/2018 1637   HGBUR SMALL (A) 04/08/2019 1300   BILIRUBINUR Negative 05/07/2019 1411   KETONESUR NEGATIVE 04/08/2019 1300   PROTEINUR Negative 05/07/2019 1411   PROTEINUR NEGATIVE 04/08/2019 1300   UROBILINOGEN 0.2 06/13/2018  1458   UROBILINOGEN 0.2 06/03/2018 1637   NITRITE Negative 05/07/2019 1411   NITRITE NEGATIVE 04/08/2019 1300   LEUKOCYTESUR Trace (A) 05/07/2019 1411   LEUKOCYTESUR NEGATIVE 04/08/2019 1300    I have reviewed the labs.   Pertinent Imaging: Results for GIRL, SCHISSLER (MRN Cristina Wade) as of 08/24/2019 17:49  Ref. Range 08/22/2019 13:28  Scan Result Unknown [id number]   I have independently reviewed the films.    Simple Catheter Placement Due to urinary retention patient  is present today for a foley cath placement.  Patient was cleaned and prepped in a sterile fashion with betadine. A 18 FR foley catheter was inserted, urine return was noted  500 ml, urine was yellow in color.  The balloon was filled with 10cc of sterile water.  A leg bag was attached for drainage. Patient was also given a night bag to take home and was given instruction on how to change from one bag to another.  Patient was given instruction on proper catheter care.  Patient tolerated well, no complications were noted   Performed by: Zara Council, PA-C   Assessment & Plan:    1. Incomplete bladder emptying - Bladder Scan (Post Void Residual) in office -I explained to her other issues with her constipation may be contributing to her incomplete bladder emptying -I offered to instruct her in straight cathing technique, so that she could facilitate better emptying of her bladder but she deferred -The other alternative is to place a Foley catheter for 1 week until her bowel function can get on a more regular schedule she would like to have a Foley catheter placed   Return in about 1 week (around 08/27/2019) for TOV .  These notes generated with voice recognition software. I apologize for typographical errors.  Zara Council, PA-C  [facility] [address]  [place], [place] [address] [phone]

## 2019-08-26 ENCOUNTER — Telehealth: Payer: Self-pay | Admitting: Internal Medicine

## 2019-08-26 NOTE — Telephone Encounter (Signed)
Patient returned call and declined earlier appointment that was offered. Patient states she will come in on 5/28 as scheduled

## 2019-08-26 NOTE — Telephone Encounter (Signed)
No answer. Left message to call back. There are some openings tomorrow afternoon. Hold placed. Could also schedule sister as well.

## 2019-08-26 NOTE — Telephone Encounter (Signed)
Pt c/o of Chest Pain: STAT if CP now or developed within 24 hours  1. Are you having CP right now? no  2. Are you experiencing any other symptoms (ex. SOB, nausea, vomiting, sweating)? no  3. How long have you been experiencing CP? 1 week  4. Is your CP continuous or coming and going? Comes and goes   5. Have you taken Nitroglycerin?  Yes asa helps    DECLINED SOONER WITH APP DECLINED SOONER WITH END AS SHE WANTS AFTERNOON AND SISTER TO HAVE SAME DAY CLOSE APPT. ?

## 2019-08-27 ENCOUNTER — Telehealth: Payer: Self-pay

## 2019-08-27 NOTE — Telephone Encounter (Signed)
Returned patient's call, she doesn't know who the person was that called her but she says that she think it was in regards to her calling yesterday and wanting to reschedule her new patient appointment. Her new patient appointment has already been rescheduled.

## 2019-08-27 NOTE — Telephone Encounter (Signed)
Copied from CRM [id number]. Topic: General - Inquiry >> Aug 27, 2019  3:38 PM Leary Roca wrote: Reason for CRM: Pt was calling regarding several appts that was sch . Phone was disconnected / called back once no answer

## 2019-08-28 ENCOUNTER — Ambulatory Visit: Payer: Self-pay | Admitting: Urology

## 2019-08-28 ENCOUNTER — Other Ambulatory Visit: Payer: Self-pay

## 2019-08-28 ENCOUNTER — Ambulatory Visit (INDEPENDENT_AMBULATORY_CARE_PROVIDER_SITE_OTHER): Payer: BC Managed Care – PPO | Admitting: Urology

## 2019-08-28 DIAGNOSIS — R339 Retention of urine, unspecified: Secondary | ICD-10-CM | POA: Diagnosis not present

## 2019-08-28 NOTE — Progress Notes (Signed)
Catheter Removal  Patient is present today for a catheter removal.  8 ml of water was drained from the balloon. A 18 FR foley cath was removed from the bladder no complications were noted . Patient tolerated well.  Performed by: Michiel Cowboy, PA-C  Follow up/ Additional notes: She will return this afternoon for a PVR.   Bladder Scan Patient can void: residual is 266 ml Performed By: Teressa Lower, CMA  Continuous Intermittent Catheterization  Due to incomplete bladder emptying patient is present today for a teaching of self I & O Catheterization. Patient was given detailed verbal and printed instructions of self catheterization. Patient was cleaned and prepped in a sterile fashion.  With instruction and assistance patient inserted a 14 FR and urine return was noted 260 ml, urine was yellow clear in color. Patient tolerated well, no complications were noted Patient was given a sample bag with supplies to take home.  Instructions were given per me for patient to cath when she feels she cannot void or she does not feel she has completely emptied her bladder.  She will record how many times she needs to cath and the volumes.  Patient is to follow up in one month to review her log book.   Performed by: Michiel Cowboy, PA-C

## 2019-08-29 ENCOUNTER — Encounter: Payer: Self-pay | Admitting: Emergency Medicine

## 2019-08-29 ENCOUNTER — Telehealth: Payer: Self-pay | Admitting: Family Medicine

## 2019-08-29 ENCOUNTER — Emergency Department
Admission: EM | Admit: 2019-08-29 | Discharge: 2019-08-29 | Disposition: A | Discharge status: Home / Self Care | Payer: BC Managed Care – PPO | Attending: Emergency Medicine | Admitting: Emergency Medicine

## 2019-08-29 DIAGNOSIS — R002 Palpitations: Secondary | ICD-10-CM | POA: Insufficient documentation

## 2019-08-29 DIAGNOSIS — R1030 Lower abdominal pain, unspecified: Secondary | ICD-10-CM

## 2019-08-29 DIAGNOSIS — Z79899 Other long term (current) drug therapy: Secondary | ICD-10-CM | POA: Insufficient documentation

## 2019-08-29 DIAGNOSIS — J45909 Unspecified asthma, uncomplicated: Secondary | ICD-10-CM | POA: Diagnosis not present

## 2019-08-29 DIAGNOSIS — K5900 Constipation, unspecified: Secondary | ICD-10-CM | POA: Diagnosis not present

## 2019-08-29 LAB — CBC WITH DIFFERENTIAL/PLATELET
Abs Immature Granulocytes: 0.02 10*3/uL (ref 0.00–0.07)
Basophils Absolute: 0 10*3/uL (ref 0.0–0.1)
Basophils Relative: 1 %
Eosinophils Absolute: 0.1 10*3/uL (ref 0.0–0.5)
Eosinophils Relative: 1 %
HCT: 42.1 % (ref 36.0–46.0)
Hemoglobin: 14.1 g/dL (ref 12.0–15.0)
Immature Granulocytes: 0 %
Lymphocytes Relative: 23 %
Lymphs Abs: 1.4 10*3/uL (ref 0.7–4.0)
MCH: 29.7 pg (ref 26.0–34.0)
MCHC: 33.5 g/dL (ref 30.0–36.0)
MCV: 88.8 fL (ref 80.0–100.0)
Monocytes Absolute: 0.5 10*3/uL (ref 0.1–1.0)
Monocytes Relative: 8 %
Neutro Abs: 4.3 10*3/uL (ref 1.7–7.7)
Neutrophils Relative %: 67 %
Platelets: 218 10*3/uL (ref 150–400)
RBC: 4.74 MIL/uL (ref 3.87–5.11)
RDW: 12 % (ref 11.5–15.5)
WBC: 6.3 10*3/uL (ref 4.0–10.5)
nRBC: 0 % (ref 0.0–0.2)

## 2019-08-29 LAB — COMPREHENSIVE METABOLIC PANEL
ALT: 31 U/L (ref 0–44)
AST: 26 U/L (ref 15–41)
Albumin: 4.7 g/dL (ref 3.5–5.0)
Alkaline Phosphatase: 61 U/L (ref 38–126)
Anion gap: 7 (ref 5–15)
BUN: 12 mg/dL (ref 6–20)
CO2: 27 mmol/L (ref 22–32)
Calcium: 9.9 mg/dL (ref 8.9–10.3)
Chloride: 108 mmol/L (ref 98–111)
Creatinine, Ser: 0.65 mg/dL (ref 0.44–1.00)
GFR calc Af Amer: >60 mL/min (ref >60)
GFR calc non Af Amer: >60 mL/min (ref >60)
Glucose, Bld: 106 mg/dL — ABNORMAL HIGH (ref 70–99)
Potassium: 4.9 mmol/L (ref 3.5–5.1)
Sodium: 142 mmol/L (ref 135–145)
Total Bilirubin: 0.9 mg/dL (ref 0.3–1.2)
Total Protein: 8.4 g/dL — ABNORMAL HIGH (ref 6.5–8.1)

## 2019-08-29 LAB — URINALYSIS, COMPLETE (UACMP) WITH MICROSCOPIC
Bacteria, UA: NONE SEEN
Bilirubin Urine: NEGATIVE
Glucose, UA: NEGATIVE mg/dL
Ketones, ur: NEGATIVE mg/dL
Nitrite: NEGATIVE
Protein, ur: NEGATIVE mg/dL
Specific Gravity, Urine: 1.006 (ref 1.005–1.030)
pH: 7 (ref 5.0–8.0)

## 2019-08-29 LAB — LIPASE, BLOOD: Lipase: 85 U/L — ABNORMAL HIGH (ref 11–51)

## 2019-08-29 MED ORDER — TAMSULOSIN HCL 0.4 MG PO CAPS: 0.4000 mg | ORAL_CAPSULE | Freq: Every day | ORAL | 0 refills | Status: DC

## 2019-08-29 MED ORDER — BISACODYL 10 MG RE SUPP: 10.0000 mg | RECTAL | 0 refills | Status: DC | PRN

## 2019-08-29 MED ORDER — POLYETHYLENE GLYCOL 3350 17 GM/SCOOP PO POWD: ORAL | 0 refills | Status: DC

## 2019-08-29 MED ORDER — SENNOSIDES-DOCUSATE SODIUM 8.6-50 MG PO TABS: 2.0000 | ORAL_TABLET | Freq: Two times a day (BID) | ORAL | 0 refills | Status: DC

## 2019-08-29 NOTE — ED Notes (Signed)
Patient said she had heart racing on and off for 1 week.  She already has appt with dr end.  Explained her labs as she asked about kidney function/liver function.  Did ekg and gave to md.

## 2019-08-29 NOTE — Telephone Encounter (Signed)
Patient called requesting a referral for pelvic floor. She also requested flomax to help her urinate better. I sent a 1 month supply of the Tamsulosin for her. I also saw in her chart that she went to the ED for constipation. I informed her that is she is having problems with constipation this could be the reason she is not urinating well. Patient voiced understanding and will continue to [facility] if needed.

## 2019-08-29 NOTE — ED Provider Notes (Signed)
[facility] Emergency Department Provider Note  ____________________________________________  Time seen: Approximately 8:48 AM  I have reviewed the triage vital signs and the nursing notes.   HISTORY  Chief Complaint Abdominal Pain    HPI Cristina Wade is a 49 y.o. female with a history of chronic pelvic pain, endometriosis, gastritis, GERD, constipation, anxiety who comes the ED complaining of low abdominal pain for the past 3 or 4 days, intermittent, radiating to the lower back.  Feels achy.  No aggravating or alleviating factors.  Not affected by eating.  No dysuria frequency urgency.  She also notes she has been constipated for the past few days and was only able to pass a small amount of hard stool last night.  This is a recurrent issue for her for which she takes 4 tablets of senna each night.  Denies vomiting.  Currently hungry and wants to eat.      Past Medical History:  Diagnosis Date  . Asthma   . Chronic pelvic pain in female 2018  . Cystitis   . Cystocele   . Endometrial polyp   . Endometriosis    per pt report but never seen during surg  . Frequent headaches   . Gastritis   . GERD (gastroesophageal reflux disease)    rare  . Gross hematuria   . History of kidney stones   . Microscopic hematuria   . Migraine   . Migraines   . Osteopenia   . Urinary disorder   . UTI (lower urinary tract infection)      Patient Active Problem List   Diagnosis Date Noted  . Urinary retention 08/12/2018  . Anxiety 08/12/2018  . Elevated blood pressure reading without diagnosis of hypertension 07/12/2018  . Left leg pain 07/12/2018  . Acute left-sided low back pain 06/03/2018  . Cloudy urine 04/29/2018  . Chest pain 04/29/2018  . Frontal headache 04/27/2018  . Localized, primary osteoarthritis 02/07/2018  . Pelvic pain 01/30/2018  . Slow transit constipation 10/10/2017  . Bilateral impacted cerumen 11/28/2016  . Chronic bilateral low back pain  without sciatica 11/28/2016  . Precordial pain 07/07/2016  . Right bundle branch block (RBBB) on electrocardiogram (ECG) 07/07/2016  . Chest tightness 06/27/2016  . Preventative health care 09/30/2015  . Encounter to establish care 09/28/2014  . Vestibular migraine 09/28/2014  . Encounter for screening colonoscopy 11/21/2013     Past Surgical History:  Procedure Laterality Date  . COLONOSCOPY N/A 11/05/2014   Procedure: COLONOSCOPY;  Surgeon: Scot Jun, MD;  Location: [facility] ENDOSCOPY;  Service: Endoscopy;  Laterality: N/A;  . COLONOSCOPY  11/2016   Dr. Mechele Collin  . CYSTOSCOPY  2007   with biopsy   . CYSTOSCOPY N/A 06/01/2016   Procedure: CYSTOSCOPY;  Surgeon: Vena Austria, MD;  Location: [facility] ORS;  Service: Gynecology;  Laterality: N/A;  . DIAGNOSTIC LAPAROSCOPY    . DILATATION & CURETTAGE/HYSTEROSCOPY WITH MYOSURE N/A 04/13/2015   Procedure: DILATATION & CURETTAGE/HYSTEROSCOPY WITH MYOSURE/POLYPECTOMY;  Surgeon: Elenora Fender Ward, MD;  Location: [facility] ORS;  Service: Gynecology;  Laterality: N/A;  . DILATATION & CURETTAGE/HYSTEROSCOPY WITH MYOSURE N/A 06/01/2016   Procedure: DILATATION & CURETTAGE/HYSTEROSCOPY WITH MYOSURE;  Surgeon: Vena Austria, MD;  Location: [facility] ORS;  Service: Gynecology;  Laterality: N/A;  . GUM SURGERY    . laproscopy  2007  . POLYPECTOMY     endometrial     Prior to Admission medications   Medication Sig Start Date End Date Taking? Authorizing Provider  Azelastine HCl 137  MCG/SPRAY SOLN SMARTSIG:2 Spray(s) Both Nares Twice Daily PRN 07/21/19   [provider]  bisacodyl (DULCOLAX) 10 MG suppository Place 1 suppository (10 mg total) rectally as needed for moderate constipation. 08/29/19   Sharman Cheek, MD  Calcium Carbonate-Vit D-Min (CALCIUM 1200 PO) Take 2-3 tablets by mouth daily. Reported on 05/26/2015    [provider]  polyethylene glycol powder (GLYCOLAX/MIRALAX) 17 GM/SCOOP powder 1 cap full in a full glass of water, two  times a day for 3 days. 08/29/19   Sharman Cheek, MD  senna-docusate (SENOKOT-S) 8.6-50 MG tablet Take 2 tablets by mouth 2 (two) times daily. 08/29/19   Sharman Cheek, MD     Allergies Lac bovis, Milk-related compounds, Omeprazole, and Penicillins   Family History  Problem Relation Age of Onset  . Colon cancer Father 42  . Arthritis Father   . Hyperlipidemia Father   . Transient ischemic attack Father   . Cancer - Colon Father 36  . Prostate cancer Father 43  . Valvular heart disease Father   . Arthritis Mother   . Heart disease Mother   . Stroke Mother        TIA  . Hypertension Mother   . Diabetes Mother   . Kidney cancer Mother 13  . Heart failure Mother   . Hyperlipidemia Mother   . Cancer Maternal Grandmother   . Cancer Paternal Grandfather        prostate  . Breast cancer Cousin 35       has contact    Social History Social History   Tobacco Use  . Smoking status: Never Smoker  . Smokeless tobacco: Never Used  Substance Use Topics  . Alcohol use: No  . Drug use: No    Review of Systems  Constitutional:   No fever or chills.  ENT:   No sore throat. No rhinorrhea. Cardiovascular:   No chest pain or syncope.  Occasional palpitations Respiratory:   No dyspnea or cough. Gastrointestinal: Positive as above for abdominal pain and constipation Musculoskeletal:   Negative for focal pain or swelling All other systems reviewed and are negative except as documented above in ROS and HPI.  ____________________________________________   PHYSICAL EXAM:  VITAL SIGNS: ED Triage Vitals [08/29/19 0556]  Enc Vitals Group     BP (!) 159/87     Pulse Rate 70     Resp 18     Temp 97.6 F (36.4 C)     Temp Source Oral     SpO2 98 %     Weight 156 lb (70.8 kg)     Height 5\' 10"  (1.778 m)     Head Circumference      Peak Flow      Pain Score 9     Pain Loc      Pain Edu?      Excl. in GC?     Vital signs reviewed, nursing assessments  reviewed.   Constitutional:   Alert and oriented. Non-toxic appearance. Eyes:   Conjunctivae are normal. EOMI. PERRL. ENT      Head:   Normocephalic and atraumatic.      Nose: Normal.      Mouth/Throat: Normal, moist mucosa, no oropharyngeal erythema or swelling      Neck:   No meningismus. Full ROM.  Cardiovascular:   RRR. Symmetric bilateral radial and DP pulses.  No murmurs. Cap refill less than 2 seconds. Respiratory:   Normal respiratory effort without tachypnea/retractions. Breath sounds are clear and  equal bilaterally. No wheezes/rales/rhonchi. Gastrointestinal:   Soft and nontender. Non distended. There is no CVA tenderness.  No rebound, rigidity, or guarding. Musculoskeletal:   Normal range of motion in all extremities. No joint effusions.  No lower extremity tenderness.  No edema. Neurologic:   Normal speech and language.  Motor grossly intact. No acute focal neurologic deficits are appreciated.  Skin:    Skin is warm, dry and intact. No rash noted.  No petechiae, purpura, or bullae.  ____________________________________________    LABS (pertinent positives/negatives) (all labs ordered are listed, but only abnormal results are displayed) Labs Reviewed  COMPREHENSIVE METABOLIC PANEL - Abnormal; Notable for the following components:      Result Value   Glucose, Bld 106 (*)    Total Protein 8.4 (*)    All other components within normal limits  LIPASE, BLOOD - Abnormal; Notable for the following components:   Lipase 85 (*)    All other components within normal limits  URINALYSIS, COMPLETE (UACMP) WITH MICROSCOPIC - Abnormal; Notable for the following components:   Color, Urine YELLOW (*)    APPearance CLEAR (*)    Hgb urine dipstick SMALL (*)    Leukocytes,Ua TRACE (*)    All other components within normal limits  CBC WITH DIFFERENTIAL/PLATELET   ____________________________________________   EKG  Interpreted by me Sinus rhythm rate of 63, normal axis and  intervals.  Normal QRS ST segments and T waves.  No acute ischemic changes or evidence of underlying dysrhythmia.  ____________________________________________    RADIOLOGY  No results found.  ____________________________________________   PROCEDURES Procedures  ____________________________________________  DIFFERENTIAL DIAGNOSIS   UTI, constipation, functional abdominal pain, endometriosis, anxiety  CLINICAL IMPRESSION / ASSESSMENT AND PLAN / ED COURSE  Medications ordered in the ED: Medications - No data to display  Pertinent labs & imaging results that were available during my care of the patient were reviewed by me and considered in my medical decision making (see chart for details).  Cristina Wade was evaluated in Emergency Department on 08/29/2019 for the symptoms described in the history of present illness. She was evaluated in the context of the global COVID-19 pandemic, which necessitated consideration that the patient might be at risk for infection with the SARS-CoV-2 virus that causes COVID-19. Institutional protocols and algorithms that pertain to the evaluation of patients at risk for COVID-19 are in a state of rapid change based on information released by regulatory bodies including the CDC and federal and state organizations. These policies and algorithms were followed during the patient's care in the ED.   Patient presents with low abdominal pain without specific triggers.  Vital signs are normal, exam is benign and reassuring. Considering the patient's symptoms, medical history, and physical examination today, I have low suspicion for cholecystitis or biliary pathology, pancreatitis, perforation or bowel obstruction, hernia, intra-abdominal abscess, AAA or dissection, volvulus or intussusception, mesenteric ischemia, or appendicitis.  Labs are normal including urinalysis.  Lipase is slightly elevated which is not a significant finding given lack of corresponding symptoms  or exam findings.  No evidence of STI PID TOA or torsion.  EKG is unremarkable.  I suspect this is related to her chronic pain and recurrent constipation especially as she is reporting hard stool and difficulty passing stool.  Recommend she replace her senna with Senokot, add on Dulcolax suppository and MiraLAX as needed.     ____________________________________________   FINAL CLINICAL IMPRESSION(S) / ED DIAGNOSES    Final diagnoses:  Lower abdominal pain  Constipation,  unspecified constipation type     ED Discharge Orders         Ordered    senna-docusate (SENOKOT-S) 8.6-50 MG tablet  2 times daily     08/29/19 0847    polyethylene glycol powder (GLYCOLAX/MIRALAX) 17 GM/SCOOP powder     08/29/19 0847    bisacodyl (DULCOLAX) 10 MG suppository  As needed     08/29/19 0847          Portions of this note were generated with dragon dictation software. Dictation errors may occur despite best attempts at proofreading.   Carrie Mew, MD 08/29/19 [phone]

## 2019-08-29 NOTE — ED Notes (Signed)
Has already been seen by provider.  Says she is very hungry.  Given crackers/peanut butter and water.

## 2019-08-29 NOTE — Discharge Instructions (Signed)
For your constipation, you should replace your senna with Senokot-S, which has both senna and another medicine called colace.  Take this medicine two times a day, every day to help prevent your recurrent constipation.    For the next few days, you should also use dulcolax suppositories and miralax powder as prescribed to improve your constipation and get back to normal.  Your blood tests today were all normal.  Your kidney function and liver function were normal.

## 2019-08-29 NOTE — ED Triage Notes (Signed)
Patient ambulatory to triage with steady gait, without difficulty or distress noted, mask in place; pt reports lower abd/back pain x 3-4 days with nausea, small BM last night

## 2019-09-09 ENCOUNTER — Telehealth: Payer: Self-pay

## 2019-09-09 NOTE — Telephone Encounter (Signed)
Incoming call from pt who states she does not feel that she is emptying her bladder well. Denies pain, fever, chills. Patient states that she has catheters at home and has been taught to self cath. She has self cathed once today with urine return. Patient also states that she d/c Flomax after taking one dose as she saw no benefits. Advised pt to resume Flomax, work on [employer] as she has not had one in 2 days, continue CIC as needed. Offered pt to been seen in clinic by PA, pt declined.

## 2019-09-10 ENCOUNTER — Telehealth: Payer: Self-pay | Admitting: Family

## 2019-09-10 DIAGNOSIS — F419 Anxiety disorder, unspecified: Secondary | ICD-10-CM

## 2019-09-10 NOTE — Telephone Encounter (Signed)
Ordered Please sch 

## 2019-09-10 NOTE — Telephone Encounter (Signed)
Cristina Wade and [name] called in requesting labs said that Dr.Arnett told them they could come anything and get labs

## 2019-09-10 NOTE — Telephone Encounter (Signed)
Patient scheduled for f/u with you on 6/25 & scheduled for labs in 6/9. Can labs be ordered for patient? 

## 2019-09-10 NOTE — Telephone Encounter (Signed)
Patient scheduled for labs & informed which labs were ordered.

## 2019-09-11 ENCOUNTER — Telehealth: Payer: Self-pay

## 2019-09-11 NOTE — Telephone Encounter (Signed)
Patient called stating that she is still having increased urgency and is worried that she is not emptying. She confirms CIC with no complications and denies dysuria, hematuria, fever, chills, nausea or vomiting. Patient was encouraged to increase water intake and continue flomax. Patient was offered an appointment with PA she declined and states she will call back if needed

## 2019-09-12 ENCOUNTER — Ambulatory Visit: Payer: BC Managed Care – PPO | Admitting: Internal Medicine

## 2019-09-13 ENCOUNTER — Emergency Department: Payer: BC Managed Care – PPO

## 2019-09-13 ENCOUNTER — Other Ambulatory Visit: Payer: Self-pay

## 2019-09-13 ENCOUNTER — Encounter: Payer: Self-pay | Admitting: Emergency Medicine

## 2019-09-13 DIAGNOSIS — R109 Unspecified abdominal pain: Secondary | ICD-10-CM | POA: Insufficient documentation

## 2019-09-13 DIAGNOSIS — R0789 Other chest pain: Secondary | ICD-10-CM | POA: Diagnosis present

## 2019-09-13 DIAGNOSIS — J45909 Unspecified asthma, uncomplicated: Secondary | ICD-10-CM | POA: Diagnosis not present

## 2019-09-13 DIAGNOSIS — Z79899 Other long term (current) drug therapy: Secondary | ICD-10-CM | POA: Insufficient documentation

## 2019-09-13 LAB — URINALYSIS, COMPLETE (UACMP) WITH MICROSCOPIC
Bilirubin Urine: NEGATIVE
Glucose, UA: NEGATIVE mg/dL
Hgb urine dipstick: NEGATIVE
Ketones, ur: NEGATIVE mg/dL
Leukocytes,Ua: NEGATIVE
Nitrite: NEGATIVE
Protein, ur: NEGATIVE mg/dL
Specific Gravity, Urine: 1.004 — ABNORMAL LOW (ref 1.005–1.030)
Squamous Epithelial / HPF: NONE SEEN (ref 0–5)
pH: 8 (ref 5.0–8.0)

## 2019-09-13 LAB — CBC
HCT: 40.7 % (ref 36.0–46.0)
Hemoglobin: 13.5 g/dL (ref 12.0–15.0)
MCH: 30.1 pg (ref 26.0–34.0)
MCHC: 33.2 g/dL (ref 30.0–36.0)
MCV: 90.8 fL (ref 80.0–100.0)
Platelets: 224 10*3/uL (ref 150–400)
RBC: 4.48 MIL/uL (ref 3.87–5.11)
RDW: 12.6 % (ref 11.5–15.5)
WBC: 6 10*3/uL (ref 4.0–10.5)
nRBC: 0 % (ref 0.0–0.2)

## 2019-09-13 LAB — COMPREHENSIVE METABOLIC PANEL
ALT: 35 U/L (ref 0–44)
AST: 32 U/L (ref 15–41)
Albumin: 4.5 g/dL (ref 3.5–5.0)
Alkaline Phosphatase: 59 U/L (ref 38–126)
Anion gap: 7 (ref 5–15)
BUN: 14 mg/dL (ref 6–20)
CO2: 31 mmol/L (ref 22–32)
Calcium: 9.8 mg/dL (ref 8.9–10.3)
Chloride: 105 mmol/L (ref 98–111)
Creatinine, Ser: 0.9 mg/dL (ref 0.44–1.00)
GFR calc Af Amer: >60 mL/min (ref >60)
GFR calc non Af Amer: >60 mL/min (ref >60)
Glucose, Bld: 98 mg/dL (ref 70–99)
Potassium: 4 mmol/L (ref 3.5–5.1)
Sodium: 143 mmol/L (ref 135–145)
Total Bilirubin: 0.6 mg/dL (ref 0.3–1.2)
Total Protein: 7.9 g/dL (ref 6.5–8.1)

## 2019-09-13 LAB — TROPONIN I (HIGH SENSITIVITY): Troponin I (High Sensitivity): 4 ng/L (ref <18)

## 2019-09-13 NOTE — ED Triage Notes (Signed)
Patient with complaint of left side chest pain times two days. Patient denies shortness of breath, nausea and vomiting. Patient states that she is having pain with her kidneys and bladder and feels like she is having difficulty urinating. Patient states that she self caths one time a day.

## 2019-09-14 ENCOUNTER — Emergency Department
Admission: EM | Admit: 2019-09-14 | Discharge: 2019-09-14 | Disposition: A | Discharge status: Home / Self Care | Payer: BC Managed Care – PPO | Attending: Emergency Medicine | Admitting: Emergency Medicine

## 2019-09-14 DIAGNOSIS — R109 Unspecified abdominal pain: Secondary | ICD-10-CM

## 2019-09-14 DIAGNOSIS — R0789 Other chest pain: Secondary | ICD-10-CM

## 2019-09-14 NOTE — ED Provider Notes (Addendum)
[facility] Emergency Department Provider Note  ____________________________________________  Time seen: Approximately 3:19 AM  I have reviewed the triage vital signs and the nursing notes.   HISTORY  Chief Complaint Chest Pain   HPI Cristina Wade is a 49 y.o. female history of asthma, endometriosis, gastritis, GERD, migraine headaches, urinary retention  who presents for evaluation of chest pain.  Patient describes 2 days of intermittent episodes of sharp left-sided chest pain.  She reports the episodes will come at random times, she will have them for several seconds and then they go away.  She had approximately 2 a day for the last 2 days.  No chest pain at this time.  No cough or shortness of breath.  No personal history of PE or DVT, no recent travel immobilization, no leg pain or swelling, no hemoptysis, no exogenous hormones.  Patient has an appointment with a cardiologist towards the end of the week.  This appointment was scheduled by her primary care doctor as a regular checkup.  Patient also complaining of pain in her bilateral kidneys and bladder.  She describes the pain as dull, intermittent, none at this time.  She has been followed by urology for incomplete bladder emptying.  She has been self cathing at home once a day for the last 3 weeks.  She denies dysuria, or hematuria, or abdominal pain.  Past Medical History:  Diagnosis Date  . Asthma   . Chronic pelvic pain in female 2018  . Cystitis   . Cystocele   . Endometrial polyp   . Endometriosis    per pt report but never seen during surg  . Frequent headaches   . Gastritis   . GERD (gastroesophageal reflux disease)    rare  . Gross hematuria   . History of kidney stones   . Microscopic hematuria   . Migraine   . Migraines   . Osteopenia   . Urinary disorder   . UTI (lower urinary tract infection)     Patient Active Problem List   Diagnosis Date Noted  . Urinary retention 08/12/2018  .  Anxiety 08/12/2018  . Elevated blood pressure reading without diagnosis of hypertension 07/12/2018  . Left leg pain 07/12/2018  . Acute left-sided low back pain 06/03/2018  . Cloudy urine 04/29/2018  . Chest pain 04/29/2018  . Frontal headache 04/27/2018  . Localized, primary osteoarthritis 02/07/2018  . Pelvic pain 01/30/2018  . Slow transit constipation 10/10/2017  . Bilateral impacted cerumen 11/28/2016  . Chronic bilateral low back pain without sciatica 11/28/2016  . Precordial pain 07/07/2016  . Right bundle branch block (RBBB) on electrocardiogram (ECG) 07/07/2016  . Chest tightness 06/27/2016  . Preventative health care 09/30/2015  . Encounter to establish care 09/28/2014  . Vestibular migraine 09/28/2014  . Encounter for screening colonoscopy 11/21/2013    Past Surgical History:  Procedure Laterality Date  . COLONOSCOPY N/A 11/05/2014   Procedure: COLONOSCOPY;  Surgeon: Scot Jun, MD;  Location: [facility] ENDOSCOPY;  Service: Endoscopy;  Laterality: N/A;  . COLONOSCOPY  11/2016   Dr. Mechele Collin  . CYSTOSCOPY  2007   with biopsy   . CYSTOSCOPY N/A 06/01/2016   Procedure: CYSTOSCOPY;  Surgeon: Vena Austria, MD;  Location: [facility] ORS;  Service: Gynecology;  Laterality: N/A;  . DIAGNOSTIC LAPAROSCOPY    . DILATATION & CURETTAGE/HYSTEROSCOPY WITH MYOSURE N/A 04/13/2015   Procedure: DILATATION & CURETTAGE/HYSTEROSCOPY WITH MYOSURE/POLYPECTOMY;  Surgeon: Elenora Fender Ward, MD;  Location: [facility] ORS;  Service: Gynecology;  Laterality:  N/A;  . DILATATION & CURETTAGE/HYSTEROSCOPY WITH MYOSURE N/A 06/01/2016   Procedure: DILATATION & CURETTAGE/HYSTEROSCOPY WITH MYOSURE;  Surgeon: Malachy Mood, MD;  Location: [facility] ORS;  Service: Gynecology;  Laterality: N/A;  . GUM SURGERY    . laproscopy  2007  . POLYPECTOMY     endometrial    Prior to Admission medications   Medication Sig Start Date End Date Taking? Authorizing Provider  Azelastine HCl 137 MCG/SPRAY SOLN SMARTSIG:2 Spray(s)  Both Nares Twice Daily PRN 07/21/19   [provider]  bisacodyl (DULCOLAX) 10 MG suppository Place 1 suppository (10 mg total) rectally as needed for moderate constipation. 08/29/19   Carrie Mew, MD  Calcium Carbonate-Vit D-Min (CALCIUM 1200 PO) Take 2-3 tablets by mouth daily. Reported on 05/26/2015    [provider]  polyethylene glycol powder (GLYCOLAX/MIRALAX) 17 GM/SCOOP powder 1 cap full in a full glass of water, two times a day for 3 days. 08/29/19   Carrie Mew, MD  senna-docusate (SENOKOT-S) 8.6-50 MG tablet Take 2 tablets by mouth 2 (two) times daily. 08/29/19   Carrie Mew, MD  tamsulosin (FLOMAX) 0.4 MG CAPS capsule Take 1 capsule (0.4 mg total) by mouth daily. 08/29/19   Zara Council A, PA-C    Allergies Lac bovis, Milk-related compounds, Omeprazole, and Penicillins  Family History  Problem Relation Age of Onset  . Colon cancer Father 34  . Arthritis Father   . Hyperlipidemia Father   . Transient ischemic attack Father   . Cancer - Colon Father 14  . Prostate cancer Father 18  . Valvular heart disease Father   . Arthritis Mother   . Heart disease Mother   . Stroke Mother        TIA  . Hypertension Mother   . Diabetes Mother   . Kidney cancer Mother 67  . Heart failure Mother   . Hyperlipidemia Mother   . Cancer Maternal Grandmother   . Cancer Paternal Grandfather        prostate  . Breast cancer Cousin 27       has contact    Social History Social History   Tobacco Use  . Smoking status: Never Smoker  . Smokeless tobacco: Never Used  Substance Use Topics  . Alcohol use: No  . Drug use: No    Review of Systems  Constitutional: Negative for fever. Eyes: Negative for visual changes. ENT: Negative for sore throat. Neck: No neck pain  Cardiovascular: + chest pain. Respiratory: Negative for shortness of breath. Gastrointestinal: + suprapubic abdominal pain. No vomiting or diarrhea. Genitourinary: Negative for dysuria. +  b/l flank pain Musculoskeletal: Negative for back pain. Skin: Negative for rash. Neurological: Negative for headaches, weakness or numbness. Psych: No SI or HI  ____________________________________________   PHYSICAL EXAM:  VITAL SIGNS: ED Triage Vitals  Enc Vitals Group     BP 09/13/19 2009 (!) 149/97     Pulse Rate 09/13/19 2009 71     Resp 09/13/19 2009 18     Temp 09/13/19 2009 98.2 F (36.8 C)     Temp Source 09/13/19 2009 Oral     SpO2 09/13/19 2009 99 %     Weight 09/13/19 2006 151 lb (68.5 kg)     Height 09/13/19 2006 5\' 10"  (1.778 m)     Head Circumference --      Peak Flow --      Pain Score 09/13/19 2006 6     Pain Loc --      Pain Edu? --  Excl. in GC? --     Constitutional: Alert and oriented. Well appearing and in no apparent distress. HEENT:      Head: Normocephalic and atraumatic.         Eyes: Conjunctivae are normal. Sclera is non-icteric.       Mouth/Throat: Mucous membranes are moist.       Neck: Supple with no signs of meningismus. Cardiovascular: Regular rate and rhythm. No murmurs, gallops, or rubs.  Respiratory: Normal respiratory effort. Lungs are clear to auscultation bilaterally. No wheezes, crackles, or rhonchi.  Gastrointestinal: Soft, non tender, and non distended with positive bowel sounds. No rebound or guarding. Genitourinary: No CVA tenderness. Musculoskeletal: Nontender with normal range of motion in all extremities. No edema, cyanosis, or erythema of extremities. Neurologic: Normal speech and language. Face is symmetric. Moving all extremities. No gross focal neurologic deficits are appreciated. Skin: Skin is warm, dry and intact. No rash noted. Psychiatric: Mood and affect are normal. Speech and behavior are normal.  ____________________________________________   LABS (all labs ordered are listed, but only abnormal results are displayed)  Labs Reviewed  URINALYSIS, COMPLETE (UACMP) WITH MICROSCOPIC - Abnormal; Notable for  the following components:      Result Value   Color, Urine YELLOW (*)    APPearance CLOUDY (*)    Specific Gravity, Urine 1.004 (*)    Bacteria, UA RARE (*)    All other components within normal limits  CBC  COMPREHENSIVE METABOLIC PANEL  TROPONIN I (HIGH SENSITIVITY)  TROPONIN I (HIGH SENSITIVITY)   ____________________________________________  EKG  ED ECG REPORT I, Nita Sickle, the attending physician, personally viewed and interpreted this ECG.  Normal sinus rhythm, rate of 71, normal intervals, normal axis, anterior Q waves, no ST elevations or depressions.  Unchanged from prior. ____________________________________________  RADIOLOGY  I have personally reviewed the images performed during this visit and I agree with the Radiologist's read.   Interpretation by Radiologist:  DG Chest 2 View  Result Date: 09/13/2019 CLINICAL DATA:  49 year old female with acute chest pain for 2 days. EXAM: CHEST - 2 VIEW COMPARISON:  07/14/2019 and prior exams FINDINGS: The cardiomediastinal silhouette is unremarkable. There is no evidence of focal airspace disease, pulmonary edema, suspicious pulmonary nodule/mass, pleural effusion, or pneumothorax. No acute bony abnormalities are identified. IMPRESSION: No active cardiopulmonary disease. Electronically Signed   By: Harmon Pier M.D.   On: 09/13/2019 20:33     ____________________________________________   PROCEDURES  Procedure(s) performed: yes .1-3 Lead EKG Interpretation Performed by: Nita Sickle, MD Authorized by: Nita Sickle, MD     Interpretation: normal     ECG rate assessment: normal     Rhythm: sinus rhythm     Ectopy: none     Critical Care performed:  None ____________________________________________   INITIAL IMPRESSION / ASSESSMENT AND PLAN / ED COURSE   49 y.o. female history of asthma, endometriosis, gastritis, GERD, migraine headaches, urinary retention  who presents for evaluation of chest  pain, b/l flank pain, suprapubic pain.  Patient is extremely well-appearing and in no distress, she has normal vital signs, abdomen is soft with no tenderness, no flank tenderness.  She has no chest pain, flank pain, or suprapubic pain at this time.  Chest sounds atypical for ACS however patient did undergo an EKG and a high-sensitivity troponin which were both negative.  Chest x-ray showed no evidence of pneumothorax, edema, pneumonia.  No signs of myocarditis or pericarditis.  Patient does have an appointment with cardiology towards the end  of the week which I recommended maintaining. PERC negative.  Flank pain and suprapubic pain has been intermittent for the last several weeks.  She is currently being evaluated by urology for incomplete bladder emptying.  She has been self cathing at home.  Her UA here shows no signs of infection.  Stable kidney function.  Labs showing no leukocytosis.  Patient is asymptomatic at this time.  Recommended close follow-up with urology for further evaluation.  Discussed my standard return precautions with patient.  Old medical records reviewed.       _____________________________________________ Please note:  Patient was evaluated in Emergency Department today for the symptoms described in the history of present illness. Patient was evaluated in the context of the global COVID-19 pandemic, which necessitated consideration that the patient might be at risk for infection with the SARS-CoV-2 virus that causes COVID-19. Institutional protocols and algorithms that pertain to the evaluation of patients at risk for COVID-19 are in a state of rapid change based on information released by regulatory bodies including the CDC and federal and state organizations. These policies and algorithms were followed during the patient's care in the ED.  Some ED evaluations and interventions may be delayed as a result of limited staffing during the pandemic.   [place] Controlled Substance  Database was reviewed by me. ____________________________________________   FINAL CLINICAL IMPRESSION(S) / ED DIAGNOSES   Final diagnoses:  Atypical chest pain  Flank pain      NEW MEDICATIONS STARTED DURING THIS VISIT:  ED Discharge Orders    None       Note:  This document was prepared using Dragon voice recognition software and may include unintentional dictation errors.    Don Perking, [place], MD 09/14/19 0737    Nita Sickle, MD 09/14/19 [phone]

## 2019-09-14 NOTE — ED Notes (Signed)
Patient reports having left sided chest pain off/on for 2 days.  Right now rates pain 2/10, described as sharpe in nature.  Patient also reports kidney and bladder pain, reports had indwelling foley cath for approximately 1 week for urinary retention and now self caths daily to empty residual urine from bladder.  Patient resting quietly at this time, patient is in now acute distress.Marland Kitchen

## 2019-09-16 ENCOUNTER — Telehealth: Payer: Self-pay

## 2019-09-16 NOTE — Telephone Encounter (Signed)
Patient complains of burning and pain in the bladder and kidneys. Patient rates the pain as a 7 out of 10. Patient denies any fever, chills, nausea or vomiting.  Patient states she was seen in the ED on Saturday 09/14/19 for chest pain, kidney and bladder pain. She was told to follow up with her urologist. Please advise.

## 2019-09-16 NOTE — Telephone Encounter (Signed)
Would you ask Cristina Wade how many times a day she is cathing herself and how much she gets out each time?

## 2019-09-16 NOTE — Telephone Encounter (Signed)
LMOM for patient to return call.

## 2019-09-17 ENCOUNTER — Encounter: Payer: Self-pay | Admitting: Urology

## 2019-09-17 ENCOUNTER — Other Ambulatory Visit: Payer: Self-pay

## 2019-09-17 ENCOUNTER — Ambulatory Visit: Payer: BC Managed Care – PPO | Admitting: Urology

## 2019-09-17 VITALS — BP 127/82 | HR 81 | Ht 70.0 in | Wt 151.0 lb

## 2019-09-17 DIAGNOSIS — R339 Retention of urine, unspecified: Secondary | ICD-10-CM

## 2019-09-17 LAB — URINALYSIS, COMPLETE
Bilirubin, UA: NEGATIVE
Glucose, UA: NEGATIVE
Ketones, UA: NEGATIVE
Leukocytes,UA: NEGATIVE
Nitrite, UA: NEGATIVE
Protein,UA: NEGATIVE
Specific Gravity, UA: 1.02 (ref 1.005–1.030)
Urobilinogen, Ur: 0.2 mg/dL (ref 0.2–1.0)
pH, UA: 7.5 (ref 5.0–7.5)

## 2019-09-17 LAB — MICROSCOPIC EXAMINATION

## 2019-09-17 LAB — BLADDER SCAN AMB NON-IMAGING: Scan Result: 0

## 2019-09-17 NOTE — Telephone Encounter (Signed)
We should have her come in and check a PVR.

## 2019-09-17 NOTE — Telephone Encounter (Signed)
Spoke to patient and she states she has been doing it 1 time daily at night and 1 night she cathed again in the middle of the night. She is not getting much output when she urinates or when she uses the catheter. She is concerned there be a blockage in her Kidneys because her kidney have been hurting.

## 2019-09-17 NOTE — Progress Notes (Signed)
Patient presents today with concern of not much urine output. A PVR was performed with a residual of 0.  Patient states she has been doing catheterization every evening before bed. She states only a little amount comes out. I asked her how much water she is drinking. She states she has 2 24 oz bottles of water on most days but not every day. Sometimes she drinks less. I expressed she should drink 3 of the 24 oz bottles of water each day consistantly every day. I informed her that her organs blood and bowels need to absorb water and if she is not getting enough water in then her body will take what it needs and not expel much. I asked her if she is still constipated. She states she is constipated and has not had a bowel movement in 3 days. I informed her that if she is not drinking enough fluids her stool will harden and make it difficult to have a bowel movement. Also constipation can make her feel like her bladder is not getting empty as the stool can push on the bladder.   I spoke to [facility] about her cathing daily and she would like her to stop and we will recheck the PVR at her next office visit.   I informed patient to stop using the catheter daily. She voiced understanding. She is to contact our office if she is not urinating at all and her stomach starts distending like she is urinary retention. Patient voiced understanding.

## 2019-09-17 NOTE — Telephone Encounter (Signed)
Patient notified and voiced understanding.  Appointment scheduled.

## 2019-09-17 NOTE — Addendum Note (Signed)
Addended by: Janae Bridgeman on: 09/17/2019 04:51 PM   Modules accepted: Orders

## 2019-09-18 ENCOUNTER — Other Ambulatory Visit: Payer: Self-pay

## 2019-09-18 ENCOUNTER — Ambulatory Visit (INDEPENDENT_AMBULATORY_CARE_PROVIDER_SITE_OTHER): Payer: BC Managed Care – PPO | Admitting: Internal Medicine

## 2019-09-18 ENCOUNTER — Encounter: Payer: Self-pay | Admitting: Internal Medicine

## 2019-09-18 VITALS — BP 110/70 | HR 69 | Ht 70.0 in | Wt 153.2 lb

## 2019-09-18 DIAGNOSIS — R079 Chest pain, unspecified: Secondary | ICD-10-CM

## 2019-09-18 DIAGNOSIS — R0609 Other forms of dyspnea: Secondary | ICD-10-CM

## 2019-09-18 DIAGNOSIS — R0601 Orthopnea: Secondary | ICD-10-CM | POA: Diagnosis not present

## 2019-09-18 DIAGNOSIS — R0600 Dyspnea, unspecified: Secondary | ICD-10-CM | POA: Diagnosis not present

## 2019-09-18 NOTE — Progress Notes (Signed)
Follow-up Outpatient Visit Date: 09/18/2019  Primary Care Provider: Burnard Hawthorne, FNP [address] [place] [address]  Chief Complaint: Chest pain  HPI:  Cristina Wade is a 49 y.o. female with history of asthma, GERD, and migraine headaches, who presents for follow-up of chest pain.  I saw her in 06/2018 for evaluation of atypical chest pain.  She wished to defer ischemia testing at that time.  She presented to the [facility] Emergency Department last week complaining of intermittent sharp, left-sided chest pain.  ED workup was unremarkable.  Today, Cristina Wade notes that she has been having 2 distinct types of chest pain.  She has chronic intermittent central chest tightness that has been present for years.  It seems to occur mostly when she exerts herself.  Over the last week, she is also experienced sharp left-sided chest pain that happens randomly.  This is what led her to go to the emergency department last week.  Cristina Wade also notes intermittent shortness of breath as well as mild orthopnea.  She states that she is unable to lie completely flat but feels fine when using one pillow.  Cristina Wade cannot think of any clear precipitants for her recent chest pain.  However, she wonders if there may be some relationship with difficulty voiding.  She was recently started on tamsulosin by urology with improvement in her urinary issues.  She notes that her chest wall is tender when she presses firmly, but is uncertain if this reproduces either of her 2 types of chest pain.  She notes some intermittent acid reflux, which is not new.  She has not had any palpitations, lightheadedness, or edema.  --------------------------------------------------------------------------------------------------  Cardiovascular History & Procedures: Cardiovascular Problems:  Chest pain  Risk Factors:  None  Cath/PCI:  None  CV Surgery:  None  EP Procedures and Devices:  None  Non-Invasive  Evaluation(s):  None available; patient reports negative stress test ~5 years ago  Recent CV Pertinent Labs: Lab Results  Component Value Date   CHOL 178 11/29/2016   HDL 50.60 11/29/2016   LDLCALC 113 (H) 11/29/2016   TRIG 70.0 11/29/2016   CHOLHDL 4 11/29/2016   K 4.0 09/13/2019   BUN 14 09/13/2019   CREATININE 0.90 09/13/2019    Past medical and surgical history were reviewed and updated in EPIC.  Current Meds  Medication Sig  . Azelastine HCl 137 MCG/SPRAY SOLN SMARTSIG:2 Spray(s) Both Nares Twice Daily PRN  . bisacodyl (DULCOLAX) 10 MG suppository Place 1 suppository (10 mg total) rectally as needed for moderate constipation.  . Calcium Carbonate-Vit D-Min (CALCIUM 1200 PO) Take 2-3 tablets by mouth daily. Reported on 05/26/2015  . polyethylene glycol powder (GLYCOLAX/MIRALAX) 17 GM/SCOOP powder 1 cap full in a full glass of water, two times a day for 3 days.  Marland Kitchen senna-docusate (SENOKOT-S) 8.6-50 MG tablet Take 2 tablets by mouth 2 (two) times daily.  . tamsulosin (FLOMAX) 0.4 MG CAPS capsule Take 1 capsule (0.4 mg total) by mouth daily.    Allergies: Lac bovis, Milk-related compounds, Omeprazole, and Penicillins  Social History   Tobacco Use  . Smoking status: Never Smoker  . Smokeless tobacco: Never Used  Substance Use Topics  . Alcohol use: No  . Drug use: No    Family History  Problem Relation Age of Onset  . Colon cancer Father 19  . Arthritis Father   . Hyperlipidemia Father   . Transient ischemic attack Father   . Cancer - Colon Father  55  . Prostate cancer Father 67  . Valvular heart disease Father   . Arthritis Mother   . Heart disease Mother   . Stroke Mother        TIA  . Hypertension Mother   . Diabetes Mother   . Kidney cancer Mother 7  . Heart failure Mother   . Hyperlipidemia Mother   . Cancer Maternal Grandmother   . Cancer Paternal Grandfather        prostate  . Breast cancer Cousin 35       has contact    Review of Systems: A  12-system review of systems was performed and was negative except as noted in the HPI.  --------------------------------------------------------------------------------------------------  Physical Exam: BP 110/70 (BP Location: Left Arm, Patient Position: Sitting, Cuff Size: Normal)   Pulse 69   Ht 5\' 10"  (1.778 m)   Wt 153 lb 4 oz (69.5 kg)   SpO2 97%   BMI 21.99 kg/m   General: NAD. Neck: No JVD or HJR. Lungs: Clear to auscultation bilaterally without wheezes or crackles. Heart: Regular rate and rhythm without murmurs, rubs, or gallops. Abdomen: Soft, nontender, nondistended. Extremities: Trace pretibial edema bilaterally.  2+ radial and pedal pulses.  EKG: Normal sinus rhythm with sinus arrhythmia.  No significant abnormality.  Lab Results  Component Value Date   WBC 6.0 09/13/2019   HGB 13.5 09/13/2019   HCT 40.7 09/13/2019   MCV 90.8 09/13/2019   PLT 224 09/13/2019    Lab Results  Component Value Date   NA 143 09/13/2019   K 4.0 09/13/2019   CL 105 09/13/2019   CO2 31 09/13/2019   BUN 14 09/13/2019   CREATININE 0.90 09/13/2019   GLUCOSE 98 09/13/2019   ALT 35 09/13/2019    Lab Results  Component Value Date   CHOL 178 11/29/2016   HDL 50.60 11/29/2016   LDLCALC 113 (H) 11/29/2016   TRIG 70.0 11/29/2016   CHOLHDL 4 11/29/2016    --------------------------------------------------------------------------------------------------  ASSESSMENT AND PLAN: Chest pain: Cristina Wade reports 2 distinct types of chest pain.  Her chronic exertional chest discomfort is concerning for angina, though prior exercise tolerance test years ago for evaluation of the same discomfort was reportedly negative.  Chest discomfort over the last week is more atypical and suggestive of a noncardiac etiology.  Recent ED work-up was unrevealing.  Examination and EKG today are largely unremarkable other than trace pretibial edema.  I recommended that we obtain an echocardiogram and exercise  tolerance test for further evaluation.  Ms. take and try using an over-the-counter NSAID for symptomatic relief.  Dyspnea on exertion and orthopnea: Chronic and ill-defined.  Examination today is notable for trace pedal edema but otherwise no evidence of heart failure.  I recommended that we obtain a transthoracic echocardiogram for further assessment.  Follow-up: Return to clinic in 3 months.  Arlana Pouch, MD 09/18/2019 2:51 PM

## 2019-09-18 NOTE — Patient Instructions (Addendum)
Medication Instructions:  Your physician has recommended you make the following change in your medication: Take OTC Aleve twice a day. Take OTC Acid Reducer daily to see if this will help reduce the chest pain you are having.   *If you need a refill on your cardiac medications before your next appointment, please call your pharmacy*   Lab Work: NONE   If you have labs (blood work) drawn today and your tests are completely normal, you will receive your results only by: Marland Kitchen MyChart Message (if you have MyChart) OR . A paper copy in the mail If you have any lab test that is abnormal or we need to change your treatment, we will call you to review the results.  Testing/Procedures:  1- Echocardiogram- Your physician has requested that you have an echocardiogram. Echocardiography is a painless test that uses sound waves to create images of your heart. It provides your doctor with information about the size and shape of your heart and how well your heart's chambers and valves are working. This procedure takes approximately one hour. There are no restrictions for this procedure. You may receive an IV with an ultrasound enhancement agent to better visualize your heart if the technologist feels this is appropriate.     2- Exercise Stress Test - Your physician has requested that you have an exercise tolerance test. For further information please visit [email or web address] Please also follow instruction sheet, as given.   Do not have any drinks or foods that have caffeine in them for 24 hours before the test, or as told by your doctor. This includes coffee, tea (even decaf tea), sodas, chocolate, and cocoa.  If you use an inhaler, bring it with you to the test.  Do not put lotions, powders, creams, or oils on your chest before the test.  Wear comfortable shoes and clothing.  Do not use any products that have nicotine or tobacco in them, such as cigarettes and e-cigarettes. Stop using them at least 4  hours before the test. If you need help quitting, ask your doctor.   Follow Up: We recommend signing up for the patient portal called "MyChart".  Sign up information is provided on this After Visit Summary.  MyChart is used to connect with patients for Virtual Visits (Telemedicine).  Patients are able to view lab/test results, encounter notes, upcoming appointments, etc.  Non-urgent messages can be sent to your provider as well.   To learn more about what you can do with MyChart, go to [email or web address].    Your next appointment:   3 month(s)  The format for your next appointment:   In Person  Provider:    You may see or one of the following Advanced Practice Providers on your designated Care Team:    Nicolasa Ducking, NP  Eula Listen, PA-C  Marisue Ivan, PA-C    Exercise Stress Test An exercise stress test is a test to check how your heart works during exercise. You will need to walk on a treadmill or ride an exercise bike for this test. An electrocardiogram (ECG) will record your heartbeat when you are at rest and when you are exercising. You may have an ultrasound or nuclear test after the exercise test. The test is done to check for coronary artery disease (CAD). It is also done to:  See how well you can exercise.  Watch for high blood pressure during exercise.  Test how well you can exercise after treatment.  Check the blood flow to your arms  and legs. If your test result is not normal, more testing may be needed. What happens before the procedure?  Follow instructions from your doctor about what you cannot eat or drink. ? Do not have any drinks or foods that have caffeine in them for 24 hours before the test, or as told by your doctor. This includes coffee, tea (even decaf tea), sodas, chocolate, and cocoa.  Ask your doctor about changing or stopping your normal medicines. This is important if you: ? Take diabetes medicines. ? Take beta-blocker  medicines. ? Wear a nitroglycerin patch.  If you use an inhaler, bring it with you to the test.  Do not put lotions, powders, creams, or oils on your chest before the test.  Wear comfortable shoes and clothing.  Do not use any products that have nicotine or tobacco in them, such as cigarettes and e-cigarettes. Stop using them at least 4 hours before the test. If you need help quitting, ask your doctor. What happens during the procedure?   Patches (electrodes) will be put on your chest.  Wires will be connected to the patches. The wires will send signals to a machine to record your heartbeat.  Your heart rate will be watched while you are resting and while you are exercising. Your blood pressure will also be watched during the test.  You will walk on a treadmill or use a stationary bike. If you cannot use these, you may be asked to turn a crank with your hands.  The activity will get harder and will raise your heart rate.  You may be asked to breathe into a tube a few times during the test. This measures the gases that you breathe out.  You will be asked how you are feeling throughout the test.  You will exercise until your heart reaches a target heart rate. You will stop early if: ? You feel dizzy. ? You have chest pain. ? You are out of breath. ? Your blood pressure is too high or too low. ? You have an irregular heartbeat. ? You have pain or aching in your arms or legs. The procedure may vary among doctors and hospitals. What happens after the procedure?  Your blood pressure, heart rate, breathing rate, and blood oxygen level will be watched after the test.  You may return to your normal diet and activities as told by your doctor.  It is up to you to get the results of your test. Ask your doctor, or the department that is doing the test, when your results will be ready. Summary  An exercise stress test is a test to check how your heart works during exercise.  This test  is done to check for coronary artery disease.  Your heart rate will be watched while you are resting and while you are exercising.  Follow instructions from your doctor about what you cannot eat or drink before the test. This information is not intended to replace advice given to you by your health care provider. Make sure you discuss any questions you have with your health care provider. Document Revised: 07/16/2018 Document Reviewed: 07/04/2016 Elsevier Patient Education  [place].     Echocardiogram An echocardiogram is a procedure that uses painless sound waves (ultrasound) to produce an image of the heart. Images from an echocardiogram can provide important information about:  Signs of coronary artery disease (CAD).  Aneurysm detection. An aneurysm is a weak or damaged part of an artery wall that bulges out  from the normal force of blood pumping through the body.  Heart size and shape. Changes in the size or shape of the heart can be associated with certain conditions, including heart failure, aneurysm, and CAD.  Heart muscle function.  Heart valve function.  Signs of a past heart attack.  Fluid buildup around the heart.  Thickening of the heart muscle.  A tumor or infectious growth around the heart valves. Tell a health care provider about:  Any allergies you have.  All medicines you are taking, including vitamins, herbs, eye drops, creams, and over-the-counter medicines.  Any blood disorders you have.  Any surgeries you have had.  Any medical conditions you have.  Whether you are pregnant or may be pregnant. What are the risks? Generally, this is a safe procedure. However, problems may occur, including:  Allergic reaction to dye (contrast) that may be used during the procedure. What happens before the procedure? No specific preparation is needed. You may eat and drink normally. What happens during the procedure?   An IV tube may be inserted into  one of your veins.  You may receive contrast through this tube. A contrast is an injection that improves the quality of the pictures from your heart.  A gel will be applied to your chest.  A wand-like tool (transducer) will be moved over your chest. The gel will help to transmit the sound waves from the transducer.  The sound waves will harmlessly bounce off of your heart to allow the heart images to be captured in real-time motion. The images will be recorded on a computer. The procedure may vary among health care providers and hospitals. What happens after the procedure?  You may return to your normal, everyday life, including diet, activities, and medicines, unless your health care provider tells you not to do that. Summary  An echocardiogram is a procedure that uses painless sound waves (ultrasound) to produce an image of the heart.  Images from an echocardiogram can provide important information about the size and shape of your heart, heart muscle function, heart valve function, and fluid buildup around your heart.  You do not need to do anything to prepare before this procedure. You may eat and drink normally.  After the echocardiogram is completed, you may return to your normal, everyday life, unless your health care provider tells you not to do that. This information is not intended to replace advice given to you by your health care provider. Make sure you discuss any questions you have with your health care provider. Document Revised: 07/25/2018 Document Reviewed: 05/06/2016 Elsevier Patient Education  2020 [employer].

## 2019-09-20 ENCOUNTER — Encounter: Payer: Self-pay | Admitting: Internal Medicine

## 2019-09-20 DIAGNOSIS — R0602 Shortness of breath: Secondary | ICD-10-CM | POA: Insufficient documentation

## 2019-09-20 DIAGNOSIS — R0609 Other forms of dyspnea: Secondary | ICD-10-CM | POA: Insufficient documentation

## 2019-09-20 DIAGNOSIS — R0601 Orthopnea: Secondary | ICD-10-CM | POA: Insufficient documentation

## 2019-09-24 ENCOUNTER — Ambulatory Visit: Payer: BC Managed Care – PPO | Admitting: Family

## 2019-09-24 ENCOUNTER — Other Ambulatory Visit: Payer: Self-pay

## 2019-09-24 ENCOUNTER — Other Ambulatory Visit (INDEPENDENT_AMBULATORY_CARE_PROVIDER_SITE_OTHER): Payer: BC Managed Care – PPO

## 2019-09-24 DIAGNOSIS — F419 Anxiety disorder, unspecified: Secondary | ICD-10-CM

## 2019-09-24 LAB — COMPREHENSIVE METABOLIC PANEL
ALT: 27 U/L (ref 0–35)
AST: 20 U/L (ref 0–37)
Albumin: 4.7 g/dL (ref 3.5–5.2)
Alkaline Phosphatase: 58 U/L (ref 39–117)
BUN: 14 mg/dL (ref 6–23)
CO2: 29 mEq/L (ref 19–32)
Calcium: 9.9 mg/dL (ref 8.4–10.5)
Chloride: 104 mEq/L (ref 96–112)
Creatinine, Ser: 0.81 mg/dL (ref 0.40–1.20)
GFR: 74.53 mL/min (ref >60.00)
Glucose, Bld: 86 mg/dL (ref 70–99)
Potassium: 3.8 mEq/L (ref 3.5–5.1)
Sodium: 140 mEq/L (ref 135–145)
Total Bilirubin: 0.6 mg/dL (ref 0.2–1.2)
Total Protein: 7.7 g/dL (ref 6.0–8.3)

## 2019-09-24 LAB — LIPID PANEL
Cholesterol: 194 mg/dL (ref 0–200)
HDL: 67.7 mg/dL (ref >39.00)
LDL Cholesterol: 116 mg/dL — ABNORMAL HIGH (ref 0–99)
NonHDL: 125.96
Total CHOL/HDL Ratio: 3
Triglycerides: 49 mg/dL (ref 0.0–149.0)
VLDL: 9.8 mg/dL (ref 0.0–40.0)

## 2019-09-24 LAB — HEMOGLOBIN A1C: Hgb A1c MFr Bld: 5.6 % (ref 4.6–6.5)

## 2019-09-25 ENCOUNTER — Encounter: Payer: Self-pay | Admitting: Urology

## 2019-09-25 ENCOUNTER — Ambulatory Visit (INDEPENDENT_AMBULATORY_CARE_PROVIDER_SITE_OTHER): Payer: BC Managed Care – PPO | Admitting: Urology

## 2019-09-25 VITALS — BP 123/81 | HR 76 | Ht 70.0 in | Wt 151.0 lb

## 2019-09-25 DIAGNOSIS — R339 Retention of urine, unspecified: Secondary | ICD-10-CM

## 2019-09-25 DIAGNOSIS — R3989 Other symptoms and signs involving the genitourinary system: Secondary | ICD-10-CM | POA: Diagnosis not present

## 2019-09-25 LAB — MICROSCOPIC EXAMINATION

## 2019-09-25 LAB — URINALYSIS, COMPLETE
Bilirubin, UA: NEGATIVE
Glucose, UA: NEGATIVE
Ketones, UA: NEGATIVE
Leukocytes,UA: NEGATIVE
Nitrite, UA: NEGATIVE
Protein,UA: NEGATIVE
RBC, UA: NEGATIVE
Specific Gravity, UA: 1.02 (ref 1.005–1.030)
Urobilinogen, Ur: 0.2 mg/dL (ref 0.2–1.0)
pH, UA: 7.5 (ref 5.0–7.5)

## 2019-09-25 LAB — BLADDER SCAN AMB NON-IMAGING: Scan Result: 0

## 2019-09-25 MED ORDER — TAMSULOSIN HCL 0.4 MG PO CAPS: 0.4000 mg | ORAL_CAPSULE | Freq: Every day | ORAL | 12 refills | Status: DC

## 2019-09-25 NOTE — Progress Notes (Signed)
09/25/2019 2:39 PM   [name] 03-16-1971 [id number]  Referring provider: [name], FNP [address] [place],  [place] [address]  Chief Complaint  Patient presents with  . Urinary Retention    HPI: Mrs. Belson is a 49 year old female with chronic constipation, rUTI's, IC, dysfunctional voiding, renal cyst and nephrolithiasis who presents today follow up for incomplete bladder emptying.    She presented to [place] back in May after visiting the [facility] clinic acute care office for symptoms of urinary frequency, odor, low back and pelvic pain x2 to 3 weeks.  She was prescribed Cipro.  Urine culture grew out Citrobacter freundii.  She is found to have a PVR 562 mL.  A Foley catheter was placed.  She then failed her trial of void and was instructed on self-catheterization technique.  Her residuals continue to diminish over the last several days and she discontinued her self-cathing.    Today, she states she is having lower back pain and suprapubic pain. She denies any frequency, urgency or dysuria.  Patient denies any modifying or aggravating factors.  Patient denies any gross hematuria, dysuria or suprapubic/flank pain.  Patient denies any fevers, chills, nausea or vomiting.  Her UA today with 6-10 WBC's, granular and hyaline casts and moderate bacteria.  Her PVR is 0 mL.     PMH: Past Medical History:  Diagnosis Date  . Asthma   . Chronic pelvic pain in female 2018  . Cystitis   . Cystocele   . Endometrial polyp   . Endometriosis    per pt report but never seen during surg  . Frequent headaches   . Gastritis   . GERD (gastroesophageal reflux disease)    rare  . Gross hematuria   . History of kidney stones   . Microscopic hematuria   . Migraine   . Migraines   . Osteopenia   . Urinary disorder   . UTI (lower urinary tract infection)     Surgical History: Past Surgical History:  Procedure Laterality Date  . COLONOSCOPY N/A 11/05/2014    Procedure: COLONOSCOPY;  Surgeon: Scot Jun, MD;  Location: [facility] ENDOSCOPY;  Service: Endoscopy;  Laterality: N/A;  . COLONOSCOPY  11/2016   Dr. Mechele Collin  . CYSTOSCOPY  2007   with biopsy   . CYSTOSCOPY N/A 06/01/2016   Procedure: CYSTOSCOPY;  Surgeon: Vena Austria, MD;  Location: [facility] ORS;  Service: Gynecology;  Laterality: N/A;  . DIAGNOSTIC LAPAROSCOPY    . DILATATION & CURETTAGE/HYSTEROSCOPY WITH MYOSURE N/A 04/13/2015   Procedure: DILATATION & CURETTAGE/HYSTEROSCOPY WITH MYOSURE/POLYPECTOMY;  Surgeon: Elenora Fender Ward, MD;  Location: [facility] ORS;  Service: Gynecology;  Laterality: N/A;  . DILATATION & CURETTAGE/HYSTEROSCOPY WITH MYOSURE N/A 06/01/2016   Procedure: DILATATION & CURETTAGE/HYSTEROSCOPY WITH MYOSURE;  Surgeon: Vena Austria, MD;  Location: [facility] ORS;  Service: Gynecology;  Laterality: N/A;  . GUM SURGERY    . laproscopy  2007  . POLYPECTOMY     endometrial    Home Medications:  Allergies as of 09/25/2019      Reactions   Lac Bovis Other (See Comments)   Milk-related Compounds    Omeprazole Other (See Comments)   Pt reports "Chest pain, HA and Dysuria."   Penicillins Itching, Rash      Medication List       Accurate as of September 25, 2019  2:39 PM. If you have any questions, ask your nurse or doctor.        Azelastine  HCl 137 MCG/SPRAY Soln SMARTSIG:2 Spray(s) Both Nares Twice Daily PRN   bisacodyl 10 MG suppository Commonly known as: Dulcolax Place 1 suppository (10 mg total) rectally as needed for moderate constipation.   CALCIUM 1200 PO Take 2-3 tablets by mouth daily. Reported on 05/26/2015   polyethylene glycol powder 17 GM/SCOOP powder Commonly known as: GLYCOLAX/MIRALAX 1 cap full in a full glass of water, two times a day for 3 days.   senna-docusate 8.6-50 MG tablet Commonly known as: Senokot-S Take 2 tablets by mouth 2 (two) times daily.   tamsulosin 0.4 MG Caps capsule Commonly known as: FLOMAX Take 1 capsule (0.4 mg total) by mouth  daily.       Allergies:  Allergies  Allergen Reactions  . Lac Bovis Other (See Comments)  . Milk-Related Compounds   . Omeprazole Other (See Comments)    Pt reports "Chest pain, HA and Dysuria."  . Penicillins Itching and Rash    Family History: Family History  Problem Relation Age of Onset  . Colon cancer Father 7  . Arthritis Father   . Hyperlipidemia Father   . Transient ischemic attack Father   . Cancer - Colon Father 3  . Prostate cancer Father 81  . Valvular heart disease Father   . Arthritis Mother   . Heart disease Mother   . Stroke Mother        TIA  . Hypertension Mother   . Diabetes Mother   . Kidney cancer Mother 65  . Heart failure Mother   . Hyperlipidemia Mother   . Cancer Maternal Grandmother   . Cancer Paternal Grandfather        prostate  . Breast cancer Cousin 35       has contact    Social History:  reports that she has never smoked. She has never used smokeless tobacco. She reports that she does not drink alcohol and does not use drugs.  ROS: Pertinent ROS in HPI  Physical Exam: BP 123/81   Pulse 76   Ht 5\' 10"  (1.778 m)   Wt 151 lb (68.5 kg)   BMI 21.67 kg/m   Constitutional:  Well nourished. Alert and oriented, No acute distress. HEENT: [place] AT, mask in place.  Trachea midline Cardiovascular: No clubbing, cyanosis, or edema. Respiratory: Normal respiratory effort, no increased work of breathing. Neurologic: Grossly intact, no focal deficits, moving all 4 extremities. Psychiatric: Normal mood and affect.   Laboratory Data: Lab Results  Component Value Date   WBC 6.0 09/13/2019   HGB 13.5 09/13/2019   HCT 40.7 09/13/2019   MCV 90.8 09/13/2019   PLT 224 09/13/2019    Lab Results  Component Value Date   CREATININE 0.81 09/24/2019    Lab Results  Component Value Date   HGBA1C 5.6 09/24/2019    Lab Results  Component Value Date   TSH 1.46 06/03/2018       Component Value Date/Time   CHOL 194 09/24/2019 0831   HDL  67.70 09/24/2019 0831   CHOLHDL 3 09/24/2019 0831   VLDL 9.8 09/24/2019 0831   LDLCALC 116 (H) 09/24/2019 0831    Lab Results  Component Value Date   AST 20 09/24/2019   Lab Results  Component Value Date   ALT 27 09/24/2019    Urinalysis  Component     Latest Ref Rng & Units 09/25/2019  Specific Gravity, UA     1.005 - 1.030 1.020  pH, UA     5.0 - 7.5 7.5  Color, UA     Yellow Yellow  Appearance Ur     Clear Cloudy (A)  Leukocytes,UA     Negative Negative  Protein,UA     Negative/Trace Negative  Glucose, UA     Negative Negative  Ketones, UA     Negative Negative  RBC, UA     Negative Negative  Bilirubin, UA     Negative Negative  Urobilinogen, Ur     0.2 - 1.0 mg/dL 0.2  Nitrite, UA     Negative Negative  Microscopic Examination      See below:   Component     Latest Ref Rng & Units 09/25/2019  WBC, UA     0 - 5 /hpf 6-10 (A)  RBC     0 - 2 /hpf 0-2  Epithelial Cells (non renal)     0 - 10 /hpf 0-10  Renal Epithel, UA     None seen /hpf 0-10 (A)  Casts     None seen /lpf Present (A)  Cast Type     N/A Granular casts (A)  Crystals     N/A Present (A)  Crystal Type     N/A Amorphous Sediment  Bacteria, UA     None seen/Few Moderate (A)    I have reviewed the labs.   Pertinent Imaging: Results for TAKITA, RIECKE (MRN [id number]) as of 09/25/2019 14:47  Ref. Range 09/25/2019 14:25  Scan Result Unknown 0    I have independently reviewed the films.    Assessment & Plan:    1. Incomplete bladder emptying - Bladder Scan (Post Void Residual) in office -Resolved with treatment of constipation -Continue tamsulosin 0.4 mg daily, refill sent to pharmacy  2. Suprapubic pain -UA underwhelming today but will send for culture for completeness sake -If urine culture is positive we will treat and see if symptoms improve -If urine culture is negative or her symptoms do not improve after antibiotic she will have a follow-up with her gynecologist  Return  for pending urine culture results.  These notes generated with voice recognition software. I apologize for typographical errors.  Zara Council, PA-C  [facility] [address]  [place], [place] [address] [phone]

## 2019-09-29 ENCOUNTER — Telehealth: Payer: Self-pay

## 2019-09-29 ENCOUNTER — Telehealth: Payer: Self-pay | Admitting: Family Medicine

## 2019-09-29 LAB — CULTURE, URINE COMPREHENSIVE

## 2019-09-29 MED ORDER — CEPHALEXIN 500 MG PO CAPS: 500.0000 mg | ORAL_CAPSULE | Freq: Four times a day (QID) | ORAL | 0 refills | Status: DC

## 2019-09-29 NOTE — Telephone Encounter (Signed)
Let's get her set up for an appointment next week that way I can see how she is doing symptom wise.  I also need her sister to come to that appointment as well.  Edra also needs to see her gynecologist to make sure her ovaries are not contributing to her UTI symptoms.

## 2019-09-29 NOTE — Telephone Encounter (Signed)
Patient notified and scheduled for an appt next week

## 2019-09-29 NOTE — Telephone Encounter (Signed)
Patient called and would like to discuss with you why she has had 3 UTI's since May. Would you like to set up a telephone, virtual visit or in office to discuss further?

## 2019-09-29 NOTE — Telephone Encounter (Signed)
Patient notified and ABX sent to pharmacy.  

## 2019-09-29 NOTE — Telephone Encounter (Signed)
-----   Message from Harle Battiest, PA-C sent at 09/29/2019  7:54 AM EDT ----- Please let Ms. Buresh know that her urine culture was positive.  She need to start Keflex 500 mg QID x 7 days.

## 2019-09-29 NOTE — Telephone Encounter (Signed)
Pt reports being to the urologist for her UTI and they wanted her to ask [facility] if her recurring UTI's have anything to do with her ovary's. Please advise

## 2019-09-30 ENCOUNTER — Ambulatory Visit: Payer: BC Managed Care – PPO | Admitting: Nurse Practitioner

## 2019-09-30 ENCOUNTER — Ambulatory Visit: Payer: Self-pay | Admitting: Urology

## 2019-09-30 NOTE — Telephone Encounter (Signed)
I read urology note. They wanted her to follow up with [place] for pelvic pain if her urine culture was negative, but since it was positive, we don't need to see her.

## 2019-10-01 NOTE — Telephone Encounter (Signed)
Pt aware.

## 2019-10-04 DIAGNOSIS — M79605 Pain in left leg: Secondary | ICD-10-CM | POA: Insufficient documentation

## 2019-10-07 NOTE — Progress Notes (Signed)
10/08/2019 9:42 PM   Cristina Wade Apr 10, 1971 [id number]  Referring provider: [name], FNP [address] [place],  [place] [address]  Chief Complaint  Patient presents with   Follow-up    HPI: [name] is a 49 year old female with chronic constipation, rUTI's, IC, dysfunctional voiding, renal cyst, nephrolithiasis and incomplete bladder emptying with her sister, [name].  She is concerned about her recurrent urinary tract infections.  She states that sometimes she has an infection and does not even know it.    She is having suprapubic discomfort and lower back discomfort.  Patient denies any modifying or aggravating factors.  Patient denies any gross hematuria, dysuria or suprapubic/flank pain.  Patient denies any fevers, chills, nausea or vomiting.   Her UA today is negative.  Her PVR is 383 mL.    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  She has been undergoing PT and has an upcoming MRI.  PMH: Past Medical History:  Diagnosis Date   Asthma    Chronic pelvic pain in female 2018   Cystitis    Cystocele    Endometrial polyp    Endometriosis    per pt report but never seen during surg   Frequent headaches    Gastritis    GERD (gastroesophageal reflux disease)    rare   Gross hematuria    History of kidney stones    Microscopic hematuria    Migraine    Migraines    Osteopenia    Urinary disorder    UTI (lower urinary tract infection)     Surgical History: Past Surgical History:  Procedure Laterality Date   COLONOSCOPY N/A 11/05/2014   Procedure: COLONOSCOPY;  Surgeon: Cristina Jun, MD;  Location: [facility] ENDOSCOPY;  Service: Endoscopy;  Laterality: N/A;   COLONOSCOPY  11/2016   Dr. Mechele Wade   CYSTOSCOPY  2007   with biopsy    CYSTOSCOPY N/A 06/01/2016   Procedure: CYSTOSCOPY;  Surgeon: Cristina Austria, MD;  Location: [facility] ORS;  Service: Gynecology;  Laterality: N/A;   DIAGNOSTIC LAPAROSCOPY     DILATATION &  CURETTAGE/HYSTEROSCOPY WITH MYOSURE N/A 04/13/2015   Procedure: DILATATION & CURETTAGE/HYSTEROSCOPY WITH MYOSURE/POLYPECTOMY;  Surgeon: Cristina Fender Ward, MD;  Location: [facility] ORS;  Service: Gynecology;  Laterality: N/A;   DILATATION & CURETTAGE/HYSTEROSCOPY WITH MYOSURE N/A 06/01/2016   Procedure: DILATATION & CURETTAGE/HYSTEROSCOPY WITH MYOSURE;  Surgeon: Cristina Austria, MD;  Location: [facility] ORS;  Service: Gynecology;  Laterality: N/A;   GUM SURGERY     laproscopy  2007   POLYPECTOMY     endometrial    Home Medications:  Allergies as of 10/08/2019      Reactions   Lac Bovis Other (See Comments)   Milk-related Compounds    Omeprazole Other (See Comments)   Pt reports "Chest pain, HA and Dysuria."   Penicillins Itching, Rash      Medication List       Accurate as of October 08, 2019  9:42 PM. If you have any questions, ask your nurse or doctor.        STOP taking these medications   cephALEXin 500 MG capsule Commonly known as: Keflex Stopped by: Cristina Woerner, PA-C     TAKE these medications   Azelastine HCl 137 MCG/SPRAY Soln SMARTSIG:2 Spray(s) Both Nares Twice Daily PRN   bisacodyl 10 MG suppository Commonly known as: Dulcolax Place 1 suppository (10 mg total) rectally as needed for moderate constipation.   CALCIUM 1200 PO Take 2-3 tablets  by mouth daily. Reported on 05/26/2015   polyethylene glycol powder 17 GM/SCOOP powder Commonly known as: GLYCOLAX/MIRALAX 1 cap full in a full glass of water, two times a day for 3 days.   senna-docusate 8.6-50 MG tablet Commonly known as: Senokot-S Take 2 tablets by mouth 2 (two) times daily.   tamsulosin 0.4 MG Caps capsule Commonly known as: FLOMAX Take 1 capsule (0.4 mg total) by mouth daily.       Allergies:  Allergies  Allergen Reactions   Lac Bovis Other (See Comments)   Milk-Related Compounds    Omeprazole Other (See Comments)    Pt reports "Chest pain, HA and Dysuria."   Penicillins Itching and Rash     Family History: Family History  Problem Relation Age of Onset   Colon cancer Father 41   Arthritis Father    Hyperlipidemia Father    Transient ischemic attack Father    Cancer - Colon Father 65   Prostate cancer Father 23   Valvular heart disease Father    Arthritis Mother    Heart disease Mother    Stroke Mother        TIA   Hypertension Mother    Diabetes Mother    Kidney cancer Mother 70   Heart failure Mother    Hyperlipidemia Mother    Cancer Maternal Grandmother    Cancer Paternal Grandfather        prostate   Breast cancer Cousin 46       has contact    Social History:  reports that she has never smoked. She has never used smokeless tobacco. She reports that she does not drink alcohol and does not use drugs.  ROS: Pertinent ROS in HPI  Physical Exam: BP 131/71    Pulse 85    Ht 5\' 10"  (1.778 m)    Wt 152 lb (68.9 kg)    BMI 21.81 kg/m   Constitutional:  Well nourished. Alert and oriented, No acute distress. HEENT: [place] AT, mask in place.  Trachea midline Cardiovascular: No clubbing, cyanosis, or edema. Respiratory: Normal respiratory effort, no increased work of breathing. Neurologic: Grossly intact, no focal deficits, moving all 4 extremities. Psychiatric: Normal mood and affect.  Laboratory Data: Lab Results  Component Value Date   WBC 6.0 09/13/2019   HGB 13.5 09/13/2019   HCT 40.7 09/13/2019   MCV 90.8 09/13/2019   PLT 224 09/13/2019    Lab Results  Component Value Date   CREATININE 0.81 09/24/2019    Lab Results  Component Value Date   HGBA1C 5.6 09/24/2019    Lab Results  Component Value Date   TSH 1.46 06/03/2018       Component Value Date/Time   CHOL 194 09/24/2019 0831   HDL 67.70 09/24/2019 0831   CHOLHDL 3 09/24/2019 0831   VLDL 9.8 09/24/2019 0831   LDLCALC 116 (H) 09/24/2019 0831    Lab Results  Component Value Date   AST 20 09/24/2019   Lab Results  Component Value Date   ALT 27 09/24/2019    I have reviewed the labs.   Pertinent Imaging: Results for Cristina, Wade (MRN [id number]) as of 10/08/2019 21:27  Ref. Range 10/08/2019 14:38  Scan Result Unknown 320ml     I have independently reviewed the films.    Assessment & Plan:    1. Incomplete bladder emptying Bladder Scan (Post Void Residual) in office Patient continues to vacillate between incomplete bladder emptying a complete bladder emptying.  She is very  concerned about her bladder not emptying.  We will have her cath twice daily using female catheters as she has a difficult time manipulating the short female catheters.  She will record her residuals and we will call her in 1 week and review those results, so that we can evaluate her bladder over a longer period of time.  2.  Asymptomatic bacteriuria Explained to the patient that screening for, and then treating, asymptomatic bacteriuria is not recommended.  I asked her not to have her urine checked unless she is having symptoms of a urinary tract infection which would be, gross hematuria, dysuria and/or fever.  I explained by continually treating asymptomatic bacteriuria, she will develop multidrug-resistant organisms.    3. Back pain Explained to the patient that if she is found to have nerve impingement in her lumbar area it could contribute to her constipation and incomplete bladder emptying.  We will have to see how her further evaluation and possible treatment for sciatica affects her bowel and bladder functions.  4. Pelvic floor dysfunction Patient would like to be referred again for pelvic floor physical therapy as she feels she needs a refresher of her pelvic floor exercises   Return in about 1 week (around 10/15/2019) for I will call patient to review PVR's .  These notes generated with voice recognition software. I apologize for typographical errors.  Michiel Cowboy, PA-C  [facility] [address]  [address] [place], [place]  [address] [phone]  I spent 30 minutes on the day of the encounter to include pre-visit record review, face-to-face time with the patient, and post-visit ordering of tests.

## 2019-10-08 ENCOUNTER — Ambulatory Visit: Payer: BC Managed Care – PPO | Admitting: Adult Health

## 2019-10-08 ENCOUNTER — Ambulatory Visit (INDEPENDENT_AMBULATORY_CARE_PROVIDER_SITE_OTHER): Payer: BC Managed Care – PPO | Admitting: Urology

## 2019-10-08 ENCOUNTER — Encounter: Payer: Self-pay | Admitting: Urology

## 2019-10-08 ENCOUNTER — Other Ambulatory Visit: Payer: Self-pay

## 2019-10-08 VITALS — BP 131/71 | HR 85 | Ht 70.0 in | Wt 152.0 lb

## 2019-10-08 DIAGNOSIS — R8271 Bacteriuria: Secondary | ICD-10-CM | POA: Diagnosis not present

## 2019-10-08 DIAGNOSIS — M6289 Other specified disorders of muscle: Secondary | ICD-10-CM | POA: Diagnosis not present

## 2019-10-08 DIAGNOSIS — M545 Low back pain, unspecified: Secondary | ICD-10-CM

## 2019-10-08 DIAGNOSIS — R339 Retention of urine, unspecified: Secondary | ICD-10-CM

## 2019-10-08 LAB — BLADDER SCAN AMB NON-IMAGING

## 2019-10-09 ENCOUNTER — Ambulatory Visit: Payer: BC Managed Care – PPO | Admitting: Adult Health

## 2019-10-10 ENCOUNTER — Other Ambulatory Visit: Payer: Self-pay

## 2019-10-10 ENCOUNTER — Ambulatory Visit (INDEPENDENT_AMBULATORY_CARE_PROVIDER_SITE_OTHER): Payer: BC Managed Care – PPO | Admitting: Family

## 2019-10-10 ENCOUNTER — Encounter: Payer: Self-pay | Admitting: Family

## 2019-10-10 DIAGNOSIS — E785 Hyperlipidemia, unspecified: Secondary | ICD-10-CM | POA: Insufficient documentation

## 2019-10-10 NOTE — Patient Instructions (Addendum)
Nice to see you.   Lets focus on a low trans and saturated fat diet.   Lets recheck cholesterol again in 6 months as we discussed.

## 2019-10-10 NOTE — Assessment & Plan Note (Signed)
LDL cholesterol slightly elevated, overall patient is low cardiovascular risk.  I we did discuss at length her patient's mother's history of CVA, hyperlipidemia, and I did advise that it is very reasonable to start a low-dose cholesterol medication.  Patient politely  declines at this time as she was like to work on lifestyle changes.  Advised repeat cholesterol in 6 months

## 2019-10-10 NOTE — Progress Notes (Signed)
Subjective:    Patient ID: Cristina Wade, female    DOB: [birth year], 49 y.o.   MRN: [id number]  CC: Cristina Wade is a 49 y.o. female who presents today for follow up.   HPI: Feels well today  No new complaints.   Would like to discuss labs  Recently diagnosed with lower extremity bilaterally by neurology,  EMG study.  Following with Dr Sherryll Burger On b12.   Follows with GYN  Low back pain- currently doing PT and following with Cristina Wade  Continues to follow with urology, last appt 10/08/19. Advised to self cath for one week, notes urinary retention is improving HISTORY:  Past Medical History:  Diagnosis Date  . Asthma   . Chronic pelvic pain in female 2018  . Cystitis   . Cystocele   . Endometrial polyp   . Endometriosis    per pt report but never seen during surg  . Frequent headaches   . Gastritis   . GERD (gastroesophageal reflux disease)    rare  . Gross hematuria   . History of kidney stones   . Microscopic hematuria   . Migraine   . Migraines   . Osteopenia   . Urinary disorder   . UTI (lower urinary tract infection)    Past Surgical History:  Procedure Laterality Date  . COLONOSCOPY N/A 11/05/2014   Procedure: COLONOSCOPY;  Surgeon: Scot Jun, MD;  Location: [facility] ENDOSCOPY;  Service: Endoscopy;  Laterality: N/A;  . COLONOSCOPY  11/2016   Dr. Mechele Collin  . CYSTOSCOPY  2007   with biopsy   . CYSTOSCOPY N/A 06/01/2016   Procedure: CYSTOSCOPY;  Surgeon: Vena Austria, MD;  Location: [facility] ORS;  Service: Gynecology;  Laterality: N/A;  . DIAGNOSTIC LAPAROSCOPY    . DILATATION & CURETTAGE/HYSTEROSCOPY WITH MYOSURE N/A 04/13/2015   Procedure: DILATATION & CURETTAGE/HYSTEROSCOPY WITH MYOSURE/POLYPECTOMY;  Surgeon: Elenora Fender Ward, MD;  Location: [facility] ORS;  Service: Gynecology;  Laterality: N/A;  . DILATATION & CURETTAGE/HYSTEROSCOPY WITH MYOSURE N/A 06/01/2016   Procedure: DILATATION & CURETTAGE/HYSTEROSCOPY WITH MYOSURE;  Surgeon: Vena Austria, MD;  Location:  [facility] ORS;  Service: Gynecology;  Laterality: N/A;  . GUM SURGERY    . laproscopy  2007  . POLYPECTOMY     endometrial   Family History  Problem Relation Age of Onset  . Colon cancer Father 35  . Arthritis Father   . Hyperlipidemia Father   . Transient ischemic attack Father   . Cancer - Colon Father 28  . Prostate cancer Father 69  . Valvular heart disease Father   . Arthritis Mother   . Heart disease Mother   . Stroke Mother        TIA  . Hypertension Mother   . Diabetes Mother   . Kidney cancer Mother 60  . Heart failure Mother   . Hyperlipidemia Mother   . Cancer Maternal Grandmother   . Cancer Paternal Grandfather        prostate  . Breast cancer Cousin 35       has contact    Allergies: Lac bovis, Milk-related compounds, Omeprazole, and Penicillins Current Outpatient Medications on File Prior to Visit  Medication Sig Dispense Refill  . Azelastine HCl 137 MCG/SPRAY SOLN SMARTSIG:2 Spray(s) Both Nares Twice Daily PRN    . Calcium Carbonate-Vit D-Min (CALCIUM 1200 PO) Take 2-3 tablets by mouth daily. Reported on 05/26/2015    . Cyanocobalamin (B-12 PO) Take by mouth daily.    Marland Kitchen senna-docusate (SENOKOT-S) 8.6-50 MG tablet Take 2  tablets by mouth 2 (two) times daily. 120 tablet 0  . tamsulosin (FLOMAX) 0.4 MG CAPS capsule Take 1 capsule (0.4 mg total) by mouth daily. 30 capsule 12  . bisacodyl (DULCOLAX) 10 MG suppository Place 1 suppository (10 mg total) rectally as needed for moderate constipation. (Patient not taking: Reported on 10/10/2019) 20 suppository 0  . polyethylene glycol powder (GLYCOLAX/MIRALAX) 17 GM/SCOOP powder 1 cap full in a full glass of water, two times a day for 3 days. (Patient not taking: Reported on 10/10/2019) 255 g 0   No current facility-administered medications on file prior to visit.    Social History   Tobacco Use  . Smoking status: Never Smoker  . Smokeless tobacco: Never Used  Vaping Use  . Vaping Use: Never used  Substance Use Topics    . Alcohol use: No  . Drug use: No    Review of Systems  Constitutional: Negative for chills and fever.  Respiratory: Negative for cough.   Cardiovascular: Negative for chest pain and palpitations.  Gastrointestinal: Negative for nausea and vomiting.  Genitourinary: Positive for difficulty urinating (chronic).  Musculoskeletal: Positive for back pain (chronic).  Neurological: Positive for numbness and headaches.      Objective:    BP 102/66   Pulse 78   Temp 97.7 F (36.5 C) (Oral)   Ht 5\' 10"  (1.778 m)   Wt 148 lb (67.1 kg)   SpO2 98%   BMI 21.24 kg/m  BP Readings from Last 3 Encounters:  10/10/19 102/66  10/08/19 131/71  09/25/19 123/81   Wt Readings from Last 3 Encounters:  10/10/19 148 lb (67.1 kg)  10/08/19 152 lb (68.9 kg)  09/25/19 151 lb (68.5 kg)    Physical Exam Vitals reviewed.  Constitutional:      Appearance: She is well-developed.  Eyes:     Conjunctiva/sclera: Conjunctivae normal.  Cardiovascular:     Rate and Rhythm: Normal rate and regular rhythm.     Pulses: Normal pulses.     Heart sounds: Normal heart sounds.  Pulmonary:     Effort: Pulmonary effort is normal.     Breath sounds: Normal breath sounds. No wheezing, rhonchi or rales.  Skin:    General: Skin is warm and dry.  Neurological:     Mental Status: She is alert.  Psychiatric:        Speech: Speech normal.        Behavior: Behavior normal.        Thought Content: Thought content normal.        Assessment & Plan:   Problem List Items Addressed This Visit      Other   HLD (hyperlipidemia)    LDL cholesterol slightly elevated, overall patient is low cardiovascular risk.  I we did discuss at length her patient's mother's history of CVA, hyperlipidemia, and I did advise that it is very reasonable to start a low-dose cholesterol medication.  Patient politely  declines at this time as she was like to work on lifestyle changes.  Advised repeat cholesterol in 6 months           I am having 11/25/19 maintain her Calcium Carbonate-Vit D-Min (CALCIUM 1200 PO), Azelastine HCl, senna-docusate, polyethylene glycol powder, bisacodyl, tamsulosin, and Cyanocobalamin (B-12 PO).   No orders of the defined types were placed in this encounter.   Return precautions given.   Risks, benefits, and alternatives of the medications and treatment plan prescribed today were discussed, and patient expressed understanding.   Education  regarding symptom management and diagnosis given to patient on AVS.  Continue to follow with Burnard Hawthorne, FNP for routine health maintenance.   [name] and I agreed with plan.   Mable Paris, FNP

## 2019-10-13 ENCOUNTER — Other Ambulatory Visit: Payer: Self-pay

## 2019-10-13 ENCOUNTER — Ambulatory Visit (INDEPENDENT_AMBULATORY_CARE_PROVIDER_SITE_OTHER): Payer: BC Managed Care – PPO

## 2019-10-13 DIAGNOSIS — R079 Chest pain, unspecified: Secondary | ICD-10-CM

## 2019-10-14 ENCOUNTER — Telehealth: Payer: Self-pay

## 2019-10-14 LAB — EXERCISE TOLERANCE TEST
Estimated workload: 7 METS
Exercise duration (min): 5 min
Exercise duration (sec): 18 s
MPHR: 169 {beats}/min
Peak HR: 164 {beats}/min
Percent HR: 97 %
RPE: 15
Rest HR: 84 {beats}/min

## 2019-10-14 NOTE — Telephone Encounter (Signed)
-----   Message from Yvonne Kendall, MD sent at 10/14/2019 11:18 AM EDT ----- Please let [name] know that her stress test is normal.  We will f/u with her after completion of the echocardiogram next month.

## 2019-10-14 NOTE — Telephone Encounter (Signed)
Call to patient to review stress test results.    Pt verbalized understanding and has no further questions at this time.    Advised pt to call for any further questions or concerns.  No further orders.   

## 2019-10-16 ENCOUNTER — Ambulatory Visit: Payer: BC Managed Care – PPO | Admitting: Urology

## 2019-10-17 ENCOUNTER — Telehealth: Payer: Self-pay | Admitting: Family

## 2019-10-17 IMAGING — MG DIGITAL SCREENING BILATERAL MAMMOGRAM WITH TOMO AND CAD
8 series · 9 of 24 positions shown · non-contrast
Comparison: Previous exam(s).

CLINICAL DATA: Screening.

EXAM:
DIGITAL SCREENING BILATERAL MAMMOGRAM WITH TOMO AND CAD

[R CC synth-2D]
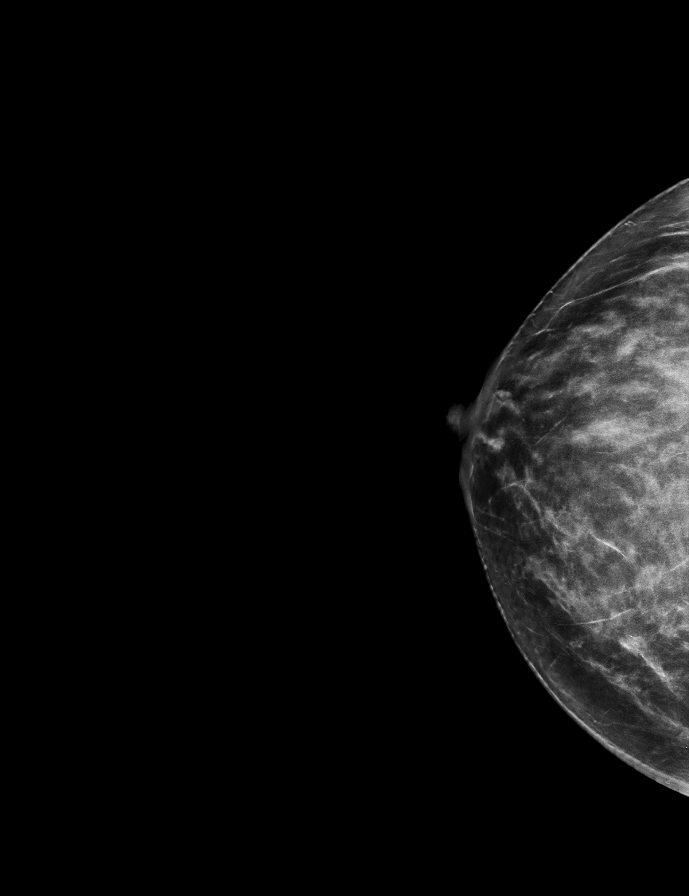

[L CC synth-2D]
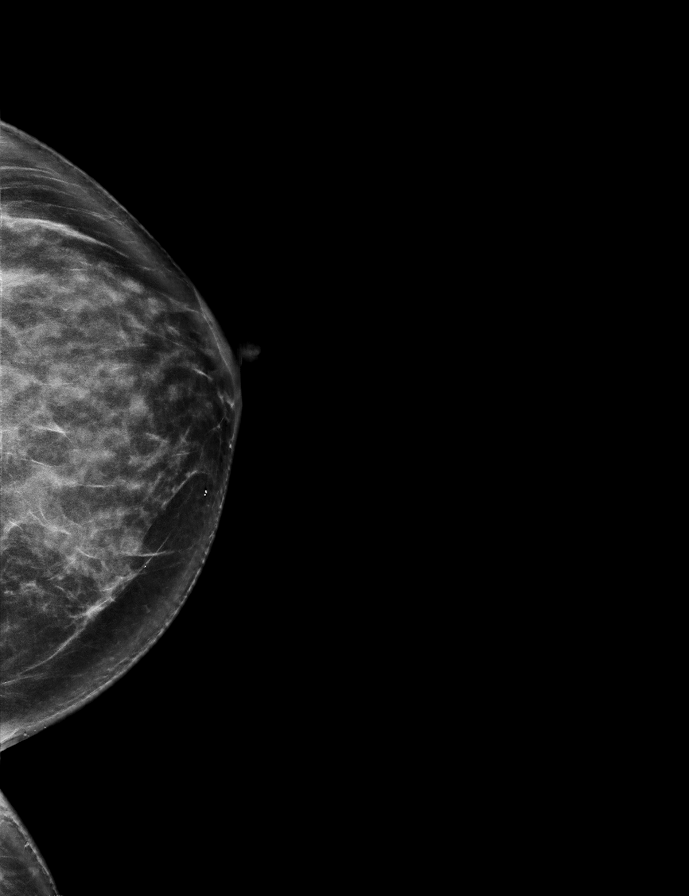

[R MLO synth-2D]
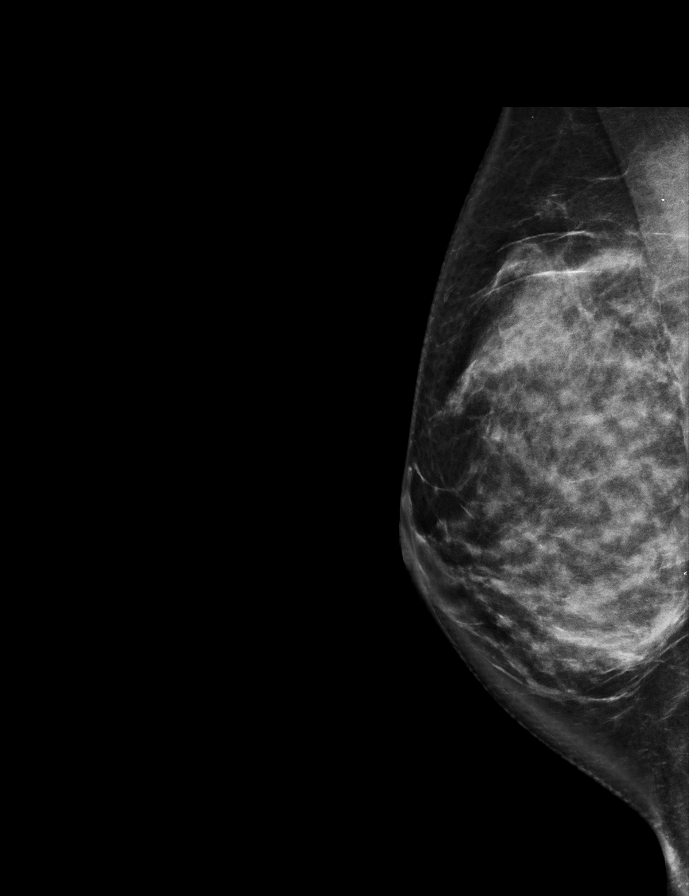

[L MLO synth-2D]
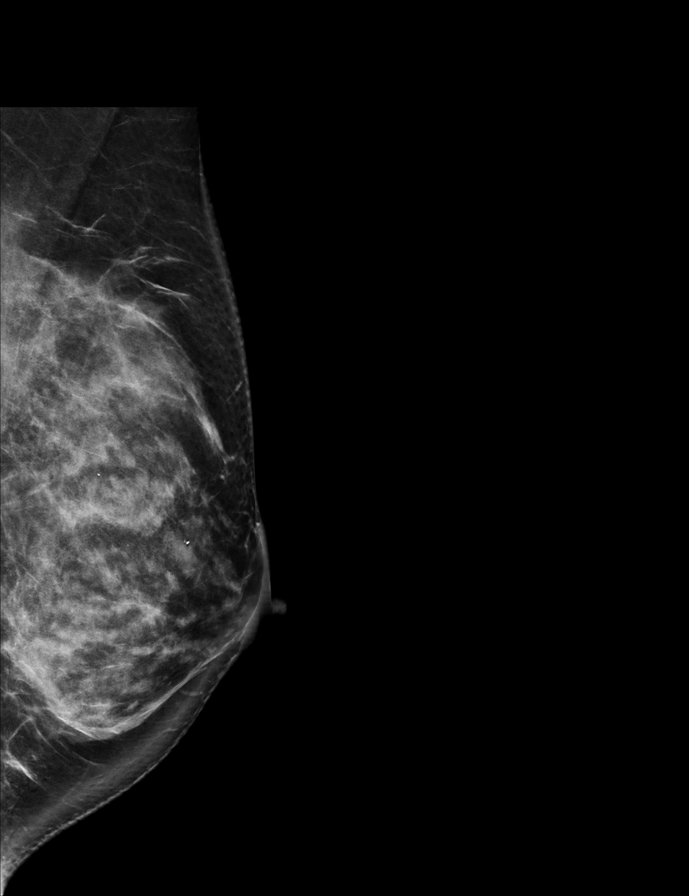

[L CC tomo · 2 of 73 frames shown]
[frame 24/73]
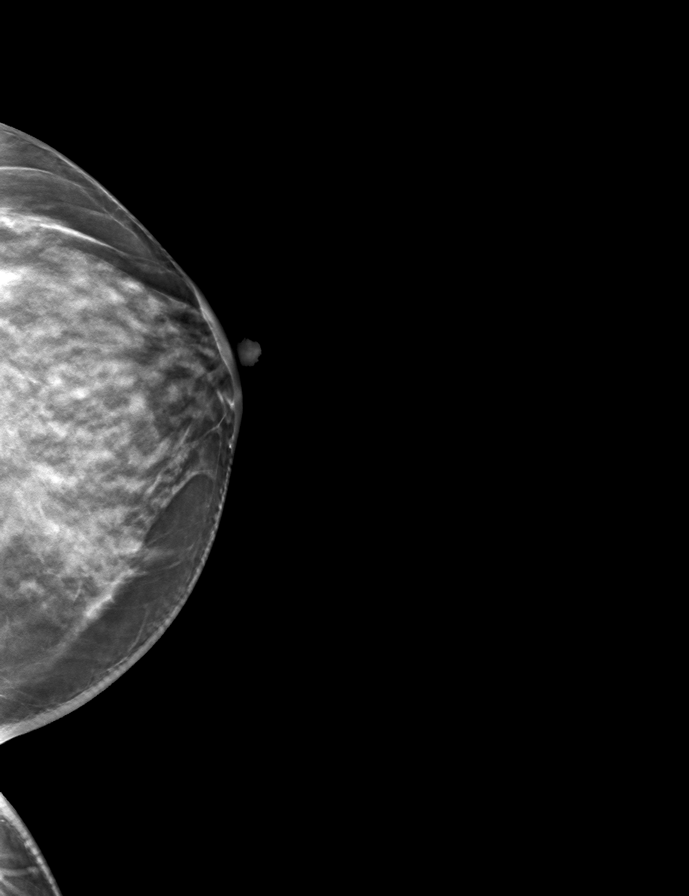
[frame 37/73]
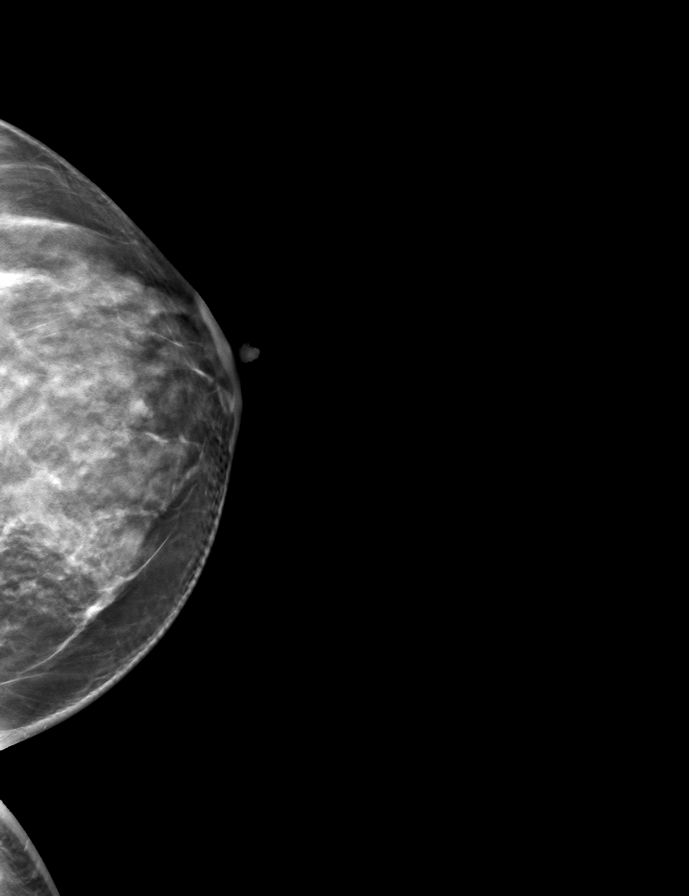

[R CC tomo · tomo slice 31/62.0]
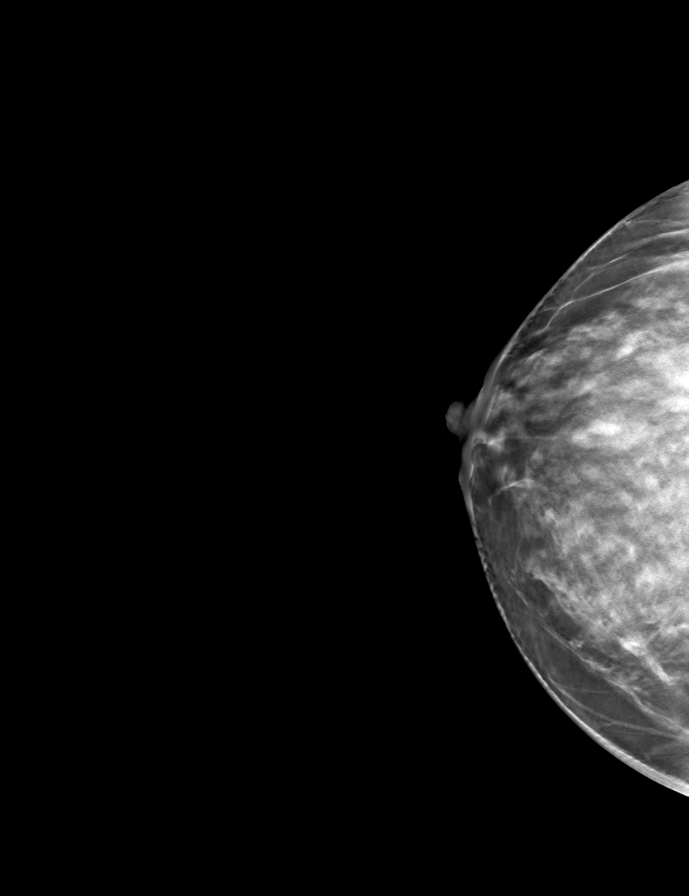

[R MLO tomo · tomo slice 39/78.0]
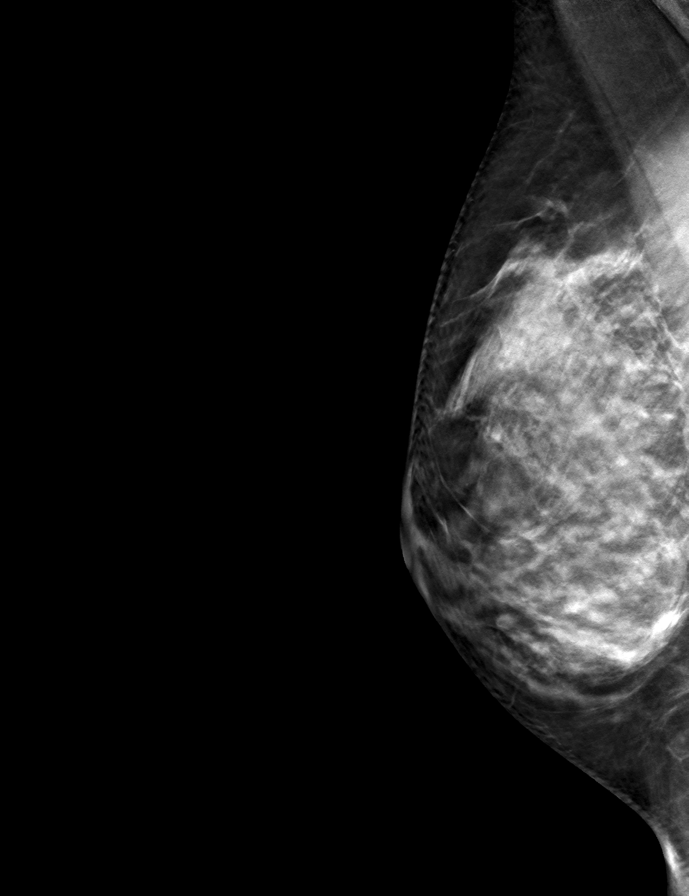

[L MLO tomo · tomo slice 40/79.0]
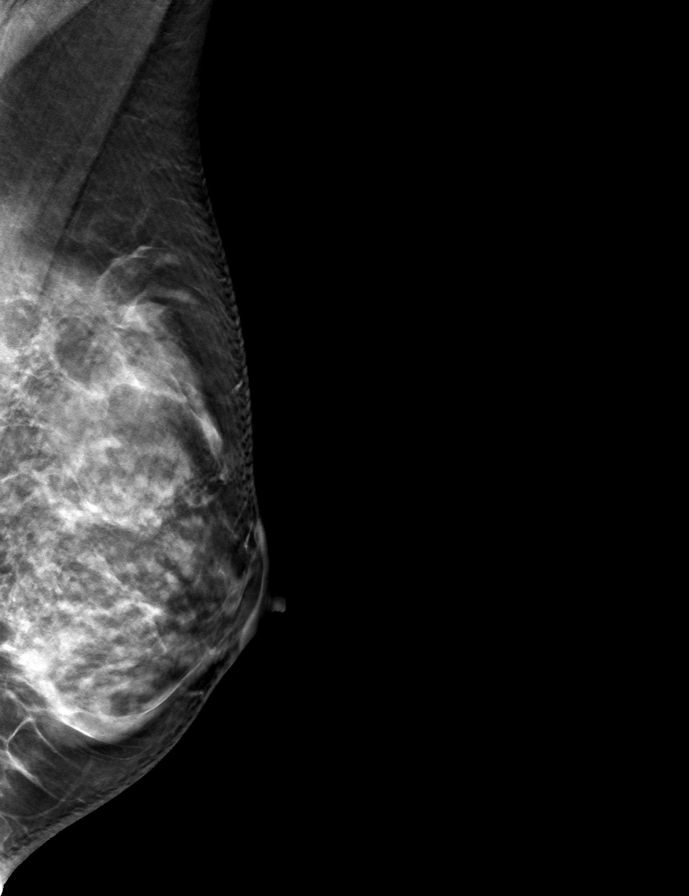

[9 of 24 positions shown; findings below may reference images not displayed]

ACR Breast Density Category c: The breast tissue is heterogeneously
dense, which may obscure small masses.
FINDINGS: There are no findings suspicious for malignancy. Images were
processed with CAD.
IMPRESSION: No mammographic evidence of malignancy. A result letter of this
screening mammogram will be mailed directly to the patient.

RECOMMENDATION:
Screening mammogram in one year. (Code:FT-U-LHB)

BI-RADS CATEGORY  1: Negative.

## 2019-10-17 NOTE — Telephone Encounter (Signed)
Pt's sister said that pt is taking Pregabalin 50 mg 2x a day.

## 2019-10-22 ENCOUNTER — Telehealth: Payer: Self-pay

## 2019-10-22 ENCOUNTER — Other Ambulatory Visit: Payer: Self-pay

## 2019-10-22 ENCOUNTER — Encounter: Payer: Self-pay | Admitting: Obstetrics and Gynecology

## 2019-10-22 ENCOUNTER — Ambulatory Visit (INDEPENDENT_AMBULATORY_CARE_PROVIDER_SITE_OTHER): Payer: BC Managed Care – PPO | Admitting: Obstetrics and Gynecology

## 2019-10-22 VITALS — BP 110/80 | Ht 70.0 in | Wt 151.0 lb

## 2019-10-22 DIAGNOSIS — R399 Unspecified symptoms and signs involving the genitourinary system: Secondary | ICD-10-CM | POA: Diagnosis not present

## 2019-10-22 DIAGNOSIS — K5904 Chronic idiopathic constipation: Secondary | ICD-10-CM | POA: Diagnosis not present

## 2019-10-22 DIAGNOSIS — N760 Acute vaginitis: Secondary | ICD-10-CM

## 2019-10-22 DIAGNOSIS — B9689 Other specified bacterial agents as the cause of diseases classified elsewhere: Secondary | ICD-10-CM

## 2019-10-22 LAB — POCT WET PREP WITH KOH
Clue Cells Wet Prep HPF POC: NEGATIVE
KOH Prep POC: NEGATIVE
Trichomonas, UA: NEGATIVE
Yeast Wet Prep HPF POC: NEGATIVE

## 2019-10-22 LAB — POCT URINALYSIS DIPSTICK
Bilirubin, UA: NEGATIVE
Blood, UA: NEGATIVE
Glucose, UA: NEGATIVE
Ketones, UA: NEGATIVE
Leukocytes, UA: NEGATIVE
Nitrite, UA: NEGATIVE
Protein, UA: NEGATIVE
Spec Grav, UA: 1.01 (ref 1.010–1.025)
pH, UA: 7.5 (ref 5.0–8.0)

## 2019-10-22 NOTE — Patient Instructions (Signed)
I value your feedback and entrusting us with your care. If you get a [facility] patient survey, I would appreciate you taking the time to let us know about your experience today. Thank you!  As of March 27, 2019, your lab results will be released to your MyChart immediately, before I even have a chance to see them. Please give me time to review them and contact you if there are any abnormalities. Thank you for your patience.  

## 2019-10-22 NOTE — Telephone Encounter (Signed)
Pt calling; knew we were closed on the 5th so she went to [facility]; had a yeast inf and a vag inf; given antibx; not helping - still has fishy odor and it hurts; would like appt to be seen c [place].  [phone]  Left detailed msg to call and schedule appt.  If given a cream to use don't use it the night before she is seen b/c that will be all the provider will see under the microscope.

## 2019-10-22 NOTE — Progress Notes (Signed)
Allegra GranaArnett, Margaret G, FNP   Chief Complaint  Patient presents with  . Vaginal Discharge    fishy odor, irritation, no itching since last Friday  . Urinary Tract Infection    blood, pelvic and back pain, no frequency or burning x 4 days    HPI:      [name] is a 49 y.o. G0P0000 whose LMP was No LMP recorded. (Menstrual status: Perimenopausal)., presents today for increased d/c with irritation and fishy odor for past 5 days. Seen at urgent care and diagnosed with BV 10/20/19 and given flagyl BID for 7 days and diflucan x 2 prn sx. Pt states wet prep not done--treated based on sx. Still having fishy odor.  Pt also with UTI sx including episodic hematuria.  UA checked and C&S sent 10/20/19 (awaiting results--not available yet per CMA at Urgent care). Hx of recurrent UTIs, seeing urology. Now doing self cath BID since urinary retention a possible etiology of recurrent sx. Pt may also need pelvic PT. Has chronic constipation which also may be contributor to urinary retention. Pt doing 4 senna daily, but hasn't had BM for past 4 days. Has pelvic discomfort but hard to know if bladder or GI etiology.    Past Medical History:  Diagnosis Date  . Asthma   . Chronic pelvic pain in female 2018  . Cystitis   . Cystocele   . Endometrial polyp   . Endometriosis    per pt report but never seen during surg  . Frequent headaches   . Gastritis   . GERD (gastroesophageal reflux disease)    rare  . Gross hematuria   . History of kidney stones   . Microscopic hematuria   . Migraine   . Migraines   . Osteopenia   . Urinary disorder   . UTI (lower urinary tract infection)     Past Surgical History:  Procedure Laterality Date  . COLONOSCOPY N/A 11/05/2014   Procedure: COLONOSCOPY;  Surgeon: Scot Junobert T Elliott, MD;  [facility];  Service: Endoscopy;  Laterality: N/A;  . COLONOSCOPY  11/2016   Dr. Mechele CollinElliott  . CYSTOSCOPY  2007   with biopsy   . CYSTOSCOPY N/A 06/01/2016    Procedure: CYSTOSCOPY;  Surgeon: Vena AustriaAndreas Staebler, MD;  Location: [facility] ORS;  Service: Gynecology;  Laterality: N/A;  . DIAGNOSTIC LAPAROSCOPY    . DILATATION & CURETTAGE/HYSTEROSCOPY WITH MYOSURE N/A 04/13/2015   Procedure: DILATATION & CURETTAGE/HYSTEROSCOPY WITH MYOSURE/POLYPECTOMY;  Surgeon: Elenora Fenderhelsea C Ward, MD;  Location: [facility] ORS;  Service: Gynecology;  Laterality: N/A;  . DILATATION & CURETTAGE/HYSTEROSCOPY WITH MYOSURE N/A 06/01/2016   Procedure: DILATATION & CURETTAGE/HYSTEROSCOPY WITH MYOSURE;  Surgeon: Vena AustriaAndreas Staebler, MD;  Location: [facility] ORS;  Service: Gynecology;  Laterality: N/A;  . GUM SURGERY    . laproscopy  2007  . POLYPECTOMY     endometrial    Family History  Problem Relation Age of Onset  . Colon cancer Father 3750  . Arthritis Father   . Hyperlipidemia Father   . Transient ischemic attack Father   . Cancer - Colon Father 5155  . Prostate cancer Father 3668  . Valvular heart disease Father   . Arthritis Mother   . Heart disease Mother   . Stroke Mother        TIA  . Hypertension Mother   . Diabetes Mother   . Kidney cancer Mother 6145  . Heart failure Mother   . Hyperlipidemia Mother   . Cancer Maternal Grandmother   .  Cancer Paternal Grandfather        prostate  . Breast cancer Cousin 35       has contact    Social History   Socioeconomic History  . Marital status: Single    Spouse name: Not on file  . Number of children: Not on file  . Years of education: Not on file  . Highest education level: Not on file  Occupational History  . Not on file  Tobacco Use  . Smoking status: Never Smoker  . Smokeless tobacco: Never Used  Vaping Use  . Vaping Use: Never used  Substance and Sexual Activity  . Alcohol use: No  . Drug use: No  . Sexual activity: Not on file  Other Topics Concern  . Not on file  Social History Narrative   Lives with twin sister   Work- Film/video editor [employer]    No pets    No children    Right handed    No caffeine  daily- tea occasionally; eats chocolate    Enjoys shopping, resting, [place].    Social Determinants of Health   Financial Resource Strain:   . Difficulty of Paying Living Expenses:   Food Insecurity:   . Worried About Programme researcher, broadcasting/film/video in the Last Year:   . Barista in the Last Year:   Transportation Needs:   . Freight forwarder (Medical):   Marland Kitchen Lack of Transportation (Non-Medical):   Physical Activity:   . Days of Exercise per Week:   . Minutes of Exercise per Session:   Stress:   . Feeling of Stress :   Social Connections:   . Frequency of Communication with Friends and Family:   . Frequency of Social Gatherings with Friends and Family:   . Attends Religious Services:   . Active Member of Clubs or Organizations:   . Attends Banker Meetings:   Marland Kitchen Marital Status:   Intimate Partner Violence:   . Fear of Current or Ex-Partner:   . Emotionally Abused:   Marland Kitchen Physically Abused:   . Sexually Abused:     Outpatient Medications Prior to Visit  Medication Sig Dispense Refill  . Azelastine HCl 137 MCG/SPRAY SOLN SMARTSIG:2 Spray(s) Both Nares Twice Daily PRN    . Calcium Carbonate-Vit D-Min (CALCIUM 1200 PO) Take 2-3 tablets by mouth daily. Reported on 05/26/2015    . fluconazole (DIFLUCAN) 150 MG tablet Take 1 tablet today. If symptoms still present in 3 days, take second tablet.    . metroNIDAZOLE (FLAGYL) 500 MG tablet Take by mouth.    . pregabalin (LYRICA) 50 MG capsule Take by mouth.    . senna-docusate (SENOKOT-S) 8.6-50 MG tablet Take 2 tablets by mouth 2 (two) times daily. 120 tablet 0  . tamsulosin (FLOMAX) 0.4 MG CAPS capsule Take 1 capsule (0.4 mg total) by mouth daily. 30 capsule 12  . bisacodyl (DULCOLAX) 10 MG suppository Place 1 suppository (10 mg total) rectally as needed for moderate constipation. (Patient not taking: Reported on 10/10/2019) 20 suppository 0  . Cyanocobalamin (B-12 PO) Take by mouth daily.    . polyethylene  glycol powder (GLYCOLAX/MIRALAX) 17 GM/SCOOP powder 1 cap full in a full glass of water, two times a day for 3 days. (Patient not taking: Reported on 10/10/2019) 255 g 0   No facility-administered medications prior to visit.      ROS:  Review of Systems  Constitutional: Negative for fever.  Gastrointestinal: Positive  for constipation. Negative for blood in stool, diarrhea, nausea and vomiting.  Genitourinary: Positive for hematuria, pelvic pain and vaginal discharge. Negative for dyspareunia, dysuria, flank pain, frequency, urgency, vaginal bleeding and vaginal pain.  Musculoskeletal: Positive for back pain.  Skin: Negative for rash.   BREAST: No symptoms   OBJECTIVE:   Vitals:  BP 110/80   Ht 5\' 10"  (1.778 m)   Wt 151 lb (68.5 kg)   BMI 21.67 kg/m   Physical Exam Vitals reviewed.  Constitutional:      Appearance: She is well-developed. She is not ill-appearing or toxic-appearing.  Pulmonary:     Effort: Pulmonary effort is normal.  Abdominal:     Tenderness: There is abdominal tenderness in the suprapubic area. There is no right CVA tenderness or left CVA tenderness.  Genitourinary:    General: Normal vulva.     Pubic Area: No rash.      Labia:        Right: No rash, tenderness or lesion.        Left: No rash, tenderness or lesion.      Vagina: Normal. No vaginal discharge, erythema or tenderness.     Cervix: Normal.     Uterus: Normal. Not enlarged and not tender.      Adnexa: Right adnexa normal and left adnexa normal.       Right: No mass or tenderness.         Left: No mass or tenderness.    Musculoskeletal:        General: Normal range of motion.     Cervical back: Normal range of motion.  Skin:    General: Skin is warm and dry.  Neurological:     General: No focal deficit present.     Mental Status: She is alert and oriented to person, place, and time.     Cranial Nerves: No cranial nerve deficit.  Psychiatric:        Mood and Affect: Mood normal.         Behavior: Behavior normal.        Thought Content: Thought content normal.        Judgment: Judgment normal.     Results: Results for orders placed or performed in visit on 10/22/19 (from the past 24 hour(s))  POCT Urinalysis Dipstick     Status: Normal   Collection Time: 10/22/19  3:46 PM  Result Value Ref Range   Color, UA yellow    Clarity, UA clear    Glucose, UA Negative Negative   Bilirubin, UA neg    Ketones, UA neg    Spec Grav, UA 1.010 1.010 - 1.025   Blood, UA neg    pH, UA 7.5 5.0 - 8.0   Protein, UA Negative Negative   Urobilinogen, UA     Nitrite, UA neg    Leukocytes, UA Negative Negative   Appearance     Odor    POCT Wet Prep with KOH     Status: Normal   Collection Time: 10/22/19  3:46 PM  Result Value Ref Range   Trichomonas, UA Negative    Clue Cells Wet Prep HPF POC neg    Epithelial Wet Prep HPF POC     Yeast Wet Prep HPF POC neg    Bacteria Wet Prep HPF POC     RBC Wet Prep HPF POC     WBC Wet Prep HPF POC     KOH Prep POC Negative Negative  Assessment/Plan: Bacterial vaginosis - Plan: POCT Wet Prep with KOH; Pos sx, neg wet prep. Already taking Flagyl. Reassurance. F/u prn. Add probiotics.   UTI symptoms - Plan: POCT Urinalysis Dipstick; neg UA. Pt to call Urgent Care tomorrow to get C&S results. Cont to f/u with urology.   Chronic idiopathic constipation--try miralax to see if improves sx and pelvic discomfort. Can then resume senna.     Return if symptoms worsen or fail to improve.  Gabe Glace B. Norbert Malkin, PA-C 10/22/2019 3:47 PM

## 2019-10-26 ENCOUNTER — Encounter: Payer: Self-pay | Admitting: Emergency Medicine

## 2019-10-26 ENCOUNTER — Emergency Department: Payer: BC Managed Care – PPO

## 2019-10-26 ENCOUNTER — Emergency Department
Admission: EM | Admit: 2019-10-26 | Discharge: 2019-10-26 | Disposition: A | Discharge status: Home / Self Care | Payer: BC Managed Care – PPO | Attending: Emergency Medicine | Admitting: Emergency Medicine

## 2019-10-26 ENCOUNTER — Other Ambulatory Visit: Payer: Self-pay

## 2019-10-26 DIAGNOSIS — J45909 Unspecified asthma, uncomplicated: Secondary | ICD-10-CM | POA: Diagnosis not present

## 2019-10-26 DIAGNOSIS — R110 Nausea: Secondary | ICD-10-CM | POA: Diagnosis present

## 2019-10-26 DIAGNOSIS — R1030 Lower abdominal pain, unspecified: Secondary | ICD-10-CM | POA: Diagnosis not present

## 2019-10-26 LAB — CBC
HCT: 41.7 % (ref 36.0–46.0)
Hemoglobin: 14 g/dL (ref 12.0–15.0)
MCH: 30.2 pg (ref 26.0–34.0)
MCHC: 33.6 g/dL (ref 30.0–36.0)
MCV: 89.9 fL (ref 80.0–100.0)
Platelets: 232 10*3/uL (ref 150–400)
RBC: 4.64 MIL/uL (ref 3.87–5.11)
RDW: 12.7 % (ref 11.5–15.5)
WBC: 6.6 10*3/uL (ref 4.0–10.5)
nRBC: 0 % (ref 0.0–0.2)

## 2019-10-26 LAB — COMPREHENSIVE METABOLIC PANEL
ALT: 26 U/L (ref 0–44)
AST: 27 U/L (ref 15–41)
Albumin: 4.4 g/dL (ref 3.5–5.0)
Alkaline Phosphatase: 63 U/L (ref 38–126)
Anion gap: 8 (ref 5–15)
BUN: 12 mg/dL (ref 6–20)
CO2: 27 mmol/L (ref 22–32)
Calcium: 9.4 mg/dL (ref 8.9–10.3)
Chloride: 105 mmol/L (ref 98–111)
Creatinine, Ser: 0.64 mg/dL (ref 0.44–1.00)
GFR calc Af Amer: >60 mL/min (ref >60)
GFR calc non Af Amer: >60 mL/min (ref >60)
Glucose, Bld: 104 mg/dL — ABNORMAL HIGH (ref 70–99)
Potassium: 4.5 mmol/L (ref 3.5–5.1)
Sodium: 140 mmol/L (ref 135–145)
Total Bilirubin: 0.7 mg/dL (ref 0.3–1.2)
Total Protein: 7.9 g/dL (ref 6.5–8.1)

## 2019-10-26 LAB — URINALYSIS, COMPLETE (UACMP) WITH MICROSCOPIC
Bacteria, UA: NONE SEEN
Bilirubin Urine: NEGATIVE
Glucose, UA: NEGATIVE mg/dL
Ketones, ur: 5 mg/dL — AB
Nitrite: NEGATIVE
Protein, ur: NEGATIVE mg/dL
Specific Gravity, Urine: 1.012 (ref 1.005–1.030)
pH: 6 (ref 5.0–8.0)

## 2019-10-26 LAB — POCT PREGNANCY, URINE: Preg Test, Ur: NEGATIVE

## 2019-10-26 LAB — LIPASE, BLOOD: Lipase: 41 U/L (ref 11–51)

## 2019-10-26 MED ORDER — SODIUM CHLORIDE 0.9% FLUSH: 3.0000 mL | Freq: Once | INTRAVENOUS | Status: DC

## 2019-10-26 MED ORDER — IOPAMIDOL (ISOVUE-300) INJECTION 61%: 100.0000 mL | Freq: Once | INTRAVENOUS | Status: DC | PRN

## 2019-10-26 MED ORDER — IOHEXOL 300 MG/ML  SOLN
100.0000 mL | Freq: Once | INTRAMUSCULAR | Status: AC | PRN
  Administered 2019-10-26: 100 mL via INTRAVENOUS

## 2019-10-26 NOTE — ED Notes (Signed)
Pt presents to the ED for constipation, nausea, and abdominal pain. Pt states she hasn't had a BM since Wednesday morning. Pt has tried OTC medications with no relief, pt also states that she has been having nausea since this started. Pt also having lower abdominal pain. Pt called GI doctor at [facility] and they told her to try suppositories. Pt has not no relief

## 2019-10-26 NOTE — ED Provider Notes (Signed)
[facility] Emergency Department Provider Note  ____________________________________________   First MD Initiated Contact with Patient 10/26/19 1001     (approximate)  I have reviewed the triage vital signs and the nursing notes.   HISTORY  Chief Complaint Abdominal Pain and Nausea    HPI Cristina Wade is a 49 y.o. female with kidney stones, migraines who comes in with abdominal pain.  Patient reports having abdominal pain for the past few days.  Also concern for some nausea.  Patient states that she was told that she had a lot of stool in her colon on Wednesday with her GI doctor.  She is told that she is irritable bowel syndrome.  She has been taking a lot of over-the-counter suppositories, enemas.  On senna chronically.  She reports having some hard ball stools but now having some diarrhea.  She continues to have abdominal pain in the lower abdomen, intermittent, nothing makes it better, worse after eating, moderate in nature.  She denies any fevers, chest pain, shortness of breath          Past Medical History:  Diagnosis Date   Asthma    Chronic pelvic pain in female 2018   Cystitis    Cystocele    Endometrial polyp    Endometriosis    per pt report but never seen during surg   Frequent headaches    Gastritis    GERD (gastroesophageal reflux disease)    rare   Gross hematuria    History of kidney stones    Microscopic hematuria    Migraine    Migraines    Osteopenia    Urinary disorder    UTI (lower urinary tract infection)     Patient Active Problem List   Diagnosis Date Noted   HLD (hyperlipidemia) 10/10/2019   Dyspnea on exertion 09/20/2019   Orthopnea 09/20/2019   Urinary retention 08/12/2018   Anxiety 08/12/2018   Elevated blood pressure reading without diagnosis of hypertension 07/12/2018   Left leg pain 07/12/2018   Acute left-sided low back pain 06/03/2018   Cloudy urine 04/29/2018   Chest pain  04/29/2018   Frontal headache 04/27/2018   Localized, primary osteoarthritis 02/07/2018   Pelvic pain 01/30/2018   Slow transit constipation 10/10/2017   Bilateral impacted cerumen 11/28/2016   Chronic bilateral low back pain without sciatica 11/28/2016   Precordial pain 07/07/2016   Right bundle branch block (RBBB) on electrocardiogram (ECG) 07/07/2016   Chest tightness 06/27/2016   Preventative health care 09/30/2015   Encounter to establish care 09/28/2014   Vestibular migraine 09/28/2014   Encounter for screening colonoscopy 11/21/2013    Past Surgical History:  Procedure Laterality Date   COLONOSCOPY N/A 11/05/2014   Procedure: COLONOSCOPY;  Surgeon: Scot Jun, MD;  Location: [facility] ENDOSCOPY;  Service: Endoscopy;  Laterality: N/A;   COLONOSCOPY  11/2016   Dr. Mechele Collin   CYSTOSCOPY  2007   with biopsy    CYSTOSCOPY N/A 06/01/2016   Procedure: CYSTOSCOPY;  Surgeon: Vena Austria, MD;  Location: [facility] ORS;  Service: Gynecology;  Laterality: N/A;   DIAGNOSTIC LAPAROSCOPY     DILATATION & CURETTAGE/HYSTEROSCOPY WITH MYOSURE N/A 04/13/2015   Procedure: DILATATION & CURETTAGE/HYSTEROSCOPY WITH MYOSURE/POLYPECTOMY;  Surgeon: Elenora Fender Ward, MD;  Location: [facility] ORS;  Service: Gynecology;  Laterality: N/A;   DILATATION & CURETTAGE/HYSTEROSCOPY WITH MYOSURE N/A 06/01/2016   Procedure: DILATATION & CURETTAGE/HYSTEROSCOPY WITH MYOSURE;  Surgeon: Vena Austria, MD;  Location: [facility] ORS;  Service: Gynecology;  Laterality: N/A;  GUM SURGERY     laproscopy  2007   POLYPECTOMY     endometrial    Prior to Admission medications   Medication Sig Start Date End Date Taking? Authorizing Provider  Azelastine HCl 137 MCG/SPRAY SOLN SMARTSIG:2 Spray(s) Both Nares Twice Daily PRN 07/21/19   [provider]  Calcium Carbonate-Vit D-Min (CALCIUM 1200 PO) Take 2-3 tablets by mouth daily. Reported on 05/26/2015    [provider]  fluconazole (DIFLUCAN) 150  MG tablet Take 1 tablet today. If symptoms still present in 3 days, take second tablet. 10/20/19   [provider]  metroNIDAZOLE (FLAGYL) 500 MG tablet Take by mouth. 10/20/19 10/27/19  [provider]  pregabalin (LYRICA) 50 MG capsule Take by mouth. 10/14/19   [provider]  senna-docusate (SENOKOT-S) 8.6-50 MG tablet Take 2 tablets by mouth 2 (two) times daily. 08/29/19   Sharman CheekStafford, Phillip, MD  tamsulosin (FLOMAX) 0.4 MG CAPS capsule Take 1 capsule (0.4 mg total) by mouth daily. 09/25/19   Michiel CowboyMcGowan, Shannon A, PA-C    Allergies Lac bovis, Milk-related compounds, Omeprazole, and Penicillins  Family History  Problem Relation Age of Onset   Colon cancer Father 4950   Arthritis Father    Hyperlipidemia Father    Transient ischemic attack Father    Cancer - Colon Father 4255   Prostate cancer Father 1068   Valvular heart disease Father    Arthritis Mother    Heart disease Mother    Stroke Mother        TIA   Hypertension Mother    Diabetes Mother    Kidney cancer Mother 2745   Heart failure Mother    Hyperlipidemia Mother    Cancer Maternal Grandmother    Cancer Paternal Grandfather        prostate   Breast cancer Cousin 3335       has contact    Social History Social History   Tobacco Use   Smoking status: Never Smoker   Smokeless tobacco: Never Used  Building services engineerVaping Use   Vaping Use: Never used  Substance Use Topics   Alcohol use: No   Drug use: No      Review of Systems Constitutional: No fever/chills Eyes: No visual changes. ENT: No sore throat. Cardiovascular: Denies chest pain. Respiratory: Denies shortness of breath. Gastrointestinal abdominal pain, nausea, constipation, diarrhea Genitourinary: Negative for dysuria. Musculoskeletal: Negative for back pain. Skin: Negative for rash. Neurological: Negative for headaches, focal weakness or numbness. All other ROS negative ____________________________________________   PHYSICAL  EXAM:  VITAL SIGNS: ED Triage Vitals  Enc Vitals Group     BP 10/26/19 0840 106/76     Pulse Rate 10/26/19 0840 72     Resp 10/26/19 0840 18     Temp 10/26/19 0840 98.9 F (37.2 C)     Temp Source 10/26/19 0840 Oral     SpO2 10/26/19 0840 100 %     Weight 10/26/19 0837 151 lb (68.5 kg)     Height 10/26/19 0837 5\' 10"  (1.778 m)     Head Circumference --      Peak Flow --      Pain Score 10/26/19 0836 10     Pain Loc --      Pain Edu? --      Excl. in GC? --     Constitutional: Alert and oriented. Well appearing and in no acute distress. Eyes: Conjunctivae are normal. EOMI. Head: Atraumatic. Nose: No congestion/rhinnorhea. Mouth/Throat: Mucous membranes are moist.  Neck: No stridor. Trachea Midline. FROM Cardiovascular: Normal rate, regular rhythm. Grossly normal heart sounds.  Good peripheral circulation. Respiratory: Normal respiratory effort.  No retractions. Lungs CTAB. Gastrointestinal: Soft and nontender but some reported discomfort in the lower abdomen.  No rebound, no guarding no distention. No abdominal bruits.  Musculoskeletal: No lower extremity tenderness nor edema.  No joint effusions. Neurologic:  Normal speech and language. No gross focal neurologic deficits are appreciated.  Skin:  Skin is warm, dry and intact. No rash noted. Psychiatric: Mood and affect are normal. Speech and behavior are normal. GU: Normal tone, no stool in the vault  ____________________________________________   LABS (all labs ordered are listed, but only abnormal results are displayed)  Labs Reviewed  COMPREHENSIVE METABOLIC PANEL - Abnormal; Notable for the following components:      Result Value   Glucose, Bld 104 (*)    All other components within normal limits  URINALYSIS, COMPLETE (UACMP) WITH MICROSCOPIC - Abnormal; Notable for the following components:   Color, Urine YELLOW (*)    APPearance HAZY (*)    Hgb urine dipstick SMALL (*)    Ketones, ur 5 (*)    Leukocytes,Ua  TRACE (*)    All other components within normal limits  LIPASE, BLOOD  CBC  POC URINE PREG, ED  POCT PREGNANCY, URINE   ____________________________________________  RADIOLOGY   Official radiology report(s): CT ABDOMEN PELVIS W CONTRAST  Result Date: 10/26/2019 CLINICAL DATA:  Abdominal distension. Constipation, nausea, and abdominal pain. EXAM: CT ABDOMEN AND PELVIS WITH CONTRAST TECHNIQUE: Multidetector CT imaging of the abdomen and pelvis was performed using the standard protocol following bolus administration of intravenous contrast. CONTRAST:  OMNIPAQUE IOHEXOL 300 MG/ML  SOLN COMPARISON:  April 08, 2019 FINDINGS: Lower chest: No acute abnormality. Hepatobiliary: There is a hemangioma in the hepatic dome on series 2, image 9. A small cyst or hemangioma is seen in the right hepatic lobe on series 2, image 22. Mild opacity steatosis. The liver, portal vein, and gallbladder are otherwise normal. Pancreas: Unremarkable. No pancreatic ductal dilatation or surrounding inflammatory changes. Spleen: Normal in size without focal abnormality. Adrenals/Urinary Tract: Adrenal glands are unremarkable. Kidneys are normal, without renal calculi, focal lesion, or hydronephrosis. Bladder is unremarkable. Stomach/Bowel: Stomach is within normal limits. Appendix appears normal. No evidence of bowel wall thickening, distention, or inflammatory changes. Vascular/Lymphatic: No significant vascular findings are present. No enlarged abdominal or pelvic lymph nodes. Reproductive: Uterus and bilateral adnexa are unremarkable. Other: No abdominal wall hernia or abnormality. No abdominopelvic ascites. Musculoskeletal: No acute or significant osseous findings. IMPRESSION: 1. No cause for the patient's symptoms identified. No acute abnormalities. Electronically Signed   By: Gerome Sam III M.D   On: 10/26/2019 11:10    ____________________________________________   PROCEDURES  Procedure(s) performed  (including Critical Care):  Procedures   ____________________________________________   INITIAL IMPRESSION / ASSESSMENT AND PLAN / ED COURSE  Kyliah Deanda was evaluated in Emergency Department on 10/26/2019 for the symptoms described in the history of present illness. She was evaluated in the context of the global COVID-19 pandemic, which necessitated consideration that the patient might be at risk for infection with the SARS-CoV-2 virus that causes COVID-19. Institutional protocols and algorithms that pertain to the evaluation of patients at risk for COVID-19 are in a state of rapid change based on information released by regulatory bodies including the CDC and federal and state organizations. These policies and algorithms were followed during the patient's care in the ED.  Patient is coming in for abdominal pain, nausea and constipation but then reported having diarrhea after taking a much of suppositories.  Patient looks well-appearing with normal vital signs.  Labs are ordered evaluate for Electra abnormalities, AKI, pregnancy, UTI.  CT scan admission evidence of perforation, obstruction, SBO or other acute pathology.  Rectal exam was performed there was no stool in the vault  Pregnancy test negative UA without evidence of UTI Labs are reassuring  CT scan was negative.  Patient was very worried that the x-ray on Wednesday showed stool in the colon and then CT is negative today.  I did call the radiologist and discussed with him that there was normal stool burden.  No extensive amount of stool burden.  Again explained this to patient.  Explained to patient that she needs to follow-up with her GI doctor and to stop taking the suppositories because at this time it looks like she has normal stool burden and that her discomfort could be from irritable bowel syndrome.  Patient expressed understanding and will follow up with GI patient is tolerating p.o.   I discussed the provisional nature of ED  diagnosis, the treatment so far, the ongoing plan of care, follow up appointments and return precautions with the patient and any family or support people present. They expressed understanding and agreed with the plan, discharged home.   ____________________________________________   FINAL CLINICAL IMPRESSION(S) / ED DIAGNOSES   Final diagnoses:  Lower abdominal pain      MEDICATIONS GIVEN DURING THIS VISIT:  Medications  sodium chloride flush (NS) 0.9 % injection 3 mL (3 mLs Intravenous Not Given 10/26/19 1100)  iopamidol (ISOVUE-300) 61 % injection 100 mL (has no administration in time range)  iohexol (OMNIPAQUE) 300 MG/ML solution 100 mL (100 mLs Intravenous Contrast Given 10/26/19 1043)     ED Discharge Orders    None       Note:  This document was prepared using Dragon voice recognition software and may include unintentional dictation errors.   Concha Se, MD 10/26/19 1330

## 2019-10-26 NOTE — ED Notes (Signed)
Pt provided sandwich tray as requested. Ok per Dr Fuller Plan

## 2019-10-26 NOTE — ED Triage Notes (Signed)
Pt presents to ED via POV with c/o abdominal pain, pt states has not had a BM since Wednesday, states was told by [facility] GI to try suppositories. Pt also c/o nausea at this time. Pt A&O x4, ambulatory without difficulty at this time to triage desk.

## 2019-10-26 NOTE — ED Notes (Signed)
Pt provided warm blanket.

## 2019-10-26 NOTE — Discharge Instructions (Addendum)
Your labs are reassuring.  Your CT scan is as below.  I did clarify with the radiologist who did not see a count of stool burden.  It looked like normal stool burden.  Stop taking the enemas and suppositories and discuss further with your GI doctor  IMPRESSION: 1. No cause for the patient's symptoms identified. No acute Abnormalities.  FINDINGS: Lower chest: No acute abnormality.   Hepatobiliary: There is a hemangioma in the hepatic dome on series 2, image 9. A small cyst or hemangioma is seen in the right hepatic lobe on series 2, image 22. Mild opacity steatosis. The liver, portal vein, and gallbladder are otherwise normal.   Pancreas: Unremarkable. No pancreatic ductal dilatation or surrounding inflammatory changes.   Spleen: Normal in size without focal abnormality.   Adrenals/Urinary Tract: Adrenal glands are unremarkable. Kidneys are normal, without renal calculi, focal lesion, or hydronephrosis. Bladder is unremarkable.   Stomach/Bowel: Stomach is within normal limits. Appendix appears normal. No evidence of bowel wall thickening, distention, or inflammatory changes.   Vascular/Lymphatic: No significant vascular findings are present. No enlarged abdominal or pelvic lymph nodes.   Reproductive: Uterus and bilateral adnexa are unremarkable.   Other: No abdominal wall hernia or abnormality. No abdominopelvic ascites.   Musculoskeletal: No acute or significant osseous findings.

## 2019-10-29 ENCOUNTER — Ambulatory Visit: Payer: BC Managed Care – PPO | Admitting: Internal Medicine

## 2019-10-30 ENCOUNTER — Telehealth: Payer: Self-pay

## 2019-10-30 ENCOUNTER — Other Ambulatory Visit: Payer: Self-pay | Admitting: Student

## 2019-10-30 DIAGNOSIS — K5909 Other constipation: Secondary | ICD-10-CM

## 2019-10-30 DIAGNOSIS — R1033 Periumbilical pain: Secondary | ICD-10-CM

## 2019-10-30 DIAGNOSIS — R1031 Right lower quadrant pain: Secondary | ICD-10-CM

## 2019-10-30 NOTE — Telephone Encounter (Signed)
LMVM TRC. 

## 2019-10-30 NOTE — Telephone Encounter (Signed)
Per [facility] pt notified to complete Diflucan given by [facility]. Patient states she took one, but forgot the other one. She will take it now. Also advised per [facility] to use unscented soap, no dryer sheets and can use OTC hydrocortisone cream externally TID at the opening.

## 2019-10-30 NOTE — Telephone Encounter (Signed)
Spoke w/patient. She states she is having white vaginal d/c without odor. Some itching at vaginal opening.

## 2019-10-30 NOTE — Telephone Encounter (Signed)
Patient states she had a d/c with odor and pain in her ovaries. She was seen at [facility] and was rx'd abx. It took care of it, but the d/c is coming back and her ovaries are hurting. She's inquiring what to do. [phone]

## 2019-10-31 ENCOUNTER — Other Ambulatory Visit: Payer: Self-pay

## 2019-10-31 ENCOUNTER — Ambulatory Visit (INDEPENDENT_AMBULATORY_CARE_PROVIDER_SITE_OTHER): Payer: BC Managed Care – PPO

## 2019-10-31 DIAGNOSIS — R079 Chest pain, unspecified: Secondary | ICD-10-CM

## 2019-10-31 LAB — ECHOCARDIOGRAM COMPLETE
AR max vel: 2.61 cm2
AV Area VTI: 2.7 cm2
AV Area mean vel: 2.5 cm2
AV Mean grad: 3 mmHg
AV Peak grad: 5.1 mmHg
Ao pk vel: 1.13 m/s
Area-P 1/2: 5.23 cm2
Calc EF: 53.2 %
S' Lateral: 3 cm
Single Plane A2C EF: 52.5 %
Single Plane A4C EF: 53.6 %

## 2019-11-03 ENCOUNTER — Telehealth: Payer: Self-pay

## 2019-11-03 NOTE — Telephone Encounter (Signed)
Pt aware.

## 2019-11-03 NOTE — Telephone Encounter (Signed)
Please advise 

## 2019-11-03 NOTE — Telephone Encounter (Signed)
-----   Message from Cristina Kendall, MD sent at 11/03/2019  7:06 AM EDT ----- Please let Cristina Wade know that her echo shows that her heart is contracting normally.  There are no significant valvular abnormalities.  There is evidence of some fluid retention, which may explain some of her swelling and shortness of breath.  If she continues to have symptoms, I suggest that we add furosemide 20 mg daily

## 2019-11-03 NOTE — Telephone Encounter (Signed)
It is not GYN related. Has had same sx off and on for years and it's related to constipation. Needs to treat constipation sx. Had neg CT scan last wk for uterus/ovaries.

## 2019-11-03 NOTE — Telephone Encounter (Signed)
Patient made aware of echo results and Dr. Serita Kyle recommendation.  Patient denies any swelling. Patient sts that she does have some mild dyspnea and feels that it is related to deconditioning. Patient would like to hold off on starting a diuretic at this time. She plans on increasing her activity levels to hopeful help with the sob. She will call the office in a couple of weeks to update Dr. Okey Dupre. If the sob has not improved she will revisit starting Lasix at that time.  FYI update fwd to Dr. Okey Dupre. She also wanted to make sure Dr. Okey Dupre was aware that she has a hx of an incomplete LBBB.

## 2019-11-03 NOTE — Telephone Encounter (Signed)
-----   Message from Christopher End, MD sent at 11/03/2019  7:06 AM EDT ----- Please let Ms. Dykema know that her echo shows that her heart is contracting normally.  There are no significant valvular abnormalities.  There is evidence of some fluid retention, which may explain some of her swelling and shortness of breath.  If she continues to have symptoms, I suggest that we add furosemide 20 mg daily 

## 2019-11-03 NOTE — Telephone Encounter (Signed)
Pt needs to know what to do about the pain in her ovaries. The odor and discharge are gone. Please advise

## 2019-11-04 ENCOUNTER — Ambulatory Visit
Admission: RE | Admit: 2019-11-04 | Discharge: 2019-11-04 | Disposition: A | Discharge status: Home / Self Care | Payer: BC Managed Care – PPO | Source: Ambulatory Visit | Attending: Student | Admitting: Student

## 2019-11-04 ENCOUNTER — Other Ambulatory Visit: Payer: Self-pay

## 2019-11-04 DIAGNOSIS — R1032 Left lower quadrant pain: Secondary | ICD-10-CM | POA: Insufficient documentation

## 2019-11-04 DIAGNOSIS — K5909 Other constipation: Secondary | ICD-10-CM | POA: Insufficient documentation

## 2019-11-04 DIAGNOSIS — R1033 Periumbilical pain: Secondary | ICD-10-CM | POA: Insufficient documentation

## 2019-11-04 DIAGNOSIS — R1031 Right lower quadrant pain: Secondary | ICD-10-CM | POA: Insufficient documentation

## 2019-11-06 ENCOUNTER — Other Ambulatory Visit: Payer: Self-pay | Admitting: Student

## 2019-11-06 DIAGNOSIS — G8929 Other chronic pain: Secondary | ICD-10-CM

## 2019-11-06 MED ORDER — FUROSEMIDE 20 MG PO TABS
20.0000 mg | ORAL_TABLET | Freq: Every day | ORAL | 3 refills | Status: DC
Start: 2019-11-06 — End: 2019-11-26

## 2019-11-06 NOTE — Telephone Encounter (Signed)
Patient calling  States that the SOB is not any better and would like to start Lasix medication - would like prescription sent to [facility] on [address] Please call to discuss

## 2019-11-06 NOTE — Telephone Encounter (Signed)
Left message for patient to call. Will send Lasix 20 mg by mouth daily to patient's pharmacy of choice so she can start taking it.

## 2019-11-06 NOTE — Addendum Note (Signed)
Addended by: Virl Axe, Jamilya Sarrazin L on: 11/06/2019 05:14 PM   Modules accepted: Orders

## 2019-11-07 NOTE — Telephone Encounter (Signed)
I called and spoke with the patient. I confirmed with her that lasix 20 mg once daily RX was sent to the pharmacy yesterday afternoon. She voices understanding and is agreeable.

## 2019-11-11 ENCOUNTER — Telehealth: Payer: Self-pay | Admitting: Internal Medicine

## 2019-11-11 NOTE — Telephone Encounter (Signed)
Patient calling Pharmacy had the dates mixed up - patient now has medication  No need for resend

## 2019-11-11 NOTE — Telephone Encounter (Signed)
*  STAT* If patient is at the pharmacy, call can be transferred to refill team.   1. Which medications need to be refilled? (please list name of each medication and dose if known)   Lasix 20 mg po q d   2. Which pharmacy/location (including street and city if local pharmacy) is medication to be sent to?  [facility] [address]   3. Do they need a 30 day or 90 day supply? 90      Patient says the original rx didn't go through

## 2019-11-19 ENCOUNTER — Other Ambulatory Visit: Payer: Self-pay

## 2019-11-19 ENCOUNTER — Ambulatory Visit
Admission: RE | Admit: 2019-11-19 | Discharge: 2019-11-19 | Disposition: A | Discharge status: Home / Self Care | Payer: BC Managed Care – PPO | Source: Ambulatory Visit | Attending: Student | Admitting: Student

## 2019-11-19 DIAGNOSIS — G8929 Other chronic pain: Secondary | ICD-10-CM | POA: Insufficient documentation

## 2019-11-19 DIAGNOSIS — R109 Unspecified abdominal pain: Secondary | ICD-10-CM | POA: Insufficient documentation

## 2019-11-19 MED ORDER — TECHNETIUM TC 99M SULFUR COLLOID
2.3980 | Freq: Once | INTRAVENOUS | Status: AC | PRN
  Administered 2019-11-19: 2.398 via INTRAVENOUS

## 2019-11-20 ENCOUNTER — Telehealth: Payer: Self-pay

## 2019-11-20 NOTE — Telephone Encounter (Signed)
She just had a CT scan in July which showed she did not have any stones.

## 2019-11-20 NOTE — Telephone Encounter (Signed)
Patient voiced understanding. She had no further questions at this time.

## 2019-11-20 NOTE — Telephone Encounter (Signed)
Patient called stating she is having "kidney" pain for a few days. The right "kidney"pain 3/10, she states she is having chills with no fever, nausea or vomiting. She has a history of stones and wants to know if she needs to have anything done? She is doing well with self cath 1-2 times daily

## 2019-11-26 ENCOUNTER — Ambulatory Visit (INDEPENDENT_AMBULATORY_CARE_PROVIDER_SITE_OTHER): Payer: BC Managed Care – PPO

## 2019-11-26 ENCOUNTER — Ambulatory Visit (INDEPENDENT_AMBULATORY_CARE_PROVIDER_SITE_OTHER): Payer: BC Managed Care – PPO | Admitting: Internal Medicine

## 2019-11-26 ENCOUNTER — Other Ambulatory Visit: Payer: Self-pay

## 2019-11-26 ENCOUNTER — Encounter: Payer: Self-pay | Admitting: Internal Medicine

## 2019-11-26 VITALS — BP 124/80 | HR 72 | Temp 97.9°F | Ht 70.0 in | Wt 146.6 lb

## 2019-11-26 DIAGNOSIS — M545 Low back pain, unspecified: Secondary | ICD-10-CM

## 2019-11-26 DIAGNOSIS — M549 Dorsalgia, unspecified: Secondary | ICD-10-CM

## 2019-11-26 DIAGNOSIS — G8929 Other chronic pain: Secondary | ICD-10-CM

## 2019-11-26 DIAGNOSIS — N3000 Acute cystitis without hematuria: Secondary | ICD-10-CM | POA: Diagnosis not present

## 2019-11-26 MED ORDER — CIPROFLOXACIN HCL 500 MG PO TABS: 500.0000 mg | ORAL_TABLET | Freq: Two times a day (BID) | ORAL | 0 refills | Status: DC

## 2019-11-26 NOTE — Progress Notes (Signed)
Patient presenting with pain with urination, discharge, and ovarian pain. States she will have some imaging soon but wanted to be sure she did not have a UTI.

## 2019-11-26 NOTE — Progress Notes (Signed)
Chief Complaint  Patient presents with  . Urinary Tract Infection   F/u  1. C/w UTI lower pelvic pain discharge CT 10/26/19 neg ovarian mass, kidney stones normal bladder pt self caths 2x per day UTI +11/15/19 Proteus UTI given Cefdinir 300 mg bid x 7 days  2. C/o mid back pain had MRI recently with ortho and f/u soon to review MRI lumbar   Review of Systems  Genitourinary: Positive for dysuria and flank pain.       +urinary retention   Musculoskeletal: Positive for back pain.   Past Medical History:  Diagnosis Date  . Asthma   . Chronic pelvic pain in female 2018  . Cystitis   . Cystocele   . Endometrial polyp   . Endometriosis    per pt report but never seen during surg  . Frequent headaches   . Gastritis   . GERD (gastroesophageal reflux disease)    rare  . Gross hematuria   . History of kidney stones   . Microscopic hematuria   . Migraine   . Migraines   . Osteopenia   . Urinary disorder   . UTI (lower urinary tract infection)    Past Surgical History:  Procedure Laterality Date  . COLONOSCOPY N/A 11/05/2014   Procedure: COLONOSCOPY;  Surgeon: Scot Jun, MD;  Location: [facility] ENDOSCOPY;  Service: Endoscopy;  Laterality: N/A;  . COLONOSCOPY  11/2016   Dr. Mechele Collin  . CYSTOSCOPY  2007   with biopsy   . CYSTOSCOPY N/A 06/01/2016   Procedure: CYSTOSCOPY;  Surgeon: Vena Austria, MD;  Location: [facility] ORS;  Service: Gynecology;  Laterality: N/A;  . DIAGNOSTIC LAPAROSCOPY    . DILATATION & CURETTAGE/HYSTEROSCOPY WITH MYOSURE N/A 04/13/2015   Procedure: DILATATION & CURETTAGE/HYSTEROSCOPY WITH MYOSURE/POLYPECTOMY;  Surgeon: Elenora Fender Ward, MD;  Location: [facility] ORS;  Service: Gynecology;  Laterality: N/A;  . DILATATION & CURETTAGE/HYSTEROSCOPY WITH MYOSURE N/A 06/01/2016   Procedure: DILATATION & CURETTAGE/HYSTEROSCOPY WITH MYOSURE;  Surgeon: Vena Austria, MD;  Location: [facility] ORS;  Service: Gynecology;  Laterality: N/A;  . GUM SURGERY    . laproscopy  2007  .  POLYPECTOMY     endometrial   Family History  Problem Relation Age of Onset  . Colon cancer Father 47  . Arthritis Father   . Hyperlipidemia Father   . Transient ischemic attack Father   . Cancer - Colon Father 18  . Prostate cancer Father 47  . Valvular heart disease Father   . Arthritis Mother   . Heart disease Mother   . Stroke Mother        TIA  . Hypertension Mother   . Diabetes Mother   . Kidney cancer Mother 63  . Heart failure Mother   . Hyperlipidemia Mother   . Cancer Maternal Grandmother   . Cancer Paternal Grandfather        prostate  . Breast cancer Cousin 35       has contact   Social History   Socioeconomic History  . Marital status: Single    Spouse name: Not on file  . Number of children: Not on file  . Years of education: Not on file  . Highest education level: Not on file  Occupational History  . Not on file  Tobacco Use  . Smoking status: Never Smoker  . Smokeless tobacco: Never Used  Vaping Use  . Vaping Use: Never used  Substance and Sexual Activity  . Alcohol use: No  . Drug use: No  .  Sexual activity: Not on file  Other Topics Concern  . Not on file  Social History Narrative   Lives with twin sister   Work- Film/video editor [employer]    No pets    No children    Right handed    No caffeine daily- tea occasionally; eats chocolate    Enjoys shopping, resting, [place] time [place].    Social Determinants of Health   Financial Resource Strain:   . Difficulty of Paying Living Expenses:   Food Insecurity:   . Worried About Programme researcher, broadcasting/film/video in the Last Year:   . Barista in the Last Year:   Transportation Needs:   . Freight forwarder (Medical):   Marland Kitchen Lack of Transportation (Non-Medical):   Physical Activity:   . Days of Exercise per Week:   . Minutes of Exercise per Session:   Stress:   . Feeling of Stress :   Social Connections:   . Frequency of Communication with Friends and Family:   . Frequency of  Social Gatherings with Friends and Family:   . Attends Religious Services:   . Active Member of Clubs or Organizations:   . Attends Banker Meetings:   Marland Kitchen Marital Status:   Intimate Partner Violence:   . Fear of Current or Ex-Partner:   . Emotionally Abused:   Marland Kitchen Physically Abused:   . Sexually Abused:    Current Meds  Medication Sig  . Azelastine HCl 137 MCG/SPRAY SOLN SMARTSIG:2 Spray(s) Both Nares Twice Daily PRN  . Calcium Carbonate-Vit D-Min (CALCIUM 1200 PO) Take 2-3 tablets by mouth daily. Reported on 05/26/2015  . senna-docusate (SENOKOT-S) 8.6-50 MG tablet Take 2 tablets by mouth 2 (two) times daily.   Allergies  Allergen Reactions  . Lac Bovis Other (See Comments)  . Milk-Related Compounds   . Omeprazole Other (See Comments)    Pt reports "Chest pain, HA and Dysuria."  . Penicillins Itching and Rash   Recent Results (from the past 2160 hour(s))  Urinalysis, Complete w Microscopic     Status: Abnormal   Collection Time: 08/29/19  5:59 AM  Result Value Ref Range   Color, Urine YELLOW (A) YELLOW   APPearance CLEAR (A) CLEAR   Specific Gravity, Urine 1.006 1.005 - 1.030   pH 7.0 5.0 - 8.0   Glucose, UA NEGATIVE NEGATIVE mg/dL   Hgb urine dipstick SMALL (A) NEGATIVE   Bilirubin Urine NEGATIVE NEGATIVE   Ketones, ur NEGATIVE NEGATIVE mg/dL   Protein, ur NEGATIVE NEGATIVE mg/dL   Nitrite NEGATIVE NEGATIVE   Leukocytes,Ua TRACE (A) NEGATIVE   RBC / HPF 0-5 0 - 5 RBC/hpf   WBC, UA 0-5 0 - 5 WBC/hpf   Bacteria, UA NONE SEEN NONE SEEN   Squamous Epithelial / LPF 0-5 0 - 5   Mucus PRESENT     Comment: Performed at [facility], [address] [place], [place] [address]  CBC with Differential     Status: None   Collection Time: 08/29/19  6:00 AM  Result Value Ref Range   WBC 6.3 4.0 - 10.5 K/uL   RBC 4.74 3.87 - 5.11 MIL/uL   Hemoglobin 14.1 12.0 - 15.0 g/dL   HCT 60.4 36 - 46 %   MCV 88.8 80.0 - 100.0 fL   MCH 29.7 26.0 - 34.0 pg   MCHC 33.5  30.0 - 36.0 g/dL   RDW 54.0 98.1 - 19.1 %   Platelets 218 150 -  400 K/uL   nRBC 0.0 0.0 - 0.2 %   Neutrophils Relative % 67 %   Neutro Abs 4.3 1.7 - 7.7 K/uL   Lymphocytes Relative 23 %   Lymphs Abs 1.4 0.7 - 4.0 K/uL   Monocytes Relative 8 %   Monocytes Absolute 0.5 0 - 1 K/uL   Eosinophils Relative 1 %   Eosinophils Absolute 0.1 0 - 0 K/uL   Basophils Relative 1 %   Basophils Absolute 0.0 0 - 0 K/uL   Immature Granulocytes 0 %   Abs Immature Granulocytes 0.02 0.00 - 0.07 K/uL    Comment: Performed at [facility], [address] [place], [place] [address]  Comprehensive metabolic panel     Status: Abnormal   Collection Time: 08/29/19  6:00 AM  Result Value Ref Range   Sodium 142 135 - 145 mmol/L   Potassium 4.9 3.5 - 5.1 mmol/L   Chloride 108 98 - 111 mmol/L   CO2 27 22 - 32 mmol/L   Glucose, Bld 106 (H) 70 - 99 mg/dL    Comment: Glucose reference range applies only to samples taken after fasting for at least 8 hours.   BUN 12 6 - 20 mg/dL   Creatinine, Ser 6.04 0.44 - 1.00 mg/dL   Calcium 9.9 8.9 - 54.0 mg/dL   Total Protein 8.4 (H) 6.5 - 8.1 g/dL   Albumin 4.7 3.5 - 5.0 g/dL   AST 26 15 - 41 U/L   ALT 31 0 - 44 U/L   Alkaline Phosphatase 61 38 - 126 U/L   Total Bilirubin 0.9 0.3 - 1.2 mg/dL   GFR calc non Af Amer >60 >60 mL/min   GFR calc Af Amer >60 >60 mL/min   Anion gap 7 5 - 15    Comment: Performed at [facility], [address] [place], [place] [address]  Lipase, blood     Status: Abnormal   Collection Time: 08/29/19  6:00 AM  Result Value Ref Range   Lipase 85 (H) 11 - 51 U/L    Comment: Performed at [facility], [address] [place], [place] [address]  CBC     Status: None   Collection Time: 09/13/19  8:09 PM  Result Value Ref Range   WBC 6.0 4.0 - 10.5 K/uL   RBC 4.48 3.87 - 5.11 MIL/uL   Hemoglobin 13.5 12.0 - 15.0 g/dL   HCT 95.6 36 - 46 %   MCV 90.8 80.0 - 100.0 fL   MCH 30.1 26.0 - 34.0 pg   MCHC 33.2 30.0 -  36.0 g/dL   RDW 21.3 08.6 - 57.8 %   Platelets 224 150 - 400 K/uL   nRBC 0.0 0.0 - 0.2 %    Comment: Performed at [facility], [address], [place], [place] [address]  Troponin I (High Sensitivity)     Status: None   Collection Time: 09/13/19  8:09 PM  Result Value Ref Range   Troponin I (High Sensitivity) 4 <18 ng/L    Comment: (NOTE) Elevated high sensitivity troponin I (hsTnI) values and significant  changes across serial measurements may suggest ACS but many other  chronic and acute conditions are known to elevate hsTnI results.  Refer to the "Links" section for chest pain algorithms and additional  guidance. Performed at [facility], [address] [place], [place] [address]   Comprehensive metabolic panel     Status: None   Collection Time: 09/13/19  8:09  PM  Result Value Ref Range   Sodium 143 135 - 145 mmol/L   Potassium 4.0 3.5 - 5.1 mmol/L   Chloride 105 98 - 111 mmol/L   CO2 31 22 - 32 mmol/L   Glucose, Bld 98 70 - 99 mg/dL    Comment: Glucose reference range applies only to samples taken after fasting for at least 8 hours.   BUN 14 6 - 20 mg/dL   Creatinine, Ser 7.82 0.44 - 1.00 mg/dL   Calcium 9.8 8.9 - 95.6 mg/dL   Total Protein 7.9 6.5 - 8.1 g/dL   Albumin 4.5 3.5 - 5.0 g/dL   AST 32 15 - 41 U/L   ALT 35 0 - 44 U/L   Alkaline Phosphatase 59 38 - 126 U/L   Total Bilirubin 0.6 0.3 - 1.2 mg/dL   GFR calc non Af Amer >60 >60 mL/min   GFR calc Af Amer >60 >60 mL/min   Anion gap 7 5 - 15    Comment: Performed at [facility], [address] [place], [place] [address]  Urinalysis, Complete w Microscopic     Status: Abnormal   Collection Time: 09/13/19  8:10 PM  Result Value Ref Range   Color, Urine YELLOW (A) YELLOW   APPearance CLOUDY (A) CLEAR   Specific Gravity, Urine 1.004 (L) 1.005 - 1.030   pH 8.0 5.0 - 8.0   Glucose, UA NEGATIVE NEGATIVE mg/dL   Hgb urine dipstick NEGATIVE NEGATIVE   Bilirubin Urine NEGATIVE NEGATIVE    Ketones, ur NEGATIVE NEGATIVE mg/dL   Protein, ur NEGATIVE NEGATIVE mg/dL   Nitrite NEGATIVE NEGATIVE   Leukocytes,Ua NEGATIVE NEGATIVE   RBC / HPF 0-5 0 - 5 RBC/hpf   WBC, UA 0-5 0 - 5 WBC/hpf   Bacteria, UA RARE (A) NONE SEEN   Squamous Epithelial / LPF NONE SEEN 0 - 5    Comment: Performed at [facility], [address] [place], [place] [address]  Bladder Scan (Post Void Residual) in office     Status: None   Collection Time: 09/17/19  2:59 PM  Result Value Ref Range   Scan Result 0   Urinalysis, Complete     Status: Abnormal   Collection Time: 09/17/19  3:56 PM  Result Value Ref Range   Specific Gravity, UA 1.020 1.005 - 1.030   pH, UA 7.5 5.0 - 7.5   Color, UA Yellow Yellow   Appearance Ur Cloudy (A) Clear   Leukocytes,UA Negative Negative   Protein,UA Negative Negative/Trace   Glucose, UA Negative Negative   Ketones, UA Negative Negative   RBC, UA Trace (A) Negative   Bilirubin, UA Negative Negative   Urobilinogen, Ur 0.2 0.2 - 1.0 mg/dL   Nitrite, UA Negative Negative   Microscopic Examination See below:   Microscopic Examination     Status: Abnormal   Collection Time: 09/17/19  3:56 PM   URINE  Result Value Ref Range   WBC, UA 0-5 0 - 5 /hpf   RBC 0-2 0 - 2 /hpf   Epithelial Cells (non renal) 0-10 0 - 10 /hpf   Crystals Present (A) N/A   Crystal Type Amorphous Sediment N/A   Bacteria, UA Few None seen/Few  Lipid panel     Status: Abnormal   Collection Time: 09/24/19  8:31 AM  Result Value Ref Range   Cholesterol 194 0 - 200 mg/dL    Comment: ATP III Classification       Desirable:  < 200  mg/dL               Borderline High:  200 - 239 mg/dL          High:  > = 478 mg/dL   Triglycerides 29.5 0 - 149 mg/dL    Comment: Normal:  <621 mg/dLBorderline High:  150 - 199 mg/dL   HDL 30.86 >57.84 mg/dL   VLDL 9.8 0.0 - 69.6 mg/dL   LDL Cholesterol 295 (H) 0 - 99 mg/dL   Total CHOL/HDL Ratio 3     Comment:                Men          Women1/2 Average Risk      3.4          3.3Average Risk          5.0          4.42X Average Risk          9.6          7.13X Average Risk          15.0          11.0                       NonHDL 125.96     Comment: NOTE:  Non-HDL goal should be 30 mg/dL higher than patient's LDL goal (i.e. LDL goal of < 70 mg/dL, would have non-HDL goal of < 100 mg/dL)  Hemoglobin M8U     Status: None   Collection Time: 09/24/19  8:31 AM  Result Value Ref Range   Hgb A1c MFr Bld 5.6 4.6 - 6.5 %    Comment: Glycemic Control Guidelines for People with Diabetes:Non Diabetic:  <6%Goal of Therapy: <7%Additional Action Suggested:  >8%   Comprehensive metabolic panel     Status: None   Collection Time: 09/24/19  8:31 AM  Result Value Ref Range   Sodium 140 135 - 145 mEq/L   Potassium 3.8 3.5 - 5.1 mEq/L   Chloride 104 96 - 112 mEq/L   CO2 29 19 - 32 mEq/L   Glucose, Bld 86 70 - 99 mg/dL   BUN 14 6 - 23 mg/dL   Creatinine, Ser 1.32 0.40 - 1.20 mg/dL   Total Bilirubin 0.6 0.2 - 1.2 mg/dL   Alkaline Phosphatase 58 39 - 117 U/L   AST 20 0 - 37 U/L   ALT 27 0 - 35 U/L   Total Protein 7.7 6.0 - 8.3 g/dL   Albumin 4.7 3.5 - 5.2 g/dL   GFR 44.01 >02.72 mL/min   Calcium 9.9 8.4 - 10.5 mg/dL  Bladder Scan (Post Void Residual) in office     Status: None   Collection Time: 09/25/19  2:25 PM  Result Value Ref Range   Scan Result 0   Urinalysis, Complete     Status: Abnormal   Collection Time: 09/25/19  2:33 PM  Result Value Ref Range   Specific Gravity, UA 1.020 1.005 - 1.030   pH, UA 7.5 5.0 - 7.5   Color, UA Yellow Yellow   Appearance Ur Cloudy (A) Clear   Leukocytes,UA Negative Negative   Protein,UA Negative Negative/Trace   Glucose, UA Negative Negative   Ketones, UA Negative Negative   RBC, UA Negative Negative   Bilirubin, UA Negative Negative   Urobilinogen, Ur 0.2 0.2 - 1.0 mg/dL   Nitrite, UA Negative Negative   Microscopic Examination See  below:   Microscopic Examination     Status: Abnormal   Collection Time: 09/25/19   2:33 PM   Urine  Result Value Ref Range   WBC, UA 6-10 (A) 0 - 5 /hpf   RBC 0-2 0 - 2 /hpf   Epithelial Cells (non renal) 0-10 0 - 10 /hpf   Renal Epithel, UA 0-10 (A) None seen /hpf   Casts Present (A) None seen /lpf   Cast Type Granular casts (A) N/A    Comment: Hyaline casts   Crystals Present (A) N/A   Crystal Type Amorphous Sediment N/A   Bacteria, UA Moderate (A) None seen/Few  CULTURE, URINE COMPREHENSIVE     Status: Abnormal   Collection Time: 09/25/19  2:45 PM   Specimen: Urine   UR  Result Value Ref Range   Urine Culture, Comprehensive Final report (A)    Organism ID, Bacteria Comment (A)     Comment: Streptococcus mitis/oralis group Greater than 100,000 colony forming units per mL Susceptibility not normally performed on this organism.   BLADDER SCAN AMB NON-IMAGING     Status: None   Collection Time: 10/08/19  2:38 PM  Result Value Ref Range   Scan Result   Exercise Tolerance Test     Status: None   Collection Time: 10/13/19  2:52 PM  Result Value Ref Range   Rest HR 84 bpm   Rest BP 113/62 mmHg   RPE 15    Exercise duration (sec) 18 sec   Percent HR 97 %   Exercise duration (min) 5 min   Estimated workload 7.0 METS   Peak HR 164 bpm   Peak BP 190/94 mmHg   MPHR 169 bpm  POCT Urinalysis Dipstick     Status: Normal   Collection Time: 10/22/19  3:46 PM  Result Value Ref Range   Color, UA yellow    Clarity, UA clear    Glucose, UA Negative Negative   Bilirubin, UA neg    Ketones, UA neg    Spec Grav, UA 1.010 1.010 - 1.025   Blood, UA neg    pH, UA 7.5 5.0 - 8.0   Protein, UA Negative Negative   Urobilinogen, UA     Nitrite, UA neg    Leukocytes, UA Negative Negative   Appearance     Odor    POCT Wet Prep with KOH     Status: Normal   Collection Time: 10/22/19  3:46 PM  Result Value Ref Range   Trichomonas, UA Negative    Clue Cells Wet Prep HPF POC neg    Epithelial Wet Prep HPF POC     Yeast Wet Prep HPF POC neg    Bacteria Wet Prep  HPF POC     RBC Wet Prep HPF POC     WBC Wet Prep HPF POC     KOH Prep POC Negative Negative  Lipase, blood     Status: None   Collection Time: 10/26/19  8:39 AM  Result Value Ref Range   Lipase 41 11 - 51 U/L    Comment: Performed at [facility], [address] [place], [place] [address]  Comprehensive metabolic panel     Status: Abnormal   Collection Time: 10/26/19  8:39 AM  Result Value Ref Range   Sodium 140 135 - 145 mmol/L   Potassium 4.5 3.5 - 5.1 mmol/L   Chloride 105 98 - 111 mmol/L   CO2 27 22 - 32 mmol/L  Glucose, Bld 104 (H) 70 - 99 mg/dL    Comment: Glucose reference range applies only to samples taken after fasting for at least 8 hours.   BUN 12 6 - 20 mg/dL   Creatinine, Ser 4.780.64 0.44 - 1.00 mg/dL   Calcium 9.4 8.9 - 29.510.3 mg/dL   Total Protein 7.9 6.5 - 8.1 g/dL   Albumin 4.4 3.5 - 5.0 g/dL   AST 27 15 - 41 U/L   ALT 26 0 - 44 U/L   Alkaline Phosphatase 63 38 - 126 U/L   Total Bilirubin 0.7 0.3 - 1.2 mg/dL   GFR calc non Af Amer >60 >60 mL/min   GFR calc Af Amer >60 >60 mL/min   Anion gap 8 5 - 15    Comment: Performed at [facility], [address] [place], [place] [address]  CBC     Status: None   Collection Time: 10/26/19  8:39 AM  Result Value Ref Range   WBC 6.6 4.0 - 10.5 K/uL   RBC 4.64 3.87 - 5.11 MIL/uL   Hemoglobin 14.0 12.0 - 15.0 g/dL   HCT 86.541.7 36 - 46 %   MCV 89.9 80.0 - 100.0 fL   MCH 30.2 26.0 - 34.0 pg   MCHC 33.6 30.0 - 36.0 g/dL   RDW 78.412.7 69.611.5 - 29.515.5 %   Platelets 232 150 - 400 K/uL   nRBC 0.0 0.0 - 0.2 %    Comment: Performed at [facility], [address] [place], [place] [address]  Urinalysis, Complete w Microscopic     Status: Abnormal   Collection Time: 10/26/19  9:40 AM  Result Value Ref Range   Color, Urine YELLOW (A) YELLOW   APPearance HAZY (A) CLEAR   Specific Gravity, Urine 1.012 1.005 - 1.030   pH 6.0 5.0 - 8.0   Glucose, UA NEGATIVE NEGATIVE mg/dL   Hgb urine dipstick SMALL (A)  NEGATIVE   Bilirubin Urine NEGATIVE NEGATIVE   Ketones, ur 5 (A) NEGATIVE mg/dL   Protein, ur NEGATIVE NEGATIVE mg/dL   Nitrite NEGATIVE NEGATIVE   Leukocytes,Ua TRACE (A) NEGATIVE   RBC / HPF 0-5 0 - 5 RBC/hpf   WBC, UA 0-5 0 - 5 WBC/hpf   Bacteria, UA NONE SEEN NONE SEEN   Squamous Epithelial / LPF 0-5 0 - 5   Mucus PRESENT     Comment: Performed at [facility], [address] [place], [place] [address]  Pregnancy, urine POC     Status: None   Collection Time: 10/26/19  9:44 AM  Result Value Ref Range   Preg Test, Ur NEGATIVE NEGATIVE    Comment:        THE SENSITIVITY OF THIS METHODOLOGY IS >24 mIU/mL   ECHOCARDIOGRAM COMPLETE     Status: None   Collection Time: 10/31/19  3:02 PM  Result Value Ref Range   AR max vel 2.61 cm2   AV Peak grad 5.1 mmHg   Ao pk vel 1.13 m/s   S' Lateral 3.00 cm   Area-P 1/2 5.23 cm2   AV Area VTI 2.70 cm2   AV Mean grad 3.0 mmHg   Single Plane A4C EF 53.6 %   Single Plane A2C EF 52.5 %   Calc EF 53.2 %   AV Area mean vel 2.50 cm2   Objective  Body mass index is 21.03 kg/m. Wt Readings from Last 3 Encounters:  11/26/19 146 lb 9.6 oz (66.5 kg)  10/26/19 151 lb (68.5  kg)  10/22/19 151 lb (68.5 kg)   Temp Readings from Last 3 Encounters:  11/26/19 97.9 F (36.6 C) (Oral)  10/26/19 97.9 F (36.6 C) (Oral)  10/10/19 97.7 F (36.5 C) (Oral)   BP Readings from Last 3 Encounters:  11/26/19 124/80  10/26/19 118/76  10/22/19 110/80   Pulse Readings from Last 3 Encounters:  11/26/19 72  10/26/19 61  10/10/19 78    Physical Exam Vitals reviewed.  Constitutional:      Appearance: Normal appearance. She is well-developed and well-groomed.  HENT:     Head: Normocephalic and atraumatic.  Eyes:     Conjunctiva/sclera: Conjunctivae normal.     Pupils: Pupils are equal, round, and reactive to light.  Cardiovascular:     Rate and Rhythm: Normal rate and regular rhythm.     Heart sounds: Normal heart sounds. No murmur  heard.   Pulmonary:     Effort: Pulmonary effort is normal.     Breath sounds: Normal breath sounds.  Abdominal:     Tenderness: There is no right CVA tenderness or left CVA tenderness.  Skin:    General: Skin is warm and dry.  Neurological:     General: No focal deficit present.     Mental Status: She is alert and oriented to person, place, and time. Mental status is at baseline.     Gait: Gait normal.  Psychiatric:        Attention and Perception: Attention and perception normal.        Mood and Affect: Mood and affect normal.        Speech: Speech normal.        Behavior: Behavior normal. Behavior is cooperative.        Thought Content: Thought content normal.        Cognition and Memory: Cognition normal.        Judgment: Judgment normal.     Assessment  Plan  Acute cystitis without hematuria - Plan: Urinalysis, Routine w reflex microscopic, Urine Culture, ciprofloxacin (CIPRO) 500 MG  Bid x 5 days   Mid back pain - Plan: DG Thoracic Spine 2 View  Chronic low back pain, unspecified back pain laterality, unspecified whether sciatica present  F/u [facility] MRI L spine review   Provider: Dr. French Ana McLean-Scocuzza-Internal Medicine

## 2019-11-26 NOTE — Patient Instructions (Signed)
AZO with pyridium as needed burning use it 2-3 days total  Increase water    Urinary Tract Infection, Adult  A urinary tract infection (UTI) is an infection of any part of the urinary tract. The urinary tract includes the kidneys, ureters, bladder, and urethra. These organs make, store, and get rid of urine in the body. Your health care provider may use other names to describe the infection. An upper UTI affects the ureters and kidneys (pyelonephritis). A lower UTI affects the bladder (cystitis) and urethra (urethritis). What are the causes? Most urinary tract infections are caused by bacteria in your genital area, around the entrance to your urinary tract (urethra). These bacteria grow and cause inflammation of your urinary tract. What increases the risk? You are more likely to develop this condition if:  You have a urinary catheter that stays in place (indwelling).  You are not able to control when you urinate or have a bowel movement (you have incontinence).  You are female and you: ? Use a spermicide or diaphragm for birth control. ? Have low estrogen levels. ? Are pregnant.  You have certain genes that increase your risk (genetics).  You are sexually active.  You take antibiotic medicines.  You have a condition that causes your flow of urine to slow down, such as: ? An enlarged prostate, if you are female. ? Blockage in your urethra (stricture). ? A kidney stone. ? A nerve condition that affects your bladder control (neurogenic bladder). ? Not getting enough to drink, or not urinating often.  You have certain medical conditions, such as: ? Diabetes. ? A weak disease-fighting system (immunesystem). ? Sickle cell disease. ? Gout. ? Spinal cord injury. What are the signs or symptoms? Symptoms of this condition include:  Needing to urinate right away (urgently).  Frequent urination or passing small amounts of urine frequently.  Pain or burning with urination.  Blood  in the urine.  Urine that smells bad or unusual.  Trouble urinating.  Cloudy urine.  Vaginal discharge, if you are female.  Pain in the abdomen or the lower back. You may also have:  Vomiting or a decreased appetite.  Confusion.  Irritability or tiredness.  A fever.  Diarrhea. The first symptom in older adults may be confusion. In some cases, they may not have any symptoms until the infection has worsened. How is this diagnosed? This condition is diagnosed based on your medical history and a physical exam. You may also have other tests, including:  Urine tests.  Blood tests.  Tests for sexually transmitted infections (STIs). If you have had more than one UTI, a cystoscopy or imaging studies may be done to determine the cause of the infections. How is this treated? Treatment for this condition includes:  Antibiotic medicine.  Over-the-counter medicines to treat discomfort.  Drinking enough water to stay hydrated. If you have frequent infections or have other conditions such as a kidney stone, you may need to see a health care provider who specializes in the urinary tract (urologist). In rare cases, urinary tract infections can cause sepsis. Sepsis is a life-threatening condition that occurs when the body responds to an infection. Sepsis is treated in the hospital with IV antibiotics, fluids, and other medicines. Follow these instructions at home:  Medicines  Take over-the-counter and prescription medicines only as told by your health care provider.  If you were prescribed an antibiotic medicine, take it as told by your health care provider. Do not stop using the antibiotic even if  you start to feel better. General instructions  Make sure you: ? Empty your bladder often and completely. Do not hold urine for long periods of time. ? Empty your bladder after sex. ? Wipe from front to back after a bowel movement if you are female. Use each tissue one time when you  wipe.  Drink enough fluid to keep your urine pale yellow.  Keep all follow-up visits as told by your health care provider. This is important. Contact a health care provider if:  Your symptoms do not get better after 1-2 days.  Your symptoms go away and then return. Get help right away if you have:  Severe pain in your back or your lower abdomen.  A fever.  Nausea or vomiting. Summary  A urinary tract infection (UTI) is an infection of any part of the urinary tract, which includes the kidneys, ureters, bladder, and urethra.  Most urinary tract infections are caused by bacteria in your genital area, around the entrance to your urinary tract (urethra).  Treatment for this condition often includes antibiotic medicines.  If you were prescribed an antibiotic medicine, take it as told by your health care provider. Do not stop using the antibiotic even if you start to feel better.  Keep all follow-up visits as told by your health care provider. This is important. This information is not intended to replace advice given to you by your health care provider. Make sure you discuss any questions you have with your health care provider. Document Revised: 03/21/2018 Document Reviewed: 10/11/2017 Elsevier Patient Education  2020 [employer].  Asymptomatic Bacteriuria  Asymptomatic bacteriuria is the presence of a large number of bacteria in the urine without the usual symptoms of burning or frequent urination. What are the causes? This condition is caused by an increase in bacteria in the urine. This increase can be caused by:  Bacteria entering the urinary tract, such as during sex.  A blockage in the urinary tract, such as from kidney stones or a tumor.  Bladder problems that prevent the bladder from emptying. What increases the risk? You are more likely to develop this condition if:  You have diabetes mellitus.  You are an elderly adult, especially if you are also in a long-term  care facility.  You are pregnant and in the first trimester.  You have kidney stones.  You are female.  You have had a kidney transplant.  You have a leaky kidney tube valve (reflux).  You had a urinary catheter for a long period of time. What are the signs or symptoms? There are no symptoms of this condition. How is this diagnosed? This condition is diagnosed with a urine test. Because this condition does not cause symptoms, it is usually diagnosed when a urine sample is taken to treat or diagnose another condition, such as pregnancy or kidney problems. Most women who are in their first trimester of pregnancy are screened for asymptomatic bacteriuria. How is this treated? Usually, treatment is not needed for this condition. Treating the condition can lead to other problems, such as a yeast infection or the growth of bacteria that do not respond to treatment (antibiotic-resistant bacteria). Some people, such as pregnant women and people with kidney transplants, do need treatment with antibiotic medicines to prevent kidney infection (pyelonephritis). In pregnant women, kidney infection can lead to premature labor, fetal growth restriction, or newborn death. Follow these instructions at home: Medicines  Take over-the-counter and prescription medicines only as told by your health care provider.  If you were prescribed an antibiotic medicine, take it as told by your health care provider. Do not stop taking the antibiotic even if you start to feel better. General instructions  Monitor your condition for any changes.  Drink enough fluid to keep your urine clear or pale yellow.  Go to the bathroom more often to keep your bladder empty.  If you are female, keep the area around your vagina and rectum clean. Wipe yourself from front to back after urinating.  Keep all follow-up visits as told by your health care provider. This is important. Contact a health care provider if:  You notice  any new symptoms, such as back pain or burning while urinating. Get help right away if:  You develop signs of an infection such as: ? A burning sensation when you urinate. ? Have pain when you urinate. ? Develop an intense need to urinate. ? Urinating more frequently. ? Back pain or pelvic pain. ? Fever or chills.  You have blood in your urine.  Your urine becomes discolored or cloudy.  Your urine smells bad.  You have severe pain that cannot be controlled with medicine. Summary  Asymptomatic bacteriuria is the presence of a large number of bacteria in the urine without the usual symptoms of burning or frequent urination.  Usually, treatment is not needed for this condition. Treating the condition can lead to other problems, such as too much yeast and the growth of antibiotic-resistant bacteria.  Some people, such as pregnant women and people with kidney transplants, do need treatment with antibiotic medicines to prevent kidney infection (pyelonephritis).  If you were prescribed an antibiotic medicine, take it as told by your health care provider. Do not stop taking the antibiotic even if you start to feel better. This information is not intended to replace advice given to you by your health care provider. Make sure you discuss any questions you have with your health care provider. Document Revised: 07/23/2018 Document Reviewed: 03/28/2016 Elsevier Patient Education  2020 [employer].

## 2019-11-27 LAB — URINALYSIS, ROUTINE W REFLEX MICROSCOPIC
Bacteria, UA: NONE SEEN /HPF
Bilirubin Urine: NEGATIVE
Glucose, UA: NEGATIVE
Hgb urine dipstick: NEGATIVE
Hyaline Cast: NONE SEEN /LPF
Nitrite: NEGATIVE
Protein, ur: NEGATIVE
Specific Gravity, Urine: 1.018 (ref 1.001–1.03)
WBC, UA: NONE SEEN /HPF (ref 0–5)
pH: 7 (ref 5.0–8.0)

## 2019-11-27 LAB — URINE CULTURE
MICRO NUMBER:: 10814083
SPECIMEN QUALITY:: ADEQUATE

## 2019-12-02 ENCOUNTER — Telehealth: Payer: Self-pay | Admitting: Internal Medicine

## 2019-12-02 NOTE — Telephone Encounter (Signed)
Pt c/o of Chest Pain: STAT if CP now or developed within 24 hours  1. Are you having CP right now? yes  2. Are you experiencing any other symptoms (ex. SOB, nausea, vomiting, sweating)? No   3. How long have you been experiencing CP? 1 week  4. Is your CP continuous or coming and going?  Interim   5. Have you taken Nitroglycerin?  No but takes fluid pill  ?

## 2019-12-02 NOTE — Telephone Encounter (Signed)
Patient reports chest pain, on and off, for 1 week. Currently it is a "6-7" out of 10. Denies shortness of breath, nausea, vomiting, jaw pain or left arm pain. She reports some numbness and pain from neuropathy on her arms and feet at times. The pain is in the middle to left side of her chest and does not worsen in regards to meals. Has history of some scid reflux. Patient has not taken anything for that at this time. Denies any recent heavy lifting or moving that would have caused chest pain.  Patient also has been taking furosemide 20 mg daily for some swelling. She wonders if that is causing it.  Advised patient to try some Tums and tylenol to see if that helps with her pain. Her recent echo and stress test basically normal. Scheduled her to see Dr End tomorrow for reevaluation of chest pain and taking of furosemide.  In the meantime, pt verbalized understanding to call 911 or go to the emergency room, if she develops any new or worsening symptoms.

## 2019-12-03 ENCOUNTER — Ambulatory Visit (INDEPENDENT_AMBULATORY_CARE_PROVIDER_SITE_OTHER): Payer: BC Managed Care – PPO | Admitting: Internal Medicine

## 2019-12-03 ENCOUNTER — Encounter: Payer: Self-pay | Admitting: Internal Medicine

## 2019-12-03 ENCOUNTER — Other Ambulatory Visit: Payer: Self-pay

## 2019-12-03 ENCOUNTER — Ambulatory Visit: Payer: BC Managed Care – PPO | Admitting: Family Medicine

## 2019-12-03 VITALS — BP 100/75 | HR 67 | Ht 70.0 in | Wt 144.8 lb

## 2019-12-03 DIAGNOSIS — R079 Chest pain, unspecified: Secondary | ICD-10-CM | POA: Diagnosis not present

## 2019-12-03 DIAGNOSIS — R600 Localized edema: Secondary | ICD-10-CM | POA: Diagnosis not present

## 2019-12-03 MED ORDER — FAMOTIDINE 20 MG PO TABS
20.0000 mg | ORAL_TABLET | Freq: Two times a day (BID) | ORAL | Status: DC
Start: 2019-12-03 — End: 2020-11-12

## 2019-12-03 NOTE — Progress Notes (Signed)
Follow-up Outpatient Visit Date: 12/03/2019  Primary Care Provider: [name], FNP [address] [place] [address]  Chief Complaint: Chest pain  HPI:  [name] is a 49 y.o. female with history of asthma, GERD, and migraine headaches, who presents for urgent reevaluation of chest pain.  I last saw her in June, at which time she reported two distinct types of chest pain.  Subsequent exercise tolerance test was low risk.  Echo showed preserved LVEF with elevated CVP, prompting initiation of furosemide 20 mg daily.  Today, [name] notes that she has been having more chest pain over the last week.  It is an achy pain in the center of her chest but seems to worsen after eating.  She took an antacid yesterday and noticed relief.  She does not have any exertional chest pain or shortness of breath, palpitations, or edema.  She has experienced some intermittent pain in her left calf which has been attributed to a pinched nerve.  She is following with orthopedics and also has an appointment with vascular surgery out of concern for venous insufficiency.  She continues to have some lower extremity swelling though it has improved with addition of furosemide as well as regular compression stocking use.  --------------------------------------------------------------------------------------------------  Cardiovascular History & Procedures: Cardiovascular Problems:  Chest pain  Risk Factors:  None  Cath/PCI:  None  CV Surgery:  None  EP Procedures and Devices:  None  Non-Invasive Evaluation(s):  TTE (10/31/19): Normal LV size.  LVEF 50-55% with normal wall motion and diastolic function.  Normal RV size and function.  Mild mitral regurgitation.  CVP ~15 mmHg.  Exercise tolerance test (10/13/19): Low risk study without evidence of ischemia ([facility] Treadmill Score = +5).  Rare isolated PVC's noted during recovery.  Recent CV Pertinent Labs: Lab Results  Component  Value Date   CHOL 194 09/24/2019   HDL 67.70 09/24/2019   LDLCALC 116 (H) 09/24/2019   TRIG 49.0 09/24/2019   CHOLHDL 3 09/24/2019   K 4.5 10/26/2019   BUN 12 10/26/2019   CREATININE 0.64 10/26/2019    Past medical and surgical history were reviewed and updated in EPIC.  Current Meds  Medication Sig  . Azelastine HCl 137 MCG/SPRAY SOLN SMARTSIG:2 Spray(s) Both Nares Twice Daily PRN  . Calcium Carbonate-Vit D-Min (CALCIUM 1200 PO) Take 2-3 tablets by mouth daily. Reported on 05/26/2015  . furosemide (LASIX) 20 MG tablet Take 20 mg by mouth daily.  . pregabalin (LYRICA) 50 MG capsule Take 50 mg by mouth 2 (two) times daily.  Marland Kitchen senna-docusate (SENOKOT-S) 8.6-50 MG tablet Take 2 tablets by mouth 2 (two) times daily. (Patient taking differently: Take 2 tablets by mouth daily. )    Allergies: Lac bovis, Milk-related compounds, Omeprazole, and Penicillins  Social History   Tobacco Use  . Smoking status: Never Smoker  . Smokeless tobacco: Never Used  Vaping Use  . Vaping Use: Never used  Substance Use Topics  . Alcohol use: No  . Drug use: No    Family History  Problem Relation Age of Onset  . Colon cancer Father 46  . Arthritis Father   . Hyperlipidemia Father   . Transient ischemic attack Father   . Cancer - Colon Father 51  . Prostate cancer Father 17  . Valvular heart disease Father   . Arthritis Mother   . Heart disease Mother   . Stroke Mother        TIA  . Hypertension Mother   .  Diabetes Mother   . Kidney cancer Mother 70  . Heart failure Mother   . Hyperlipidemia Mother   . Cancer Maternal Grandmother   . Cancer Paternal Grandfather        prostate  . Breast cancer Cousin 35       has contact    Review of Systems: A 12-system review of systems was performed and was negative except as noted in the HPI.  --------------------------------------------------------------------------------------------------  Physical Exam: BP 100/75 (BP Location: Left Arm,  Patient Position: Sitting, Cuff Size: Normal)   Pulse 67   Ht 5\' 10"  (1.778 m)   Wt 144 lb 12.8 oz (65.7 kg)   SpO2 98%   BMI 20.78 kg/m   General: NAD. Neck: No JVD or HJR. Lungs: Clear to auscultation bilaterally without wheezes or crackles. Heart: Regular rate and rhythm without murmurs, rubs, gallops. Abdomen: Soft, nontender, nondistended. Extremities: Trace ankle edema bilaterally.  2+ posterior tibial and dorsalis pedis pulses bilaterally.  EKG: Normal sinus rhythm without significant abnormality.  No significant change since 09/18/2019.  Lab Results  Component Value Date   WBC 6.6 10/26/2019   HGB 14.0 10/26/2019   HCT 41.7 10/26/2019   MCV 89.9 10/26/2019   PLT 232 10/26/2019    Lab Results  Component Value Date   NA 140 10/26/2019   K 4.5 10/26/2019   CL 105 10/26/2019   CO2 27 10/26/2019   BUN 12 10/26/2019   CREATININE 0.64 10/26/2019   GLUCOSE 104 (H) 10/26/2019   ALT 26 10/26/2019    Lab Results  Component Value Date   CHOL 194 09/24/2019   HDL 67.70 09/24/2019   LDLCALC 116 (H) 09/24/2019   TRIG 49.0 09/24/2019   CHOLHDL 3 09/24/2019    --------------------------------------------------------------------------------------------------  ASSESSMENT AND PLAN: Chest pain: Symptoms are not cardiac based on the patient's description and are most consistent with GERD.  I recommended that the patient begin taking famotidine 20 mg twice daily.  She already has follow-up scheduled with her gastroenterologist next month.  If she continues to have pain, particularly if it were to become more suggestive of angina, cardiac CTA may be necessary.  However, given noncardiac description of the pain and low risk exercise tolerance test, further testing will be deferred at this time.  Lower extremity edema: Ongoing element of HFpEF based on elevated CVP as well as venous insufficiency.  I have encouraged [name] to continue using compression stockings and low-dose  furosemide.  She should follow-up with vascular surgery.  Follow-up: Return to clinic in 4-6 weeks.Arlana Pouch Leldon Steege, MD 12/03/2019 4:08 PM

## 2019-12-03 NOTE — Patient Instructions (Signed)
Medication Instructions:  Your physician has recommended you make the following change in your medication:  1- START Famotidine 20 mg by mouth two times a day (over-the-counter).  *If you need a refill on your cardiac medications before your next appointment, please call your pharmacy*  Follow-Up: At [facility], you and your health needs are our priority.  As part of our continuing mission to provide you with exceptional heart care, we have created designated Provider Care Teams.  These Care Teams include your primary Cardiologist (physician) and Advanced Practice Providers (APPs -  Physician Assistants and Nurse Practitioners) who all work together to provide you with the care you need, when you need it.  We recommend signing up for the patient portal called "MyChart".  Sign up information is provided on this After Visit Summary.  MyChart is used to connect with patients for Virtual Visits (Telemedicine).  Patients are able to view lab/test results, encounter notes, upcoming appointments, etc.  Non-urgent messages can be sent to your provider as well.   To learn more about what you can do with MyChart, go to [email or web address].    Your next appointment:   4-6 week(s)  The format for your next appointment:   In Person  Provider:    You may see DR Cristal Deer END or one of the following Advanced Practice Providers on your designated Care Team:    Nicolasa Ducking, NP  Eula Listen, PA-C  Marisue Ivan, PA-C

## 2019-12-04 ENCOUNTER — Telehealth: Payer: Self-pay | Admitting: Internal Medicine

## 2019-12-04 ENCOUNTER — Other Ambulatory Visit: Payer: Self-pay | Admitting: Neurology

## 2019-12-04 ENCOUNTER — Telehealth: Payer: Self-pay | Admitting: Urology

## 2019-12-04 ENCOUNTER — Encounter: Payer: Self-pay | Admitting: *Deleted

## 2019-12-04 DIAGNOSIS — Z01810 Encounter for preprocedural cardiovascular examination: Secondary | ICD-10-CM

## 2019-12-04 DIAGNOSIS — R202 Paresthesia of skin: Secondary | ICD-10-CM

## 2019-12-04 DIAGNOSIS — R200 Anesthesia of skin: Secondary | ICD-10-CM

## 2019-12-04 DIAGNOSIS — R079 Chest pain, unspecified: Secondary | ICD-10-CM

## 2019-12-04 NOTE — Telephone Encounter (Signed)
Please call Cristina Wade and ask if she is still having dysuria and if she is still cathing.  If she is cathing, what have her residuals been.

## 2019-12-04 NOTE — Telephone Encounter (Signed)
Spoke to patient and she states she does not have burning and she is cathing 2 times daily. 200 at night 80-120 in the morning.

## 2019-12-04 NOTE — Telephone Encounter (Signed)
Patient calling back saying her chest pain is not improving. Took "acid pill" the famotidine last night and this morning. Reports the pain in the center of her chest feels better and is gone. Now the pain is "almost constant" on the left side of her heart. Left arm pain sometimes but she still attributes it to her neuropathy. Her and her sister walked for about 30 minutes today. Patient felt out of breath though her sister was fine and had no problems breathing. The pain has been occurring all day.  Advised I would discuss with Dr End and call her back with further recommendations.

## 2019-12-04 NOTE — Telephone Encounter (Signed)
Discussed with Dr End who advised patient to take some over the counter Tylenol, order Coronary CTA to be done as soon as possible and if new worsening symptoms to go to the ER.  Patient verbalized understanding of the above and below instructions. Copy of instructions to be mailed to patient.  Her last BP in office on 12/03/19 was 100/75, HR 67. Per protocol she would take Lopressor 100 mg 2 hours prior to CT.  Routing to Dr End to confirm is this dosing is ok.  Message sent to precert and Rhys Martini asking if CT can be expedited.    Your cardiac CT will be scheduled at one of the below locations:   [facility] [address] [place], [place] [address] [phone]  [place] [address] [place], [place] [address] [phone]  If scheduled at [facility], please arrive at the [facility] main entrance of [facility] 30 minutes prior to test start time. Proceed to the [facility] Radiology Department (first floor) to check-in and test prep.  If scheduled at [facility], please arrive 15 mins early for check-in and test prep.  Please follow these instructions carefully (unless otherwise directed):  Hold all erectile dysfunction medications at least 3 days (72 hrs) prior to test.  On the Night Before the Test: . Be sure to Drink plenty of water. . Do not consume any caffeinated/decaffeinated beverages or chocolate 12 hours prior to your test. . Do not take any antihistamines 12 hours prior to your test.  On the Day of the Test: . Drink plenty of water. Do not drink any water within one hour of the test. . Do not eat any food 4 hours prior to the test. . You may take your regular medications prior to the test.  . Take metoprolol (Lopressor) two hours prior to test. . HOLD Furosemide morning of the test. . FEMALES- please wear underwire-free bra if  available   *For Clinical Staff only. Please instruct patient the following:*        -Drink plenty of water       -Hold Furosemide/hydrochlorothiazide morning of the test       -Take metoprolol (Lopressor) 2 hours prior to test (if applicable).                  -If HR is less than 55 BPM- No Lopressor                -IF HR is greater than 55 BPM and patient is less than or equal to 40 yrs old Lopressor $RemoveBefor'100mg'[id number]$  x1.                -If HR is greater than 55 BPM and patient is greater than 40 yrs old Lopressor 50 mg x1.     Do not give Lopressor to patients with an allergy to lopressor or anyone with asthma or active COPD symptoms (currently taking steroids).       After the Test: . Drink plenty of water. . After receiving IV contrast, you may experience a mild flushed feeling. This is normal. . On occasion, you may experience a mild rash up to 24 hours after the test. This is not dangerous. If this occurs, you can take Benadryl 25 mg and increase your fluid intake. . If you experience trouble breathing, this can be serious. If it is severe call 911 IMMEDIATELY. If it  is mild, please call our office. . If you take any of these medications: Glipizide/Metformin, Avandament, Glucavance, please do not take 48 hours after completing test unless otherwise instructed.   Once we have confirmed authorization from your insurance company, we will call you to set up a date and time for your test. Based on how quickly your insurance processes prior authorizations requests, please allow up to 4 weeks to be contacted for scheduling your Cardiac CT appointment. Be advised that routine Cardiac CT appointments could be scheduled as many as 8 weeks after your provider has ordered it.  For non-scheduling related questions, please contact the cardiac imaging nurse navigator should you have any questions/concerns: Marchia Bond, Cardiac Imaging Nurse Navigator Burley Saver, Interim Cardiac Imaging Nurse [place] [facility] Direct Office Dial: [phone]   For scheduling needs, including cancellations and rescheduling, please call Vivien Rota at [phone], option 3.

## 2019-12-04 NOTE — Telephone Encounter (Signed)
Pt c/o Shortness Of Breath: STAT if SOB developed within the last 24 hours or pt is noticeably SOB on the phone  1. Are you currently SOB (can you hear that pt is SOB on the phone)? Not at this time   2. How long have you been experiencing SOB? A week or more  3. Are you SOB when sitting or when up moving around? Moving around - after about 30 minutes of walking  4. Are you currently experiencing any other symptoms? Pain in heart

## 2019-12-05 NOTE — Telephone Encounter (Signed)
I agree with plan, as outline below, including metoprolol dosing based on cardiac CTA protocol.  Yvonne Kendall, MD [facility]

## 2019-12-08 NOTE — Telephone Encounter (Signed)
No answer. Left message to call back.   

## 2019-12-08 NOTE — Telephone Encounter (Signed)
Spoke with patient and reviewed all information listed in previous notes. She has still not received letter but advised if she needs to come by office we can print another letter for her. She verbalized all instructions and verbalized understanding that if pain persists or worsens then go to ED. She verbalized understanding of our conversation with no further questions. Will make nurse aware that we spoke and inquire about labs & medication.

## 2019-12-08 NOTE — Telephone Encounter (Signed)
Patient calling back, is mainly available in PM

## 2019-12-09 MED ORDER — METOPROLOL TARTRATE 100 MG PO TABS: 100.0000 mg | ORAL_TABLET | Freq: Once | ORAL | 0 refills | Status: DC

## 2019-12-09 NOTE — Telephone Encounter (Signed)
Lopressor 100 mg by mouth x1 to take 2 hours prior to CT sent to pharmacy.  Letter has already been mailed last Friday.

## 2019-12-10 ENCOUNTER — Encounter: Payer: Self-pay | Admitting: *Deleted

## 2019-12-11 ENCOUNTER — Other Ambulatory Visit
Admission: RE | Admit: 2019-12-11 | Discharge: 2019-12-11 | Disposition: A | Discharge status: Home / Self Care | Payer: BC Managed Care – PPO | Source: Ambulatory Visit | Attending: Internal Medicine | Admitting: Internal Medicine

## 2019-12-11 DIAGNOSIS — Z01810 Encounter for preprocedural cardiovascular examination: Secondary | ICD-10-CM | POA: Diagnosis present

## 2019-12-11 DIAGNOSIS — R079 Chest pain, unspecified: Secondary | ICD-10-CM | POA: Diagnosis not present

## 2019-12-11 LAB — BASIC METABOLIC PANEL
Anion gap: 10 (ref 5–15)
BUN: 12 mg/dL (ref 6–20)
CO2: 27 mmol/L (ref 22–32)
Calcium: 9.4 mg/dL (ref 8.9–10.3)
Chloride: 101 mmol/L (ref 98–111)
Creatinine, Ser: 0.64 mg/dL (ref 0.44–1.00)
GFR calc Af Amer: >60 mL/min (ref >60)
GFR calc non Af Amer: >60 mL/min (ref >60)
Glucose, Bld: 89 mg/dL (ref 70–99)
Potassium: 4.8 mmol/L (ref 3.5–5.1)
Sodium: 138 mmol/L (ref 135–145)

## 2019-12-12 ENCOUNTER — Encounter: Payer: Self-pay | Admitting: *Deleted

## 2019-12-17 ENCOUNTER — Telehealth (HOSPITAL_COMMUNITY): Payer: Self-pay | Admitting: Emergency Medicine

## 2019-12-17 NOTE — Telephone Encounter (Signed)
Attempted to call patient regarding upcoming cardiac CT appointment. °Left message on voicemail with name and callback number °Gennett Garcia RN Navigator Cardiac Imaging °[facility] °[phone] Office °[phone] Cell ° °

## 2019-12-17 NOTE — Telephone Encounter (Signed)
Pt returning phone call regarding upcoming cardiac imaging study; pt verbalizes understanding of appt date/time, parking situation and where to check in, pre-test NPO status and medications ordered, and verified current allergies; name and call back number provided for further questions should they arise Haydee Jabbour RN Navigator Cardiac Imaging [facility] [phone] office [phone] cell   

## 2019-12-18 ENCOUNTER — Ambulatory Visit
Admission: RE | Admit: 2019-12-18 | Discharge: 2019-12-18 | Disposition: A | Discharge status: Home / Self Care | Payer: BC Managed Care – PPO | Source: Ambulatory Visit | Attending: Internal Medicine | Admitting: Internal Medicine

## 2019-12-18 ENCOUNTER — Other Ambulatory Visit: Payer: Self-pay

## 2019-12-18 DIAGNOSIS — R079 Chest pain, unspecified: Secondary | ICD-10-CM

## 2019-12-18 MED ORDER — NITROGLYCERIN 0.4 MG SL SUBL
0.8000 mg | SUBLINGUAL_TABLET | Freq: Once | SUBLINGUAL | Status: AC
  Administered 2019-12-18: 0.8 mg via SUBLINGUAL

## 2019-12-18 MED ORDER — IOHEXOL 350 MG/ML SOLN
75.0000 mL | Freq: Once | INTRAVENOUS | Status: AC | PRN
  Administered 2019-12-18: 75 mL via INTRAVENOUS

## 2019-12-18 NOTE — Progress Notes (Signed)
Patient tolerated CT well. Drank water after. Ambulated to exit steady gait.  

## 2019-12-23 ENCOUNTER — Ambulatory Visit
Admission: RE | Admit: 2019-12-23 | Discharge: 2019-12-23 | Disposition: A | Discharge status: Home / Self Care | Payer: BC Managed Care – PPO | Source: Ambulatory Visit | Attending: Neurology | Admitting: Neurology

## 2019-12-23 ENCOUNTER — Other Ambulatory Visit: Payer: Self-pay

## 2019-12-23 DIAGNOSIS — R202 Paresthesia of skin: Secondary | ICD-10-CM | POA: Insufficient documentation

## 2019-12-23 DIAGNOSIS — R200 Anesthesia of skin: Secondary | ICD-10-CM

## 2019-12-23 MED ORDER — GADOBUTROL 1 MMOL/ML IV SOLN
6.0000 mL | Freq: Once | INTRAVENOUS | Status: AC | PRN
  Administered 2019-12-23: 6 mL via INTRAVENOUS

## 2019-12-23 MED ORDER — GADOBUTROL 1 MMOL/ML IV SOLN: 6.0000 mL | Freq: Once | INTRAVENOUS | Status: DC | PRN

## 2019-12-24 DIAGNOSIS — I890 Lymphedema, not elsewhere classified: Secondary | ICD-10-CM | POA: Insufficient documentation

## 2019-12-24 DIAGNOSIS — I872 Venous insufficiency (chronic) (peripheral): Secondary | ICD-10-CM | POA: Insufficient documentation

## 2019-12-24 NOTE — Progress Notes (Signed)
MRN : [id number]  Rheda Kassab is a 49 y.o. (1970-11-20) female who presents with chief complaint of No chief complaint on file. Marland Kitchen  History of Present Illness:   Patient is seen for evaluation of leg pain and leg swelling. The patient first noticed the swelling remotely. The swelling is associated with pain and discoloration. The pain and swelling worsens with prolonged dependency and improves with elevation. The pain is unrelated to activity.  The patient notes that in the morning the legs are significantly improved but they steadily worsened throughout the course of the day. The patient also notes a steady worsening of the discoloration in the ankle and shin area.   The patient denies claudication symptoms.  The patient denies symptoms consistent with rest pain.  The patient denies and extensive history of DJD and LS spine disease.  The patient has no had any past angiography, interventions or vascular surgery.  Elevation makes the leg symptoms better, dependency makes them much worse. There is no history of ulcerations. The patient denies any recent changes in medications.  The patient has not been wearing graduated compression.  The patient denies a history of DVT or PE. There is no prior history of phlebitis. There is no history of primary lymphedema.  No history of malignancies. No history of trauma or groin or pelvic surgery. There is no history of radiation treatment to the groin or pelvis  The patient denies amaurosis fugax or recent TIA symptoms. There are no recent neurological changes noted. The patient denies recent episodes of angina or shortness of breath  No outpatient medications have been marked as taking for the 12/25/19 encounter (Appointment) with Gilda Crease, Latina Craver, MD.    Past Medical History:  Diagnosis Date  . Asthma   . Chronic pelvic pain in female 2018  . Cystitis   . Cystocele   . Endometrial polyp   . Endometriosis    per pt report but never seen  during surg  . Frequent headaches   . Gastritis   . GERD (gastroesophageal reflux disease)    rare  . Gross hematuria   . History of kidney stones   . Microscopic hematuria   . Migraine   . Migraines   . Osteopenia   . Urinary disorder   . UTI (lower urinary tract infection)     Past Surgical History:  Procedure Laterality Date  . COLONOSCOPY N/A 11/05/2014   Procedure: COLONOSCOPY;  Surgeon: Scot Jun, MD;  Location: [facility] ENDOSCOPY;  Service: Endoscopy;  Laterality: N/A;  . COLONOSCOPY  11/2016   Dr. Mechele Collin  . CYSTOSCOPY  2007   with biopsy   . CYSTOSCOPY N/A 06/01/2016   Procedure: CYSTOSCOPY;  Surgeon: Vena Austria, MD;  Location: [facility] ORS;  Service: Gynecology;  Laterality: N/A;  . DIAGNOSTIC LAPAROSCOPY    . DILATATION & CURETTAGE/HYSTEROSCOPY WITH MYOSURE N/A 04/13/2015   Procedure: DILATATION & CURETTAGE/HYSTEROSCOPY WITH MYOSURE/POLYPECTOMY;  Surgeon: Elenora Fender Ward, MD;  Location: [facility] ORS;  Service: Gynecology;  Laterality: N/A;  . DILATATION & CURETTAGE/HYSTEROSCOPY WITH MYOSURE N/A 06/01/2016   Procedure: DILATATION & CURETTAGE/HYSTEROSCOPY WITH MYOSURE;  Surgeon: Vena Austria, MD;  Location: [facility] ORS;  Service: Gynecology;  Laterality: N/A;  . GUM SURGERY    . laproscopy  2007  . POLYPECTOMY     endometrial    Social History Social History   Tobacco Use  . Smoking status: Never Smoker  . Smokeless tobacco: Never Used  Vaping Use  . Vaping Use: Never used  Substance Use Topics  . Alcohol use: No  . Drug use: No    Family History Family History  Problem Relation Age of Onset  . Colon cancer Father 53  . Arthritis Father   . Hyperlipidemia Father   . Transient ischemic attack Father   . Cancer - Colon Father 17  . Prostate cancer Father 90  . Valvular heart disease Father   . Arthritis Mother   . Heart disease Mother   . Stroke Mother        TIA  . Hypertension Mother   . Diabetes Mother   . Kidney cancer Mother 32  . Heart  failure Mother   . Hyperlipidemia Mother   . Cancer Maternal Grandmother   . Cancer Paternal Grandfather        prostate  . Breast cancer Cousin 35       has contact  No family history of bleeding/clotting disorders, porphyria or autoimmune disease   Allergies  Allergen Reactions  . Lac Bovis Other (See Comments)  . Milk-Related Compounds   . Omeprazole Other (See Comments)    Pt reports "Chest pain, HA and Dysuria."  . Penicillins Itching and Rash     REVIEW OF SYSTEMS (Negative unless checked)  Constitutional: [] Weight loss  [] Fever  [] Chills Cardiac: [] Chest pain   [] Chest pressure   [] Palpitations   [] Shortness of breath when laying flat   [] Shortness of breath with exertion. Vascular:  [] Pain in legs with walking   [x] Pain in legs at rest  [] History of DVT   [] Phlebitis   [x] Swelling in legs   [] Varicose veins   [] Non-healing ulcers Pulmonary:   [] Uses home oxygen   [] Productive cough   [] Hemoptysis   [] Wheeze  [] COPD   [] Asthma Neurologic:  [] Dizziness   [] Seizures   [] History of stroke   [] History of TIA  [] Aphasia   [] Vissual changes   [] Weakness or numbness in arm   [x] Weakness or numbness in leg Musculoskeletal:   [] Joint swelling   [] Joint pain   [] Low back pain Hematologic:  [] Easy bruising  [] Easy bleeding   [] Hypercoagulable state   [] Anemic Gastrointestinal:  [] Diarrhea   [] Vomiting  [] Gastroesophageal reflux/heartburn   [] Difficulty swallowing. Genitourinary:  [] Chronic kidney disease   [] Difficult urination  [] Frequent urination   [] Blood in urine Skin:  [] Rashes   [] Ulcers  Psychological:  [] History of anxiety   []  History of major depression.  Physical Examination  There were no vitals filed for this visit. There is no height or weight on file to calculate BMI. Gen: WD/WN, NAD Head: [place]/AT, No temporalis wasting.  Ear/Nose/Throat: Hearing grossly intact, nares w/o erythema or drainage, poor dentition Eyes: PER, EOMI, sclera nonicteric.  Neck: Supple, no  masses.  No bruit or JVD.  Pulmonary:  Good air movement, clear to auscultation bilaterally, no use of accessory muscles.  Cardiac: RRR, normal S1, S2, no Murmurs. Vascular: scattered varicosities present bilaterally.  Mild venous stasis changes to the legs bilaterally.  2+ soft pitting edema Vessel Right Left  Radial Palpable Palpable  PT Palpable Palpable  DP Palpable Palpable  Gastrointestinal: soft, non-distended. No guarding/no peritoneal signs.  Musculoskeletal: M/S 5/5 throughout.  No deformity or atrophy.  Neurologic: CN 2-12 intact. Pain and light touch intact in extremities.  Symmetrical.  Speech is fluent. Motor exam as listed above. Psychiatric: Judgment intact, Mood & affect appropriate for pt's clinical situation. Dermatologic: Mild venous rashes no ulcers noted.  No changes consistent with cellulitis. Lymph : No lichenification  or skin changes of chronic lymphedema.  CBC Lab Results  Component Value Date   WBC 6.6 10/26/2019   HGB 14.0 10/26/2019   HCT 41.7 10/26/2019   MCV 89.9 10/26/2019   PLT 232 10/26/2019    BMET    Component Value Date/Time   NA 138 12/11/2019 1524   K 4.8 12/11/2019 1524   CL 101 12/11/2019 1524   CO2 27 12/11/2019 1524   GLUCOSE 89 12/11/2019 1524   BUN 12 12/11/2019 1524   CREATININE 0.64 12/11/2019 1524   CALCIUM 9.4 12/11/2019 1524   GFRNONAA >60 12/11/2019 1524   GFRAA >60 12/11/2019 1524   CrCl cannot be calculated (Unknown ideal weight.).  COAG No results found for: INR, PROTIME  Radiology DG Thoracic Spine 2 View  Result Date: 11/27/2019 CLINICAL DATA:  Intermittent mid back pain EXAM: THORACIC SPINE 2 VIEWS COMPARISON:  09/13/2019 FINDINGS: Frontal and lateral views of the thoracic spine demonstrate no fractures. Alignment is anatomic. Moderate spondylosis of the lower cervical spine and cervicothoracic junction. There is minimal spondylosis of the upper thoracic spine with disc space narrowing and osteophyte formation.  Paraspinal soft tissues are unremarkable. IMPRESSION: 1. Spondylosis of the lower cervical and upper thoracic spine as above. No acute bony abnormalities. Electronically Signed   By: Sharlet Salina M.D.   On: 11/27/2019 14:29   MR THORACIC SPINE WO CONTRAST  Result Date: 12/24/2019 CLINICAL DATA:  Initial evaluation for urinary retention, peripheral numbness and tingling. EXAM: MRI CERVICAL AND THORACIC SPINE WITHOUT AND WITH CONTRAST TECHNIQUE: Multiplanar and multiecho pulse sequences of the cervical spine, to include the craniocervical junction and cervicothoracic junction, and the thoracic spine, were obtained without and with intravenous contrast. Please note that postcontrast images of the thoracic spine were not performed. CONTRAST:  94mL GADAVIST GADOBUTROL 1 MMOL/ML IV SOLN COMPARISON:  Prior radiograph from 11/26/2019. FINDINGS: MRI CERVICAL SPINE FINDINGS Alignment: Straightening with smooth reversal of the normal cervical lordosis. No significant listhesis. Vertebrae: Vertebral body height maintained without acute or chronic fracture. Bone marrow signal intensity within normal limits. No discrete or worrisome osseous lesions. No abnormal marrow edema or enhancement. Cord: Overall, caliber of the cervical spinal cord is somewhat small, suggesting mild diffuse atrophy. Single small focus of possible cord signal abnormality within the central aspect of the cord at the level of C3-4, seen only on axial T2 weighted sequence (series 26, image 8). No other focal cord signal abnormality. No abnormal enhancement. Posterior Fossa, vertebral arteries, paraspinal tissues: Visualized brain and posterior fossa within normal limits. Craniocervical junction normal. Paraspinous and prevertebral soft tissues within normal limits. Normal flow voids seen within the vertebral arteries bilaterally. Disc levels: C2-C3: Negative interspace. Mild left greater than right facet hypertrophy. No significant canal or foraminal  stenosis. C3-C4: Diffuse disc bulge with bilateral uncovertebral hypertrophy. Mild right greater than left facet degeneration. No spinal stenosis. Resultant mild right greater than left C4 foraminal stenosis. C4-C5: Diffuse disc bulge with bilateral uncovertebral hypertrophy. Broad posterior disc osteophyte flattens and partially effaces the ventral thecal sac without significant spinal stenosis or cord impingement. Superimposed mild facet degeneration. Resultant mild bilateral C5 foraminal stenosis. C5-C6: Mild diffuse disc bulge with uncovertebral hypertrophy. Flattening of the ventral thecal sac without significant spinal stenosis or cord deformity. Mild left C6 foraminal narrowing. No significant right foraminal stenosis. C6-C7: Mild diffuse disc bulge with bilateral uncovertebral hypertrophy. Flattening of the ventral thecal sac without significant spinal stenosis or cord deformity. Mild to moderate left with mild right C7 foraminal  stenosis. C7-T1: Negative interspace. Minimal bilateral facet hypertrophy. No significant stenosis. MRI THORACIC SPINE FINDINGS Alignment: Physiologic with preservation of the normal thoracic kyphosis. No listhesis. Vertebrae: Vertebral body height maintained without acute or chronic fracture. Bone marrow signal intensity within normal limits. Few scattered benign hemangiomata noted, largest of which measures 2 cm within the T11 vertebral body. No worrisome osseous lesions. No abnormal marrow edema. Cord: Caliber of the thoracic spinal cord is diffusely small, suggesting atrophy. Focal cord signal abnormality seen within the central/dorsal aspect of the cord at the level of T5 (series 20, image 16). No other definite cord signal abnormality identified. Postcontrast imaging of the thoracic spine was not performed. Paraspinal and other soft tissues: Paraspinous soft tissues within normal limits. Partially visualized lungs are grossly clear. Few small simple cyst noted within the  partially visualized kidneys. Visualized visceral structures otherwise unremarkable. Disc levels: T3-4: Diffuse disc bulge, eccentric to the right. Flattening and partial effacement of the ventral thecal sac without significant spinal stenosis. Minimal flattening of the ventral spinal cord, greater on the right. Foramina remain patent. No other significant disc pathology seen within the thoracic spine for patient age. No significant spinal stenosis. Foramina remain widely patent. No impingement. IMPRESSION: 1. Diffusely small caliber of the cervicothoracic spinal cord, suggesting atrophy, with a superimposed focal cord signal abnormality at the level of T5. Findings are nonspecific, with differential considerations including sequelae of demyelinating disease, prior myelitis (which could be either infectious or inflammatory in nature), prior spinal cord infarction, or prior trauma with myelopathy. Follow-up examination with postcontrast imaging of the thoracic spine recommended for complete evaluation as this was not performed on this exam. 2. Question additional punctate focus of cord signal abnormality versus artifact at the level of C3-4 as above. Attention at follow-up recommended. 3. Multilevel cervical spondylosis without significant spinal stenosis. Associated mild to moderate bilateral foraminal narrowing at C4 through C7 as above. 4. Degenerative disc bulging at T3-4 without significant stenosis. No other significant disc pathology within the thoracic spine. Electronically Signed   By: Rise Mu M.D.   On: 12/24/2019 02:40   MR CERVICAL SPINE W WO CONTRAST  Result Date: 12/24/2019 CLINICAL DATA:  Initial evaluation for urinary retention, peripheral numbness and tingling. EXAM: MRI CERVICAL AND THORACIC SPINE WITHOUT AND WITH CONTRAST TECHNIQUE: Multiplanar and multiecho pulse sequences of the cervical spine, to include the craniocervical junction and cervicothoracic junction, and the thoracic  spine, were obtained without and with intravenous contrast. Please note that postcontrast images of the thoracic spine were not performed. CONTRAST:  6mL GADAVIST GADOBUTROL 1 MMOL/ML IV SOLN COMPARISON:  Prior radiograph from 11/26/2019. FINDINGS: MRI CERVICAL SPINE FINDINGS Alignment: Straightening with smooth reversal of the normal cervical lordosis. No significant listhesis. Vertebrae: Vertebral body height maintained without acute or chronic fracture. Bone marrow signal intensity within normal limits. No discrete or worrisome osseous lesions. No abnormal marrow edema or enhancement. Cord: Overall, caliber of the cervical spinal cord is somewhat small, suggesting mild diffuse atrophy. Single small focus of possible cord signal abnormality within the central aspect of the cord at the level of C3-4, seen only on axial T2 weighted sequence (series 26, image 8). No other focal cord signal abnormality. No abnormal enhancement. Posterior Fossa, vertebral arteries, paraspinal tissues: Visualized brain and posterior fossa within normal limits. Craniocervical junction normal. Paraspinous and prevertebral soft tissues within normal limits. Normal flow voids seen within the vertebral arteries bilaterally. Disc levels: C2-C3: Negative interspace. Mild left greater than right facet hypertrophy. No  significant canal or foraminal stenosis. C3-C4: Diffuse disc bulge with bilateral uncovertebral hypertrophy. Mild right greater than left facet degeneration. No spinal stenosis. Resultant mild right greater than left C4 foraminal stenosis. C4-C5: Diffuse disc bulge with bilateral uncovertebral hypertrophy. Broad posterior disc osteophyte flattens and partially effaces the ventral thecal sac without significant spinal stenosis or cord impingement. Superimposed mild facet degeneration. Resultant mild bilateral C5 foraminal stenosis. C5-C6: Mild diffuse disc bulge with uncovertebral hypertrophy. Flattening of the ventral thecal sac  without significant spinal stenosis or cord deformity. Mild left C6 foraminal narrowing. No significant right foraminal stenosis. C6-C7: Mild diffuse disc bulge with bilateral uncovertebral hypertrophy. Flattening of the ventral thecal sac without significant spinal stenosis or cord deformity. Mild to moderate left with mild right C7 foraminal stenosis. C7-T1: Negative interspace. Minimal bilateral facet hypertrophy. No significant stenosis. MRI THORACIC SPINE FINDINGS Alignment: Physiologic with preservation of the normal thoracic kyphosis. No listhesis. Vertebrae: Vertebral body height maintained without acute or chronic fracture. Bone marrow signal intensity within normal limits. Few scattered benign hemangiomata noted, largest of which measures 2 cm within the T11 vertebral body. No worrisome osseous lesions. No abnormal marrow edema. Cord: Caliber of the thoracic spinal cord is diffusely small, suggesting atrophy. Focal cord signal abnormality seen within the central/dorsal aspect of the cord at the level of T5 (series 20, image 16). No other definite cord signal abnormality identified. Postcontrast imaging of the thoracic spine was not performed. Paraspinal and other soft tissues: Paraspinous soft tissues within normal limits. Partially visualized lungs are grossly clear. Few small simple cyst noted within the partially visualized kidneys. Visualized visceral structures otherwise unremarkable. Disc levels: T3-4: Diffuse disc bulge, eccentric to the right. Flattening and partial effacement of the ventral thecal sac without significant spinal stenosis. Minimal flattening of the ventral spinal cord, greater on the right. Foramina remain patent. No other significant disc pathology seen within the thoracic spine for patient age. No significant spinal stenosis. Foramina remain widely patent. No impingement. IMPRESSION: 1. Diffusely small caliber of the cervicothoracic spinal cord, suggesting atrophy, with a  superimposed focal cord signal abnormality at the level of T5. Findings are nonspecific, with differential considerations including sequelae of demyelinating disease, prior myelitis (which could be either infectious or inflammatory in nature), prior spinal cord infarction, or prior trauma with myelopathy. Follow-up examination with postcontrast imaging of the thoracic spine recommended for complete evaluation as this was not performed on this exam. 2. Question additional punctate focus of cord signal abnormality versus artifact at the level of C3-4 as above. Attention at follow-up recommended. 3. Multilevel cervical spondylosis without significant spinal stenosis. Associated mild to moderate bilateral foraminal narrowing at C4 through C7 as above. 4. Degenerative disc bulging at T3-4 without significant stenosis. No other significant disc pathology within the thoracic spine. Electronically Signed   By: Rise Mu M.D.   On: 12/24/2019 02:40   CT CORONARY MORPH W/CTA COR W/SCORE W/CA W/CM &/OR WO/CM  Addendum Date: 12/18/2019   ADDENDUM REPORT: 12/18/2019 16:31 ADDENDUM: OVER-READ INTERPRETATION  CT CHEST The following report is an over-read performed by radiologist Dr. Arliss Journey [facility] on 12/18/2019. This over-read does not include interpretation of cardiac or coronary anatomy or pathology. The coronary CTA interpretation by the cardiologist is attached. COMPARISON: 09/13/2019 chest radiograph. FINDINGS: Cardiovascular: Normal aortic caliber. No central pulmonary embolism, on this non-dedicated study. Mediastinum/Nodes: No imaged thoracic adenopathy. Lungs/Pleura: No pleural fluid.  Clear imaged lungs. Upper Abdomen: Probable flash fill hemangioma within the anterior aspect of  segment 4A at 8 mm on 58/22. Normal imaged portions of the spleen, stomach. Musculoskeletal: No acute osseous abnormality. IMPRESSION: No acute findings in the imaged extracardiac chest. Electronically Signed    By: Jeronimo GreavesKyle  Talbot M.D.   On: 12/18/2019 16:31   Result Date: 12/18/2019 CLINICAL DATA:  Chest pain EXAM: Cardiac/Coronary  CTA TECHNIQUE: The patient was scanned on a Siemens Somatoform go.Top scanner. FINDINGS: A retrospective scan was triggered in the descending thoracic aorta. Axial non-contrast 3 mm slices were carried out through the heart. The data set was analyzed on a dedicated work station and scored using the Agatson method. Gantry rotation speed was 330 msecs and collimation was .6 mm. 100mg  of metoprolol and 0.8 mg of sl NTG was given. The 3D data set was reconstructed in 5% intervals of the 50-95 % of the R-R cycle. Diastolic phases were analyzed on a dedicated work station using MPR, MIP and VRT modes. The patient received 75 cc of contrast. Aorta:  Normal size.  No calcifications.  No dissection. Aortic Valve:  Trileaflet.  No calcifications. Coronary Arteries:  Normal coronary origin.  Right dominance. RCA is a large dominant artery that gives rise to PDA and PLA. There is no plaque. Left main is a large artery that gives rise to LAD and LCX arteries. LAD is a large vessel that has no plaque. LCX is a non-dominant artery that gives rise to one obtuse marginal branch. There is no plaque. Other findings: Normal pulmonary vein drainage into the left atrium. Normal left atrial appendage without a thrombus. Normal size of the pulmonary artery. IMPRESSION: 1. Coronary calcium score of 0. Patient is low risk for near term coronary events 2. Normal coronary origin with right dominance. 3. No evidence of CAD. 4. CAD-RADS 0. Consider non-atherosclerotic causes of chest pain. Electronically Signed: By: Debbe OdeaBrian  Agbor-Etang M.D. On: 12/18/2019 15:51     Assessment/Plan 1. Chronic venous insufficiency I have had a long discussion with the patient regarding swelling and why it  causes symptoms.  Patient will begin wearing graduated compression stockings class 1 (20-30 mmHg) on a daily basis a prescription  was given. The patient will  beginning wearing the stockings first thing in the morning and removing them in the evening. The patient is instructed specifically not to sleep in the stockings.   In addition, behavioral modification will be initiated.  This will include frequent elevation, use of over the counter pain medications and exercise such as walking.  I have reviewed systemic causes for chronic edema such as liver, kidney and cardiac etiologies.  The patient denies problems with these organ systems.    Consideration for a lymph pump will also be made based upon the effectiveness of conservative therapy.  This would help to improve the edema control and prevent sequela such as ulcers and infections   Patient should undergo duplex ultrasound of the venous system to ensure that DVT or reflux is not present.  The patient will follow-up with me after the ultrasound.   - VAS US LOWER EXTREMITY VENOUS REFLUX; Future  2. Lymphedema I have had a long discussion with the patient regarding swelling and why it  causes symptoms.  Patient will begin wearing graduated compression stockings class 1 (20-30 mmHg) on a daily basis a prescription was given. The patient will  beginning wearing the stockings first thing in the morning and removing them in the evening. The patient is instructed specifically not to sleep in the stockings.   In addition, behavioral  modification will be initiated.  This will include frequent elevation, use of over the counter pain medications and exercise such as walking.  I have reviewed systemic causes for chronic edema such as liver, kidney and cardiac etiologies.  The patient denies problems with these organ systems.    Consideration for a lymph pump will also be made based upon the effectiveness of conservative therapy.  This would help to improve the edema control and prevent sequela such as ulcers and infections   Patient should undergo duplex ultrasound of the venous  system to ensure that DVT or reflux is not present.  The patient will follow-up with me after the ultrasound.    3. Hyperlipidemia, unspecified hyperlipidemia type Continue statin as ordered and reviewed, no changes at this time   4. Chronic bilateral low back pain without sciatica This may be a significant contributing factor to her leg pain.  Continue NSAID medications as already ordered, these medications have been reviewed and there are no changes at this time.  Continued activity and therapy was stressed.     Levora Dredge, MD  12/24/2019 6:38 PM

## 2019-12-25 ENCOUNTER — Other Ambulatory Visit: Payer: Self-pay

## 2019-12-25 ENCOUNTER — Encounter (INDEPENDENT_AMBULATORY_CARE_PROVIDER_SITE_OTHER): Payer: BC Managed Care – PPO | Admitting: Vascular Surgery

## 2019-12-25 ENCOUNTER — Ambulatory Visit (INDEPENDENT_AMBULATORY_CARE_PROVIDER_SITE_OTHER): Payer: BC Managed Care – PPO | Admitting: Vascular Surgery

## 2019-12-25 ENCOUNTER — Encounter (INDEPENDENT_AMBULATORY_CARE_PROVIDER_SITE_OTHER): Payer: Self-pay | Admitting: Vascular Surgery

## 2019-12-25 VITALS — BP 112/74 | HR 71 | Ht 70.0 in | Wt 149.0 lb

## 2019-12-25 DIAGNOSIS — M545 Low back pain: Secondary | ICD-10-CM

## 2019-12-25 DIAGNOSIS — I872 Venous insufficiency (chronic) (peripheral): Secondary | ICD-10-CM

## 2019-12-25 DIAGNOSIS — E785 Hyperlipidemia, unspecified: Secondary | ICD-10-CM | POA: Diagnosis not present

## 2019-12-25 DIAGNOSIS — R102 Pelvic and perineal pain unspecified side: Secondary | ICD-10-CM | POA: Insufficient documentation

## 2019-12-25 DIAGNOSIS — I890 Lymphedema, not elsewhere classified: Secondary | ICD-10-CM

## 2019-12-25 DIAGNOSIS — G8929 Other chronic pain: Secondary | ICD-10-CM

## 2019-12-29 ENCOUNTER — Encounter (INDEPENDENT_AMBULATORY_CARE_PROVIDER_SITE_OTHER): Payer: BC Managed Care – PPO | Admitting: Vascular Surgery

## 2019-12-30 ENCOUNTER — Encounter: Payer: BC Managed Care – PPO | Admitting: Obstetrics and Gynecology

## 2020-01-01 ENCOUNTER — Other Ambulatory Visit: Payer: Self-pay | Admitting: Neurology

## 2020-01-01 DIAGNOSIS — G373 Acute transverse myelitis in demyelinating disease of central nervous system: Secondary | ICD-10-CM

## 2020-01-01 DIAGNOSIS — G959 Disease of spinal cord, unspecified: Secondary | ICD-10-CM

## 2020-01-05 ENCOUNTER — Encounter (INDEPENDENT_AMBULATORY_CARE_PROVIDER_SITE_OTHER): Payer: BC Managed Care – PPO | Admitting: Vascular Surgery

## 2020-01-12 ENCOUNTER — Encounter (INDEPENDENT_AMBULATORY_CARE_PROVIDER_SITE_OTHER): Payer: BC Managed Care – PPO | Admitting: Vascular Surgery

## 2020-01-14 ENCOUNTER — Ambulatory Visit: Payer: BC Managed Care – PPO | Admitting: Internal Medicine

## 2020-01-15 ENCOUNTER — Ambulatory Visit: Payer: BC Managed Care – PPO | Admitting: Urology

## 2020-01-19 ENCOUNTER — Ambulatory Visit: Payer: BC Managed Care – PPO | Admitting: Internal Medicine

## 2020-01-20 ENCOUNTER — Ambulatory Visit: Payer: BC Managed Care – PPO

## 2020-01-21 ENCOUNTER — Ambulatory Visit: Payer: BC Managed Care – PPO

## 2020-01-22 ENCOUNTER — Ambulatory Visit
Admission: RE | Admit: 2020-01-22 | Discharge: 2020-01-22 | Disposition: A | Discharge status: Home / Self Care | Payer: BC Managed Care – PPO | Source: Ambulatory Visit | Attending: Neurology | Admitting: Neurology

## 2020-01-22 ENCOUNTER — Ambulatory Visit (INDEPENDENT_AMBULATORY_CARE_PROVIDER_SITE_OTHER): Payer: BC Managed Care – PPO | Admitting: Vascular Surgery

## 2020-01-22 ENCOUNTER — Encounter (INDEPENDENT_AMBULATORY_CARE_PROVIDER_SITE_OTHER): Payer: BC Managed Care – PPO

## 2020-01-22 ENCOUNTER — Other Ambulatory Visit: Payer: Self-pay

## 2020-01-22 ENCOUNTER — Ambulatory Visit: Payer: BC Managed Care – PPO

## 2020-01-22 DIAGNOSIS — G959 Disease of spinal cord, unspecified: Secondary | ICD-10-CM

## 2020-01-22 DIAGNOSIS — G373 Acute transverse myelitis in demyelinating disease of central nervous system: Secondary | ICD-10-CM | POA: Insufficient documentation

## 2020-01-22 MED ORDER — GADOBUTROL 1 MMOL/ML IV SOLN
6.0000 mL | Freq: Once | INTRAVENOUS | Status: AC | PRN
  Administered 2020-01-22: 6 mL via INTRAVENOUS

## 2020-01-23 ENCOUNTER — Encounter: Payer: BC Managed Care – PPO | Admitting: Obstetrics and Gynecology

## 2020-01-24 ENCOUNTER — Inpatient Hospital Stay: Admission: RE | Admit: 2020-01-24 | Payer: BC Managed Care – PPO | Source: Ambulatory Visit

## 2020-02-13 ENCOUNTER — Encounter: Payer: BC Managed Care – PPO | Admitting: Obstetrics and Gynecology

## 2020-02-19 ENCOUNTER — Ambulatory Visit: Payer: BC Managed Care – PPO | Admitting: Internal Medicine

## 2020-02-24 ENCOUNTER — Other Ambulatory Visit (INDEPENDENT_AMBULATORY_CARE_PROVIDER_SITE_OTHER): Payer: Self-pay | Admitting: Nurse Practitioner

## 2020-02-24 DIAGNOSIS — I872 Venous insufficiency (chronic) (peripheral): Secondary | ICD-10-CM

## 2020-02-24 DIAGNOSIS — I890 Lymphedema, not elsewhere classified: Secondary | ICD-10-CM

## 2020-02-26 ENCOUNTER — Ambulatory Visit (INDEPENDENT_AMBULATORY_CARE_PROVIDER_SITE_OTHER): Payer: BC Managed Care – PPO

## 2020-02-26 ENCOUNTER — Ambulatory Visit (INDEPENDENT_AMBULATORY_CARE_PROVIDER_SITE_OTHER): Payer: BC Managed Care – PPO | Admitting: Vascular Surgery

## 2020-02-26 ENCOUNTER — Other Ambulatory Visit: Payer: Self-pay

## 2020-02-26 ENCOUNTER — Encounter (INDEPENDENT_AMBULATORY_CARE_PROVIDER_SITE_OTHER): Payer: Self-pay | Admitting: Vascular Surgery

## 2020-02-26 VITALS — BP 124/71 | HR 62 | Ht 70.0 in | Wt 146.0 lb

## 2020-02-26 DIAGNOSIS — M1991 Primary osteoarthritis, unspecified site: Secondary | ICD-10-CM

## 2020-02-26 DIAGNOSIS — I872 Venous insufficiency (chronic) (peripheral): Secondary | ICD-10-CM | POA: Diagnosis not present

## 2020-02-26 DIAGNOSIS — I890 Lymphedema, not elsewhere classified: Secondary | ICD-10-CM

## 2020-02-26 DIAGNOSIS — E785 Hyperlipidemia, unspecified: Secondary | ICD-10-CM | POA: Diagnosis not present

## 2020-02-26 NOTE — Progress Notes (Signed)
MRN : [id number]  Cristina Wade is a 49 y.o. (December 21, 1970) female who presents with chief complaint of No chief complaint on file. Marland Kitchen  History of Present Illness:   The patient returns to the office for followup evaluation regarding leg swelling.  The swelling has improved quite a bit and the pain associated with swelling has decreased substantially. There have not been any interval development of a ulcerations or wounds.  Since the previous visit the patient has been wearing graduated compression stockings and has noted little significant improvement in the lymphedema. The patient has been using compression routinely morning until night.  The patient also states elevation during the day and exercise is being done too.  Venous ultrasound performed today demonstrates patency of the deep and superficial system.  No reflux is identified within the superficial system.  There is no evidence of prior DVT   No outpatient medications have been marked as taking for the 02/26/20 encounter (Appointment) with Gilda Crease, Latina Craver, MD.    Past Medical History:  Diagnosis Date  . Asthma   . Chronic pelvic pain in female 2018  . Cystitis   . Cystocele   . Endometrial polyp   . Endometriosis    per pt report but never seen during surg  . Frequent headaches   . Gastritis   . GERD (gastroesophageal reflux disease)    rare  . Gross hematuria   . History of kidney stones   . Microscopic hematuria   . Migraine   . Migraines   . Neuropathy   . Osteopenia   . Urinary disorder   . UTI (lower urinary tract infection)     Past Surgical History:  Procedure Laterality Date  . COLONOSCOPY N/A 11/05/2014   Procedure: COLONOSCOPY;  Surgeon: Scot Jun, MD;  Location: [facility] ENDOSCOPY;  Service: Endoscopy;  Laterality: N/A;  . COLONOSCOPY  11/2016   Dr. Mechele Collin  . CYSTOSCOPY  2007   with biopsy   . CYSTOSCOPY N/A 06/01/2016   Procedure: CYSTOSCOPY;  Surgeon: Vena Austria, MD;  Location: [facility]  ORS;  Service: Gynecology;  Laterality: N/A;  . DIAGNOSTIC LAPAROSCOPY    . DILATATION & CURETTAGE/HYSTEROSCOPY WITH MYOSURE N/A 04/13/2015   Procedure: DILATATION & CURETTAGE/HYSTEROSCOPY WITH MYOSURE/POLYPECTOMY;  Surgeon: Elenora Fender Ward, MD;  Location: [facility] ORS;  Service: Gynecology;  Laterality: N/A;  . DILATATION & CURETTAGE/HYSTEROSCOPY WITH MYOSURE N/A 06/01/2016   Procedure: DILATATION & CURETTAGE/HYSTEROSCOPY WITH MYOSURE;  Surgeon: Vena Austria, MD;  Location: [facility] ORS;  Service: Gynecology;  Laterality: N/A;  . GUM SURGERY    . laproscopy  2007  . POLYPECTOMY     endometrial    Social History Social History   Tobacco Use  . Smoking status: Never Smoker  . Smokeless tobacco: Never Used  Vaping Use  . Vaping Use: Never used  Substance Use Topics  . Alcohol use: No  . Drug use: No    Family History Family History  Problem Relation Age of Onset  . Colon cancer Father 73  . Arthritis Father   . Hyperlipidemia Father   . Transient ischemic attack Father   . Cancer - Colon Father 60  . Prostate cancer Father 29  . Valvular heart disease Father   . Arthritis Mother   . Heart disease Mother   . Stroke Mother        TIA  . Hypertension Mother   . Diabetes Mother   . Kidney cancer Mother 25  . Heart failure Mother   .  Hyperlipidemia Mother   . Cancer Maternal Grandmother   . Cancer Paternal Grandfather        prostate  . Breast cancer Cousin 35       has contact    Allergies  Allergen Reactions  . Lac Bovis Other (See Comments)  . Milk-Related Compounds   . Omeprazole Other (See Comments)    Pt reports "Chest pain, HA and Dysuria."  . Penicillins Itching and Rash     REVIEW OF SYSTEMS (Negative unless checked)  Constitutional: [] Weight loss  [] Fever  [] Chills Cardiac: [] Chest pain   [] Chest pressure   [] Palpitations   [] Shortness of breath when laying flat   [] Shortness of breath with exertion. Vascular:  [] Pain in legs with walking   [x] Pain in  legs at rest  [] History of DVT   [] Phlebitis   [] Swelling in legs   [] Varicose veins   [] Non-healing ulcers Pulmonary:   [] Uses home oxygen   [] Productive cough   [] Hemoptysis   [] Wheeze  [] COPD   [] Asthma Neurologic:  [] Dizziness   [] Seizures   [] History of stroke   [] History of TIA  [] Aphasia   [] Vissual changes   [] Weakness or numbness in arm   [] Weakness or numbness in leg Musculoskeletal:   [] Joint swelling   [] Joint pain   [x] Low back pain Hematologic:  [] Easy bruising  [] Easy bleeding   [] Hypercoagulable state   [] Anemic Gastrointestinal:  [] Diarrhea   [] Vomiting  [] Gastroesophageal reflux/heartburn   [] Difficulty swallowing. Genitourinary:  [] Chronic kidney disease   [] Difficult urination  [] Frequent urination   [] Blood in urine Skin:  [] Rashes   [] Ulcers  Psychological:  [] History of anxiety   []  History of major depression.  Physical Examination  There were no vitals filed for this visit. There is no height or weight on file to calculate BMI. Gen: WD/WN, NAD Head: /AT, No temporalis wasting.  Ear/Nose/Throat: Hearing grossly intact, nares w/o erythema or drainage Eyes: PER, EOMI, sclera nonicteric.  Neck: Supple, no large masses.   Pulmonary:  Good air movement, no audible wheezing bilaterally, no use of accessory muscles.  Cardiac: RRR, no JVD Vascular: scattered varicosities present bilaterally.  Mild venous stasis changes to the legs bilaterally.  2+ soft pitting edema Vessel Right Left  Radial Palpable Palpable  Gastrointestinal: Non-distended. No guarding/no peritoneal signs.  Musculoskeletal: M/S 5/5 throughout.  No deformity or atrophy.  Neurologic: CN 2-12 intact. Symmetrical.  Speech is fluent. Motor exam as listed above. Psychiatric: Judgment intact, Mood & affect appropriate for pt's clinical situation. Dermatologic: No rashes or ulcers noted.  No changes consistent with cellulitis.  CBC Lab Results  Component Value Date   WBC 6.6 10/26/2019   HGB 14.0  10/26/2019   HCT 41.7 10/26/2019   MCV 89.9 10/26/2019   PLT 232 10/26/2019    BMET    Component Value Date/Time   NA 138 12/11/2019 1524   K 4.8 12/11/2019 1524   CL 101 12/11/2019 1524   CO2 27 12/11/2019 1524   GLUCOSE 89 12/11/2019 1524   BUN 12 12/11/2019 1524   CREATININE 0.64 12/11/2019 1524   CALCIUM 9.4 12/11/2019 1524   GFRNONAA >60 12/11/2019 1524   GFRAA >60 12/11/2019 1524   CrCl cannot be calculated (Patient's most recent lab result is older than the maximum 21 days allowed.).  COAG No results found for: INR, PROTIME  Radiology No results found.   Assessment/Plan 1. Chronic venous insufficiency No surgery or intervention at this point in time.  I have reviewed my  discussion with the patient regarding venous insufficiency and why it causes symptoms. I have discussed with the patient the chronic skin changes that accompany venous insufficiency and the long term sequela such as ulceration. Patient will contnue wearing graduated compression stockings on a daily basis, as this has provided excellent control of his edema. The patient will put the stockings on first thing in the morning and removing them in the evening. The patient is reminded not to sleep in the stockings.  In addition, behavioral modification including elevation during the day will be initiated. Exercise is strongly encouraged.  Duplex ultrasound of the bilateral lower extremity shows patent deep system no evidence of superficial reflux  Given the patient's good control and lack of any problems regarding the venous insufficiency and lymphedema a lymph pump in not need at this time.  The patient will follow up with me PRN should anything change.  The patient voices agreement with this plan.   2. Lymphedema No surgery or intervention at this point in time.  I have reviewed my discussion with the patient regarding venous insufficiency and why it causes symptoms. I have discussed with the patient  the chronic skin changes that accompany venous insufficiency and the long term sequela such as ulceration. Patient will contnue wearing graduated compression stockings on a daily basis, as this has provided excellent control of his edema. The patient will put the stockings on first thing in the morning and removing them in the evening. The patient is reminded not to sleep in the stockings.  In addition, behavioral modification including elevation during the day will be initiated. Exercise is strongly encouraged.  Duplex ultrasound of the bilateral lower extremity shows patent deep system no evidence of superficial reflux  Given the patient's good control and lack of any problems regarding the venous insufficiency and lymphedema a lymph pump in not need at this time.  The patient will follow up with me PRN should anything change.  The patient voices agreement with this plan.   3. Localized, primary osteoarthritis Continue NSAID medications as already ordered, these medications have been reviewed and there are no changes at this time.  Continued activity and therapy was stressed.   4. Hyperlipidemia, unspecified hyperlipidemia type Continue statin as ordered and reviewed, no changes at this time     Levora Dredge, MD  02/26/2020 12:56 PM

## 2020-02-27 ENCOUNTER — Encounter (INDEPENDENT_AMBULATORY_CARE_PROVIDER_SITE_OTHER): Payer: Self-pay | Admitting: Vascular Surgery

## 2020-03-04 ENCOUNTER — Other Ambulatory Visit: Payer: Self-pay

## 2020-03-04 ENCOUNTER — Ambulatory Visit: Payer: BC Managed Care – PPO | Attending: Obstetrics & Gynecology

## 2020-03-04 DIAGNOSIS — M533 Sacrococcygeal disorders, not elsewhere classified: Secondary | ICD-10-CM

## 2020-03-04 DIAGNOSIS — R293 Abnormal posture: Secondary | ICD-10-CM | POA: Diagnosis not present

## 2020-03-04 NOTE — Patient Instructions (Signed)
°  Hold for ~ 5 deep breaths (~ 30 sec) and repeat 2-3 times on each side.   Stabilization: Diaphragmatic Breathing    Lie with knees bent, feet flat. Place one hand on stomach, other on chest. Breathe deeply through nose, lifting belly hand without any motion of hand on chest. Do this for ~ 5 min. Per night and throughout the day if you notice yourself feeling tense.

## 2020-03-04 NOTE — Therapy (Signed)
[facility] MAIN [facility] SERVICES [address] [place], [place], [address] Phone: [phone]   Fax:  [phone]  Physical Therapy Evaluation  The patient has been informed of current processes in place at [facility]patient [facility]hab to protect patients from Covid-19 exposure including social distancing, schedule modifications, and new cleaning procedures. After discussing their particular risk with a therapist based on the patient's personal risk factors, the patient has decided to proceed with in-person therapy.  Patient Details  Name: Cristina Wade MRN: [id number] Date of Birth: 05/16/1970 No data recorded  Encounter Date: 03/04/2020   PT End of Session - 03/05/20 0809    Visit Number 1    Number of Visits 10    Date for PT Re-Evaluation 05/14/20    Authorization Type [employer]BS    Authorization Time Period 03/04/20 through 05/14/20    Authorization - Visit Number 1    Authorization - Number of Visits 10    Progress Note Due on Visit 10    PT Start Time 1515    PT Stop Time 1615    PT Time Calculation (min) 60 min    Activity Tolerance Patient tolerated treatment well;No increased pain    Behavior During Therapy WFL for tasks assessed/performed           Past Medical History:  Diagnosis Date  . Asthma   . Chronic pelvic pain in female 2018  . Cystitis   . Cystocele   . Endometrial polyp   . Endometriosis    per pt report but never seen during surg  . Frequent headaches   . Gastritis   . GERD (gastroesophageal reflux disease)    rare  . Gross hematuria   . History of kidney stones   . Microscopic hematuria   . Migraine   . Migraines   . Neuropathy   . Osteopenia   . Urinary disorder   . UTI (lower urinary tract infection)     Past Surgical History:  Procedure Laterality Date  . COLONOSCOPY N/A 11/05/2014   Procedure: COLONOSCOPY;  Surgeon: Scot Jun, MD;  Location: [facility] ENDOSCOPY;  Service: Endoscopy;  Laterality: N/A;  .  COLONOSCOPY  11/2016   Dr. Mechele Collin  . CYSTOSCOPY  2007   with biopsy   . CYSTOSCOPY N/A 06/01/2016   Procedure: CYSTOSCOPY;  Surgeon: Vena Austria, MD;  Location: [facility] ORS;  Service: Gynecology;  Laterality: N/A;  . DIAGNOSTIC LAPAROSCOPY    . DILATATION & CURETTAGE/HYSTEROSCOPY WITH MYOSURE N/A 04/13/2015   Procedure: DILATATION & CURETTAGE/HYSTEROSCOPY WITH MYOSURE/POLYPECTOMY;  Surgeon: Elenora Fender Ward, MD;  Location: [facility] ORS;  Service: Gynecology;  Laterality: N/A;  . DILATATION & CURETTAGE/HYSTEROSCOPY WITH MYOSURE N/A 06/01/2016   Procedure: DILATATION & CURETTAGE/HYSTEROSCOPY WITH MYOSURE;  Surgeon: Vena Austria, MD;  Location: [facility] ORS;  Service: Gynecology;  Laterality: N/A;  . GUM SURGERY    . laproscopy  2007  . POLYPECTOMY     endometrial    There were no vitals filed for this visit.    Pelvic Floor Physical Therapy Evaluation and Assessment  SUBJECTIVE  Patient reports: Started having pelvic pain in ~ 2017. Has had pelvic PT before. Saw shin-yiing in 2017 and was given a heel-lift but it hurt her foot to wear it so she stopped. She say Miguel in 2018 for LBP and it helped a lot. She was doing pretty well until recently but then she kept getting infections, started having more retention again around September this year she started having to  catheterize 2 times a day again. She has intermittent pelvic pain that is not correlated with any specific motion but does seem to be better than it was when she was not having regular BM's. Has days with no pain at all. She had imaging done which showed she may have transverse myelitis. She was having neuropathy in all 4 extremities which has improved with medication.  Precautions:  H/o migraines, neuropathy in BUE and BLE as well as LBP, cystocele, asthma, chronic pelvic pain, GERD, cystitis, neurogenic bladder, frequent UTI's  Social/Family/Vocational History:   Full time food service assistant. Lives with twin sister  Work-  [employer]   No pets   No children   Right handed   No caffeine daily- tea occasionally; eats chocolate   Enjoys shopping, resting, [place]     Recent Procedures/Tests/Findings:  "Diffusely small caliber of the cervicothoracic spinal cord, suggesting atrophy, with a superimposed focal cord signal abnormality at the level of T5. Findings are nonspecific, with differential considerations including sequelae of demyelinating disease, prior myelitis (which could be either infectious or inflammatory in nature), prior spinal cord infarction, or prior trauma with myelopathy. Follow-up examination with postcontrast imaging of the thoracic spine recommended for complete evaluation as this was not performed on this exam. 2. Question additional punctate focus of cord signal abnormality versus artifact at the level of C3-4 as above. Attention at follow-up recommended. 3. Multilevel cervical spondylosis without significant spinal stenosis. Associated mild to moderate bilateral foraminal narrowing at C4 through C7 as above. 4. Degenerative disc bulging at T3-4 without significant stenosis. No other significant disc pathology within the thoracic spine." Thoracic MRI, 12/23/19  Obstetrical History: No deliveries.  Gynecological History: none  Urinary History: Urinating ~ every 3-4 hours and catheterizing 2 times per day. Feels like she is not getting a lt out when she does   Gastrointestinal History: Having 1 BM per day, not straining type 4-5 stool, taking 2 rounded tsp. metamucil and 1 cap mirilax  Sexual activity/pain: N/A  Location of pain: lower abdomen B Current pain:  0/10  Max pain:  6/10 Least pain:  0/10 Nature of pain: achy dull  Patient Goals: To be able to urinate without having to catheterize.    OBJECTIVE  Posture/Observations:  Sitting:  Standing: 5'10" R iliac crest high, R ASIS high, mild anterior pelvic tilt,, R PSIS  high, ROT and SB WNL but "feels stiff" rotating L.  Supine: R ASIS low.  Prone: C2 slightly deviated R, C3-5 anteriorly shifted (spondylsis) erythema at sub-occipital region.  Palpation/Segmental Motion/Joint Play: Very little spinal mobility with pain upon palpation through cervical and lumbar extensors.   Special tests:   Supine-to-long-sit: RLE long in supine and sitting Leg-length: RLE 90cm, LLE: 88.5  Range of Motion/Flexibilty:  Spine: WNL for ROT. SB ~ 1.5 fingers from knee B. Forward bend ~ 3 in. From floor. Hips:    Strength/MMT: Deferred to follow-up LE MMT  LE MMT Left Right  Hip flex:  (L2) /5 /5  Hip ext: /5 /5  Hip abd: /5 /5  Hip add: /5 /5  Hip IR /5 /5  Hip ER /5 /5     Abdominal:  Palpation: TTP to B psoas abdominally only Diastasis: none  Pelvic Floor External Exam: Deferred to follow-up Introitus Appears:  Skin integrity:  Palpation: Cough: Prolapse visible?: Scar mobility:  Internal Vaginal Exam: Strength (PERF):  Symmetry: Palpation: Prolapse:   Internal Rectal Exam: Strength (PERF): Symmetry: Palpation:  Prolapse:   Gait Analysis: Deferred to follow-up   Pelvic Floor Outcome Measures: FOTO Bowel Cnst: 81, Urinary Problem: 62  INTERVENTIONS THIS SESSION: Self-care: Educated on the structure and function of the pelvic floor in relation to their symptoms as well as the POC, and initial HEP in order to set patient expectations and understanding from which we will build on in the future sessions.  Total time: 60 min.                   Objective measurements completed on examination: See above findings.                 PT Short Term Goals - 03/05/20 1653      PT SHORT TERM GOAL #1   Title Patient will demonstrate improved pelvic alignment and balance of musculature surrounding the pelvis to facilitate decreased PFM spasms and decrease pelvic pain.    Baseline RLE long, spasms through R>L Psoas     Time 5    Period Weeks    Status New    Target Date 04/09/20      PT SHORT TERM GOAL #2   Title Patient will demonstrate improved sitting and standing posture to demonstrate learning and decrease stress on the pelvic floor with functional activity.    Baseline hyperkyphosis and FHP    Time 5    Period Weeks    Status New    Target Date 04/09/20      PT SHORT TERM GOAL #3   Title Patient will demonstrate HEP x1 in the clinic to demonstrate understanding and proper form to allow for further improvement.    Baseline Pt. lacks knowledge of whaich therapeutic exercises will decrease her pain/Sx.    Time 5    Period Weeks    Status New    Target Date 04/09/20             PT Long Term Goals - 03/05/20 1657      PT LONG TERM GOAL #1   Title Pt. will be able to empty her bladder 6-8 times throughout the day with I's and O's accounting for her total volume and no need to catheterize to demonstrate improved PFM and bladder function, improved QOL, and decreased risk of UTI.    Baseline Pt. having to catheterize 2 times per day and having only small amounts of urine voluntarily.    Time 10    Period Weeks    Status New    Target Date 05/14/20      PT LONG TERM GOAL #2   Title Patient will report having BM's at least every-other day with consistency between [facility] stool scale 3-5 over the prior week without need for metamucil and mirilax to demonstrate decreased constipation and improved GI and PFM function.    Baseline able to stay regular only with use of both mirilaz and metamucil daily.    Time 10    Period Weeks    Status New    Target Date 05/14/20      PT LONG TERM GOAL #3   Title Pt. will demonstrate an improved FOTO score by 10 points in each category to demonstrat improved function.    Baseline FOTO Bowel Cnst: 81, Urinary Problem: 62    Time 10    Period Weeks    Status New    Target Date 05/14/20                  Plan -  03/05/20 0810    Clinical  Impression Statement Pt. is a 49 y/o female who presents today with cheif c/o urinary retention as well as pelvic pain and constipation. Her PMH is significant for H/o migraines, neuropathy in BUE and BLE as well as LBP, cystocele, asthma, chronic pelvic pain, GERD, cystitis, neurogenic bladder, frequent UTI's. Her clinical assessment revealed a LLD with the RLE long as well as hypomobility through the thoracic and sacral spine with hypermobility/anterior shift through the cervical spine and increased force transferrence through the T/L junction and lumbar spine. She will benefit from skilled pelvic floor PT to address the noted deficits and to continue to assess for and address any othe potential causes of Sx.    Personal Factors and Comorbidities Comorbidity 3+    Comorbidities H/o migraines, neuropathy in BUE and BLE as well as LBP, cystocele, asthma, chronic pelvic pain, GERD, cystitis, neurogenic bladder, frequent UTI's    Examination-Activity Limitations Continence;Toileting    Stability/Clinical Decision Making Evolving/Moderate complexity    Clinical Decision Making Moderate    Rehab Potential Good    PT Frequency 1x / week    PT Duration Other (comment)   10 weeks   PT Treatment/Interventions Therapeutic exercise;Therapeutic activities;Manual techniques;Patient/family education;Dry needling;Neuromuscular re-education;Ultrasound;Electrical Stimulation;Iontophoresis 4mg /ml Dexamethasone;Aquatic Therapy;Traction;ADLs/Self Care Home Management;Biofeedback;Gait training;Moist Heat;Functional mobility training;Orthotic Fit/Training;Scar mobilization;Passive range of motion;Taping;Spinal Manipulations;Joint Manipulations    PT Next Visit Plan mobilize sacrum, release Psoas, give heel-lift, assess foot/calf and release PRN    PT Home Exercise Plan Breathing, calf stretch    Consulted and Agree with Plan of Care Patient           Patient will benefit from skilled therapeutic intervention in order  to improve the following deficits and impairments:  Decreased strength, Hypomobility, Increased fascial restricitons, Impaired sensation, Pain, Improper body mechanics, Postural dysfunction, Hypermobility, Decreased coordination  Visit Diagnosis: Abnormal posture  Sacrococcygeal disorders, not elsewhere classified     Problem List Patient Active Problem List   Diagnosis Date Noted  . Pelvic pain in female 12/25/2019  . Chronic low back pain 12/25/2019  . Chronic venous insufficiency 12/24/2019  . Lymphedema 12/24/2019  . Lower extremity edema 12/03/2019  . HLD (hyperlipidemia) 10/10/2019  . Low back pain radiating to left leg 10/04/2019  . Dyspnea on exertion 09/20/2019  . Orthopnea 09/20/2019  . Chronic abdominal pain 05/14/2019  . Irritable bowel syndrome with constipation 05/14/2019  . Chronic constipation 12/25/2018  . Urinary retention 08/12/2018  . Anxiety 08/12/2018  . Elevated blood pressure reading without diagnosis of hypertension 07/12/2018  . Left leg pain 07/12/2018  . Acute left-sided low back pain 06/03/2018  . Cloudy urine 04/29/2018  . Chest pain 04/29/2018  . Frontal headache 04/27/2018  . Localized, primary osteoarthritis 02/07/2018  . Pelvic pain 01/30/2018  . Slow transit constipation 10/10/2017  . Bilateral impacted cerumen 11/28/2016  . Chronic bilateral low back pain without sciatica 11/28/2016  . Precordial pain 07/07/2016  . Right bundle branch block (RBBB) on electrocardiogram (ECG) 07/07/2016  . Chest tightness 06/27/2016  . Preventative health care 09/30/2015  . Encounter to establish care 09/28/2014  . Vestibular migraine 09/28/2014  . Encounter for screening colonoscopy 11/21/2013   01/21/2014 DPT, ATC Cleophus Molt 03/05/2020, 5:05 PM  [place] [facility] MAIN [facility] SERVICES [address] [place], [place], [place] Phone: [phone]   Fax:  [phone]  Name: Cristina Wade MRN: [name] Date  of Birth: 10-Dec-1970

## 2020-03-08 ENCOUNTER — Ambulatory Visit: Payer: BC Managed Care – PPO

## 2020-03-18 ENCOUNTER — Ambulatory Visit: Payer: BC Managed Care – PPO

## 2020-03-22 ENCOUNTER — Ambulatory Visit: Payer: BC Managed Care – PPO | Admitting: Internal Medicine

## 2020-03-22 ENCOUNTER — Ambulatory Visit: Payer: BC Managed Care – PPO

## 2020-04-12 ENCOUNTER — Ambulatory Visit: Payer: BC Managed Care – PPO | Admitting: Family

## 2020-04-15 ENCOUNTER — Encounter: Payer: Self-pay | Admitting: Internal Medicine

## 2020-04-15 ENCOUNTER — Other Ambulatory Visit: Payer: Self-pay

## 2020-04-15 ENCOUNTER — Ambulatory Visit (INDEPENDENT_AMBULATORY_CARE_PROVIDER_SITE_OTHER): Payer: BC Managed Care – PPO | Admitting: Internal Medicine

## 2020-04-15 VITALS — BP 112/68 | HR 63 | Ht 70.0 in | Wt 142.0 lb

## 2020-04-15 DIAGNOSIS — R079 Chest pain, unspecified: Secondary | ICD-10-CM | POA: Diagnosis not present

## 2020-04-15 MED ORDER — FUROSEMIDE 20 MG PO TABS
20.0000 mg | ORAL_TABLET | Freq: Every day | ORAL | Status: DC | PRN
Start: 2020-04-15 — End: 2020-07-12

## 2020-04-15 NOTE — Patient Instructions (Signed)
Medication Instructions:   Your physician has recommended you make the following change in your medication:   CHANGE Lasix 20mg  to taking daily AS NEEDED for swelling or weight gain of 3 lbs overnight or 5 lbs in one week  *If you need a refill on your cardiac medications before your next appointment, please call your pharmacy*   Lab Work: None ordered    Testing/Procedures: None ordered   Follow-Up: At , you and your health needs are our priority.  As part of our continuing mission to provide you with exceptional heart care, we have created designated Provider Care Teams.  These Care Teams include your primary Cardiologist (physician) and Advanced Practice Providers (APPs -  Physician Assistants and Nurse Practitioners) who all work together to provide you with the care you need, when you need it.  We recommend signing up for the patient portal called "MyChart".  Sign up information is provided on this After Visit Summary.  MyChart is used to connect with patients for Virtual Visits (Telemedicine).  Patients are able to view lab/test results, encounter notes, upcoming appointments, etc.  Non-urgent messages can be sent to your provider as well.   To learn more about what you can do with MyChart, go to [employer].    Your next appointment:    Follow up AS NEEDED  The format for your next appointment:   In Person  Provider:   You may see Dr. [email or web address] or one of the following Advanced Practice Providers on your designated Care Team:    Okey Dupre, NP  Nicolasa Ducking, PA-C  Eula Listen, PA-C  Cadence [place], [place]  [place], NP

## 2020-04-15 NOTE — Progress Notes (Signed)
Follow-up Outpatient Visit Date: 04/15/2020  Primary Care Provider: [name], FNP [address] [place] [address]  Chief Complaint: Follow-up chest pain  HPI:  Cristina Wade is a 49 y.o. female with history of asthma, GERD, and migraine headaches, who presents for follow-up of pain.  I last saw her in August, at which time she reported increasing chest pain over the preceding week.  Though preceding exercise tolerance test was low risk, we agreed to proceed with cardiac CTA in the setting of worsening chest pain.  The CTA was normal without evidence of CAD or other incidental findings.  Today, Cristina Wade reports that she is feeling relatively well, recovering from COVID-19 infection earlier this month.  Her symptoms consisted primarily of headaches, myalgias, coordination issues, and cough.  Other than a mild cough, she is back to baseline.  She notes that her father passed away COVID-19 earlier this month.  Cristina Wade has not had any chest pain, shortness of breath, palpitations, lightheadedness, or edema.  She wonders how long she will need to stay on furosemide.  --------------------------------------------------------------------------------------------------  Cardiovascular History & Procedures: Cardiovascular Problems:  Chest pain  Risk Factors:  None  Cath/PCI:  None  CV Surgery:  None  EP Procedures and Devices:  None  Non-Invasive Evaluation(s):  Cardiac CTA (12/18/2019): Coronary artery disease.  Coronary calcium score 0.  No incidental findings.  TTE (10/31/19): Normal LV size.  LVEF 50-55% with normal wall motion and diastolic function.  Normal RV size and function.  Mild mitral regurgitation.  CVP ~15 mmHg.  Exercise tolerance test (10/13/19): Low risk study without evidence of ischemia ([employer] Treadmill Score = +5).  Rare isolated PVC's noted during recovery.   Recent CV Pertinent Labs: Lab Results  Component Value Date   CHOL 194  09/24/2019   HDL 67.70 09/24/2019   LDLCALC 116 (H) 09/24/2019   TRIG 49.0 09/24/2019   CHOLHDL 3 09/24/2019   K 4.8 12/11/2019   BUN 12 12/11/2019   CREATININE 0.64 12/11/2019    Past medical and surgical history were reviewed and updated in EPIC.  Current Meds  Medication Sig  . Azelastine HCl 137 MCG/SPRAY SOLN SMARTSIG:2 Spray(s) Both Nares Twice Daily PRN  . Calcium Carbonate-Vit D-Min (CALCIUM 1200 PO) Take 2-3 tablets by mouth daily. Reported on 05/26/2015  . famotidine (PEPCID) 20 MG tablet Take 1 tablet (20 mg total) by mouth 2 (two) times daily.  . meloxicam (MOBIC) 7.5 MG tablet Take by mouth.  . nystatin-triamcinolone ointment (MYCOLOG) Apply topically.  . pantoprazole (PROTONIX) 20 MG tablet Take by mouth.   . polyethylene glycol (MIRALAX / GLYCOLAX) 17 g packet Take 17 g by mouth daily.  . pregabalin (LYRICA) 100 MG capsule Take 100 mg by mouth 2 (two) times daily.  . psyllium (REGULOID) 0.52 g capsule Take 0.52 g by mouth daily.  . tamsulosin (FLOMAX) 0.4 MG CAPS capsule Take 0.4 mg by mouth daily.  . [DISCONTINUED] furosemide (LASIX) 20 MG tablet Take 20 mg by mouth daily.    Allergies: Lac bovis, Milk-related compounds, Omeprazole, and Penicillins  Social History   Tobacco Use  . Smoking status: Never Smoker  . Smokeless tobacco: Never Used  Vaping Use  . Vaping Use: Never used  Substance Use Topics  . Alcohol use: No  . Drug use: No    Family History  Problem Relation Age of Onset  . Colon cancer Father 36  . Arthritis Father   . Hyperlipidemia Father   .  Transient ischemic attack Father   . Cancer - Colon Father 58  . Prostate cancer Father 76  . Valvular heart disease Father   . Arthritis Mother   . Heart disease Mother   . Stroke Mother        TIA  . Hypertension Mother   . Diabetes Mother   . Kidney cancer Mother 16  . Heart failure Mother   . Hyperlipidemia Mother   . Cancer Maternal Grandmother   . Cancer Paternal Grandfather         prostate  . Breast cancer Cousin 35       has contact    Review of Systems: A 12-system review of systems was performed and was negative except as noted in the HPI.  --------------------------------------------------------------------------------------------------  Physical Exam: BP 112/68 (BP Location: Left Arm, Patient Position: Sitting, Cuff Size: Normal)   Pulse 63   Ht 5\' 10"  (1.778 m)   Wt 142 lb (64.4 kg)   SpO2 98%   BMI 20.37 kg/m   General:  NAD. HEENT: No conjunctival pallor or scleral icterus. Facemask in place. Neck: Supple without lymphadenopathy, thyromegaly, JVD, or HJR. Lungs: Normal work of breathing. Clear to auscultation bilaterally without wheezes or crackles. Heart: Regular rate and rhythm without murmurs, rubs, or gallops. Non-displaced PMI. Abd: Bowel sounds present. Soft, NT/ND without hepatosplenomegaly Ext: No lower extremity edema. Radial, PT, and DP pulses are 2+ bilaterally. Skin: Warm and dry without rash.  EKG:  Normal sinus rhythm with isolated PVC.  PVC is new; otherwise, there is no significant change compared with tracing from 12/03/2019.  Lab Results  Component Value Date   WBC 6.6 10/26/2019   HGB 14.0 10/26/2019   HCT 41.7 10/26/2019   MCV 89.9 10/26/2019   PLT 232 10/26/2019    Lab Results  Component Value Date   NA 138 12/11/2019   K 4.8 12/11/2019   CL 101 12/11/2019   CO2 27 12/11/2019   BUN 12 12/11/2019   CREATININE 0.64 12/11/2019   GLUCOSE 89 12/11/2019   ALT 26 10/26/2019    Lab Results  Component Value Date   CHOL 194 09/24/2019   HDL 67.70 09/24/2019   LDLCALC 116 (H) 09/24/2019   TRIG 49.0 09/24/2019   CHOLHDL 3 09/24/2019    --------------------------------------------------------------------------------------------------  ASSESSMENT AND PLAN: Chest pain: Symptoms have resolved from prior visits.  Workup thus far has been reassuring with normal LVEF by echo and absence of CAD on cardiac CTA.  No further  workup or intervention is recommended.  Given lack of edema or other symptoms of volume overload, I think it is reasonable for Cristina Wade to use furosemide only on an as needed bases for weight gain or swelling (had been added based on previous leg swelling and elevated CVP by echo).  Follow-up: Return to clinic as needed.  Arlana Pouch, MD 04/16/2020 2:54 PM

## 2020-04-16 ENCOUNTER — Encounter: Payer: Self-pay | Admitting: Internal Medicine

## 2020-06-03 ENCOUNTER — Encounter: Payer: Self-pay | Admitting: Family

## 2020-06-17 ENCOUNTER — Telehealth (INDEPENDENT_AMBULATORY_CARE_PROVIDER_SITE_OTHER): Payer: Self-pay | Admitting: Vascular Surgery

## 2020-06-17 NOTE — Telephone Encounter (Signed)
Called stating that her legs are aching and she has a burning sensation that goes up both legs especially the left leg. Patient states that she has been in pain for the past few months and would like to come in to be seen. Patient was last seen 02/2020 with le ven reflux studies. (GS) Please advise.

## 2020-06-18 NOTE — Telephone Encounter (Signed)
Lvm for patient to callback to be scheduled  

## 2020-06-18 NOTE — Telephone Encounter (Signed)
We saw the patient several months ago and her studies showed no vascular abnormalities.  We don't treat patients for leg pain, but for vascular issues.  We can have the patient come back and redo the reflux studies and ABIs, however if those are normal, she will need to see her PCP for workup of what could be causing her pain.  Pain can also come from nerves and her back.  If she has not discussed this with her PCP yet, I would strongly suggest that she reach out to them.  She can see GS or me as the schedule allows.

## 2020-07-12 ENCOUNTER — Encounter: Payer: Self-pay | Admitting: Podiatry

## 2020-07-12 ENCOUNTER — Ambulatory Visit: Payer: BC Managed Care – PPO | Admitting: Podiatry

## 2020-07-12 ENCOUNTER — Other Ambulatory Visit: Payer: Self-pay

## 2020-07-12 DIAGNOSIS — M2170 Unequal limb length (acquired), unspecified site: Secondary | ICD-10-CM

## 2020-07-12 NOTE — Progress Notes (Signed)
Subjective:  Patient ID: Cristina Wade, female    DOB: [birth year],  MRN: [id number] HPI Chief Complaint  Patient presents with  . Foot Orthotics    Orthotic problem - patient had custom orthotics made, states they are hurting her arch, left leg shorter and does have a lift on it, but seems to make toes cramped up in shoes, right hip sticks out further than the other  . New Patient (Initial Visit)    50 y.o. female presents with the above complaint.   ROS: Denies fever chills nausea vomiting muscle aches pains calf pain back pain chest pain shortness of breath.  Past Medical History:  Diagnosis Date  . Asthma   . Chronic pelvic pain in female 2018  . Cystitis   . Cystocele   . Endometrial polyp   . Endometriosis    per pt report but never seen during surg  . Frequent headaches   . Gastritis   . GERD (gastroesophageal reflux disease)    rare  . Gross hematuria   . History of kidney stones   . Microscopic hematuria   . Migraine   . Migraines   . Neuropathy   . Osteopenia   . Urinary disorder   . UTI (lower urinary tract infection)    Past Surgical History:  Procedure Laterality Date  . COLONOSCOPY N/A 11/05/2014   Procedure: COLONOSCOPY;  Surgeon: Scot Jun, MD;  Location: [facility] ENDOSCOPY;  Service: Endoscopy;  Laterality: N/A;  . COLONOSCOPY  11/2016   Dr. Mechele Collin  . CYSTOSCOPY  2007   with biopsy   . CYSTOSCOPY N/A 06/01/2016   Procedure: CYSTOSCOPY;  Surgeon: Vena Austria, MD;  Location: [facility] ORS;  Service: Gynecology;  Laterality: N/A;  . DIAGNOSTIC LAPAROSCOPY    . DILATATION & CURETTAGE/HYSTEROSCOPY WITH MYOSURE N/A 04/13/2015   Procedure: DILATATION & CURETTAGE/HYSTEROSCOPY WITH MYOSURE/POLYPECTOMY;  Surgeon: Elenora Fender Ward, MD;  Location: [facility] ORS;  Service: Gynecology;  Laterality: N/A;  . DILATATION & CURETTAGE/HYSTEROSCOPY WITH MYOSURE N/A 06/01/2016   Procedure: DILATATION & CURETTAGE/HYSTEROSCOPY WITH MYOSURE;  Surgeon: Vena Austria, MD;   Location: [facility] ORS;  Service: Gynecology;  Laterality: N/A;  . GUM SURGERY    . laproscopy  2007  . POLYPECTOMY     endometrial    Current Outpatient Medications:  .  Azelastine HCl 137 MCG/SPRAY SOLN, SMARTSIG:2 Spray(s) Both Nares Twice Daily PRN, Disp: , Rfl:  .  Calcium Carbonate-Vit D-Min (CALCIUM 1200 PO), Take 2-3 tablets by mouth daily. Reported on 05/26/2015, Disp: , Rfl:  .  famotidine (PEPCID) 20 MG tablet, Take 1 tablet (20 mg total) by mouth 2 (two) times daily., Disp: , Rfl:  .  meloxicam (MOBIC) 7.5 MG tablet, Take by mouth., Disp: , Rfl:  .  mometasone (NASONEX) 50 MCG/ACT nasal spray, 2 sprays daily., Disp: , Rfl:  .  nystatin-triamcinolone ointment (MYCOLOG), Apply topically., Disp: , Rfl:  .  polyethylene glycol (MIRALAX / GLYCOLAX) 17 g packet, Take 17 g by mouth daily., Disp: , Rfl:  .  pregabalin (LYRICA) 100 MG capsule, Take 100 mg by mouth 2 (two) times daily., Disp: , Rfl:  .  psyllium (REGULOID) 0.52 g capsule, Take 0.52 g by mouth daily., Disp: , Rfl:   Allergies  Allergen Reactions  . Lac Bovis Other (See Comments)  . Milk-Related Compounds   . Omeprazole Other (See Comments)    Pt reports "Chest pain, HA and Dysuria."  . Penicillins Itching and Rash   Review of Systems Objective:  There were  no vitals filed for this visit.  General: Well developed, nourished, in no acute distress, alert and oriented x3   Dermatological: Skin is warm, dry and supple bilateral. Nails x 10 are well maintained; remaining integument appears unremarkable at this time. There are no open sores, no preulcerative lesions, no rash or signs of infection present.  Vascular: Dorsalis Pedis artery and Posterior Tibial artery pedal pulses are 2/4 bilateral with immedate capillary fill time. Pedal hair growth present. No varicosities and no lower extremity edema present bilateral.   Neruologic: Grossly intact via light touch bilateral. Vibratory intact via tuning fork bilateral.  Protective threshold with Semmes Wienstein monofilament intact to all pedal sites bilateral. Patellar and Achilles deep tendon reflexes 2+ bilateral. No Babinski or clonus noted bilateral.   Musculoskeletal: No gross boney pedal deformities bilateral. No pain, crepitus, or limitation noted with foot and ankle range of motion bilateral. Muscular strength 5/5 in all groups tested bilateral.  Short left leg quarter of an inch  Gait: Unassisted, Nonantalgic.    Radiographs:  None taken  Assessment & Plan:   Assessment: Leg length discrepancy.  Short left leg by about a quarter of an inch.  Plan: Recommended that she discontinue the use of the orthotics that they are painful expressing to her that we cannot fix those orthotics because they are not hours.  Also if she wanted orthotics we can make her some otherwise I think I felt heel pad would be good I gave her 4 of these and placed one in her shoe and I will follow-up with her as needed.     Annlee Glandon T. [place], [place]

## 2020-08-06 ENCOUNTER — Ambulatory Visit: Payer: BC Managed Care – PPO | Admitting: Podiatry

## 2020-08-20 ENCOUNTER — Other Ambulatory Visit: Payer: Self-pay

## 2020-08-20 ENCOUNTER — Other Ambulatory Visit: Payer: BC Managed Care – PPO

## 2020-08-20 ENCOUNTER — Encounter: Payer: Self-pay | Admitting: Family

## 2020-08-20 ENCOUNTER — Telehealth: Payer: BC Managed Care – PPO | Admitting: Family

## 2020-08-20 VITALS — Ht 70.0 in | Wt 150.0 lb

## 2020-08-20 DIAGNOSIS — R300 Dysuria: Secondary | ICD-10-CM

## 2020-08-20 DIAGNOSIS — N319 Neuromuscular dysfunction of bladder, unspecified: Secondary | ICD-10-CM | POA: Insufficient documentation

## 2020-08-20 MED ORDER — NITROFURANTOIN MONOHYD MACRO 100 MG PO CAPS: 100.0000 mg | ORAL_CAPSULE | Freq: Two times a day (BID) | ORAL | 0 refills | Status: DC

## 2020-08-20 NOTE — Patient Instructions (Signed)
Start macrobid if symptoms worsen Pending urine culture Ensure to take probiotics while on antibiotics and also for 2 weeks after completion. This can either be by eating yogurt daily or taking a probiotic supplement over the counter such as Culturelle.It is important to re-colonize the gut with good bacteria and also to prevent any diarrheal infections associated with antibiotic use.     Urinary Tract Infection, Adult  A urinary tract infection (UTI) is an infection of any part of the urinary tract. The urinary tract includes the kidneys, ureters, bladder, and urethra. These organs make, store, and get rid of urine in the body. An upper UTI affects the ureters and kidneys. A lower UTI affects the bladder and urethra. What are the causes? Most urinary tract infections are caused by bacteria in your genital area around your urethra, where urine leaves your body. These bacteria grow and cause inflammation of your urinary tract. What increases the risk? You are more likely to develop this condition if:  You have a urinary catheter that stays in place.  You are not able to control when you urinate or have a bowel movement (incontinence).  You are female and you: ? Use a spermicide or diaphragm for birth control. ? Have low estrogen levels. ? Are pregnant.  You have certain genes that increase your risk.  You are sexually active.  You take antibiotic medicines.  You have a condition that causes your flow of urine to slow down, such as: ? An enlarged prostate, if you are female. ? Blockage in your urethra. ? A kidney stone. ? A nerve condition that affects your bladder control (neurogenic bladder). ? Not getting enough to drink, or not urinating often.  You have certain medical conditions, such as: ? Diabetes. ? A weak disease-fighting system (immunesystem). ? Sickle cell disease. ? Gout. ? Spinal cord injury. What are the signs or symptoms? Symptoms of this condition  include:  Needing to urinate right away (urgency).  Frequent urination. This may include small amounts of urine each time you urinate.  Pain or burning with urination.  Blood in the urine.  Urine that smells bad or unusual.  Trouble urinating.  Cloudy urine.  Vaginal discharge, if you are female.  Pain in the abdomen or the lower back. You may also have:  Vomiting or a decreased appetite.  Confusion.  Irritability or tiredness.  A fever or chills.  Diarrhea. The first symptom in older adults may be confusion. In some cases, they may not have any symptoms until the infection has worsened. How is this diagnosed? This condition is diagnosed based on your medical history and a physical exam. You may also have other tests, including:  Urine tests.  Blood tests.  Tests for STIs (sexually transmitted infections). If you have had more than one UTI, a cystoscopy or imaging studies may be done to determine the cause of the infections. How is this treated? Treatment for this condition includes:  Antibiotic medicine.  Over-the-counter medicines to treat discomfort.  Drinking enough water to stay hydrated. If you have frequent infections or have other conditions such as a kidney stone, you may need to see a health care provider who specializes in the urinary tract (urologist). In rare cases, urinary tract infections can cause sepsis. Sepsis is a life-threatening condition that occurs when the body responds to an infection. Sepsis is treated in the hospital with IV antibiotics, fluids, and other medicines. Follow these instructions at home: Medicines  Take over-the-counter and prescription medicines  only as told by your health care provider.  If you were prescribed an antibiotic medicine, take it as told by your health care provider. Do not stop using the antibiotic even if you start to feel better. General instructions  Make sure you: ? Empty your bladder often and  completely. Do not hold urine for long periods of time. ? Empty your bladder after sex. ? Wipe from front to back after urinating or having a bowel movement if you are female. Use each tissue only one time when you wipe.  Drink enough fluid to keep your urine pale yellow.  Keep all follow-up visits. This is important.   Contact a health care provider if:  Your symptoms do not get better after 1-2 days.  Your symptoms go away and then return. Get help right away if:  You have severe pain in your back or your lower abdomen.  You have a fever or chills.  You have nausea or vomiting. Summary  A urinary tract infection (UTI) is an infection of any part of the urinary tract, which includes the kidneys, ureters, bladder, and urethra.  Most urinary tract infections are caused by bacteria in your genital area.  Treatment for this condition often includes antibiotic medicines.  If you were prescribed an antibiotic medicine, take it as told by your health care provider. Do not stop using the antibiotic even if you start to feel better.  Keep all follow-up visits. This is important. This information is not intended to replace advice given to you by your health care provider. Make sure you discuss any questions you have with your health care provider. Document Revised: 11/14/2019 Document Reviewed: 11/14/2019 Elsevier Patient Education  2021 [employer].

## 2020-08-20 NOTE — Assessment & Plan Note (Signed)
Chronic, stable. She follows with Dr Sherryll Burger, Dr Annabell Howells. Will follow

## 2020-08-20 NOTE — Progress Notes (Signed)
Verbal consent for services obtained from patient prior to services given to TELEPHONE visit:   Location of call:  provider at work patient at home  Names of all persons present for services: Rennie Plowman, NP and patient Chief complaint: acute visit  Complains of cloudy urine, odor to urine and suprapubic pain  X 6 days, waxes and wanes  No fever, N, V, severe back pain, hematuria.   She is continues to self catheterization once per day.  Following [facility], Dr Annabell Howells.  Reports last UTI was 2 months ago, and she states used bactrim.   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 Last seen 04/2020 She required self catheterization and constipation which has spontaneously resolved. She completed bladder - tibial nerve stimulation without improvement. Continues to take lyrica as prescribed by Dr Sherryll Burger.       A/P/next steps:  Problem List Items Addressed This Visit      Other   Dysuria - Primary    Afebrile. She self catheterizes daily. She will drop of urine specimen today.  I have sent in macrobid ahead of urine culture as we approach weekend and patient will start macrobid if symptoms worsen over weekend. If she develops fever, severe back, pelvic pain, she is aware to go to UC.       Relevant Medications   nitrofurantoin, macrocrystal-monohydrate, (MACROBID) 100 MG capsule   Neurogenic bladder    Chronic, stable. She follows with Dr Sherryll Burger, Dr Annabell Howells. Will follow          I spent 15 min  discussing plan of care over the phone.

## 2020-08-20 NOTE — Assessment & Plan Note (Signed)
Afebrile. She self catheterizes daily. She will drop of urine specimen today.  I have sent in macrobid ahead of urine culture as we approach weekend and patient will start macrobid if symptoms worsen over weekend. If she develops fever, severe back, pelvic pain, she is aware to go to UC.

## 2020-08-22 LAB — URINE CULTURE
MICRO NUMBER:: 11860015
SPECIMEN QUALITY:: ADEQUATE

## 2020-08-22 LAB — URINALYSIS, ROUTINE W REFLEX MICROSCOPIC
Bilirubin Urine: NEGATIVE
Glucose, UA: NEGATIVE
Hgb urine dipstick: NEGATIVE
Hyaline Cast: NONE SEEN /LPF
Ketones, ur: NEGATIVE
Nitrite: POSITIVE — AB
Protein, ur: NEGATIVE
RBC / HPF: NONE SEEN /HPF (ref 0–2)
Specific Gravity, Urine: 1.004 (ref 1.001–1.035)
pH: 7.5 (ref 5.0–8.0)

## 2020-08-23 ENCOUNTER — Other Ambulatory Visit: Payer: Self-pay

## 2020-08-23 DIAGNOSIS — R300 Dysuria: Secondary | ICD-10-CM

## 2020-08-24 ENCOUNTER — Encounter: Payer: Self-pay | Admitting: Urology

## 2020-08-24 ENCOUNTER — Ambulatory Visit (INDEPENDENT_AMBULATORY_CARE_PROVIDER_SITE_OTHER): Payer: BC Managed Care – PPO | Admitting: Urology

## 2020-08-24 ENCOUNTER — Other Ambulatory Visit: Payer: Self-pay

## 2020-08-24 VITALS — BP 133/78 | HR 65

## 2020-08-24 DIAGNOSIS — R109 Unspecified abdominal pain: Secondary | ICD-10-CM | POA: Diagnosis not present

## 2020-08-24 DIAGNOSIS — N319 Neuromuscular dysfunction of bladder, unspecified: Secondary | ICD-10-CM

## 2020-08-24 DIAGNOSIS — N3000 Acute cystitis without hematuria: Secondary | ICD-10-CM

## 2020-08-24 NOTE — Progress Notes (Signed)
08/24/2020 4:41 PM   Cristina Wade 04-Dec-1970 [id number]  Referring provider: [name], FNP [address] [place],  [place] [address]  Chief Complaint  Patient presents with  . Flank Pain    HPI: 50 year old female with neurogenic bladder managed on CIC, recurrent UTIs who presents today to discuss "kidney pain".  She also sees Dr. Annabell Howells most recently for PTNS.  She completed this course as well as with pelvic therapy.  She is down to self cath only once daily at this point and getting out approximately 300 cc in the evening time.  Today she reports over the past 2 weeks, she has had increased burning with urination urgency, frequency and pain in both of her kidneys, left greater than right.  She was seen by her PCP on 08/20/2020 which grew Citrobacter.  She was started on Macrobid.  She still has 2 days of this.  All of her symptoms are improving.  She is very worried today about history of of a subcentimeter cyst seen on her left upper pole kidney dating back to 03/2019.  This was not really appreciated on subsequent imaging.  Her last imaging was about a year ago.   PMH: Past Medical History:  Diagnosis Date  . Asthma   . Chronic pelvic pain in female 2018  . Cystitis   . Cystocele   . Endometrial polyp   . Endometriosis    per pt report but never seen during surg  . Frequent headaches   . Gastritis   . GERD (gastroesophageal reflux disease)    rare  . Gross hematuria   . History of kidney stones   . Microscopic hematuria   . Migraine   . Migraines   . Neuropathy   . Osteopenia   . Urinary disorder   . UTI (lower urinary tract infection)     Surgical History: Past Surgical History:  Procedure Laterality Date  . COLONOSCOPY N/A 11/05/2014   Procedure: COLONOSCOPY;  Surgeon: Scot Jun, MD;  Location: [facility] ENDOSCOPY;  Service: Endoscopy;  Laterality: N/A;  . COLONOSCOPY  11/2016   Dr. Mechele Collin  . CYSTOSCOPY  2007   with biopsy   .  CYSTOSCOPY N/A 06/01/2016   Procedure: CYSTOSCOPY;  Surgeon: Vena Austria, MD;  Location: [facility] ORS;  Service: Gynecology;  Laterality: N/A;  . DIAGNOSTIC LAPAROSCOPY    . DILATATION & CURETTAGE/HYSTEROSCOPY WITH MYOSURE N/A 04/13/2015   Procedure: DILATATION & CURETTAGE/HYSTEROSCOPY WITH MYOSURE/POLYPECTOMY;  Surgeon: Elenora Fender Ward, MD;  Location: [facility] ORS;  Service: Gynecology;  Laterality: N/A;  . DILATATION & CURETTAGE/HYSTEROSCOPY WITH MYOSURE N/A 06/01/2016   Procedure: DILATATION & CURETTAGE/HYSTEROSCOPY WITH MYOSURE;  Surgeon: Vena Austria, MD;  Location: [facility] ORS;  Service: Gynecology;  Laterality: N/A;  . GUM SURGERY    . laproscopy  2007  . POLYPECTOMY     endometrial    Home Medications:  Allergies as of 08/24/2020      Reactions   Lac Bovis Other (See Comments)   Milk-related Compounds    Omeprazole Other (See Comments)   Pt reports "Chest pain, HA and Dysuria."   Penicillins Itching, Rash      Medication List       Accurate as of Aug 24, 2020  4:41 PM. If you have any questions, ask your nurse or doctor.        Azelastine HCl 137 MCG/SPRAY Soln SMARTSIG:2 Spray(s) Both Nares Twice Daily PRN   CALCIUM 1200 PO Take 2-3 tablets by mouth daily.  Reported on 05/26/2015   famotidine 20 MG tablet Commonly known as: PEPCID Take 1 tablet (20 mg total) by mouth 2 (two) times daily.   meloxicam 7.5 MG tablet Commonly known as: MOBIC Take by mouth.   mometasone 50 MCG/ACT nasal spray Commonly known as: NASONEX 2 sprays daily.   nitrofurantoin (macrocrystal-monohydrate) 100 MG capsule Commonly known as: Macrobid Take 1 capsule (100 mg total) by mouth 2 (two) times daily. Take with food.   nystatin-triamcinolone ointment Commonly known as: MYCOLOG Apply topically.   polyethylene glycol 17 g packet Commonly known as: MIRALAX / GLYCOLAX Take 17 g by mouth daily.   pregabalin 100 MG capsule Commonly known as: LYRICA Take 100 mg by mouth 2 (two) times  daily.   psyllium 0.52 g capsule Commonly known as: REGULOID Take 0.52 g by mouth daily.       Allergies:  Allergies  Allergen Reactions  . Lac Bovis Other (See Comments)  . Milk-Related Compounds   . Omeprazole Other (See Comments)    Pt reports "Chest pain, HA and Dysuria."  . Penicillins Itching and Rash    Family History: Family History  Problem Relation Age of Onset  . Colon cancer Father 62  . Arthritis Father   . Hyperlipidemia Father   . Transient ischemic attack Father   . Cancer - Colon Father 21  . Prostate cancer Father 47  . Valvular heart disease Father   . Arthritis Mother   . Heart disease Mother   . Stroke Mother        TIA  . Hypertension Mother   . Diabetes Mother   . Kidney cancer Mother 70  . Heart failure Mother   . Hyperlipidemia Mother   . Cancer Maternal Grandmother   . Cancer Paternal Grandfather        prostate  . Breast cancer Cousin 35       has contact    Social History:  reports that she has never smoked. She has never used smokeless tobacco. She reports that she does not drink alcohol and does not use drugs.   Physical Exam: BP 133/78   Pulse 65   Constitutional:  Alert and oriented, No acute distress. HEENT:  AT, moist mucus membranes.  Trachea midline, no masses. Cardiovascular: No clubbing, cyanosis, or edema. Respiratory: Normal respiratory effort, no increased work of breathing. Skin: No rashes, bruises or suspicious lesions. Neurologic: Grossly intact, no focal deficits, moving all 4 extremities. Psychiatric: Normal mood and affect.  Laboratory Data: Lab Results  Component Value Date   WBC 6.6 10/26/2019   HGB 14.0 10/26/2019   HCT 41.7 10/26/2019   MCV 89.9 10/26/2019   PLT 232 10/26/2019    Lab Results  Component Value Date   CREATININE 0.64 12/11/2019    Assessment & Plan:    1. Neurogenic bladder Managed with self cath once daily - Ultrasound renal complete; Future  2. Acute cystitis without  hematuria Currently actively being treated for an infection, symptoms improving  Complete course of Macrobid, this antibiotic appears to be appropriate  3. Flank pain Nonspecific flank pain, may be related to #2  She is somewhat fixated on the idea that it is related to her cyst, will reimage with renal ultrasound to help reassure her   F/u 3 months with PA  Vanna Scotland, MD  [facility] [address], [address] [place], [place] [address] [phone]

## 2020-08-25 ENCOUNTER — Telehealth: Payer: Self-pay | Admitting: Family Medicine

## 2020-08-25 NOTE — Telephone Encounter (Signed)
Bladder ultrasound is part of renal ultrasound so it will be imaged.  Cristina Scotland, MD

## 2020-08-25 NOTE — Telephone Encounter (Signed)
Patient left message stating she saw you yesterday and your ordered a renal US but she also wants a image for her bladder pain. Please advise

## 2020-08-25 NOTE — Telephone Encounter (Signed)
She saw Dr. Apolinar Junes yesterday.

## 2020-08-26 NOTE — Telephone Encounter (Signed)
Called patient to discuss further-unable to leave VM

## 2020-09-16 ENCOUNTER — Telehealth: Payer: Self-pay | Admitting: Family

## 2020-09-16 NOTE — Telephone Encounter (Signed)
Just FYI she sees urology tomorrow afternoon.

## 2020-09-16 NOTE — Telephone Encounter (Signed)
Patient called in stated that she may have A UTI having back pain burning when urinating was transferred to access nurse

## 2020-09-16 NOTE — Telephone Encounter (Signed)
Pt called and states that access nurse told her to be seen within 24 hours. I offered her the only appt that we have available- 10:30 with PCP on 09/17/20. Pt states that she would be at work at that time and states that she will just keep her appt at [facility] tomorrow at 2:30. FYI

## 2020-09-17 ENCOUNTER — Ambulatory Visit: Payer: BC Managed Care – PPO | Admitting: Physician Assistant

## 2020-09-21 ENCOUNTER — Ambulatory Visit: Payer: BC Managed Care – PPO | Admitting: Adult Health

## 2020-09-22 ENCOUNTER — Emergency Department: Payer: BC Managed Care – PPO

## 2020-09-22 ENCOUNTER — Encounter: Payer: Self-pay | Admitting: Emergency Medicine

## 2020-09-22 ENCOUNTER — Emergency Department
Admission: EM | Admit: 2020-09-22 | Discharge: 2020-09-22 | Disposition: A | Discharge status: Home / Self Care | Payer: BC Managed Care – PPO | Attending: Emergency Medicine | Admitting: Emergency Medicine

## 2020-09-22 ENCOUNTER — Other Ambulatory Visit: Payer: Self-pay

## 2020-09-22 DIAGNOSIS — R079 Chest pain, unspecified: Secondary | ICD-10-CM

## 2020-09-22 DIAGNOSIS — Z7952 Long term (current) use of systemic steroids: Secondary | ICD-10-CM | POA: Diagnosis not present

## 2020-09-22 DIAGNOSIS — R0789 Other chest pain: Secondary | ICD-10-CM | POA: Diagnosis not present

## 2020-09-22 DIAGNOSIS — J45909 Unspecified asthma, uncomplicated: Secondary | ICD-10-CM | POA: Diagnosis not present

## 2020-09-22 LAB — TROPONIN I (HIGH SENSITIVITY)
Troponin I (High Sensitivity): 2 ng/L (ref <18)
Troponin I (High Sensitivity): 2 ng/L (ref <18)

## 2020-09-22 LAB — BASIC METABOLIC PANEL
Anion gap: 5 (ref 5–15)
BUN: 13 mg/dL (ref 6–20)
CO2: 27 mmol/L (ref 22–32)
Calcium: 9.3 mg/dL (ref 8.9–10.3)
Chloride: 106 mmol/L (ref 98–111)
Creatinine, Ser: 0.7 mg/dL (ref 0.44–1.00)
GFR, Estimated: >60 mL/min (ref >60)
Glucose, Bld: 105 mg/dL — ABNORMAL HIGH (ref 70–99)
Potassium: 3.5 mmol/L (ref 3.5–5.1)
Sodium: 138 mmol/L (ref 135–145)

## 2020-09-22 LAB — CBC
HCT: 38.7 % (ref 36.0–46.0)
Hemoglobin: 13.1 g/dL (ref 12.0–15.0)
MCH: 30.2 pg (ref 26.0–34.0)
MCHC: 33.9 g/dL (ref 30.0–36.0)
MCV: 89.2 fL (ref 80.0–100.0)
Platelets: 160 10*3/uL (ref 150–400)
RBC: 4.34 MIL/uL (ref 3.87–5.11)
RDW: 12.2 % (ref 11.5–15.5)
WBC: 5.1 10*3/uL (ref 4.0–10.5)
nRBC: 0 % (ref 0.0–0.2)

## 2020-09-22 NOTE — ED Triage Notes (Signed)
Pt comes into the ED via [facility] clinic for central and left side chest pain that has been ongoing x 1 week.  PT denies any radiating pain, SHOB, dizziness, or nausea.  Pt ambulatory to triage at this time.  Pt denies any known cardiac history other than needing fluid pills.  Pt has even and unlabored respirations.  Pt denies any cough or congestion.

## 2020-09-22 NOTE — ED Provider Notes (Signed)
[facility] Emergency Department Provider Note   ____________________________________________   Event Date/Time   First MD Initiated Contact with Patient 09/22/20 1635     (approximate)  I have reviewed the triage vital signs and the nursing notes.   HISTORY  Chief Complaint Chest Pain    HPI Cristina Wade is a 50 y.o. female with past medical history of hyperlipidemia, asthma, migraine, and GERD who presents to the ED complaining of chest pain.  Patient reports that she has had 1 week of intermittent pain in the left side of her chest.  She describes it as a dull pressure that is not exacerbated or alleviated by anything in particular.  She has not had any fevers, cough, shortness of breath, pain or swelling in her legs.  She has never had similar symptoms in the past and denies any cardiac history.        Past Medical History:  Diagnosis Date  . Asthma   . Chronic pelvic pain in female 2018  . Cystitis   . Cystocele   . Endometrial polyp   . Endometriosis    per pt report but never seen during surg  . Frequent headaches   . Gastritis   . GERD (gastroesophageal reflux disease)    rare  . Gross hematuria   . History of kidney stones   . Microscopic hematuria   . Migraine   . Migraines   . Neuropathy   . Osteopenia   . Urinary disorder   . UTI (lower urinary tract infection)     Patient Active Problem List   Diagnosis Date Noted  . Dysuria 08/20/2020  . Neurogenic bladder 08/20/2020  . Pelvic pain in female 12/25/2019  . Chronic low back pain 12/25/2019  . Chronic venous insufficiency 12/24/2019  . Lymphedema 12/24/2019  . Lower extremity edema 12/03/2019  . HLD (hyperlipidemia) 10/10/2019  . Low back pain radiating to left leg 10/04/2019  . Dyspnea on exertion 09/20/2019  . Orthopnea 09/20/2019  . Chronic abdominal pain 05/14/2019  . Irritable bowel syndrome with constipation 05/14/2019  . Chronic constipation 12/25/2018  .  Urinary retention 08/12/2018  . Anxiety 08/12/2018  . Elevated blood pressure reading without diagnosis of hypertension 07/12/2018  . Left leg pain 07/12/2018  . Acute left-sided low back pain 06/03/2018  . Cloudy urine 04/29/2018  . Chest pain 04/29/2018  . Frontal headache 04/27/2018  . Localized, primary osteoarthritis 02/07/2018  . Pelvic pain 01/30/2018  . Slow transit constipation 10/10/2017  . Bilateral impacted cerumen 11/28/2016  . Chronic bilateral low back pain without sciatica 11/28/2016  . Precordial pain 07/07/2016  . Right bundle branch block (RBBB) on electrocardiogram (ECG) 07/07/2016  . Chest tightness 06/27/2016  . Preventative health care 09/30/2015  . Encounter to establish care 09/28/2014  . Vestibular migraine 09/28/2014  . Encounter for screening colonoscopy 11/21/2013    Past Surgical History:  Procedure Laterality Date  . COLONOSCOPY N/A 11/05/2014   Procedure: COLONOSCOPY;  Surgeon: Scot Jun, MD;  Location: [facility] ENDOSCOPY;  Service: Endoscopy;  Laterality: N/A;  . COLONOSCOPY  11/2016   Dr. Mechele Collin  . CYSTOSCOPY  2007   with biopsy   . CYSTOSCOPY N/A 06/01/2016   Procedure: CYSTOSCOPY;  Surgeon: Vena Austria, MD;  Location: [facility] ORS;  Service: Gynecology;  Laterality: N/A;  . DIAGNOSTIC LAPAROSCOPY    . DILATATION & CURETTAGE/HYSTEROSCOPY WITH MYOSURE N/A 04/13/2015   Procedure: DILATATION & CURETTAGE/HYSTEROSCOPY WITH MYOSURE/POLYPECTOMY;  Surgeon: Elenora Fender Ward, MD;  Location:  [facility] ORS;  Service: Gynecology;  Laterality: N/A;  . DILATATION & CURETTAGE/HYSTEROSCOPY WITH MYOSURE N/A 06/01/2016   Procedure: DILATATION & CURETTAGE/HYSTEROSCOPY WITH MYOSURE;  Surgeon: Vena Austria, MD;  Location: [facility] ORS;  Service: Gynecology;  Laterality: N/A;  . GUM SURGERY    . laproscopy  2007  . POLYPECTOMY     endometrial    Prior to Admission medications   Medication Sig Start Date End Date Taking? Authorizing Provider  Azelastine HCl 137  MCG/SPRAY SOLN SMARTSIG:2 Spray(s) Both Nares Twice Daily PRN 07/21/19   [provider]  Calcium Carbonate-Vit D-Min (CALCIUM 1200 PO) Take 2-3 tablets by mouth daily. Reported on 05/26/2015    [provider]  famotidine (PEPCID) 20 MG tablet Take 1 tablet (20 mg total) by mouth 2 (two) times daily. 12/03/19   End, Cristal Deer, MD  meloxicam (MOBIC) 7.5 MG tablet Take by mouth. 01/13/20 01/12/21  [provider]  mometasone (NASONEX) 50 MCG/ACT nasal spray 2 sprays daily. 06/26/20   [provider]  nitrofurantoin, macrocrystal-monohydrate, (MACROBID) 100 MG capsule Take 1 capsule (100 mg total) by mouth 2 (two) times daily. Take with food. 08/20/20   [name], FNP  nystatin-triamcinolone ointment (MYCOLOG) Apply topically. 01/13/20 01/12/21  [provider]  polyethylene glycol (MIRALAX / GLYCOLAX) 17 g packet Take 17 g by mouth daily.    [provider]  pregabalin (LYRICA) 100 MG capsule Take 100 mg by mouth 2 (two) times daily.    [provider]  psyllium (REGULOID) 0.52 g capsule Take 0.52 g by mouth daily.    [provider]    Allergies Lac bovis, Milk-related compounds, Omeprazole, and Penicillins  Family History  Problem Relation Age of Onset  . Colon cancer Father 30  . Arthritis Father   . Hyperlipidemia Father   . Transient ischemic attack Father   . Cancer - Colon Father 27  . Prostate cancer Father 42  . Valvular heart disease Father   . Arthritis Mother   . Heart disease Mother   . Stroke Mother        TIA  . Hypertension Mother   . Diabetes Mother   . Kidney cancer Mother 54  . Heart failure Mother   . Hyperlipidemia Mother   . Cancer Maternal Grandmother   . Cancer Paternal Grandfather        prostate  . Breast cancer Cousin 35       has contact    Social History Social History   Tobacco Use  . Smoking status: Never Smoker  . Smokeless tobacco: Never Used  Vaping Use  . Vaping Use:  Never used  Substance Use Topics  . Alcohol use: No  . Drug use: No    Review of Systems  Constitutional: No fever/chills Eyes: No visual changes. ENT: No sore throat. Cardiovascular: Positive for chest pain. Respiratory: Denies shortness of breath. Gastrointestinal: No abdominal pain.  No nausea, no vomiting.  No diarrhea.  No constipation. Genitourinary: Negative for dysuria. Musculoskeletal: Negative for back pain. Skin: Negative for rash. Neurological: Negative for headaches, focal weakness or numbness.  ____________________________________________   PHYSICAL EXAM:  VITAL SIGNS: ED Triage Vitals  Enc Vitals Group     BP 09/22/20 1453 115/73     Pulse Rate 09/22/20 1453 63     Resp 09/22/20 1453 18     Temp 09/22/20 1453 98.5 F (36.9 C)     Temp Source 09/22/20 1453 Oral     SpO2 09/22/20 1453  98 %     Weight 09/22/20 1450 144 lb (65.3 kg)     Height 09/22/20 1450 5\' 10"  (1.778 m)     Head Circumference --      Peak Flow --      Pain Score 09/22/20 1450 7     Pain Loc --      Pain Edu? --      Excl. in GC? --     Constitutional: Alert and oriented. Eyes: Conjunctivae are normal. Head: Atraumatic. Nose: No congestion/rhinnorhea. Mouth/Throat: Mucous membranes are moist. Neck: Normal ROM Cardiovascular: Normal rate, regular rhythm. Grossly normal heart sounds.  2+ radial pulses bilaterally. Respiratory: Normal respiratory effort.  No retractions. Lungs CTAB.  Left chest wall tenderness to palpation. Gastrointestinal: Soft and nontender. No distention. Genitourinary: deferred Musculoskeletal: No lower extremity tenderness nor edema. Neurologic:  Normal speech and language. No gross focal neurologic deficits are appreciated. Skin:  Skin is warm, dry and intact. No rash noted. Psychiatric: Mood and affect are normal. Speech and behavior are normal.  ____________________________________________   LABS (all labs ordered are listed, but only abnormal results  are displayed)  Labs Reviewed  BASIC METABOLIC PANEL - Abnormal; Notable for the following components:      Result Value   Glucose, Bld 105 (*)    All other components within normal limits  CBC  POC URINE PREG, ED  TROPONIN I (HIGH SENSITIVITY)  TROPONIN I (HIGH SENSITIVITY)   ____________________________________________  EKG  ED ECG REPORT I, 11/22/20, the attending physician, personally viewed and interpreted this ECG.   Date: 09/22/2020  EKG Time: 14:50  Rate: 63  Rhythm: normal sinus rhythm  Axis: RAD  Intervals:Incomplete RBBB  ST&T Change: None   PROCEDURES  Procedure(s) performed (including Critical Care):  Procedures   ____________________________________________   INITIAL IMPRESSION / ASSESSMENT AND PLAN / ED COURSE       50 year old female with past medical history of hyperlipidemia, asthma, migraine, and GERD who presents to the ED for intermittent left chest wall pressure over the past week.  She does currently endorse pain although pain is reproducible with palpation to her left chest.  EKG shows no evidence of arrhythmia or ischemia and initial troponin is negative.  Remainder of labs are unremarkable, chest x-ray reviewed by me and shows no infiltrate, edema, or effusion.  Given reproducible pain with reassuring work-up, I suspect patient's pain is musculoskeletal.  We will check repeat troponin but if this is negative patient will be appropriate for discharge home given her heart score of less than 4.  She was offered Toradol but declines.  Repeat troponin within normal limits, patient is appropriate for discharge home with PCP follow-up.  She was counseled to return to the ED for new worsening symptoms, patient agrees with plan.      ____________________________________________   FINAL CLINICAL IMPRESSION(S) / ED DIAGNOSES  Final diagnoses:  Nonspecific chest pain     ED Discharge Orders    None       Note:  This document was  prepared using Dragon voice recognition software and may include unintentional dictation errors.   42, MD 09/22/20 [phone]

## 2020-09-23 ENCOUNTER — Other Ambulatory Visit: Payer: BC Managed Care – PPO

## 2020-10-04 ENCOUNTER — Emergency Department: Payer: BC Managed Care – PPO

## 2020-10-04 ENCOUNTER — Other Ambulatory Visit: Payer: Self-pay

## 2020-10-04 ENCOUNTER — Telehealth: Payer: Self-pay

## 2020-10-04 ENCOUNTER — Emergency Department
Admission: EM | Admit: 2020-10-04 | Discharge: 2020-10-04 | Disposition: A | Discharge status: Home / Self Care | Payer: BC Managed Care – PPO | Attending: Student in an Organized Health Care Education/Training Program | Admitting: Student in an Organized Health Care Education/Training Program

## 2020-10-04 ENCOUNTER — Encounter: Payer: Self-pay | Admitting: Emergency Medicine

## 2020-10-04 DIAGNOSIS — R1084 Generalized abdominal pain: Secondary | ICD-10-CM

## 2020-10-04 DIAGNOSIS — J45909 Unspecified asthma, uncomplicated: Secondary | ICD-10-CM | POA: Insufficient documentation

## 2020-10-04 DIAGNOSIS — R112 Nausea with vomiting, unspecified: Secondary | ICD-10-CM | POA: Diagnosis not present

## 2020-10-04 LAB — COMPREHENSIVE METABOLIC PANEL
ALT: 25 U/L (ref 0–44)
AST: 27 U/L (ref 15–41)
Albumin: 4.4 g/dL (ref 3.5–5.0)
Alkaline Phosphatase: 49 U/L (ref 38–126)
Anion gap: 8 (ref 5–15)
BUN: 18 mg/dL (ref 6–20)
CO2: 26 mmol/L (ref 22–32)
Calcium: 9.2 mg/dL (ref 8.9–10.3)
Chloride: 105 mmol/L (ref 98–111)
Creatinine, Ser: 0.82 mg/dL (ref 0.44–1.00)
GFR, Estimated: >60 mL/min (ref >60)
Glucose, Bld: 114 mg/dL — ABNORMAL HIGH (ref 70–99)
Potassium: 4.1 mmol/L (ref 3.5–5.1)
Sodium: 139 mmol/L (ref 135–145)
Total Bilirubin: 0.9 mg/dL (ref 0.3–1.2)
Total Protein: 7.6 g/dL (ref 6.5–8.1)

## 2020-10-04 LAB — URINALYSIS, COMPLETE (UACMP) WITH MICROSCOPIC
Bilirubin Urine: NEGATIVE
Glucose, UA: NEGATIVE mg/dL
Ketones, ur: 20 mg/dL — AB
Nitrite: NEGATIVE
Protein, ur: NEGATIVE mg/dL
Specific Gravity, Urine: 1.023 (ref 1.005–1.030)
WBC, UA: >50 WBC/hpf — ABNORMAL HIGH (ref 0–5)
pH: 5 (ref 5.0–8.0)

## 2020-10-04 LAB — CBC
HCT: 40.3 % (ref 36.0–46.0)
Hemoglobin: 14 g/dL (ref 12.0–15.0)
MCH: 30.2 pg (ref 26.0–34.0)
MCHC: 34.7 g/dL (ref 30.0–36.0)
MCV: 87 fL (ref 80.0–100.0)
Platelets: 200 10*3/uL (ref 150–400)
RBC: 4.63 MIL/uL (ref 3.87–5.11)
RDW: 12.2 % (ref 11.5–15.5)
WBC: 6.9 10*3/uL (ref 4.0–10.5)
nRBC: 0 % (ref 0.0–0.2)

## 2020-10-04 LAB — LIPASE, BLOOD: Lipase: 97 U/L — ABNORMAL HIGH (ref 11–51)

## 2020-10-04 MED ORDER — NAPROXEN 500 MG PO TABS
500.0000 mg | ORAL_TABLET | Freq: Once | ORAL | Status: AC
  Administered 2020-10-04: 500 mg via ORAL
  Filled 2020-10-04: qty 1

## 2020-10-04 MED ORDER — ONDANSETRON HCL 4 MG PO TABS: 4.0000 mg | ORAL_TABLET | Freq: Every day | ORAL | 0 refills | Status: DC | PRN

## 2020-10-04 MED ORDER — SODIUM CHLORIDE 0.9 % IV BOLUS
1000.0000 mL | Freq: Once | INTRAVENOUS | Status: AC
  Administered 2020-10-04: 1000 mL via INTRAVENOUS

## 2020-10-04 MED ORDER — DICYCLOMINE HCL 10 MG PO CAPS: 10.0000 mg | ORAL_CAPSULE | Freq: Three times a day (TID) | ORAL | 0 refills | Status: DC | PRN

## 2020-10-04 MED ORDER — ACETAMINOPHEN 500 MG PO TABS
1000.0000 mg | ORAL_TABLET | Freq: Once | ORAL | Status: AC
  Administered 2020-10-04: 1000 mg via ORAL
  Filled 2020-10-04: qty 2

## 2020-10-04 MED ORDER — CEPHALEXIN 500 MG PO CAPS: 500.0000 mg | ORAL_CAPSULE | Freq: Three times a day (TID) | ORAL | 0 refills | Status: AC

## 2020-10-04 NOTE — ED Provider Notes (Signed)
[facility] Emergency Department Provider Note    Event Date/Time   First MD Initiated Contact with Patient 10/04/20 [phone]     (approximate)  I have reviewed the triage vital signs and the nursing notes.   HISTORY  Chief Complaint Abdominal Pain    HPI Cristina Wade is a 50 y.o. female below listed past medical history presents to the ER for evaluation of mild epigastric generalized abdominal pain associate with some nausea and vomiting for the past several days.  Is tolerating p.o. liquids but has not had much appetite.  She is passing gas and moving her bowels.  Does have a history of constipation.  Past Medical History:  Diagnosis Date   Asthma    Chronic pelvic pain in female 2018   Cystitis    Cystocele    Endometrial polyp    Endometriosis    per pt report but never seen during surg   Frequent headaches    Gastritis    GERD (gastroesophageal reflux disease)    rare   Gross hematuria    History of kidney stones    Microscopic hematuria    Migraine    Migraines    Neuropathy    Osteopenia    Urinary disorder    UTI (lower urinary tract infection)    Family History  Problem Relation Age of Onset   Colon cancer Father 3550   Arthritis Father    Hyperlipidemia Father    Transient ischemic attack Father    Cancer - Colon Father [phone]   Prostate cancer Father 5668   Valvular heart disease Father    Arthritis Mother    Heart disease Mother    Stroke Mother        TIA   Hypertension Mother    Diabetes Mother    Kidney cancer Mother 3945   Heart failure Mother    Hyperlipidemia Mother    Cancer Maternal Grandmother    Cancer Paternal Grandfather        prostate   Breast cancer Cousin [phone]       has contact   Past Surgical History:  Procedure Laterality Date   COLONOSCOPY N/A 11/05/2014   Procedure: COLONOSCOPY;  Surgeon: Scot Junobert T Elliott, MD;  [facility] ENDOSCOPY;  Service: Endoscopy;  Laterality: N/A;   COLONOSCOPY  11/2016   Dr.  Mechele CollinElliott   CYSTOSCOPY  2007   with biopsy    CYSTOSCOPY N/A 06/01/2016   Procedure: CYSTOSCOPY;  Surgeon: Vena AustriaAndreas Staebler, MD;  Location: [facility] ORS;  Service: Gynecology;  Laterality: N/A;   DIAGNOSTIC LAPAROSCOPY     DILATATION & CURETTAGE/HYSTEROSCOPY WITH MYOSURE N/A 04/13/2015   Procedure: DILATATION & CURETTAGE/HYSTEROSCOPY WITH MYOSURE/POLYPECTOMY;  Surgeon: Elenora Fenderhelsea C Ward, MD;  Location: [facility] ORS;  Service: Gynecology;  Laterality: N/A;   DILATATION & CURETTAGE/HYSTEROSCOPY WITH MYOSURE N/A 06/01/2016   Procedure: DILATATION & CURETTAGE/HYSTEROSCOPY WITH MYOSURE;  Surgeon: Vena AustriaAndreas Staebler, MD;  Location: [facility] ORS;  Service: Gynecology;  Laterality: N/A;   GUM SURGERY     laproscopy  2007   POLYPECTOMY     endometrial   Patient Active Problem List   Diagnosis Date Noted   Dysuria 08/20/2020   Neurogenic bladder 08/20/2020   Pelvic pain in female 12/25/2019   Chronic low back pain 12/25/2019   Chronic venous insufficiency 12/24/2019   Lymphedema 12/24/2019   Lower extremity edema 12/03/2019   HLD (hyperlipidemia) 10/10/2019   Low back pain radiating to left leg 10/04/2019   Dyspnea on exertion  09/20/2019   Orthopnea 09/20/2019   Chronic abdominal pain 05/14/2019   Irritable bowel syndrome with constipation 05/14/2019   Chronic constipation 12/25/2018   Urinary retention 08/12/2018   Anxiety 08/12/2018   Elevated blood pressure reading without diagnosis of hypertension 07/12/2018   Left leg pain 07/12/2018   Acute left-sided low back pain 06/03/2018   Cloudy urine 04/29/2018   Chest pain 04/29/2018   Frontal headache 04/27/2018   Localized, primary osteoarthritis 02/07/2018   Pelvic pain 01/30/2018   Slow transit constipation 10/10/2017   Bilateral impacted cerumen 11/28/2016   Chronic bilateral low back pain without sciatica 11/28/2016   Precordial pain 07/07/2016   Right bundle branch block (RBBB) on electrocardiogram (ECG) 07/07/2016   Chest tightness 06/27/2016    Preventative health care 09/30/2015   Encounter to establish care 09/28/2014   Vestibular migraine 09/28/2014   Encounter for screening colonoscopy 11/21/2013      Prior to Admission medications   Medication Sig Start Date End Date Taking? Authorizing Provider  cephALEXin (KEFLEX) 500 MG capsule Take 1 capsule (500 mg total) by mouth 3 (three) times daily for 7 days. 10/04/20 10/11/20 Yes Willy Eddy, MD  dicyclomine (BENTYL) 10 MG capsule Take 1 capsule (10 mg total) by mouth 3 (three) times daily as needed for up to 14 days for spasms. 10/04/20 10/18/20 Yes Willy Eddy, MD  ondansetron (ZOFRAN) 4 MG tablet Take 1 tablet (4 mg total) by mouth daily as needed. 10/04/20 10/04/21 Yes Willy Eddy, MD  Azelastine HCl 137 MCG/SPRAY SOLN SMARTSIG:2 Spray(s) Both Nares Twice Daily PRN 07/21/19   [provider]  Calcium Carbonate-Vit D-Min (CALCIUM 1200 PO) Take 2-3 tablets by mouth daily. Reported on 05/26/2015    [provider]  famotidine (PEPCID) 20 MG tablet Take 1 tablet (20 mg total) by mouth 2 (two) times daily. 12/03/19   End, Cristal Deer, MD  meloxicam (MOBIC) 7.5 MG tablet Take by mouth. 01/13/20 01/12/21  [provider]  mometasone (NASONEX) 50 MCG/ACT nasal spray 2 sprays daily. 06/26/20   [provider]  nitrofurantoin, macrocrystal-monohydrate, (MACROBID) 100 MG capsule Take 1 capsule (100 mg total) by mouth 2 (two) times daily. Take with food. 08/20/20   [name], FNP  nystatin-triamcinolone ointment (MYCOLOG) Apply topically. 01/13/20 01/12/21  [provider]  polyethylene glycol (MIRALAX / GLYCOLAX) 17 g packet Take 17 g by mouth daily.    [provider]  pregabalin (LYRICA) 100 MG capsule Take 100 mg by mouth 2 (two) times daily.    [provider]  psyllium (REGULOID) 0.52 g capsule Take 0.52 g by mouth daily.    [provider]    Allergies Lac bovis, Milk-related compounds, Omeprazole, and  Penicillins    Social History Social History   Tobacco Use   Smoking status: Never   Smokeless tobacco: Never  Vaping Use   Vaping Use: Never used  Substance Use Topics   Alcohol use: No   Drug use: No    Review of Systems Patient denies headaches, rhinorrhea, blurry vision, numbness, shortness of breath, chest pain, edema, cough, abdominal pain, nausea, vomiting, diarrhea, dysuria, fevers, rashes or hallucinations unless otherwise stated above in HPI. ____________________________________________   PHYSICAL EXAM:  VITAL SIGNS: Vitals:   10/04/20 0818 10/04/20 1000  BP: 140/78 (!) 142/80  Pulse: 61 (!) 59  Resp: 14   Temp: 98.1 F (36.7 C)   SpO2: 100% 100%    Constitutional: Alert and oriented.  Eyes: Conjunctivae are normal.  Head: Atraumatic.  Nose: No congestion/rhinnorhea. Mouth/Throat: Mucous membranes are moist.   Neck: No stridor. Painless ROM.  Cardiovascular: Normal rate, regular rhythm. Grossly normal heart sounds.  Good peripheral circulation. Respiratory: Normal respiratory effort.  No retractions. Lungs CTAB. Gastrointestinal: Soft and nontender. No distention. No abdominal bruits. No CVA tenderness. Genitourinary:  Musculoskeletal: No lower extremity tenderness nor edema.  No joint effusions. Neurologic:  Normal speech and language. No gross focal neurologic deficits are appreciated. No facial droop Skin:  Skin is warm, dry and intact. No rash noted. Psychiatric: Mood and affect are normal. Speech and behavior are normal.  ____________________________________________   LABS (all labs ordered are listed, but only abnormal results are displayed)  Results for orders placed or performed during the hospital encounter of 10/04/20 (from the past 24 hour(s))  Lipase, blood     Status: Abnormal   Collection Time: 10/04/20  5:06 AM  Result Value Ref Range   Lipase 97 (H) 11 - 51 U/L  Comprehensive metabolic panel     Status: Abnormal   Collection Time:  10/04/20  5:06 AM  Result Value Ref Range   Sodium 139 135 - 145 mmol/L   Potassium 4.1 3.5 - 5.1 mmol/L   Chloride 105 98 - 111 mmol/L   CO2 26 22 - 32 mmol/L   Glucose, Bld 114 (H) 70 - 99 mg/dL   BUN 18 6 - 20 mg/dL   Creatinine, Ser 3.74 0.44 - 1.00 mg/dL   Calcium 9.2 8.9 - 82.7 mg/dL   Total Protein 7.6 6.5 - 8.1 g/dL   Albumin 4.4 3.5 - 5.0 g/dL   AST 27 15 - 41 U/L   ALT 25 0 - 44 U/L   Alkaline Phosphatase 49 38 - 126 U/L   Total Bilirubin 0.9 0.3 - 1.2 mg/dL   GFR, Estimated >07 >86 mL/min   Anion gap 8 5 - 15  CBC     Status: None   Collection Time: 10/04/20  5:06 AM  Result Value Ref Range   WBC 6.9 4.0 - 10.5 K/uL   RBC 4.63 3.87 - 5.11 MIL/uL   Hemoglobin 14.0 12.0 - 15.0 g/dL   HCT 75.4 49.2 - 01.0 %   MCV 87.0 80.0 - 100.0 fL   MCH 30.2 26.0 - 34.0 pg   MCHC 34.7 30.0 - 36.0 g/dL   RDW 07.1 21.9 - 75.8 %   Platelets 200 150 - 400 K/uL   nRBC 0.0 0.0 - 0.2 %  Urinalysis, Complete w Microscopic     Status: Abnormal   Collection Time: 10/04/20  9:10 AM  Result Value Ref Range   Color, Urine YELLOW (A) YELLOW   APPearance HAZY (A) CLEAR   Specific Gravity, Urine 1.023 1.005 - 1.030   pH 5.0 5.0 - 8.0   Glucose, UA NEGATIVE NEGATIVE mg/dL   Hgb urine dipstick SMALL (A) NEGATIVE   Bilirubin Urine NEGATIVE NEGATIVE   Ketones, ur 20 (A) NEGATIVE mg/dL   Protein, ur NEGATIVE NEGATIVE mg/dL   Nitrite NEGATIVE NEGATIVE   Leukocytes,Ua LARGE (A) NEGATIVE   RBC / HPF 11-20 0 - 5 RBC/hpf   WBC, UA >50 (H) 0 - 5 WBC/hpf   Bacteria, UA RARE (A) NONE SEEN   Squamous Epithelial / LPF 0-5 0 - 5   Mucus PRESENT    ____________________________________________ ____________________________________________  RADIOLOGY  I personally reviewed all radiographic images ordered to evaluate for the above acute complaints and reviewed radiology reports and findings.  These findings were personally discussed  with the patient.  Please see medical record for radiology  report.  ____________________________________________   PROCEDURES  Procedure(s) performed:  Procedures    Critical Care performed: no ____________________________________________   INITIAL IMPRESSION / ASSESSMENT AND PLAN / ED COURSE  Pertinent labs & imaging results that were available during my care of the patient were reviewed by me and considered in my medical decision making (see chart for details).   DDX: Enteritis, gastritis, diverticulitis, colitis, SBO, constipation, biliary pathology, pancreatitis, mass  Cristina Wade is a 50 y.o. who presents to the ED with presentation as described above.  Patient nontoxic-appearing able to ambulate no peritonitis or guarding on exam no white count mildly elevated lipase does appear dehydrated will give IV fluids.  Will order CT imaging, check urine.  Clinical Course as of 10/04/20 1037  Mon Oct 04, 2020  2956 CT imaging without evidence of acute abnormality explain the patient's discomfort.  Urinalysis does show pyuria large leukocytes rare bacteria.  States that she is having some increased urinary frequency and discomfort symptoms may be secondary to UTI.  Does not seem consistent with acute pancreatitis.  Is not have any right upper quadrant tenderness and her LFTs and bilirubin are normal lower suspicion for biliary pathology. [PR]  1032 Tolerating p.o.  Feels improved after IV hydration.  I do believe she stable appropriate for outpatient follow-up.  Discussed strict return precautions. [PR]    Clinical Course User Index [PR] Willy Eddy, MD    The patient was evaluated in Emergency Department today for the symptoms described in the history of present illness. He/she was evaluated in the context of the global COVID-19 pandemic, which necessitated consideration that the patient might be at risk for infection with the SARS-CoV-2 virus that causes COVID-19. Institutional protocols and algorithms that pertain to the evaluation of  patients at risk for COVID-19 are in a state of rapid change based on information released by regulatory bodies including the CDC and federal and state organizations. These policies and algorithms were followed during the patient's care in the ED.  As part of my medical decision making, I reviewed the following data within the electronic MEDICAL RECORD NUMBER Nursing notes reviewed and incorporated, Labs reviewed, notes from prior ED visits and [place] Controlled Substance Database   ____________________________________________   FINAL CLINICAL IMPRESSION(S) / ED DIAGNOSES  Final diagnoses:  Generalized abdominal pain      NEW MEDICATIONS STARTED DURING THIS VISIT:  New Prescriptions   CEPHALEXIN (KEFLEX) 500 MG CAPSULE    Take 1 capsule (500 mg total) by mouth 3 (three) times daily for 7 days.   DICYCLOMINE (BENTYL) 10 MG CAPSULE    Take 1 capsule (10 mg total) by mouth 3 (three) times daily as needed for up to 14 days for spasms.   ONDANSETRON (ZOFRAN) 4 MG TABLET    Take 1 tablet (4 mg total) by mouth daily as needed.     Note:  This document was prepared using Dragon voice recognition software and may include unintentional dictation errors.    Willy Eddy, MD 10/04/20 1037

## 2020-10-04 NOTE — Telephone Encounter (Signed)
Pt LM on triage line stating that she had imaging done over the weekend in the ER (CT scan) and now questions if she still needs to have bladder/renal scan done as well. Please advise.

## 2020-10-04 NOTE — Discharge Instructions (Signed)

## 2020-10-04 NOTE — Telephone Encounter (Signed)
Patient informed, voiced understanding.  °

## 2020-10-04 NOTE — ED Notes (Signed)
Pt tolerated fluids and crackers well.  

## 2020-10-04 NOTE — Telephone Encounter (Signed)
No, this is reviewed and she does not need to have renal ultrasound.  Vanna Scotland, MD

## 2020-10-04 NOTE — ED Triage Notes (Signed)
Pt comes into the ED via POV c/o generalized abdominal pain.  Pt states she hasnt had a full BM since last Friday and she started having vomiting on Saturday.  Pt ambulatory to triage at this time and in NAD.

## 2020-10-06 ENCOUNTER — Ambulatory Visit: Payer: BC Managed Care – PPO

## 2020-10-06 ENCOUNTER — Other Ambulatory Visit: Payer: Self-pay | Admitting: Family

## 2020-10-06 DIAGNOSIS — F419 Anxiety disorder, unspecified: Secondary | ICD-10-CM

## 2020-10-11 ENCOUNTER — Ambulatory Visit: Payer: BC Managed Care – PPO | Admitting: Physician Assistant

## 2020-10-11 ENCOUNTER — Telehealth: Payer: Self-pay | Admitting: Physician Assistant

## 2020-10-11 NOTE — Telephone Encounter (Signed)
Patient called the office today with a complaint of possible UTI.  Appointment added for this afternoon with PA.

## 2020-10-11 NOTE — Telephone Encounter (Signed)
Patient called back and cancelled appt.  She was seen in the ER on 6/20 and put on Keflex.  She will complete the antibiotics and follow up after if she is still having symptoms.

## 2020-10-14 ENCOUNTER — Ambulatory Visit: Payer: Self-pay | Admitting: Physician Assistant

## 2020-10-14 ENCOUNTER — Other Ambulatory Visit: Payer: BC Managed Care – PPO

## 2020-10-21 ENCOUNTER — Telehealth: Payer: Self-pay

## 2020-10-21 NOTE — Telephone Encounter (Signed)
We can prescribe a 12 fr.

## 2020-10-21 NOTE — Telephone Encounter (Signed)
Pt LM on triage line stating that she needs a new catheter supply order. She questions if new order can be for a smaller catheter. She is currently using a 14FR. Please advise.

## 2020-10-21 NOTE — Telephone Encounter (Signed)
Form faxed to [employer]

## 2020-10-22 ENCOUNTER — Ambulatory Visit: Payer: Self-pay | Admitting: Urology

## 2020-10-25 NOTE — Progress Notes (Deleted)
10/26/2020 2:25 PM   Cristina Wade Oct 23, 1970 [id number]  Referring provider: [name], FNP [address] [place],  [place] [address]  Urological history: 1. High risk hematuria -non-smoker -work up 2015 -bilateral punctate stones -cysto 2019 NED -no reports of gross hematuria -UA ***  2. Neurogenic bladder -UDS 2017 indicating nonobstructive urinary retention with poor bladder sensation and external sphincter dyssynergia -completed 12 weeks of PTNS -completed pelvic floor PT -manages with self cathing once daily  3. Nephrolithiasis -non-contrast CT 2022 2 mm stone in the right kidney lower pole without hydronephrosis.  Three tiny stones in left kidney lower pole region without hydronephrosis  No chief complaint on file.   HPI: Cristina Wade is a 50 y.o. female who presents today for 3 month follow up     PMH: Past Medical History:  Diagnosis Date   Asthma    Chronic pelvic pain in female 2018   Cystitis    Cystocele    Endometrial polyp    Endometriosis    per pt report but never seen during surg   Frequent headaches    Gastritis    GERD (gastroesophageal reflux disease)    rare   Gross hematuria    History of kidney stones    Microscopic hematuria    Migraine    Migraines    Neuropathy    Osteopenia    Urinary disorder    UTI (lower urinary tract infection)     Surgical History: Past Surgical History:  Procedure Laterality Date   COLONOSCOPY N/A 11/05/2014   Procedure: COLONOSCOPY;  Surgeon: Scot Jun, MD;  Location: [facility] ENDOSCOPY;  Service: Endoscopy;  Laterality: N/A;   COLONOSCOPY  11/2016   Dr. Mechele Collin   CYSTOSCOPY  2007   with biopsy    CYSTOSCOPY N/A 06/01/2016   Procedure: CYSTOSCOPY;  Surgeon: Vena Austria, MD;  Location: [facility] ORS;  Service: Gynecology;  Laterality: N/A;   DIAGNOSTIC LAPAROSCOPY     DILATATION & CURETTAGE/HYSTEROSCOPY WITH MYOSURE N/A 04/13/2015   Procedure: DILATATION &  CURETTAGE/HYSTEROSCOPY WITH MYOSURE/POLYPECTOMY;  Surgeon: Elenora Fender Ward, MD;  Location: [facility] ORS;  Service: Gynecology;  Laterality: N/A;   DILATATION & CURETTAGE/HYSTEROSCOPY WITH MYOSURE N/A 06/01/2016   Procedure: DILATATION & CURETTAGE/HYSTEROSCOPY WITH MYOSURE;  Surgeon: Vena Austria, MD;  Location: [facility] ORS;  Service: Gynecology;  Laterality: N/A;   GUM SURGERY     laproscopy  2007   POLYPECTOMY     endometrial    Home Medications:  Allergies as of 10/26/2020       Reactions   Lac Bovis Other (See Comments)   Milk-related Compounds    Omeprazole Other (See Comments)   Pt reports "Chest pain, HA and Dysuria."   Penicillins Itching, Rash        Medication List        Accurate as of October 25, 2020  2:25 PM. If you have any questions, ask your nurse or doctor.          Azelastine HCl 137 MCG/SPRAY Soln SMARTSIG:2 Spray(s) Both Nares Twice Daily PRN   CALCIUM 1200 PO Take 2-3 tablets by mouth daily. Reported on 05/26/2015   dicyclomine 10 MG capsule Commonly known as: Bentyl Take 1 capsule (10 mg total) by mouth 3 (three) times daily as needed for up to 14 days for spasms.   famotidine 20 MG tablet Commonly known as: PEPCID Take 1 tablet (20 mg total) by mouth 2 (two) times daily.   meloxicam 7.5 MG  tablet Commonly known as: MOBIC Take by mouth.   mometasone 50 MCG/ACT nasal spray Commonly known as: NASONEX 2 sprays daily.   nitrofurantoin (macrocrystal-monohydrate) 100 MG capsule Commonly known as: Macrobid Take 1 capsule (100 mg total) by mouth 2 (two) times daily. Take with food.   nystatin-triamcinolone ointment Commonly known as: MYCOLOG Apply topically.   ondansetron 4 MG tablet Commonly known as: Zofran Take 1 tablet (4 mg total) by mouth daily as needed.   polyethylene glycol 17 g packet Commonly known as: MIRALAX / GLYCOLAX Take 17 g by mouth daily.   pregabalin 100 MG capsule Commonly known as: LYRICA Take 100 mg by mouth 2 (two)  times daily.   psyllium 0.52 g capsule Commonly known as: REGULOID Take 0.52 g by mouth daily.        Allergies:  Allergies  Allergen Reactions   Lac Bovis Other (See Comments)   Milk-Related Compounds    Omeprazole Other (See Comments)    Pt reports "Chest pain, HA and Dysuria."   Penicillins Itching and Rash    Family History: Family History  Problem Relation Age of Onset   Colon cancer Father 56   Arthritis Father    Hyperlipidemia Father    Transient ischemic attack Father    Cancer - Colon Father 2   Prostate cancer Father 86   Valvular heart disease Father    Arthritis Mother    Heart disease Mother    Stroke Mother        TIA   Hypertension Mother    Diabetes Mother    Kidney cancer Mother 34   Heart failure Mother    Hyperlipidemia Mother    Cancer Maternal Grandmother    Cancer Paternal Grandfather        prostate   Breast cancer Cousin 26       has contact    Social History:  reports that she has never smoked. She has never used smokeless tobacco. She reports that she does not drink alcohol and does not use drugs.  ROS: Pertinent ROS in HPI  Physical Exam: There were no vitals taken for this visit.  Constitutional:  Well nourished. Alert and oriented, No acute distress. HEENT: [place] AT, moist mucus membranes.  Trachea midline, no masses. Cardiovascular: No clubbing, cyanosis, or edema. Respiratory: Normal respiratory effort, no increased work of breathing. GI: Abdomen is soft, non tender, non distended, no abdominal masses. Liver and spleen not palpable.  No hernias appreciated.  Stool sample for occult testing is not indicated.   GU: No CVA tenderness.  No bladder fullness or masses.  *** external genitalia, *** pubic hair distribution, no lesions.  Normal urethral meatus, no lesions, no prolapse, no discharge.   No urethral masses, tenderness and/or tenderness. No bladder fullness, tenderness or masses. *** vagina mucosa, *** estrogen effect, no  discharge, no lesions, *** pelvic support, *** cystocele and *** rectocele noted.  No cervical motion tenderness.  Uterus is freely mobile and non-fixed.  No adnexal/parametria masses or tenderness noted.  Anus and perineum are without rashes or lesions.   ***  Skin: No rashes, bruises or suspicious lesions. Lymph: No cervical or inguinal adenopathy. Neurologic: Grossly intact, no focal deficits, moving all 4 extremities. Psychiatric: Normal mood and affect.    Laboratory Data: Lab Results  Component Value Date   WBC 6.9 10/04/2020   HGB 14.0 10/04/2020   HCT 40.3 10/04/2020   MCV 87.0 10/04/2020   PLT 200 10/04/2020    Lab  Results  Component Value Date   CREATININE 0.82 10/04/2020   Lab Results  Component Value Date   AST 27 10/04/2020   Lab Results  Component Value Date   ALT 25 10/04/2020    Urinalysis    Component Value Date/Time   COLORURINE YELLOW (A) 10/04/2020 0910   APPEARANCEUR HAZY (A) 10/04/2020 0910   APPEARANCEUR Cloudy (A) 09/25/2019 1433   LABSPEC 1.023 10/04/2020 0910   PHURINE 5.0 10/04/2020 0910   GLUCOSEU NEGATIVE 10/04/2020 0910   GLUCOSEU NEGATIVE 06/03/2018 1637   HGBUR SMALL (A) 10/04/2020 0910   BILIRUBINUR NEGATIVE 10/04/2020 0910   BILIRUBINUR neg 10/22/2019 1546   BILIRUBINUR Negative 09/25/2019 1433   KETONESUR 20 (A) 10/04/2020 0910   PROTEINUR NEGATIVE 10/04/2020 0910   UROBILINOGEN 0.2 06/13/2018 1458   UROBILINOGEN 0.2 06/03/2018 1637   NITRITE NEGATIVE 10/04/2020 0910   LEUKOCYTESUR LARGE (A) 10/04/2020 0910    I have reviewed the labs.   Pertinent Imaging: @CT @ @ultrasound @ @KUB @ I have independently reviewed the films.    Assessment & Plan:  ***  1. Neurogenic bladder -completed PTNS and PT -managed with self cathing daily  2. High risk hematuria -work up x 2 NED, most recent imaging 2022 -no reports of gross hematuria  No follow-ups on file.  These notes generated with voice recognition software. I apologize  for typographical errors.  , PA-C  [facility] [address]  [address] [place], [facility] [address] [phone]

## 2020-10-26 ENCOUNTER — Ambulatory Visit: Payer: Self-pay | Admitting: Urology

## 2020-11-10 ENCOUNTER — Other Ambulatory Visit: Payer: Self-pay

## 2020-11-10 ENCOUNTER — Other Ambulatory Visit (INDEPENDENT_AMBULATORY_CARE_PROVIDER_SITE_OTHER): Payer: BC Managed Care – PPO

## 2020-11-10 ENCOUNTER — Other Ambulatory Visit: Payer: Self-pay | Admitting: Family

## 2020-11-10 ENCOUNTER — Encounter: Payer: BC Managed Care – PPO | Admitting: Family

## 2020-11-10 DIAGNOSIS — Z0000 Encounter for general adult medical examination without abnormal findings: Secondary | ICD-10-CM

## 2020-11-10 LAB — TSH: TSH: 1.95 u[IU]/mL (ref 0.35–5.50)

## 2020-11-10 LAB — COMPREHENSIVE METABOLIC PANEL
ALT: 18 U/L (ref 0–35)
AST: 19 U/L (ref 0–37)
Albumin: 4.5 g/dL (ref 3.5–5.2)
Alkaline Phosphatase: 55 U/L (ref 39–117)
BUN: 10 mg/dL (ref 6–23)
CO2: 30 mEq/L (ref 19–32)
Calcium: 9.6 mg/dL (ref 8.4–10.5)
Chloride: 105 mEq/L (ref 96–112)
Creatinine, Ser: 0.69 mg/dL (ref 0.40–1.20)
GFR: 99.94 mL/min (ref >60.00)
Glucose, Bld: 81 mg/dL (ref 70–99)
Potassium: 4.2 mEq/L (ref 3.5–5.1)
Sodium: 142 mEq/L (ref 135–145)
Total Bilirubin: 0.8 mg/dL (ref 0.2–1.2)
Total Protein: 7.1 g/dL (ref 6.0–8.3)

## 2020-11-10 LAB — CBC WITH DIFFERENTIAL/PLATELET
Basophils Absolute: 0 10*3/uL (ref 0.0–0.1)
Basophils Relative: 0.5 % (ref 0.0–3.0)
Eosinophils Absolute: 0.1 10*3/uL (ref 0.0–0.7)
Eosinophils Relative: 1.8 % (ref 0.0–5.0)
HCT: 39.8 % (ref 36.0–46.0)
Hemoglobin: 13.2 g/dL (ref 12.0–15.0)
Lymphocytes Relative: 33.3 % (ref 12.0–46.0)
Lymphs Abs: 1.2 10*3/uL (ref 0.7–4.0)
MCHC: 33.1 g/dL (ref 30.0–36.0)
MCV: 90.6 fl (ref 78.0–100.0)
Monocytes Absolute: 0.3 10*3/uL (ref 0.1–1.0)
Monocytes Relative: 8.6 % (ref 3.0–12.0)
Neutro Abs: 2.1 10*3/uL (ref 1.4–7.7)
Neutrophils Relative %: 55.8 % (ref 43.0–77.0)
Platelets: 179 10*3/uL (ref 150.0–400.0)
RBC: 4.4 Mil/uL (ref 3.87–5.11)
RDW: 13.3 % (ref 11.5–15.5)
WBC: 3.7 10*3/uL — ABNORMAL LOW (ref 4.0–10.5)

## 2020-11-10 LAB — LIPID PANEL
Cholesterol: 173 mg/dL (ref 0–200)
HDL: 60.2 mg/dL (ref >39.00)
LDL Cholesterol: 98 mg/dL (ref 0–99)
NonHDL: 113.04
Total CHOL/HDL Ratio: 3
Triglycerides: 73 mg/dL (ref 0.0–149.0)
VLDL: 14.6 mg/dL (ref 0.0–40.0)

## 2020-11-10 LAB — HEMOGLOBIN A1C: Hgb A1c MFr Bld: 5.7 % (ref 4.6–6.5)

## 2020-11-10 LAB — VITAMIN D 25 HYDROXY (VIT D DEFICIENCY, FRACTURES): VITD: 37.78 ng/mL (ref 30.00–100.00)

## 2020-11-10 NOTE — Progress Notes (Signed)
Subjective:    Patient ID: Cristina Wade, female    DOB: 1971-01-23, 50 y.o.   MRN: [id number]  CC: Cristina Wade is a 50 y.o. female who presents today for physical exam.    HPI: Accompanied by twin sister today   Colorectal Cancer Screening: Due, performed in 2016, Dr. Mechele Collin.    She is following with Dr. Henrene Hawking gastroenterology for irritable bowel syndrome with constipation.  Per note 11/03/2020, can consider earlier colonoscopy due in 2023 per his note.    Repeat in 5 years per original note, Dr Markham Jordan.   Breast Cancer Screening: Mammogram overdue Cervical Cancer Screening: due 02/2021; she is established with GYN, has upcoming appointment in December of this year with Dr. Dalbert Garnet. Bone Health screening/DEXA for 65+: No increased fracture risk. Defer screening at this time.  Lung Cancer Screening: Doesn't have 20 year pack year history and age > 18 years yo 64 years         Tetanus - UTD         Labs: Screening labs done prior Exercise: No regular exercise.  She is starting to use arm weights at home.  Alcohol use:  none Smoking/tobacco use: Nonsmoker.     HISTORY:  Past Medical History:  Diagnosis Date   Asthma    Chronic pelvic pain in female 2018   Cystitis    Cystocele    Endometrial polyp    Endometriosis    per pt report but never seen during surg   Frequent headaches    Gastritis    GERD (gastroesophageal reflux disease)    rare   Gross hematuria    History of kidney stones    Microscopic hematuria    Migraine    Migraines    Neuropathy    Osteopenia    Urinary disorder    UTI (lower urinary tract infection)     Past Surgical History:  Procedure Laterality Date   COLONOSCOPY N/A 11/05/2014   Procedure: COLONOSCOPY;  Surgeon: Scot Jun, MD;  Location: [facility] ENDOSCOPY;  Service: Endoscopy;  Laterality: N/A;   COLONOSCOPY  11/2016   Dr. Mechele Collin   CYSTOSCOPY  2007   with biopsy    CYSTOSCOPY N/A 06/01/2016   Procedure: CYSTOSCOPY;   Surgeon: Vena Austria, MD;  Location: [facility] ORS;  Service: Gynecology;  Laterality: N/A;   DIAGNOSTIC LAPAROSCOPY     DILATATION & CURETTAGE/HYSTEROSCOPY WITH MYOSURE N/A 04/13/2015   Procedure: DILATATION & CURETTAGE/HYSTEROSCOPY WITH MYOSURE/POLYPECTOMY;  Surgeon: Elenora Fender Ward, MD;  Location: [facility] ORS;  Service: Gynecology;  Laterality: N/A;   DILATATION & CURETTAGE/HYSTEROSCOPY WITH MYOSURE N/A 06/01/2016   Procedure: DILATATION & CURETTAGE/HYSTEROSCOPY WITH MYOSURE;  Surgeon: Vena Austria, MD;  Location: [facility] ORS;  Service: Gynecology;  Laterality: N/A;   GUM SURGERY     laproscopy  2007   POLYPECTOMY     endometrial   Family History  Problem Relation Age of Onset   Colon cancer Father 79   Arthritis Father    Hyperlipidemia Father    Transient ischemic attack Father    Cancer - Colon Father 66   Prostate cancer Father 90   Valvular heart disease Father    Arthritis Mother    Heart disease Mother    Stroke Mother        TIA   Hypertension Mother    Diabetes Mother    Kidney cancer Mother 61   Heart failure Mother    Hyperlipidemia Mother    Cancer Maternal Grandmother  Cancer Paternal Grandfather        prostate   Breast cancer Cousin 52       has contact      ALLERGIES: Lac bovis, Milk-related compounds, Omeprazole, and Penicillins  Current Outpatient Medications on File Prior to Visit  Medication Sig Dispense Refill   Azelastine HCl 137 MCG/SPRAY SOLN SMARTSIG:2 Spray(s) Both Nares Twice Daily PRN     Calcium Carbonate-Vit D-Min (CALCIUM 1200 PO) Take 2-3 tablets by mouth daily. Reported on 05/26/2015     meloxicam (MOBIC) 7.5 MG tablet Take by mouth.     mometasone (NASONEX) 50 MCG/ACT nasal spray 2 sprays daily.     polyethylene glycol (MIRALAX / GLYCOLAX) 17 g packet Take 17 g by mouth daily.     pregabalin (LYRICA) 100 MG capsule Take 100 mg by mouth 2 (two) times daily.     psyllium (REGULOID) 0.52 g capsule Take 0.52 g by mouth daily.     No  current facility-administered medications on file prior to visit.    Social History   Tobacco Use   Smoking status: Never   Smokeless tobacco: Never  Vaping Use   Vaping Use: Never used  Substance Use Topics   Alcohol use: No   Drug use: No    Review of Systems  Constitutional:  Negative for chills, fever and unexpected weight change.  HENT:  Negative for congestion.   Respiratory:  Negative for cough.   Cardiovascular:  Negative for chest pain, palpitations and leg swelling.  Gastrointestinal:  Negative for nausea and vomiting.  Musculoskeletal:  Negative for arthralgias and myalgias.  Skin:  Negative for rash.  Neurological:  Negative for headaches.  Hematological:  Negative for adenopathy.  Psychiatric/Behavioral:  Negative for confusion.      Objective:    BP 110/70 (BP Location: Left Arm, Patient Position: Sitting, Cuff Size: Large)   Pulse 62   Temp 98.3 F (36.8 C) (Oral)   Ht 5\' 10"  (1.778 m)   Wt 142 lb (64.4 kg)   SpO2 99%   BMI 20.37 kg/m   BP Readings from Last 3 Encounters:  11/12/20 110/70  10/04/20 (!) 136/91  09/22/20 114/77   Wt Readings from Last 3 Encounters:  11/12/20 142 lb (64.4 kg)  10/04/20 140 lb (63.5 kg)  09/22/20 144 lb (65.3 kg)    Physical Exam Vitals reviewed.  Constitutional:      Appearance: Normal appearance. She is well-developed.  Eyes:     Conjunctiva/sclera: Conjunctivae normal.  Neck:     Thyroid: No thyroid mass or thyromegaly.  Cardiovascular:     Rate and Rhythm: Normal rate and regular rhythm.     Pulses: Normal pulses.     Heart sounds: Normal heart sounds.  Pulmonary:     Effort: Pulmonary effort is normal.     Breath sounds: Normal breath sounds. No wheezing, rhonchi or rales.  Abdominal:     General: Bowel sounds are normal. There is no distension.     Palpations: Abdomen is soft. Abdomen is not rigid. There is no fluid wave or mass.     Tenderness: There is no abdominal tenderness. There is no guarding  or rebound.  Lymphadenopathy:     Head:     Right side of head: No submental, submandibular, tonsillar, preauricular, posterior auricular or occipital adenopathy.     Left side of head: No submental, submandibular, tonsillar, preauricular, posterior auricular or occipital adenopathy.     Cervical: No cervical adenopathy.  Skin:  General: Skin is warm and dry.  Neurological:     Mental Status: She is alert.  Psychiatric:        Speech: Speech normal.        Behavior: Behavior normal.        Thought Content: Thought content normal.       Assessment & Plan:   Problem List Items Addressed This Visit       Other   Routine physical examination - Primary    Advised to continue close follow-up with gastroenterology, Dr. Mia Creek as it relates to constipation and colonoscopy being due.  Per his note we will do sooner than 2023 Constipation does not improve. Repeat CBC in 6 weeks and reviewed labs with her today from 11/10/20. Declines CBE and pelvic exam as she is following Dr Dalbert Garnet.Declines mammogram she would like to have this ordered by GYN.  Encouraged starting regular exercise program.        Relevant Orders   CBC with Differential/Platelet     I have discontinued Carmyn Sparkman's famotidine, nystatin-triamcinolone ointment, nitrofurantoin (macrocrystal-monohydrate), ondansetron, and dicyclomine. I am also having her maintain her Calcium Carbonate-Vit D-Min (CALCIUM 1200 PO), Azelastine HCl, pregabalin, psyllium, polyethylene glycol, meloxicam, and mometasone.   No orders of the defined types were placed in this encounter.   Return precautions given.   Risks, benefits, and alternatives of the medications and treatment plan prescribed today were discussed, and patient expressed understanding.   Education regarding symptom management and diagnosis given to patient on AVS.   Continue to follow with [name], FNP for routine health maintenance.   [name] and I  agreed with plan.   Rennie Plowman, FNP

## 2020-11-11 LAB — HEPATITIS C ANTIBODY
Hepatitis C Ab: NONREACTIVE
SIGNAL TO CUT-OFF: 0.33 (ref <1.00)

## 2020-11-11 LAB — HIV ANTIBODY (ROUTINE TESTING W REFLEX): HIV 1&2 Ab, 4th Generation: NONREACTIVE

## 2020-11-12 ENCOUNTER — Ambulatory Visit (INDEPENDENT_AMBULATORY_CARE_PROVIDER_SITE_OTHER): Payer: BC Managed Care – PPO | Admitting: Family

## 2020-11-12 ENCOUNTER — Encounter: Payer: Self-pay | Admitting: Family

## 2020-11-12 ENCOUNTER — Other Ambulatory Visit: Payer: Self-pay

## 2020-11-12 VITALS — BP 110/70 | HR 62 | Temp 98.3°F | Ht 70.0 in | Wt 142.0 lb

## 2020-11-12 DIAGNOSIS — Z0000 Encounter for general adult medical examination without abnormal findings: Secondary | ICD-10-CM | POA: Diagnosis not present

## 2020-11-12 NOTE — Patient Instructions (Addendum)
Lab in 4 weeks  Health Maintenance for Postmenopausal Women Menopause is a normal process in which your ability to get pregnant comes to an end. This process happens slowly over many months or years, usually between the ages of 8 and 17. Menopause is complete when you have missed your menstrualperiods for 12 months. It is important to talk with your health care provider about some of the most common conditions that affect women after menopause (postmenopausal women). These include heart disease, cancer, and bone loss (osteoporosis). Adopting a healthy lifestyle and getting preventive care can help to promote your health and wellness. The actions you take can also lower your chances ofdeveloping some of these common conditions. What should I know about menopause? During menopause, you may get a number of symptoms, such as: Hot flashes. These can be moderate or severe. Night sweats. Decrease in sex drive. Mood swings. Headaches. Tiredness. Irritability. Memory problems. Insomnia. Choosing to treat or not to treat these symptoms is a decision that you makewith your health care provider. Do I need hormone replacement therapy? Hormone replacement therapy is effective in treating symptoms that are caused by menopause, such as hot flashes and night sweats. Hormone replacement carries certain risks, especially as you become older. If you are thinking about using estrogen or estrogen with progestin, discuss the benefits and risks with your health care provider. What is my risk for heart disease and stroke? The risk of heart disease, heart attack, and stroke increases as you age. One of the causes may be a change in the body's hormones during menopause. This can affect how your body uses dietary fats, triglycerides, and cholesterol. Heart attack and stroke are medical emergencies. There are many things that you cando to help prevent heart disease and stroke. Watch your blood pressure High blood  pressure causes heart disease and increases the risk of stroke. This is more likely to develop in people who have high blood pressure readings, are of African descent, or are overweight. Have your blood pressure checked: Every 3-5 years if you are 47-40 years of age. Every year if you are 65 years old or older. Eat a healthy diet  Eat a diet that includes plenty of vegetables, fruits, low-fat dairy products, and lean protein. Do not eat a lot of foods that are high in solid fats, added sugars, or sodium.  Get regular exercise Get regular exercise. This is one of the most important things you can do for your health. Most adults should: Try to exercise for at least 150 minutes each week. The exercise should increase your heart rate and make you sweat (moderate-intensity exercise). Try to do strengthening exercises at least twice each week. Do these in addition to the moderate-intensity exercise. Spend less time sitting. Even light physical activity can be beneficial. Other tips Work with your health care provider to achieve or maintain a healthy weight. Do not use any products that contain nicotine or tobacco, such as cigarettes, e-cigarettes, and chewing tobacco. If you need help quitting, ask your health care provider. Know your numbers. Ask your health care provider to check your cholesterol and your blood sugar (glucose). Continue to have your blood tested as directed by your health care provider. Do I need screening for cancer? Depending on your health history and family history, you may need to have cancer screening at different stages of your life. This may include screening for: Breast cancer. Cervical cancer. Lung cancer. Colorectal cancer. What is my risk for osteoporosis? After menopause, you  may be at increased risk for osteoporosis. Osteoporosis is a condition in which bone destruction happens more quickly than new bone creation. To help prevent osteoporosis or the bone fractures  that can happen because of osteoporosis, you may take the following actions: If you are 81-36 years old, get at least 1,000 mg of calcium and at least 600 mg of vitamin D per day. If you are older than age 59 but younger than age 45, get at least 1,200 mg of calcium and at least 600 mg of vitamin D per day. If you are older than age 39, get at least 1,200 mg of calcium and at least 800 mg of vitamin D per day. Smoking and drinking excessive alcohol increase the risk of osteoporosis. Eat foods that are rich in calcium and vitamin D, and do weight-bearing exercisesseveral times each week as directed by your health care provider. How does menopause affect my mental health? Depression may occur at any age, but it is more common as you become older. Common symptoms of depression include: Low or sad mood. Changes in sleep patterns. Changes in appetite or eating patterns. Feeling an overall lack of motivation or enjoyment of activities that you previously enjoyed. Frequent crying spells. Talk with your health care provider if you think that you are experiencingdepression. General instructions See your health care provider for regular wellness exams and vaccines. This may include: Scheduling regular health, dental, and eye exams. Getting and maintaining your vaccines. These include: Influenza vaccine. Get this vaccine each year before the flu season begins. Pneumonia vaccine. Shingles vaccine. Tetanus, diphtheria, and pertussis (Tdap) booster vaccine. Your health care provider may also recommend other immunizations. Tell your health care provider if you have ever been abused or do not feel safeat home. Summary Menopause is a normal process in which your ability to get pregnant comes to an end. This condition causes hot flashes, night sweats, decreased interest in sex, mood swings, headaches, or lack of sleep. Treatment for this condition may include hormone replacement therapy. Take actions to  keep yourself healthy, including exercising regularly, eating a healthy diet, watching your weight, and checking your blood pressure and blood sugar levels. Get screened for cancer and depression. Make sure that you are up to date with all your vaccines. This information is not intended to replace advice given to you by your health care provider. Make sure you discuss any questions you have with your healthcare provider. Document Revised: 03/27/2018 Document Reviewed: 03/27/2018 Elsevier Patient Education  2022 [employer].

## 2020-11-12 NOTE — Assessment & Plan Note (Addendum)
Advised to continue close follow-up with gastroenterology, Dr. Mia Creek as it relates to constipation and colonoscopy being due.  Per his note we will do sooner than 2023 Constipation does not improve. Repeat CBC in 6 weeks and reviewed labs with her today from 11/10/20. Declines CBE and pelvic exam as she is following Dr Dalbert Garnet.Declines mammogram she would like to have this ordered by GYN.  Encouraged starting regular exercise program.

## 2020-11-15 NOTE — Telephone Encounter (Signed)
For your information  

## 2020-11-16 NOTE — Progress Notes (Signed)
11/17/2020 11:18 AM   Cristina Wade 07/17/1970 [id number]  Referring provider: [name], FNP [address] [place],  [place] [address]  Urological history: 1. High risk hematuria -non-smoker -work up 2015 -bilateral punctate stones -cysto 2019 NED -no reports of gross hematuria -UA neg for micro heme  2. Neurogenic bladder -UDS 2017 indicating nonobstructive urinary retention with poor bladder sensation and external sphincter dyssynergia -completed 12 weeks of PTNS -completed pelvic floor PT -manages with self cathing once daily  3. Nephrolithiasis -non-contrast CT 2022 2 mm stone in the right kidney lower pole without hydronephrosis.  Three tiny stones in left kidney lower pole region without hydronephrosis  Chief Complaint  Patient presents with   Follow-up     HPI: Cristina Wade is a 50 y.o. female who presents today for 3 month follow up with her sister, [name].  She was seen by her gynecologist in the beginning of July for dysuria and vaginal odor.  She had micro heme associated with a positive urine culture for Citrobacter freundii (A).    Today's UA neg for micro heme.  She states that she had episodes of gross heme x 2 a few weeks ago during spontaneous voids and that with cathing.  She is having suprapubic discomfort.  She is cathing twice daily.  She is getting 400 cc to 500 cc with her caths and she is voiding 200 cc three to four times daily on her own. She experiencing the suprapubic pain off and on during the day on a daily schedule.   It is difficult to tease out a more detailed history regarding the suprapubic discomfort as I feel it may be due to having a full bladder as she is drinking 10 to 12 cups of water daily and has only cathed once when she had the suprapubic pain.  She states the pain did abate an hour after she cath, but she was then pain for a few hours prior to cathing.  Patient denies any modifying or aggravating factors.   Patient denies any gross hematuria, dysuria or suprapubic/flank pain.  Patient denies any fevers, chills, nausea or vomiting.    She also states she was told that she has numerous stones in both of the upper parts of her kidneys.  I reviewed the CT scan from June with her and her sister and pointed out the small punctate stones that are located in the lower part of each kidney.  She queries whether or not she may have interstitial cystitis, but I explained to her that she does not have symptoms typical of IC.  Where IC is typically dysuria, urgency, voiding small amounts and suprapubic pain and she experiences large PVRs, rare dysuria, voiding large amounts and urinary urgency.  PMH: Past Medical History:  Diagnosis Date   Asthma    Chronic pelvic pain in female 2018   Cystitis    Cystocele    Endometrial polyp    Endometriosis    per pt report but never seen during surg   Frequent headaches    Gastritis    GERD (gastroesophageal reflux disease)    rare   Gross hematuria    History of kidney stones    Microscopic hematuria    Migraine    Migraines    Neuropathy    Osteopenia    Urinary disorder    UTI (lower urinary tract infection)     Surgical History: Past Surgical History:  Procedure Laterality Date   COLONOSCOPY  N/A 11/05/2014   Procedure: COLONOSCOPY;  Surgeon: Scot Junobert T Elliott, MD;  [facility];  Service: Endoscopy;  Laterality: N/A;   COLONOSCOPY  11/2016   Dr. Mechele CollinElliott   CYSTOSCOPY  2007   with biopsy    CYSTOSCOPY N/A 06/01/2016   Procedure: CYSTOSCOPY;  Surgeon: Vena AustriaAndreas Staebler, MD;  Location: [facility] ORS;  Service: Gynecology;  Laterality: N/A;   DIAGNOSTIC LAPAROSCOPY     DILATATION & CURETTAGE/HYSTEROSCOPY WITH MYOSURE N/A 04/13/2015   Procedure: DILATATION & CURETTAGE/HYSTEROSCOPY WITH MYOSURE/POLYPECTOMY;  Surgeon: Elenora Fenderhelsea C Ward, MD;  Location: [facility] ORS;  Service: Gynecology;  Laterality: N/A;   DILATATION & CURETTAGE/HYSTEROSCOPY WITH MYOSURE N/A  06/01/2016   Procedure: DILATATION & CURETTAGE/HYSTEROSCOPY WITH MYOSURE;  Surgeon: Vena AustriaAndreas Staebler, MD;  Location: [facility] ORS;  Service: Gynecology;  Laterality: N/A;   GUM SURGERY     laproscopy  2007   POLYPECTOMY     endometrial    Home Medications:  Allergies as of 11/17/2020       Reactions   Lac Bovis Other (See Comments)   Milk-related Compounds    Omeprazole Other (See Comments)   Pt reports "Chest pain, HA and Dysuria."   Penicillins Itching, Rash        Medication List        Accurate as of November 17, 2020 11:18 AM. If you have any questions, ask your nurse or doctor.          Azelastine HCl 137 MCG/SPRAY Soln SMARTSIG:2 Spray(s) Both Nares Twice Daily PRN   CALCIUM 1200 PO Take 2-3 tablets by mouth daily. Reported on 05/26/2015   meloxicam 7.5 MG tablet Commonly known as: MOBIC Take by mouth.   mometasone 50 MCG/ACT nasal spray Commonly known as: NASONEX 2 sprays daily.   polyethylene glycol 17 g packet Commonly known as: MIRALAX / GLYCOLAX Take 17 g by mouth daily.   pregabalin 100 MG capsule Commonly known as: LYRICA Take 100 mg by mouth 2 (two) times daily.   psyllium 0.52 g capsule Commonly known as: REGULOID Take 0.52 g by mouth daily.        Allergies:  Allergies  Allergen Reactions   Lac Bovis Other (See Comments)   Milk-Related Compounds    Omeprazole Other (See Comments)    Pt reports "Chest pain, HA and Dysuria."   Penicillins Itching and Rash    Family History: Family History  Problem Relation Age of Onset   Colon cancer Father [phone]   Arthritis Father    Hyperlipidemia Father    Transient ischemic attack Father    Cancer - Colon Father 755   Prostate cancer Father 1068   Valvular heart disease Father    Arthritis Mother    Heart disease Mother    Stroke Mother        TIA   Hypertension Mother    Diabetes Mother    Kidney cancer Mother 4745   Heart failure Mother    Hyperlipidemia Mother    Cancer Maternal Grandmother     Cancer Paternal Grandfather        prostate   Breast cancer Cousin [phone]       has contact    Social History:  reports that she has never smoked. She has never used smokeless tobacco. She reports that she does not drink alcohol and does not use drugs.  ROS: Pertinent ROS in HPI  Physical Exam: BP 123/77   Pulse 60   Ht 5\' 10"  (1.778 m)   Wt 142 lb (  64.4 kg)   BMI 20.37 kg/m   Constitutional:  Well nourished. Alert and oriented, No acute distress. HEENT: [place] AT, mask in place.  Trachea midline Cardiovascular: No clubbing, cyanosis, or edema. Respiratory: Normal respiratory effort, no increased work of breathing. Neurologic: Grossly intact, no focal deficits, moving all 4 extremities. Psychiatric: Normal mood and affect.    Laboratory Data: Lab Results  Component Value Date   WBC 3.7 (L) 11/10/2020   HGB 13.2 11/10/2020   HCT 39.8 11/10/2020   MCV 90.6 11/10/2020   PLT 179.0 11/10/2020    Lab Results  Component Value Date   CREATININE 0.69 11/10/2020   Lab Results  Component Value Date   AST 19 11/10/2020   Lab Results  Component Value Date   ALT 18 11/10/2020    Urinalysis Component     Latest Ref Rng & Units 11/17/2020  Specific Gravity, UA     1.005 - 1.030 1.010  pH, UA     5.0 - 7.5 7.0  Color, UA     Yellow Yellow  Appearance Ur     Clear Clear  Leukocytes,UA     Negative Trace (A)  Protein,UA     Negative/Trace Negative  Glucose, UA     Negative Negative  Ketones, UA     Negative Negative  RBC, UA     Negative Trace (A)  Bilirubin, UA     Negative Negative  Urobilinogen, Ur     0.2 - 1.0 mg/dL 0.2  Nitrite, UA     Negative Negative  Microscopic Examination      See below:   Component     Latest Ref Rng & Units 11/17/2020  WBC, UA     0 - 5 /hpf 6-10 (A)  RBC     0 - 2 /hpf 0-2  Epithelial Cells (non renal)     0 - 10 /hpf 0-10  Casts     None seen /lpf Present (A)  Cast Type     N/A Granular casts (A)  Bacteria, UA     None  seen/Few Few   I have reviewed the labs.   Pertinent Imaging: CLINICAL DATA:  50 year old with generalized abdominal pain. Suspected diverticulitis.   EXAM: CT ABDOMEN AND PELVIS WITHOUT CONTRAST   TECHNIQUE: Multidetector CT imaging of the abdomen and pelvis was performed following the standard protocol without IV contrast.   COMPARISON:  10/26/2019   FINDINGS: Lower chest: Lung bases are clear.   Hepatobiliary: 1.3 cm low-density structure along the anterior right hepatic dome on sequence 2, image 11. This corresponds with the lesion on the exam from 2021. 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 Stable appearance of the liver. Normal appearance of the gallbladder.   Pancreas: Unremarkable. No pancreatic ductal dilatation or surrounding inflammatory changes.   Spleen: Normal in size without focal abnormality.   Adrenals/Urinary Tract: Normal appearance of the adrenal glands. 2 mm stone in the right kidney lower pole without hydronephrosis. Three tiny stones in left kidney lower pole region without hydronephrosis. Normal appearance of the urinary bladder.   Stomach/Bowel: Normal appearance of the stomach and duodenum. Normal appendix. Normal appearance of the colon without inflammatory changes. No evidence for a bowel obstruction.   Vascular/Lymphatic: No significant vascular findings are present. No enlarged abdominal or pelvic lymph nodes.   Reproductive: Uterus and bilateral adnexa are unremarkable.   Other: Negative for free fluid.  Negative for free air.   Musculoskeletal: No acute  bone abnormality.   IMPRESSION: 1. Bilateral nephrolithiasis without hydronephrosis. 2. No acute bowel abnormality. Specifically, no evidence for diverticulitis. 3. Small hepatic lesion is similar in size from the exam in 2021. Suspect this represents a cavernous hemangioma based on the previous examination findings.     Electronically  Signed   By: Richarda Overlie M.D.   On: 10/04/2020 09:28     I have independently reviewed the films.  See HPI.   Assessment & Plan:    1. Neurogenic bladder -completed PTNS and PT -managed with self cathing BID daily -Advised her to only drink 2 to 3, 16 ounce bottles of water daily and when she is thirsty drink more  2. High risk hematuria -work up x 2 NED, most recent imaging 2022 -reports of gross hematuria -She is a difficult historian and has been almost 3 years since her last cystoscopy and she is quite anxious, so we will go ahead and repeat this exam for further evaluation of the gross hematuria.  I will defer any upper tract imaging at this time as she has had several throughout the years -I have explained to the patient that they will  be scheduled for a cystoscopy in our office to evaluate their bladder.  The cystoscopy consists of passing a tube with a lens up through their urethra and into their urinary bladder.   We will inject the urethra with a lidocaine gel prior to introducing the cystoscope to help with any discomfort during the procedure.   After the procedure, they might experience blood in the urine and discomfort with urination.  This will abate after the first few voids.  I have  encouraged the patient to increase water intake  during this time.  Patient denies any allergies to lidocaine.   -UA -Urine culture  Return for Cystoscopy for gross heme.  These notes generated with voice recognition software. I apologize for typographical errors.  Michiel Cowboy, PA-C  [facility] [address]  [address] [place], [place] [address] [phone]   I spent 30 minutes on the day of the encounter to include pre-visit record review, face-to-face time with the patient, and post-visit ordering of tests.

## 2020-11-17 ENCOUNTER — Other Ambulatory Visit: Payer: Self-pay

## 2020-11-17 ENCOUNTER — Ambulatory Visit: Payer: BC Managed Care – PPO | Admitting: Urology

## 2020-11-17 ENCOUNTER — Encounter: Payer: Self-pay | Admitting: Urology

## 2020-11-17 VITALS — BP 123/77 | HR 60 | Ht 70.0 in | Wt 142.0 lb

## 2020-11-17 DIAGNOSIS — N319 Neuromuscular dysfunction of bladder, unspecified: Secondary | ICD-10-CM | POA: Diagnosis not present

## 2020-11-17 DIAGNOSIS — R3129 Other microscopic hematuria: Secondary | ICD-10-CM

## 2020-11-17 DIAGNOSIS — R310 Gross hematuria: Secondary | ICD-10-CM | POA: Diagnosis not present

## 2020-11-17 LAB — MICROSCOPIC EXAMINATION

## 2020-11-17 LAB — URINALYSIS, COMPLETE
Bilirubin, UA: NEGATIVE
Glucose, UA: NEGATIVE
Ketones, UA: NEGATIVE
Nitrite, UA: NEGATIVE
Protein,UA: NEGATIVE
Specific Gravity, UA: 1.01 (ref 1.005–1.030)
Urobilinogen, Ur: 0.2 mg/dL (ref 0.2–1.0)
pH, UA: 7 (ref 5.0–7.5)

## 2020-11-18 ENCOUNTER — Ambulatory Visit: Payer: Self-pay | Admitting: Urology

## 2020-11-19 LAB — CULTURE, URINE COMPREHENSIVE

## 2020-11-21 ENCOUNTER — Other Ambulatory Visit: Payer: Self-pay | Admitting: Urology

## 2020-11-21 MED ORDER — NITROFURANTOIN MONOHYD MACRO 100 MG PO CAPS: 100.0000 mg | ORAL_CAPSULE | Freq: Two times a day (BID) | ORAL | 0 refills | Status: DC

## 2020-11-24 ENCOUNTER — Ambulatory Visit: Payer: Self-pay | Admitting: Urology

## 2020-11-24 ENCOUNTER — Ambulatory Visit: Payer: Self-pay | Admitting: Physician Assistant

## 2020-12-01 ENCOUNTER — Ambulatory Visit: Payer: BC Managed Care – PPO | Admitting: Podiatry

## 2020-12-01 ENCOUNTER — Other Ambulatory Visit: Payer: Self-pay

## 2020-12-01 DIAGNOSIS — R2689 Other abnormalities of gait and mobility: Secondary | ICD-10-CM | POA: Diagnosis not present

## 2020-12-01 DIAGNOSIS — G629 Polyneuropathy, unspecified: Secondary | ICD-10-CM | POA: Diagnosis not present

## 2020-12-01 DIAGNOSIS — M2170 Unequal limb length (acquired), unspecified site: Secondary | ICD-10-CM

## 2020-12-01 NOTE — Progress Notes (Signed)
She presents today states that she would like to consider orthotics that her neurologist is recommending orthotics for balance.  States that the heel lift outputted really helped her with her short leg.  Objective: Vital signs are stable oriented x3.  Pulses are palpable.  No erythema no cellulitis drainage or odor no change in physical exam.  Much decrease in Semmes Weinstein monofilament to the forefoot bilaterally.  Assessment: Neuropathy bilateral.  Short leg left.  Plan: She was sized for orthotics today we will follow-up with her in 1 month

## 2020-12-02 ENCOUNTER — Ambulatory Visit: Payer: BC Managed Care – PPO | Admitting: Urology

## 2020-12-02 ENCOUNTER — Encounter: Payer: Self-pay | Admitting: Urology

## 2020-12-02 VITALS — BP 112/70 | HR 66 | Ht 70.0 in | Wt 142.0 lb

## 2020-12-02 DIAGNOSIS — R310 Gross hematuria: Secondary | ICD-10-CM

## 2020-12-02 MED ORDER — LIDOCAINE HCL URETHRAL/MUCOSAL 2 % EX GEL: 1.0000 "application " | Freq: Once | CUTANEOUS | Status: DC

## 2020-12-02 NOTE — Progress Notes (Signed)
Cystoscopy Procedure Note:  Indication: Gross hematuria  Complex 50 year old female followed by Dr. Apolinar Junes and Michiel Cowboy extensively.  She was on my schedule today for a cystoscopy for gross hematuria.  She has not undergone previous negative work-ups, and extensive imaging that has been negative.  Most recently CT abdomen pelvis without contrast from 10/04/2020 that shows punctate nonobstructing renal stones, no other suspicious lesions.  Her bladder is managed by CIC 2x daily.  After informed consent and discussion of the procedure and its risks, Cristina Wade was positioned and prepped in the standard fashion. Cystoscopy was performed with a flexible cystoscope. The urethra, bladder neck and entire bladder was visualized in a standard fashion.  The bladder mucosa was grossly normal throughout.  The ureteral orifices were visualized in their normal location and orientation.  No abnormalities on retroflexion  Findings: Normal cystoscopy  Assessment and Plan: Continue CIC 2 times daily, RTC Shannon in 6 months symptom check  Legrand Rams, MD 12/02/2020

## 2020-12-03 ENCOUNTER — Emergency Department: Payer: BC Managed Care – PPO

## 2020-12-03 ENCOUNTER — Other Ambulatory Visit: Payer: Self-pay

## 2020-12-03 ENCOUNTER — Emergency Department
Admission: EM | Admit: 2020-12-03 | Discharge: 2020-12-03 | Disposition: A | Discharge status: Home / Self Care | Payer: BC Managed Care – PPO | Attending: Emergency Medicine | Admitting: Emergency Medicine

## 2020-12-03 DIAGNOSIS — J45909 Unspecified asthma, uncomplicated: Secondary | ICD-10-CM | POA: Insufficient documentation

## 2020-12-03 DIAGNOSIS — E876 Hypokalemia: Secondary | ICD-10-CM | POA: Diagnosis not present

## 2020-12-03 DIAGNOSIS — E162 Hypoglycemia, unspecified: Secondary | ICD-10-CM

## 2020-12-03 DIAGNOSIS — R072 Precordial pain: Secondary | ICD-10-CM | POA: Diagnosis not present

## 2020-12-03 DIAGNOSIS — R519 Headache, unspecified: Secondary | ICD-10-CM | POA: Diagnosis not present

## 2020-12-03 DIAGNOSIS — R202 Paresthesia of skin: Secondary | ICD-10-CM | POA: Diagnosis not present

## 2020-12-03 DIAGNOSIS — R739 Hyperglycemia, unspecified: Secondary | ICD-10-CM | POA: Insufficient documentation

## 2020-12-03 DIAGNOSIS — R079 Chest pain, unspecified: Secondary | ICD-10-CM

## 2020-12-03 LAB — BASIC METABOLIC PANEL
Anion gap: 7 (ref 5–15)
BUN: 10 mg/dL (ref 6–20)
CO2: 28 mmol/L (ref 22–32)
Calcium: 9.2 mg/dL (ref 8.9–10.3)
Chloride: 105 mmol/L (ref 98–111)
Creatinine, Ser: 0.78 mg/dL (ref 0.44–1.00)
GFR, Estimated: >60 mL/min (ref >60)
Glucose, Bld: 57 mg/dL — ABNORMAL LOW (ref 70–99)
Potassium: 3.3 mmol/L — ABNORMAL LOW (ref 3.5–5.1)
Sodium: 140 mmol/L (ref 135–145)

## 2020-12-03 LAB — CBC
HCT: 40.9 % (ref 36.0–46.0)
Hemoglobin: 13.9 g/dL (ref 12.0–15.0)
MCH: 30.5 pg (ref 26.0–34.0)
MCHC: 34 g/dL (ref 30.0–36.0)
MCV: 89.7 fL (ref 80.0–100.0)
Platelets: 185 10*3/uL (ref 150–400)
RBC: 4.56 MIL/uL (ref 3.87–5.11)
RDW: 12.9 % (ref 11.5–15.5)
WBC: 4.1 10*3/uL (ref 4.0–10.5)
nRBC: 0 % (ref 0.0–0.2)

## 2020-12-03 LAB — MAGNESIUM: Magnesium: 2.2 mg/dL (ref 1.7–2.4)

## 2020-12-03 LAB — CBG MONITORING, ED: Glucose-Capillary: 112 mg/dL — ABNORMAL HIGH (ref 70–99)

## 2020-12-03 LAB — TROPONIN I (HIGH SENSITIVITY)
Troponin I (High Sensitivity): 2 ng/L (ref <18)
Troponin I (High Sensitivity): 3 ng/L (ref <18)

## 2020-12-03 MED ORDER — POTASSIUM CHLORIDE CRYS ER 20 MEQ PO TBCR
40.0000 meq | EXTENDED_RELEASE_TABLET | Freq: Once | ORAL | Status: AC
  Administered 2020-12-03: 40 meq via ORAL
  Filled 2020-12-03: qty 2

## 2020-12-03 NOTE — ED Triage Notes (Signed)
Pt comes with c/o left sided and Central CP. Pt states this started last week. Pt states some numbness in both legs.  Pt denies any SOb

## 2020-12-03 NOTE — ED Provider Notes (Signed)
[facility] Emergency Department Provider Note  ____________________________________________   Event Date/Time   First MD Initiated Contact with Patient 12/03/20 1319     (approximate)  I have reviewed the triage vital signs and the nursing notes.   HISTORY  Chief Complaint Chest Pain   HPI Cristina Wade is a 50 y.o. female who has medical history of asthma, chronic pelvic pain, cystitis, frequent headaches, neuropathy, and HDL who presents for assessment approximately 1 week of some intermittent substernal chest pressure and tightness as well as headaches and some intermittent tingling in her arms and legs.  Patient denies any vision changes, earache, sore throat, cough, shortness of breath, abdominal pain, vomiting, diarrhea, dysuria, rash or recent injuries or falls.  He states that sometimes will have pain in her tingling sensation in her extremities from her neuropathy but this feels a little different last couple days.  She denies any other acute concerns at this time.         Past Medical History:  Diagnosis Date   Asthma    Chronic pelvic pain in female 2018   Cystitis    Cystocele    Endometrial polyp    Endometriosis    per pt report but never seen during surg   Frequent headaches    Gastritis    GERD (gastroesophageal reflux disease)    rare   Gross hematuria    History of kidney stones    Microscopic hematuria    Migraine    Migraines    Neuropathy    Osteopenia    Urinary disorder    UTI (lower urinary tract infection)     Patient Active Problem List   Diagnosis Date Noted   Dysuria 08/20/2020   Neurogenic bladder 08/20/2020   Pelvic pain in female 12/25/2019   Chronic low back pain 12/25/2019   Chronic venous insufficiency 12/24/2019   Lymphedema 12/24/2019   Lower extremity edema 12/03/2019   HLD (hyperlipidemia) 10/10/2019   Low back pain radiating to left leg 10/04/2019   Dyspnea on exertion 09/20/2019   Orthopnea  09/20/2019   Chronic abdominal pain 05/14/2019   Irritable bowel syndrome with constipation 05/14/2019   Chronic constipation 12/25/2018   Urinary retention 08/12/2018   Anxiety 08/12/2018   Elevated blood pressure reading without diagnosis of hypertension 07/12/2018   Left leg pain 07/12/2018   Acute left-sided low back pain 06/03/2018   Cloudy urine 04/29/2018   Chest pain 04/29/2018   Frontal headache 04/27/2018   Localized, primary osteoarthritis 02/07/2018   Pelvic pain 01/30/2018   Slow transit constipation 10/10/2017   Bilateral impacted cerumen 11/28/2016   Chronic bilateral low back pain without sciatica 11/28/2016   Precordial pain 07/07/2016   Right bundle branch block (RBBB) on electrocardiogram (ECG) 07/07/2016   Chest tightness 06/27/2016   Preventative health care 09/30/2015   Encounter to establish care 09/28/2014   Vestibular migraine 09/28/2014   Routine physical examination 11/21/2013    Past Surgical History:  Procedure Laterality Date   COLONOSCOPY N/A 11/05/2014   Procedure: COLONOSCOPY;  Surgeon: Scot Jun, MD;  Location: [facility] ENDOSCOPY;  Service: Endoscopy;  Laterality: N/A;   COLONOSCOPY  11/2016   Dr. Mechele Collin   CYSTOSCOPY  2007   with biopsy    CYSTOSCOPY N/A 06/01/2016   Procedure: CYSTOSCOPY;  Surgeon: Vena Austria, MD;  Location: [facility] ORS;  Service: Gynecology;  Laterality: N/A;   DIAGNOSTIC LAPAROSCOPY     DILATATION & CURETTAGE/HYSTEROSCOPY WITH MYOSURE N/A 04/13/2015  Procedure: DILATATION & CURETTAGE/HYSTEROSCOPY WITH MYOSURE/POLYPECTOMY;  Surgeon: Elenora Fender Ward, MD;  Location: [facility] ORS;  Service: Gynecology;  Laterality: N/A;   DILATATION & CURETTAGE/HYSTEROSCOPY WITH MYOSURE N/A 06/01/2016   Procedure: DILATATION & CURETTAGE/HYSTEROSCOPY WITH MYOSURE;  Surgeon: Vena Austria, MD;  Location: [facility] ORS;  Service: Gynecology;  Laterality: N/A;   GUM SURGERY     laproscopy  2007   POLYPECTOMY     endometrial    Prior to  Admission medications   Medication Sig Start Date End Date Taking? Authorizing Provider  nitrofurantoin, macrocrystal-monohydrate, (MACROBID) 100 MG capsule Take 1 capsule (100 mg total) by mouth every 12 (twelve) hours. 11/21/20   Michiel Cowboy A, PA-C  Azelastine HCl 137 MCG/SPRAY SOLN SMARTSIG:2 Spray(s) Both Nares Twice Daily PRN 07/21/19   [provider]  Calcium Carbonate-Vit D-Min (CALCIUM 1200 PO) Take 2-3 tablets by mouth daily. Reported on 05/26/2015    [provider]  meloxicam (MOBIC) 7.5 MG tablet Take by mouth. 01/13/20 01/12/21  [provider]  mometasone (NASONEX) 50 MCG/ACT nasal spray 2 sprays daily. 06/26/20   [provider]  polyethylene glycol (MIRALAX / GLYCOLAX) 17 g packet Take 17 g by mouth daily.    [provider]  pregabalin (LYRICA) 100 MG capsule Take 100 mg by mouth 2 (two) times daily.    [provider]  psyllium (REGULOID) 0.52 g capsule Take 0.52 g by mouth daily.    [provider]    Allergies Lac bovis, Milk-related compounds, Omeprazole, and Penicillins  Family History  Problem Relation Age of Onset   Colon cancer Father 75   Arthritis Father    Hyperlipidemia Father    Transient ischemic attack Father    Cancer - Colon Father 77   Prostate cancer Father 70   Valvular heart disease Father    Arthritis Mother    Heart disease Mother    Stroke Mother        TIA   Hypertension Mother    Diabetes Mother    Kidney cancer Mother 20   Heart failure Mother    Hyperlipidemia Mother    Cancer Maternal Grandmother    Cancer Paternal Grandfather        prostate   Breast cancer Cousin 11       has contact    Social History Social History   Tobacco Use   Smoking status: Never   Smokeless tobacco: Never  Vaping Use   Vaping Use: Never used  Substance Use Topics   Alcohol use: No   Drug use: No    Review of Systems  Review of Systems  Constitutional:  Negative for chills and  fever.  HENT:  Negative for sore throat.   Eyes:  Negative for pain.  Respiratory:  Negative for cough and stridor.   Cardiovascular:  Positive for chest pain.  Gastrointestinal:  Negative for vomiting.  Genitourinary:  Negative for dysuria.  Musculoskeletal:  Negative for myalgias.  Skin:  Negative for rash.  Neurological:  Positive for sensory change and headaches. Negative for seizures and loss of consciousness.  Psychiatric/Behavioral:  Negative for suicidal ideas.   All other systems reviewed and are negative.    ____________________________________________   PHYSICAL EXAM:  VITAL SIGNS: ED Triage Vitals  Enc Vitals Group     BP 12/03/20 1031 123/79     Pulse Rate 12/03/20 1031 67     Resp 12/03/20 1031 18     Temp 12/03/20 1031 98 F (36.7 C)  Temp src --      SpO2 12/03/20 1031 100 %     Weight --      Height --      Head Circumference --      Peak Flow --      Pain Score 12/03/20 1041 4     Pain Loc --      Pain Edu? --      Excl. in GC? --    Vitals:   12/03/20 1336 12/03/20 1418  BP: 111/78 119/78  Pulse:  71  Resp: 16 18  Temp:    SpO2:  99%   Physical Exam Vitals and nursing note reviewed.  Constitutional:      General: She is not in acute distress.    Appearance: She is well-developed.  HENT:     Head: Normocephalic and atraumatic.  Eyes:     Conjunctiva/sclera: Conjunctivae normal.  Cardiovascular:     Rate and Rhythm: Normal rate and regular rhythm.     Heart sounds: No murmur heard. Pulmonary:     Effort: Pulmonary effort is normal. No respiratory distress.     Breath sounds: Normal breath sounds.  Abdominal:     Palpations: Abdomen is soft.     Tenderness: There is no abdominal tenderness.  Musculoskeletal:     Cervical back: Neck supple.  Skin:    General: Skin is warm and dry.  Neurological:     Mental Status: She is alert.    Cranial nerves II through XII grossly intact.  No pronator drift.  No finger dysmetria.  Symmetric  5/5 strength of all extremities.  Sensation intact to light touch in all extremities.  Unremarkable unassisted gait.  Her back is unremarkable for any significant tenderness or overlying skin changes. ____________________________________________   LABS (all labs ordered are listed, but only abnormal results are displayed)  Labs Reviewed  BASIC METABOLIC PANEL - Abnormal; Notable for the following components:      Result Value   Potassium 3.3 (*)    Glucose, Bld 57 (*)    All other components within normal limits  CBG MONITORING, ED - Abnormal; Notable for the following components:   Glucose-Capillary 112 (*)    All other components within normal limits  CBC  MAGNESIUM  POC URINE PREG, ED  TROPONIN I (HIGH SENSITIVITY)  TROPONIN I (HIGH SENSITIVITY)   ____________________________________________  EKG  Sinus rhythm with ventricular of 63, low amplitude in lead I without other clear evidence of acute ischemia or significant arrhythmia. ____________________________________________  RADIOLOGY  ED MD interpretation: Chest x-rays no effusion, edema, pneumothorax or any other clear acute intrathoracic process.  Official radiology report(s): DG Chest 2 View  Result Date: 12/03/2020 CLINICAL DATA:  Chest pain. EXAM: CHEST - 2 VIEW COMPARISON:  Chest x-ray dated September 22, 2020. FINDINGS: The heart size and mediastinal contours are within normal limits. Both lungs are clear. The visualized skeletal structures are unremarkable. IMPRESSION: No active cardiopulmonary disease. Electronically Signed   By: Obie DredgeWilliam T Derry M.D.   On: 12/03/2020 11:22    ____________________________________________   PROCEDURES  Procedure(s) performed (including Critical Care):  Procedures   ____________________________________________   INITIAL IMPRESSION / ASSESSMENT AND PLAN / ED COURSE      Patient presents with above-stated history exam for several complaints including on and off headache,  chest tightness and some paresthesias in her extremities and states is little different than her usual neuropathy.  On arrival she is afebrile hemodynamically stable.  She is a  nonfocal neuro exam and her lungs are clear bilaterally.  Regard to her headache and certainly possible is related to migraine versus other primary headache disorder.  There is no history or exam features to suggest recent trauma or deep space infection in the head or neck.  I have a low suspicion for temporal arteritis or CVA and overall description of waxing waning nature is not consistent with subdural or subarachnoid hemorrhage.  In addition her BMP aside from a K3.3 glucose of 57 shows no significant electrode or metabolic derangements.  Potassium was repleted and patient sugar rechecked 112.  Patient was subsequently to be eating crackers and drinking fluids.  He also stated her headache had resolved.  Suspect he may have had a mild headache secondary to some mild hyperglycemia and low potassium.  This could also explain her paresthesias.  Advised her importance of staying adequately hydrated and taking adequate p.o. intake.  She does not take insulin.  With Carter chest pain I have a low suspicion for PE as patient is not tachycardic, hypoxic, tachypneic states it is waxing and waning of the last week without clear recent risk factors.  CBC shows no leukocytosis or anemia.  Chest x-ray is unremarkable for acute intrathoracic process.  ECG and nonelevated troponins x2 are not suggestive of ACS.  Given patient stating she is feeling much better after potassium and some snacks with otherwise stable vitals and reassuring exam and work-up I think she is stable for discharge with close outpatient follow-up.  Discharged in stable condition.  Strict return precautions advised and discussed.        ____________________________________________   FINAL CLINICAL IMPRESSION(S) / ED DIAGNOSES  Final diagnoses:  Chest pain,  unspecified type  Hypoglycemia  Paresthesia  Hypokalemia    Medications  potassium chloride SA (KLOR-CON) CR tablet 40 mEq (40 mEq Oral Given 12/03/20 1338)     ED Discharge Orders     None        Note:  This document was prepared using Dragon voice recognition software and may include unintentional dictation errors.    Gilles Chiquito, MD 12/03/20 [phone]

## 2020-12-06 LAB — POC URINE PREG, ED: Preg Test, Ur: NEGATIVE

## 2020-12-22 ENCOUNTER — Other Ambulatory Visit: Payer: Self-pay

## 2020-12-22 DIAGNOSIS — R300 Dysuria: Secondary | ICD-10-CM

## 2020-12-24 ENCOUNTER — Other Ambulatory Visit: Payer: Self-pay

## 2020-12-24 ENCOUNTER — Other Ambulatory Visit: Payer: BC Managed Care – PPO

## 2020-12-24 ENCOUNTER — Other Ambulatory Visit (INDEPENDENT_AMBULATORY_CARE_PROVIDER_SITE_OTHER): Payer: BC Managed Care – PPO

## 2020-12-24 DIAGNOSIS — Z0000 Encounter for general adult medical examination without abnormal findings: Secondary | ICD-10-CM

## 2020-12-24 DIAGNOSIS — R300 Dysuria: Secondary | ICD-10-CM

## 2020-12-24 NOTE — Addendum Note (Signed)
Addended by: Kindall Swaby S on: 12/24/2020 10:10 AM   Modules accepted: Orders  

## 2020-12-24 NOTE — Addendum Note (Signed)
Addended by: Warden Fillers on: 12/24/2020 10:10 AM   Modules accepted: Orders

## 2020-12-24 NOTE — Addendum Note (Signed)
Addended by: Eyden Dobie S on: 12/24/2020 10:10 AM   Modules accepted: Orders  

## 2020-12-25 LAB — URINALYSIS, ROUTINE W REFLEX MICROSCOPIC
Bilirubin, UA: NEGATIVE
Glucose, UA: NEGATIVE
Ketones, UA: NEGATIVE
Nitrite, UA: POSITIVE — AB
Protein,UA: NEGATIVE
RBC, UA: NEGATIVE
Specific Gravity, UA: 1.012 (ref 1.005–1.030)
Urobilinogen, Ur: 0.2 mg/dL (ref 0.2–1.0)
pH, UA: 8 — ABNORMAL HIGH (ref 5.0–7.5)

## 2020-12-25 LAB — CBC WITH DIFFERENTIAL/PLATELET
Basophils Absolute: 0.1 10*3/uL (ref 0.0–0.2)
Basos: 1 %
EOS (ABSOLUTE): 0 10*3/uL (ref 0.0–0.4)
Eos: 1 %
Hematocrit: 40.6 % (ref 34.0–46.6)
Hemoglobin: 13.4 g/dL (ref 11.1–15.9)
Immature Grans (Abs): 0 10*3/uL (ref 0.0–0.1)
Immature Granulocytes: 0 %
Lymphocytes Absolute: 1.8 10*3/uL (ref 0.7–3.1)
Lymphs: 28 %
MCH: 29.7 pg (ref 26.6–33.0)
MCHC: 33 g/dL (ref 31.5–35.7)
MCV: 90 fL (ref 79–97)
Monocytes Absolute: 0.6 10*3/uL (ref 0.1–0.9)
Monocytes: 9 %
Neutrophils Absolute: 4.1 10*3/uL (ref 1.4–7.0)
Neutrophils: 61 %
Platelets: 246 10*3/uL (ref 150–450)
RBC: 4.51 x10E6/uL (ref 3.77–5.28)
RDW: 12.8 % (ref 11.7–15.4)
WBC: 6.6 10*3/uL (ref 3.4–10.8)

## 2020-12-25 LAB — MICROSCOPIC EXAMINATION: Casts: NONE SEEN /lpf

## 2020-12-27 ENCOUNTER — Telehealth: Payer: Self-pay | Admitting: Family Medicine

## 2020-12-27 NOTE — Telephone Encounter (Signed)
Patient called and states she is using catheters and is wanting to get the Catheter kits with the iodine and everything. I don't see any documentation on this. She said she went to [facility] and they ordered the catheter kits. She states she chose [facility] instead of [facility].

## 2020-12-27 NOTE — Telephone Encounter (Signed)
We can just call [facility] and refill the order.

## 2020-12-28 ENCOUNTER — Encounter: Payer: BC Managed Care – PPO | Admitting: Obstetrics and Gynecology

## 2020-12-28 NOTE — Telephone Encounter (Signed)
Is there a form or something to fill out? We can't refill [facility] order. What kind of kits?

## 2020-12-29 ENCOUNTER — Telehealth: Payer: Self-pay | Admitting: Family

## 2020-12-29 DIAGNOSIS — R300 Dysuria: Secondary | ICD-10-CM

## 2020-12-29 MED ORDER — NITROFURANTOIN MONOHYD MACRO 100 MG PO CAPS: 100.0000 mg | ORAL_CAPSULE | Freq: Two times a day (BID) | ORAL | 0 refills | Status: DC

## 2020-12-29 NOTE — Telephone Encounter (Signed)
Patient calling back in and states that at times she feels like she does not have a UTI. Wanting to know if she should start the Macrobid now or wait for the culture to come back?

## 2020-12-29 NOTE — Addendum Note (Signed)
Addended by: [name] on: 12/29/2020 01:11 PM   Modules accepted: Orders

## 2020-12-29 NOTE — Telephone Encounter (Signed)
I spoke with patient & advised that this was up to her. I advised that if pain developed & odor worsened that she should start. UA did have nitrates which usually indicates UTI as well. Culture was left Friday & has not yet resulted. We are calling [facility] to check on status. If new culture needed I will call patient back to let her know.

## 2020-12-29 NOTE — Telephone Encounter (Signed)
I called and spoke with patient & let her know that Macrobid was sent. She has been eating yogurt daily already, so she will continue. She will also call urology to make sure they are aware that she has another UTI & will continue to follow.

## 2020-12-29 NOTE — Telephone Encounter (Signed)
Patient calling in to check on urine results. States she is still having an order. Please advise

## 2020-12-29 NOTE — Telephone Encounter (Signed)
Quenten Raven advised that [facility] was running just they were behind. I let patient know this & she will start antibiotic. We will let patient know as soon as we get results & if we don't she will  leave another specimen.

## 2020-12-29 NOTE — Telephone Encounter (Signed)
Do you want empirically treat? Urine culture still has not returned.

## 2020-12-29 NOTE — Telephone Encounter (Signed)
Call pt  UA shows nitrites. Culture has not returned  Last culture grew  Staphylococcus epidermidis   Sensitive to Macrobid.  I called Macrobid.  Please ensure she continues to follow with urology due to recurrent UTIs  Ensure to take probiotics while on antibiotics and also for 2 weeks after completion. This can either be by eating yogurt daily or taking a probiotic supplement over the counter such as Culturelle.It is important to re-colonize the gut with good bacteria and also to prevent any diarrheal infections associated with antibiotic use.   If she develops fever, worsening dysuria, please advise that she goes to urgent care to be evaluated in person

## 2020-12-30 ENCOUNTER — Other Ambulatory Visit: Payer: Self-pay

## 2020-12-30 DIAGNOSIS — J45909 Unspecified asthma, uncomplicated: Secondary | ICD-10-CM | POA: Insufficient documentation

## 2020-12-30 DIAGNOSIS — N390 Urinary tract infection, site not specified: Secondary | ICD-10-CM | POA: Insufficient documentation

## 2020-12-30 DIAGNOSIS — Z7951 Long term (current) use of inhaled steroids: Secondary | ICD-10-CM | POA: Diagnosis not present

## 2020-12-30 DIAGNOSIS — R109 Unspecified abdominal pain: Secondary | ICD-10-CM | POA: Diagnosis present

## 2020-12-30 NOTE — ED Triage Notes (Signed)
Pt was dx with UTI last week and started macrobid, was changed to cipro today. Pt states here to "make sure not in blood stream". Pt here for chills and dysuria.

## 2020-12-31 ENCOUNTER — Emergency Department
Admission: EM | Admit: 2020-12-31 | Discharge: 2020-12-31 | Disposition: A | Discharge status: Home / Self Care | Payer: BC Managed Care – PPO | Attending: Emergency Medicine | Admitting: Emergency Medicine

## 2020-12-31 DIAGNOSIS — N390 Urinary tract infection, site not specified: Secondary | ICD-10-CM

## 2020-12-31 LAB — COMPREHENSIVE METABOLIC PANEL
ALT: 18 U/L (ref 0–44)
AST: 20 U/L (ref 15–41)
Albumin: 4.6 g/dL (ref 3.5–5.0)
Alkaline Phosphatase: 56 U/L (ref 38–126)
Anion gap: 8 (ref 5–15)
BUN: 11 mg/dL (ref 6–20)
CO2: 28 mmol/L (ref 22–32)
Calcium: 9.6 mg/dL (ref 8.9–10.3)
Chloride: 102 mmol/L (ref 98–111)
Creatinine, Ser: 0.73 mg/dL (ref 0.44–1.00)
GFR, Estimated: >60 mL/min (ref >60)
Glucose, Bld: 96 mg/dL (ref 70–99)
Potassium: 4.2 mmol/L (ref 3.5–5.1)
Sodium: 138 mmol/L (ref 135–145)
Total Bilirubin: 0.9 mg/dL (ref 0.3–1.2)
Total Protein: 7.4 g/dL (ref 6.5–8.1)

## 2020-12-31 LAB — CBC
HCT: 40 % (ref 36.0–46.0)
Hemoglobin: 13.7 g/dL (ref 12.0–15.0)
MCH: 31.3 pg (ref 26.0–34.0)
MCHC: 34.3 g/dL (ref 30.0–36.0)
MCV: 91.3 fL (ref 80.0–100.0)
Platelets: 204 10*3/uL (ref 150–400)
RBC: 4.38 MIL/uL (ref 3.87–5.11)
RDW: 13.2 % (ref 11.5–15.5)
WBC: 6.4 10*3/uL (ref 4.0–10.5)
nRBC: 0 % (ref 0.0–0.2)

## 2020-12-31 LAB — URINE CULTURE

## 2020-12-31 LAB — URINALYSIS, COMPLETE (UACMP) WITH MICROSCOPIC
Bilirubin Urine: NEGATIVE
Glucose, UA: NEGATIVE mg/dL
Hgb urine dipstick: NEGATIVE
Ketones, ur: NEGATIVE mg/dL
Nitrite: NEGATIVE
Protein, ur: NEGATIVE mg/dL
Specific Gravity, Urine: 1.003 — ABNORMAL LOW (ref 1.005–1.030)
pH: 8 (ref 5.0–8.0)

## 2020-12-31 NOTE — ED Provider Notes (Signed)
[facility]  ____________________________________________   Event Date/Time   First MD Initiated Contact with Patient 12/31/20 0259     (approximate)  I have reviewed the triage vital signs and the nursing notes.   HISTORY  Chief Complaint Abdominal Pain    HPI Cristina Wade is a 50 y.o. female with past medical history of neurogenic bladder, cirrhosis, frequent UTI, asthma who presents with concern for UTI.  Patient developed dysuria and abnormal odor to her urine approximately 7 days ago.  She was initially prescribed Macrobid which she took 5 days ago.  She then went to a different physician yesterday with complaint of flank pain and was prescribed ciprofloxacin which she has taken 1 dose.  Today she denies any ongoing urinary symptoms and her flank pain is improving.  However she was concerned that the infection could be spreading.  She denies fevers or chills.  Denies nausea vomiting, fevers or chills.         Past Medical History:  Diagnosis Date   Asthma    Chronic pelvic pain in female 2018   Cystitis    Cystocele    Endometrial polyp    Endometriosis    per pt report but never seen during surg   Frequent headaches    Gastritis    GERD (gastroesophageal reflux disease)    rare   Gross hematuria    History of kidney stones    Microscopic hematuria    Migraine    Migraines    Neuropathy    Osteopenia    Urinary disorder    UTI (lower urinary tract infection)     Patient Active Problem List   Diagnosis Date Noted   Dysuria 08/20/2020   Neurogenic bladder 08/20/2020   Pelvic pain in female 12/25/2019   Chronic low back pain 12/25/2019   Chronic venous insufficiency 12/24/2019   Lymphedema 12/24/2019   Lower extremity edema 12/03/2019   HLD (hyperlipidemia) 10/10/2019   Low back pain radiating to left leg 10/04/2019   Dyspnea on exertion 09/20/2019   Orthopnea 09/20/2019   Chronic abdominal pain 05/14/2019   Irritable bowel  syndrome with constipation 05/14/2019   Chronic constipation 12/25/2018   Urinary retention 08/12/2018   Anxiety 08/12/2018   Elevated blood pressure reading without diagnosis of hypertension 07/12/2018   Left leg pain 07/12/2018   Acute left-sided low back pain 06/03/2018   Cloudy urine 04/29/2018   Chest pain 04/29/2018   Frontal headache 04/27/2018   Localized, primary osteoarthritis 02/07/2018   Pelvic pain 01/30/2018   Slow transit constipation 10/10/2017   Bilateral impacted cerumen 11/28/2016   Chronic bilateral low back pain without sciatica 11/28/2016   Precordial pain 07/07/2016   Right bundle branch block (RBBB) on electrocardiogram (ECG) 07/07/2016   Chest tightness 06/27/2016   Preventative health care 09/30/2015   Encounter to establish care 09/28/2014   Vestibular migraine 09/28/2014   Routine physical examination 11/21/2013    Past Surgical History:  Procedure Laterality Date   COLONOSCOPY N/A 11/05/2014   Procedure: COLONOSCOPY;  Surgeon: Scot Jun, MD;  Location: [facility] ENDOSCOPY;  Service: Endoscopy;  Laterality: N/A;   COLONOSCOPY  11/2016   Dr. Mechele Collin   CYSTOSCOPY  2007   with biopsy    CYSTOSCOPY N/A 06/01/2016   Procedure: CYSTOSCOPY;  Surgeon: Vena Austria, MD;  Location: [facility] ORS;  Service: Gynecology;  Laterality: N/A;   DIAGNOSTIC LAPAROSCOPY     DILATATION & CURETTAGE/HYSTEROSCOPY WITH MYOSURE N/A 04/13/2015   Procedure: DILATATION &  CURETTAGE/HYSTEROSCOPY WITH MYOSURE/POLYPECTOMY;  Surgeon: Elenora Fender Ward, MD;  Location: [facility] ORS;  Service: Gynecology;  Laterality: N/A;   DILATATION & CURETTAGE/HYSTEROSCOPY WITH MYOSURE N/A 06/01/2016   Procedure: DILATATION & CURETTAGE/HYSTEROSCOPY WITH MYOSURE;  Surgeon: Vena Austria, MD;  Location: [facility] ORS;  Service: Gynecology;  Laterality: N/A;   GUM SURGERY     laproscopy  2007   POLYPECTOMY     endometrial    Prior to Admission medications   Medication Sig Start Date End Date Taking?  Authorizing Provider  Azelastine HCl 137 MCG/SPRAY SOLN SMARTSIG:2 Spray(s) Both Nares Twice Daily PRN 07/21/19   [provider]  Calcium Carbonate-Vit D-Min (CALCIUM 1200 PO) Take 2-3 tablets by mouth daily. Reported on 05/26/2015    [provider]  meloxicam (MOBIC) 7.5 MG tablet Take by mouth. 01/13/20 01/12/21  [provider]  mometasone (NASONEX) 50 MCG/ACT nasal spray 2 sprays daily. 06/26/20   [provider]  nitrofurantoin, macrocrystal-monohydrate, (MACROBID) 100 MG capsule Take 1 capsule (100 mg total) by mouth 2 (two) times daily. Take with food. 12/29/20   [name], FNP  polyethylene glycol (MIRALAX / GLYCOLAX) 17 g packet Take 17 g by mouth daily.    [provider]  pregabalin (LYRICA) 100 MG capsule Take 100 mg by mouth 2 (two) times daily.    [provider]  psyllium (REGULOID) 0.52 g capsule Take 0.52 g by mouth daily.    [provider]    Allergies Lac bovis, Milk-related compounds, Omeprazole, and Penicillins  Family History  Problem Relation Age of Onset   Colon cancer Father 9   Arthritis Father    Hyperlipidemia Father    Transient ischemic attack Father    Cancer - Colon Father 33   Prostate cancer Father 51   Valvular heart disease Father    Arthritis Mother    Heart disease Mother    Stroke Mother        TIA   Hypertension Mother    Diabetes Mother    Kidney cancer Mother 72   Heart failure Mother    Hyperlipidemia Mother    Cancer Maternal Grandmother    Cancer Paternal Grandfather        prostate   Breast cancer Cousin 86       has contact    Social History Social History   Tobacco Use   Smoking status: Never   Smokeless tobacco: Never  Vaping Use   Vaping Use: Never used  Substance Use Topics   Alcohol use: No   Drug use: No    Review of Systems   Review of Systems  Constitutional:  Negative for chills and fever.  Gastrointestinal:  Negative for abdominal pain,  nausea and vomiting.  Genitourinary:  Negative for dysuria, flank pain and hematuria.  All other systems reviewed and are negative.  Physical Exam Updated Vital Signs BP (!) 143/89   Pulse 62   Temp 98 F (36.7 C) (Tympanic)   Resp 17   Ht 5\' 10"  (1.778 m)   Wt 64.4 kg   LMP  (Exact Date)   SpO2 100%   BMI 20.37 kg/m   Physical Exam Vitals and nursing note reviewed.  Constitutional:      General: She is not in acute distress.    Appearance: Normal appearance.  HENT:     Head: Normocephalic and atraumatic.  Eyes:     General: No scleral icterus.    Conjunctiva/sclera: Conjunctivae normal.  Pulmonary:  Effort: Pulmonary effort is normal. No respiratory distress.     Breath sounds: No stridor.  Abdominal:     General: Abdomen is flat.     Palpations: Abdomen is soft.     Tenderness: There is no abdominal tenderness. There is no right CVA tenderness or left CVA tenderness.  Musculoskeletal:        General: No deformity or signs of injury.     Cervical back: Normal range of motion.  Skin:    General: Skin is dry.     Coloration: Skin is not jaundiced or pale.  Neurological:     General: No focal deficit present.     Mental Status: She is alert and oriented to person, place, and time. Mental status is at baseline.  Psychiatric:        Mood and Affect: Mood normal.        Behavior: Behavior normal.     LABS (all labs ordered are listed, but only abnormal results are displayed)  Labs Reviewed  URINALYSIS, COMPLETE (UACMP) WITH MICROSCOPIC - Abnormal; Notable for the following components:      Result Value   Color, Urine STRAW (*)    APPearance CLEAR (*)    Specific Gravity, Urine 1.003 (*)    Leukocytes,Ua TRACE (*)    Bacteria, UA RARE (*)    All other components within normal limits  CBC  COMPREHENSIVE METABOLIC PANEL   ____________________________________________  EKG  N/a ____________________________________________  RADIOLOGY Ky Barban, personally viewed and evaluated these images (plain radiographs) as part of my medical decision making, as well as reviewing the written report by the radiologist.  ED MD interpretation:  n/a    ____________________________________________   PROCEDURES  Procedure(s) performed (including Critical Care):  Procedures   ____________________________________________   INITIAL IMPRESSION / ASSESSMENT AND PLAN / ED COURSE     50 year old female with history of neurogenic bladder presents with ongoing concern for UTI.  Patient has received 2 antibiotics both Macrobid and not ciprofloxacin.  She is concerned that the UTIs not been treated however her symptoms are largely resolved she has no ongoing urinary symptoms no flank pain no fevers no chills.  Vital signs are within normal limits today.  On exam she has no CVA tenderness abdomen is benign.  Labs obtained from triage are unremarkable, she has no leukocytosis.  Her urine is largely clean.  I reassured the patient that the infection is under control and she should continue to take the ciprofloxacin as prescribed.  Return precautions discussed.      ____________________________________________   FINAL CLINICAL IMPRESSION(S) / ED DIAGNOSES  Final diagnoses:  Urinary tract infection without hematuria, site unspecified     ED Discharge Orders     None        Note:  This document was prepared using Dragon voice recognition software and may include unintentional dictation errors.    Georga Hacking, MD 12/31/20 [phone]

## 2020-12-31 NOTE — Discharge Instructions (Signed)
Your urine sample is clearing up well.  It does not look like the infection has brought your kidneys.  Please continue to take the antibiotic as prescribed.  Please follow-up with your primary care provider as needed.

## 2021-01-03 NOTE — Telephone Encounter (Signed)
Patient left a message requesting a status update on cath kit order from [facility]

## 2021-01-05 ENCOUNTER — Telehealth: Payer: Self-pay

## 2021-01-05 NOTE — Telephone Encounter (Signed)
Incoming call from pt who states that she went to the walk in clinic for UTI symptoms. She is currently being treated with an antibiotic however she does not feel that it is helping. She complains of bladder pain, kidney pain, and back pain only. Denies fever, chills, dysuria, nausea or vomiting. She has approximately 3 doses left of her current antibiotic. She also questions if there has been an update on her catheter order. Please advise.

## 2021-01-05 NOTE — Telephone Encounter (Signed)
Pt went to [facility] on 9/15 and [facility] on 9/16, notes are in chart.

## 2021-01-11 NOTE — Progress Notes (Signed)
01/12/2021 3:21 PM   Cristina Wade 1970-11-13 [id number]  Referring provider: [name], FNP [address] [place],  [place] [address]  Chief Complaint  Patient presents with   Urinary Tract Infection     Urological history: 1. High risk hematuria -non-smoker -work up 2015 -bilateral punctate stones -cysto 2019 NED -no reports of gross hematuria -UA negative for micro heme  2. Neurogenic bladder -UDS 2017 indicating nonobstructive urinary retention with poor bladder sensation and external sphincter dyssynergia -completed 12 weeks of PTNS -completed pelvic floor PT -manages with self cathing once daily  3. Nephrolithiasis -non-contrast CT 2022 2 mm stone in the right kidney lower pole without hydronephrosis.  Three tiny stones in left kidney lower pole region without hydronephrosis  4. rUTI's -contributing factors of age, incomplete emptying, vaginal atrophy, chronic constipation,  -documented positive urine cultures over the last year  03/06/2020 Klebsiella pneumoniae  08/20/2020 citrobacter freundii  09/18/2020 proteus mirabilis  10/21/2020 citrobacter freundii  12/07/2020 staph epidermis/B-hemolytic strep  12/24/2020 Klebsiella pneumoniae  12/30/2020 Klebsiella pneumoniae/B-hemolytic strep    HPI: Cristina Wade is a 50 y.o. female who presents today for possible UTI with her sister, Cristina Wade.  She has been experiencing lower pelvic pain, lower back pain and cloudy urine.  Patient denies any modifying or aggravating factors.  Patient denies any gross hematuria, dysuria or suprapubic/flank pain.  Patient denies any fevers, chills, nausea or vomiting.    She is cathing twice daily with residuals of 300 to 500 cc.  She is able to void spontaneously as well.  CATH UA few bacteria.  PVR 800 cc.  PMH: Past Medical History:  Diagnosis Date   Asthma    Chronic pelvic pain in female 2018   Cystitis    Cystocele    Endometrial polyp     Endometriosis    per pt report but never seen during surg   Frequent headaches    Gastritis    GERD (gastroesophageal reflux disease)    rare   Gross hematuria    History of kidney stones    Microscopic hematuria    Migraine    Migraines    Neuropathy    Osteopenia    Urinary disorder    UTI (lower urinary tract infection)     Surgical History: Past Surgical History:  Procedure Laterality Date   COLONOSCOPY N/A 11/05/2014   Procedure: COLONOSCOPY;  Surgeon: Scot Jun, MD;  Location: [facility] ENDOSCOPY;  Service: Endoscopy;  Laterality: N/A;   COLONOSCOPY  11/2016   Dr. Mechele Collin   CYSTOSCOPY  2007   with biopsy    CYSTOSCOPY N/A 06/01/2016   Procedure: CYSTOSCOPY;  Surgeon: Vena Austria, MD;  Location: [facility] ORS;  Service: Gynecology;  Laterality: N/A;   DIAGNOSTIC LAPAROSCOPY     DILATATION & CURETTAGE/HYSTEROSCOPY WITH MYOSURE N/A 04/13/2015   Procedure: DILATATION & CURETTAGE/HYSTEROSCOPY WITH MYOSURE/POLYPECTOMY;  Surgeon: Elenora Fender Ward, MD;  Location: [facility] ORS;  Service: Gynecology;  Laterality: N/A;   DILATATION & CURETTAGE/HYSTEROSCOPY WITH MYOSURE N/A 06/01/2016   Procedure: DILATATION & CURETTAGE/HYSTEROSCOPY WITH MYOSURE;  Surgeon: Vena Austria, MD;  Location: [facility] ORS;  Service: Gynecology;  Laterality: N/A;   GUM SURGERY     laproscopy  2007   POLYPECTOMY     endometrial    Home Medications:  Allergies as of 01/12/2021       Reactions   Lac Bovis Other (See Comments)   Milk-related Compounds    Omeprazole Other (See Comments)   Pt reports "  Chest pain, HA and Dysuria."   Penicillins Itching, Rash        Medication List        Accurate as of January 12, 2021 11:59 PM. If you have any questions, ask your nurse or doctor.          Azelastine HCl 137 MCG/SPRAY Soln SMARTSIG:2 Spray(s) Both Nares Twice Daily PRN   CALCIUM 1200 PO Take 2-3 tablets by mouth daily. Reported on 05/26/2015   meloxicam 7.5 MG tablet Commonly known as:  MOBIC Take by mouth.   mometasone 50 MCG/ACT nasal spray Commonly known as: NASONEX 2 sprays daily.   nitrofurantoin (macrocrystal-monohydrate) 100 MG capsule Commonly known as: Macrobid Take 1 capsule (100 mg total) by mouth 2 (two) times daily. Take with food.   polyethylene glycol 17 g packet Commonly known as: MIRALAX / GLYCOLAX Take 17 g by mouth daily.   pregabalin 100 MG capsule Commonly known as: LYRICA Take 100 mg by mouth 2 (two) times daily.   psyllium 0.52 g capsule Commonly known as: REGULOID Take 0.52 g by mouth daily.        Allergies:  Allergies  Allergen Reactions   Lac Bovis Other (See Comments)   Milk-Related Compounds    Omeprazole Other (See Comments)    Pt reports "Chest pain, HA and Dysuria."   Penicillins Itching and Rash    Family History: Family History  Problem Relation Age of Onset   Colon cancer Father 20   Arthritis Father    Hyperlipidemia Father    Transient ischemic attack Father    Cancer - Colon Father 2   Prostate cancer Father 40   Valvular heart disease Father    Arthritis Mother    Heart disease Mother    Stroke Mother        TIA   Hypertension Mother    Diabetes Mother    Kidney cancer Mother 62   Heart failure Mother    Hyperlipidemia Mother    Cancer Maternal Grandmother    Cancer Paternal Grandfather        prostate   Breast cancer Cousin 30       has contact    Social History:  reports that she has never smoked. She has never used smokeless tobacco. She reports that she does not drink alcohol and does not use drugs.  ROS: Pertinent ROS in HPI  Physical Exam: BP (!) 147/82   Pulse 61   Ht 5\' 10"  (1.778 m)   Wt 142 lb (64.4 kg)   BMI 20.37 kg/m   Constitutional:  Well nourished. Alert and oriented, No acute distress. HEENT: [place] AT, mask in place.  Trachea midline Cardiovascular: No clubbing, cyanosis, or edema. Respiratory: Normal respiratory effort, no increased work of breathing. GU: No CVA  tenderness.  No bladder fullness or masses.  Normal external genitalia, normal pubic hair distribution, no lesions.  Normal urethral meatus, no lesions, no prolapse, no discharge.   No urethral masses, tenderness and/or tenderness. No bladder fullness, tenderness or masses. normal vagina mucosa, good estrogen effect, no discharge and no lesions.   Anus and perineum are without rashes or lesions.     Neurologic: Grossly intact, no focal deficits, moving all 4 extremities. Psychiatric: Normal mood and affect.    Laboratory Data: Lab Results  Component Value Date   WBC 6.4 12/30/2020   HGB 13.7 12/30/2020   HCT 40.0 12/30/2020   MCV 91.3 12/30/2020   PLT 204 12/30/2020    Lab  Results  Component Value Date   CREATININE 0.73 12/30/2020   Lab Results  Component Value Date   AST 20 12/30/2020   Lab Results  Component Value Date   ALT 18 12/30/2020    Urinalysis Component     Latest Ref Rng & Units 01/12/2021  Specific Gravity, UA     1.005 - 1.030 1.015  pH, UA     5.0 - 7.5 7.5  Color, UA     Yellow Yellow  Appearance Ur     Clear Clear  Leukocytes,UA     Negative Negative  Protein,UA     Negative/Trace Negative  Glucose, UA     Negative Negative  Ketones, UA     Negative Negative  RBC, UA     Negative Trace (A)  Bilirubin, UA     Negative Negative  Urobilinogen, Ur     0.2 - 1.0 mg/dL 0.2  Nitrite, UA     Negative Negative  Microscopic Examination      See below:   Component     Latest Ref Rng & Units 01/12/2021          WBC, UA     0 - 5 /hpf 0-5  RBC     0 - 2 /hpf 0-2  Epithelial Cells (non renal)     0 - 10 /hpf 0-10  Bacteria, UA     None seen/Few Few  I have reviewed the labs.   Pertinent Imaging: N/A   Procedure In and Out Catheterization Patient was cleaned and prepped in a sterile fashion with betadine . A 14 FR cath was inserted no complications were noted , 800 ml of urine return was noted, urine was yellow clear in color. A clean  urine sample was collected for urinalysis and urine culture. Bladder was drained  And catheter was removed with out difficulty.     Assessment & Plan:    1. Neurogenic bladder -completed PTNS and PT -managed with self cathing BID daily -Advised her to only drink 2 to 3, 16 ounce bottles of water daily and when she is thirsty drink more -We will have her increase self cathing to 3 times daily and record amounts for 1 week  2. High risk hematuria -work up x 2 NED, most recent imaging 2022 -reports of gross hematuria -UA negative for micro heme  3. rUTI's -Discussed with patient and her sister symptoms of urinary tract infection versus having colonization and that her pelvic pain and lower back pain may also be due to having a full bladder and constipation rather than urinary tract infections and the reason that she has positive urine cultures she is likely colonized -Discussed with patient that her incomplete bladder emptying is also contributing to her rUTI's and her symptoms of pelvic pain, so we will have her increase her CIC to three times days  -UA benign -urine sent for culture, will not treat unless she becomes symptomatic                                               Return for keep follow up in February .  These notes generated with voice recognition software. I apologize for typographical errors.  Michiel Cowboy, PA-C  [facility] [address]  [address] [place], [place] [address] [phone]

## 2021-01-12 ENCOUNTER — Ambulatory Visit (INDEPENDENT_AMBULATORY_CARE_PROVIDER_SITE_OTHER): Payer: BC Managed Care – PPO | Admitting: Urology

## 2021-01-12 ENCOUNTER — Other Ambulatory Visit: Payer: Self-pay

## 2021-01-12 ENCOUNTER — Encounter: Payer: Self-pay | Admitting: Urology

## 2021-01-12 VITALS — BP 147/82 | HR 61 | Ht 70.0 in | Wt 142.0 lb

## 2021-01-12 DIAGNOSIS — N319 Neuromuscular dysfunction of bladder, unspecified: Secondary | ICD-10-CM

## 2021-01-12 DIAGNOSIS — R319 Hematuria, unspecified: Secondary | ICD-10-CM

## 2021-01-12 DIAGNOSIS — R300 Dysuria: Secondary | ICD-10-CM

## 2021-01-12 DIAGNOSIS — N390 Urinary tract infection, site not specified: Secondary | ICD-10-CM

## 2021-01-13 ENCOUNTER — Telehealth: Payer: Self-pay | Admitting: Urology

## 2021-01-13 LAB — URINALYSIS, COMPLETE
Bilirubin, UA: NEGATIVE
Glucose, UA: NEGATIVE
Ketones, UA: NEGATIVE
Leukocytes,UA: NEGATIVE
Nitrite, UA: NEGATIVE
Protein,UA: NEGATIVE
Specific Gravity, UA: 1.015 (ref 1.005–1.030)
Urobilinogen, Ur: 0.2 mg/dL (ref 0.2–1.0)
pH, UA: 7.5 (ref 5.0–7.5)

## 2021-01-13 LAB — MICROSCOPIC EXAMINATION

## 2021-01-13 NOTE — Telephone Encounter (Signed)
Please let Cristina Wade know that the [employer] number I called would not let me leave a prescription.  I will need to send in a new form for her catheters.

## 2021-01-13 NOTE — Telephone Encounter (Signed)
.  left message to have patient return my call.  

## 2021-01-14 NOTE — Telephone Encounter (Signed)
Spoke with patient and advised results   

## 2021-01-18 LAB — CULTURE, URINE COMPREHENSIVE

## 2021-01-19 ENCOUNTER — Encounter: Payer: Self-pay | Admitting: Podiatry

## 2021-01-19 ENCOUNTER — Ambulatory Visit (INDEPENDENT_AMBULATORY_CARE_PROVIDER_SITE_OTHER): Payer: BC Managed Care – PPO | Admitting: Podiatry

## 2021-01-19 ENCOUNTER — Telehealth: Payer: Self-pay | Admitting: Family

## 2021-01-19 ENCOUNTER — Ambulatory Visit: Payer: BC Managed Care – PPO | Admitting: Podiatry

## 2021-01-19 ENCOUNTER — Other Ambulatory Visit: Payer: Self-pay

## 2021-01-19 DIAGNOSIS — M2170 Unequal limb length (acquired), unspecified site: Secondary | ICD-10-CM

## 2021-01-19 DIAGNOSIS — R2689 Other abnormalities of gait and mobility: Secondary | ICD-10-CM | POA: Diagnosis not present

## 2021-01-19 NOTE — Telephone Encounter (Signed)
Patient informed, Due to the high volume of calls and your symptoms we have to forward your call to our Triage Nurse to expedient your call. Please hold for the transfer.  Patient transferred to Access Nurse. Due to having bruising on her arms and thighs the past 2-3 days.No openings in office or virtual.

## 2021-01-19 NOTE — Telephone Encounter (Signed)
Left a message to call back.

## 2021-01-19 NOTE — Progress Notes (Signed)
She presents today to pick up her orthotics states that her feet still hurt she still has the numbness and tingling and but when she tried the orthotics and she stated that she can tell that it helped with her limb limb length discrepancy.  She was given both oral and written home-going structure for the care and breaking in of the orthotics I will follow-up with her in 1 month.

## 2021-01-20 ENCOUNTER — Emergency Department
Admission: EM | Admit: 2021-01-20 | Discharge: 2021-01-20 | Disposition: A | Discharge status: Home / Self Care | Payer: BC Managed Care – PPO | Attending: Emergency Medicine | Admitting: Emergency Medicine

## 2021-01-20 ENCOUNTER — Emergency Department: Payer: BC Managed Care – PPO

## 2021-01-20 ENCOUNTER — Other Ambulatory Visit: Payer: Self-pay

## 2021-01-20 DIAGNOSIS — R110 Nausea: Secondary | ICD-10-CM | POA: Insufficient documentation

## 2021-01-20 DIAGNOSIS — R42 Dizziness and giddiness: Secondary | ICD-10-CM | POA: Insufficient documentation

## 2021-01-20 DIAGNOSIS — J45909 Unspecified asthma, uncomplicated: Secondary | ICD-10-CM | POA: Insufficient documentation

## 2021-01-20 DIAGNOSIS — R519 Headache, unspecified: Secondary | ICD-10-CM | POA: Diagnosis not present

## 2021-01-20 DIAGNOSIS — R310 Gross hematuria: Secondary | ICD-10-CM | POA: Insufficient documentation

## 2021-01-20 LAB — COMPREHENSIVE METABOLIC PANEL
ALT: 22 U/L (ref 0–44)
AST: 24 U/L (ref 15–41)
Albumin: 4.2 g/dL (ref 3.5–5.0)
Alkaline Phosphatase: 52 U/L (ref 38–126)
Anion gap: 7 (ref 5–15)
BUN: 12 mg/dL (ref 6–20)
CO2: 26 mmol/L (ref 22–32)
Calcium: 9.2 mg/dL (ref 8.9–10.3)
Chloride: 104 mmol/L (ref 98–111)
Creatinine, Ser: 0.7 mg/dL (ref 0.44–1.00)
GFR, Estimated: >60 mL/min (ref >60)
Glucose, Bld: 96 mg/dL (ref 70–99)
Potassium: 3.3 mmol/L — ABNORMAL LOW (ref 3.5–5.1)
Sodium: 137 mmol/L (ref 135–145)
Total Bilirubin: 0.7 mg/dL (ref 0.3–1.2)
Total Protein: 6.8 g/dL (ref 6.5–8.1)

## 2021-01-20 LAB — URINALYSIS, COMPLETE (UACMP) WITH MICROSCOPIC
Bacteria, UA: NONE SEEN
Bilirubin Urine: NEGATIVE
Glucose, UA: NEGATIVE mg/dL
Hgb urine dipstick: NEGATIVE
Ketones, ur: NEGATIVE mg/dL
Nitrite: NEGATIVE
Protein, ur: NEGATIVE mg/dL
Specific Gravity, Urine: 1.002 — ABNORMAL LOW (ref 1.005–1.030)
pH: 7 (ref 5.0–8.0)

## 2021-01-20 LAB — CBC
HCT: 40.7 % (ref 36.0–46.0)
Hemoglobin: 13.3 g/dL (ref 12.0–15.0)
MCH: 30.1 pg (ref 26.0–34.0)
MCHC: 32.7 g/dL (ref 30.0–36.0)
MCV: 92.1 fL (ref 80.0–100.0)
Platelets: 200 10*3/uL (ref 150–400)
RBC: 4.42 MIL/uL (ref 3.87–5.11)
RDW: 13.2 % (ref 11.5–15.5)
WBC: 5.2 10*3/uL (ref 4.0–10.5)
nRBC: 0 % (ref 0.0–0.2)

## 2021-01-20 MED ORDER — SODIUM CHLORIDE 0.9 % IV BOLUS
1000.0000 mL | Freq: Once | INTRAVENOUS | Status: AC
  Administered 2021-01-20: 1000 mL via INTRAVENOUS

## 2021-01-20 MED ORDER — DIPHENHYDRAMINE HCL 50 MG/ML IJ SOLN
25.0000 mg | Freq: Once | INTRAMUSCULAR | Status: AC
  Administered 2021-01-20: 25 mg via INTRAVENOUS
  Filled 2021-01-20: qty 1

## 2021-01-20 MED ORDER — PROCHLORPERAZINE EDISYLATE 10 MG/2ML IJ SOLN
10.0000 mg | Freq: Once | INTRAMUSCULAR | Status: AC
  Administered 2021-01-20: 10 mg via INTRAVENOUS
  Filled 2021-01-20: qty 2

## 2021-01-20 NOTE — ED Provider Notes (Signed)
Stated  [facility] Emergency Department Provider Note   ____________________________________________   Event Date/Time   First MD Initiated Contact with Patient 01/20/21 1322     (approximate)  I have reviewed the triage vital signs and the nursing notes.   HISTORY  Chief Complaint Altered Mental Status    HPI Cristina Wade is a 50 y.o. female with past medical history of migraines, endometriosis, asthma, GERD, neuropathy, and neurogenic bladder who presents to the ED complaining of dizziness.  Patient reports that she was feeling fine when she went to bed around 10 PM last night, subsequently woke up this morning feeling dizzy and unsteady on her feet.  She states that her balance has been off and she feels like she is falling backwards at times.  She denies any associated vision changes, speech changes, numbness, or weakness.  She states she has had similar issues in the past that have been attributed to her peripheral neuropathy.  She does report associated headache and nausea today, but states this is different from her typical migraines.  She denies any fevers, cough, chest pain, shortness of breath, abdominal pain, or dysuria.        Past Medical History:  Diagnosis Date   Asthma    Chronic pelvic pain in female 2018   Cystitis    Cystocele    Endometrial polyp    Endometriosis    per pt report but never seen during surg   Frequent headaches    Gastritis    GERD (gastroesophageal reflux disease)    rare   Gross hematuria    History of kidney stones    Microscopic hematuria    Migraine    Migraines    Neuropathy    Osteopenia    Urinary disorder    UTI (lower urinary tract infection)     Patient Active Problem List   Diagnosis Date Noted   Dysuria 08/20/2020   Neurogenic bladder 08/20/2020   Pelvic pain in female 12/25/2019   Chronic low back pain 12/25/2019   Chronic venous insufficiency 12/24/2019   Lymphedema 12/24/2019   Lower  extremity edema 12/03/2019   HLD (hyperlipidemia) 10/10/2019   Low back pain radiating to left leg 10/04/2019   Dyspnea on exertion 09/20/2019   Orthopnea 09/20/2019   Chronic abdominal pain 05/14/2019   Irritable bowel syndrome with constipation 05/14/2019   Chronic constipation 12/25/2018   Urinary retention 08/12/2018   Anxiety 08/12/2018   Elevated blood pressure reading without diagnosis of hypertension 07/12/2018   Left leg pain 07/12/2018   Acute left-sided low back pain 06/03/2018   Cloudy urine 04/29/2018   Chest pain 04/29/2018   Frontal headache 04/27/2018   Localized, primary osteoarthritis 02/07/2018   Pelvic pain 01/30/2018   Slow transit constipation 10/10/2017   Bilateral impacted cerumen 11/28/2016   Chronic bilateral low back pain without sciatica 11/28/2016   Precordial pain 07/07/2016   Right bundle branch block (RBBB) on electrocardiogram (ECG) 07/07/2016   Chest tightness 06/27/2016   Preventative health care 09/30/2015   Encounter to establish care 09/28/2014   Vestibular migraine 09/28/2014   Routine physical examination 11/21/2013    Past Surgical History:  Procedure Laterality Date   COLONOSCOPY N/A 11/05/2014   Procedure: COLONOSCOPY;  Surgeon: Scot Jun, MD;  Location: [facility] ENDOSCOPY;  Service: Endoscopy;  Laterality: N/A;   COLONOSCOPY  11/2016   Dr. Mechele Collin   CYSTOSCOPY  2007   with biopsy    CYSTOSCOPY N/A 06/01/2016   Procedure:  CYSTOSCOPY;  Surgeon: Vena Austria, MD;  Location: [facility] ORS;  Service: Gynecology;  Laterality: N/A;   DIAGNOSTIC LAPAROSCOPY     DILATATION & CURETTAGE/HYSTEROSCOPY WITH MYOSURE N/A 04/13/2015   Procedure: DILATATION & CURETTAGE/HYSTEROSCOPY WITH MYOSURE/POLYPECTOMY;  Surgeon: Elenora Fender Ward, MD;  Location: [facility] ORS;  Service: Gynecology;  Laterality: N/A;   DILATATION & CURETTAGE/HYSTEROSCOPY WITH MYOSURE N/A 06/01/2016   Procedure: DILATATION & CURETTAGE/HYSTEROSCOPY WITH MYOSURE;  Surgeon: Vena Austria, MD;  Location: [facility] ORS;  Service: Gynecology;  Laterality: N/A;   GUM SURGERY     laproscopy  2007   POLYPECTOMY     endometrial    Prior to Admission medications   Medication Sig Start Date End Date Taking? Authorizing Provider  Azelastine HCl 137 MCG/SPRAY SOLN SMARTSIG:2 Spray(s) Both Nares Twice Daily PRN 07/21/19   [provider]  Calcium Carbonate-Vit D-Min (CALCIUM 1200 PO) Take 2-3 tablets by mouth daily. Reported on 05/26/2015    [provider]  mometasone (NASONEX) 50 MCG/ACT nasal spray 2 sprays daily. 06/26/20   [provider]  nitrofurantoin, macrocrystal-monohydrate, (MACROBID) 100 MG capsule Take 1 capsule (100 mg total) by mouth 2 (two) times daily. Take with food. 12/29/20   [name], FNP  polyethylene glycol (MIRALAX / GLYCOLAX) 17 g packet Take 17 g by mouth daily.    [provider]  pregabalin (LYRICA) 100 MG capsule Take 100 mg by mouth 2 (two) times daily.    [provider]  psyllium (REGULOID) 0.52 g capsule Take 0.52 g by mouth daily.    [provider]    Allergies Lac bovis, Milk-related compounds, Omeprazole, and Penicillins  Family History  Problem Relation Age of Onset   Colon cancer Father 79   Arthritis Father    Hyperlipidemia Father    Transient ischemic attack Father    Cancer - Colon Father 67   Prostate cancer Father 53   Valvular heart disease Father    Arthritis Mother    Heart disease Mother    Stroke Mother        TIA   Hypertension Mother    Diabetes Mother    Kidney cancer Mother 53   Heart failure Mother    Hyperlipidemia Mother    Cancer Maternal Grandmother    Cancer Paternal Grandfather        prostate   Breast cancer Cousin 62       has contact    Social History Social History   Tobacco Use   Smoking status: Never   Smokeless tobacco: Never  Vaping Use   Vaping Use: Never used  Substance Use Topics   Alcohol use: No   Drug use: No     Review of Systems  Constitutional: No fever/chills Eyes: No visual changes. ENT: No sore throat. Cardiovascular: Denies chest pain. Respiratory: Denies shortness of breath. Gastrointestinal: No abdominal pain.  No nausea, no vomiting.  No diarrhea.  No constipation. Genitourinary: Negative for dysuria. Musculoskeletal: Negative for back pain. Skin: Negative for rash. Neurological: Negative for headaches, focal weakness or numbness.  Positive for dizziness and unsteadiness.  ____________________________________________   PHYSICAL EXAM:  VITAL SIGNS: ED Triage Vitals  Enc Vitals Group     BP 01/20/21 1205 119/80     Pulse Rate 01/20/21 1205 72     Resp 01/20/21 1205 18     Temp 01/20/21 1205 97.9 F (36.6 C)     Temp Source 01/20/21 1205 Oral     SpO2 01/20/21 1205 98 %  Weight 01/20/21 1206 142 lb (64.4 kg)     Height 01/20/21 1206 5\' 10"  (1.778 m)     Head Circumference --      Peak Flow --      Pain Score 01/20/21 1206 3     Pain Loc --      Pain Edu? --      Excl. in GC? --     Constitutional: Alert and oriented. Eyes: Conjunctivae are normal. Head: Atraumatic. Nose: No congestion/rhinnorhea. Mouth/Throat: Mucous membranes are moist. Neck: Normal ROM Cardiovascular: Normal rate, regular rhythm. Grossly normal heart sounds.  2+ radial pulses bilaterally. Respiratory: Normal respiratory effort.  No retractions. Lungs CTAB. Gastrointestinal: Soft and nontender. No distention. Genitourinary: deferred Musculoskeletal: No lower extremity tenderness nor edema. Neurologic:  Normal speech and language. No gross focal neurologic deficits are appreciated.  Ambulates with steady gait. Skin:  Skin is warm, dry and intact. No rash noted. Psychiatric: Mood and affect are normal. Speech and behavior are normal.  ____________________________________________   LABS (all labs ordered are listed, but only abnormal results are displayed)  Labs Reviewed  COMPREHENSIVE  METABOLIC PANEL - Abnormal; Notable for the following components:      Result Value   Potassium 3.3 (*)    All other components within normal limits  URINALYSIS, COMPLETE (UACMP) WITH MICROSCOPIC - Abnormal; Notable for the following components:   Color, Urine STRAW (*)    APPearance CLEAR (*)    Specific Gravity, Urine 1.002 (*)    Leukocytes,Ua LARGE (*)    All other components within normal limits  URINE CULTURE  CBC  CBG MONITORING, ED  POC URINE PREG, ED  CBG MONITORING, ED  POC URINE PREG, ED   ____________________________________________  EKG  ED ECG REPORT I, 03/22/21, the attending physician, personally viewed and interpreted this ECG.   Date: 01/20/2021  EKG Time: 12:06  Rate: 73  Rhythm: normal sinus rhythm  Axis: RAD  Intervals:none  ST&T Change: None   PROCEDURES  Procedure(s) performed (including Critical Care):  Procedures   ____________________________________________   INITIAL IMPRESSION / ASSESSMENT AND PLAN / ED COURSE      50 year old female with past medical history of migraines, endometriosis, asthma, GERD, peripheral neuropathy, and neurogenic bladder who presents to the ED with symptoms of dizziness and difficulties with balance upon waking up this morning.  She has no focal neurologic deficits on exam and I doubt acute stroke but we will check CT head for other possible etiology of her dizziness.  She is able to ambulate with a steady gait.  EKG shows no evidence of arrhythmia or ischemia and labs are unremarkable.  UA does appear borderline for UTI however patient denies any symptoms of UTI.  We will hold off on treatment and send for culture.  She does complain of headache and nausea with a history of migraines, presentation could be related to complex migraine and we will treat with migraine cocktail.  CT head is negative for acute process, patient has ambulated back and forth to the bathroom on multiple occasions gait and dizziness  seems to be improving.  I have very low suspicion for stroke at this time and given reassuring work-up she is appropriate for discharge home with PCP and neurology follow-up.  She was counseled to return to the ED for new worsening symptoms, patient agrees with plan.      ____________________________________________   FINAL CLINICAL IMPRESSION(S) / ED DIAGNOSES  Final diagnoses:  Dizziness     ED  Discharge Orders     None        Note:  This document was prepared using Dragon voice recognition software and may include unintentional dictation errors.    Chesley Noon, MD 01/20/21 [phone]

## 2021-01-20 NOTE — Telephone Encounter (Signed)
Called and spoke with Cristina Wade. Cristina Wade states that she has been to [facility] walk in urgent care to be evaluated for this issue. She states that she was told that the issue comes from her Meloxicam medication. Sent to PCP as Lorain Childes

## 2021-01-20 NOTE — ED Triage Notes (Signed)
Pt states she woke up this morning around 2-3 am and felt cold. Pt went back to sleep and woke up around 630 am at tried to get out of bed and was stumbling and falling into the wall. Pt denies falling down. Pt with twin sister, sister states LKW last night at 10 pm. Pt states legs feel weak, states she has neuropathy. Pt denies one sided weakness, confusion, speech changes. Pt states she is "disoriented", after further discussion, pt states she is not confused but is stumbling and dropping things.

## 2021-01-21 NOTE — Telephone Encounter (Signed)
Noted, seen in ED yesterday

## 2021-01-22 LAB — URINE CULTURE

## 2021-01-24 ENCOUNTER — Telehealth: Payer: Self-pay

## 2021-01-24 NOTE — Telephone Encounter (Signed)
Pt walked into clinic today requesting samples of catheters until she receives her shipment. Pt given samples of 14FR female catheters. While in clinic pt also questioned the results of her urine cx from recent ED visit which grew multiple species. Per Michiel Cowboy, PA-C informed patient that she just had a negative urine cx from cath UA x 1 week ago. Advised pt that it is unlikely she has an infection. Pt c/o bladder pain, however states she has only catheterized once today. PVR . Strongly encouraged pt to cath TID, encouraged her to set an alarm on cell phone to help. Pt voiced understanding. Provider made aware and in agreement with plan.

## 2021-01-27 ENCOUNTER — Ambulatory Visit: Payer: BC Managed Care – PPO | Admitting: Urology

## 2021-01-28 ENCOUNTER — Ambulatory Visit (INDEPENDENT_AMBULATORY_CARE_PROVIDER_SITE_OTHER): Payer: BC Managed Care – PPO | Admitting: Urology

## 2021-01-28 ENCOUNTER — Encounter: Payer: Self-pay | Admitting: Urology

## 2021-01-28 ENCOUNTER — Other Ambulatory Visit: Payer: Self-pay

## 2021-01-28 VITALS — BP 127/73 | HR 1 | Ht 68.0 in | Wt 142.0 lb

## 2021-01-28 DIAGNOSIS — R319 Hematuria, unspecified: Secondary | ICD-10-CM

## 2021-01-28 DIAGNOSIS — N319 Neuromuscular dysfunction of bladder, unspecified: Secondary | ICD-10-CM | POA: Diagnosis not present

## 2021-01-28 DIAGNOSIS — N390 Urinary tract infection, site not specified: Secondary | ICD-10-CM | POA: Diagnosis not present

## 2021-01-28 LAB — URINALYSIS, COMPLETE
Bilirubin, UA: NEGATIVE
Glucose, UA: NEGATIVE
Ketones, UA: NEGATIVE
Nitrite, UA: NEGATIVE
Protein,UA: NEGATIVE
RBC, UA: NEGATIVE
Specific Gravity, UA: 1.01 (ref 1.005–1.030)
Urobilinogen, Ur: 0.2 mg/dL (ref 0.2–1.0)
pH, UA: 6.5 (ref 5.0–7.5)

## 2021-01-28 LAB — MICROSCOPIC EXAMINATION: RBC, Urine: NONE SEEN /hpf (ref 0–2)

## 2021-01-28 NOTE — Progress Notes (Signed)
01/28/2021 7:24 PM   Cristina Wade 1970/04/30 [id number]  Referring provider: [name], FNP [address] [place],  [place] [address]  Chief Complaint  Patient presents with   Recurrent UTI     Urological history: 1. High risk hematuria -non-smoker -work up 2015 -bilateral punctate stones -cysto 2019 NED -no reports of gross hematuria -UA negative for micro heme  2. Neurogenic bladder -UDS 2017 indicating nonobstructive urinary retention with poor bladder sensation and external sphincter dyssynergia -completed 12 weeks of PTNS -completed pelvic floor PT -manages with self cathing three times daily  3. Nephrolithiasis -non-contrast CT 2022 2 mm stone in the right kidney lower pole without hydronephrosis.  Three tiny stones in left kidney lower pole region without hydronephrosis  4. rUTI's -contributing factors of age, incomplete emptying, vaginal atrophy, chronic constipation,  -documented positive urine cultures over the last year  03/06/2020 Klebsiella pneumoniae  08/20/2020 citrobacter freundii  09/18/2020 proteus mirabilis  10/21/2020 citrobacter freundii  12/07/2020 staph epidermis/B-hemolytic strep  12/24/2020 Klebsiella pneumoniae  12/30/2020 Klebsiella pneumoniae/B-hemolytic strep  01/20/2021 multiple species      HPI: Cristina Wade is a 50 y.o. female who presents today for possible UTI.    CATH UA 6-10 WBC's and moderate bacteria.   She is cathing three times daily with PVR's of 300 cc to 400 cc.  She is having lower abdomen pain and lower back pain.  These pains occur every two to three days.  She is also continuing with constipation.  Patient denies any modifying or aggravating factors.  Patient denies any gross hematuria, dysuria or suprapubic/flank pain.  Patient denies any fevers, chills, nausea or vomiting.      PMH: Past Medical History:  Diagnosis Date   Asthma    Chronic pelvic pain in female 2018   Cystitis     Cystocele    Endometrial polyp    Endometriosis    per pt report but never seen during surg   Frequent headaches    Gastritis    GERD (gastroesophageal reflux disease)    rare   Gross hematuria    History of kidney stones    Microscopic hematuria    Migraine    Migraines    Neuropathy    Osteopenia    Urinary disorder    UTI (lower urinary tract infection)     Surgical History: Past Surgical History:  Procedure Laterality Date   COLONOSCOPY N/A 11/05/2014   Procedure: COLONOSCOPY;  Surgeon: Scot Jun, MD;  Location: [facility] ENDOSCOPY;  Service: Endoscopy;  Laterality: N/A;   COLONOSCOPY  11/2016   Dr. Mechele Collin   CYSTOSCOPY  2007   with biopsy    CYSTOSCOPY N/A 06/01/2016   Procedure: CYSTOSCOPY;  Surgeon: Vena Austria, MD;  Location: [facility] ORS;  Service: Gynecology;  Laterality: N/A;   DIAGNOSTIC LAPAROSCOPY     DILATATION & CURETTAGE/HYSTEROSCOPY WITH MYOSURE N/A 04/13/2015   Procedure: DILATATION & CURETTAGE/HYSTEROSCOPY WITH MYOSURE/POLYPECTOMY;  Surgeon: Elenora Fender Ward, MD;  Location: [facility] ORS;  Service: Gynecology;  Laterality: N/A;   DILATATION & CURETTAGE/HYSTEROSCOPY WITH MYOSURE N/A 06/01/2016   Procedure: DILATATION & CURETTAGE/HYSTEROSCOPY WITH MYOSURE;  Surgeon: Vena Austria, MD;  Location: [facility] ORS;  Service: Gynecology;  Laterality: N/A;   GUM SURGERY     laproscopy  2007   POLYPECTOMY     endometrial    Home Medications:  Allergies as of 01/28/2021       Reactions   Lac Bovis Other (See Comments)   Milk-related  Compounds    Omeprazole Other (See Comments)   Pt reports "Chest pain, HA and Dysuria."   Penicillins Itching, Rash        Medication List        Accurate as of January 28, 2021 11:59 PM. If you have any questions, ask your nurse or doctor.          Azelastine HCl 137 MCG/SPRAY Soln SMARTSIG:2 Spray(s) Both Nares Twice Daily PRN   CALCIUM 1200 PO Take 2-3 tablets by mouth daily. Reported on 05/26/2015   mometasone 50  MCG/ACT nasal spray Commonly known as: NASONEX 2 sprays daily.   nitrofurantoin (macrocrystal-monohydrate) 100 MG capsule Commonly known as: Macrobid Take 1 capsule (100 mg total) by mouth 2 (two) times daily. Take with food.   polyethylene glycol 17 g packet Commonly known as: MIRALAX / GLYCOLAX Take 17 g by mouth daily.   pregabalin 100 MG capsule Commonly known as: LYRICA Take 100 mg by mouth 2 (two) times daily.   psyllium 0.52 g capsule Commonly known as: REGULOID Take 0.52 g by mouth daily.        Allergies:  Allergies  Allergen Reactions   Lac Bovis Other (See Comments)   Milk-Related Compounds    Omeprazole Other (See Comments)    Pt reports "Chest pain, HA and Dysuria."   Penicillins Itching and Rash    Family History: Family History  Problem Relation Age of Onset   Colon cancer Father 72   Arthritis Father    Hyperlipidemia Father    Transient ischemic attack Father    Cancer - Colon Father 40   Prostate cancer Father 27   Valvular heart disease Father    Arthritis Mother    Heart disease Mother    Stroke Mother        TIA   Hypertension Mother    Diabetes Mother    Kidney cancer Mother 15   Heart failure Mother    Hyperlipidemia Mother    Cancer Maternal Grandmother    Cancer Paternal Grandfather        prostate   Breast cancer Cousin 54       has contact    Social History:  reports that she has never smoked. She has never used smokeless tobacco. She reports that she does not drink alcohol and does not use drugs.  ROS: Pertinent ROS in HPI  Physical Exam: BP 127/73   Pulse (!) 1   Ht 5\' 8"  (1.727 m)   Wt 142 lb (64.4 kg)   BMI 21.59 kg/m   Constitutional:  Well nourished. Alert and oriented, No acute distress. HEENT:  AT, mask in place.  Trachea midline Cardiovascular: No clubbing, cyanosis, or edema. Respiratory: Normal respiratory effort, no increased work of breathing. Neurologic: Grossly intact, no focal deficits, moving all  4 extremities. Psychiatric: Normal mood and affect.    Laboratory Data: Lab Results  Component Value Date   WBC 5.2 01/20/2021   HGB 13.3 01/20/2021   HCT 40.7 01/20/2021   MCV 92.1 01/20/2021   PLT 200 01/20/2021    Lab Results  Component Value Date   CREATININE 0.70 01/20/2021   Lab Results  Component Value Date   AST 24 01/20/2021   Lab Results  Component Value Date   ALT 22 01/20/2021   Urinalysis Component     Latest Ref Rng & Units 01/28/2021  Specific Gravity, UA     1.010 - 1.025 1.010  pH, UA     5.0 -  8.0 6.5  Color, UA     yellow Yellow  Appearance Ur     Clear Clear  Leukocytes,UA     Negative 1+ (A)  Protein,UA     negative mg/dL Negative  Glucose, UA     Negative Negative  Ketones, UA     negative mg/dL Negative  RBC, UA     negative Negative  Bilirubin, UA     negative Negative  Urobilinogen, Ur     0.2 - 1.0 mg/dL 0.2  Nitrite, UA     Negative Negative  Microscopic Examination      See below:   Component     Latest Ref Rng & Units 01/28/2021          WBC, UA     0 - 5 /hpf 6-10 (A)  RBC     0 - 2 /hpf None seen  Epithelial Cells (non renal)     0 - 10 /hpf 0-10  Bacteria, UA     None seen/Few Moderate (A)  I have reviewed the labs.   Pertinent Imaging: N/A  In and Out Catheterization  Patient is present today for a I & O catheterization due to rUTI's. Patient was cleaned and prepped in a sterile fashion with betadine . A 14 FR cath was inserted no complications were noted , 300 ml of urine return was noted, urine was yellow clear in color. A clean urine sample was collected for UA and urine culture. Bladder was drained  And catheter was removed with out difficulty.    Performed by: Ples Specter, CMA    Assessment & Plan:    1. Neurogenic bladder -continue to CIC x 3 times daily -advised her to self cath when she feels lower abdominal pain  2. High risk hematuria -work up x 2 NED, most recent imaging  2022 -reports of gross hematuria -UA negative for micro heme  3. rUTI's -discussed with patient the risk of taking antibiotics when she is not actively infected -UA -urine sent for culture in case she should develop symptoms of an UTI (dysuria, gross hematuria, fever, chills, nausea, vomiting, suprapubic/lower back pain that persists after self cathing                                             Return for Keep appointment in 06/10/2021.  These notes generated with voice recognition software. I apologize for typographical errors.  Michiel Cowboy, PA-C  [facility] [address]  [address] [place], [place] [address] [phone]

## 2021-01-28 NOTE — Progress Notes (Signed)
In and Out Catheterization  Patient is present today for a I & O catheterization due to cath ua. Patient was cleaned and prepped in a sterile fashion with betadine . A 14FR cath was inserted no complications were noted , of urine return was noted, urine was yellow in color. A clean urine sample was collected for UA. Bladder was drained  And catheter was removed with out difficulty.    Performed by: Milas Kocher, CMA

## 2021-01-31 LAB — CULTURE, URINE COMPREHENSIVE

## 2021-02-07 ENCOUNTER — Telehealth: Payer: Self-pay | Admitting: *Deleted

## 2021-02-07 NOTE — Telephone Encounter (Signed)
Patient left a voice mail and states she had blood in her urine and she went to a walk in over the weekend and they are treating her for UTI.

## 2021-02-09 ENCOUNTER — Ambulatory Visit: Payer: BC Managed Care – PPO

## 2021-02-09 ENCOUNTER — Ambulatory Visit: Payer: BC Managed Care – PPO | Admitting: Podiatry

## 2021-02-09 ENCOUNTER — Encounter: Payer: Self-pay | Admitting: Podiatry

## 2021-02-09 ENCOUNTER — Telehealth: Payer: Self-pay | Admitting: Urology

## 2021-02-09 ENCOUNTER — Ambulatory Visit (INDEPENDENT_AMBULATORY_CARE_PROVIDER_SITE_OTHER): Payer: BC Managed Care – PPO | Admitting: Podiatry

## 2021-02-09 ENCOUNTER — Other Ambulatory Visit: Payer: Self-pay

## 2021-02-09 DIAGNOSIS — S9032XA Contusion of left foot, initial encounter: Secondary | ICD-10-CM

## 2021-02-09 NOTE — Progress Notes (Signed)
She presents today at the request of her primary care provider for painful toe second digit left foot this been injured back in May 2022.  Radiographs were recently done which demonstrated a fracture to the proximal phalanx.  Objective: Vital signs are stable alert oriented x3.  There is no erythema edema cellulitis drainage or odor no reproducible pain on palpation.  Radiographs taken in the office once again reveal a healing proximal phalanx is none comminuted noncompressed not dislocated.  Assessment well-healing fracture second toe.  Plan: Follow-up with [place] on an as-needed basis.

## 2021-02-09 NOTE — Telephone Encounter (Signed)
Patient was seen at urgent care for hematuria and possible UTI.  She states that she is currently on an antibiotic.  She has complaint today of bladder and kidney pain.    Please advise on how to proceed.

## 2021-02-10 NOTE — Telephone Encounter (Signed)
Spoke with patient and advised results, she will finish abx

## 2021-02-10 NOTE — Telephone Encounter (Signed)
Michiel Cowboy A, PA-C  You 7 minutes ago (9:48 AM)   She needs to complete the antibiotic and continue to cath three times daily.

## 2021-02-14 ENCOUNTER — Telehealth: Payer: Self-pay | Admitting: Family Medicine

## 2021-02-14 ENCOUNTER — Ambulatory Visit
Admission: EM | Admit: 2021-02-14 | Discharge: 2021-02-14 | Disposition: A | Discharge status: Home / Self Care | Payer: BC Managed Care – PPO | Attending: Emergency Medicine | Admitting: Emergency Medicine

## 2021-02-14 ENCOUNTER — Other Ambulatory Visit: Payer: Self-pay

## 2021-02-14 ENCOUNTER — Telehealth: Payer: Self-pay | Admitting: Family

## 2021-02-14 DIAGNOSIS — Z20822 Contact with and (suspected) exposure to covid-19: Secondary | ICD-10-CM | POA: Insufficient documentation

## 2021-02-14 DIAGNOSIS — R1030 Lower abdominal pain, unspecified: Secondary | ICD-10-CM | POA: Diagnosis not present

## 2021-02-14 DIAGNOSIS — R0602 Shortness of breath: Secondary | ICD-10-CM | POA: Insufficient documentation

## 2021-02-14 DIAGNOSIS — R079 Chest pain, unspecified: Secondary | ICD-10-CM | POA: Diagnosis present

## 2021-02-14 LAB — POCT URINALYSIS DIP (MANUAL ENTRY)
Bilirubin, UA: NEGATIVE
Glucose, UA: NEGATIVE mg/dL
Ketones, POC UA: NEGATIVE mg/dL
Nitrite, UA: NEGATIVE
Protein Ur, POC: NEGATIVE mg/dL
Spec Grav, UA: 1.01 (ref 1.010–1.025)
Urobilinogen, UA: 0.2 U/dL
pH, UA: 7 (ref 5.0–8.0)

## 2021-02-14 NOTE — ED Triage Notes (Signed)
Patient presents to Urgent Care with complaints of chest pain, SOB, headache x 1 week. She states she has had on-going abdominal pressure since her last visit 10/26 at [facility] treated with antibiotics with no improvement. She states she has a hx of UTIs and sees urology for. She also concerned with COVID had an exposure 2 weeks ago.   Denies fever, hematuria, n/v.

## 2021-02-14 NOTE — Telephone Encounter (Signed)
Patient called and states she is still having bladder pain and kidney pain after finishing the ABX. She also states she is having headache and chest pain. I spoke to [facility] and she wants patient to get a COVID test as chest pain and headache are not UTI symptoms. If the test in a clinic is negative then we will address the urinary symptoms. Patient voiced understanding and will call when she has her results.

## 2021-02-14 NOTE — Telephone Encounter (Signed)
Patient is experiencing severe back pain, chest and head in sever pain as well. Needs to be tested for covid. Sending to access nurse.

## 2021-02-14 NOTE — Discharge Instructions (Addendum)
Go to the emergency department if you have chest pain, shortness of breath, or other concerning symptoms.    Follow-up with your primary care provider tomorrow.

## 2021-02-14 NOTE — ED Provider Notes (Signed)
Renaldo Fiddler    CSN: [id number] Arrival date & time: 02/14/21  1652      History   Chief Complaint Chief Complaint  Patient presents with   Abdominal Pain   Shortness of Breath   Chest Pain    HPI Cristina Wade is a 50 y.o. female.  Patient presents with 1 week history of intermittent headache, chest pain, shortness of breath, bladder pressure.  She denies current chest pain or shortness of breath.  No fever, ear pain, sore throat, cough, vomiting, diarrhea, constipation, or other symptoms.  She was seen at [facility] clinic on 02/05/2021; diagnosed with recurrent UTI and treated with Cipro.  She self caths 3 times a day for urine and is followed by urology.  Her medical history includes right bundle branch block, asthma, migraine headaches, chronic back pain, chronic abdominal pain, chronic constipation, neurogenic bladder.  The history is provided by the patient and medical records.   Past Medical History:  Diagnosis Date   Asthma    Chronic pelvic pain in female 2018   Cystitis    Cystocele    Endometrial polyp    Endometriosis    per pt report but never seen during surg   Frequent headaches    Gastritis    GERD (gastroesophageal reflux disease)    rare   Gross hematuria    History of kidney stones    Microscopic hematuria    Migraine    Migraines    Neuropathy    Osteopenia    Urinary disorder    UTI (lower urinary tract infection)     Patient Active Problem List   Diagnosis Date Noted   Dysuria 08/20/2020   Neurogenic bladder 08/20/2020   Pelvic pain in female 12/25/2019   Chronic low back pain 12/25/2019   Chronic venous insufficiency 12/24/2019   Lymphedema 12/24/2019   Lower extremity edema 12/03/2019   HLD (hyperlipidemia) 10/10/2019   Low back pain radiating to left leg 10/04/2019   Dyspnea on exertion 09/20/2019   Orthopnea 09/20/2019   Chronic abdominal pain 05/14/2019   Irritable bowel syndrome with constipation 05/14/2019   Chronic  constipation 12/25/2018   Urinary retention 08/12/2018   Anxiety 08/12/2018   Elevated blood pressure reading without diagnosis of hypertension 07/12/2018   Left leg pain 07/12/2018   Acute left-sided low back pain 06/03/2018   Cloudy urine 04/29/2018   Chest pain 04/29/2018   Frontal headache 04/27/2018   Localized, primary osteoarthritis 02/07/2018   Pelvic pain 01/30/2018   Slow transit constipation 10/10/2017   Bilateral impacted cerumen 11/28/2016   Chronic bilateral low back pain without sciatica 11/28/2016   Precordial pain 07/07/2016   Right bundle branch block (RBBB) on electrocardiogram (ECG) 07/07/2016   Chest tightness 06/27/2016   Preventative health care 09/30/2015   Encounter to establish care 09/28/2014   Vestibular migraine 09/28/2014   Routine physical examination 11/21/2013    Past Surgical History:  Procedure Laterality Date   COLONOSCOPY N/A 11/05/2014   Procedure: COLONOSCOPY;  Surgeon: Scot Jun, MD;  Location: [facility] ENDOSCOPY;  Service: Endoscopy;  Laterality: N/A;   COLONOSCOPY  11/2016   Dr. Mechele Collin   CYSTOSCOPY  2007   with biopsy    CYSTOSCOPY N/A 06/01/2016   Procedure: CYSTOSCOPY;  Surgeon: Vena Austria, MD;  Location: [facility] ORS;  Service: Gynecology;  Laterality: N/A;   DIAGNOSTIC LAPAROSCOPY     DILATATION & CURETTAGE/HYSTEROSCOPY WITH MYOSURE N/A 04/13/2015   Procedure: DILATATION & CURETTAGE/HYSTEROSCOPY WITH MYOSURE/POLYPECTOMY;  Surgeon: Leeroy Bock  C Ward, MD;  Location: [facility] ORS;  Service: Gynecology;  Laterality: N/A;   DILATATION & CURETTAGE/HYSTEROSCOPY WITH MYOSURE N/A 06/01/2016   Procedure: DILATATION & CURETTAGE/HYSTEROSCOPY WITH MYOSURE;  Surgeon: Malachy Mood, MD;  Location: [facility] ORS;  Service: Gynecology;  Laterality: N/A;   GUM SURGERY     laproscopy  2007   POLYPECTOMY     endometrial    OB History     Gravida  0   Para  0   Term  0   Preterm  0   AB  0   Living  0      SAB  0   IAB  0   Ectopic   0   Multiple  0   Live Births  0            Home Medications    Prior to Admission medications   Medication Sig Start Date End Date Taking? Authorizing Provider  Azelastine HCl 137 MCG/SPRAY SOLN SMARTSIG:2 Spray(s) Both Nares Twice Daily PRN 07/21/19   [provider]  Calcium Carbonate-Vit D-Min (CALCIUM 1200 PO) Take 2-3 tablets by mouth daily. Reported on 05/26/2015    [provider]  ciprofloxacin (CIPRO) 500 MG tablet Take 500 mg by mouth 2 (two) times daily. 02/05/21   [provider]  mometasone (NASONEX) 50 MCG/ACT nasal spray 2 sprays daily. 06/26/20   [provider]  nitrofurantoin, macrocrystal-monohydrate, (MACROBID) 100 MG capsule Take 1 capsule (100 mg total) by mouth 2 (two) times daily. Take with food. 12/29/20   Burnard Hawthorne, FNP  polyethylene glycol (MIRALAX / GLYCOLAX) 17 g packet Take 17 g by mouth daily.    [provider]  pregabalin (LYRICA) 100 MG capsule Take 100 mg by mouth 2 (two) times daily.    [provider]  psyllium (REGULOID) 0.52 g capsule Take 0.52 g by mouth daily.    [provider]    Family History Family History  Problem Relation Age of Onset   Colon cancer Father 34   Arthritis Father    Hyperlipidemia Father    Transient ischemic attack Father    Cancer - Colon Father 45   Prostate cancer Father 93   Valvular heart disease Father    Arthritis Mother    Heart disease Mother    Stroke Mother        TIA   Hypertension Mother    Diabetes Mother    Kidney cancer Mother 29   Heart failure Mother    Hyperlipidemia Mother    Cancer Maternal Grandmother    Cancer Paternal Grandfather        prostate   Breast cancer Cousin 8       has contact    Social History Social History   Tobacco Use   Smoking status: Never   Smokeless tobacco: Never  Vaping Use   Vaping Use: Never used  Substance Use Topics   Alcohol use: No   Drug use: No     Allergies   Lac  bovis, Milk-related compounds, Omeprazole, and Penicillins   Review of Systems Review of Systems  Constitutional:  Negative for chills and fever.  HENT:  Negative for ear pain and sore throat.   Respiratory:  Positive for shortness of breath. Negative for cough.   Cardiovascular:  Positive for chest pain. Negative for palpitations.  Gastrointestinal:  Positive for abdominal pain. Negative for constipation, diarrhea and vomiting.  Genitourinary:  Negative for dysuria and hematuria.  Skin:  Negative for color change and rash.  Neurological:  Positive for headaches. Negative for dizziness and syncope.  All other systems reviewed and are negative.   Physical Exam Triage Vital Signs ED Triage Vitals  Enc Vitals Group     BP      Pulse      Resp      Temp      Temp src      SpO2      Weight      Height      Head Circumference      Peak Flow      Pain Score      Pain Loc      Pain Edu?      Excl. in [place]?    No data found.  Updated Vital Signs BP 131/80 (BP Location: Left Arm)   Pulse 74   Temp 97.7 F (36.5 C) (Temporal)   Resp 16   SpO2 100%   Visual Acuity Right Eye Distance:   Left Eye Distance:   Bilateral Distance:    Right Eye Near:   Left Eye Near:    Bilateral Near:     Physical Exam Vitals and nursing note reviewed.  Constitutional:      General: She is not in acute distress.    Appearance: She is well-developed. She is not ill-appearing.  HENT:     Head: Normocephalic and atraumatic.     Right Ear: Tympanic membrane normal.     Left Ear: Tympanic membrane normal.     Nose: Nose normal.     Mouth/Throat:     Mouth: Mucous membranes are moist.     Pharynx: Oropharynx is clear.  Eyes:     Conjunctiva/sclera: Conjunctivae normal.  Cardiovascular:     Rate and Rhythm: Normal rate and regular rhythm.     Heart sounds: Normal heart sounds.  Pulmonary:     Effort: Pulmonary effort is normal. No respiratory distress.     Breath sounds: Normal breath  sounds.  Abdominal:     General: Bowel sounds are normal.     Palpations: Abdomen is soft.     Tenderness: There is no abdominal tenderness. There is no right CVA tenderness, left CVA tenderness, guarding or rebound.  Musculoskeletal:     Cervical back: Neck supple.  Skin:    General: Skin is warm and dry.  Neurological:     General: No focal deficit present.     Mental Status: She is alert.  Psychiatric:        Mood and Affect: Mood normal.        Behavior: Behavior normal.     UC Treatments / Results  Labs (all labs ordered are listed, but only abnormal results are displayed) Labs Reviewed  POCT URINALYSIS DIP (MANUAL ENTRY) - Abnormal; Notable for the following components:      Result Value   Blood, UA trace-lysed (*)    Leukocytes, UA Trace (*)    All other components within normal limits  NOVEL CORONAVIRUS, NAA    EKG   Radiology No results found.  Procedures Procedures (including critical care time)  Medications Ordered in UC Medications - No data to display  Initial Impression / Assessment and Plan / UC Course  I have reviewed the triage vital signs and the nursing notes.  Pertinent labs & imaging results that were available during my care of the patient were reviewed by me and considered in my medical decision making (see chart  for details).  Lower abdominal pain, chest pain, shortness of breath.  Patient declines transfer to the ED.  She is well-appearing and her exam is reassuring.  She does not have chest pain or shortness of breath at this time.  EKG shows sinus rhythm, rate 68, no ST elevation, compared to previous from 01/20/2021.  Discussed limitations of evaluation in an urgent care setting.  Instructed her to go to the ED if she has shortness of breath, chest pain, or other concerning symptoms.  Instructed her to follow-up with her PCP tomorrow.  She agrees to plan of care.   Final Clinical Impressions(s) / UC Diagnoses   Final diagnoses:  Exposure  to COVID-19 virus  Lower abdominal pain  Chest pain, unspecified type  Shortness of breath     Discharge Instructions      Go to the emergency department if you have chest pain, shortness of breath, or other concerning symptoms.    Follow-up with your primary care provider tomorrow.         ED Prescriptions   None    PDMP not reviewed this encounter.   Sharion Balloon, NP 02/14/21 1820

## 2021-02-14 NOTE — Telephone Encounter (Signed)
LMTCB to  make sure patient is going to be seen today.

## 2021-02-14 NOTE — Telephone Encounter (Signed)
I spoke with patient's sister [name], she stated that patient was at [employer] being tested for Covid. She stated that her sister had these symptoms before where her "heart hurt". I advised that while this could be Covid related if she was positive, it could also be cardiac related. I was unsure of all her symptoms & that she needed to be seen today at least by UC ASAP. Patient's sister with whom she shares a cell phone stated that she would let her sister know that we advised she be seen ASAP for evaluation.

## 2021-02-15 LAB — SARS-COV-2, NAA 2 DAY TAT

## 2021-02-15 LAB — NOVEL CORONAVIRUS, NAA: SARS-CoV-2, NAA: NOT DETECTED

## 2021-02-15 NOTE — Telephone Encounter (Signed)
noted 

## 2021-02-25 NOTE — Telephone Encounter (Signed)
Pt had called and left a message.  I read message from [place] when pt returned call.

## 2021-02-25 NOTE — Telephone Encounter (Signed)
Not sure if pt sent message, called or came in the office? She would need to contact the catheter company to get a ship date and tracking number for estimated delivery.  Marland Kitchenleft message to have patient return my call.

## 2021-02-25 NOTE — Telephone Encounter (Signed)
Pt has 16 catheters left.  She reordered on 11/7 and she is not sure if they will come in before she runs out.

## 2021-03-01 NOTE — Telephone Encounter (Signed)
Pt called office to let us know her catheters won't be shipped until this Friday and she's afraid she will run out.  She wants to know if she can pick up at least 10 from office.

## 2021-03-01 NOTE — Telephone Encounter (Signed)
I am in [place] today and can't assist pt, I will route to [facility] for assistance.

## 2021-03-02 ENCOUNTER — Telehealth: Payer: Self-pay | Admitting: Urology

## 2021-03-02 NOTE — Telephone Encounter (Signed)
Patient is requesting cath supplies.  Her insurance will not mail her supplies until Friday.  She is wanting to come by the office before 5 on 03/03/21

## 2021-03-02 NOTE — Telephone Encounter (Signed)
Spoke with patient and advised results  From previous phone note/mychart note

## 2021-03-07 ENCOUNTER — Ambulatory Visit: Payer: BC Managed Care – PPO | Admitting: Podiatry

## 2021-03-08 ENCOUNTER — Ambulatory Visit: Payer: BC Managed Care – PPO | Admitting: Urology

## 2021-03-08 ENCOUNTER — Ambulatory Visit: Payer: BC Managed Care – PPO | Admitting: Physician Assistant

## 2021-03-09 ENCOUNTER — Ambulatory Visit: Payer: BC Managed Care – PPO | Admitting: Urology

## 2021-03-15 ENCOUNTER — Encounter: Payer: Self-pay | Admitting: Physician Assistant

## 2021-03-15 ENCOUNTER — Ambulatory Visit (INDEPENDENT_AMBULATORY_CARE_PROVIDER_SITE_OTHER): Payer: BC Managed Care – PPO | Admitting: Physician Assistant

## 2021-03-15 ENCOUNTER — Other Ambulatory Visit: Payer: Self-pay

## 2021-03-15 VITALS — BP 109/70 | HR 66 | Ht 70.0 in | Wt 143.0 lb

## 2021-03-15 DIAGNOSIS — N390 Urinary tract infection, site not specified: Secondary | ICD-10-CM

## 2021-03-15 DIAGNOSIS — N319 Neuromuscular dysfunction of bladder, unspecified: Secondary | ICD-10-CM

## 2021-03-15 LAB — URINALYSIS, COMPLETE
Bilirubin, UA: NEGATIVE
Glucose, UA: NEGATIVE
Ketones, UA: NEGATIVE
Leukocytes,UA: NEGATIVE
Nitrite, UA: NEGATIVE
Protein,UA: NEGATIVE
RBC, UA: NEGATIVE
Specific Gravity, UA: 1.015 (ref 1.005–1.030)
Urobilinogen, Ur: 0.2 mg/dL (ref 0.2–1.0)
pH, UA: 6.5 (ref 5.0–7.5)

## 2021-03-15 LAB — MICROSCOPIC EXAMINATION
Bacteria, UA: NONE SEEN
WBC, UA: NONE SEEN /hpf (ref 0–5)

## 2021-03-15 NOTE — Progress Notes (Signed)
In and Out Catheterization  Patient is present today for a I & O catheterization due to dysuria. Patient was cleaned and prepped in a sterile fashion with betadine . A 14FR cath was inserted no complications were noted, of urine return was noted, urine was pale yellow in color. A clean urine sample was collected for urinalysis. Bladder was drained  And catheter was removed with out difficulty.    Performed by: Franchot Erichsen CMA

## 2021-03-15 NOTE — Progress Notes (Signed)
03/15/2021 2:54 PM   [name] Apr 23, 1970 [id number]  CC: Chief Complaint  Patient presents with   Other   HPI: Cristina Wade is a 50 y.o. female with PMH neurogenic bladder managed by CIC, hematuria with benign work-up in 2015 and negative cystoscopy in 2022, stable bilateral nonobstructing nephrolithiasis, chronic back pain, and recurrent UTI who presents today for urgent care follow-up.   She was seen at urgent care on 03/07/2021 with reports of chills, back pain, malodorous urine, and dysuria x1 week.  She was initially treated with Cipro 500 mg twice daily x10 days, which was augmented with doxycycline twice daily x10 days when her urine culture finalized with multidrug-resistant staph epidermidis.  Today she reports persistent back pain that she attributes to her kidneys, chills, and has various questions regarding her renal cyst, fluid intake, and frequency of self-catheterization. She reports she is running out of catheters because sometimes she accidentally contaminates them prior to insertion and throws them away instead of using them.  In-office catheterized UA and microscopy today pan negative.  Catheterized residual 185 mL.  PMH: Past Medical History:  Diagnosis Date   Asthma    Chronic pelvic pain in female 2018   Cystitis    Cystocele    Endometrial polyp    Endometriosis    per pt report but never seen during surg   Frequent headaches    Gastritis    GERD (gastroesophageal reflux disease)    rare   Gross hematuria    History of kidney stones    Microscopic hematuria    Migraine    Migraines    Neuropathy    Osteopenia    Urinary disorder    UTI (lower urinary tract infection)     Surgical History: Past Surgical History:  Procedure Laterality Date   COLONOSCOPY N/A 11/05/2014   Procedure: COLONOSCOPY;  Surgeon: Manya Silvas, MD;  Location: [place];  Service: Endoscopy;  Laterality: N/A;   COLONOSCOPY  11/2016   Dr. Vira Agar   CYSTOSCOPY   2007   with biopsy    CYSTOSCOPY N/A 06/01/2016   Procedure: CYSTOSCOPY;  Surgeon: Malachy Mood, MD;  Location: [facility] ORS;  Service: Gynecology;  Laterality: N/A;   DIAGNOSTIC LAPAROSCOPY     DILATATION & CURETTAGE/HYSTEROSCOPY WITH MYOSURE N/A 04/13/2015   Procedure: DILATATION & CURETTAGE/HYSTEROSCOPY WITH MYOSURE/POLYPECTOMY;  Surgeon: Honor Loh Ward, MD;  Location: [facility] ORS;  Service: Gynecology;  Laterality: N/A;   DILATATION & CURETTAGE/HYSTEROSCOPY WITH MYOSURE N/A 06/01/2016   Procedure: DILATATION & CURETTAGE/HYSTEROSCOPY WITH MYOSURE;  Surgeon: Malachy Mood, MD;  Location: [facility] ORS;  Service: Gynecology;  Laterality: N/A;   GUM SURGERY     laproscopy  2007   POLYPECTOMY     endometrial    Home Medications:  Allergies as of 03/15/2021       Reactions   Lac Bovis Other (See Comments)   Milk-related Compounds    Omeprazole Other (See Comments)   Pt reports "Chest pain, HA and Dysuria."   Penicillins Itching, Rash        Medication List        Accurate as of March 15, 2021  2:54 PM. If you have any questions, ask your nurse or doctor.          STOP taking these medications    nitrofurantoin (macrocrystal-monohydrate) 100 MG capsule Commonly known as: Macrobid Stopped by: Debroah Loop, PA-C       TAKE these medications    Azelastine HCl 137 MCG/SPRAY Soln  SMARTSIG:2 Spray(s) Both Nares Twice Daily PRN   CALCIUM 1200 PO Take 2-3 tablets by mouth daily. Reported on 05/26/2015   ciprofloxacin 500 MG tablet Commonly known as: CIPRO Take 500 mg by mouth 2 (two) times daily.   mometasone 50 MCG/ACT nasal spray Commonly known as: NASONEX 2 sprays daily.   polyethylene glycol 17 g packet Commonly known as: MIRALAX / GLYCOLAX Take 17 g by mouth daily.   pregabalin 100 MG capsule Commonly known as: LYRICA Take 100 mg by mouth 2 (two) times daily.   psyllium 0.52 g capsule Commonly known as: REGULOID Take 0.52 g by mouth daily.         Allergies:  Allergies  Allergen Reactions   Lac Bovis Other (See Comments)   Milk-Related Compounds    Omeprazole Other (See Comments)    Pt reports "Chest pain, HA and Dysuria."   Penicillins Itching and Rash    Family History: Family History  Problem Relation Age of Onset   Colon cancer Father 44   Arthritis Father    Hyperlipidemia Father    Transient ischemic attack Father    Cancer - Colon Father 88   Prostate cancer Father 68   Valvular heart disease Father    Arthritis Mother    Heart disease Mother    Stroke Mother        TIA   Hypertension Mother    Diabetes Mother    Kidney cancer Mother 84   Heart failure Mother    Hyperlipidemia Mother    Cancer Maternal Grandmother    Cancer Paternal Grandfather        prostate   Breast cancer Cousin 21       has contact    Social History:   reports that she has never smoked. She has never used smokeless tobacco. She reports that she does not drink alcohol and does not use drugs.  Physical Exam: BP 109/70   Pulse 66   Ht 5\' 10"  (1.778 m)   Wt 143 lb (64.9 kg)   BMI 20.52 kg/m   Constitutional:  Alert and oriented, no acute distress, nontoxic appearing HEENT: [place], AT Cardiovascular: No clubbing, cyanosis, or edema Respiratory: Normal respiratory effort, no increased work of breathing Skin: No rashes, bruises or suspicious lesions Neurologic: Grossly intact, no focal deficits, moving all 4 extremities Psychiatric: Normal mood and affect  Laboratory Data: Results for orders placed or performed in visit on 03/15/21  Microscopic Examination   Urine  Result Value Ref Range   WBC, UA None seen 0 - 5 /hpf   RBC 0-2 0 - 2 /hpf   Epithelial Cells (non renal) 0-10 0 - 10 /hpf   Bacteria, UA None seen None seen/Few  Urinalysis, Complete  Result Value Ref Range   Specific Gravity, UA 1.015 1.005 - 1.030   pH, UA 6.5 5.0 - 7.5   Color, UA Yellow Yellow   Appearance Ur Clear Clear   Leukocytes,UA Negative  Negative   Protein,UA Negative Negative/Trace   Glucose, UA Negative Negative   Ketones, UA Negative Negative   RBC, UA Negative Negative   Bilirubin, UA Negative Negative   Urobilinogen, Ur 0.2 0.2 - 1.0 mg/dL   Nitrite, UA Negative Negative   Microscopic Examination See below:    Assessment & Plan:   1. Recurrent UTI UA benign today. OK to stop Cipro, complete prescribed Doxy. We discussed that her flank pain is likely MSK in origin. No urologic source for her chills. -  Urinalysis, Complete  2. Neurogenic bladder Encouraged her to continue CIC 3 times daily. Will increase rx for catheters for 4 daily to allow for extra supply. Provided samples today. Counseled her to maintain stable fluid intake.   Return if symptoms worsen or fail to improve.  Debroah Loop, PA-C  [facility] [address] [place], [place] [address] [phone]

## 2021-03-15 NOTE — Patient Instructions (Signed)
You may stop Cipro (ciprofloxacin). Please continue Doxy (doxycycline) and complete this course of antibiotics. Continue to self-cath 3 times daily. Continue your current fluid intake.

## 2021-03-17 ENCOUNTER — Ambulatory Visit: Payer: BC Managed Care – PPO | Admitting: Urology

## 2021-03-18 ENCOUNTER — Encounter: Payer: Self-pay | Admitting: Family

## 2021-03-29 ENCOUNTER — Ambulatory Visit: Payer: BC Managed Care – PPO | Admitting: Family

## 2021-04-04 ENCOUNTER — Ambulatory Visit: Payer: BC Managed Care – PPO | Admitting: Family

## 2021-04-06 ENCOUNTER — Ambulatory Visit: Payer: BC Managed Care – PPO | Admitting: Podiatry

## 2021-04-06 ENCOUNTER — Other Ambulatory Visit: Payer: Self-pay | Admitting: Otolaryngology

## 2021-04-06 DIAGNOSIS — R42 Dizziness and giddiness: Secondary | ICD-10-CM

## 2021-04-13 ENCOUNTER — Encounter: Payer: Self-pay | Admitting: Family

## 2021-04-14 ENCOUNTER — Ambulatory Visit
Admission: RE | Admit: 2021-04-14 | Discharge: 2021-04-14 | Disposition: A | Discharge status: Home / Self Care | Payer: BC Managed Care – PPO | Source: Ambulatory Visit | Attending: Otolaryngology | Admitting: Otolaryngology

## 2021-04-14 DIAGNOSIS — R42 Dizziness and giddiness: Secondary | ICD-10-CM | POA: Diagnosis not present

## 2021-04-15 ENCOUNTER — Other Ambulatory Visit: Payer: Self-pay

## 2021-04-15 ENCOUNTER — Ambulatory Visit (INDEPENDENT_AMBULATORY_CARE_PROVIDER_SITE_OTHER): Payer: BC Managed Care – PPO

## 2021-04-15 ENCOUNTER — Encounter: Payer: Self-pay | Admitting: Family

## 2021-04-15 ENCOUNTER — Ambulatory Visit: Payer: BC Managed Care – PPO

## 2021-04-15 ENCOUNTER — Ambulatory Visit (INDEPENDENT_AMBULATORY_CARE_PROVIDER_SITE_OTHER): Payer: BC Managed Care – PPO | Admitting: Family

## 2021-04-15 VITALS — BP 102/60 | HR 67 | Temp 97.5°F | Ht 70.0 in | Wt 137.2 lb

## 2021-04-15 DIAGNOSIS — R6883 Chills (without fever): Secondary | ICD-10-CM

## 2021-04-15 DIAGNOSIS — M2170 Unequal limb length (acquired), unspecified site: Secondary | ICD-10-CM

## 2021-04-15 DIAGNOSIS — G8929 Other chronic pain: Secondary | ICD-10-CM

## 2021-04-15 DIAGNOSIS — R2689 Other abnormalities of gait and mobility: Secondary | ICD-10-CM

## 2021-04-15 DIAGNOSIS — G629 Polyneuropathy, unspecified: Secondary | ICD-10-CM

## 2021-04-15 DIAGNOSIS — M545 Low back pain, unspecified: Secondary | ICD-10-CM

## 2021-04-15 DIAGNOSIS — S9032XA Contusion of left foot, initial encounter: Secondary | ICD-10-CM

## 2021-04-15 LAB — CBC WITH DIFFERENTIAL/PLATELET
Basophils Absolute: 0 10*3/uL (ref 0.0–0.1)
Basophils Relative: 0.6 % (ref 0.0–3.0)
Eosinophils Absolute: 0 10*3/uL (ref 0.0–0.7)
Eosinophils Relative: 1.1 % (ref 0.0–5.0)
HCT: 41.9 % (ref 36.0–46.0)
Hemoglobin: 13.8 g/dL (ref 12.0–15.0)
Lymphocytes Relative: 28.4 % (ref 12.0–46.0)
Lymphs Abs: 1.1 10*3/uL (ref 0.7–4.0)
MCHC: 32.9 g/dL (ref 30.0–36.0)
MCV: 90.1 fl (ref 78.0–100.0)
Monocytes Absolute: 0.4 10*3/uL (ref 0.1–1.0)
Monocytes Relative: 10.7 % (ref 3.0–12.0)
Neutro Abs: 2.2 10*3/uL (ref 1.4–7.7)
Neutrophils Relative %: 59.2 % (ref 43.0–77.0)
Platelets: 191 10*3/uL (ref 150.0–400.0)
RBC: 4.65 Mil/uL (ref 3.87–5.11)
RDW: 13.2 % (ref 11.5–15.5)
WBC: 3.7 10*3/uL — ABNORMAL LOW (ref 4.0–10.5)

## 2021-04-15 LAB — URINALYSIS, ROUTINE W REFLEX MICROSCOPIC
Bilirubin Urine: NEGATIVE
Hgb urine dipstick: NEGATIVE
Ketones, ur: NEGATIVE
Leukocytes,Ua: NEGATIVE
Nitrite: NEGATIVE
RBC / HPF: NONE SEEN (ref 0–?)
Specific Gravity, Urine: <=1.005 — AB (ref 1.000–1.030)
Total Protein, Urine: NEGATIVE
Urine Glucose: NEGATIVE
Urobilinogen, UA: 0.2 (ref 0.0–1.0)
WBC, UA: NONE SEEN (ref 0–?)
pH: 6.5 (ref 5.0–8.0)

## 2021-04-15 LAB — COMPREHENSIVE METABOLIC PANEL
ALT: 17 U/L (ref 0–35)
AST: 19 U/L (ref 0–37)
Albumin: 4.3 g/dL (ref 3.5–5.2)
Alkaline Phosphatase: 55 U/L (ref 39–117)
BUN: 8 mg/dL (ref 6–23)
CO2: 32 mEq/L (ref 19–32)
Calcium: 9.9 mg/dL (ref 8.4–10.5)
Chloride: 104 mEq/L (ref 96–112)
Creatinine, Ser: 0.74 mg/dL (ref 0.40–1.20)
GFR: 92.89 mL/min (ref >60.00)
Glucose, Bld: 64 mg/dL — ABNORMAL LOW (ref 70–99)
Potassium: 4.1 mEq/L (ref 3.5–5.1)
Sodium: 142 mEq/L (ref 135–145)
Total Bilirubin: 0.7 mg/dL (ref 0.2–1.2)
Total Protein: 6.9 g/dL (ref 6.0–8.3)

## 2021-04-15 LAB — IBC + FERRITIN
Ferritin: 27.6 ng/mL (ref 10.0–291.0)
Iron: 104 ug/dL (ref 42–145)
Saturation Ratios: 29.8 % (ref 20.0–50.0)
TIBC: 348.6 ug/dL (ref 250.0–450.0)
Transferrin: 249 mg/dL (ref 212.0–360.0)

## 2021-04-15 LAB — B12 AND FOLATE PANEL
Folate: >24.2 ng/mL (ref >5.9)
Vitamin B-12: 543 pg/mL (ref 211–911)

## 2021-04-15 LAB — TSH: TSH: 1.3 u[IU]/mL (ref 0.35–5.50)

## 2021-04-15 LAB — HEMOGLOBIN A1C: Hgb A1c MFr Bld: 5.8 % (ref 4.6–6.5)

## 2021-04-15 NOTE — Assessment & Plan Note (Addendum)
Chills. Episodic. Etiology unclear. She has lost 10 lbs since 02/26/2020 however she has increased walking and eating healthier. Re emphasized the importance of all prevention being up to date. She declines our scheduling mammogram and she will call to schedule herself. Appointment with GI April 2023 and she states this appointment is schedule colonoscopy and EGD.  Pending labs. Close follow up.

## 2021-04-15 NOTE — Assessment & Plan Note (Addendum)
Benign exam. No pain today. Distal peripheral neuropathy. Pending lumbar and right leg XR . Referral to PT as concerned deconditioning playing a role. Will monitor closely.

## 2021-04-15 NOTE — Patient Instructions (Addendum)
If iis very important to ensure that both colonoscopy and mammogram up-to-date .  please call  and schedule your 3D mammogram and /or bone density scan as we discussed.   [facility]  [address]  [place], [place]  [phone]   Referral to physical therapy. Let us know if you dont hear back within a week in regards to an appointment being scheduled.   Please let me know how you doing.

## 2021-04-15 NOTE — Progress Notes (Signed)
SITUATION Reason for Consult: Follow-up with custom foot orthotics Patient / Caregiver Report: Patient feels right arch is too high and left foot orthosis does not adequately compensate for leg length discrepancy  OBJECTIVE DATA History / Diagnosis:    ICD-10-CM   1. Contusion of left foot, initial encounter  S90.32XA     2. 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 Balance disorder  R26.89     4. Neuropathy  G62.9       Change in Pathology: None  ACTIONS PERFORMED Patient's equipment was checked for structural stability and fit. Devices taken for adjustment. All questions answered and concerns addressed.  PLAN Patient to be seen in two weeks for refitting. Plan of care discussed with and agreed upon by patient / caregiver.

## 2021-04-15 NOTE — Progress Notes (Signed)
Subjective:    Patient ID: Cristina Wade, female    DOB: 1970-05-08, 50 y.o.   MRN: [id number]  CC: Cristina Wade is a 50 y.o. female who presents today for an acute visit.    HPI: Chief complaint is episodic chills.  This is been ongoing for 4 months. Comes and goes. May last 10 seconds and then self resolves.  She reports  having to add clothes as she gets so cold. She drinks water warm as cold water makes her cold.  She has started iron for this.  No fever.  She has bilateral hip pain when sitting for long periods of time. At times, she has right posterior leg pain. No leg or back pain today.   She has had weight loss. She is eating less on purpose and eating less sweets.   No back or thigh numbness, saddle anesthesia. She has chronic neuropathy bilateral feet which extends to both calves.   Walks daily for 20 minutes two or three times per day. Exercise has increased as caring for a dog. She is doing swats 30 daily and feels her legs are weak.   she would like to be tested for UTI as she gets the UTIs frequently. She has no urine odor, dysuria, flank pain.    Reports GYN has ordered mammogram and she prefers to schedule this herself.   Follow up with Dr Haig Prophet 07/2021 to schedule colonoscopy  She was seen 03/26/2021 for acute pyelonephritis. Urinalysis negative protein, glucose, ketones, blood, nitrite, leukocyte  esterase, white blood cells. She has completed cipro 500mg  BID started 02/09/21 which was stopped by urology 03/15/21. She was continued on,  doxycycline, now completed.   She continues to self cath 3 times per day.   HISTORY:  Past Medical History:  Diagnosis Date   Asthma    Chronic pelvic pain in female 2018   Cystitis    Cystocele    Endometrial polyp    Endometriosis    per pt report but never seen during surg   Frequent headaches    Gastritis    GERD (gastroesophageal reflux disease)    rare   Gross hematuria    History of kidney stones    Microscopic  hematuria    Migraine    Migraines    Neuropathy    Osteopenia    Urinary disorder    UTI (lower urinary tract infection)    Past Surgical History:  Procedure Laterality Date   COLONOSCOPY N/A 11/05/2014   Procedure: COLONOSCOPY;  Surgeon: Manya Silvas, MD;  Location: [place] [facility];  Service: Endoscopy;  Laterality: N/A;   COLONOSCOPY  11/2016   Dr. Vira Agar   CYSTOSCOPY  2007   with biopsy    CYSTOSCOPY N/A 06/01/2016   Procedure: CYSTOSCOPY;  Surgeon: Malachy Mood, MD;  Location: [facility];  Service: Gynecology;  Laterality: N/A;   DIAGNOSTIC LAPAROSCOPY     DILATATION & CURETTAGE/HYSTEROSCOPY WITH MYOSURE N/A 04/13/2015   Procedure: DILATATION & CURETTAGE/HYSTEROSCOPY WITH MYOSURE/POLYPECTOMY;  Surgeon: Honor Loh Ward, MD;  Location: [facility];  Service: Gynecology;  Laterality: N/A;   DILATATION & CURETTAGE/HYSTEROSCOPY WITH MYOSURE N/A 06/01/2016   Procedure: DILATATION & CURETTAGE/HYSTEROSCOPY WITH MYOSURE;  Surgeon: Malachy Mood, MD;  Location: [facility];  Service: Gynecology;  Laterality: N/A;   GUM SURGERY     laproscopy  2007   POLYPECTOMY     endometrial   Family History  Problem Relation Age of Onset   Colon cancer Father 79   Arthritis  Father    Hyperlipidemia Father    Transient ischemic attack Father    Cancer - Colon Father 52   Prostate cancer Father 18   Valvular heart disease Father    Arthritis Mother    Heart disease Mother    Stroke Mother        TIA   Hypertension Mother    Diabetes Mother    Kidney cancer Mother 50   Heart failure Mother    Hyperlipidemia Mother    Cancer Maternal Grandmother    Cancer Paternal Grandfather        prostate   Breast cancer Cousin 48       has contact    Allergies: Lac bovis, Milk-related compounds, Omeprazole, and Penicillins Current Outpatient Medications on File Prior to Visit  Medication Sig Dispense Refill   Azelastine HCl 137 MCG/SPRAY SOLN SMARTSIG:2 Spray(s) Both Nares Twice Daily PRN      Calcium Carbonate-Vit D-Min (CALCIUM 1200 PO) Take 2-3 tablets by mouth daily. Reported on 05/26/2015     ciprofloxacin (CIPRO) 500 MG tablet Take 500 mg by mouth 2 (two) times daily.     mometasone (NASONEX) 50 MCG/ACT nasal spray 2 sprays daily.     polyethylene glycol (MIRALAX / GLYCOLAX) 17 g packet Take 17 g by mouth daily.     pregabalin (LYRICA) 100 MG capsule Take 100 mg by mouth 2 (two) times daily.     psyllium (REGULOID) 0.52 g capsule Take 0.52 g by mouth daily.     Current Facility-Administered Medications on File Prior to Visit  Medication Dose Route Frequency Provider Last Rate Last Admin   lidocaine (XYLOCAINE) 2 % jelly 1 application  1 application Urethral Once Sondra Come, MD        Social History   Tobacco Use   Smoking status: Never   Smokeless tobacco: Never  Vaping Use   Vaping Use: Never used  Substance Use Topics   Alcohol use: No   Drug use: No    Review of Systems  Constitutional:  Positive for activity change, appetite change and chills. Negative for fever.  Respiratory:  Negative for cough.   Cardiovascular:  Negative for chest pain and palpitations.  Gastrointestinal:  Negative for nausea and vomiting.  Genitourinary:  Negative for dysuria, flank pain and frequency.  Musculoskeletal:  Negative for back pain. Gait problem: none today.    Objective:    BP 102/60 (BP Location: Left Arm, Patient Position: Sitting, Cuff Size: Normal)    Pulse 67    Temp (!) 97.5 F (36.4 C) (Oral)    Ht 5\' 10"  (1.778 m)    Wt 137 lb 3.2 oz (62.2 kg)    SpO2 99%    BMI 19.69 kg/m  Wt Readings from Last 3 Encounters:  04/15/21 137 lb 3.2 oz (62.2 kg)  03/15/21 143 lb (64.9 kg)  01/28/21 142 lb (64.4 kg)   Weight 02/26/2020 147 lbs  Physical Exam Vitals reviewed.  Constitutional:      Appearance: She is well-developed.  Eyes:     Conjunctiva/sclera: Conjunctivae normal.  Cardiovascular:     Rate and Rhythm: Normal rate and regular rhythm.     Pulses: Normal  pulses.     Heart sounds: Normal heart sounds.  Pulmonary:     Effort: Pulmonary effort is normal.     Breath sounds: Normal breath sounds. No wheezing, rhonchi or rales.  Musculoskeletal:     Lumbar back: No swelling, edema, spasms, tenderness or bony  tenderness. Normal range of motion.     Right upper leg: No swelling, tenderness or bony tenderness.     Comments: Full range of motion with flexion, tension, lateral side bends. No bony tenderness. No pain, numbness, tingling elicited with single leg raise bilaterally.  Observed her doing 2 squats in the office. Overall with ease however she was slower to return to upright position from squat. She didn't use her arms to raise up.   Skin:    General: Skin is warm and dry.  Neurological:     Mental Status: She is alert.     Sensory: No sensory deficit.     Deep Tendon Reflexes:     Reflex Scores:      Patellar reflexes are 2+ on the right side and 2+ on the left side.    Comments: Sensation and strength intact bilateral lower extremities.  Psychiatric:        Speech: Speech normal.        Behavior: Behavior normal.        Thought Content: Thought content normal.       Assessment & Plan:   Problem List Items Addressed This Visit       Other   Chills (without fever)    Chills. Episodic. Etiology unclear. She has lost 10 lbs since 02/26/2020 however she has increased walking and eating healthier. Re emphasized the importance of all prevention being up to date. She declines our scheduling mammogram and she will call to schedule herself. Appointment with GI April 2023 and she states this appointment is schedule colonoscopy and EGD.  Pending labs. Close follow up.       Chronic low back pain    Benign exam. No pain today. Distal peripheral neuropathy. Pending lumbar and right leg XR . Referral to PT as concerned deconditioning playing a role. Will monitor closely.       Relevant Orders   DG Lumbar Spine Complete   Ambulatory  referral to Physical Therapy   DG FEMUR, MIN 2 VIEWS RIGHT   Other Visit Diagnoses     Chills    -  Primary   Relevant Orders   Urine Culture   Urinalysis, Routine w reflex microscopic (Completed)   B12 and Folate Panel (Completed)   IBC + Ferritin (Completed)   CBC with Differential/Platelet (Completed)   Comprehensive metabolic panel (Completed)   TSH (Completed)   Hemoglobin A1c (Completed)         I am having [name] maintain her Calcium Carbonate-Vit D-Min (CALCIUM 1200 PO), Azelastine HCl, pregabalin, psyllium, polyethylene glycol, mometasone, and ciprofloxacin. We will continue to administer lidocaine.   No orders of the defined types were placed in this encounter.   Return precautions given.   Risks, benefits, and alternatives of the medications and treatment plan prescribed today were discussed, and patient expressed understanding.   Education regarding symptom management and diagnosis given to patient on AVS.  Continue to follow with [name], FNP for routine health maintenance.   [name] and I agreed with plan.   Rennie Plowman, FNP

## 2021-04-17 LAB — URINE CULTURE
MICRO NUMBER:: 12813620
SPECIMEN QUALITY:: ADEQUATE

## 2021-04-20 ENCOUNTER — Other Ambulatory Visit: Payer: Self-pay | Admitting: Obstetrics and Gynecology

## 2021-04-20 DIAGNOSIS — Z1231 Encounter for screening mammogram for malignant neoplasm of breast: Secondary | ICD-10-CM

## 2021-04-25 ENCOUNTER — Telehealth: Payer: Self-pay | Admitting: Family

## 2021-04-25 NOTE — Telephone Encounter (Signed)
Pt called in stating that the provider told her if she started having symptoms to come back to check urine. Pt does not know if she need to come make an appt or drop off urine

## 2021-04-25 NOTE — Telephone Encounter (Signed)
You may order ua and culture  Please re emphasize the importance of clean-catch

## 2021-04-26 ENCOUNTER — Telehealth: Payer: Self-pay | Admitting: Family

## 2021-04-26 ENCOUNTER — Ambulatory Visit: Payer: BC Managed Care – PPO | Admitting: Urology

## 2021-04-26 ENCOUNTER — Encounter: Payer: Self-pay | Admitting: Urology

## 2021-04-26 ENCOUNTER — Other Ambulatory Visit: Payer: Self-pay

## 2021-04-26 ENCOUNTER — Telehealth: Payer: Self-pay | Admitting: Urology

## 2021-04-26 VITALS — BP 138/70 | HR 74 | Ht 70.0 in | Wt 137.0 lb

## 2021-04-26 DIAGNOSIS — N319 Neuromuscular dysfunction of bladder, unspecified: Secondary | ICD-10-CM

## 2021-04-26 DIAGNOSIS — N390 Urinary tract infection, site not specified: Secondary | ICD-10-CM

## 2021-04-26 DIAGNOSIS — R319 Hematuria, unspecified: Secondary | ICD-10-CM

## 2021-04-26 DIAGNOSIS — R300 Dysuria: Secondary | ICD-10-CM

## 2021-04-26 LAB — MICROSCOPIC EXAMINATION
Epithelial Cells (non renal): NONE SEEN /hpf (ref 0–10)
RBC, Urine: NONE SEEN /hpf (ref 0–2)
WBC, UA: >30 /hpf — ABNORMAL HIGH (ref 0–5)

## 2021-04-26 LAB — URINALYSIS, COMPLETE
Bilirubin, UA: NEGATIVE
Glucose, UA: NEGATIVE
Ketones, UA: NEGATIVE
Nitrite, UA: POSITIVE — AB
Protein,UA: NEGATIVE
Specific Gravity, UA: 1.015 (ref 1.005–1.030)
Urobilinogen, Ur: 0.2 mg/dL (ref 0.2–1.0)
pH, UA: 7 (ref 5.0–7.5)

## 2021-04-26 MED ORDER — NITROFURANTOIN MONOHYD MACRO 100 MG PO CAPS: 100.0000 mg | ORAL_CAPSULE | Freq: Two times a day (BID) | ORAL | 0 refills | Status: DC

## 2021-04-26 NOTE — Telephone Encounter (Signed)
LMTCB patient just needs lab appointment for urine. ANYONE may schedule patient for this.

## 2021-04-26 NOTE — Telephone Encounter (Signed)
Lft pt vm to call ofc regarding PT referral to [facility]. thanks

## 2021-04-26 NOTE — Telephone Encounter (Signed)
Per pt it's [employer]

## 2021-04-26 NOTE — Telephone Encounter (Signed)
Please ask Cristina Wade who her catheter supplier is either, [phone] or [employer] because I need to contact the catheter supplier to get the appropriate catheter for her.  The number that she gave me was the catheter maker.

## 2021-04-26 NOTE — Progress Notes (Signed)
04/26/2021 3:10 PM   Cristina Wade 1971/01/10 [id number]  Referring provider: Burnard Hawthorne, FNP [address],  [place] [address]  Chief Complaint  Patient presents with   Recurrent UTI     Urological history: 1. High risk hematuria -non-smoker -work up 2015 -bilateral punctate stones -cysto 2019 NED -no reports of gross hematuria -UA negative for micro heme  2. Neurogenic bladder -UDS 2017 indicating nonobstructive urinary retention with poor bladder sensation and external sphincter dyssynergia -completed 12 weeks of PTNS -completed pelvic floor PT -manages with self cathing three times daily  3. Nephrolithiasis -non-contrast CT 2022 2 mm stone in the right kidney lower pole without hydronephrosis.  Three tiny stones in left kidney lower pole region without hydronephrosis  4. rUTI's -contributing factors of age, incomplete emptying, vaginal atrophy, chronic constipation,  -documented positive urine cultures over the last year  03/06/2020 Klebsiella pneumoniae  08/20/2020 citrobacter freundii  09/18/2020 proteus mirabilis  10/21/2020 citrobacter freundii  12/07/2020 staph epidermis/B-hemolytic strep  12/24/2020 Klebsiella pneumoniae  12/30/2020 Klebsiella pneumoniae/B-hemolytic strep  01/20/2021 multiple species      HPI: Cristina Wade is a 51 y.o. female who presents today for possible UTI.    CATH UA nitrite positive, greater than 30 WBCs and many bacteria present.  She has not been using AZO or other OTC for bladder pain.    She has been having LBP, intermittent dysuria and foul smelling urine x 3 days.  Patient denies any modifying or aggravating factors.  Patient denies any gross hematuria, dysuria or suprapubic/flank pain.  Patient denies any fevers, chills, nausea or vomiting.    She states the catheters that she received, the perineal wipes were dried out.  She therefore was not able to clean herself adequately prior to  catheterization and this likely resulted in her current UTI.  PMH: Past Medical History:  Diagnosis Date   Asthma    Chronic pelvic pain in female 2018   Cystitis    Cystocele    Endometrial polyp    Endometriosis    per pt report but never seen during surg   Frequent headaches    Gastritis    GERD (gastroesophageal reflux disease)    rare   Gross hematuria    History of kidney stones    Microscopic hematuria    Migraine    Migraines    Neuropathy    Osteopenia    Urinary disorder    UTI (lower urinary tract infection)     Surgical History: Past Surgical History:  Procedure Laterality Date   COLONOSCOPY N/A 11/05/2014   Procedure: COLONOSCOPY;  Surgeon: Manya Silvas, MD;  Location: [place];  Service: Endoscopy;  Laterality: N/A;   COLONOSCOPY  11/2016   Dr. Vira Agar   CYSTOSCOPY  2007   with biopsy    CYSTOSCOPY N/A 06/01/2016   Procedure: CYSTOSCOPY;  Surgeon: Malachy Mood, MD;  Location: [facility];  Service: Gynecology;  Laterality: N/A;   DIAGNOSTIC LAPAROSCOPY     DILATATION & CURETTAGE/HYSTEROSCOPY WITH MYOSURE N/A 04/13/2015   Procedure: DILATATION & CURETTAGE/HYSTEROSCOPY WITH MYOSURE/POLYPECTOMY;  Surgeon: Honor Loh Ward, MD;  Location: [facility];  Service: Gynecology;  Laterality: N/A;   DILATATION & CURETTAGE/HYSTEROSCOPY WITH MYOSURE N/A 06/01/2016   Procedure: DILATATION & CURETTAGE/HYSTEROSCOPY WITH MYOSURE;  Surgeon: Malachy Mood, MD;  Location: [facility];  Service: Gynecology;  Laterality: N/A;   GUM SURGERY     laproscopy  2007   POLYPECTOMY     endometrial  Home Medications:  Allergies as of 04/26/2021       Reactions   Lac Bovis Other (See Comments)   Milk-related Compounds    Omeprazole Other (See Comments)   Pt reports "Chest pain, HA and Dysuria."   Penicillins Itching, Rash        Medication List        Accurate as of April 26, 2021  3:10 PM. If you have any questions, ask your nurse or doctor.           Azelastine HCl 137 MCG/SPRAY Soln SMARTSIG:2 Spray(s) Both Nares Twice Daily PRN   CALCIUM 1200 PO Take 2-3 tablets by mouth daily. Reported on 05/26/2015   ciprofloxacin 500 MG tablet Commonly known as: CIPRO Take 500 mg by mouth 2 (two) times daily.   mometasone 50 MCG/ACT nasal spray Commonly known as: NASONEX 2 sprays daily.   polyethylene glycol 17 g packet Commonly known as: MIRALAX / GLYCOLAX Take 17 g by mouth daily.   pregabalin 100 MG capsule Commonly known as: LYRICA Take 100 mg by mouth 2 (two) times daily.   psyllium 0.52 g capsule Commonly known as: REGULOID Take 0.52 g by mouth daily.        Allergies:  Allergies  Allergen Reactions   Lac Bovis Other (See Comments)   Milk-Related Compounds    Omeprazole Other (See Comments)    Pt reports "Chest pain, HA and Dysuria."   Penicillins Itching and Rash    Family History: Family History  Problem Relation Age of Onset   Colon cancer Father 77   Arthritis Father    Hyperlipidemia Father    Transient ischemic attack Father    Cancer - Colon Father 26   Prostate cancer Father 67   Valvular heart disease Father    Arthritis Mother    Heart disease Mother    Stroke Mother        TIA   Hypertension Mother    Diabetes Mother    Kidney cancer Mother 45   Heart failure Mother    Hyperlipidemia Mother    Cancer Maternal Grandmother    Cancer Paternal Grandfather        prostate   Breast cancer Cousin 51       has contact    Social History:  reports that she has never smoked. She has never used smokeless tobacco. She reports that she does not drink alcohol and does not use drugs.  ROS: Pertinent ROS in HPI  Physical Exam: BP 138/70    Pulse 74    Ht 5\' 10"  (1.778 m)    Wt 137 lb (62.1 kg)    BMI 19.66 kg/m   Constitutional:  Well nourished. Alert and oriented, No acute distress. HEENT: [place] AT, mask in place.  Trachea midline Cardiovascular: No clubbing, cyanosis, or edema. Respiratory: Normal  respiratory effort, no increased work of breathing. Neurologic: Grossly intact, no focal deficits, moving all 4 extremities. Psychiatric: Normal mood and affect.    Laboratory Data: Lab Results  Component Value Date   WBC 3.7 (L) 04/15/2021   HGB 13.8 04/15/2021   HCT 41.9 04/15/2021   MCV 90.1 04/15/2021   PLT 191.0 04/15/2021    Lab Results  Component Value Date   CREATININE 0.74 04/15/2021   Lab Results  Component Value Date   AST 19 04/15/2021   Lab Results  Component Value Date   ALT 17 04/15/2021   Urinalysis Results for orders placed or performed in visit on  04/26/21  Microscopic Examination   Urine  Result Value Ref Range   WBC, UA >30 (H) 0 - 5 /hpf   RBC None seen 0 - 2 /hpf   Epithelial Cells (non renal) None seen 0 - 10 /hpf   Mucus, UA Present (A) Not Estab.   Bacteria, UA Many (A) None seen/Few  Urinalysis, Complete  Result Value Ref Range   Specific Gravity, UA 1.015 1.005 - 1.030   pH, UA 7.0 5.0 - 7.5   Color, UA Yellow Yellow   Appearance Ur Cloudy (A) Clear   Leukocytes,UA 2+ (A) Negative   Protein,UA Negative Negative/Trace   Glucose, UA Negative Negative   Ketones, UA Negative Negative   RBC, UA Trace (A) Negative   Bilirubin, UA Negative Negative   Urobilinogen, Ur 0.2 0.2 - 1.0 mg/dL   Nitrite, UA Positive (A) Negative   Microscopic Examination See below:     I have reviewed the labs.   Pertinent Imaging: N/A  In and Out Catheterization Patient is present today for a I & O catheterization due to rUTI's. Patient was cleaned and prepped in a sterile fashion with betadine . A 14 FR cath was inserted no complications were noted , 80 ml of urine return was noted, urine was yellow in color. A clean urine sample was collected for UA and urine culture.  Bladder was drained  And catheter was removed with out difficulty.    Performed by: Gaspar Cola, CMA   Assessment & Plan:    1.rUTI's -The issue still continues to be colonization  versus recurrent UTIs and it is difficult to manage due to her limited medical knowledge and also seeing other physicians for her urinary issues -At this time she is somewhat symptomatic with intermittent dysuria and chills and UA is nitrate positive with greater than 30 WBCs and many bacteria present -Urine is sent for culture -I will start on Macrobid 100 mg twice daily as we do have a long holiday for laboratory services this weekend -I will contact the catheter company to see if we can switch her catheter preps from perineal wipes to iodine wipes as she states she is well comfortable with these and she is experience less urinary tract infections with these  2. Neurogenic bladder -continue to CIC x 3 times daily -advised her to self cath when she feels lower abdominal pain  3. High risk hematuria -work up x 2 NED, most recent imaging 2022 -reports of gross hematuria -UA negative for micro heme                                              No follow-ups on file.  These notes generated with voice recognition software. I apologize for typographical errors.  Zara Council, PA-C  [facility] [address]  [place], [place] [address] [phone]

## 2021-04-27 ENCOUNTER — Ambulatory Visit: Payer: BC Managed Care – PPO | Admitting: Family

## 2021-04-28 ENCOUNTER — Other Ambulatory Visit: Payer: Self-pay | Admitting: Urology

## 2021-04-28 DIAGNOSIS — N390 Urinary tract infection, site not specified: Secondary | ICD-10-CM

## 2021-04-28 MED ORDER — POVIDONE-IODINE 7.5 % EX SWAB: 1.0000 | CUTANEOUS | 3 refills | Status: DC | PRN

## 2021-04-28 NOTE — Telephone Encounter (Signed)
Left detailed message on machine with results, advised her to call if any questions

## 2021-04-28 NOTE — Telephone Encounter (Signed)
Per [employer] [phone]) company does not supply hygiene products/iodine pads. Did not have recommendations for other company for supplies.

## 2021-04-29 ENCOUNTER — Telehealth: Payer: Self-pay

## 2021-04-29 LAB — CULTURE, URINE COMPREHENSIVE

## 2021-04-29 NOTE — Telephone Encounter (Signed)
Incoming call from pt on triage line who states that [facility] informed her they do not carry iodine swabs and would not be able to get them. Patient questions if rx for just iodine swabs sent to [facility] [employer]. Please advise.

## 2021-04-29 NOTE — Telephone Encounter (Signed)
Per [employer] [phone]) company does not supply hygiene products/iodine pads. Did not have recommendations for other company for supplies.    Marland Kitchenleft message to have patient return my call.

## 2021-04-30 ENCOUNTER — Encounter: Payer: Self-pay | Admitting: Family

## 2021-05-02 ENCOUNTER — Other Ambulatory Visit: Payer: Self-pay | Admitting: *Deleted

## 2021-05-02 DIAGNOSIS — N390 Urinary tract infection, site not specified: Secondary | ICD-10-CM

## 2021-05-02 MED ORDER — POVIDONE-IODINE 7.5 % EX SWAB: 1.0000 | CUTANEOUS | 3 refills | Status: DC | PRN

## 2021-05-02 NOTE — Telephone Encounter (Signed)
Rx printed and faxed to [phone]

## 2021-05-06 ENCOUNTER — Telehealth: Payer: Self-pay

## 2021-05-06 ENCOUNTER — Telehealth: Payer: Self-pay | Admitting: Podiatry

## 2021-05-06 NOTE — Telephone Encounter (Signed)
Incoming call from pt who states she has questions about bladder pain. She was informed by another office yesterday that her urine cx was negative and is aware she has no infection, however she states she is continuing to have bladder pain. She questions if the bladder pain could be due to "acid in her bladder" which she thinks she may have caused by eating apples. Lengthy conversation had with pt in regards to dietary intake that would affect IC flares. Pt voiced understanding but would like message sent to provider for additional recommendations on bladder pain.

## 2021-05-06 NOTE — Telephone Encounter (Signed)
Pt called to check on status of her orthotics that you said you were going to work on when she saw you in [place] office on 12.30.22.Marland Kitchen

## 2021-05-09 ENCOUNTER — Telehealth: Payer: Self-pay | Admitting: Podiatry

## 2021-05-09 NOTE — Telephone Encounter (Signed)
Left message for pt that Aaron Edelman was out of the office the week he seen her and the inserts have been sent to manufacturer for the adjustment and they should be arriving soon and she will get a call when they come in to get scheduled to pick them up.

## 2021-05-10 ENCOUNTER — Encounter: Payer: Self-pay | Admitting: Family

## 2021-05-11 ENCOUNTER — Encounter: Payer: Self-pay | Admitting: Family

## 2021-05-11 ENCOUNTER — Other Ambulatory Visit: Payer: Self-pay

## 2021-05-13 ENCOUNTER — Other Ambulatory Visit: Payer: Self-pay | Admitting: Family

## 2021-05-13 DIAGNOSIS — R899 Unspecified abnormal finding in specimens from other organs, systems and tissues: Secondary | ICD-10-CM

## 2021-05-16 ENCOUNTER — Other Ambulatory Visit: Payer: Self-pay

## 2021-05-16 ENCOUNTER — Ambulatory Visit: Payer: BC Managed Care – PPO | Admitting: Urology

## 2021-05-16 DIAGNOSIS — R6883 Chills (without fever): Secondary | ICD-10-CM

## 2021-05-18 ENCOUNTER — Ambulatory Visit: Payer: BC Managed Care – PPO | Admitting: Urology

## 2021-05-19 ENCOUNTER — Other Ambulatory Visit: Payer: BC Managed Care – PPO

## 2021-05-25 ENCOUNTER — Other Ambulatory Visit: Payer: Self-pay | Admitting: Neurology

## 2021-05-25 DIAGNOSIS — G373 Acute transverse myelitis in demyelinating disease of central nervous system: Secondary | ICD-10-CM

## 2021-05-27 ENCOUNTER — Ambulatory Visit: Payer: BC Managed Care – PPO

## 2021-05-27 ENCOUNTER — Other Ambulatory Visit: Payer: Self-pay

## 2021-05-27 DIAGNOSIS — R2689 Other abnormalities of gait and mobility: Secondary | ICD-10-CM

## 2021-05-27 DIAGNOSIS — M2170 Unequal limb length (acquired), unspecified site: Secondary | ICD-10-CM

## 2021-05-27 DIAGNOSIS — S9032XA Contusion of left foot, initial encounter: Secondary | ICD-10-CM

## 2021-05-27 NOTE — Progress Notes (Signed)
SITUATION Reason for Consult: Follow-up with fitting of modified orthotics Patient / Caregiver Report: Patient reports comfort and is satisfied  OBJECTIVE DATA History / Diagnosis:    ICD-10-CM   1. Contusion of left foot, initial encounter  S90.32XA     2. 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 Balance disorder  R26.89       Change in Pathology: None  ACTIONS PERFORMED Patient's equipment was checked for structural stability and fit. Device(s) intact and fit is excellent. All questions answered and concerns addressed.  PLAN Follow-up as needed (PRN). Plan of care discussed with and agreed upon by patient / caregiver.

## 2021-06-01 ENCOUNTER — Ambulatory Visit: Payer: BC Managed Care – PPO | Admitting: Urology

## 2021-06-07 ENCOUNTER — Ambulatory Visit: Payer: BC Managed Care – PPO

## 2021-06-07 ENCOUNTER — Ambulatory Visit
Admission: RE | Admit: 2021-06-07 | Discharge: 2021-06-07 | Disposition: A | Discharge status: Home / Self Care | Payer: BC Managed Care – PPO | Source: Ambulatory Visit | Attending: Neurology | Admitting: Neurology

## 2021-06-07 ENCOUNTER — Other Ambulatory Visit: Payer: Self-pay

## 2021-06-07 DIAGNOSIS — N319 Neuromuscular dysfunction of bladder, unspecified: Secondary | ICD-10-CM

## 2021-06-07 DIAGNOSIS — G373 Acute transverse myelitis in demyelinating disease of central nervous system: Secondary | ICD-10-CM | POA: Diagnosis not present

## 2021-06-07 MED ORDER — GADOBUTROL 1 MMOL/ML IV SOLN
6.0000 mL | Freq: Once | INTRAVENOUS | Status: AC | PRN
  Administered 2021-06-07: 6 mL via INTRAVENOUS

## 2021-06-08 ENCOUNTER — Encounter: Payer: Self-pay | Admitting: Family

## 2021-06-08 NOTE — Telephone Encounter (Signed)
I called and spoke with [name] patient's sister. She will have patient call back our office as she ws tied up at this time.

## 2021-06-08 NOTE — Telephone Encounter (Signed)
I spoke with patient & she stated that she would reach out to urology tomorrow to see if they will collect urine sample to evaluate for UTI. I asked that if she was unsuccessful in reaching them that she let me know & I would try to reach. She stated that she has some cramping in her lower abdomen that was intermittent & not just when she urinated. It also doesn't always happen while urinating. She stated that her chills are better but does still want to see endo. She is not sure she needs to come in Friday. I let patiet know that if she had ANY questions for Claris Che that she needed to keep appointment to ask these. She has an MRI she stated with [facility] neuro but had not heard back yet. She will call tomorrow in regards to urology appointment & what she felt she needed to do in regards to Friday's appointment.

## 2021-06-09 ENCOUNTER — Other Ambulatory Visit: Payer: Self-pay

## 2021-06-09 ENCOUNTER — Telehealth: Payer: Self-pay

## 2021-06-09 DIAGNOSIS — N390 Urinary tract infection, site not specified: Secondary | ICD-10-CM

## 2021-06-09 NOTE — Telephone Encounter (Signed)
Pt called and left VM in regards to possible UTI. Pt states that she thinks she could possibly have UTI. Symptoms include cramping, bladder hurts, stomach hurts and sometimes she has mucus in her urine. Per Michiel Cowboy, pt may drop off a urine sample for urinalysis and culture. Appt scheduled for sample drop off, pt confirmed and voiced understanding.

## 2021-06-10 ENCOUNTER — Ambulatory Visit: Payer: BC Managed Care – PPO | Admitting: Family

## 2021-06-10 ENCOUNTER — Other Ambulatory Visit: Payer: Self-pay | Admitting: Family Medicine

## 2021-06-10 ENCOUNTER — Other Ambulatory Visit: Payer: Self-pay

## 2021-06-10 ENCOUNTER — Other Ambulatory Visit: Payer: BC Managed Care – PPO

## 2021-06-10 ENCOUNTER — Ambulatory Visit: Payer: BC Managed Care – PPO | Admitting: Urology

## 2021-06-10 DIAGNOSIS — N390 Urinary tract infection, site not specified: Secondary | ICD-10-CM

## 2021-06-10 LAB — MICROSCOPIC EXAMINATION

## 2021-06-10 LAB — URINALYSIS, COMPLETE
Bilirubin, UA: NEGATIVE
Glucose, UA: NEGATIVE
Ketones, UA: NEGATIVE
Leukocytes,UA: NEGATIVE
Nitrite, UA: NEGATIVE
Protein,UA: NEGATIVE
Specific Gravity, UA: 1.015 (ref 1.005–1.030)
Urobilinogen, Ur: 0.2 mg/dL (ref 0.2–1.0)
pH, UA: 7 (ref 5.0–7.5)

## 2021-06-13 ENCOUNTER — Other Ambulatory Visit: Payer: Self-pay

## 2021-06-13 ENCOUNTER — Ambulatory Visit: Payer: BC Managed Care – PPO

## 2021-06-13 DIAGNOSIS — M2170 Unequal limb length (acquired), unspecified site: Secondary | ICD-10-CM

## 2021-06-13 NOTE — Progress Notes (Signed)
SITUATION Reason for Consult: Follow-up with custom foot orthotics Patient / Caregiver Report: Patient reports left insole is now too high  OBJECTIVE DATA History / Diagnosis:    ICD-10-CM   1. 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 Used height wedges to determine leg length discrepancy. Devices turned over for adjustment. All questions answered and concerns addressed.  PLAN Orthotics to be modified in [place] and transported back to [place] office for pickup at [employer]. Plan of care discussed with and agreed upon by patient / caregiver.

## 2021-06-14 LAB — CULTURE, URINE COMPREHENSIVE

## 2021-06-16 ENCOUNTER — Telehealth: Payer: Self-pay | Admitting: *Deleted

## 2021-06-16 NOTE — Telephone Encounter (Signed)
I left Cristina Wade a message informing her that her orthotics are here and she can pick them up at her convenience. ?

## 2021-06-20 ENCOUNTER — Ambulatory Visit
Admission: RE | Admit: 2021-06-20 | Discharge: 2021-06-20 | Disposition: A | Discharge status: Home / Self Care | Payer: BC Managed Care – PPO | Source: Ambulatory Visit | Attending: Obstetrics and Gynecology | Admitting: Obstetrics and Gynecology

## 2021-06-20 ENCOUNTER — Other Ambulatory Visit: Payer: Self-pay

## 2021-06-20 DIAGNOSIS — Z1231 Encounter for screening mammogram for malignant neoplasm of breast: Secondary | ICD-10-CM | POA: Insufficient documentation

## 2021-06-22 ENCOUNTER — Encounter: Payer: Self-pay | Admitting: Urology

## 2021-06-22 ENCOUNTER — Other Ambulatory Visit: Payer: Self-pay

## 2021-06-22 ENCOUNTER — Ambulatory Visit: Payer: BC Managed Care – PPO | Admitting: Urology

## 2021-06-22 VITALS — BP 111/67 | HR 64 | Ht 70.0 in | Wt 132.0 lb

## 2021-06-22 DIAGNOSIS — R102 Pelvic and perineal pain: Secondary | ICD-10-CM | POA: Diagnosis not present

## 2021-06-22 NOTE — Progress Notes (Unsigned)
06/22/2021 3:23 PM   Cristina Wade [birth year] [id number]  Referring provider: Allegra GranaArnett, Margaret G, FNP [address] [place] [id number]  Chief Complaint  Patient presents with   Other     Urological history: 1. High risk hematuria -non-smoker -work up 2015 -bilateral punctate stones -cysto 2019 NED -no reports of gross hematuria  2. Neurogenic bladder -UDS 2017 indicating nonobstructive urinary retention with poor bladder sensation and external sphincter dyssynergia -completed 12 weeks of PTNS -completed pelvic floor PT -manages with self cathing three times daily  3. Nephrolithiasis -non-contrast CT 2022 2 mm stone in the right kidney lower pole without hydronephrosis.  Three tiny stones in left kidney lower pole region without hydronephrosis  4. rUTI's -contributing factors of age, incomplete emptying, vaginal atrophy, chronic constipation,  -documented positive urine cultures over the last year  03/06/2020 Klebsiella pneumoniae  08/20/2020 citrobacter freundii  09/18/2020 proteus mirabilis  10/21/2020 citrobacter freundii  12/07/2020 staph epidermis/B-hemolytic strep  12/24/2020 Klebsiella pneumoniae  12/30/2020 Klebsiella pneumoniae/B-hemolytic strep  01/20/2021 multiple species      HPI: Cristina CorpusSharon Shipman is a 51 y.o. female who presents today to discuss her bladder.  She has been having bouts of pelvic pain and lower back pain, but she has been conscious at work note try not to strain her misuse  use her pelvic floor.    PMH: Past Medical History:  Diagnosis Date   Asthma    Chronic pelvic pain in female 2018   Cystitis    Cystocele    Endometrial polyp    Endometriosis    per pt report but never seen during surg   Frequent headaches    Gastritis    GERD (gastroesophageal reflux disease)    rare   Gross hematuria    History of kidney stones    Microscopic hematuria    Migraine    Migraines    Neuropathy    Osteopenia    Urinary  disorder    UTI (lower urinary tract infection)     Surgical History: Past Surgical History:  Procedure Laterality Date   COLONOSCOPY N/A 11/05/2014   Procedure: COLONOSCOPY;  Surgeon: Scot Junobert T Elliott, MD;  [facility];  Service: Endoscopy;  Laterality: N/A;   COLONOSCOPY  11/2016   Dr. Mechele CollinElliott   CYSTOSCOPY  2007   with biopsy    CYSTOSCOPY N/A 06/01/2016   Procedure: CYSTOSCOPY;  Surgeon: Vena AustriaAndreas Staebler, MD;  Location: [facility] ORS;  Service: Gynecology;  Laterality: N/A;   DIAGNOSTIC LAPAROSCOPY     DILATATION & CURETTAGE/HYSTEROSCOPY WITH MYOSURE N/A 04/13/2015   Procedure: DILATATION & CURETTAGE/HYSTEROSCOPY WITH MYOSURE/POLYPECTOMY;  Surgeon: Elenora Fenderhelsea C Ward, MD;  Location: [facility] ORS;  Service: Gynecology;  Laterality: N/A;   DILATATION & CURETTAGE/HYSTEROSCOPY WITH MYOSURE N/A 06/01/2016   Procedure: DILATATION & CURETTAGE/HYSTEROSCOPY WITH MYOSURE;  Surgeon: Vena AustriaAndreas Staebler, MD;  Location: [facility] ORS;  Service: Gynecology;  Laterality: N/A;   GUM SURGERY     laproscopy  2007   POLYPECTOMY     endometrial    Home Medications:  Allergies as of 06/22/2021       Reactions   Lac Bovis Other (See Comments)   Milk-related Compounds    Omeprazole Other (See Comments)   Pt reports "Chest pain, HA and Dysuria."   Penicillins Itching, Rash        Medication List        Accurate as of June 22, 2021  3:23 PM. If you have any questions, ask your nurse  or doctor.          STOP taking these medications    nitrofurantoin (macrocrystal-monohydrate) 100 MG capsule Commonly known as: MACROBID Stopped by: Bianka Liberati, PA-C       TAKE these medications    Azelastine HCl 137 MCG/SPRAY Soln SMARTSIG:2 Spray(s) Both Nares Twice Daily PRN   CALCIUM 1200 PO Take 2-3 tablets by mouth daily. Reported on 05/26/2015   mometasone 50 MCG/ACT nasal spray Commonly known as: NASONEX 2 sprays daily.   polyethylene glycol 17 g packet Commonly known as: MIRALAX /  GLYCOLAX Take 17 g by mouth daily.   Povidone-Iodine 7.5 % Swab Apply 1 applicator topically as needed.   pregabalin 100 MG capsule Commonly known as: LYRICA Take 100 mg by mouth 2 (two) times daily.   psyllium 0.52 g capsule Commonly known as: REGULOID Take 0.52 g by mouth daily.        Allergies:  Allergies  Allergen Reactions   Lac Bovis Other (See Comments)   Milk-Related Compounds    Omeprazole Other (See Comments)    Pt reports "Chest pain, HA and Dysuria."   Penicillins Itching and Rash    Family History: Family History  Problem Relation Age of Onset   Colon cancer Father 92   Arthritis Father    Hyperlipidemia Father    Transient ischemic attack Father    Cancer - Colon Father 33   Prostate cancer Father 57   Valvular heart disease Father    Arthritis Mother    Heart disease Mother    Stroke Mother        TIA   Hypertension Mother    Diabetes Mother    Kidney cancer Mother 25   Heart failure Mother    Hyperlipidemia Mother    Cancer Maternal Grandmother    Cancer Paternal Grandfather        prostate   Breast cancer Cousin 96       has contact    Social History:  reports that she has never smoked. She has never used smokeless tobacco. She reports that she does not drink alcohol and does not use drugs.  ROS: Pertinent ROS in HPI  Physical Exam: BP 111/67    Pulse 64    Ht 5\' 10"  (1.778 m)    Wt 132 lb (59.9 kg)    BMI 18.94 kg/m   Constitutional:  Well nourished. Alert and oriented, No acute distress. HEENT: [place] AT, mask in place.  Trachea midline Cardiovascular: No clubbing, cyanosis, or edema. Respiratory: Normal respiratory effort, no increased work of breathing. Neurologic: Grossly intact, no focal deficits, moving all 4 extremities. Psychiatric: Normal mood and affect.    Laboratory Data: Lab Results  Component Value Date   WBC 3.7 (L) 04/15/2021   HGB 13.8 04/15/2021   HCT 41.9 04/15/2021   MCV 90.1 04/15/2021   PLT 191.0  04/15/2021    Lab Results  Component Value Date   CREATININE 0.74 04/15/2021   Lab Results  Component Value Date   AST 19 04/15/2021   Lab Results  Component Value Date   ALT 17 04/15/2021   Urinalysis Results for orders placed or performed in visit on 06/10/21  CULTURE, URINE COMPREHENSIVE   Specimen: Urine   UR  Result Value Ref Range   Urine Culture, Comprehensive Final report    Organism ID, Bacteria Comment   Microscopic Examination   Urine  Result Value Ref Range   WBC, UA 0-5 0 - 5 /hpf  RBC 0-2 0 - 2 /hpf   Epithelial Cells (non renal) 0-10 0 - 10 /hpf   Casts Present (A) None seen /lpf   Cast Type Granular casts (A) N/A   Crystals Present (A) N/A   Crystal Type Amorphous Sediment N/A   Bacteria, UA Few None seen/Few  Urinalysis, Complete  Result Value Ref Range   Specific Gravity, UA 1.015 1.005 - 1.030   pH, UA 7.0 5.0 - 7.5   Color, UA Yellow Yellow   Appearance Ur Clear Clear   Leukocytes,UA Negative Negative   Protein,UA Negative Negative/Trace   Glucose, UA Negative Negative   Ketones, UA Negative Negative   RBC, UA Trace (A) Negative   Bilirubin, UA Negative Negative   Urobilinogen, Ur 0.2 0.2 - 1.0 mg/dL   Nitrite, UA Negative Negative   Microscopic Examination See below:     I have reviewed the labs.   Pertinent Imaging: N/A  In and Out Catheterization Patient is present today for a I & O catheterization due to rUTI's. Patient was cleaned and prepped in a sterile fashion with betadine . A 14 FR cath was inserted no complications were noted , 80 ml of urine return was noted, urine was yellow in color. A clean urine sample was collected for UA and urine culture.  Bladder was drained  And catheter was removed with out difficulty.    Performed by: Ples Specter, CMA   Assessment & Plan:    1.rUTI's -The issue still continues to be colonization versus recurrent UTIs and it is difficult to manage due to her limited medical knowledge and  also seeing other physicians for her urinary issues -At this time she is somewhat symptomatic with intermittent dysuria and chills and UA is nitrate positive with greater than 30 WBCs and many bacteria present -Urine is sent for culture -I will start on Macrobid 100 mg twice daily as we do have a long holiday for laboratory services this weekend -I will contact the catheter company to see if we can switch her catheter preps from perineal wipes to iodine wipes as she states she is well comfortable with these and she is experience less urinary tract infections with these  2. Neurogenic bladder -continue to CIC x 3 times daily -advised her to self cath when she feels lower abdominal pain  3. High risk hematuria -work up x 2 NED, most recent imaging 2022 -reports of gross hematuria -UA negative for micro heme                                              No follow-ups on file.  These notes generated with voice recognition software. I apologize for typographical errors.  Michiel Cowboy, PA-C  [facility] [address]  [address] [place], [place] [address] [phone]

## 2021-06-24 ENCOUNTER — Telehealth (INDEPENDENT_AMBULATORY_CARE_PROVIDER_SITE_OTHER): Payer: Self-pay | Admitting: Vascular Surgery

## 2021-06-24 NOTE — Telephone Encounter (Signed)
Pt called in wanting to schedule an appt with Dr. Delana Meyer. States that her feet are hurting, legs are tingling, and has some numbness. Please advise  ?

## 2021-06-24 NOTE — Telephone Encounter (Signed)
Spoke with pt - tried to schedule. She wants an afternoon appt - she stated that she would have to get her calender and call us back.  ? ?LS 2021. reflux + abi repeats. see gs. ?

## 2021-06-24 NOTE — Telephone Encounter (Signed)
We saw the patient several months ago and her studies showed no vascular abnormalities.  We don't treat patients for leg pain, but for vascular issues. She also has bad peripheral neuropathy which is not vasclar.  We can have the patient come back and redo the reflux studies and ABIs, however if those are normal, she will need to see her PCP for workup of what could be causing her pain.  She can see GS as schedule allows

## 2021-07-06 ENCOUNTER — Encounter: Payer: Self-pay | Admitting: Family

## 2021-07-06 NOTE — Progress Notes (Incomplete)
07/06/21 ?6:30 PM  ? ?Cristina Wade ?[birth year] ?[id number] ? ?Referring provider:  ?Allegra GranaArnett, Margaret G, FNP ?[phone] ?[address] ?[place],  [place] [id number] ?No chief complaint on file. ? ? ?Urological history  ?1. High risk hematuria ?-non-smoker ?-work up 2015 -bilateral punctate stones ?-cysto 2019 NED ?-no reports of gross hematuria ?  ?2. Neurogenic bladder ?-UDS 2017 indicating nonobstructive urinary retention with poor bladder sensation and external sphincter dyssynergia ?-completed 12 weeks of PTNS ?-completed pelvic floor PT ?-manages with self cathing three times daily ?  ?3. Nephrolithiasis ?-non-contrast CT 2022 2 mm stone in the right kidney lower pole without hydronephrosis.  Three tiny stones in left kidney lower pole region without hydronephrosis ?  ?4. rUTI's ?-contributing factors of age, incomplete emptying, vaginal atrophy, chronic constipation,  ?-documented positive urine cultures over the last year ?            03/06/2020 Klebsiella pneumoniae ?            08/20/2020 citrobacter freundii ?            09/18/2020 proteus mirabilis ?            10/21/2020 citrobacter freundii ?            12/07/2020 staph epidermis/B-hemolytic strep ?            12/24/2020 Klebsiella pneumoniae ?            12/30/2020 Klebsiella pneumoniae/B-hemolytic strep ?            01/20/2021 multiple species ?             04/26/2021  Citrobacter freundii ? ?5. Pelvic pain  ? ?HPI: ?Cristina Wade is a 51 y.o.female who presents today for further evaluation of UTI symptoms.  ? ? ? ? ? ?PMH: ?Past Medical History:  ?Diagnosis Date  ? Asthma   ? Chronic pelvic pain in female 2018  ? Cystitis   ? Cystocele   ? Endometrial polyp   ? Endometriosis   ? per pt report but never seen during surg  ? Frequent headaches   ? Gastritis   ? GERD (gastroesophageal reflux disease)   ? rare  ? Gross hematuria   ? History of kidney stones   ? Microscopic hematuria   ? Migraine   ? Migraines   ? Neuropathy   ? Osteopenia   ? Urinary disorder   ?  UTI (lower urinary tract infection)   ? ? ?Surgical History: ?Past Surgical History:  ?Procedure Laterality Date  ? COLONOSCOPY N/A 11/05/2014  ? Procedure: COLONOSCOPY;  Surgeon: Scot Junobert T Elliott, MD;  [facility];  Service: Endoscopy;  Laterality: N/A;  ? COLONOSCOPY  11/2016  ? Dr. Mechele CollinElliott  ? CYSTOSCOPY  2007  ? with biopsy   ? CYSTOSCOPY N/A 06/01/2016  ? Procedure: CYSTOSCOPY;  Surgeon: Vena AustriaAndreas Staebler, MD;  Location: [facility] ORS;  Service: Gynecology;  Laterality: N/A;  ? DIAGNOSTIC LAPAROSCOPY    ? DILATATION & CURETTAGE/HYSTEROSCOPY WITH MYOSURE N/A 04/13/2015  ? Procedure: DILATATION & CURETTAGE/HYSTEROSCOPY WITH MYOSURE/POLYPECTOMY;  Surgeon: Elenora Fenderhelsea C Ward, MD;  Location: [facility] ORS;  Service: Gynecology;  Laterality: N/A;  ? DILATATION & CURETTAGE/HYSTEROSCOPY WITH MYOSURE N/A 06/01/2016  ? Procedure: DILATATION & CURETTAGE/HYSTEROSCOPY WITH MYOSURE;  Surgeon: Vena AustriaAndreas Staebler, MD;  Location: [facility] ORS;  Service: Gynecology;  Laterality: N/A;  ? GUM SURGERY    ? laproscopy  2007  ? POLYPECTOMY    ? endometrial  ? ? ?Home Medications:  ?Allergies as  of 07/07/2021   ? ?   Reactions  ? Lac Bovis Other (See Comments)  ? Milk-related Compounds   ? Omeprazole Other (See Comments)  ? Pt reports "Chest pain, HA and Dysuria."  ? Penicillins Itching, Rash  ? ?  ? ?  ?Medication List  ?  ? ?  ? Accurate as of July 06, 2021  6:30 PM. If you have any questions, ask your nurse or doctor.  ?  ?  ? ?  ? ?Azelastine HCl 137 MCG/SPRAY Soln ?SMARTSIG:2 Spray(s) Both Nares Twice Daily PRN ?  ?CALCIUM 1200 PO ?Take 2-3 tablets by mouth daily. Reported on 05/26/2015 ?  ?mometasone 50 MCG/ACT nasal spray ?Commonly known as: NASONEX ?2 sprays daily. ?  ?polyethylene glycol 17 g packet ?Commonly known as: MIRALAX / GLYCOLAX ?Take 17 g by mouth daily. ?  ?Povidone-Iodine 7.5 % Swab ?Apply 1 applicator topically as needed. ?  ?pregabalin 100 MG capsule ?Commonly known as: LYRICA ?Take 100 mg by mouth 2 (two) times daily. ?   ?psyllium 0.52 g capsule ?Commonly known as: REGULOID ?Take 0.52 g by mouth daily. ?  ? ?  ? ? ?Allergies:  ?Allergies  ?Allergen Reactions  ? Lac Bovis Other (See Comments)  ? Milk-Related Compounds   ? Omeprazole Other (See Comments)  ?  Pt reports "Chest pain, HA and Dysuria."  ? Penicillins Itching and Rash  ? ? ?Family History: ?Family History  ?Problem Relation Age of Onset  ? Colon cancer Father 65  ? Arthritis Father   ? Hyperlipidemia Father   ? Transient ischemic attack Father   ? Cancer - Colon Father 42  ? Prostate cancer Father 13  ? Valvular heart disease Father   ? Arthritis Mother   ? Heart disease Mother   ? Stroke Mother   ?     TIA  ? Hypertension Mother   ? Diabetes Mother   ? Kidney cancer Mother 66  ? Heart failure Mother   ? Hyperlipidemia Mother   ? Cancer Maternal Grandmother   ? Cancer Paternal Grandfather   ?     prostate  ? Breast cancer Cousin 35  ?     has contact  ? ? ?Social History:  reports that she has never smoked. She has never used smokeless tobacco. She reports that she does not drink alcohol and does not use drugs. ? ? ?Physical Exam: ?There were no vitals taken for this visit.  ?Constitutional:  Alert and oriented, No acute distress. ?Constitutional:  Alert and oriented, No acute distress. ?HEENT: [place] AT, moist mucus membranes.  Trachea midline, no masses. ?Cardiovascular: No clubbing, cyanosis, or edema. ?Respiratory: Normal respiratory effort, no increased work of breathing. ?GI: Abdomen is soft, nontender, nondistended, no abdominal masses ?GU: No CVA tenderness ?Lymph: No cervical or inguinal lymphadenopathy. ?Skin: No rashes, bruises or suspicious lesions. ?Neurologic: Grossly intact, no focal deficits, moving all 4 extremities. ?Psychiatric: Normal mood and affect. ? ? ?Laboratory Data: ? ?Lab Results  ?Component Value Date  ? CREATININE 0.74 04/15/2021  ? ? ? ?Lab Results  ?Component Value Date  ? HGBA1C 5.8 04/15/2021  ? ? ?Urinalysis ? ? ?Pertinent  Imaging: ? ? ?Assessment & Plan:   ? ? ?No follow-ups on file. ? ? [facility] ?[address] ?[place], [place] [address] ?[phone] ? ?I,Kailey Littlejohn,acting as a Neurosurgeon for [employer], PA-C.,have documented all relevant documentation on the behalf of SHANNON MCGOWAN, PA-C,as directed by  [facility], PA-C  while in the presence of SHANNON MCGOWAN, PA-C. ? ?

## 2021-07-07 ENCOUNTER — Ambulatory Visit: Payer: BC Managed Care – PPO | Admitting: Urology

## 2021-07-29 ENCOUNTER — Encounter: Payer: Self-pay | Admitting: Family

## 2021-08-01 NOTE — Progress Notes (Deleted)
08/01/21 11:59 AM   Cristina Wade 05/23/1970 [id number]  Referring provider:  [name], FNP [address] [place],  [place] [address]  No chief complaint on file.  Urological history  1. High risk hematuria -non-smoker -work up 2015 -bilateral punctate stones -cysto 2019 NED -non-contrast CT 09/2020 - bilateral nephrolithiasis < 5 mm in size -no reports of gross hematuria -UA ***   2. Neurogenic bladder -contributing factors of transverse myelitis  -UDS 2017 indicating nonobstructive urinary retention with poor bladder sensation and external sphincter dyssynergia -completed 12 weeks of PTNS -completed pelvic floor PT -manages with self cathing three times daily -PVR ***   3. Nephrolithiasis -non-contrast CT 2022 2 mm stone in the right kidney lower pole without hydronephrosis.  Three tiny stones in left kidney lower pole region without hydronephrosis   4. rUTI's -contributing factors of age, incomplete emptying, vaginal atrophy, chronic constipation,  -documented positive urine cultures over the last year             08/20/2020 citrobacter freundii             09/18/2020 proteus mirabilis             10/21/2020 citrobacter freundii             12/07/2020 staph epidermis/B-hemolytic strep             12/24/2020 Klebsiella pneumoniae             12/30/2020 Klebsiella pneumoniae/B-hemolytic strep             01/20/2021 multiple species              04/26/2021  Citrobacter freundii  5. Pelvic pain  -?  Related to her transverse myelitis, constipation or large bladder residuals  HPI: Cristina Wade is a 51 y.o.female who presents today for further evaluation of UTI symptoms.   [facility] for symptoms of increased urinary frequency, dysuria, mild vaginal itching and a white thick nonodorous vaginal discharge.  Urinary dip at the urgent care was negative and her urine culture returned negative.  She was started on  Cipro and Diflucan at the urgent care clinic.  UA ***  PVR ***  KUB ***  PMH: Past Medical History:  Diagnosis Date   Asthma    Chronic pelvic pain in female 2018   Cystitis    Cystocele    Endometrial polyp    Endometriosis    per pt report but never seen during surg   Frequent headaches    Gastritis    GERD (gastroesophageal reflux disease)    rare   Gross hematuria    History of kidney stones    Microscopic hematuria    Migraine    Migraines    Neuropathy    Osteopenia    Urinary disorder    UTI (lower urinary tract infection)     Surgical History: Past Surgical History:  Procedure Laterality Date   COLONOSCOPY N/A 11/05/2014   Procedure: COLONOSCOPY;  Surgeon: Scot Jun, MD;  Location: [facility]COPY;  Service: Endoscopy;  Laterality: N/A;   COLONOSCOPY  11/2016   Dr. Mechele Collin   CYSTOSCOPY  2007   with biopsy    CYSTOSCOPY N/A 06/01/2016   Procedure: CYSTOSCOPY;  Surgeon: Vena Austria, MD;  Location: [facility] ORS;  Service: Gynecology;  Laterality: N/A;   DIAGNOSTIC LAPAROSCOPY     DILATATION & CURETTAGE/HYSTEROSCOPY WITH MYOSURE N/A 04/13/2015   Procedure: DILATATION &  CURETTAGE/HYSTEROSCOPY WITH MYOSURE/POLYPECTOMY;  Surgeon: Elenora Fender Ward, MD;  Location: [facility] ORS;  Service: Gynecology;  Laterality: N/A;   DILATATION & CURETTAGE/HYSTEROSCOPY WITH MYOSURE N/A 06/01/2016   Procedure: DILATATION & CURETTAGE/HYSTEROSCOPY WITH MYOSURE;  Surgeon: Vena Austria, MD;  Location: [facility] ORS;  Service: Gynecology;  Laterality: N/A;   GUM SURGERY     laproscopy  2007   POLYPECTOMY     endometrial    Home Medications:  Allergies as of 08/02/2021       Reactions   Lac Bovis Other (See Comments)   Milk-related Compounds    Omeprazole Other (See Comments)   Pt reports "Chest pain, HA and Dysuria."   Penicillins Itching, Rash        Medication List        Accurate as of August 01, 2021 11:59 AM. If you have any questions, ask your nurse or doctor.           Azelastine HCl 137 MCG/SPRAY Soln SMARTSIG:2 Spray(s) Both Nares Twice Daily PRN   CALCIUM 1200 PO Take 2-3 tablets by mouth daily. Reported on 05/26/2015   mometasone 50 MCG/ACT nasal spray Commonly known as: NASONEX 2 sprays daily.   polyethylene glycol 17 g packet Commonly known as: MIRALAX / GLYCOLAX Take 17 g by mouth daily.   Povidone-Iodine 7.5 % Swab Apply 1 applicator topically as needed.   pregabalin 100 MG capsule Commonly known as: LYRICA Take 100 mg by mouth 2 (two) times daily.   psyllium 0.52 g capsule Commonly known as: REGULOID Take 0.52 g by mouth daily.        Allergies:  Allergies  Allergen Reactions   Lac Bovis Other (See Comments)   Milk-Related Compounds    Omeprazole Other (See Comments)    Pt reports "Chest pain, HA and Dysuria."   Penicillins Itching and Rash    Family History: Family History  Problem Relation Age of Onset   Colon cancer Father 72   Arthritis Father    Hyperlipidemia Father    Transient ischemic attack Father    Cancer - Colon Father 74   Prostate cancer Father 19   Valvular heart disease Father    Arthritis Mother    Heart disease Mother    Stroke Mother        TIA   Hypertension Mother    Diabetes Mother    Kidney cancer Mother 59   Heart failure Mother    Hyperlipidemia Mother    Cancer Maternal Grandmother    Cancer Paternal Grandfather        prostate   Breast cancer Cousin 53       has contact    Social History:  reports that she has never smoked. She has never used smokeless tobacco. She reports that she does not drink alcohol and does not use drugs.   Physical Exam: There were no vitals taken for this visit.  Constitutional:  Well nourished. Alert and oriented, No acute distress. HEENT: [place] AT, moist mucus membranes.  Trachea midline, no masses. Cardiovascular: No clubbing, cyanosis, or edema. Respiratory: Normal respiratory effort, no increased work of breathing. GI: Abdomen is soft,  non tender, non distended, no abdominal masses. Liver and spleen not palpable.  No hernias appreciated.  Stool sample for occult testing is not indicated.   GU: No CVA tenderness.  No bladder fullness or masses.  *** external genitalia, *** pubic hair distribution, no lesions.  Normal urethral meatus, no lesions, no prolapse, no discharge.   No  urethral masses, tenderness and/or tenderness. No bladder fullness, tenderness or masses. *** vagina mucosa, *** estrogen effect, no discharge, no lesions, *** pelvic support, *** cystocele and *** rectocele noted.  No cervical motion tenderness.  Uterus is freely mobile and non-fixed.  No adnexal/parametria masses or tenderness noted.  Anus and perineum are without rashes or lesions.   ***  Skin: No rashes, bruises or suspicious lesions. Lymph: No cervical or inguinal adenopathy. Neurologic: Grossly intact, no focal deficits, moving all 4 extremities. Psychiatric: Normal mood and affect.    Laboratory Data: Urinalysis *** I have reviewed the labs.   Pertinent Imaging: *** I have independently reviewed the films.  See HPI.    Assessment & Plan:    1. Lower urinary tract symptoms ***  2. Nephrolithiasis -KUB ***  No follow-ups on file.  Return if symptoms worsen or fail to improve.   These notes generated with voice recognition software. I apologize for typographical errors.   Michiel CowboySHANNON Chelci [place], PA-C   [facility] [address], [address] [place], [place] [address] [phone]

## 2021-08-02 ENCOUNTER — Other Ambulatory Visit: Payer: Self-pay

## 2021-08-02 ENCOUNTER — Ambulatory Visit: Payer: BC Managed Care – PPO | Admitting: Physician Assistant

## 2021-08-02 ENCOUNTER — Ambulatory Visit: Payer: BC Managed Care – PPO | Admitting: Urology

## 2021-08-02 DIAGNOSIS — R102 Pelvic and perineal pain: Secondary | ICD-10-CM

## 2021-08-02 DIAGNOSIS — R399 Unspecified symptoms and signs involving the genitourinary system: Secondary | ICD-10-CM

## 2021-08-02 DIAGNOSIS — N200 Calculus of kidney: Secondary | ICD-10-CM

## 2021-08-03 ENCOUNTER — Encounter: Payer: Self-pay | Admitting: Urology

## 2021-08-24 ENCOUNTER — Encounter: Payer: Self-pay | Admitting: Family

## 2021-08-25 ENCOUNTER — Telehealth: Payer: Self-pay | Admitting: Family

## 2021-08-25 NOTE — Telephone Encounter (Signed)
Pt called in requesting lab orders... No current lab/follow up appointment in system... Pt requesting callback...  ?

## 2021-08-29 NOTE — Telephone Encounter (Signed)
Patient called to schedule appointment for labs.  Please let us know when orders have been entered so we can schedule the appointment. ?

## 2021-08-30 NOTE — Telephone Encounter (Signed)
LABS ORDERED.

## 2021-08-31 ENCOUNTER — Encounter: Payer: Self-pay | Admitting: Family

## 2021-08-31 NOTE — Telephone Encounter (Addendum)
Spoke to patient and scheduled an appointment on June 7th ?

## 2021-09-07 ENCOUNTER — Encounter: Payer: Self-pay | Admitting: Family

## 2021-09-08 ENCOUNTER — Other Ambulatory Visit: Payer: Self-pay

## 2021-09-08 ENCOUNTER — Encounter: Payer: Self-pay | Admitting: Family

## 2021-09-08 DIAGNOSIS — Z0000 Encounter for general adult medical examination without abnormal findings: Secondary | ICD-10-CM

## 2021-09-14 ENCOUNTER — Ambulatory Visit: Payer: BC Managed Care – PPO | Admitting: Urology

## 2021-09-15 ENCOUNTER — Ambulatory Visit: Payer: BC Managed Care – PPO | Admitting: Urology

## 2021-09-15 ENCOUNTER — Encounter: Payer: Self-pay | Admitting: Urology

## 2021-09-15 VITALS — BP 114/71 | HR 61 | Ht 70.0 in | Wt 132.0 lb

## 2021-09-15 DIAGNOSIS — R102 Pelvic and perineal pain: Secondary | ICD-10-CM

## 2021-09-15 DIAGNOSIS — R300 Dysuria: Secondary | ICD-10-CM | POA: Diagnosis not present

## 2021-09-15 DIAGNOSIS — R3989 Other symptoms and signs involving the genitourinary system: Secondary | ICD-10-CM

## 2021-09-15 NOTE — Progress Notes (Signed)
In and Out Catheterization  Patient is present today for a I & O catheterization due to dysuria. Patient was cleaned and prepped in a sterile fashion with betadine . A 14FR cath was inserted no complications were noted , 57mL of urine return was noted, urine was clear pale yellow in color. A clean urine sample was collected for urinalysis and culture. Bladder was drained  And catheter was removed with out difficulty.    Performed by: Franchot Erichsen CMA

## 2021-09-15 NOTE — Progress Notes (Signed)
09/15/21 4:33 PM   [name] [birth year] [id number]  Referring provider:  [name], FNP [address] [place],  [place] [address]  Urological history  1. High risk hematuria -non-smoker -work up 2015 -bilateral punctate stones -cysto 2019 NED -non-contrast CT 2022 - bilateral punctate stones -cysto, 2022 - NED   2. Neurogenic bladder -UDS 2017 indicating nonobstructive urinary retention with poor bladder sensation and external sphincter dyssynergia -completed 12 weeks of PTNS -completed pelvic floor PT -manages with self cathing three times daily   3. Nephrolithiasis -non-contrast CT 2022 2 mm stone in the right kidney lower pole without hydronephrosis.  Three tiny stones in left kidney lower pole region without hydronephrosis   4. rUTI's -contributing factors of age, incomplete emptying, vaginal atrophy, chronic constipation,  -documented positive urine cultures over the last year             09/18/2020 proteus mirabilis             10/21/2020 citrobacter freundii             12/07/2020 staph epidermis/B-hemolytic strep             12/24/2020 Klebsiella pneumoniae             12/30/2020 Klebsiella pneumoniae/B-hemolytic strep             01/20/2021 multiple species             04/15/2021 streptococcus agalactiae   04/26/2021 Citrobacter freundii   Chief Complaint  Patient presents with   Dysuria      HPI: Cristina Wade is a 51 y.o.female who presents today for a cath UA to check for UTI.    She reports that when she increases her fluid intake, she has pelvic pain.  She is concerned that she may have an UTI.   Patient denies any modifying or aggravating factors.  Patient denies any gross hematuria, dysuria or suprapubic/flank pain.  Patient denies any fevers, chills, nausea or vomiting.   CATH UA today shows 3-10 RBCs.   PMH: Past Medical History:  Diagnosis Date   Asthma    Chronic pelvic pain in female 2018   Cystitis    Cystocele     Endometrial polyp    Endometriosis    per pt report but never seen during surg   Frequent headaches    Gastritis    GERD (gastroesophageal reflux disease)    rare   Gross hematuria    History of kidney stones    Microscopic hematuria    Migraine    Migraines    Neuropathy    Osteopenia    Urinary disorder    UTI (lower urinary tract infection)     Surgical History: Past Surgical History:  Procedure Laterality Date   COLONOSCOPY N/A 11/05/2014   Procedure: COLONOSCOPY;  Surgeon: Scot Jun, MD;  Location: [facility] ENDOSCOPY;  Service: Endoscopy;  Laterality: N/A;   COLONOSCOPY  11/2016   Dr. Mechele Collin   CYSTOSCOPY  2007   with biopsy    CYSTOSCOPY N/A 06/01/2016   Procedure: CYSTOSCOPY;  Surgeon: Vena Austria, MD;  Location: [facility] ORS;  Service: Gynecology;  Laterality: N/A;   DIAGNOSTIC LAPAROSCOPY     DILATATION & CURETTAGE/HYSTEROSCOPY WITH MYOSURE N/A 04/13/2015   Procedure: DILATATION & CURETTAGE/HYSTEROSCOPY WITH MYOSURE/POLYPECTOMY;  Surgeon: Elenora Fender Ward, MD;  Location: [facility] ORS;  Service: Gynecology;  Laterality: N/A;   DILATATION & CURETTAGE/HYSTEROSCOPY WITH MYOSURE N/A 06/01/2016   Procedure: DILATATION & CURETTAGE/HYSTEROSCOPY WITH  MYOSURE;  Surgeon: Vena AustriaAndreas Staebler, MD;  Location: [facility] ORS;  Service: Gynecology;  Laterality: N/A;   GUM SURGERY     laproscopy  2007   POLYPECTOMY     endometrial    Home Medications:  Allergies as of 09/15/2021       Reactions   Lac Bovis Other (See Comments)   Milk-related Compounds    Omeprazole Other (See Comments)   Pt reports "Chest pain, HA and Dysuria."   Penicillins Itching, Rash        Medication List        Accurate as of September 15, 2021  4:33 PM. If you have any questions, ask your nurse or doctor.          Azelastine HCl 137 MCG/SPRAY Soln SMARTSIG:2 Spray(s) Both Nares Twice Daily PRN   CALCIUM 1200 PO Take 2-3 tablets by mouth daily. Reported on 05/26/2015   mometasone 50 MCG/ACT nasal  spray Commonly known as: NASONEX 2 sprays daily.   polyethylene glycol 17 g packet Commonly known as: MIRALAX / GLYCOLAX Take 17 g by mouth daily.   Povidone-Iodine 7.5 % Swab Apply 1 applicator topically as needed.   pregabalin 100 MG capsule Commonly known as: LYRICA Take 100 mg by mouth 2 (two) times daily.   psyllium 0.52 g capsule Commonly known as: REGULOID Take 0.52 g by mouth daily.        Allergies:  Allergies  Allergen Reactions   Lac Bovis Other (See Comments)   Milk-Related Compounds    Omeprazole Other (See Comments)    Pt reports "Chest pain, HA and Dysuria."   Penicillins Itching and Rash    Family History: Family History  Problem Relation Age of Onset   Colon cancer Father [id number]   Arthritis Father    Hyperlipidemia Father    Transient ischemic attack Father    Cancer - Colon Father 155   Prostate cancer Father 5268   Valvular heart disease Father    Arthritis Mother    Heart disease Mother    Stroke Mother        TIA   Hypertension Mother    Diabetes Mother    Kidney cancer Mother 1145   Heart failure Mother    Hyperlipidemia Mother    Cancer Maternal Grandmother    Cancer Paternal Grandfather        prostate   Breast cancer Cousin 2435       has contact    Social History:  reports that she has never smoked. She has never used smokeless tobacco. She reports that she does not drink alcohol and does not use drugs.   Physical Exam: BP 114/71   Pulse 61   Ht 5\' 10"  (1.778 m)   Wt 132 lb (59.9 kg)   BMI 18.94 kg/m   Constitutional:  Alert and oriented, No acute distress. HEENT:  AT, moist mucus membranes.  Trachea midline Cardiovascular: No clubbing, cyanosis, or edema. Respiratory: Normal respiratory effort, no increased work of breathing. Neurologic: Grossly intact, no focal deficits, moving all 4 extremities. Psychiatric: Normal mood and affect.   Laboratory Data: Urinalysis 3-10 RBCs but otherwise unremarkable per higher prior  field.  I have reviewed the labs.   Assessment & Plan:   Suspected UTI/ pelvic pain - Her UA shows 3-10 RBCs but otherwise unremarkable today. Her symptoms are exasperated when she has an increased water intake. Recommend she cut back on water intake and balance it out with cranberry juice.  - Urine  sent for culture to rule out any underlying infection   Return for pending urine culture results .  [facility] [address], [address] [place], [place] [address] [phone]  I, Clinton Sawyer Littlejohn,acting as a scribe for [facility], PA-C.,have documented all relevant documentation on the behalf of Robbin Loughmiller, PA-C,as directed by  [facility], PA-C while in the presence of Erica Richwine, PA-C.  I have reviewed the above documentation for accuracy and completeness, and I agree with the above.    Michiel Cowboy, PA-C

## 2021-09-16 LAB — MICROSCOPIC EXAMINATION

## 2021-09-16 LAB — URINALYSIS, COMPLETE
Bilirubin, UA: NEGATIVE
Glucose, UA: NEGATIVE
Ketones, UA: NEGATIVE
Leukocytes,UA: NEGATIVE
Nitrite, UA: NEGATIVE
Protein,UA: NEGATIVE
Specific Gravity, UA: 1.015 (ref 1.005–1.030)
Urobilinogen, Ur: 0.2 mg/dL (ref 0.2–1.0)
pH, UA: 7 (ref 5.0–7.5)

## 2021-09-19 ENCOUNTER — Telehealth: Payer: Self-pay | Admitting: Internal Medicine

## 2021-09-19 ENCOUNTER — Telehealth: Payer: Self-pay

## 2021-09-19 LAB — CULTURE, URINE COMPREHENSIVE

## 2021-09-19 NOTE — Telephone Encounter (Signed)
Notified pt via mychart

## 2021-09-19 NOTE — Telephone Encounter (Signed)
I spoke with the patient. She states that she has been having intermittent headaches/ intermittent left sided chest pain when she rolls over.   The patient had a Cardiac CT in 12/2019 that was normal and showed a Calcium score of "0."  The patient endorses that she has been under some stress lately as well.  I have advised her that headaches can be a result of stress.  She is also aware that if her pain in her left side only occurs when she rolls over, then this is most likely musculoskeletal in nature.   I have offered the patient to be seen in the office if that would make her more comfortable, but advised her I do not feel like her symptoms are cardiac in nature at this time.   The patient advised she will continue to monitor her symptoms and call back for an appointment if needed.  She was appreciative of the call back.

## 2021-09-19 NOTE — Telephone Encounter (Signed)
RN receiving multiple secure chat messages at the same time & on the line with another patient. Was not able to review until now.  Attempted to call the patient. No answer- I left a message on her identified voice mail that I will try to call her back in a few minutes.

## 2021-09-19 NOTE — Telephone Encounter (Signed)
Called both triage numbers with no answer and secure chatted RN with no response. Please return call ASAP.

## 2021-09-19 NOTE — Telephone Encounter (Signed)
Pt c/o of Chest Pain: STAT if CP now or developed within 24 hours  1. Are you having CP right now?  Yes, on the left side of her chest   2. Are you experiencing any other symptoms (ex. SOB, nausea, vomiting, sweating)?  States she gets hot, headaches  3. How long have you been experiencing CP?  Pain on left side chest bout 1 week  4. Is your CP continuous or coming and going?  Coming and going  5. Have you taken Nitroglycerin?  No  ?

## 2021-09-19 NOTE — Telephone Encounter (Signed)
-----   Message from Harle Battiest, PA-C sent at 09/19/2021  4:05 PM EDT ----- Please let Mrs. Linch know that her urine culture was negative.

## 2021-09-21 ENCOUNTER — Ambulatory Visit: Payer: BC Managed Care – PPO | Admitting: Family

## 2021-10-06 ENCOUNTER — Ambulatory Visit: Payer: BC Managed Care – PPO | Admitting: Urology

## 2021-10-06 ENCOUNTER — Encounter: Payer: Self-pay | Admitting: Urology

## 2021-10-06 VITALS — BP 113/72 | HR 67 | Temp 97.7°F | Ht 70.0 in | Wt 132.0 lb

## 2021-10-06 DIAGNOSIS — N3010 Interstitial cystitis (chronic) without hematuria: Secondary | ICD-10-CM | POA: Diagnosis not present

## 2021-10-06 NOTE — Progress Notes (Signed)
In and Out Catheterization  Patient is present today for a I & O catheterization due to bladder pain. Patient was cleaned and prepped in a sterile fashion with betadine . A 14FR cath was inserted no complications were noted , 21ml of urine return was noted, urine was clear pale yellow in color. A clean urine sample was collected for UA & Culture. Bladder was drained  And catheter was removed with out difficulty.    Performed by: Franchot Erichsen CMA

## 2021-10-07 LAB — URINALYSIS, COMPLETE
Bilirubin, UA: NEGATIVE
Glucose, UA: NEGATIVE
Ketones, UA: NEGATIVE
Leukocytes,UA: NEGATIVE
Nitrite, UA: NEGATIVE
Protein,UA: NEGATIVE
Specific Gravity, UA: 1.01 (ref 1.005–1.030)
Urobilinogen, Ur: 0.2 mg/dL (ref 0.2–1.0)
pH, UA: 7 (ref 5.0–7.5)

## 2021-10-07 LAB — MICROSCOPIC EXAMINATION
Bacteria, UA: NONE SEEN
Epithelial Cells (non renal): NONE SEEN /hpf (ref 0–10)

## 2021-10-11 ENCOUNTER — Telehealth: Payer: Self-pay | Admitting: Internal Medicine

## 2021-10-11 ENCOUNTER — Emergency Department
Admission: EM | Admit: 2021-10-11 | Discharge: 2021-10-11 | Disposition: A | Discharge status: Home / Self Care | Payer: BC Managed Care – PPO | Attending: Emergency Medicine | Admitting: Emergency Medicine

## 2021-10-11 ENCOUNTER — Other Ambulatory Visit: Payer: Self-pay

## 2021-10-11 ENCOUNTER — Emergency Department: Payer: BC Managed Care – PPO

## 2021-10-11 DIAGNOSIS — R0789 Other chest pain: Secondary | ICD-10-CM | POA: Diagnosis not present

## 2021-10-11 DIAGNOSIS — J45909 Unspecified asthma, uncomplicated: Secondary | ICD-10-CM | POA: Insufficient documentation

## 2021-10-11 DIAGNOSIS — R079 Chest pain, unspecified: Secondary | ICD-10-CM

## 2021-10-11 LAB — BASIC METABOLIC PANEL
Anion gap: 8 (ref 5–15)
BUN: 13 mg/dL (ref 6–20)
CO2: 25 mmol/L (ref 22–32)
Calcium: 9.7 mg/dL (ref 8.9–10.3)
Chloride: 109 mmol/L (ref 98–111)
Creatinine, Ser: 0.76 mg/dL (ref 0.44–1.00)
GFR, Estimated: >60 mL/min (ref >60)
Glucose, Bld: 103 mg/dL — ABNORMAL HIGH (ref 70–99)
Potassium: 3.7 mmol/L (ref 3.5–5.1)
Sodium: 142 mmol/L (ref 135–145)

## 2021-10-11 LAB — CBC
HCT: 40.7 % (ref 36.0–46.0)
Hemoglobin: 13.5 g/dL (ref 12.0–15.0)
MCH: 29.6 pg (ref 26.0–34.0)
MCHC: 33.2 g/dL (ref 30.0–36.0)
MCV: 89.3 fL (ref 80.0–100.0)
Platelets: 192 10*3/uL (ref 150–400)
RBC: 4.56 MIL/uL (ref 3.87–5.11)
RDW: 11.9 % (ref 11.5–15.5)
WBC: 5.9 10*3/uL (ref 4.0–10.5)
nRBC: 0 % (ref 0.0–0.2)

## 2021-10-11 LAB — CULTURE, URINE COMPREHENSIVE

## 2021-10-11 LAB — TROPONIN I (HIGH SENSITIVITY): Troponin I (High Sensitivity): 3 ng/L (ref <18)

## 2021-10-11 NOTE — ED Notes (Signed)
Pt leaving for imaging.

## 2021-10-11 NOTE — Telephone Encounter (Signed)
Pt currently in ER at [facility].

## 2021-10-11 NOTE — ED Provider Notes (Signed)
[facility] Provider Note    Event Date/Time   First MD Initiated Contact with Patient 10/11/21 1456     (approximate)   History   Chest Pain   HPI  Cristina Wade is a 51 y.o. female past medical history of chronic pelvic pain, asthma migraines who presents with chest pain.  Patient says that symptoms have been going on since June 4.  She endorses pressure-like sensation the left side of her chest does not radiate.  It is intermittent no clear exacerbating or alleviating factor.  Is nonexertional nonpleuritic.  She occasionally feels short of breath when she is exercising but pain is not brought on by this.  She denies cough does felt like she has been intermittently hot over the last week.  No lower extremity swelling or pain.  No history of DVT/PE.  Patient has seen Dr. Okey Dupre in the past for chest pain.  She underwent a coronary CTA that was normal.  Echo showed normal LV function.  Past Medical History:  Diagnosis Date   Asthma    Chronic pelvic pain in female 2018   Cystitis    Cystocele    Endometrial polyp    Endometriosis    per pt report but never seen during surg   Frequent headaches    Gastritis    GERD (gastroesophageal reflux disease)    rare   Gross hematuria    History of kidney stones    Microscopic hematuria    Migraine    Migraines    Neuropathy    Osteopenia    Urinary disorder    UTI (lower urinary tract infection)     Patient Active Problem List   Diagnosis Date Noted   Chills (without fever) 04/15/2021   Dysuria 08/20/2020   Neurogenic bladder 08/20/2020   Pelvic pain in female 12/25/2019   Chronic low back pain 12/25/2019   Chronic venous insufficiency 12/24/2019   Lymphedema 12/24/2019   Lower extremity edema 12/03/2019   HLD (hyperlipidemia) 10/10/2019   Low back pain radiating to left leg 10/04/2019   Dyspnea on exertion 09/20/2019   Orthopnea 09/20/2019   Chronic abdominal pain 05/14/2019   Irritable bowel  syndrome with constipation 05/14/2019   Chronic constipation 12/25/2018   Urinary retention 08/12/2018   Anxiety 08/12/2018   Elevated blood pressure reading without diagnosis of hypertension 07/12/2018   Left leg pain 07/12/2018   Acute left-sided low back pain 06/03/2018   Cloudy urine 04/29/2018   Chest pain 04/29/2018   Frontal headache 04/27/2018   Localized, primary osteoarthritis 02/07/2018   Pelvic pain 01/30/2018   Slow transit constipation 10/10/2017   Bilateral impacted cerumen 11/28/2016   Chronic bilateral low back pain without sciatica 11/28/2016   Precordial pain 07/07/2016   Right bundle branch block (RBBB) on electrocardiogram (ECG) 07/07/2016   Chest tightness 06/27/2016   Preventative health care 09/30/2015   Encounter to establish care 09/28/2014   Vestibular migraine 09/28/2014   Routine physical examination 11/21/2013     Physical Exam  Triage Vital Signs: ED Triage Vitals  Enc Vitals Group     BP 10/11/21 1445 134/80     Pulse Rate 10/11/21 1445 82     Resp 10/11/21 1445 16     Temp 10/11/21 1445 98.4 F (36.9 C)     Temp Source 10/11/21 1445 Oral     SpO2 10/11/21 1445 96 %     Weight 10/11/21 1446 131 lb 13.4 oz (59.8 kg)  Height 10/11/21 1446 5\' 10"  (1.778 m)     Head Circumference --      Peak Flow --      Pain Score 10/11/21 1445 8     Pain Loc --      Pain Edu? --      Excl. in GC? --     Most recent vital signs: Vitals:   10/11/21 1630 10/11/21 1645  BP: 132/77   Pulse: 62   Resp: 13 17  Temp:    SpO2: 100%      General: Awake, no distress.  CV:  Good peripheral perfusion.  Resp:  Normal effort.  Clear Abd:  No distention.  Neuro:             Awake, Alert, Oriented x 3  Other:  No peripheral edema   ED Results / Procedures / Treatments  Labs (all labs ordered are listed, but only abnormal results are displayed) Labs Reviewed  BASIC METABOLIC PANEL - Abnormal; Notable for the following components:      Result Value    Glucose, Bld 103 (*)    All other components within normal limits  CBC  POC URINE PREG, ED  TROPONIN I (HIGH SENSITIVITY)     EKG  EKG interpretation performed by myself: NSR, nml axis, nml intervals, no acute ischemic changes   RADIOLOGY I reviewed and interpreted the CXR which does not show any acute cardiopulmonary process    PROCEDURES:  Critical Care performed: No  .1-3 Lead EKG Interpretation  Performed by: Georga Hacking, MD Authorized by: Georga Hacking, MD     Interpretation: normal     ECG rate assessment: normal     Rhythm: sinus rhythm     Ectopy: none     Conduction: normal     The patient is on the cardiac monitor to evaluate for evidence of arrhythmia and/or significant heart rate changes.   MEDICATIONS ORDERED IN ED: Medications - No data to display   IMPRESSION / MDM / ASSESSMENT AND PLAN / ED COURSE  I reviewed the triage vital signs and the nursing notes.                              Patient's presentation is most consistent with  acute presentation with potential threat to life or bodily function. Differential diagnosis includes, but is not limited to, acute coronary syndrome, pericarditis/myocarditis, pulmonary embolism, pleurisy, musculoskeletal, GERD  The patient is a 51 year old female no known coronary disease presents with about 1 month of chest pain.  It is nonexertional pressure-like without associated symptoms.  She occasionally feels dyspneic during exercise but pain is not brought on by this.  Her vital signs are within normal limits she looks well EKG is similar to prior without ischemic changes chest x-ray also normal.  We will send cardiac enzymes.  I think if her enzymes are normal and pain is improved that she can follow-up with Dr. Okey Dupre.  She had a negative coronary CTA 2 years ago putting her at lower risk for     Patient's labs are reassuring.  Troponin is 3.  With no risk factors symptoms been going on for almost a  month negative troponin and nonischemic EKG I think she is appropriate for outpatient follow-up and discharge.  FINAL CLINICAL IMPRESSION(S) / ED DIAGNOSES   Final diagnoses:  Nonspecific chest pain     Rx / DC Orders   ED  Discharge Orders     None        Note:  This document was prepared using Dragon voice recognition software and may include unintentional dictation errors.   Georga Hacking, MD 10/11/21 [phone]

## 2021-10-13 LAB — HM COLONOSCOPY

## 2021-10-14 ENCOUNTER — Encounter: Payer: Self-pay | Admitting: Family

## 2021-10-14 NOTE — Telephone Encounter (Signed)
Spoke to patient and advised her to go  to [facility] or ED because we did not have any appt here at our office for at least a week and a half out. I explained to her that I did not think she should wait until that far out because of her symptoms. Patient verbalized understanding. Did not specify if she would go to UC or ED to be seen.

## 2021-10-17 ENCOUNTER — Other Ambulatory Visit: Payer: Self-pay

## 2021-10-17 ENCOUNTER — Encounter: Payer: Self-pay | Admitting: Family

## 2021-10-17 MED ORDER — ESCITALOPRAM OXALATE 10 MG PO TABS: 10.0000 mg | ORAL_TABLET | Freq: Every day | ORAL | 1 refills | Status: DC

## 2021-10-17 NOTE — Telephone Encounter (Signed)
Spoke to patient and informed her that she needs to at least be taking the medication for about 3 weeks per [facility] for it to really see some progress from it.Patient verbalized understanding. Patient sated that she would continue taking the medication and see how she felt

## 2021-10-19 ENCOUNTER — Ambulatory Visit: Payer: BC Managed Care – PPO | Admitting: Internal Medicine

## 2021-10-19 ENCOUNTER — Telehealth: Payer: Self-pay | Admitting: Internal Medicine

## 2021-10-19 NOTE — Telephone Encounter (Signed)
It is fine to prescribe NTG 0.4 mg SL every 5 minutes as needed for pain pending appointment in our office.  Yvonne Kendall, MD [facility]

## 2021-10-19 NOTE — Telephone Encounter (Signed)
Pt was discharged from ER 10/11/21 for chest pain. Pt was added to schedule today for hospital follow up with Dr. Okey Dupre that was rescheduled to this Friday with Cadence Furth, PA-C.   Called and spoke with pt. Pt is requesting nitroglycerin to have on hand to take as needed for chest pain. Pt states that she has chest pain "almost every day". Chest pain present now and per pt is the same as she experienced in hospital as well as how she feels almost daily. Denies shortness of breath or any other associated symptoms at this time.   Pt understands ER precautions.   Advised pt can discuss medications at upcoming office visit, however, will ask Dr. Okey Dupre if nitroglycerin prescription would be appropriate as she has had negative cardiac work up so far.

## 2021-10-19 NOTE — Telephone Encounter (Signed)
Patient calling in to see if she can get nitroglycerin. Please advise

## 2021-10-20 ENCOUNTER — Telehealth: Payer: Self-pay | Admitting: Internal Medicine

## 2021-10-20 ENCOUNTER — Encounter: Payer: Self-pay | Admitting: Family

## 2021-10-20 MED ORDER — NITROGLYCERIN 0.4 MG SL SUBL: 0.4000 mg | SUBLINGUAL_TABLET | SUBLINGUAL | 0 refills | Status: DC | PRN

## 2021-10-20 NOTE — Telephone Encounter (Signed)
NTG 0.4 mg SL every 5 minutes as needed for chest pain Rx sent to pt's pharmacy.  Called pt, no answer. Left detailed message (ok per DPR). Notified Rx sent and instructions given.  Asked pt to call back with any further questions. Pt to follow up tomorrow as scheduled.

## 2021-10-20 NOTE — Telephone Encounter (Signed)
Pt returned call. Discussed in detail message below.  Pt voices understanding of PRN nitroglycerin.  Pt also reports that she is going to speak with her PCP about new start of Lexapro possibly contributing to her "chest pain" symptoms.  Pt will follow up as scheduled tomorrow in our office.

## 2021-10-20 NOTE — Telephone Encounter (Signed)
Called and spoke with pt. Pt asked if it was advised that she come for her follow up appointment that is scheduled tomorrow. I advised that pt does follow up as scheduled as this is a hospital follow up. Pt appreciative and states this is mainly what she wanted to ask. Pt states that she did have some nausea with headache earlier but this has mostly subsided at this time. Pt states she has no further needs at this time.

## 2021-10-20 NOTE — Telephone Encounter (Signed)
Pt c/o of Chest Pain: STAT if CP now or developed within 24 hours  1. Are you having CP right now? No  2. Are you experiencing any other symptoms (ex. SOB, nausea, vomiting, sweating)? Nausea and headaches  3. How long have you been experiencing CP?  10/11/21  4. Is your CP continuous or coming and going? Come and go  5. Have you taken Nitroglycerin? No    ?

## 2021-10-21 ENCOUNTER — Encounter: Payer: Self-pay | Admitting: Family

## 2021-10-21 ENCOUNTER — Ambulatory Visit: Payer: BC Managed Care – PPO | Admitting: Medical

## 2021-10-21 ENCOUNTER — Encounter: Payer: Self-pay | Admitting: Medical

## 2021-10-21 VITALS — BP 112/72 | HR 71 | Ht 70.0 in | Wt 127.6 lb

## 2021-10-21 DIAGNOSIS — R079 Chest pain, unspecified: Secondary | ICD-10-CM

## 2021-10-21 NOTE — Telephone Encounter (Signed)
Spoke to patient in regards to her weaning off the Lexapro and just take 5mg  for the next 2-3 days and then she should stop completely and discuss medication on tues at her appt with Dr !

## 2021-10-21 NOTE — Patient Instructions (Signed)
Medication Instructions:   Your physician recommends that you continue on your current medications as directed. Please refer to the Current Medication list given to you today.   *If you need a refill on your cardiac medications before your next appointment, please call your pharmacy*   Lab Work: None ordered  If you have labs (blood work) drawn today and your tests are completely normal, you will receive your results only by: MyChart Message (if you have MyChart) OR A paper copy in the mail If you have any lab test that is abnormal or we need to change your treatment, we will call you to review the results.   Testing/Procedures: None ordered   Follow-Up: At [facility], you and your health needs are our priority.  As part of our continuing mission to provide you with exceptional heart care, we have created designated Provider Care Teams.  These Care Teams include your primary Cardiologist (physician) and Advanced Practice Providers (APPs -  Physician Assistants and Nurse Practitioners) who all work together to provide you with the care you need, when you need it.  We recommend signing up for the patient portal called "MyChart".  Sign up information is provided on this After Visit Summary.  MyChart is used to connect with patients for Virtual Visits (Telemedicine).  Patients are able to view lab/test results, encounter notes, upcoming appointments, etc.  Non-urgent messages can be sent to your provider as well.   To learn more about what you can do with MyChart, go to [email or web address]://www.mychart.com.    Your next appointment:    Your physician wants you to follow-up in: 1 year.   You will receive a reminder letter in the mail two months in advance. If you don't receive a letter, please call our office to schedule the follow-up appointment.   The format for your next appointment:   In Person  Provider:   You may see Christopher End, MD or one of the following Advanced Practice Providers  on your designated Care Team:   Christopher Berge, NP Ryan Dunn, PA-C Cadence Furth, PA-C   Other Instructions N/A  Important Information About Sugar       

## 2021-10-21 NOTE — Progress Notes (Signed)
Cardiology Office Note:    Date:  10/21/2021   ID:  Cristina Wade, DOB 10-31-1970, MRN [id number]  PCP:  [name], FNP  [facility] Cardiologist:  Yvonne Kendall, MD  [facility] Electrophysiologist:  None   Referring MD: [name], FNP   Chief Complaint: Hospital follow-up  History of Present Illness:    Cristina Wade is a 51 y.o. female with a hx of asthma, GERD, migraine, headaches who presents for hospital follow-up.   ETT 09/2019 was low risk without evidence of ischemia with rare isolated PVCs. Echo showed LVEF 50-55%, no WMA, mild MR. Cardiac CTA 12/2019 showed coronary calcium score of 0, with no evidence of CAD.   Last seen 03/2020 for chest pain and patient reported improved symptoms.   ED visit 10/11/21 for chest pain. Troponin was normal.   Today, the patient reports chest pain that started in the middle of June. Unsure if it was from anxiety. She was started on Lexapro and this has not helped. Chest pain is intermittent. No known triggers. It can last for hours. It's in the center of the chest. It is reproducible when you press on the left side of the chest. No shortness of breath, LLE, orthopnea, pnd.   Past Medical History:  Diagnosis Date   Asthma    Chronic pelvic pain in female 2018   Cystitis    Cystocele    Endometrial polyp    Endometriosis    per pt report but never seen during surg   Frequent headaches    Gastritis    GERD (gastroesophageal reflux disease)    rare   Gross hematuria    History of kidney stones    Microscopic hematuria    Migraine    Migraines    Neuropathy    Osteopenia    Urinary disorder    UTI (lower urinary tract infection)     Past Surgical History:  Procedure Laterality Date   COLONOSCOPY N/A 11/05/2014   Procedure: COLONOSCOPY;  Surgeon: Scot Jun, MD;  Location: [facility] ENDOSCOPY;  Service: Endoscopy;  Laterality: N/A;   COLONOSCOPY  11/2016   Dr. Mechele Collin   CYSTOSCOPY  2007   with biopsy     CYSTOSCOPY N/A 06/01/2016   Procedure: CYSTOSCOPY;  Surgeon: Vena Austria, MD;  Location: [facility] ORS;  Service: Gynecology;  Laterality: N/A;   DIAGNOSTIC LAPAROSCOPY     DILATATION & CURETTAGE/HYSTEROSCOPY WITH MYOSURE N/A 04/13/2015   Procedure: DILATATION & CURETTAGE/HYSTEROSCOPY WITH MYOSURE/POLYPECTOMY;  Surgeon: Elenora Fender Ward, MD;  Location: [facility] ORS;  Service: Gynecology;  Laterality: N/A;   DILATATION & CURETTAGE/HYSTEROSCOPY WITH MYOSURE N/A 06/01/2016   Procedure: DILATATION & CURETTAGE/HYSTEROSCOPY WITH MYOSURE;  Surgeon: Vena Austria, MD;  Location: [facility] ORS;  Service: Gynecology;  Laterality: N/A;   GUM SURGERY     laproscopy  2007   POLYPECTOMY     endometrial    Current Medications: Current Meds  Medication Sig   Azelastine HCl 137 MCG/SPRAY SOLN SMARTSIG:2 Spray(s) Both Nares Twice Daily PRN   Calcium Carbonate-Vit D-Min (CALCIUM 1200 PO) Take 2-3 tablets by mouth daily. Reported on 05/26/2015   escitalopram (LEXAPRO) 10 MG tablet Take 1 tablet (10 mg total) by mouth daily.   mometasone (NASONEX) 50 MCG/ACT nasal spray 2 sprays daily.   nitroGLYCERIN (NITROSTAT) 0.4 MG SL tablet Place 1 tablet (0.4 mg total) under the tongue every 5 (five) minutes as needed for up to 25 days for chest pain (Maximum of 3 doses.).   polyethylene  glycol (MIRALAX / GLYCOLAX) 17 g packet Take 17 g by mouth daily.   Povidone-Iodine 7.5 % SWAB Apply 1 applicator topically as needed.   psyllium (REGULOID) 0.52 g capsule Take 0.52 g by mouth daily.   Current Facility-Administered Medications for the 10/21/21 encounter (Office Visit) with Fransico Michael, Rodrigo Mcgranahan H, PA-C  Medication   lidocaine (XYLOCAINE) 2 % jelly 1 application     Allergies:   Lac bovis, Milk-related compounds, Omeprazole, and Penicillins   Social History   Socioeconomic History   Marital status: Single    Spouse name: Not on file   Number of children: Not on file   Years of education: Not on file   Highest education level: Not  on file  Occupational History   Not on file  Tobacco Use   Smoking status: Never   Smokeless tobacco: Never  Vaping Use   Vaping Use: Never used  Substance and Sexual Activity   Alcohol use: No   Drug use: No   Sexual activity: Not on file  Other Topics Concern   Not on file  Social History Narrative   Lives with twin sister   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    Social Determinants of Health   Financial Resource Strain: Not on file  Food Insecurity: Not on file  Transportation Needs: Not on file  Physical Activity: Not on file  Stress: Not on file  Social Connections: Not on file     Family History: The patient's family history includes Arthritis in her father and mother; Breast cancer (age of onset: 47) in her cousin; Cancer in her maternal grandmother and paternal grandfather; Cancer - Colon (age of onset: 5) in her father; Colon cancer (age of onset: 5) in her father; Diabetes in her mother; Heart disease in her mother; Heart failure in her mother; Hyperlipidemia in her father and mother; Hypertension in her mother; Kidney cancer (age of onset: 48) in her mother; Prostate cancer (age of onset: 90) in her father; Stroke in her mother; Transient ischemic attack in her father; Valvular heart disease in her father.  ROS:   Please see the history of present illness.     All other systems reviewed and are negative.  EKGs/Labs/Other Studies Reviewed:    The following studies were reviewed today:  ETT 09/2019  Study Highlights  Low risk exercise tolerance test without evidence of ischemia ([facility] treadmill score = +5). Rare isolated PVC's noted during recovery.  Echo 10/2019  1. Left ventricular ejection fraction, by estimation, is 50 to 55%. The  left ventricle has low normal function. The left ventricle has no  regional  wall motion abnormalities. Left ventricular diastolic parameters were  normal.   2. Right ventricular systolic function is normal. The right ventricular  size is normal. There is normal pulmonary artery systolic pressure.   3. The mitral valve is normal in structure. Mild mitral valve  regurgitation.   4. The inferior vena cava is dilated in size with <50% respiratory  variability, suggesting right atrial pressure of 15 mmHg.   Cardiac CTA 12/2019   IMPRESSION: 1. Coronary calcium score of 0. Patient is low risk for near term coronary events   2. Normal coronary origin with right dominance.   3. No evidence of CAD.   4.  CAD-RADS 0. Consider non-atherosclerotic causes of chest pain.   Electronically Signed: By: Debbe Odea M.D. On: 12/18/2019 15:51  EKG:  EKG is  ordered today.  The ekg ordered today demonstrates NSR 71bpm, TWI aVL, no changes  Recent Labs: 12/03/2020: Magnesium 2.2 04/15/2021: ALT 17; TSH 1.30 10/11/2021: BUN 13; Creatinine, Ser 0.76; Hemoglobin 13.5; Platelets 192; Potassium 3.7; Sodium 142  Recent Lipid Panel    Component Value Date/Time   CHOL 173 11/10/2020 0908   TRIG 73.0 11/10/2020 0908   HDL 60.20 11/10/2020 0908   CHOLHDL 3 11/10/2020 0908   VLDL 14.6 11/10/2020 0908   LDLCALC 98 11/10/2020 0908    Physical Exam:    VS:  BP 112/72 (BP Location: Left Arm, Patient Position: Sitting, Cuff Size: Normal)   Pulse 71   Ht 5\' 10"  (1.778 m)   Wt 127 lb 9.6 oz (57.9 kg)   SpO2 98%   BMI 18.31 kg/m     Wt Readings from Last 3 Encounters:  10/21/21 127 lb 9.6 oz (57.9 kg)  10/11/21 131 lb 13.4 oz (59.8 kg)  10/06/21 132 lb (59.9 kg)     GEN:  Well nourished, well developed in no acute distress HEENT: Normal NECK: No JVD; No carotid bruits LYMPHATICS: No lymphadenopathy CARDIAC: RRR, no murmurs, rubs, gallops, TTP RESPIRATORY:  Clear to auscultation without rales, wheezing or rhonchi  ABDOMEN: Soft, non-tender,  non-distended MUSCULOSKELETAL:  No edema; No deformity  SKIN: Warm and dry NEUROLOGIC:  Alert and oriented x 3 PSYCHIATRIC:  Normal affect   ASSESSMENT:    1. Chest pain, unspecified type    PLAN:    In order of problems listed above:  Chest pain Patient reports atypical chest pain for about 3 weeks. She is tender to palpation on the left side. She denies SOB. She had prior ETT, Echo and Cardiac CTA that were all unrevealing. Recent ER visit for chest pain with negative troponin/work-up. EKG today with no ischemic changes. Overall low suspicion for cardiac etiology. Plan to follow-up in a year.   Disposition: Follow up in 1 year(s) with MD/APP   Signed, Jonah Gingras 10/08/21, PA-C  10/21/2021 8:14 AM    [facility]

## 2021-10-21 NOTE — Telephone Encounter (Signed)
Appt scheduled for 10/25/21 with Dr French Ana

## 2021-10-21 NOTE — Telephone Encounter (Signed)
Scheduled appointment for 10/25/21

## 2021-10-25 ENCOUNTER — Ambulatory Visit: Payer: BC Managed Care – PPO | Admitting: Internal Medicine

## 2021-10-25 ENCOUNTER — Encounter: Payer: Self-pay | Admitting: Family

## 2021-10-25 ENCOUNTER — Encounter: Payer: Self-pay | Admitting: Emergency Medicine

## 2021-10-25 ENCOUNTER — Emergency Department: Payer: BC Managed Care – PPO

## 2021-10-25 ENCOUNTER — Emergency Department
Admission: EM | Admit: 2021-10-25 | Discharge: 2021-10-25 | Disposition: A | Discharge status: Home / Self Care | Payer: BC Managed Care – PPO | Attending: Emergency Medicine | Admitting: Emergency Medicine

## 2021-10-25 ENCOUNTER — Other Ambulatory Visit: Payer: Self-pay

## 2021-10-25 DIAGNOSIS — R519 Headache, unspecified: Secondary | ICD-10-CM | POA: Diagnosis not present

## 2021-10-25 DIAGNOSIS — M542 Cervicalgia: Secondary | ICD-10-CM | POA: Diagnosis not present

## 2021-10-25 DIAGNOSIS — R079 Chest pain, unspecified: Secondary | ICD-10-CM

## 2021-10-25 DIAGNOSIS — R0789 Other chest pain: Secondary | ICD-10-CM

## 2021-10-25 HISTORY — DX: Anxiety disorder, unspecified: F41.9

## 2021-10-25 LAB — BASIC METABOLIC PANEL
Anion gap: 10 (ref 5–15)
BUN: 13 mg/dL (ref 6–20)
CO2: 25 mmol/L (ref 22–32)
Calcium: 9.4 mg/dL (ref 8.9–10.3)
Chloride: 105 mmol/L (ref 98–111)
Creatinine, Ser: 0.71 mg/dL (ref 0.44–1.00)
GFR, Estimated: >60 mL/min (ref >60)
Glucose, Bld: 107 mg/dL — ABNORMAL HIGH (ref 70–99)
Potassium: 3.4 mmol/L — ABNORMAL LOW (ref 3.5–5.1)
Sodium: 140 mmol/L (ref 135–145)

## 2021-10-25 LAB — CBC
HCT: 43.2 % (ref 36.0–46.0)
Hemoglobin: 14 g/dL (ref 12.0–15.0)
MCH: 29.4 pg (ref 26.0–34.0)
MCHC: 32.4 g/dL (ref 30.0–36.0)
MCV: 90.6 fL (ref 80.0–100.0)
Platelets: 226 10*3/uL (ref 150–400)
RBC: 4.77 MIL/uL (ref 3.87–5.11)
RDW: 12.4 % (ref 11.5–15.5)
WBC: 6 10*3/uL (ref 4.0–10.5)
nRBC: 0 % (ref 0.0–0.2)

## 2021-10-25 LAB — TROPONIN I (HIGH SENSITIVITY): Troponin I (High Sensitivity): 4 ng/L (ref <18)

## 2021-10-25 MED ORDER — MELOXICAM 15 MG PO TABS: 15.0000 mg | ORAL_TABLET | Freq: Every day | ORAL | 0 refills | Status: DC

## 2021-10-25 MED ORDER — PREDNISONE 50 MG PO TABS: 50.0000 mg | ORAL_TABLET | Freq: Every day | ORAL | 0 refills | Status: DC

## 2021-10-25 NOTE — ED Triage Notes (Signed)
Patient to ED via POV for chest pain along with back pain, abd pain, and headache since the end of June. Pt was seen for same in June and followed up with cardiology. Cards states symptoms were not heart related.

## 2021-10-25 NOTE — ED Provider Notes (Signed)
[facility] Provider Note  Patient Contact: 5:13 PM (approximate)   History   Chest Pain   HPI  Cristina Wade is a 51 y.o. female who presents the emergency department complaining of intermittent left-sided chest pain, neck pain and headache and occasional nausea.  Patient has had the symptoms for several weeks.  She has been seen once in the emergency department as well as by her cardiologist.  She had reassuring work-up in the ED on 10/11/2021.  Patient had reassuring work-up to include x-ray, EKG and troponin.  She followed up with cardiology who does not feel that this chest pain is cardiac related.  Of note patient did start Lexapro prior to the onset of her symptoms.  She does feel like she has been more anxious after starting the medication.  Patient has no cardiac history.  It is intermittent, does not seem to be tied to exercise, or other factors such as caffeine intake.  Patient does state that when the pain is there it is reproducible with palpation along the left chest wall.     Physical Exam   Triage Vital Signs: ED Triage Vitals  Enc Vitals Group     BP 10/25/21 1427 113/75     Pulse Rate 10/25/21 1427 65     Resp 10/25/21 1427 17     Temp 10/25/21 1427 98.9 F (37.2 C)     Temp Source 10/25/21 1427 Oral     SpO2 10/25/21 1427 97 %     Weight 10/25/21 1537 127 lb 6.8 oz (57.8 kg)     Height 10/25/21 1537 5\' 10"  (1.778 m)     Head Circumference --      Peak Flow --      Pain Score 10/25/21 1431 4     Pain Loc --      Pain Edu? --      Excl. in GC? --     Most recent vital signs: Vitals:   10/25/21 1427  BP: 113/75  Pulse: 65  Resp: 17  Temp: 98.9 F (37.2 C)  SpO2: 97%     General: Alert and in no acute distress. Eyes:  PERRL. EOMI. Head: No acute traumatic findings  Neck: No stridor. No cervical spine tenderness to palpation.  Cardiovascular:  Good peripheral perfusion.  Normal S1, S2.  No appreciable murmurs, rubs,  gallops. Respiratory: Normal respiratory effort without tachypnea or retractions. Lungs CTAB. Good air entry to the bases with no decreased or absent breath sounds. Gastrointestinal: Bowel sounds 4 quadrants. Soft and nontender to palpation. No guarding or rigidity. No palpable masses. No distention. No CVA tenderness. Musculoskeletal: Full range of motion to all extremities.  Neurologic:  No gross focal neurologic deficits are appreciated.  Skin:   No rash noted Other:   ED Results / Procedures / Treatments   Labs (all labs ordered are listed, but only abnormal results are displayed) Labs Reviewed  BASIC METABOLIC PANEL - Abnormal; Notable for the following components:      Result Value   Potassium 3.4 (*)    Glucose, Bld 107 (*)    All other components within normal limits  CBC  POC URINE PREG, ED  TROPONIN I (HIGH SENSITIVITY)  TROPONIN I (HIGH SENSITIVITY)     EKG  ED ECG REPORT I, 12/26/21 Matthewjames Petrasek,  personally viewed and interpreted this ECG.   Date: 10/25/2021  EKG Time: 1427 hrs.  Rate: 64 bpm  Rhythm: unchanged from previous tracings, normal sinus  rhythm, no significant change on EKG from 10/21/2021  Axis: Rightward  Intervals:none  ST&T Change: No ST elevation or depression noted  Normal sinus rhythm.  No STEMI.  No significant change from 10/21/2021    RADIOLOGY  I personally viewed, evaluated, and interpreted these images as part of my medical decision making, as well as reviewing the written report by the radiologist.  ED Provider Interpretation: No appreciable cardiopulmonary findings on chest x-ray.  DG Chest 2 View  Result Date: 10/25/2021 CLINICAL DATA:  Chest pain. EXAM: CHEST - 2 VIEW COMPARISON:  Chest two views 10/11/2021 FINDINGS: Cardiac silhouette and mediastinal contours are within normal limits. The lungs are clear. No pleural effusion or pneumothorax. Mild disc space narrowing of the superior thoracic spine is similar to prior. Minimal  levocurvature centered at T1-2 is unchanged. IMPRESSION: No active cardiopulmonary disease. Electronically Signed   By: Yvonne Kendall M.D.   On: 10/25/2021 15:12    PROCEDURES:  Critical Care performed: No  Procedures   MEDICATIONS ORDERED IN ED: Medications - No data to display   IMPRESSION / MDM / [place] / ED COURSE  I reviewed the triage vital signs and the nursing notes.                              Differential diagnosis includes, but is not limited to, STEMI, NSTEMI, costochondritis, anxiety, arrhythmia, pneumonia, bronchitis, viral illness  Patient's presentation is most consistent with acute presentation with potential threat to life or bodily function.   Patient's diagnosis is consistent with nonspecific chest pain, costochondritis.  Patient presents to the emergency department consistent complaining of ongoing left-sided chest pain.  This is intermittent in nature.  She will have intermittent neck pain, posterior headache as well as with her chest pain.  No pain currently.  No shortness of breath.  Patient's work-up is again reassuring with reassuring x-ray, EKG, labs including troponin.  At this time do not suspect cardiac source.  With reproducible pain along the chest wall suspect a component of costochondritis.  We will trial the patient on an anti-inflammatory.  Patient is also having increased anxiety after starting anxiolytic medication.  She is 2 weeks into this medication I do recommend giving this a month of trial but if she is having ongoing anxiety however talk to the prescriber about either changing medications or coming off of this medication.  Patient is agreeable with this plan.  Concerning signs and symptoms are discussed to return to the ED for.  Otherwise she may follow-up with her cardiologist or primary care provider..  Patient is given ED precautions to return to the ED for any worsening or new symptoms.        FINAL CLINICAL IMPRESSION(S) /  ED DIAGNOSES   Final diagnoses:  Nonspecific chest pain  Chest wall pain     Rx / DC Orders   ED Discharge Orders          Ordered    predniSONE (DELTASONE) 50 MG tablet  Daily with breakfast        10/25/21 1720    meloxicam (MOBIC) 15 MG tablet  Daily        10/25/21 1720             Note:  This document was prepared using Dragon voice recognition software and may include unintentional dictation errors.   Darletta Moll, PA-C 10/25/21 1720    Lucrezia Starch,  MD 10/25/21 2109

## 2021-10-26 ENCOUNTER — Encounter: Payer: Self-pay | Admitting: Family

## 2021-10-26 ENCOUNTER — Ambulatory Visit: Payer: BC Managed Care – PPO | Admitting: Medical

## 2021-10-27 NOTE — Telephone Encounter (Signed)
Spoke to patient and she already has appt scheduled to discuss anxiety on 11/04/21.

## 2021-10-28 ENCOUNTER — Ambulatory Visit: Payer: BC Managed Care – PPO | Admitting: Physician Assistant

## 2021-10-28 ENCOUNTER — Encounter: Payer: Self-pay | Admitting: Family

## 2021-11-01 NOTE — Progress Notes (Unsigned)
11/02/21 4:56 PM   [name] [birth year] [id number]  Referring provider:  [name], FNP [address] [place],  [place] [address]  Urological history  1. High risk hematuria -non-smoker -work up 2015 -bilateral punctate stones -cysto 2019 NED -non-contrast CT 2022 - bilateral punctate stones -cysto, 2022 - NED -reports of gross heme associated with UTI 10/21/2021 -CATH UA negative for micro heme    2. Neurogenic bladder -UDS 2017 indicating nonobstructive urinary retention with poor bladder sensation and external sphincter dyssynergia -completed 12 weeks of PTNS -completed pelvic floor PT -manages with self cathing three times daily   3. Nephrolithiasis -non-contrast CT 2022 2 mm stone in the right kidney lower pole without hydronephrosis.  Three tiny stones in left kidney lower pole region without hydronephrosis   4. rUTI's -contributing factors of age, incomplete emptying, vaginal atrophy, chronic constipation,  -documented positive urine cultures over the last year             09/18/2020 proteus mirabilis             10/21/2020 citrobacter freundii             12/07/2020 staph epidermis/B-hemolytic strep             12/24/2020 Klebsiella pneumoniae             12/30/2020 Klebsiella pneumoniae/B-hemolytic strep             01/20/2021 multiple species             04/15/2021 streptococcus agalactiae              04/26/2021 Citrobacter freundii  - UA benign    HPI: Cristina Wade is a 51 y.o.female who presents today for further evaluation of UTI after being seen in urgent care.   She was seen on October 21, 2021 for gross hematuria and nausea.  Urine culture was positive for Enterococcus and she was treated with culture appropriate antibiotics.  She states that she had 1 episode of gross hematuria.  CATH UA benign.  Her concern today as she feels that her output is not matching her input as far as fluids are concerned.  She is also having bilateral back  pain in the lumbar area left greater than right and also lower abdominal pain.  She states that she feels nauseous and she has been feeling hot and having chills.  She is self cathing 3 times daily and will have self voids in between catheter times.  Patient denies any modifying or aggravating factors.  Patient denies any gross hematuria or dysuria.  Patient denies any vomiting.     PMH: Past Medical History:  Diagnosis Date   Anxiety    Asthma    Chronic pelvic pain in female 2018   Cystitis    Cystocele    Endometrial polyp    Endometriosis    per pt report but never seen during surg   Frequent headaches    Gastritis    GERD (gastroesophageal reflux disease)    rare   Gross hematuria    History of kidney stones    Microscopic hematuria    Migraine    Migraines    Neuropathy    Osteopenia    Urinary disorder    UTI (lower urinary tract infection)     Surgical History: Past Surgical History:  Procedure Laterality Date   COLONOSCOPY N/A 11/05/2014   Procedure: COLONOSCOPY;  Surgeon: Scot Jun, MD;  Location: [facility];  Service: Endoscopy;  Laterality: N/A;   COLONOSCOPY  11/2016   Dr. Vira Agar   CYSTOSCOPY  2007   with biopsy    CYSTOSCOPY N/A 06/01/2016   Procedure: CYSTOSCOPY;  Surgeon: Malachy Mood, MD;  Location: [facility] ORS;  Service: Gynecology;  Laterality: N/A;   DIAGNOSTIC LAPAROSCOPY     DILATATION & CURETTAGE/HYSTEROSCOPY WITH MYOSURE N/A 04/13/2015   Procedure: DILATATION & CURETTAGE/HYSTEROSCOPY WITH MYOSURE/POLYPECTOMY;  Surgeon: Honor Loh Ward, MD;  Location: [facility] ORS;  Service: Gynecology;  Laterality: N/A;   DILATATION & CURETTAGE/HYSTEROSCOPY WITH MYOSURE N/A 06/01/2016   Procedure: DILATATION & CURETTAGE/HYSTEROSCOPY WITH MYOSURE;  Surgeon: Malachy Mood, MD;  Location: [facility] ORS;  Service: Gynecology;  Laterality: N/A;   GUM SURGERY     laproscopy  2007   POLYPECTOMY     endometrial    Home Medications:  Allergies as of 11/02/2021        Reactions   Milk (cow) Other (See Comments)   Milk-related Compounds    Omeprazole Other (See Comments)   Pt reports "Chest pain, HA and Dysuria."   Penicillins Itching, Rash        Medication List        Accurate as of November 02, 2021  4:56 PM. If you have any questions, ask your nurse or doctor.          Azelastine HCl 137 MCG/SPRAY Soln SMARTSIG:2 Spray(s) Both Nares Twice Daily PRN   CALCIUM 1200 PO Take 2-3 tablets by mouth daily. Reported on 05/26/2015   escitalopram 10 MG tablet Commonly known as: LEXAPRO Take 1 tablet (10 mg total) by mouth daily.   meloxicam 15 MG tablet Commonly known as: MOBIC Take 1 tablet (15 mg total) by mouth daily.   mometasone 50 MCG/ACT nasal spray Commonly known as: NASONEX 2 sprays daily.   nitroGLYCERIN 0.4 MG SL tablet Commonly known as: NITROSTAT Place 1 tablet (0.4 mg total) under the tongue every 5 (five) minutes as needed for up to 25 days for chest pain (Maximum of 3 doses.).   polyethylene glycol 17 g packet Commonly known as: MIRALAX / GLYCOLAX Take 17 g by mouth daily.   Povidone-Iodine 7.5 % Swab Apply 1 applicator topically as needed.   predniSONE 50 MG tablet Commonly known as: DELTASONE Take 1 tablet (50 mg total) by mouth daily with breakfast.   pregabalin 100 MG capsule Commonly known as: LYRICA Take 100 mg by mouth 2 (two) times daily.   psyllium 0.52 g capsule Commonly known as: REGULOID Take 0.52 g by mouth daily.        Allergies:  Allergies  Allergen Reactions   Milk (Cow) Other (See Comments)   Milk-Related Compounds    Omeprazole Other (See Comments)    Pt reports "Chest pain, HA and Dysuria."   Penicillins Itching and Rash    Family History: Family History  Problem Relation Age of Onset   Colon cancer Father 31   Arthritis Father    Hyperlipidemia Father    Transient ischemic attack Father    Cancer - Colon Father 10   Prostate cancer Father 33   Valvular heart disease  Father    Arthritis Mother    Heart disease Mother    Stroke Mother        TIA   Hypertension Mother    Diabetes Mother    Kidney cancer Mother 30   Heart failure Mother    Hyperlipidemia Mother    Cancer Maternal Grandmother    Cancer Paternal Grandfather  prostate   Breast cancer Cousin 35       has contact    Social History:  reports that she has never smoked. She has never used smokeless tobacco. She reports that she does not drink alcohol and does not use drugs.   Physical Exam: BP 101/62   Pulse 65   Ht 5\' 10"  (1.778 m)   Wt 123 lb (55.8 kg)   BMI 17.65 kg/m   Constitutional:  Well nourished. Alert and oriented, No acute distress. HEENT: [place] AT, moist mucus membranes.  Trachea midline Cardiovascular: No clubbing, cyanosis, or edema. Respiratory: Normal respiratory effort, no increased work of breathing. Neurologic: Grossly intact, no focal deficits, moving all 4 extremities. Psychiatric: Normal mood and affect.    Laboratory Data:    Latest Ref Rng & Units 10/25/2021    2:28 PM 10/11/2021    3:37 PM 04/15/2021   10:16 AM  CBC  WBC 4.0 - 10.5 K/uL 6.0  5.9  3.7   Hemoglobin 12.0 - 15.0 g/dL 04/17/2021  44.8  18.5   Hematocrit 36.0 - 46.0 % 43.2  40.7  41.9   Platelets 150 - 400 K/uL 226  192  191.0        Latest Ref Rng & Units 10/25/2021    2:28 PM 10/11/2021    3:37 PM 04/15/2021   10:16 AM  BMP  Glucose 70 - 99 mg/dL 04/17/2021  497  64   BUN 6 - 20 mg/dL 13  13  8    Creatinine 0.44 - 1.00 mg/dL 026   3.78   Sodium 135 - 145 mmol/L 140  142  142   Potassium 3.5 - 5.1 mmol/L 3.4  3.7  4.1   Chloride 98 - 111 mmol/L 105  109  104   CO2 22 - 32 mmol/L 25  25  32   Calcium 8.9 - 10.3 mg/dL 9.4  9.7  9.9     Urinalysis Results for orders placed or performed in visit on 11/02/21  Microscopic Examination   Urine  Result Value Ref Range   WBC, UA 0-5 0 - 5 /hpf   RBC, Urine 0-2 0 - 2 /hpf   Epithelial Cells (non renal) 0-10 0 - 10 /hpf   Bacteria, UA  None seen None seen/Few  Urinalysis, Complete  Result Value Ref Range   Specific Gravity, UA 1.015 1.005 - 1.030   pH, UA 7.0 5.0 - 7.5   Color, UA Yellow Yellow   Appearance Ur Clear Clear   Leukocytes,UA Negative Negative   Protein,UA Negative Negative/Trace   Glucose, UA Negative Negative   Ketones, UA Trace (A) Negative   RBC, UA Negative Negative   Bilirubin, UA Negative Negative   Urobilinogen, Ur 0.2 0.2 - 1.0 mg/dL   Nitrite, UA Negative Negative   Microscopic Examination See below:     I have reviewed the labs.   Pertinent Imaging: N/A  Assessment & Plan:    1. Gross hematuria -Associated with UTI -resolved  2. Low urinary output -She is quite concerned about her lack of urinary output and is concerned that something may be seriously wrong -I explained that the body ridge itself of fluids and different manner such as through breathing, bowel movements, sweating and urinating etc. -Advised patient for the next week to log her fluid intake and to log her out take -RUS pending as patient is concerned and overly anxious that she may have a stone or something is wrong with her  kidneys and we have not had any recent upper tract imaging  Return in about 1 week (around 11/09/2021) for review log and RUS report .  Zara Council, PA-C   [facility] [address], [place], [place] [address] [phone]  I spent 30 minutes on the day of the encounter to include pre-visit record review, face-to-face time with the patient, and post-visit ordering of tests.

## 2021-11-02 ENCOUNTER — Encounter: Payer: Self-pay | Admitting: Urology

## 2021-11-02 ENCOUNTER — Ambulatory Visit: Payer: BC Managed Care – PPO | Admitting: Urology

## 2021-11-02 VITALS — BP 101/62 | HR 65 | Ht 70.0 in | Wt 123.0 lb

## 2021-11-02 DIAGNOSIS — R34 Anuria and oliguria: Secondary | ICD-10-CM

## 2021-11-02 DIAGNOSIS — R310 Gross hematuria: Secondary | ICD-10-CM | POA: Diagnosis not present

## 2021-11-02 LAB — MICROSCOPIC EXAMINATION: Bacteria, UA: NONE SEEN

## 2021-11-02 LAB — URINALYSIS, COMPLETE
Bilirubin, UA: NEGATIVE
Glucose, UA: NEGATIVE
Leukocytes,UA: NEGATIVE
Nitrite, UA: NEGATIVE
Protein,UA: NEGATIVE
RBC, UA: NEGATIVE
Specific Gravity, UA: 1.015 (ref 1.005–1.030)
Urobilinogen, Ur: 0.2 mg/dL (ref 0.2–1.0)
pH, UA: 7 (ref 5.0–7.5)

## 2021-11-03 NOTE — Progress Notes (Signed)
In and Out Catheterization  Patient is present today for a I & O catheterization due to dysuria. Patient was cleaned and prepped in a sterile fashion with betadine . A 14FR cath was inserted no complications were noted , 76ml of urine return was noted, urine was yellow in color. A clean urine sample was collected for UA,UCX. Bladder was drained  And catheter was removed with out difficulty.    Performed by: Teressa Lower, CMA

## 2021-11-04 ENCOUNTER — Encounter: Payer: Self-pay | Admitting: Family

## 2021-11-04 ENCOUNTER — Ambulatory Visit: Payer: BC Managed Care – PPO | Admitting: Family

## 2021-11-04 VITALS — BP 118/64 | HR 71 | Temp 97.7°F | Ht 70.0 in | Wt 130.6 lb

## 2021-11-04 DIAGNOSIS — G8929 Other chronic pain: Secondary | ICD-10-CM

## 2021-11-04 DIAGNOSIS — F419 Anxiety disorder, unspecified: Secondary | ICD-10-CM

## 2021-11-04 DIAGNOSIS — R519 Headache, unspecified: Secondary | ICD-10-CM | POA: Diagnosis not present

## 2021-11-04 MED ORDER — VENLAFAXINE HCL ER 37.5 MG PO CP24: 37.5000 mg | ORAL_CAPSULE | Freq: Every day | ORAL | 1 refills | Status: DC

## 2021-11-04 NOTE — Assessment & Plan Note (Addendum)
Reviewed cardiology notes. Patient CP is not exertional and has improved after ED course of prednisone, mobic. Will monitor closely as discussed if component of anxiety. Unable to reproduce CP today. Wean off of lexapro and start effexor 37.5mg .  Close follow up in 2 weeks.

## 2021-11-04 NOTE — Assessment & Plan Note (Addendum)
Persistent HA. Discussed prophylactic treatment with effexor. Discussed whether lexapro may have contributed and advised patient once she is off of lexapro, she can monitor closely to see if HA were to improve .Start effexor with close follow up. Consider propranolol and/or nortriptyline if effexor not effective.

## 2021-11-04 NOTE — Patient Instructions (Addendum)
Wean off of lexapro 10mg  Take lexapro 10mg  every other day for 3 days , then stop  Start effexor for headache and anxiety once off of the lexapro.   Please keep headache diary  General Headache Without Cause A headache is pain or discomfort felt around the head or neck area. There are many causes and types of headaches. A few common types include: Tension headaches. Migraine headaches. Cluster headaches. Chronic daily headaches. Sometimes, the specific cause of a headache may not be found. Follow these instructions at home: Watch your condition for any changes. Let your health care provider know about them. Take these steps to help with your condition: Managing pain     Take over-the-counter and prescription medicines only as told by your health care provider. Treatment may include medicines for pain that are taken by mouth or applied to the skin. Lie down in a dark, quiet room when you have a headache. Keep lights dim if bright lights bother you or make your headaches worse. If directed, put ice on your head and neck area: Put ice in a plastic bag. Place a towel between your skin and the bag. Leave the ice on for 20 minutes, 2-3 times per day. Remove the ice if your skin turns bright red. This is very important. If you cannot feel pain, heat, or cold, you have a greater risk of damage to the area. If directed, apply heat to the affected area. Use the heat source that your health care provider recommends, such as a moist heat pack or a heating pad. Place a towel between your skin and the heat source. Leave the heat on for 20-30 minutes. Remove the heat if your skin turns bright red. This is especially important if you are unable to feel pain, heat, or cold. You have a greater risk of getting burned. Eating and drinking Eat meals on a regular schedule. If you drink alcohol: Limit how much you have to: 0-1 drink a day for women who are not pregnant. 0-2 drinks a day for men. Know  how much alcohol is in a drink. In the [place] one drink equals one 12 oz bottle of beer (355 mL), one 5 oz glass of wine (148 mL), or one 1 oz glass of hard liquor (44 mL). Stop drinking caffeine, or decrease the amount of caffeine you drink. Drink enough fluid to keep your urine pale yellow. General instructions  Keep a headache journal to help find out what may trigger your headaches. For example, write down: What you eat and drink. How much sleep you get. Any change to your diet or medicines. Try massage or other relaxation techniques. Limit stress. Sit up straight, and do not tense your muscles. Do not use any products that contain nicotine or tobacco. These products include cigarettes, chewing tobacco, and vaping devices, such as e-cigarettes. If you need help quitting, ask your health care provider. Exercise regularly as told by your health care provider. Sleep on a regular schedule. Get 7-9 hours of sleep each night, or the amount recommended by your health care provider. Keep all follow-up visits. This is important. Contact a health care provider if: Medicine does not help your symptoms. You have a headache that is different from your usual headache. You have nausea or you vomit. You have a fever. Get help right away if: Your headache: Becomes severe quickly. Gets worse after moderate to intense physical activity. You have any of these symptoms: Repeated vomiting. Pain or stiffness in your neck.  Changes to your vision. Pain in an eye or ear. Problems with speech. Muscular weakness or loss of muscle control. Loss of balance or coordination. You feel faint or pass out. You have confusion. You have a seizure. These symptoms may represent a serious problem that is an emergency. Do not wait to see if the symptoms will go away. Get medical help right away. Call your local emergency services (911 in the [place] Do not drive yourself to the hospital. Summary A headache is pain or  discomfort felt around the head or neck area. There are many causes and types of headaches. In some cases, the cause may not be found. Keep a headache journal to help find out what may trigger your headaches. Watch your condition for any changes. Let your health care provider know about them. Contact a health care provider if you have a headache that is different from the usual headache, or if your symptoms are not helped by medicine. Get help right away if your headache becomes severe, you vomit, you have a loss of vision, you lose your balance, or you have a seizure. This information is not intended to replace advice given to you by your health care provider. Make sure you discuss any questions you have with your health care provider. Document Revised: 09/01/2020 Document Reviewed: 09/01/2020 Elsevier Patient Education  2023 [employer].

## 2021-11-04 NOTE — Progress Notes (Signed)
Subjective:    Patient ID: Cristina Wade, female    DOB: 11-Dec-1970, 51 y.o.   MRN: [id number]  CC: Cristina Wade is a 51 y.o. female who presents today for follow up.   HPI: She complains of nervousness. She doesn't know if anxiety.   She is concerned that she felt more anxiety while on lexapro and that lexapro has increased HA.  Currently taking lexapro 10mg .  She had previously started due to nervousness.   She complains of HA which has been intermittent x 4 weeks. Have been daily.onset around the time of when she started lexapro. HA is frontal and dull ache. Sometimes associated with nausea.   No sinus congestion, photophobia, vision change.She has HA today. HA can hurt posteriorly. She has posterior neck pain.   HA doesn't awaken at night. HA is worse with valsava.  She has tried tylenol with temporary relief.  No caffeine use.  Sleeps well.   She continues to have random central CP which has improved. CP has improved with mobic 15mg . CP is not exertional.     She felt more anxiety while on lexapro . Currently taking lexapro 10mg .  She had previously started due to nervousness. She feels nervousness.   She complains of HA which has been intermittent x 4 weeks. Have been daily.onset around the time of when she started lexapro. HA is frontal and dull ache. Sometimes  HAassociated with nausea.   No sinus congestion, photophobia, vision change.She had HA today. HA can hurt posteriorly. She has posterior neck pain.  HA doesn't awaken at night. HA is not worse with valsava.  She has tried tylenol with temporary relief.  No caffeine use.  Sleeps well.   H/o migraine.   MRI brain normal 06/09/21  She has seen Dr in past.  MR thoracic and cervical spine showed marginal changes of the cervical spine and minimal degenerative changes of thoracic spine.    ED 10/25/21 for CP CXR 10/25/21 without acute findings She completed prednisone and mobic.    Seen 10/21/21 by 12/26/21,  cardiology who felt overall low suspicion for cardiac etiology CTA 12/2019 with coronary calcium score 0.  HISTORY:  Past Medical History:  Diagnosis Date   Anxiety    Asthma    Chronic pelvic pain in female 2018   Cystitis    Cystocele    Endometrial polyp    Endometriosis    per pt report but never seen during surg   Frequent headaches    Gastritis    GERD (gastroesophageal reflux disease)    rare   Gross hematuria    History of kidney stones    Microscopic hematuria    Migraine    Migraines    Neuropathy    Osteopenia    Urinary disorder    UTI (lower urinary tract infection)    Past Surgical History:  Procedure Laterality Date   COLONOSCOPY N/A 11/05/2014   Procedure: COLONOSCOPY;  Surgeon: 01/2020, MD;  Location: [facility] ENDOSCOPY;  Service: Endoscopy;  Laterality: N/A;   COLONOSCOPY  11/2016   Dr. Scot Jun   CYSTOSCOPY  2007   with biopsy    CYSTOSCOPY N/A 06/01/2016   Procedure: CYSTOSCOPY;  Surgeon: Mechele Collin, MD;  Location: [facility] ORS;  Service: Gynecology;  Laterality: N/A;   DIAGNOSTIC LAPAROSCOPY     DILATATION & CURETTAGE/HYSTEROSCOPY WITH MYOSURE N/A 04/13/2015   Procedure: DILATATION & CURETTAGE/HYSTEROSCOPY WITH MYOSURE/POLYPECTOMY;  Surgeon: 06/03/2016 Ward, MD;  Location: [facility] ORS;  Service: Gynecology;  Laterality: N/A;   DILATATION & CURETTAGE/HYSTEROSCOPY WITH MYOSURE N/A 06/01/2016   Procedure: DILATATION & CURETTAGE/HYSTEROSCOPY WITH MYOSURE;  Surgeon: Malachy Mood, MD;  Location: [facility] ORS;  Service: Gynecology;  Laterality: N/A;   GUM SURGERY     laproscopy  2007   POLYPECTOMY     endometrial   Family History  Problem Relation Age of Onset   Colon cancer Father 76   Arthritis Father    Hyperlipidemia Father    Transient ischemic attack Father    Cancer - Colon Father 24   Prostate cancer Father 31   Valvular heart disease Father    Arthritis Mother    Heart disease Mother    Stroke Mother        TIA   Hypertension Mother     Diabetes Mother    Kidney cancer Mother 23   Heart failure Mother    Hyperlipidemia Mother    Cancer Maternal Grandmother    Cancer Paternal Grandfather        prostate   Breast cancer Cousin 67       has contact    Allergies: Milk (cow), Milk-related compounds, Omeprazole, and Penicillins Current Outpatient Medications on File Prior to Visit  Medication Sig Dispense Refill   Azelastine HCl 137 MCG/SPRAY SOLN SMARTSIG:2 Spray(s) Both Nares Twice Daily PRN     Calcium Carbonate-Vit D-Min (CALCIUM 1200 PO) Take 2-3 tablets by mouth daily. Reported on 05/26/2015     meloxicam (MOBIC) 15 MG tablet Take 1 tablet (15 mg total) by mouth daily. 30 tablet 0   mometasone (NASONEX) 50 MCG/ACT nasal spray 2 sprays daily.     nitroGLYCERIN (NITROSTAT) 0.4 MG SL tablet Place 1 tablet (0.4 mg total) under the tongue every 5 (five) minutes as needed for up to 25 days for chest pain (Maximum of 3 doses.). 25 tablet 0   polyethylene glycol (MIRALAX / GLYCOLAX) 17 g packet Take 17 g by mouth daily.     Povidone-Iodine 7.5 % SWAB Apply 1 applicator topically as needed. 50 each 3   predniSONE (DELTASONE) 50 MG tablet Take 1 tablet (50 mg total) by mouth daily with breakfast. 5 tablet 0   pregabalin (LYRICA) 100 MG capsule Take 100 mg by mouth 2 (two) times daily.     psyllium (REGULOID) 0.52 g capsule Take 0.52 g by mouth daily.     Current Facility-Administered Medications on File Prior to Visit  Medication Dose Route Frequency Provider Last Rate Last Admin   lidocaine (XYLOCAINE) 2 % jelly 1 application  1 application  Urethral Once Billey Co, MD        Social History   Tobacco Use   Smoking status: Never   Smokeless tobacco: Never  Vaping Use   Vaping Use: Never used  Substance Use Topics   Alcohol use: No   Drug use: No    Review of Systems  Constitutional:  Negative for chills and fever.  Respiratory:  Negative for cough.   Cardiovascular:  Positive for chest pain. Negative for  palpitations.  Gastrointestinal:  Negative for nausea and vomiting.  Neurological:  Positive for headaches.  Psychiatric/Behavioral:  Negative for sleep disturbance. The patient is nervous/anxious.       Objective:    BP 118/64 (BP Location: Left Arm, Patient Position: Sitting, Cuff Size: Normal)   Pulse 71   Temp 97.7 F (36.5 C) (Oral)   Ht 5\' 10"  (1.778 m)   Wt 130 lb 9.6 oz (59.2  kg)   LMP 10/10/2021 (Exact Date)   SpO2 98%   BMI 18.74 kg/m  BP Readings from Last 3 Encounters:  11/04/21 118/64  11/02/21 101/62  10/25/21 113/75   Wt Readings from Last 3 Encounters:  11/04/21 130 lb 9.6 oz (59.2 kg)  11/02/21 123 lb (55.8 kg)  10/25/21 127 lb 6.8 oz (57.8 kg)    Physical Exam Vitals reviewed.  Constitutional:      Appearance: She is well-developed.  HENT:     Mouth/Throat:     Pharynx: Uvula midline.  Eyes:     Conjunctiva/sclera: Conjunctivae normal.     Pupils: Pupils are equal, round, and reactive to light.     Comments: Fundus normal bilaterally.   Cardiovascular:     Rate and Rhythm: Normal rate and regular rhythm.     Pulses: Normal pulses.     Heart sounds: Normal heart sounds.  Pulmonary:     Effort: Pulmonary effort is normal.     Breath sounds: Normal breath sounds. No wheezing, rhonchi or rales.  Skin:    General: Skin is warm and dry.  Neurological:     Mental Status: She is alert.     Cranial Nerves: No cranial nerve deficit.     Sensory: No sensory deficit.     Deep Tendon Reflexes:     Reflex Scores:      Bicep reflexes are 2+ on the right side and 2+ on the left side.      Patellar reflexes are 2+ on the right side and 2+ on the left side.    Comments: Grip equal and strong bilateral upper extremities. Gait strong and steady. Able to perform rapid alternating movement without difficulty.   Psychiatric:        Speech: Speech normal.        Behavior: Behavior normal.        Thought Content: Thought content normal.        Assessment &  Plan:   Problem List Items Addressed This Visit       Other   Anxiety    Reviewed cardiology notes. Patient CP is not exertional and has improved after ED course of prednisone, mobic. Will monitor closely as discussed if component of anxiety. Unable to reproduce CP today. Wean off of lexapro and start effexor 37.5mg .  Close follow up in 2 weeks.       Relevant Medications   venlafaxine XR (EFFEXOR XR) 37.5 MG 24 hr capsule   Headache - Primary    Persistent HA. Discussed prophylactic treatment with effexor. Discussed whether lexapro may have contributed and advised patient once she is off of lexapro, she can monitor closely to see if HA were to improve .Start effexor with close follow up. Consider propranolol and/or nortriptyline if effexor not effective.       Relevant Medications   venlafaxine XR (EFFEXOR XR) 37.5 MG 24 hr capsule     I have discontinued Jasmine December Burruss's escitalopram. I am also having her start on venlafaxine XR. Additionally, I am having her maintain her Calcium Carbonate-Vit D-Min (CALCIUM 1200 PO), Azelastine HCl, pregabalin, psyllium, polyethylene glycol, mometasone, Povidone-Iodine, nitroGLYCERIN, predniSONE, and meloxicam. We will continue to administer lidocaine.   Meds ordered this encounter  Medications   venlafaxine XR (EFFEXOR XR) 37.5 MG 24 hr capsule    Sig: Take 1 capsule (37.5 mg total) by mouth daily with breakfast.    Dispense:  90 capsule    Refill:  1  Order Specific Question:   Supervising Provider    Answer:   Sherlene Shams [[id number]]    Return precautions given.   Risks, benefits, and alternatives of the medications and treatment plan prescribed today were discussed, and patient expressed understanding.   Education regarding symptom management and diagnosis given to patient on AVS.  Continue to follow with [name], FNP for routine health maintenance.   [name] and I agreed with plan.   Rennie Plowman, FNP

## 2021-11-06 LAB — CULTURE, URINE COMPREHENSIVE

## 2021-11-07 ENCOUNTER — Telehealth: Payer: Self-pay

## 2021-11-07 NOTE — Telephone Encounter (Signed)
-----   Message from Harle Battiest, PA-C sent at 11/07/2021  8:22 AM EDT ----- Please let Cristina Wade know that her urine culture was negative.

## 2021-11-07 NOTE — Telephone Encounter (Signed)
Pt notified via mychart

## 2021-11-08 ENCOUNTER — Ambulatory Visit
Admission: RE | Admit: 2021-11-08 | Discharge: 2021-11-08 | Disposition: A | Discharge status: Home / Self Care | Payer: BC Managed Care – PPO | Source: Ambulatory Visit | Attending: Urology | Admitting: Urology

## 2021-11-08 DIAGNOSIS — R310 Gross hematuria: Secondary | ICD-10-CM | POA: Diagnosis present

## 2021-11-09 ENCOUNTER — Encounter: Payer: Self-pay | Admitting: Urology

## 2021-11-09 ENCOUNTER — Ambulatory Visit: Payer: BC Managed Care – PPO | Admitting: Urology

## 2021-11-09 VITALS — BP 100/60 | HR 65 | Ht 67.0 in | Wt 130.0 lb

## 2021-11-09 DIAGNOSIS — N319 Neuromuscular dysfunction of bladder, unspecified: Secondary | ICD-10-CM | POA: Diagnosis not present

## 2021-11-09 NOTE — Progress Notes (Signed)
11/09/21 10:40 PM   Cristina Wade May 22, 1970 [id number]  Referring provider:  [name], FNP [address] [place],  [place] [address]  Chief Complaint  Patient presents with   Hematuria   Follow-up    Urological history  1. High risk hematuria -non-smoker -work up 2015 -bilateral punctate stones -cysto 2019 NED -non-contrast CT 2022 - bilateral punctate stones -cysto, 2022 - NED -reports of gross heme associated with UTI 10/21/2021 -RUS, 10/2021 - NED   2. Neurogenic bladder -UDS 2017 indicating nonobstructive urinary retention with poor bladder sensation and external sphincter dyssynergia -completed 12 weeks of PTNS -completed pelvic floor PT -manages with self cathing three times daily   3. Nephrolithiasis -non-contrast CT 2022 2 mm stone in the right kidney lower pole without hydronephrosis.  Three tiny stones in left kidney lower pole region without hydronephrosis   4. rUTI's -contributing factors of age, incomplete emptying, vaginal atrophy, chronic constipation,  -documented positive urine cultures over the last year             12/07/2020 staph epidermis/B-hemolytic strep             12/24/2020 Klebsiella pneumoniae             12/30/2020 Klebsiella pneumoniae/B-hemolytic strep             01/20/2021 multiple species             04/15/2021 streptococcus agalactiae              04/26/2021 Citrobacter freundii   10/21/2021-Enterococcus faecalis  HPI: Cristina Wade is a 51 y.o.female who presents today for a 1 week follow-up.  She reports that she is not able to urinate. She cathed herself this morning and was not able to void till about 2-3pm. She continues to have bilateral back pain in the lumbar area left greater than right and also lower abdominal pain.   Reviewing her logbook over the last week, she is had adequate fluid intake and adequate urinary output.    RUS, 11/08/2021 - normal   Patient denies any modifying or aggravating factors.   Patient denies any gross hematuria or dysuria.  Patient denies any vomiting.      PMH: Past Medical History:  Diagnosis Date   Anxiety    Asthma    Chronic pelvic pain in female 2018   Cystitis    Cystocele    Endometrial polyp    Endometriosis    per pt report but never seen during surg   Frequent headaches    Gastritis    GERD (gastroesophageal reflux disease)    rare   Gross hematuria    History of kidney stones    Microscopic hematuria    Migraine    Migraines    Neuropathy    Osteopenia    Urinary disorder    UTI (lower urinary tract infection)     Surgical History: Past Surgical History:  Procedure Laterality Date   COLONOSCOPY N/A 11/05/2014   Procedure: COLONOSCOPY;  Surgeon: Scot Jun, MD;  Location: [facility] ENDOSCOPY;  Service: Endoscopy;  Laterality: N/A;   COLONOSCOPY  11/2016   Dr. Mechele Collin   CYSTOSCOPY  2007   with biopsy    CYSTOSCOPY N/A 06/01/2016   Procedure: CYSTOSCOPY;  Surgeon: Vena Austria, MD;  Location: [facility] ORS;  Service: Gynecology;  Laterality: N/A;   DIAGNOSTIC LAPAROSCOPY     DILATATION & CURETTAGE/HYSTEROSCOPY WITH MYOSURE N/A 04/13/2015   Procedure: DILATATION & CURETTAGE/HYSTEROSCOPY WITH MYOSURE/POLYPECTOMY;  Surgeon: Elenora Fender Ward, MD;  Location: [facility] ORS;  Service: Gynecology;  Laterality: N/A;   DILATATION & CURETTAGE/HYSTEROSCOPY WITH MYOSURE N/A 06/01/2016   Procedure: DILATATION & CURETTAGE/HYSTEROSCOPY WITH MYOSURE;  Surgeon: Vena Austria, MD;  Location: [facility] ORS;  Service: Gynecology;  Laterality: N/A;   GUM SURGERY     laproscopy  2007   POLYPECTOMY     endometrial    Home Medications:  Allergies as of 11/09/2021       Reactions   Milk (cow) Other (See Comments)   Milk-related Compounds    Omeprazole Other (See Comments)   Pt reports "Chest pain, HA and Dysuria."   Penicillins Itching, Rash        Medication List        Accurate as of November 09, 2021 10:40 PM. If you have any questions, ask your nurse  or doctor.          Azelastine HCl 137 MCG/SPRAY Soln SMARTSIG:2 Spray(s) Both Nares Twice Daily PRN   CALCIUM 1200 PO Take 2-3 tablets by mouth daily. Reported on 05/26/2015   meloxicam 15 MG tablet Commonly known as: MOBIC Take 1 tablet (15 mg total) by mouth daily.   mometasone 50 MCG/ACT nasal spray Commonly known as: NASONEX 2 sprays daily.   nitroGLYCERIN 0.4 MG SL tablet Commonly known as: NITROSTAT Place 1 tablet (0.4 mg total) under the tongue every 5 (five) minutes as needed for up to 25 days for chest pain (Maximum of 3 doses.).   polyethylene glycol 17 g packet Commonly known as: MIRALAX / GLYCOLAX Take 17 g by mouth daily.   Povidone-Iodine 7.5 % Swab Apply 1 applicator topically as needed.   predniSONE 50 MG tablet Commonly known as: DELTASONE Take 1 tablet (50 mg total) by mouth daily with breakfast.   pregabalin 100 MG capsule Commonly known as: LYRICA Take 100 mg by mouth 2 (two) times daily.   psyllium 0.52 g capsule Commonly known as: REGULOID Take 0.52 g by mouth daily.   venlafaxine XR 37.5 MG 24 hr capsule Commonly known as: Effexor XR Take 1 capsule (37.5 mg total) by mouth daily with breakfast.        Allergies:  Allergies  Allergen Reactions   Milk (Cow) Other (See Comments)   Milk-Related Compounds    Omeprazole Other (See Comments)    Pt reports "Chest pain, HA and Dysuria."   Penicillins Itching and Rash    Family History: Family History  Problem Relation Age of Onset   Colon cancer Father 23   Arthritis Father    Hyperlipidemia Father    Transient ischemic attack Father    Cancer - Colon Father 51   Prostate cancer Father 84   Valvular heart disease Father    Arthritis Mother    Heart disease Mother    Stroke Mother        TIA   Hypertension Mother    Diabetes Mother    Kidney cancer Mother 57   Heart failure Mother    Hyperlipidemia Mother    Cancer Maternal Grandmother    Cancer Paternal Grandfather         prostate   Breast cancer Cousin 27       has contact    Social History:  reports that she has never smoked. She has never used smokeless tobacco. She reports that she does not drink alcohol and does not use drugs.   Physical Exam: BP 100/60   Pulse 65   Ht 5'  7" (1.702 m)   Wt 130 lb (59 kg)   LMP 10/10/2021 (Exact Date)   BMI 20.36 kg/m   Constitutional:  Alert and oriented, No acute distress. HEENT: [place] AT, moist mucus membranes.  Trachea midline Cardiovascular: No clubbing, cyanosis, or edema. Respiratory: Normal respiratory effort, no increased work of breathing. Neurologic: Grossly intact, no focal deficits, moving all 4 extremities. Psychiatric: Normal mood and affect.  Laboratory Data: N/A  Pertinent imaging CLINICAL DATA:  Gross hematuria. History of urinary tract infections.   EXAM: RENAL / URINARY TRACT ULTRASOUND COMPLETE   COMPARISON:  None Available.   FINDINGS: Right Kidney:   Renal measurements: 11.8 x 4.3 x 5.5 cm = volume: 146.0 mL. Echogenicity within normal limits. No mass or hydronephrosis visualized.   Left Kidney:   Renal measurements: 10.8 x 5.9 x 5.0 cm = volume: 168.9 mL. Echogenicity within normal limits. No mass or hydronephrosis visualized.   Bladder:   Appears normal for degree of bladder distention.   Other:   None.   IMPRESSION: No hydronephrosis.     Electronically Signed   By: Annia Belt M.D.   On: 11/08/2021 15:33 I have independently reviewed the films.  See HPI.   In and Out Catheterization Patient is present today for a I & O catheterization to demonstrate how to use a close system for catheterization.  Patient was cleaned and prepped in a sterile fashion with betadine . A 14 FR cath was inserted no complications were noted , 400 ml of urine return was noted, urine was yellow, clear in color. And catheter was removed with out difficulty.    Performed by: Michiel Cowboy, PA-C    Assessment & Plan:     Neurogenic bladder  - Currently treating wit CIC x 3 daily  - Per patient she was seen by dr Sherron Monday for possible  interstim and was told she was not a candidate  - She is very anxious and perseverates over poor bladder sensation and inability to void on her own  - Will get a second opinion with Dr. Loni Beckwith at [facility] [address], [address] [place], [place] [address] [phone]  I, Clinton Sawyer Littejohn,acting as a scribe for [facility], PA-C.,have documented all relevant documentation on the behalf of Cristina Thoma, PA-C,as directed by  [facility], PA-C while in the presence of Cristina Goodwill, PA-C.  I have reviewed the above documentation for accuracy and completeness, and I agree with the above.    Michiel Cowboy, PA-C   I spent 30 minutes on the day of the encounter to include pre-visit record review, face-to-face time with the patient, and post-visit ordering of tests.

## 2021-11-10 ENCOUNTER — Telehealth: Payer: Self-pay | Admitting: Internal Medicine

## 2021-11-10 NOTE — Telephone Encounter (Signed)
Patient seen by Terrilee Croak, PA on 7/11 for the same symptoms. Msg fwd to CF to advise.

## 2021-11-10 NOTE — Telephone Encounter (Signed)
Furth, Cadence H, PA-C     Very low suspicion for ACS, however if she continues to have chest pain may be non-cardiac and may need further work- up per ER or PCP.

## 2021-11-10 NOTE — Telephone Encounter (Signed)
Patient made aware of Cadence Furth, [place] response and and recommendation. Patient verbalized understanding and voiced appreciation for the call.

## 2021-11-10 NOTE — Telephone Encounter (Signed)
  Pt c/o of Chest Pain: STAT if CP now or developed within 24 hours  1. Are you having CP right now? No   2. Are you experiencing any other symptoms (ex. SOB, nausea, vomiting, sweating)? None   3. How long have you been experiencing CP? Been awhile   4. Is your CP continuous or coming and going? Coming and going   5. Have you taken Nitroglycerin? No  ?   Pt been having on and off CP and was told its probably due to inflamation, she was given this medication Meloxicam and was told to take it for 2 weeks. If she still having pain to call Dr. Okey Dupre to report and maybe need t get echo to check her out

## 2021-11-14 ENCOUNTER — Other Ambulatory Visit (INDEPENDENT_AMBULATORY_CARE_PROVIDER_SITE_OTHER): Payer: BC Managed Care – PPO

## 2021-11-14 DIAGNOSIS — Z0000 Encounter for general adult medical examination without abnormal findings: Secondary | ICD-10-CM

## 2021-11-14 LAB — CBC WITH DIFFERENTIAL/PLATELET
Basophils Absolute: 0 10*3/uL (ref 0.0–0.1)
Basophils Relative: 0.6 % (ref 0.0–3.0)
Eosinophils Absolute: 0.1 10*3/uL (ref 0.0–0.7)
Eosinophils Relative: 2.2 % (ref 0.0–5.0)
HCT: 40.3 % (ref 36.0–46.0)
Hemoglobin: 13.4 g/dL (ref 12.0–15.0)
Lymphocytes Relative: 28.8 % (ref 12.0–46.0)
Lymphs Abs: 1.1 10*3/uL (ref 0.7–4.0)
MCHC: 33.3 g/dL (ref 30.0–36.0)
MCV: 90.4 fl (ref 78.0–100.0)
Monocytes Absolute: 0.3 10*3/uL (ref 0.1–1.0)
Monocytes Relative: 9.1 % (ref 3.0–12.0)
Neutro Abs: 2.2 10*3/uL (ref 1.4–7.7)
Neutrophils Relative %: 59.3 % (ref 43.0–77.0)
Platelets: 187 10*3/uL (ref 150.0–400.0)
RBC: 4.46 Mil/uL (ref 3.87–5.11)
RDW: 13.4 % (ref 11.5–15.5)
WBC: 3.7 10*3/uL — ABNORMAL LOW (ref 4.0–10.5)

## 2021-11-14 LAB — COMPREHENSIVE METABOLIC PANEL
ALT: 34 U/L (ref 0–35)
AST: 17 U/L (ref 0–37)
Albumin: 4.3 g/dL (ref 3.5–5.2)
Alkaline Phosphatase: 59 U/L (ref 39–117)
BUN: 12 mg/dL (ref 6–23)
CO2: 26 mEq/L (ref 19–32)
Calcium: 9.3 mg/dL (ref 8.4–10.5)
Chloride: 107 mEq/L (ref 96–112)
Creatinine, Ser: 0.61 mg/dL (ref 0.40–1.20)
GFR: 102.23 mL/min (ref >60.00)
Glucose, Bld: 85 mg/dL (ref 70–99)
Potassium: 3.7 mEq/L (ref 3.5–5.1)
Sodium: 141 mEq/L (ref 135–145)
Total Bilirubin: 0.9 mg/dL (ref 0.2–1.2)
Total Protein: 6.6 g/dL (ref 6.0–8.3)

## 2021-11-14 LAB — VITAMIN D 25 HYDROXY (VIT D DEFICIENCY, FRACTURES): VITD: 27.25 ng/mL — ABNORMAL LOW (ref 30.00–100.00)

## 2021-11-14 LAB — LIPID PANEL
Cholesterol: 157 mg/dL (ref 0–200)
HDL: 61.7 mg/dL (ref >39.00)
LDL Cholesterol: 86 mg/dL (ref 0–99)
NonHDL: 95.68
Total CHOL/HDL Ratio: 3
Triglycerides: 48 mg/dL (ref 0.0–149.0)
VLDL: 9.6 mg/dL (ref 0.0–40.0)

## 2021-11-14 LAB — HEMOGLOBIN A1C: Hgb A1c MFr Bld: 5.7 % (ref 4.6–6.5)

## 2021-11-14 LAB — TSH: TSH: 0.04 u[IU]/mL — ABNORMAL LOW (ref 0.35–5.50)

## 2021-11-15 ENCOUNTER — Other Ambulatory Visit: Payer: Self-pay | Admitting: Family

## 2021-11-15 ENCOUNTER — Other Ambulatory Visit: Payer: Self-pay

## 2021-11-15 ENCOUNTER — Encounter: Payer: Self-pay | Admitting: Family

## 2021-11-15 DIAGNOSIS — Z713 Dietary counseling and surveillance: Secondary | ICD-10-CM

## 2021-11-15 DIAGNOSIS — R899 Unspecified abnormal finding in specimens from other organs, systems and tissues: Secondary | ICD-10-CM

## 2021-11-18 ENCOUNTER — Encounter: Payer: BC Managed Care – PPO | Admitting: Family

## 2021-11-18 ENCOUNTER — Ambulatory Visit (INDEPENDENT_AMBULATORY_CARE_PROVIDER_SITE_OTHER): Payer: BC Managed Care – PPO | Admitting: Family

## 2021-11-18 ENCOUNTER — Encounter: Payer: Self-pay | Admitting: Family

## 2021-11-18 VITALS — BP 122/70 | HR 72 | Temp 97.9°F | Ht 70.0 in | Wt 129.2 lb

## 2021-11-18 DIAGNOSIS — R079 Chest pain, unspecified: Secondary | ICD-10-CM | POA: Diagnosis not present

## 2021-11-18 DIAGNOSIS — R519 Headache, unspecified: Secondary | ICD-10-CM

## 2021-11-18 MED ORDER — DICLOFENAC SODIUM 1 % EX GEL: 4.0000 g | Freq: Four times a day (QID) | CUTANEOUS | 2 refills | Status: DC | PRN

## 2021-11-18 NOTE — Patient Instructions (Signed)
Since you have been taking Lexapro every other day, you may go ahead and stop medication entirely.  I prescribed a topical anti-inflammatory Voltaren gel for you to use for your left-sided chest wall pain.    Please stay very vigilant and certainly if you have further concerns of chest pain that were to worsen or develop new symptoms, please do not hesitate to return to the emergency room.  I am glad that you have an upcoming appoint with cardiology.  Nonspecific Chest Pain, Adult Chest pain is an uncomfortable, tight, or painful feeling in the chest. The pain can feel like a crushing, aching, or squeezing pressure. A person can feel a burning or tingling sensation. Chest pain can also be felt in your back, neck, jaw, shoulder, or arm. This pain can be worse when you move, sneeze, or take a deep breath. Chest pain can be caused by a condition that is life-threatening. This must be treated right away. It can also be caused by something that is not life-threatening. If you have chest pain, it can be hard to know the difference, so it is important to get help right away to make sure that you do not have a serious condition. Some life-threatening causes of chest pain include: Heart attack. A tear in the body's main blood vessel (aortic dissection). Inflammation around your heart (pericarditis). A problem in the lungs, such as a blood clot (pulmonary embolism) or a collapsed lung (pneumothorax). Some non life-threatening causes of chest pain include: Heartburn. Anxiety or stress. Damage to the bones, muscles, and cartilage that make up your chest wall. Pneumonia or bronchitis. Shingles infection (varicella-zoster virus). Your chest pain may come and go. It may also be constant. Your health care provider will do tests and other studies to find the cause of your pain. Treatment will depend on the cause of your chest pain. Follow these instructions at home: Medicines Take over-the-counter and  prescription medicines only as told by your health care provider. If you were prescribed an antibiotic medicine, take it as told by your health care provider. Do not stop taking the antibiotic even if you start to feel better. Activity Avoid any activities that cause chest pain. Do not lift anything that is heavier than 10 lb (4.5 kg), or the limit that you are told, until your health care provider says that it is safe. Rest as directed by your health care provider. Return to your normal activities only as told by your health care provider. Ask your health care provider what activities are safe for you. Lifestyle     Do not use any products that contain nicotine or tobacco, such as cigarettes, e-cigarettes, and chewing tobacco. If you need help quitting, ask your health care provider. Do not drink alcohol. Make healthy lifestyle changes as recommended. These may include: Getting regular exercise. Ask your health care provider to suggest some exercises that are safe for you. Eating a heart-healthy diet. This includes plenty of fresh fruits and vegetables, whole grains, low-fat (lean) protein, and low-fat dairy products. A dietitian can help you find healthy eating options. Maintaining a healthy weight. Managing any other health conditions you may have, such as high blood pressure (hypertension) or diabetes. Reducing stress, such as with yoga or relaxation techniques. General instructions Pay attention to any changes in your symptoms. It is up to you to get the results of any tests that were done. Ask your health care provider, or the department that is doing the tests, when your results  will be ready. Keep all follow-up visits as told by your health care provider. This is important. You may be asked to go for further testing if your chest pain does not go away. Contact a health care provider if: Your chest pain does not go away. You feel depressed. You have a fever. You notice changes in  your symptoms or develop new symptoms. Get help right away if: Your chest pain gets worse. You have a cough that gets worse, or you cough up blood. You have severe pain in your abdomen. You faint. You have sudden, unexplained chest discomfort. You have sudden, unexplained discomfort in your arms, back, neck, or jaw. You have shortness of breath at any time. You suddenly start to sweat, or your skin gets clammy. You feel nausea or you vomit. You suddenly feel lightheaded or dizzy. You have severe weakness, or unexplained weakness or fatigue. Your heart begins to beat quickly, or it feels like it is skipping beats. These symptoms may represent a serious problem that is an emergency. Do not wait to see if the symptoms will go away. Get medical help right away. Call your local emergency services (911 in the [place] Do not drive yourself to the hospital. Summary Chest pain can be caused by a condition that is serious and requires urgent treatment. It may also be caused by something that is not life-threatening. Your health care provider may do lab tests and other studies to find the cause of your pain. Follow your health care provider's instructions on taking medicines, making lifestyle changes, and getting emergency treatment if symptoms become worse. Keep all follow-up visits as told by your health care provider. This includes visits for any further testing if your chest pain does not go away. This information is not intended to replace advice given to you by your health care provider. Make sure you discuss any questions you have with your health care provider. Document Revised: 06/17/2020 Document Reviewed: 06/17/2020 Elsevier Patient Education  2023 [employer].

## 2021-11-18 NOTE — Assessment & Plan Note (Signed)
Chest pain remains episodic.  Patient is able to reproduce chest pain as am I.  Very focal distribution.  Reassuring that patient is able to work in her yard and do housework without chest pain.  I did encourage her to adopt a formal exercise program such as walking on a regular basis.  She is upcoming appointment with cardiology, Dr. Avelino Leeds.  I will follow closely.  I did give her topical Voltaren gel today and encouraged her to alternate with heat or ice to see if either is helpful for the chest wall pain.  Counseled her on the importance of remaining hypervigilant and  certainly if chest pain were to intensify or be associated with other symptoms to please  return to emergency room for safety.

## 2021-11-18 NOTE — Assessment & Plan Note (Signed)
Improved.  At this time advised patient to go ahead and stop Lexapro as she has been taking every other day.  She politely declines starting Effexor at this time.  Will monitor anxiety, headaches

## 2021-11-18 NOTE — Progress Notes (Signed)
Subjective:    Patient ID: Cristina Wade, female    DOB: [birth year], 51 y.o.   MRN: [id number]  CC: [name] is a 51 y.o. female who presents today for follow up.   HPI: Headaches have improved.  She continues to feel the Lexapro has aggravated her headaches and now that she is taking Lexapro every other day headaches have improved.  She does not feel particularly anxious. She never started effexor 37.5mg   No CP today. She continues to complain of episodic left sided chest pain when she presses on left side of chest.  No pain when she raises her arms.  She does yard work and house work. She weeds and mows the yard without CP. She has completed mobic 15 days which was helpful for chest pain.   She has appointment with cardiology 12/20/21     Vitamin D deficiency  Nutrition consult has been placed Follow up with neurology 11/14/21 for migraine   HISTORY:  Past Medical History:  Diagnosis Date   Anxiety    Asthma    Chronic pelvic pain in female 2018   Cystitis    Cystocele    Endometrial polyp    Endometriosis    per pt report but never seen during surg   Frequent headaches    Gastritis    GERD (gastroesophageal reflux disease)    rare   Gross hematuria    History of kidney stones    Microscopic hematuria    Migraine    Migraines    Neuropathy    Osteopenia    Urinary disorder    UTI (lower urinary tract infection)    Past Surgical History:  Procedure Laterality Date   COLONOSCOPY N/A 11/05/2014   Procedure: COLONOSCOPY;  Surgeon: Scot Jun, MD;  Location: [facility] ENDOSCOPY;  Service: Endoscopy;  Laterality: N/A;   COLONOSCOPY  11/2016   Dr. Mechele Collin   CYSTOSCOPY  2007   with biopsy    CYSTOSCOPY N/A 06/01/2016   Procedure: CYSTOSCOPY;  Surgeon: Vena Austria, MD;  Location: [facility] ORS;  Service: Gynecology;  Laterality: N/A;   DIAGNOSTIC LAPAROSCOPY     DILATATION & CURETTAGE/HYSTEROSCOPY WITH MYOSURE N/A 04/13/2015   Procedure: DILATATION &  CURETTAGE/HYSTEROSCOPY WITH MYOSURE/POLYPECTOMY;  Surgeon: Elenora Fender Ward, MD;  Location: [facility] ORS;  Service: Gynecology;  Laterality: N/A;   DILATATION & CURETTAGE/HYSTEROSCOPY WITH MYOSURE N/A 06/01/2016   Procedure: DILATATION & CURETTAGE/HYSTEROSCOPY WITH MYOSURE;  Surgeon: Vena Austria, MD;  Location: [facility] ORS;  Service: Gynecology;  Laterality: N/A;   GUM SURGERY     laproscopy  2007   POLYPECTOMY     endometrial   Family History  Problem Relation Age of Onset   Colon cancer Father 66   Arthritis Father    Hyperlipidemia Father    Transient ischemic attack Father    Cancer - Colon Father 75   Prostate cancer Father 19   Valvular heart disease Father    Arthritis Mother    Heart disease Mother    Stroke Mother        TIA   Hypertension Mother    Diabetes Mother    Kidney cancer Mother 58   Heart failure Mother    Hyperlipidemia Mother    Cancer Maternal Grandmother    Cancer Paternal Grandfather        prostate   Breast cancer Cousin 57       has contact    Allergies: Milk (cow), Milk-related compounds, Omeprazole, and Penicillins Current Outpatient Medications  on File Prior to Visit  Medication Sig Dispense Refill   Azelastine HCl 137 MCG/SPRAY SOLN SMARTSIG:2 Spray(s) Both Nares Twice Daily PRN     Calcium Carbonate-Vit D-Min (CALCIUM 1200 PO) Take 2-3 tablets by mouth daily. Reported on 05/26/2015     mometasone (NASONEX) 50 MCG/ACT nasal spray 2 sprays daily.     polyethylene glycol (MIRALAX / GLYCOLAX) 17 g packet Take 17 g by mouth daily.     Povidone-Iodine 7.5 % SWAB Apply 1 applicator topically as needed. 50 each 3   predniSONE (DELTASONE) 50 MG tablet Take 1 tablet (50 mg total) by mouth daily with breakfast. 5 tablet 0   pregabalin (LYRICA) 100 MG capsule Take 100 mg by mouth 2 (two) times daily.     psyllium (REGULOID) 0.52 g capsule Take 0.52 g by mouth daily.     nitroGLYCERIN (NITROSTAT) 0.4 MG SL tablet Place 1 tablet (0.4 mg total) under the tongue  every 5 (five) minutes as needed for up to 25 days for chest pain (Maximum of 3 doses.). 25 tablet 0   Current Facility-Administered Medications on File Prior to Visit  Medication Dose Route Frequency Provider Last Rate Last Admin   lidocaine (XYLOCAINE) 2 % jelly 1 application  1 application  Urethral Once Sondra Come, MD        Social History   Tobacco Use   Smoking status: Never   Smokeless tobacco: Never  Vaping Use   Vaping Use: Never used  Substance Use Topics   Alcohol use: No   Drug use: No    Review of Systems  Constitutional:  Negative for chills and fever.  Respiratory:  Negative for cough and shortness of breath.   Cardiovascular:  Positive for chest pain. Negative for palpitations.  Gastrointestinal:  Negative for nausea and vomiting.  Neurological:  Negative for headaches (controlled at this time).      Objective:    BP 122/70 (BP Location: Left Arm, Patient Position: Sitting, Cuff Size: Normal)   Pulse 72   Temp 97.9 F (36.6 C) (Oral)   Ht 5\' 10"  (1.778 m)   Wt 129 lb 3.2 oz (58.6 kg)   LMP 11/03/2021 (Exact Date)   SpO2 98%   BMI 18.54 kg/m  BP Readings from Last 3 Encounters:  11/18/21 122/70  11/09/21 100/60  11/04/21 118/64   Wt Readings from Last 3 Encounters:  11/18/21 129 lb 3.2 oz (58.6 kg)  11/09/21 130 lb (59 kg)  11/04/21 130 lb 9.6 oz (59.2 kg)    Physical Exam Vitals reviewed.  Constitutional:      Appearance: She is well-developed.  Eyes:     Conjunctiva/sclera: Conjunctivae normal.  Cardiovascular:     Rate and Rhythm: Normal rate and regular rhythm.     Pulses: Normal pulses.     Heart sounds: Normal heart sounds.  Pulmonary:     Effort: Pulmonary effort is normal.     Breath sounds: Normal breath sounds. No wheezing, rhonchi or rales.  Chest:     Chest wall: Tenderness present.       Comments: 1 focal area of tenderness with deep palpation.  No pain otherwise with palpation of the chest wall.  No pain of the  chest as she raises arms laterally or above her head Skin:    General: Skin is warm and dry.  Neurological:     Mental Status: She is alert.  Psychiatric:        Speech: Speech normal.  Behavior: Behavior normal.        Thought Content: Thought content normal.        Assessment & Plan:   Problem List Items Addressed This Visit       Other   Chest pain - Primary    Chest pain remains episodic.  Patient is able to reproduce chest pain as am I.  Very focal distribution.  Reassuring that patient is able to work in her yard and do housework without chest pain.  I did encourage her to adopt a formal exercise program such as walking on a regular basis.  She is upcoming appointment with cardiology, Dr. Avelino Leeds.  I will follow closely.  I did give her topical Voltaren gel today and encouraged her to alternate with heat or ice to see if either is helpful for the chest wall pain.  Counseled her on the importance of remaining hypervigilant and  certainly if chest pain were to intensify or be associated with other symptoms to please  return to emergency room for safety.      Relevant Medications   diclofenac Sodium (VOLTAREN) 1 % GEL   Headache    Improved.  At this time advised patient to go ahead and stop Lexapro as she has been taking every other day.  She politely declines starting Effexor at this time.  Will monitor anxiety, headaches        I have discontinued Malijah Vanaman's meloxicam and venlafaxine XR. I am also having her start on diclofenac Sodium. Additionally, I am having her maintain her Calcium Carbonate-Vit D-Min (CALCIUM 1200 PO), Azelastine HCl, pregabalin, psyllium, polyethylene glycol, mometasone, Povidone-Iodine, nitroGLYCERIN, and predniSONE. We will continue to administer lidocaine.   Meds ordered this encounter  Medications   diclofenac Sodium (VOLTAREN) 1 % GEL    Sig: Apply 4 g topically 4 (four) times daily as needed.    Dispense:  50 g    Refill:  2     Order Specific Question:   Supervising Provider    Answer:   Sherlene Shams [[id number]]    Return precautions given.   Risks, benefits, and alternatives of the medications and treatment plan prescribed today were discussed, and patient expressed understanding.   Education regarding symptom management and diagnosis given to patient on AVS.  Continue to follow with [name], FNP for routine health maintenance.   [name] and I agreed with plan.   Rennie Plowman, FNP

## 2021-11-24 ENCOUNTER — Telehealth: Payer: Self-pay | Admitting: Internal Medicine

## 2021-11-24 NOTE — Telephone Encounter (Signed)
Call sent directly to this RN in triage. Pt c/o chest pain mid-sternal and left chest intermittent since 11/06/21 and experiencing now. C/o sweating and pain in left arm but not currently. Also w/ intermittent headache and nausea that is present at this time. Advised pt call 911 or have someone take her to the ER with reported symptoms. Pt refuses to call 911 or go to the ER.   Pt asks to be scheduled to see Dr. Okey Dupre. Advised pt again that she needs to be evaluated in the ER and pt voices understanding, however, would like to schedule an appointment.   Pt last seen in office with Cadence Fransico Michael, PA-C 10/21/21:  Chest pain Patient reports atypical chest pain for about 3 weeks. She is tender to palpation on the left side. She denies SOB. She had prior ETT, Echo and Cardiac CTA that were all unrevealing. Recent ER visit for chest pain with negative troponin/work-up. EKG today with no ischemic changes. Overall low suspicion for cardiac etiology. Plan to follow-up in a year.   Pt also states she is contacting her PCP regarding management of low TSH. But she would like to be seen in cardiology office as well. Appointment scheduled with Dr. Okey Dupre per pt request 12/09/21 at 1020. Pt understands to call 911 if s/s continue and/or worsen.

## 2021-11-24 NOTE — Telephone Encounter (Signed)
Pt c/o of Chest Pain: STAT if CP now or developed within 24 hours  1. Are you having CP right now? Yes   2. Are you experiencing any other symptoms (ex. SOB, nausea, vomiting, sweating)? Sweating, headache, nausea, pain down left arm  3. How long have you been experiencing CP? Off and on since 11/06/21  4. Is your CP continuous or coming and going? continuous  5. Have you taken Nitroglycerin? No  ?

## 2021-11-28 ENCOUNTER — Encounter: Payer: Self-pay | Admitting: Family

## 2021-11-28 NOTE — Telephone Encounter (Signed)
Patient called and wanted to know who she could be referred to. Patient states her TSH is low and you are having chills off and on for over a year.

## 2021-11-29 ENCOUNTER — Other Ambulatory Visit: Payer: Self-pay | Admitting: Family

## 2021-11-29 ENCOUNTER — Encounter: Payer: Self-pay | Admitting: Family

## 2021-11-29 DIAGNOSIS — Z1211 Encounter for screening for malignant neoplasm of colon: Secondary | ICD-10-CM

## 2021-11-29 DIAGNOSIS — R6883 Chills (without fever): Secondary | ICD-10-CM

## 2021-11-30 ENCOUNTER — Encounter: Payer: Self-pay | Admitting: Family

## 2021-11-30 ENCOUNTER — Ambulatory Visit (INDEPENDENT_AMBULATORY_CARE_PROVIDER_SITE_OTHER): Payer: BC Managed Care – PPO | Admitting: Family

## 2021-11-30 VITALS — BP 124/82 | HR 71 | Temp 97.9°F | Ht 69.0 in | Wt 129.2 lb

## 2021-11-30 DIAGNOSIS — R899 Unspecified abnormal finding in specimens from other organs, systems and tissues: Secondary | ICD-10-CM

## 2021-11-30 DIAGNOSIS — R6883 Chills (without fever): Secondary | ICD-10-CM

## 2021-11-30 DIAGNOSIS — R7989 Other specified abnormal findings of blood chemistry: Secondary | ICD-10-CM

## 2021-11-30 DIAGNOSIS — R232 Flushing: Secondary | ICD-10-CM

## 2021-11-30 DIAGNOSIS — M542 Cervicalgia: Secondary | ICD-10-CM

## 2021-11-30 MED ORDER — PREGABALIN 50 MG PO CAPS: 50.0000 mg | ORAL_CAPSULE | Freq: Two times a day (BID) | ORAL | 2 refills | Status: DC

## 2021-11-30 NOTE — Progress Notes (Signed)
Subjective:    Patient ID: Cristina Wade, female    DOB: 10-05-1970, 51 y.o.   MRN: [id number]  CC: Cristina Wade is a 51 y.o. female who presents today for an acute visit.    HPI: Accompanied by sister Complains of hot flashes x 8 months, sporadic, worse in the heat outside.  She will get sweaty between chests.   No fever, unusual weight loss, constipation, cough, congestion, sheets drenched.  Sleeping with fan.   She is sleeping well. She is no longer on lexapro.   Last menses was 07/2020.    Patient continues to complain of neck pain and tightness.  She thinks this is triggered her headache.   No vision changes. Tried baclofen, imitrex, lyrica as prescribed by Dr Manuella Ghazi. MRI cervical 05/2021 showed mild generative change overall not significantly changed. HISTORY:  Past Medical History:  Diagnosis Date   Anxiety    Asthma    Chronic pelvic pain in female 2018   Cystitis    Cystocele    Endometrial polyp    Endometriosis    per pt report but never seen during surg   Frequent headaches    Gastritis    GERD (gastroesophageal reflux disease)    rare   Gross hematuria    History of kidney stones    Microscopic hematuria    Migraine    Migraines    Neuropathy    Osteopenia    Urinary disorder    UTI (lower urinary tract infection)    Past Surgical History:  Procedure Laterality Date   COLONOSCOPY N/A 11/05/2014   Procedure: COLONOSCOPY;  Surgeon: Cristina Wade;  Location: [facility];  Service: Endoscopy;  Laterality: N/A;   COLONOSCOPY  11/2016   Dr. Vira Wade   CYSTOSCOPY  2007   with biopsy    CYSTOSCOPY N/A 06/01/2016   Procedure: CYSTOSCOPY;  Surgeon: Cristina Wade;  Location: [facility];  Service: Gynecology;  Laterality: N/A;   DIAGNOSTIC LAPAROSCOPY     DILATATION & CURETTAGE/HYSTEROSCOPY WITH MYOSURE N/A 04/13/2015   Procedure: DILATATION & CURETTAGE/HYSTEROSCOPY WITH MYOSURE/POLYPECTOMY;  Surgeon: Cristina Wade;  Location: [facility];  Service:  Gynecology;  Laterality: N/A;   DILATATION & CURETTAGE/HYSTEROSCOPY WITH MYOSURE N/A 06/01/2016   Procedure: DILATATION & CURETTAGE/HYSTEROSCOPY WITH MYOSURE;  Surgeon: Cristina Wade;  Location: [facility];  Service: Gynecology;  Laterality: N/A;   GUM SURGERY     laproscopy  2007   POLYPECTOMY     endometrial   Family History  Problem Relation Age of Onset   Colon cancer Father 22   Arthritis Father    Hyperlipidemia Father    Transient ischemic attack Father    Cancer - Colon Father 52   Prostate cancer Father 61   Valvular heart disease Father    Arthritis Mother    Heart disease Mother    Stroke Mother        TIA   Hypertension Mother    Diabetes Mother    Kidney cancer Mother 61   Heart failure Mother    Hyperlipidemia Mother    Cancer Maternal Grandmother    Cancer Paternal Grandfather        prostate   Breast cancer Cousin 24       has contact    Allergies: Milk (cow), Milk-related compounds, Omeprazole, and Penicillins Current Outpatient Medications on File Prior to Visit  Medication Sig Dispense Refill   Azelastine HCl 137 MCG/SPRAY SOLN SMARTSIG:2 Spray(s) Both Nares Twice Daily  PRN     Calcium Carbonate-Vit D-Min (CALCIUM 1200 PO) Take 2-3 tablets by mouth daily. Reported on 05/26/2015     mometasone (NASONEX) 50 MCG/ACT nasal spray 2 sprays daily.     polyethylene glycol (MIRALAX / GLYCOLAX) 17 g packet Take 17 g by mouth daily.     Povidone-Iodine 7.5 % SWAB Apply 1 applicator topically as needed. 50 each 3   psyllium (REGULOID) 0.52 g capsule Take 0.52 g by mouth daily.     nitroGLYCERIN (NITROSTAT) 0.4 MG SL tablet Place 1 tablet (0.4 mg total) under the tongue every 5 (five) minutes as needed for up to 25 days for chest pain (Maximum of 3 doses.). 25 tablet 0   Current Facility-Administered Medications on File Prior to Visit  Medication Dose Route Frequency Provider Last Rate Last Admin   lidocaine (XYLOCAINE) 2 % jelly 1 application  1 application   Urethral Once Cristina Wade        Social History   Tobacco Use   Smoking status: Never   Smokeless tobacco: Never  Vaping Use   Vaping Use: Never used  Substance Use Topics   Alcohol use: No   Drug use: No    Review of Systems  Constitutional:  Positive for diaphoresis. Negative for chills and fever.  Eyes:  Negative for visual disturbance.  Respiratory:  Negative for cough.   Cardiovascular:  Negative for chest pain and palpitations.  Gastrointestinal:  Negative for nausea and vomiting.  Genitourinary:  Negative for vaginal bleeding.  Musculoskeletal:  Positive for neck pain.  Neurological:  Positive for headaches.      Objective:    BP 124/82 (BP Location: Left Arm, Patient Position: Sitting, Cuff Size: Normal)   Pulse 71   Temp 97.9 F (36.6 C) (Oral)   Ht 5\' 9"  (1.753 m)   Wt 129 lb 3.2 oz (58.6 kg)   LMP 11/03/2021 (Exact Date)   SpO2 98%   BMI 19.08 kg/m  Wt Readings from Last 3 Encounters:  11/30/21 129 lb 3.2 oz (58.6 kg)  11/18/21 129 lb 3.2 oz (58.6 kg)  11/09/21 130 lb (59 kg)      Physical Exam Vitals reviewed.  Constitutional:      Appearance: She is well-developed.  Eyes:     Conjunctiva/sclera: Conjunctivae normal.  Neck:     Thyroid: No thyroid mass or thyromegaly.  Cardiovascular:     Rate and Rhythm: Normal rate and regular rhythm.     Pulses: Normal pulses.     Heart sounds: Normal heart sounds.  Pulmonary:     Effort: Pulmonary effort is normal.     Breath sounds: Normal breath sounds. No wheezing, rhonchi or rales.  Lymphadenopathy:     Head:     Right side of head: No submental, submandibular, tonsillar, preauricular, posterior auricular or occipital adenopathy.     Left side of head: No submental, submandibular, tonsillar, preauricular, posterior auricular or occipital adenopathy.     Cervical: No cervical adenopathy.  Skin:    General: Skin is warm and dry.  Neurological:     Mental Status: She is alert.   Psychiatric:        Speech: Speech normal.        Behavior: Behavior normal.        Thought Content: Thought content normal.        Assessment & Plan:    Problem List Items Addressed This Visit       Cardiovascular and Mediastinum  Vasomotor flushing    No alarm features at this time.  Discussed with patient I suspect perimenopause playing a role.  However recently TSH has been low.  Ordered further thyroid studies, ultrasound of thyroid to further investigate if hyperthyroidism.          Other   Neck pain - Primary    Agree with patient that headache is likely exacerbated by muscle spasm and/or DDD as seen on MRI cervical spine. She failed baclofen. She declines PT referral at this time. Intolerant to lyrica 100mg  bid as prescribed by Dr however agreeable to retrial with lower dose.  Controlled substance contract signed today.  Close follow up.        Relevant Medications   pregabalin (LYRICA) 50 MG capsule   Other Visit Diagnoses     Abnormal TSH       Relevant Orders   Sherryll Burger THYROID   Chills       Abnormal laboratory test            I have discontinued Aiva Suell's pregabalin, predniSONE, and diclofenac Sodium. I am also having her start on pregabalin. Additionally, I am having her maintain her Calcium Carbonate-Vit D-Min (CALCIUM 1200 PO), Azelastine HCl, psyllium, polyethylene glycol, mometasone, Povidone-Iodine, and nitroGLYCERIN. We will continue to administer lidocaine.   Meds ordered this encounter  Medications   pregabalin (LYRICA) 50 MG capsule    Sig: Take 1 capsule (50 mg total) by mouth 2 (two) times daily.    Dispense:  60 capsule    Refill:  2    Order Specific Question:   Supervising Provider    Answer:   Jasmine December [[id number]]    Return precautions given.   Risks, benefits, and alternatives of the medications and treatment plan prescribed today were discussed, and patient expressed understanding.   Education regarding symptom  management and diagnosis given to patient on AVS.  Continue to follow with Sherlene Shams, FNP for routine health maintenance.   [name] and I agreed with plan.   [name], FNP

## 2021-11-30 NOTE — Patient Instructions (Addendum)
Trial of Lyrica 50 mg twice daily for neck pain.   We will look at labs today

## 2021-11-30 NOTE — Assessment & Plan Note (Signed)
Agree with patient that headache is likely exacerbated by muscle spasm and/or DDD as seen on MRI cervical spine. She failed baclofen. She declines PT referral at this time. Intolerant to lyrica 100mg  bid as prescribed by Dr however agreeable to retrial with lower dose.  Controlled substance contract signed today.  Close follow up.

## 2021-11-30 NOTE — Assessment & Plan Note (Signed)
No alarm features at this time.  Discussed with patient I suspect perimenopause playing a role.  However recently TSH has been low.  Ordered further thyroid studies, ultrasound of thyroid to further investigate if hyperthyroidism.

## 2021-12-01 ENCOUNTER — Encounter: Payer: Self-pay | Admitting: Family

## 2021-12-01 LAB — CBC WITH DIFFERENTIAL/PLATELET
Basophils Absolute: 0 10*3/uL (ref 0.0–0.1)
Basophils Relative: 0.7 % (ref 0.0–3.0)
Eosinophils Absolute: 0 10*3/uL (ref 0.0–0.7)
Eosinophils Relative: 0.6 % (ref 0.0–5.0)
HCT: 38.4 % (ref 36.0–46.0)
Hemoglobin: 12.8 g/dL (ref 12.0–15.0)
Lymphocytes Relative: 20.6 % (ref 12.0–46.0)
Lymphs Abs: 1.1 10*3/uL (ref 0.7–4.0)
MCHC: 33.3 g/dL (ref 30.0–36.0)
MCV: 90.7 fl (ref 78.0–100.0)
Monocytes Absolute: 0.5 10*3/uL (ref 0.1–1.0)
Monocytes Relative: 9.5 % (ref 3.0–12.0)
Neutro Abs: 3.8 10*3/uL (ref 1.4–7.7)
Neutrophils Relative %: 68.6 % (ref 43.0–77.0)
Platelets: 199 10*3/uL (ref 150.0–400.0)
RBC: 4.24 Mil/uL (ref 3.87–5.11)
RDW: 12.8 % (ref 11.5–15.5)
WBC: 5.5 10*3/uL (ref 4.0–10.5)

## 2021-12-01 LAB — T3, FREE: T3, Free: 3.4 pg/mL (ref 2.3–4.2)

## 2021-12-01 LAB — T4, FREE: Free T4: 1.47 ng/dL (ref 0.60–1.60)

## 2021-12-01 LAB — PROLACTIN: Prolactin: 8.3 ng/mL

## 2021-12-01 LAB — FOLLICLE STIMULATING HORMONE: FSH: 132.1 m[IU]/mL

## 2021-12-01 LAB — TSH: TSH: 0.02 u[IU]/mL — ABNORMAL LOW (ref 0.35–5.50)

## 2021-12-02 ENCOUNTER — Other Ambulatory Visit: Payer: Self-pay

## 2021-12-02 ENCOUNTER — Encounter: Payer: Self-pay | Admitting: Family

## 2021-12-02 ENCOUNTER — Other Ambulatory Visit: Payer: Self-pay | Admitting: Family

## 2021-12-02 DIAGNOSIS — R519 Headache, unspecified: Secondary | ICD-10-CM

## 2021-12-02 DIAGNOSIS — E0590 Thyrotoxicosis, unspecified without thyrotoxic crisis or storm: Secondary | ICD-10-CM

## 2021-12-02 MED ORDER — VENLAFAXINE HCL ER 37.5 MG PO CP24: 37.5000 mg | ORAL_CAPSULE | Freq: Every day | ORAL | 1 refills | Status: DC

## 2021-12-02 NOTE — Telephone Encounter (Signed)
Pt called stating she talked to the provider about having anxiety and headache prevention medication. Pt would like to see if the medication can be called in

## 2021-12-02 NOTE — Telephone Encounter (Signed)
Called and spoke with patient She prefers to start Effexor and stop Lyrica.  She has not started Lyrica.  She does not think her neck is causing her headaches and denies any neck pain at this time.  I have sent in low-dose effexor

## 2021-12-05 ENCOUNTER — Encounter: Payer: Self-pay | Admitting: Family

## 2021-12-05 ENCOUNTER — Ambulatory Visit
Admission: RE | Admit: 2021-12-05 | Discharge: 2021-12-05 | Disposition: A | Discharge status: Home / Self Care | Payer: BC Managed Care – PPO | Source: Ambulatory Visit | Attending: Family | Admitting: Family

## 2021-12-05 ENCOUNTER — Inpatient Hospital Stay: Admission: RE | Admit: 2021-12-05 | Payer: BC Managed Care – PPO | Source: Ambulatory Visit

## 2021-12-05 DIAGNOSIS — R7989 Other specified abnormal findings of blood chemistry: Secondary | ICD-10-CM | POA: Diagnosis present

## 2021-12-06 ENCOUNTER — Encounter: Payer: Self-pay | Admitting: Family

## 2021-12-06 ENCOUNTER — Telehealth: Payer: Self-pay

## 2021-12-06 NOTE — Telephone Encounter (Signed)
Patient states she would like to know if she needs to have her TSH and white blood cells checked again when she comes in on 12/23/2021 for her lab and chest x-ray appointment.  Patient states we may message her back in MyChart.

## 2021-12-07 ENCOUNTER — Encounter: Payer: Self-pay | Admitting: Family

## 2021-12-07 NOTE — Progress Notes (Signed)
Follow-up Outpatient Visit Date: 12/09/2021  Primary Care Provider: [name], FNP [address] [place] [address]  Chief Complaint: Chest pain, abdominal pain, and nausea  HPI:  Cristina Wade is a 51 y.o. female with history of recurrent chest pain with normal coronary CTA, asthma, GERD, and migraine headaches, who presents for follow-up of chest pain.  She was last seen in our office in early July by Terrilee Croak, [place], at which time she complained of intermittent chest pain that started in mid June.  She was seen in the ED on 10/11/2021, at which time workup was normal.  Pain was felt to be non-cardiac; 1 year follow-up was recommended.  She presented to the ED 4 days later with recurrent chest pain.  Workup was again unremarkable; she was discharged from the ED with a course of prednisone for suspected inflammation.  She reached out to our office again in early August complaining of chest and left arm pain; she was advised to go to the emergency department though she did not proceed with ED evaluation.  Today, Cristina Wade again notes that she has frequent chest pain.  She describes it as an ache in the center and left side of her chest.  It comes on randomly and typically lasts hours.  It is not exertional.  She sometimes has pain and numbness in her left arm, though this is not clearly related to the aforementioned chest pain.  She also complains of frequent upper abdominal pain and nausea, both after eating and when she has not eaten for a while.  She reports having been on omeprazole in the past and feeling like this helped.  However, her allergies notes intolerance to omeprazole due to headache, chest pain, and dysuria (all of the symptoms have persisted despite being off omeprazole).  She also complains of frequent headaches and was recently prescribed baclofen by Dr. Clelia Croft (she has not started this yet).  She denies palpitations, lightheadedness, and edema.  Daily aching in  chest.  Lasts hours.  Not changed by exertion.  Central and sometimes pain in left arm and numbness (not temporally related to chest pain).  Pain in left calf also.  Some numbness.  Wonders if related to how she sits and puts pressure on nerve.  Low TSH.  HR usually 60 at night, 70-75 during the day.  Hasn't tried NTG.  CP sometimes better with NSAIDS.  Some improvement with prednisone.  Sometimes had acid reflux.  Gets nauseated at times.  Stomach pains too.  Sometimes without eating.  Moving bowels okay.  Headaches.  Mild RUQ tenderness.  Negative Murphy's.  --------------------------------------------------------------------------------------------------  Cardiovascular History & Procedures: Cardiovascular Problems: Chest pain   Risk Factors: None   Cath/PCI: None   CV Surgery: None   EP Procedures and Devices: None   Non-Invasive Evaluation(s): Cardiac CTA (12/18/2019): Coronary artery disease.  Coronary calcium score 0.  No incidental findings. TTE (10/31/19): Normal LV size.  LVEF 50-55% with normal wall motion and diastolic function.  Normal RV size and function.  Mild mitral regurgitation.  CVP ~15 mmHg. Exercise tolerance test (10/13/19): Low risk study without evidence of ischemia ([facility] Treadmill Score = +5).  Rare isolated PVC's noted during recovery.  Recent CV Pertinent Labs: Lab Results  Component Value Date   CHOL 157 11/14/2021   HDL 61.70 11/14/2021   LDLCALC 86 11/14/2021   TRIG 48.0 11/14/2021   CHOLHDL 3 11/14/2021   K 3.7 11/14/2021   MG 2.2  12/03/2020   BUN 12 11/14/2021   CREATININE 0.61 11/14/2021    Past medical and surgical history were reviewed and updated in EPIC.  Current Meds  Medication Sig   Azelastine HCl 137 MCG/SPRAY SOLN SMARTSIG:2 Spray(s) Both Nares Twice Daily PRN   Calcium Carbonate-Vit D-Min (CALCIUM 1200 PO) Take 2-3 tablets by mouth daily. Reported on 05/26/2015   mometasone (NASONEX) 50 MCG/ACT nasal spray 2 sprays daily.    polyethylene glycol (MIRALAX / GLYCOLAX) 17 g packet Take 17 g by mouth daily.   Povidone-Iodine 7.5 % SWAB Apply 1 applicator topically as needed.   psyllium (REGULOID) 0.52 g capsule Take 0.52 g by mouth daily.   Current Facility-Administered Medications for the 12/09/21 encounter (Office Visit) with Jenniferlynn Saad, Cristal Deer, MD  Medication   lidocaine (XYLOCAINE) 2 % jelly 1 application    Allergies: Milk (cow), Milk-related compounds, Omeprazole, and Penicillins  Social History   Tobacco Use   Smoking status: Never   Smokeless tobacco: Never  Vaping Use   Vaping Use: Never used  Substance Use Topics   Alcohol use: No   Drug use: No    Family History  Problem Relation Age of Onset   Arthritis Mother    Heart disease Mother    Stroke Mother        TIA   Hypertension Mother    Diabetes Mother    Kidney cancer Mother 60   Heart failure Mother    Hyperlipidemia Mother    Colon cancer Father 68   Arthritis Father    Hyperlipidemia Father    Transient ischemic attack Father    Cancer - Colon Father 67   Prostate cancer Father 46   Valvular heart disease Father    Cancer Maternal Grandmother    Cancer Paternal Grandfather        prostate   Breast cancer Cousin 35       has contact    Review of Systems: A 12-system review of systems was performed and was negative except as noted in the HPI.  --------------------------------------------------------------------------------------------------  Physical Exam: BP 102/70 (BP Location: Left Arm, Patient Position: Sitting, Cuff Size: Normal)   Pulse 60   Ht 5\' 10"  (1.778 m)   Wt 127 lb 3.2 oz (57.7 kg)   LMP 11/03/2021 (Exact Date)   SpO2 99%   BMI 18.25 kg/m   General:  NAD. Neck: No JVD or HJR. Lungs: Clear to auscultation bilaterally without wheezes or crackles. Heart: Regular rate and rhythm without murmurs, rubs, or gallops. Abdomen: Soft with mild right upper quadrant tenderness.  No rebound or guarding.  Murphy's  sign is negative. Extremities: No lower extremity edema.  EKG: Normal sinus rhythm with incomplete right bundle branch block and poor R wave progression in V1 and V2.  No significant change from prior tracing on 10/25/2021.  Lab Results  Component Value Date   WBC 5.5 11/30/2021   HGB 12.8 11/30/2021   HCT 38.4 11/30/2021   MCV 90.7 11/30/2021   PLT 199.0 11/30/2021    Lab Results  Component Value Date   NA 141 11/14/2021   K 3.7 11/14/2021   CL 107 11/14/2021   CO2 26 11/14/2021   BUN 12 11/14/2021   CREATININE 0.61 11/14/2021   GLUCOSE 85 11/14/2021   ALT 34 11/14/2021    Lab Results  Component Value Date   CHOL 157 11/14/2021   HDL 61.70 11/14/2021   LDLCALC 86 11/14/2021   TRIG 48.0 11/14/2021   CHOLHDL 3 11/14/2021    --------------------------------------------------------------------------------------------------  ASSESSMENT AND PLAN: Chest pain, abdominal pain, and nausea: Ms. Poster continues to have frequent episodes of chest pain, which has been longstanding.  My suspicion for coronary insufficiency remains low given atypical nature of the pain and normal coronary CTA in 2021.  We discussed catheterization to definitively assess her coronaries but have agreed to defer for now.  I have recommended a trial of naproxen 250 mg twice daily, as NSAIDs have helped her chest pain in the past.  We will also rechallenge her with omeprazole 20 mg daily.  Given intermittent epigastric pain and nausea, I think it would be worthwhile to exclude biliary disease as a cause of her chest and abdominal discomfort.  We will therefore obtain a right upper quadrant ultrasound at her convenience.  Hyperthyroidism: TSH noted to be suppressed.  Interestingly, heart rate remains low normal.  I am not convinced that her hyperthyroidism is driving her chest pain.  I will defer management to Ms. Arnett and upcoming endocrinology consultation.  Follow-up: Return to clinic in 1  month.  Yvonne Kendall, MD 12/09/2021 10:55 AM

## 2021-12-08 ENCOUNTER — Other Ambulatory Visit: Payer: BC Managed Care – PPO

## 2021-12-08 NOTE — Telephone Encounter (Signed)
Call pt  She doesn't need tsh check however I will look draw thyroid antibodies  Please keep lab appt

## 2021-12-08 NOTE — Telephone Encounter (Signed)
Patient already notified through mychart to keep appointment.

## 2021-12-09 ENCOUNTER — Encounter: Payer: Self-pay | Admitting: Family

## 2021-12-09 ENCOUNTER — Ambulatory Visit (INDEPENDENT_AMBULATORY_CARE_PROVIDER_SITE_OTHER): Payer: BC Managed Care – PPO | Admitting: Internal Medicine

## 2021-12-09 ENCOUNTER — Encounter: Payer: Self-pay | Admitting: Internal Medicine

## 2021-12-09 VITALS — BP 102/70 | HR 60 | Ht 70.0 in | Wt 127.2 lb

## 2021-12-09 DIAGNOSIS — R110 Nausea: Secondary | ICD-10-CM | POA: Insufficient documentation

## 2021-12-09 DIAGNOSIS — R1013 Epigastric pain: Secondary | ICD-10-CM | POA: Insufficient documentation

## 2021-12-09 DIAGNOSIS — R079 Chest pain, unspecified: Secondary | ICD-10-CM | POA: Diagnosis not present

## 2021-12-09 DIAGNOSIS — E0590 Thyrotoxicosis, unspecified without thyrotoxic crisis or storm: Secondary | ICD-10-CM

## 2021-12-09 MED ORDER — OMEPRAZOLE 20 MG PO CPDR: 20.0000 mg | DELAYED_RELEASE_CAPSULE | Freq: Every day | ORAL | 1 refills | Status: DC

## 2021-12-09 MED ORDER — NAPROXEN 250 MG PO TABS: 250.0000 mg | ORAL_TABLET | Freq: Two times a day (BID) | ORAL | Status: DC

## 2021-12-09 NOTE — Patient Instructions (Signed)
Medication Instructions:   Your physician has recommended you make the following change in your medication:   START Naproxen 250 mg TWICE daily - this medication is over-the-counter  START Omeprazole 20 mg daily - An Rx has been sent to your pharmacy  *If you need a refill on your cardiac medications before your next appointment, please call your pharmacy*   Lab Work:  None ordered  Testing/Procedures:  You are scheduled for an Abdominal Ultrasound (right upper quadrant) Tuesday 12/20/21 at 9:30 AM at the below location:  [facility] [address] [place], [place] [address] [phone]    - Please arrive 15 minutes before your scheduled appointment at 9:15 AM.    - You may take your regular morning medications with sips of water.     - Nothing to eat or drink after midnight.  Follow-Up: At [facility], you and your health needs are our priority.  As part of our continuing mission to provide you with exceptional heart care, we have created designated Provider Care Teams.  These Care Teams include your primary Cardiologist (physician) and Advanced Practice Providers (APPs -  Physician Assistants and Nurse Practitioners) who all work together to provide you with the care you need, when you need it.  We recommend signing up for the patient portal called "MyChart".  Sign up information is provided on this After Visit Summary.  MyChart is used to connect with patients for Virtual Visits (Telemedicine).  Patients are able to view lab/test results, encounter notes, upcoming appointments, etc.  Non-urgent messages can be sent to your provider as well.   To learn more about what you can do with MyChart, go to [email or web address].    Your next appointment:   1 month(s)  The format for your next appointment:   In Person  Provider:   You may see Yvonne Kendall, MD or one of the following Advanced Practice Providers on your designated  Care Team:   Nicolasa Ducking, NP Eula Listen, PA-C Cadence Fransico Michael, [place]   Important Information About Sugar

## 2021-12-12 ENCOUNTER — Encounter: Payer: Self-pay | Admitting: Family

## 2021-12-12 NOTE — Telephone Encounter (Signed)
Spoke to patient in regards to  her messages and she is coming in for an ov

## 2021-12-12 NOTE — Telephone Encounter (Signed)
Patient has made an in person appt.

## 2021-12-14 ENCOUNTER — Ambulatory Visit
Admission: RE | Admit: 2021-12-14 | Discharge: 2021-12-14 | Disposition: A | Discharge status: Home / Self Care | Payer: BC Managed Care – PPO | Source: Ambulatory Visit | Attending: Internal Medicine | Admitting: Internal Medicine

## 2021-12-14 ENCOUNTER — Encounter: Payer: Self-pay | Admitting: Internal Medicine

## 2021-12-14 DIAGNOSIS — R110 Nausea: Secondary | ICD-10-CM | POA: Diagnosis present

## 2021-12-14 DIAGNOSIS — R1013 Epigastric pain: Secondary | ICD-10-CM | POA: Insufficient documentation

## 2021-12-14 DIAGNOSIS — R079 Chest pain, unspecified: Secondary | ICD-10-CM | POA: Insufficient documentation

## 2021-12-16 ENCOUNTER — Other Ambulatory Visit: Payer: BC Managed Care – PPO

## 2021-12-20 ENCOUNTER — Other Ambulatory Visit: Payer: BC Managed Care – PPO

## 2021-12-21 ENCOUNTER — Encounter: Payer: Self-pay | Admitting: Family

## 2021-12-21 ENCOUNTER — Encounter: Payer: Self-pay | Admitting: "Endocrinology

## 2021-12-21 ENCOUNTER — Telehealth: Payer: Self-pay | Admitting: "Endocrinology

## 2021-12-21 ENCOUNTER — Ambulatory Visit (INDEPENDENT_AMBULATORY_CARE_PROVIDER_SITE_OTHER): Payer: BC Managed Care – PPO | Admitting: "Endocrinology

## 2021-12-21 VITALS — BP 84/58 | HR 80 | Ht 70.0 in | Wt 126.2 lb

## 2021-12-21 DIAGNOSIS — E0590 Thyrotoxicosis, unspecified without thyrotoxic crisis or storm: Secondary | ICD-10-CM

## 2021-12-21 NOTE — Progress Notes (Signed)
Endocrinology Consult Note                                            12/21/2021, 4:43 PM   Subjective:    Patient ID: Cristina Wade, female    DOB: 02-26-1971, PCP [name], FNP   Past Medical History:  Diagnosis Date   Anxiety    Asthma    Chronic pelvic pain in female 2018   Cystitis    Cystocele    Endometrial polyp    Endometriosis    per pt report but never seen during surg   Frequent headaches    Gastritis    GERD (gastroesophageal reflux disease)    rare   Gross hematuria    History of kidney stones    Microscopic hematuria    Migraine    Migraines    Neuropathy    Osteopenia    Urinary disorder    UTI (lower urinary tract infection)    Past Surgical History:  Procedure Laterality Date   COLONOSCOPY N/A 11/05/2014   Procedure: COLONOSCOPY;  Surgeon: Scot Jun, MD;  Location: [facility] ENDOSCOPY;  Service: Endoscopy;  Laterality: N/A;   COLONOSCOPY  11/2016   Dr. Mechele Collin   CYSTOSCOPY  2007   with biopsy    CYSTOSCOPY N/A 06/01/2016   Procedure: CYSTOSCOPY;  Surgeon: Vena Austria, MD;  Location: [facility] ORS;  Service: Gynecology;  Laterality: N/A;   DIAGNOSTIC LAPAROSCOPY     DILATATION & CURETTAGE/HYSTEROSCOPY WITH MYOSURE N/A 04/13/2015   Procedure: DILATATION & CURETTAGE/HYSTEROSCOPY WITH MYOSURE/POLYPECTOMY;  Surgeon: Elenora Fender Ward, MD;  Location: [facility] ORS;  Service: Gynecology;  Laterality: N/A;   DILATATION & CURETTAGE/HYSTEROSCOPY WITH MYOSURE N/A 06/01/2016   Procedure: DILATATION & CURETTAGE/HYSTEROSCOPY WITH MYOSURE;  Surgeon: Vena Austria, MD;  Location: [facility] ORS;  Service: Gynecology;  Laterality: N/A;   GUM SURGERY     laproscopy  2007   POLYPECTOMY     endometrial   Social History   Socioeconomic History   Marital status: Single    Spouse name: Not on file   Number of children: Not on file   Years of education: Not on file   Highest education level: Not on file  Occupational History   Not on file  Tobacco Use    Smoking status: Never   Smokeless tobacco: Never  Vaping Use   Vaping Use: Never used  Substance and Sexual Activity   Alcohol use: No   Drug use: No   Sexual activity: Not on file  Other Topics Concern   Not on file  Social History Narrative   Lives with twin sister   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    Social Determinants of Health   Financial Resource Strain: Not on file  Food Insecurity: Not on file  Transportation Needs: Not on file  Physical Activity: Not on file  Stress: Not on file  Social Connections: Not on file   Family History  Problem Relation Age of Onset   Arthritis Mother    Heart disease Mother    Stroke Mother        TIA   Hypertension Mother  Diabetes Mother    Kidney cancer Mother 38   Heart failure Mother    Hyperlipidemia Mother    Colon cancer Father 71   Arthritis Father    Hyperlipidemia Father    Transient ischemic attack Father    Cancer - Colon Father 69   Prostate cancer Father 95   Valvular heart disease Father    Cancer Maternal Grandmother    Cancer Paternal Grandfather        prostate   Breast cancer Cousin 71       has contact   Outpatient Encounter Medications as of 12/21/2021  Medication Sig   Multiple Vitamin (MULTIVITAMIN ADULT PO) Take 1 tablet by mouth daily.   ZINC OXIDE PO Take 1 tablet by mouth daily.   Azelastine HCl 137 MCG/SPRAY SOLN SMARTSIG:2 Spray(s) Both Nares Twice Daily PRN   Calcium Carbonate-Vit D-Min (CALCIUM 1200 PO) Take 2-3 tablets by mouth daily. Reported on 05/26/2015   mometasone (NASONEX) 50 MCG/ACT nasal spray 2 sprays daily.   naproxen (NAPROSYN) 250 MG tablet Take 1 tablet (250 mg total) by mouth 2 (two) times daily with a meal.   nitroGLYCERIN (NITROSTAT) 0.4 MG SL tablet Place 1 tablet (0.4 mg total) under the tongue every 5 (five)  minutes as needed for up to 25 days for chest pain (Maximum of 3 doses.).   omeprazole (PRILOSEC) 20 MG capsule Take 1 capsule (20 mg total) by mouth daily. (Patient not taking: Reported on 12/21/2021)   polyethylene glycol (MIRALAX / GLYCOLAX) 17 g packet Take 17 g by mouth daily.   Povidone-Iodine 7.5 % SWAB Apply 1 applicator topically as needed.   psyllium (REGULOID) 0.52 g capsule Take 0.52 g by mouth daily.   [DISCONTINUED] escitalopram (LEXAPRO) 10 MG tablet Take 10 mg by mouth daily.   Facility-Administered Encounter Medications as of 12/21/2021  Medication   lidocaine (XYLOCAINE) 2 % jelly 1 application   ALLERGIES: Allergies  Allergen Reactions   Milk (Cow) Other (See Comments)   Milk-Related Compounds    Omeprazole Other (See Comments)    Pt reports "Chest pain, HA and Dysuria."   Penicillins Itching and Rash    VACCINATION STATUS: Immunization History  Administered Date(s) Administered   Td 09/30/2015   Tdap 09/27/2005    HPI Cristina Wade is 51 y.o. female who presents today with a medical history as above. she is being seen in consultation  for subclinical hyperthyroidism requested by [name], FNP.  She was found to have suppressed TSH on 2 separate occasions, on November 14, 2021 0.04, on November 30, 2021 0.02.  He did have normal free T4 and free T3 in the August blood work. Denies any prior history of thyroid dysfunction.  She reports unintended weight loss of 20 pounds, intermittent palpitations, heat intolerance, and fatigue. She denies any family history of thyroid dysfunction or thyroid malignancy.  She is not taking any thyroid hormone supplements nor antithyroid medications.   Review of Systems  Constitutional: + recent weight loss, +fatigue, + subjective hyperthermia, no subjective hypothermia Eyes: no blurry vision, no xerophthalmia ENT: no sore throat, no nodules palpated in throat, no dysphagia/odynophagia, no hoarseness Cardiovascular: no Chest  Pain, no Shortness of Breath, + on and off  palpitations, no leg swelling Respiratory: no cough, no shortness of breath Gastrointestinal: no Nausea/Vomiting/Diarhhea Musculoskeletal: no muscle/joint aches Skin: no rashes Neurological: no tremors, no numbness, no tingling, no dizziness Psychiatric: no depression, no anxiety  Objective:       12/21/2021  8:49 AM 12/09/2021   10:28 AM 11/30/2021    1:43 PM  Vitals with BMI  Height 5\' 10"  5\' 10"  5\' 9"   Weight 126 lbs 3 oz 127 lbs 3 oz 129 lbs 3 oz  BMI 18.11 18.25 19.07  Systolic 84 102 124  Diastolic 58 70 82  Pulse 80 60 71    BP (!) 84/58   Pulse 80   Ht 5\' 10"  (1.778 m)   Wt 126 lb 3.2 oz (57.2 kg)   BMI 18.11 kg/m   Wt Readings from Last 3 Encounters:  12/21/21 126 lb 3.2 oz (57.2 kg)  12/09/21 127 lb 3.2 oz (57.7 kg)  11/30/21 129 lb 3.2 oz (58.6 kg)    Physical Exam  Constitutional:  Body mass index is 18.11 kg/m.,  not in acute distress, normal state of mind Eyes: PERRLA, EOMI, no exophthalmos ENT: moist mucous membranes, no gross thyromegaly, no gross cervical lymphadenopathy Cardiovascular: normal precordial activity, Regular Rate and Rhythm, no Murmur/Rubs/Gallops Respiratory:  adequate breathing efforts, no gross chest deformity, Clear to auscultation bilaterally Gastrointestinal: abdomen soft, Non -tender, No distension, Bowel Sounds present, no gross organomegaly Musculoskeletal: no gross deformities, strength intact in all four extremities Skin: moist, warm, no rashes Neurological: no tremor with outstretched hands, Deep tendon reflexes normal in bilateral lower extremities.  CMP ( most recent) CMP     Component Value Date/Time   NA 141 11/14/2021 0814   K 3.7 11/14/2021 0814   CL 107 11/14/2021 0814   CO2 26 11/14/2021 0814   GLUCOSE 85 11/14/2021 0814   BUN 12 11/14/2021 0814   CREATININE 0.61 11/14/2021 0814   CALCIUM 9.3 11/14/2021 0814   PROT 6.6 11/14/2021 0814   ALBUMIN 4.3 11/14/2021 0814    AST 17 11/14/2021 0814   ALT 34 11/14/2021 0814   ALKPHOS 59 11/14/2021 0814   BILITOT 0.9 11/14/2021 0814   GFRNONAA >60 10/25/2021 1428   GFRAA >60 12/11/2019 1524     Diabetic Labs (most recent): Lab Results  Component Value Date   HGBA1C 5.7 11/14/2021   HGBA1C 5.8 04/15/2021   HGBA1C 5.7 11/10/2020     Lipid Panel ( most recent) Lipid Panel     Component Value Date/Time   CHOL 157 11/14/2021 0814   TRIG 48.0 11/14/2021 0814   HDL 61.70 11/14/2021 0814   CHOLHDL 3 11/14/2021 0814   VLDL 9.6 11/14/2021 0814   LDLCALC 86 11/14/2021 0814      Lab Results  Component Value Date   TSH 0.02 (L) 11/30/2021   TSH 0.04 (L) 11/14/2021   TSH 1.30 04/15/2021   TSH 1.95 11/10/2020   TSH 1.46 06/03/2018   TSH 1.79 11/29/2016   FREET4 1.47 11/30/2021       Assessment & Plan:   1.  Subclinical Hyperthyroidism  - Pandora Mccrackin  is being seen at a kind request of Arnett, 06/05/2018, FNP. - I have reviewed her available thyroid records and clinically evaluated the patient. - Based on these reviews, she has subclinical hyperthyroidism,  however,  there is not sufficient information to proceed with definitive treatment plan.  - she will need a confirmatory test-thyroid uptake and scan in light of the fact that she has significant, however nonspecific symptoms.      If her uptake confirms primary hyperparathyroidism, she will be considered for treatment options including radioactive iodine thyroid ablation.     - I did not initiate any new prescriptions today. - she is advised to maintain  close follow up with [name], FNP for primary care needs.   - Time spent with the patient: 50 minutes, of which >50% was spent in  counseling her about her subclinical hypothyroidism and the rest in obtaining information about her symptoms, reviewing her previous labs/studies ( including abstractions from other facilities),  evaluations, and treatments,  and developing a plan to  confirm diagnosis and long term treatment based on the latest standards of care/guidelines; and documenting her care.  Cristina Wade participated in the discussions, expressed understanding, and voiced agreement with the above plans.  All questions were answered to her satisfaction. she is encouraged to contact clinic should she have any questions or concerns prior to her return visit.  Follow up plan: Return in about 2 weeks (around 01/04/2022) for F/U with Thyroid Uptake and Scan.   Marquis Lunch, MD [facility] [address] [place], [place] [address] Phone: [phone]  Fax: [phone]     12/21/2021, 4:43 PM  This note was partially dictated with voice recognition software. Similar sounding words can be transcribed inadequately or may not  be corrected upon review.

## 2021-12-21 NOTE — Telephone Encounter (Signed)
Pt's ultrasound results are in mychart.

## 2021-12-23 ENCOUNTER — Ambulatory Visit (INDEPENDENT_AMBULATORY_CARE_PROVIDER_SITE_OTHER): Payer: BC Managed Care – PPO

## 2021-12-23 ENCOUNTER — Other Ambulatory Visit (INDEPENDENT_AMBULATORY_CARE_PROVIDER_SITE_OTHER): Payer: BC Managed Care – PPO

## 2021-12-23 ENCOUNTER — Other Ambulatory Visit: Payer: BC Managed Care – PPO

## 2021-12-23 DIAGNOSIS — E0590 Thyrotoxicosis, unspecified without thyrotoxic crisis or storm: Secondary | ICD-10-CM

## 2021-12-24 LAB — THYROTROPIN RECEPTOR AUTOABS: Thyrotropin Receptor Ab: 1.15 IU/L (ref 0.00–1.75)

## 2021-12-26 ENCOUNTER — Other Ambulatory Visit: Payer: BC Managed Care – PPO

## 2021-12-31 ENCOUNTER — Other Ambulatory Visit: Payer: Self-pay | Admitting: Family

## 2022-01-02 ENCOUNTER — Encounter
Admission: RE | Admit: 2022-01-02 | Discharge: 2022-01-02 | Disposition: A | Discharge status: Home / Self Care | Payer: BC Managed Care – PPO | Source: Ambulatory Visit | Attending: "Endocrinology | Admitting: "Endocrinology

## 2022-01-02 DIAGNOSIS — E0590 Thyrotoxicosis, unspecified without thyrotoxic crisis or storm: Secondary | ICD-10-CM | POA: Insufficient documentation

## 2022-01-02 MED ORDER — SODIUM IODIDE I-123 7.4 MBQ CAPS
284.3000 | ORAL_CAPSULE | Freq: Once | ORAL | Status: AC
  Administered 2022-01-02: 284.3 via ORAL

## 2022-01-03 ENCOUNTER — Encounter
Admission: RE | Admit: 2022-01-03 | Discharge: 2022-01-03 | Disposition: A | Discharge status: Home / Self Care | Payer: BC Managed Care – PPO | Source: Ambulatory Visit | Attending: "Endocrinology | Admitting: "Endocrinology

## 2022-01-03 NOTE — Progress Notes (Deleted)
01/03/22 10:56 AM   [name] 1970/09/09 [id number]  Referring provider:  Burnard Hawthorne, FNP [address] [place],  [place] [address]  Urological history  1. High risk hematuria -non-smoker -work up 2015 -bilateral punctate stones -cysto 2019 NED -non-contrast CT 2022 - bilateral punctate stones -cysto, 2022 - NED -RUS, 10/2021 - NED -reports of gross heme   2. Neurogenic bladder -UDS 2017 indicating nonobstructive urinary retention with poor bladder sensation and external sphincter dyssynergia -completed 12 weeks of PTNS -completed pelvic floor PT -manages with self cathing three times daily -has appointment w/ Dr. Alto Denver on 03/08/2022   3. Nephrolithiasis -non-contrast CT 2022 2 mm stone in the right kidney lower pole without hydronephrosis.  Three tiny stones in left kidney lower pole region without hydronephrosis   4. rUTI's -contributing factors of age, incomplete emptying, vaginal atrophy, chronic constipation,  -documented positive urine cultures over the last year             01/20/2021 multiple species             04/15/2021 streptococcus agalactiae              04/26/2021 Citrobacter freundii    10/21/2021-Enterococcus faecalis  No chief complaint on file.    HPI: Skai Lickteig is a 51 y.o.female who presents today for gross heme.         PMH: Past Medical History:  Diagnosis Date   Anxiety    Asthma    Chronic pelvic pain in female 2018   Cystitis    Cystocele    Endometrial polyp    Endometriosis    per pt report but never seen during surg   Frequent headaches    Gastritis    GERD (gastroesophageal reflux disease)    rare   Gross hematuria    History of kidney stones    Microscopic hematuria    Migraine    Migraines    Neuropathy    Osteopenia    Urinary disorder    UTI (lower urinary tract infection)     Surgical History: Past Surgical History:  Procedure Laterality Date   COLONOSCOPY N/A 11/05/2014   Procedure:  COLONOSCOPY;  Surgeon: Manya Silvas, MD;  Location: [facility];  Service: Endoscopy;  Laterality: N/A;   COLONOSCOPY  11/2016   Dr. Vira Agar   CYSTOSCOPY  2007   with biopsy    CYSTOSCOPY N/A 06/01/2016   Procedure: CYSTOSCOPY;  Surgeon: Malachy Mood, MD;  Location: [facility] ORS;  Service: Gynecology;  Laterality: N/A;   DIAGNOSTIC LAPAROSCOPY     DILATATION & CURETTAGE/HYSTEROSCOPY WITH MYOSURE N/A 04/13/2015   Procedure: DILATATION & CURETTAGE/HYSTEROSCOPY WITH MYOSURE/POLYPECTOMY;  Surgeon: Honor Loh Ward, MD;  Location: [facility] ORS;  Service: Gynecology;  Laterality: N/A;   DILATATION & CURETTAGE/HYSTEROSCOPY WITH MYOSURE N/A 06/01/2016   Procedure: DILATATION & CURETTAGE/HYSTEROSCOPY WITH MYOSURE;  Surgeon: Malachy Mood, MD;  Location: [facility] ORS;  Service: Gynecology;  Laterality: N/A;   GUM SURGERY     laproscopy  2007   POLYPECTOMY     endometrial    Home Medications:  Allergies as of 01/04/2022       Reactions   Milk (cow) Other (See Comments)   Milk-related Compounds    Omeprazole Other (See Comments)   Pt reports "Chest pain, HA and Dysuria."   Penicillins Itching, Rash        Medication List        Accurate as of January 03, 2022 10:56 AM. If you  have any questions, ask your nurse or doctor.          Azelastine HCl 137 MCG/SPRAY Soln SMARTSIG:2 Spray(s) Both Nares Twice Daily PRN   CALCIUM 1200 PO Take 2-3 tablets by mouth daily. Reported on 05/26/2015   mometasone 50 MCG/ACT nasal spray Commonly known as: NASONEX 2 sprays daily.   MULTIVITAMIN ADULT PO Take 1 tablet by mouth daily.   naproxen 250 MG tablet Commonly known as: NAPROSYN Take 1 tablet (250 mg total) by mouth 2 (two) times daily with a meal.   nitroGLYCERIN 0.4 MG SL tablet Commonly known as: NITROSTAT Place 1 tablet (0.4 mg total) under the tongue every 5 (five) minutes as needed for up to 25 days for chest pain (Maximum of 3 doses.).   omeprazole 20 MG capsule Commonly known  as: PRILOSEC Take 1 capsule (20 mg total) by mouth daily.   polyethylene glycol 17 g packet Commonly known as: MIRALAX / GLYCOLAX Take 17 g by mouth daily.   Povidone-Iodine 7.5 % Swab Apply 1 applicator topically as needed.   psyllium 0.52 g capsule Commonly known as: REGULOID Take 0.52 g by mouth daily.   ZINC OXIDE PO Take 1 tablet by mouth daily.        Allergies:  Allergies  Allergen Reactions   Milk (Cow) Other (See Comments)   Milk-Related Compounds    Omeprazole Other (See Comments)    Pt reports "Chest pain, HA and Dysuria."   Penicillins Itching and Rash    Family History: Family History  Problem Relation Age of Onset   Arthritis Mother    Heart disease Mother    Stroke Mother        TIA   Hypertension Mother    Diabetes Mother    Kidney cancer Mother 88   Heart failure Mother    Hyperlipidemia Mother    Colon cancer Father 48   Arthritis Father    Hyperlipidemia Father    Transient ischemic attack Father    Cancer - Colon Father 65   Prostate cancer Father 8   Valvular heart disease Father    Cancer Maternal Grandmother    Cancer Paternal Grandfather        prostate   Breast cancer Cousin 65       has contact    Social History:  reports that she has never smoked. She has never used smokeless tobacco. She reports that she does not drink alcohol and does not use drugs.   Physical Exam: There were no vitals taken for this visit.  Constitutional:  Well nourished. Alert and oriented, No acute distress. HEENT: [place] AT, moist mucus membranes.  Trachea midline, no masses. Cardiovascular: No clubbing, cyanosis, or edema. Respiratory: Normal respiratory effort, no increased work of breathing. GI: Abdomen is soft, non tender, non distended, no abdominal masses. Liver and spleen not palpable.  No hernias appreciated.  Stool sample for occult testing is not indicated.   GU: No CVA tenderness.  No bladder fullness or masses.  *** external genitalia, ***  pubic hair distribution, no lesions.  Normal urethral meatus, no lesions, no prolapse, no discharge.   No urethral masses, tenderness and/or tenderness. No bladder fullness, tenderness or masses. *** vagina mucosa, *** estrogen effect, no discharge, no lesions, *** pelvic support, *** cystocele and *** rectocele noted.  No cervical motion tenderness.  Uterus is freely mobile and non-fixed.  No adnexal/parametria masses or tenderness noted.  Anus and perineum are without rashes or lesions.   ***  Skin: No rashes, bruises or suspicious lesions. Lymph: No cervical or inguinal adenopathy. Neurologic: Grossly intact, no focal deficits, moving all 4 extremities. Psychiatric: Normal mood and affect.    Laboratory Data:    Latest Ref Rng & Units 11/30/2021    2:47 PM 11/14/2021    8:14 AM 10/25/2021    2:28 PM  CBC  WBC 4.0 - 10.5 K/uL 5.5  3.7  6.0   Hemoglobin 12.0 - 15.0 g/dL 44.9  67.5  91.6   Hematocrit 36.0 - 46.0 % 38.4  40.3  43.2   Platelets 150.0 - 400.0 K/uL 199.0  187.0  226      Pertinent imaging N/A  Assessment & Plan:    1. Gross heme -CATH UA ***  Neurogenic bladder  - Currently treating wit [facility] x 3 daily  - Per patient she was seen by dr Sherron Monday for possible  interstim and was told she was not a candidate  - She is very anxious and perseverates over poor bladder sensation and inability to void on her own  - Will get a second opinion with Dr. Loni Beckwith at [facility], [facility] [address] [place], [place] [address] [phone]

## 2022-01-04 ENCOUNTER — Ambulatory Visit: Payer: BC Managed Care – PPO | Admitting: Urology

## 2022-01-05 ENCOUNTER — Encounter: Payer: BC Managed Care – PPO | Attending: Family | Admitting: Dietician

## 2022-01-05 ENCOUNTER — Encounter: Payer: Self-pay | Admitting: Dietician

## 2022-01-05 VITALS — Ht 70.0 in | Wt 127.9 lb

## 2022-01-05 DIAGNOSIS — E0590 Thyrotoxicosis, unspecified without thyrotoxic crisis or storm: Secondary | ICD-10-CM

## 2022-01-05 DIAGNOSIS — Z713 Dietary counseling and surveillance: Secondary | ICD-10-CM

## 2022-01-05 NOTE — Patient Instructions (Addendum)
Make sure to eat a meal or snack every 3-5 hours during the day Include a protein food along with 1 or more other food groups with each meal and snack Keep carbs to 3 or 4 servings at any one meal, which is 45 - 60 grams.

## 2022-01-05 NOTE — Progress Notes (Signed)
Medical Nutrition Therapy: Visit start time: 1630  end time: 1750  Assessment:   Referral Diagnosis: nutrition counseling, patient states diagnosis of pre-diabetes Other medical history/ diagnoses: hyperthyroidism/ low TSH, neuropathy, constipation Psychosocial issues/ stress concerns: anxiety  Medications, supplements: reconciled list in medical record   Preferred learning method:  Auditory    Current weight: 127.9lbs Height: 5'10" BMI: 18.35 Patient's personal weight goal: 128 - 140lbs  Progress and evaluation:  Mother had type 2 diabetes; took insulin  Patient reports she has been reducing carb intake, including sugar; also reducing salt intake HbA1C was 5.7% 11/14/21; patient reports low TSH, which was 0.02 11/30/21 She reports her weight was up to about 140lbs but recently has dropped to about 128lbs with diet changes, thyroid issues. Food allergies: milk allergy Special diet practices: none Patient seeks help with reducing risk for diabetes Next PCP appt is 11/2022; has endocrinology appt next week   Dietary Intake:  Usual eating pattern includes 3 meals and 0-1 snacks per day. Dining out frequency: 1-4 meals per week. Who plans meals/ buys groceries? Self, sister Who prepares meals? Self, sister  Breakfast: 2 strips [place] bacon on 1 slice bread + granola bar with nuts Snack: none Lunch: at school -- 9/21 cheese bites, salad  Snack: none Supper: varies -- 9/20 soup with pintos, tomato/ ketchup; 9/19 chicken with sauce + rice and veg and bread stick + 2 apples Snack: peanuts; granola bar Beverages: water only recently  Physical activity: on the job activity (works at [employer])   Intervention:   Nutrition Care Education:   Basic nutrition: basic food groups; appropriate nutrient balance; appropriate meal and snack schedule; general nutrition guidelines    Weight control: identifying healthy weight; role of exercise in gaining muscle tone and reducing loose  skin; importance of adequate nutrition for maintaining/ gaining muscle and maintaining health Pre-diabetes:  appropriate meal and snack schedule; appropriate carb intake and balance, healthy carb choices; role of fiber, protein, fat; physical activity   Other intervention notes: Patient and twin sister were present at the visit Patient has been making positive changes to control carb intake Encouraged minimum weight goal to reach BMI of 19-20; 130-140lbs, with inclusion of regular exercise. Established goals with input from patient.  Encouraged tracking intake with food journal which can be reviewed at follow up.   Nutritional Diagnosis:  [place]-2.2 Altered nutrition-related laboratory As related to prediabetes.  As evidenced by elevated HbA1C. [place]-3.1 Underweight As related to recent diet changes and low TSH.  As evidenced by patient with current BMI of 18.35.   Education Materials given:  [employer] guidelines for Diabetes Plate Planner with food lists, sample meal pattern Sample menus Snacking handout Visit summary with goals/ instructions   Learner/ who was taught:  Patient  Family member: twin sister Cristina Wade   Level of understanding: Verbalizes understanding of key concepts  Demonstrated degree of understanding via:   Teach back Learning barriers: None  Willingness to learn/ readiness for change: Eager, change in progress   Monitoring and Evaluation:  Dietary intake, exercise, HbA1C level, and body weight      follow up:  03/13/22 at 3:30pm

## 2022-01-09 ENCOUNTER — Ambulatory Visit (INDEPENDENT_AMBULATORY_CARE_PROVIDER_SITE_OTHER): Payer: BC Managed Care – PPO | Admitting: "Endocrinology

## 2022-01-09 ENCOUNTER — Encounter: Payer: Self-pay | Admitting: "Endocrinology

## 2022-01-09 VITALS — BP 98/68 | HR 68 | Ht 70.0 in | Wt 128.0 lb

## 2022-01-09 DIAGNOSIS — E061 Subacute thyroiditis: Secondary | ICD-10-CM

## 2022-01-09 NOTE — Progress Notes (Signed)
01/09/2022, 6:46 PM   Endocrinology follow-up note  Subjective:    Patient ID: Cristina Wade, female    DOB: 1970-11-12, PCP Burnard Hawthorne, FNP   Past Medical History:  Diagnosis Date   Anxiety    Asthma    Chronic pelvic pain in female 2018   Cystitis    Cystocele    Endometrial polyp    Endometriosis    per pt report but never seen during surg   Frequent headaches    Gastritis    GERD (gastroesophageal reflux disease)    rare   Gross hematuria    History of kidney stones    Microscopic hematuria    Migraine    Migraines    Neuropathy    Osteopenia    Urinary disorder    UTI (lower urinary tract infection)    Past Surgical History:  Procedure Laterality Date   COLONOSCOPY N/A 11/05/2014   Procedure: COLONOSCOPY;  Surgeon: Manya Silvas, MD;  Location: [facility];  Service: Endoscopy;  Laterality: N/A;   COLONOSCOPY  11/2016   Dr. Vira Agar   CYSTOSCOPY  2007   with biopsy    CYSTOSCOPY N/A 06/01/2016   Procedure: CYSTOSCOPY;  Surgeon: Malachy Mood, MD;  Location: [facility];  Service: Gynecology;  Laterality: N/A;   DIAGNOSTIC LAPAROSCOPY     DILATATION & CURETTAGE/HYSTEROSCOPY WITH MYOSURE N/A 04/13/2015   Procedure: DILATATION & CURETTAGE/HYSTEROSCOPY WITH MYOSURE/POLYPECTOMY;  Surgeon: Honor Loh Ward, MD;  Location: [facility];  Service: Gynecology;  Laterality: N/A;   DILATATION & CURETTAGE/HYSTEROSCOPY WITH MYOSURE N/A 06/01/2016   Procedure: DILATATION & CURETTAGE/HYSTEROSCOPY WITH MYOSURE;  Surgeon: Malachy Mood, MD;  Location: [facility];  Service: Gynecology;  Laterality: N/A;   GUM SURGERY     laproscopy  2007   POLYPECTOMY     endometrial   Social History   Socioeconomic History   Marital status: Single    Spouse name: Not on file   Number of children: Not on file   Years of education: Not on file   Highest education level: Not on file  Occupational History   Not on file  Tobacco  Use   Smoking status: Never   Smokeless tobacco: Never  Vaping Use   Vaping Use: Never used  Substance and Sexual Activity   Alcohol use: No   Drug use: No   Sexual activity: Not on file  Other Topics Concern   Not on file  Social History Narrative   Lives with twin sister   Work- [employer]    No pets    No children    Right handed    No caffeine daily- tea occasionally; eats chocolate    Enjoys shopping, resting, [place]    Social Determinants of Health   Financial Resource Strain: Not on file  Food Insecurity: Not on file  Transportation Needs: Not on file  Physical Activity: Not on file  Stress: Not on file  Social Connections: Not on file   Family History  Problem Relation Age of Onset   Arthritis Mother    Heart disease Mother    Stroke Mother        TIA   Hypertension  Mother    Diabetes Mother    Kidney cancer Mother 3   Heart failure Mother    Hyperlipidemia Mother    Colon cancer Father 3   Arthritis Father    Hyperlipidemia Father    Transient ischemic attack Father    Cancer - Colon Father 4   Prostate cancer Father 20   Valvular heart disease Father    Cancer Maternal Grandmother    Cancer Paternal Grandfather        prostate   Breast cancer Cousin 6       has contact   Outpatient Encounter Medications as of 01/09/2022  Medication Sig   Azelastine HCl 137 MCG/SPRAY SOLN SMARTSIG:2 Spray(s) Both Nares Twice Daily PRN   Calcium Carbonate-Vit D-Min (CALCIUM 1200 PO) Take 2-3 tablets by mouth daily. Reported on 05/26/2015   mometasone (NASONEX) 50 MCG/ACT nasal spray 2 sprays daily.   Multiple Vitamin (MULTIVITAMIN ADULT PO) Take 1 tablet by mouth daily.   naproxen (NAPROSYN) 250 MG tablet Take 1 tablet (250 mg total) by mouth 2 (two) times daily with a meal. (Patient not taking: Reported on 01/05/2022)   nitroGLYCERIN (NITROSTAT) 0.4 MG SL tablet Place 1 tablet (0.4 mg total) under the tongue every 5 (five)  minutes as needed for up to 25 days for chest pain (Maximum of 3 doses.).   omeprazole (PRILOSEC) 20 MG capsule Take 1 capsule (20 mg total) by mouth daily. (Patient not taking: Reported on 12/21/2021)   polyethylene glycol (MIRALAX / GLYCOLAX) 17 g packet Take 17 g by mouth daily.   Povidone-Iodine 7.5 % SWAB Apply 1 applicator topically as needed.   Probiotic Product (PROBIOTIC DAILY PO) Take 1 tablet by mouth daily.   psyllium (REGULOID) 0.52 g capsule Take 0.52 g by mouth daily.   ZINC OXIDE PO Take 1 tablet by mouth daily.   Facility-Administered Encounter Medications as of 01/09/2022  Medication   lidocaine (XYLOCAINE) 2 % jelly 1 application   ALLERGIES: Allergies  Allergen Reactions   Milk (Cow) Other (See Comments)   Milk-Related Compounds    Omeprazole Other (See Comments)    Pt reports "Chest pain, HA and Dysuria." 01/05/22 patient states she is able to take omeprazole now without a reaction   Penicillins Itching and Rash    VACCINATION STATUS: Immunization History  Administered Date(s) Administered   Td 09/30/2015   Tdap 09/27/2005    HPI Cristina Wade is 51 y.o. female who presents today with a medical history as above. she is being seen in follow-up after she was seen in consultation  for subclinical hyperthyroidism requested by Burnard Hawthorne, FNP.  She was found to have suppressed TSH on 2 separate occasions, on November 14, 2021 0.04, on November 30, 2021 0.02.  she did have normal free T4 and free T3 in the August blood work. After her last visit, she was sent for thyroid uptake and scan which was found to have a significant finding of suppressed uptake at 1.1% at 24 hours.  No new complaints today.  She reports feeling better in reversing her previous symptoms.  Denies any prior history of thyroid dysfunction.  She has regained some of the weight she lost prior to her last visit.   Denies anxiety, palpitations, nor heat intolerance.    She denies any family history of  thyroid dysfunction or thyroid malignancy.  She is not taking any thyroid hormone supplements nor antithyroid medications.   Review of Systems  Constitutional: + mildely fluctuating body weight,  recent weight loss, - fatigue, - subjective hyperthermia, no subjective hypothermia Eyes: no blurry vision, no xerophthalmia   Objective:       01/09/2022    4:01 PM 01/05/2022    3:40 PM 12/21/2021    8:49 AM  Vitals with BMI  Height 5\' 10"  5\' 10"  5\' 10"   Weight 128 lbs 127 lbs 14 oz 126 lbs 3 oz  BMI 18.37 [id number] [id number]  Systolic 98  84  Diastolic 68  58  Pulse 68  80    BP 98/68   Pulse 68   Ht 5\' 10"  (1.778 m)   Wt 128 lb (58.1 kg)   BMI 18.37 kg/m   Wt Readings from Last 3 Encounters:  01/09/22 128 lb (58.1 kg)  01/05/22 127 lb 14.4 oz (58 kg)  12/21/21 126 lb 3.2 oz (57.2 kg)    Physical Exam  Constitutional:  Body mass index is 18.37 kg/m.,  not in acute distress, normal state of mind   CMP ( most recent) CMP     Component Value Date/Time   NA 141 11/14/2021 0814   K 3.7 11/14/2021 0814   CL 107 11/14/2021 0814   CO2 26 11/14/2021 0814   GLUCOSE 85 11/14/2021 0814   BUN 12 11/14/2021 0814   CREATININE 0.61 11/14/2021 0814   CALCIUM 9.3 11/14/2021 0814   PROT 6.6 11/14/2021 0814   ALBUMIN 4.3 11/14/2021 0814   AST 17 11/14/2021 0814   ALT 34 11/14/2021 0814   ALKPHOS 59 11/14/2021 0814   BILITOT 0.9 11/14/2021 0814   GFRNONAA >60 10/25/2021 1428   GFRAA >60 12/11/2019 1524     Diabetic Labs (most recent): Lab Results  Component Value Date   HGBA1C 5.7 11/14/2021   HGBA1C 5.8 04/15/2021   HGBA1C 5.7 11/10/2020     Lipid Panel ( most recent) Lipid Panel     Component Value Date/Time   CHOL 157 11/14/2021 0814   TRIG 48.0 11/14/2021 0814   HDL 61.70 11/14/2021 0814   CHOLHDL 3 11/14/2021 0814   VLDL 9.6 11/14/2021 0814   LDLCALC 86 11/14/2021 0814      Lab Results  Component Value Date   TSH 0.02 (L) 11/30/2021   TSH 0.04 (L) 11/14/2021    TSH 1.30 04/15/2021   TSH 1.95 11/10/2020   TSH 1.46 06/03/2018   TSH 1.79 11/29/2016   FREET4 1.47 11/30/2021       Assessment & Plan:   Subacute thyroiditis  -Reviewed her thyroid uptake and scan with her.  24-hour uptake is only 1.1%.  Her findings are consistent with subacute thyroiditis.  She will not need any intervention against her thyroid at this time.  This condition is expected to result in recovery more than half of the times.  She will need repeat thyroid function test in 8 weeks with office visit.   - I did not initiate any new prescriptions today. - she is advised to maintain close follow up with Burnard Hawthorne, FNP for primary care needs.    I spent 21 minutes in the care of the patient today including review of labs from Thyroid Function, CMP, and other relevant labs ; imaging/biopsy records (current and previous including abstractions from other facilities); face-to-face time discussing  her lab results and symptoms, medications doses, her options of short and long term treatment based on the latest standards of care / guidelines;   and documenting the encounter.  Cristina Wade  participated in the discussions, expressed understanding, and  voiced agreement with the above plans.  All questions were answered to her satisfaction. she is encouraged to contact clinic should she have any questions or concerns prior to her return visit.   Follow up plan: Return in about 9 weeks (around 03/13/2022) for F/U with Pre-visit Labs.   Glade Lloyd, MD [facility] [address] [place], [place] [address] Phone: [phone]  Fax: [phone]     01/09/2022, 6:46 PM  This note was partially dictated with voice recognition software. Similar sounding words can be transcribed inadequately or may not  be corrected upon review.

## 2022-01-13 ENCOUNTER — Other Ambulatory Visit: Payer: BC Managed Care – PPO

## 2022-01-14 ENCOUNTER — Other Ambulatory Visit: Payer: Self-pay

## 2022-01-14 ENCOUNTER — Emergency Department: Payer: BC Managed Care – PPO

## 2022-01-14 DIAGNOSIS — R079 Chest pain, unspecified: Secondary | ICD-10-CM | POA: Diagnosis present

## 2022-01-14 DIAGNOSIS — R0789 Other chest pain: Secondary | ICD-10-CM | POA: Diagnosis not present

## 2022-01-14 LAB — CBC WITH DIFFERENTIAL/PLATELET
Abs Immature Granulocytes: 0.01 10*3/uL (ref 0.00–0.07)
Basophils Absolute: 0 10*3/uL (ref 0.0–0.1)
Basophils Relative: 0 %
Eosinophils Absolute: 0.1 10*3/uL (ref 0.0–0.5)
Eosinophils Relative: 1 %
HCT: 42.9 % (ref 36.0–46.0)
Hemoglobin: 14.1 g/dL (ref 12.0–15.0)
Immature Granulocytes: 0 %
Lymphocytes Relative: 30 %
Lymphs Abs: 2 10*3/uL (ref 0.7–4.0)
MCH: 29.6 pg (ref 26.0–34.0)
MCHC: 32.9 g/dL (ref 30.0–36.0)
MCV: 90.1 fL (ref 80.0–100.0)
Monocytes Absolute: 0.5 10*3/uL (ref 0.1–1.0)
Monocytes Relative: 8 %
Neutro Abs: 4.1 10*3/uL (ref 1.7–7.7)
Neutrophils Relative %: 61 %
Platelets: 207 10*3/uL (ref 150–400)
RBC: 4.76 MIL/uL (ref 3.87–5.11)
RDW: 12.4 % (ref 11.5–15.5)
WBC: 6.8 10*3/uL (ref 4.0–10.5)
nRBC: 0 % (ref 0.0–0.2)

## 2022-01-14 NOTE — ED Triage Notes (Signed)
Pt c/o intermittent CP for the last 4 days. Sts it feels like it's a sharp pain. Denies NVD or SOB. Pt appears in no obvious distress. Sts when she lays down she feels more sob.

## 2022-01-15 ENCOUNTER — Emergency Department
Admission: EM | Admit: 2022-01-15 | Discharge: 2022-01-15 | Disposition: A | Discharge status: Home / Self Care | Payer: BC Managed Care – PPO | Attending: Emergency Medicine | Admitting: Emergency Medicine

## 2022-01-15 DIAGNOSIS — R0789 Other chest pain: Secondary | ICD-10-CM

## 2022-01-15 LAB — BASIC METABOLIC PANEL
Anion gap: 5 (ref 5–15)
BUN: 16 mg/dL (ref 6–20)
CO2: 27 mmol/L (ref 22–32)
Calcium: 9.5 mg/dL (ref 8.9–10.3)
Chloride: 110 mmol/L (ref 98–111)
Creatinine, Ser: 0.61 mg/dL (ref 0.44–1.00)
GFR, Estimated: >60 mL/min (ref >60)
Glucose, Bld: 104 mg/dL — ABNORMAL HIGH (ref 70–99)
Potassium: 4.5 mmol/L (ref 3.5–5.1)
Sodium: 142 mmol/L (ref 135–145)

## 2022-01-15 LAB — TROPONIN I (HIGH SENSITIVITY)
Troponin I (High Sensitivity): 3 ng/L (ref <18)
Troponin I (High Sensitivity): 4 ng/L (ref <18)

## 2022-01-15 NOTE — ED Provider Notes (Signed)
[facility] Provider Note    Event Date/Time   First MD Initiated Contact with Patient 01/15/22 0240     (approximate)   History   Chest Pain   HPI  Cristina Wade is a 51 y.o. female who presents to the ED for evaluation of Chest Pain   I review a cardiology clinic visit from 8/25.  History of recurrent chest pains, GERD and migrainous headaches.  Has had a normal coronary CTA.  Patient presents to the ED for evaluation of about 1 week of intermittent substernal chest pain.  Denies any coexisting symptoms when the pain comes on.  Such as shortness of breath, nausea, dizziness or emesis.  No syncope.  Reports a lot of stress recently with her job.    Physical Exam   Triage Vital Signs: ED Triage Vitals  Enc Vitals Group     BP 01/14/22 2338 118/81     Pulse Rate 01/14/22 2338 66     Resp 01/14/22 2338 18     Temp 01/14/22 2338 98.5 F (36.9 C)     Temp Source 01/14/22 2338 Oral     SpO2 01/14/22 2338 100 %     Weight 01/14/22 2339 128 lb (58.1 kg)     Height 01/14/22 2339 5\' 10"  (1.778 m)     Head Circumference --      Peak Flow --      Pain Score 01/14/22 2339 3     Pain Loc --      Pain Edu? --      Excl. in [place]? --     Most recent vital signs: Vitals:   01/15/22 0121 01/15/22 0227  BP: 127/85 120/81  Pulse: 64 65  Resp: 15 16  Temp:    SpO2: 98% 97%    General: Awake, no distress.  CV:  Good peripheral perfusion.  RRR without appreciable murmur Resp:  Normal effort.  Abd:  No distention.  MSK:  No deformity noted.  Reproducible pain on palpation without overlying skin changes or signs of trauma. Neuro:  No focal deficits appreciated. Other:     ED Results / Procedures / Treatments   Labs (all labs ordered are listed, but only abnormal results are displayed) Labs Reviewed  BASIC METABOLIC PANEL - Abnormal; Notable for the following components:      Result Value   Glucose, Bld 104 (*)    All other components within normal  limits  CBC WITH DIFFERENTIAL/PLATELET  TROPONIN I (HIGH SENSITIVITY)  TROPONIN I (HIGH SENSITIVITY)    EKG Sinus rhythm with a rate of 64 bpm.  Rightward axis and normal intervals.  No clear signs of acute ischemia.  RADIOLOGY CXR interpreted by me without evidence of acute cardiopulmonary pathology.  Official radiology report(s): DG Chest 2 View  Result Date: 01/15/2022 CLINICAL DATA:  Chest pain EXAM: CHEST - 2 VIEW COMPARISON:  12/23/2021 FINDINGS: Cardiac and mediastinal contours are within normal limits. No focal pulmonary opacity. No pleural effusion or pneumothorax. No acute osseous abnormality. IMPRESSION: No acute cardiopulmonary process. Electronically Signed   By: Merilyn Baba M.D.   On: 01/15/2022 00:07    PROCEDURES and INTERVENTIONS:  .1-3 Lead EKG Interpretation  Performed by: Vladimir Crofts, MD Authorized by: Vladimir Crofts, MD     Interpretation: normal     ECG rate:  66   ECG rate assessment: normal     Rhythm: sinus rhythm     Ectopy: none     Conduction: normal  Medications - No data to display   IMPRESSION / MDM / ASSESSMENT AND PLAN / ED COURSE  I reviewed the triage vital signs and the nursing notes.  Differential diagnosis includes, but is not limited to, ACS, PTX, PNA, muscle strain/spasm, PE, dissection  {Patient presents with symptoms of an acute illness or injury that is potentially life-threatening.  51 year old female presents to the ED with atypical chest pains ultimately suitable for outpatient management with close cardiology follow-up.  Looks systemically well and has a nonischemic EKG and 2 negative troponins.  Normal CBC and metabolic panel.  Clear CXR.  Reproducible symptoms on exam.  I considered observation admission for this patient, but ultimately she is connected with cardiology and has an appointment later this month, and I think it is reasonable to follow-up then.  We discussed return precautions.      FINAL CLINICAL  IMPRESSION(S) / ED DIAGNOSES   Final diagnoses:  Other chest pain     Rx / DC Orders   ED Discharge Orders     None        Note:  This document was prepared using Dragon voice recognition software and may include unintentional dictation errors.   Delton Prairie, MD 01/15/22 [phone]

## 2022-01-15 NOTE — Discharge Instructions (Signed)
Use Tylenol for pain and fevers.  Up to 1000 mg per dose, up to 4 times per day.  Do not take more than 4000 mg of Tylenol/acetaminophen within 24 hours..  Use naproxen/Aleve for anti-inflammatory pain relief. Use up to 500mg every 12 hours. Do not take more frequently than this. Do not use other NSAIDs (ibuprofen, Advil) while taking this medication. It is safe to take Tylenol with this.   

## 2022-01-16 NOTE — Progress Notes (Signed)
01/17/22 4:12 PM   Cristina Wade [birth year] [id number]  Referring provider:  [name], FNP [address] [place],  [place] [address]  Urological history  1. High risk hematuria -non-smoker -work up 2015 -bilateral punctate stones -cysto 2019 NED -non-contrast CT 2022 - bilateral punctate stones -cysto, 2022 - NED -RUS, 10/2021 - NED -reports of gross heme -CATH UA negative for micro heme   2. Neurogenic bladder -UDS 2017 indicating nonobstructive urinary retention with poor bladder sensation and external sphincter dyssynergia -completed 12 weeks of PTNS -completed pelvic floor PT -manages with self cathing three times daily -has appointment w/ Dr. Raoul Pitch on 03/08/2022   3. Nephrolithiasis -non-contrast CT 2022 2 mm stone in the right kidney lower pole without hydronephrosis.  Three tiny stones in left kidney lower pole region without hydronephrosis   4. rUTI's -contributing factors of age, incomplete emptying, vaginal atrophy, chronic constipation,  -documented urine cultures over the last year             01/20/2021 multiple species  01/28/2021 Klebsiella pneumoniae  02/05/2021 Klebsiella pneumoniae  03/07/2021 Staphylococcus epidermidis             04/15/2021 streptococcus agalactiae               04/26/2021 Citrobacter freundii    05/03/2021 No Growth   06/10/2021 No Growth   08/27/2021 No Growth   09/15/2021 No Growth    10/06/2021 No Growth    10/21/2021-Enterococcus faecalis   11/02/2021 No Growth  Chief Complaint  Patient presents with   Dysuria     HPI: Cristina Wade is a 51 y.o.female who presents today for bad odor when she urinates and hurting.    She started having suprapubic pain on Friday.  She had dysuria and an odor to her urine yesterday.  She had two episodes of gross heme over the weekend.    Patient denies any modifying or aggravating factors.  Patient denies any flank pain.  Patient denies any fevers, chills, nausea or  vomiting.    She has an itching on her urethra w/ discharge.  She describes the discharge as white.  She states that she contacted her gynecologist, but they have not returned her phone call.   CATH UA > 30 WBC's and many bacteria.        PMH: Past Medical History:  Diagnosis Date   Anxiety    Asthma    Chronic pelvic pain in female 2018   Cystitis    Cystocele    Endometrial polyp    Endometriosis    per pt report but never seen during surg   Frequent headaches    Gastritis    GERD (gastroesophageal reflux disease)    rare   Gross hematuria    History of kidney stones    Microscopic hematuria    Migraine    Migraines    Neuropathy    Osteopenia    Urinary disorder    UTI (lower urinary tract infection)     Surgical History: Past Surgical History:  Procedure Laterality Date   COLONOSCOPY N/A 11/05/2014   Procedure: COLONOSCOPY;  Surgeon: Scot Jun, MD;  Location: [facility];  Service: Endoscopy;  Laterality: N/A;   COLONOSCOPY  11/2016   Dr. Mechele Collin   CYSTOSCOPY  2007   with biopsy    CYSTOSCOPY N/A 06/01/2016   Procedure: CYSTOSCOPY;  Surgeon: Vena Austria, MD;  Location: [facility];  Service: Gynecology;  Laterality: N/A;   DIAGNOSTIC LAPAROSCOPY  DILATATION & CURETTAGE/HYSTEROSCOPY WITH MYOSURE N/A 04/13/2015   Procedure: DILATATION & CURETTAGE/HYSTEROSCOPY WITH MYOSURE/POLYPECTOMY;  Surgeon: Elenora Fender Ward, MD;  Location: [facility];  Service: Gynecology;  Laterality: N/A;   DILATATION & CURETTAGE/HYSTEROSCOPY WITH MYOSURE N/A 06/01/2016   Procedure: DILATATION & CURETTAGE/HYSTEROSCOPY WITH MYOSURE;  Surgeon: Vena Austria, MD;  Location: [facility];  Service: Gynecology;  Laterality: N/A;   GUM SURGERY     laproscopy  2007   POLYPECTOMY     endometrial    Home Medications:  Allergies as of 01/17/2022       Reactions   Milk (cow) Other (See Comments)   Milk-related Compounds    Omeprazole Other (See Comments)   Pt reports "Chest pain, HA  and Dysuria." 01/05/22 patient states she is able to take omeprazole now without a reaction   Penicillins Itching, Rash        Medication List        Accurate as of January 17, 2022  4:12 PM. If you have any questions, ask your nurse or doctor.          Azelastine HCl 137 MCG/SPRAY Soln SMARTSIG:2 Spray(s) Both Nares Twice Daily PRN   CALCIUM 1200 PO Take 2-3 tablets by mouth daily. Reported on 05/26/2015   fluconazole 150 MG tablet Commonly known as: DIFLUCAN Take 1 tablet (150 mg total) by mouth once for 1 dose.   mometasone 50 MCG/ACT nasal spray Commonly known as: NASONEX 2 sprays daily.   MULTIVITAMIN ADULT PO Take 1 tablet by mouth daily.   naproxen 250 MG tablet Commonly known as: NAPROSYN Take 1 tablet (250 mg total) by mouth 2 (two) times daily with a meal.   nitrofurantoin (macrocrystal-monohydrate) 100 MG capsule Commonly known as: MACROBID Take 1 capsule (100 mg total) by mouth every 12 (twelve) hours.   nitroGLYCERIN 0.4 MG SL tablet Commonly known as: NITROSTAT Place 1 tablet (0.4 mg total) under the tongue every 5 (five) minutes as needed for up to 25 days for chest pain (Maximum of 3 doses.).   omeprazole 20 MG capsule Commonly known as: PRILOSEC Take 1 capsule (20 mg total) by mouth daily.   polyethylene glycol 17 g packet Commonly known as: MIRALAX / GLYCOLAX Take 17 g by mouth daily.   Povidone-Iodine 7.5 % Swab Apply 1 applicator topically as needed.   PROBIOTIC DAILY PO Take 1 tablet by mouth daily.   psyllium 0.52 g capsule Commonly known as: REGULOID Take 0.52 g by mouth daily.   ZINC OXIDE PO Take 1 tablet by mouth daily.        Allergies:  Allergies  Allergen Reactions   Milk (Cow) Other (See Comments)   Milk-Related Compounds    Omeprazole Other (See Comments)    Pt reports "Chest pain, HA and Dysuria." 01/05/22 patient states she is able to take omeprazole now without a reaction   Penicillins Itching and Rash     Family History: Family History  Problem Relation Age of Onset   Arthritis Mother    Heart disease Mother    Stroke Mother        TIA   Hypertension Mother    Diabetes Mother    Kidney cancer Mother 33   Heart failure Mother    Hyperlipidemia Mother    Colon cancer Father 71   Arthritis Father    Hyperlipidemia Father    Transient ischemic attack Father    Cancer - Colon Father 57   Prostate cancer Father 55   Valvular heart  disease Father    Cancer Maternal Grandmother    Cancer Paternal Grandfather        prostate   Breast cancer Cousin 93       has contact    Social History:  reports that she has never smoked. She has never used smokeless tobacco. She reports that she does not drink alcohol and does not use drugs.   Physical Exam: BP 105/66   Pulse 70   Ht 5\' 10"  (1.778 m)   Wt 128 lb (58.1 kg)   LMP 11/03/2021 (Exact Date)   BMI 18.37 kg/m   Constitutional:  Well nourished. Alert and oriented, No acute distress. HEENT: [place] AT, moist mucus membranes.  Trachea midline Cardiovascular: No clubbing, cyanosis, or edema. Respiratory: Normal respiratory effort, no increased work of breathing.  Neurologic: Grossly intact, no focal deficits, moving all 4 extremities. Psychiatric: Normal mood and affect.    Laboratory Data:    Latest Ref Rng & Units 01/14/2022   11:37 PM 11/30/2021    2:47 PM 11/14/2021    8:14 AM  CBC  WBC 4.0 - 10.5 K/uL 6.8  5.5  3.7   Hemoglobin 12.0 - 15.0 g/dL 14.1  12.8  13.4   Hematocrit 36.0 - 46.0 % 42.9  38.4  40.3   Platelets 150 - 400 K/uL 207  199.0  187.0      Pertinent imaging N/A  Assessment & Plan:    1. High risk hematuria -non-smoker -work up x 2 - NED -no reports of gross heme -CATH UA negative for microscopic hematuria  2. Neurogenic bladder  - Currently treating wit CIC x 3 daily  - Per patient she was seen by dr Matilde Sprang for possible  interstim and was told she was not a candidate  - She is very anxious and  perseverates over poor bladder sensation and inability to void on her own  - Will get a second opinion with Dr. Malena Edman at [facility]  3. Suspected UTI -CATH UA with pyuria and bacteruria  -urine sent for culture -started on Macrobid 100 mg, twice daily for seven days   Royden Purl   [facility] [address] [place], [place] [address] [phone]

## 2022-01-16 NOTE — Telephone Encounter (Signed)
Appointment scheduled.

## 2022-01-17 ENCOUNTER — Ambulatory Visit (INDEPENDENT_AMBULATORY_CARE_PROVIDER_SITE_OTHER): Payer: BC Managed Care – PPO | Admitting: Urology

## 2022-01-17 ENCOUNTER — Ambulatory Visit: Payer: BC Managed Care – PPO | Admitting: Physician Assistant

## 2022-01-17 ENCOUNTER — Encounter: Payer: Self-pay | Admitting: Urology

## 2022-01-17 VITALS — BP 105/66 | HR 70 | Ht 70.0 in | Wt 128.0 lb

## 2022-01-17 DIAGNOSIS — R300 Dysuria: Secondary | ICD-10-CM

## 2022-01-17 DIAGNOSIS — R319 Hematuria, unspecified: Secondary | ICD-10-CM

## 2022-01-17 DIAGNOSIS — Z87440 Personal history of urinary (tract) infections: Secondary | ICD-10-CM

## 2022-01-17 DIAGNOSIS — N390 Urinary tract infection, site not specified: Secondary | ICD-10-CM

## 2022-01-17 DIAGNOSIS — R8281 Pyuria: Secondary | ICD-10-CM | POA: Diagnosis not present

## 2022-01-17 DIAGNOSIS — R339 Retention of urine, unspecified: Secondary | ICD-10-CM | POA: Diagnosis not present

## 2022-01-17 DIAGNOSIS — R8271 Bacteriuria: Secondary | ICD-10-CM

## 2022-01-17 DIAGNOSIS — N319 Neuromuscular dysfunction of bladder, unspecified: Secondary | ICD-10-CM

## 2022-01-17 DIAGNOSIS — R82998 Other abnormal findings in urine: Secondary | ICD-10-CM

## 2022-01-17 LAB — URINALYSIS, COMPLETE
Bilirubin, UA: NEGATIVE
Glucose, UA: NEGATIVE
Ketones, UA: NEGATIVE
Nitrite, UA: NEGATIVE
Protein,UA: NEGATIVE
Specific Gravity, UA: 1.01 (ref 1.005–1.030)
Urobilinogen, Ur: 0.2 mg/dL (ref 0.2–1.0)
pH, UA: 6.5 (ref 5.0–7.5)

## 2022-01-17 LAB — MICROSCOPIC EXAMINATION: WBC, UA: >30 /hpf — AB (ref 0–5)

## 2022-01-17 MED ORDER — FLUCONAZOLE 150 MG PO TABS: 150.0000 mg | ORAL_TABLET | Freq: Once | ORAL | 0 refills | Status: AC

## 2022-01-17 MED ORDER — NITROFURANTOIN MONOHYD MACRO 100 MG PO CAPS: 100.0000 mg | ORAL_CAPSULE | Freq: Two times a day (BID) | ORAL | 0 refills | Status: DC

## 2022-01-17 NOTE — Patient Instructions (Signed)
Cranberry tablets, Vitamin C, D-mannose and probiotics

## 2022-01-18 ENCOUNTER — Ambulatory Visit: Payer: BC Managed Care – PPO | Admitting: "Endocrinology

## 2022-01-20 ENCOUNTER — Telehealth: Payer: Self-pay | Admitting: *Deleted

## 2022-01-20 ENCOUNTER — Encounter: Payer: Self-pay | Admitting: *Deleted

## 2022-01-20 ENCOUNTER — Other Ambulatory Visit: Payer: Self-pay | Admitting: Urology

## 2022-01-20 LAB — CULTURE, URINE COMPREHENSIVE

## 2022-01-20 MED ORDER — CIPROFLOXACIN HCL 500 MG PO TABS: 500.0000 mg | ORAL_TABLET | Freq: Two times a day (BID) | ORAL | 0 refills | Status: DC

## 2022-01-20 NOTE — Telephone Encounter (Signed)
-----   Message from Nori Riis, PA-C sent at 01/20/2022  9:43 AM EDT ----- Please let Mrs. Davoli know that her urine culture was positive for infection and that Macrobid is the correct antibiotic.

## 2022-01-20 NOTE — Telephone Encounter (Signed)
Spoke with patient and advised that Cipro called in.

## 2022-01-20 NOTE — Telephone Encounter (Signed)
Sent mychart message

## 2022-01-20 NOTE — Telephone Encounter (Signed)
Pt calling asking for a stronger abx, I spoke with patient and advised that the macrobid is the correct abx, pt states she is normally on Cipro.

## 2022-02-02 ENCOUNTER — Encounter: Payer: Self-pay | Admitting: Internal Medicine

## 2022-02-02 ENCOUNTER — Ambulatory Visit: Payer: BC Managed Care – PPO | Attending: Internal Medicine | Admitting: Internal Medicine

## 2022-02-02 VITALS — BP 108/70 | HR 64 | Ht 70.0 in | Wt 129.6 lb

## 2022-02-02 DIAGNOSIS — E0590 Thyrotoxicosis, unspecified without thyrotoxic crisis or storm: Secondary | ICD-10-CM

## 2022-02-02 DIAGNOSIS — I340 Nonrheumatic mitral (valve) insufficiency: Secondary | ICD-10-CM

## 2022-02-02 DIAGNOSIS — G8929 Other chronic pain: Secondary | ICD-10-CM | POA: Diagnosis not present

## 2022-02-02 DIAGNOSIS — R079 Chest pain, unspecified: Secondary | ICD-10-CM

## 2022-02-02 DIAGNOSIS — I4510 Unspecified right bundle-branch block: Secondary | ICD-10-CM

## 2022-02-02 DIAGNOSIS — R0602 Shortness of breath: Secondary | ICD-10-CM

## 2022-02-02 MED ORDER — RANOLAZINE ER 500 MG PO TB12: 500.0000 mg | ORAL_TABLET | Freq: Two times a day (BID) | ORAL | 0 refills | Status: DC

## 2022-02-02 NOTE — Progress Notes (Signed)
Follow-up Outpatient Visit Date: 02/02/2022  Primary Care Provider: [name], FNP [address] [place] [address]  Chief Complaint: Chest pain  HPI:  Ms. Zieske is a 51 y.o. female with history of recurrent chest pain with normal coronary CTA, asthma, GERD, and migraine headaches, who presents for follow-up of chest pain.  I last saw her in late August, at which time she complained of frequent chest pain as well as upper abdominal pain and nausea.  I recommended trials of omeprazole and naproxen.  We also arranged for an abdominal ultrasound.  This was unremarkable without evidence of cholelithiasis or acute cholecystitis.  She presented to the ED about 3 weeks ago with recurrent chest pain.  ED work-up was unremarkable.  Today, Ms. Stillion continues to experience frequent chest pain.  It is in the center and left upper portions of her chest and is described as a tightness that worsens when she "pushes" herself.  It also happens at rest and improved a little with naproxen.  Ms. Kitch never started the omeprazole that was recommended at our last visit.  She also endorses mild shortness of breath with certain activities and when lying down.  She denies edema.  She reports occasional palpitations with associated headaches.  She has not felt lightheaded/dizzy.  She is concerned about prior finding of incomplete RBBB on EKG's.  --------------------------------------------------------------------------------------------------  Cardiovascular History & Procedures: Cardiovascular Problems: Chest pain Palpitations   Risk Factors: None   Cath/PCI: None   CV Surgery: None   EP Procedures and Devices: None   Non-Invasive Evaluation(s): Carotid Doppler (04/14/2021):No significant stenosis in either carotid artery. Cardiac CTA (12/18/2019): Coronary artery disease.  Coronary calcium score 0.  No incidental findings. TTE (10/31/19): Normal LV size.  LVEF 50-55% with normal  wall motion and diastolic function.  Normal RV size and function.  Mild mitral regurgitation.  CVP ~15 mmHg. Exercise tolerance test (10/13/19): Low risk study without evidence of ischemia ([facility] Treadmill Score = +5).  Rare isolated PVC's noted during recovery.  Recent CV Pertinent Labs: Lab Results  Component Value Date   CHOL 157 11/14/2021   HDL 61.70 11/14/2021   LDLCALC 86 11/14/2021   TRIG 48.0 11/14/2021   CHOLHDL 3 11/14/2021   K 4.5 01/14/2022   MG 2.2 12/03/2020   BUN 16 01/14/2022   CREATININE 0.61 01/14/2022    Past medical and surgical history were reviewed and updated in EPIC.  Current Meds  Medication Sig   Azelastine HCl 137 MCG/SPRAY SOLN SMARTSIG:2 Spray(s) Both Nares Twice Daily PRN   Calcium Carbonate-Vit D-Min (CALCIUM 1200 PO) Take 2-3 tablets by mouth daily. Reported on 05/26/2015   mometasone (NASONEX) 50 MCG/ACT nasal spray 2 sprays daily.   Multiple Vitamin (MULTIVITAMIN ADULT PO) Take 1 tablet by mouth daily.   naproxen (NAPROSYN) 250 MG tablet Take 1 tablet (250 mg total) by mouth 2 (two) times daily with a meal.   nitroGLYCERIN (NITROSTAT) 0.4 MG SL tablet Place 1 tablet (0.4 mg total) under the tongue every 5 (five) minutes as needed for up to 25 days for chest pain (Maximum of 3 doses.).   omeprazole (PRILOSEC) 20 MG capsule Take 1 capsule (20 mg total) by mouth daily.   polyethylene glycol (MIRALAX / GLYCOLAX) 17 g packet Take 17 g by mouth daily.   Povidone-Iodine 7.5 % SWAB Apply 1 applicator topically as needed.   Probiotic Product (PROBIOTIC DAILY PO) Take 1 tablet by mouth daily.   psyllium (REGULOID) 0.52  g capsule Take 0.52 g by mouth daily.   ZINC OXIDE PO Take 1 tablet by mouth daily.   Current Facility-Administered Medications for the 02/02/22 encounter (Office Visit) with Teleah Villamar, Harrell Gave, MD  Medication   lidocaine (XYLOCAINE) 2 % jelly 1 application    Allergies: Milk (cow), Milk-related compounds, Omeprazole, and Penicillins  Social  History   Tobacco Use   Smoking status: Never   Smokeless tobacco: Never  Vaping Use   Vaping Use: Never used  Substance Use Topics   Alcohol use: No   Drug use: No    Family History  Problem Relation Age of Onset   Arthritis Mother    Heart disease Mother    Stroke Mother        TIA   Hypertension Mother    Diabetes Mother    Kidney cancer Mother 13   Heart failure Mother    Hyperlipidemia Mother    Colon cancer Father 74   Arthritis Father    Hyperlipidemia Father    Transient ischemic attack Father    Cancer - Colon Father 9   Prostate cancer Father 84   Valvular heart disease Father    Cancer Maternal Grandmother    Cancer Paternal Grandfather        prostate   Breast cancer Cousin 82       has contact    Review of Systems: A 12-system review of systems was performed and was negative except as noted in the HPI.  --------------------------------------------------------------------------------------------------  Physical Exam: BP 108/70 (BP Location: Left Arm, Patient Position: Sitting, Cuff Size: Normal)   Pulse 64   Ht 5\' 10"  (1.778 m)   Wt 129 lb 9.6 oz (58.8 kg)   LMP 11/03/2021 (Exact Date)   SpO2 98%   BMI 18.60 kg/m   General:  NAD. Neck: No JVD or HJR. Lungs: Clear to auscultation bilaterally without wheezes or crackles. Heart: Regular rate and rhythm without murmurs, rubs, or gallops. Abdomen: Soft, nontender, nondistended. Extremities: No lower extremity edema.  EKG:  Normal sinus rhythm with rightward axis and incomplete RBBB.  No significant change since prior tracing on 01/14/2022.  Lab Results  Component Value Date   WBC 6.8 01/14/2022   HGB 14.1 01/14/2022   HCT 42.9 01/14/2022   MCV 90.1 01/14/2022   PLT 207 01/14/2022    Lab Results  Component Value Date   NA 142 01/14/2022   K 4.5 01/14/2022   CL 110 01/14/2022   CO2 27 01/14/2022   BUN 16 01/14/2022   CREATININE 0.61 01/14/2022   GLUCOSE 104 (H) 01/14/2022   ALT 34  11/14/2021    Lab Results  Component Value Date   CHOL 157 11/14/2021   HDL 61.70 11/14/2021   LDLCALC 86 11/14/2021   TRIG 48.0 11/14/2021   CHOLHDL 3 11/14/2021    --------------------------------------------------------------------------------------------------  ASSESSMENT AND PLAN: Chronic chest pain: Pain has some typical features, including tightness quality and exacerbation with exertion.  However, prior coronary CTA was normal without any stenosis.  She also has no cardiac risk factors.  Time course would be unusual for vasospasm as well.  Microvascular dysfunction is a possibility.  We have therefore agreed to an empiric trial ranolazine 500 mg twice daily, as her borderline low BP make beta-blockers and calcium channel blockers suboptimal.  She can continue to use NSAIDs on a PRN basis.  I encouraged her to begin taking omeprazole as well.  We discussed performing cardiac catheterization as gold standard for evaluation of  ischemic heart disease but have agreed to defer this given low pretest probability of significant CAD.  At this time, benefits of catheterization to not outweigh risks.  If ranolazine does not provide any relieve, I would recommend exploration of noncardiac causes of Ms. Maese's chest pain.  Shortness of breath, mitral regurgitation, and incomplete right bundle branch block: Dyspnea is sporadic with a component of orthopnea noted.  Ms. Kintzel appears euvolemic on exam.  I do not appreciate any murmurs on exam.  Prior echo was notable for mild MR but otherwise no significant structural abnormality.  In the setting of rightward axis and incomplete RBBB, a secundum ASD is within the differential, though I think this would be unusual given her age and prior echo and coronary CTA without any evidence of this or right-sided cardiac enlargement.  Nonetheless, we have agreed to repeat and echo with bubble study.  Hyperthyroidism: Ongoing management per Ms. Arnett.  Follow-up:  Return to clinic in 1 month.  Yvonne Kendall, MD 02/02/2022 4:17 PM

## 2022-02-02 NOTE — Patient Instructions (Signed)
Medication Instructions:   -START Ranexa ( Ranolazine)  500 mg by mouth twice daily.  *If you need a refill on your cardiac medications before your next appointment, please call your pharmacy*   Lab Work:  NONE  If you have labs (blood work) drawn today and your tests are completely normal, you will receive your results only by: Roff (if you have MyChart) OR A paper copy in the mail If you have any lab test that is abnormal or we need to change your treatment, we will call you to review the results.   Testing/Procedures:  Your physician has requested that you have an echocardiogram. Echocardiography is a painless test that uses sound waves to create images of your heart. It provides your doctor with information about the size and shape of your heart and how well your heart's chambers and valves are working. This procedure takes approximately one hour. There are no restrictions for this procedure.  Please do NOT wear cologne, perfume, aftershave, or lotions (deodorant is allowed). Please arrive 15 minutes prior to your appointment time.   Follow-Up: At [facility], you and your health needs are our priority.  As part of our continuing mission to provide you with exceptional heart care, we have created designated Provider Care Teams.  These Care Teams include your primary Cardiologist (physician) and Advanced Practice Providers (APPs -  Physician Assistants and Nurse Practitioners) who all work together to provide you with the care you need, when you need it.  We recommend signing up for the patient portal called "MyChart".  Sign up information is provided on this After Visit Summary.  MyChart is used to connect with patients for Virtual Visits (Telemedicine).  Patients are able to view lab/test results, encounter notes, upcoming appointments, etc.  Non-urgent messages can be sent to your provider as well.   To learn more about what you can do with MyChart, go to  [email or web address].    Your next appointment:   1 MONTH FOLLOW UP   The format for your next appointment:   In Person  Provider:   You may see Nelva Bush, MD or one of the following Advanced Practice Providers on your designated Care Team:   Murray Hodgkins, NP Christell Faith, PA-C Cadence Kathlen Mody, PA-C Gerrie Nordmann, NP  Important Information About Sugar

## 2022-02-03 ENCOUNTER — Encounter: Payer: Self-pay | Admitting: Internal Medicine

## 2022-02-08 ENCOUNTER — Telehealth: Payer: Self-pay

## 2022-02-08 NOTE — Telephone Encounter (Signed)
Patient states she is experiencing a sore throat, laryngitis, cough, sinus and chest congestion, chest and head hurts.  Negative for covid last week.  Symptoms started almost one week ago.  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  Patient would like to be seen after 3pm.  I offered to check our [facility] and [place] locations.  I offered patient a virtual appointment at 4pm tomorrow with Dr. Silvio Pate and she declined.  Patient states she will call back.

## 2022-02-09 ENCOUNTER — Ambulatory Visit: Payer: BC Managed Care – PPO | Admitting: Internal Medicine

## 2022-02-09 NOTE — Telephone Encounter (Signed)
Spoke to pt and she stated that she has appt on 02/10/22 with ENT and she will let us know what they determined. Pt stated that she does not need an appt her in our office

## 2022-02-13 ENCOUNTER — Encounter (INDEPENDENT_AMBULATORY_CARE_PROVIDER_SITE_OTHER): Payer: Self-pay

## 2022-02-21 ENCOUNTER — Ambulatory Visit: Payer: BC Managed Care – PPO | Admitting: Urology

## 2022-02-23 ENCOUNTER — Ambulatory Visit: Payer: BC Managed Care – PPO | Attending: Internal Medicine

## 2022-02-23 ENCOUNTER — Telehealth: Payer: Self-pay | Admitting: Family

## 2022-02-23 DIAGNOSIS — I340 Nonrheumatic mitral (valve) insufficiency: Secondary | ICD-10-CM

## 2022-02-23 DIAGNOSIS — R0602 Shortness of breath: Secondary | ICD-10-CM

## 2022-02-23 LAB — ECHOCARDIOGRAM COMPLETE BUBBLE STUDY
AR max vel: 2.18 cm2
AV Area VTI: 2.4 cm2
AV Area mean vel: 2.1 cm2
AV Mean grad: 3 mmHg
AV Peak grad: 6.4 mmHg
Ao pk vel: 1.26 m/s
Area-P 1/2: 3.7 cm2
Calc EF: 58.3 %
S' Lateral: 2.9 cm
Single Plane A2C EF: 59.5 %
Single Plane A4C EF: 58.5 %

## 2022-02-23 NOTE — Telephone Encounter (Signed)
Patient has a lab appt 03/01/2022, there are no orders in. 

## 2022-02-24 ENCOUNTER — Other Ambulatory Visit: Payer: Self-pay

## 2022-02-24 DIAGNOSIS — E559 Vitamin D deficiency, unspecified: Secondary | ICD-10-CM

## 2022-02-24 NOTE — Telephone Encounter (Signed)
DONE lab ordered

## 2022-03-01 ENCOUNTER — Other Ambulatory Visit
Admission: RE | Admit: 2022-03-01 | Discharge: 2022-03-01 | Disposition: A | Discharge status: Home / Self Care | Payer: BC Managed Care – PPO | Attending: "Endocrinology | Admitting: "Endocrinology

## 2022-03-01 ENCOUNTER — Other Ambulatory Visit (INDEPENDENT_AMBULATORY_CARE_PROVIDER_SITE_OTHER): Payer: BC Managed Care – PPO

## 2022-03-01 DIAGNOSIS — E061 Subacute thyroiditis: Secondary | ICD-10-CM | POA: Insufficient documentation

## 2022-03-01 DIAGNOSIS — E559 Vitamin D deficiency, unspecified: Secondary | ICD-10-CM

## 2022-03-01 LAB — TSH: TSH: 1.961 u[IU]/mL (ref 0.350–4.500)

## 2022-03-01 LAB — T4, FREE: Free T4: 0.9 ng/dL (ref 0.61–1.12)

## 2022-03-02 LAB — T3, FREE: T3, Free: 2.6 pg/mL (ref 2.0–4.4)

## 2022-03-02 LAB — THYROGLOBULIN ANTIBODY: Thyroglobulin Antibody: 1.3 IU/mL — ABNORMAL HIGH (ref 0.0–0.9)

## 2022-03-02 LAB — THYROID PEROXIDASE ANTIBODY: Thyroperoxidase Ab SerPl-aCnc: 59 IU/mL — ABNORMAL HIGH (ref 0–34)

## 2022-03-02 LAB — VITAMIN D 25 HYDROXY (VIT D DEFICIENCY, FRACTURES): VITD: 37.53 ng/mL (ref 30.00–100.00)

## 2022-03-06 ENCOUNTER — Ambulatory Visit: Payer: BC Managed Care – PPO | Attending: Cardiology | Admitting: Cardiology

## 2022-03-06 ENCOUNTER — Encounter: Payer: Self-pay | Admitting: Cardiology

## 2022-03-06 VITALS — BP 102/76 | HR 83 | Ht 69.0 in | Wt 130.0 lb

## 2022-03-06 DIAGNOSIS — G8929 Other chronic pain: Secondary | ICD-10-CM | POA: Diagnosis not present

## 2022-03-06 DIAGNOSIS — R079 Chest pain, unspecified: Secondary | ICD-10-CM | POA: Diagnosis not present

## 2022-03-06 DIAGNOSIS — E0590 Thyrotoxicosis, unspecified without thyrotoxic crisis or storm: Secondary | ICD-10-CM

## 2022-03-06 NOTE — Patient Instructions (Signed)
Medication Instructions:  No changes at this time.   *If you need a refill on your cardiac medications before your next appointment, please call your pharmacy*   Lab Work: None  If you have labs (blood work) drawn today and your tests are completely normal, you will receive your results only by: MyChart Message (if you have MyChart) OR A paper copy in the mail If you have any lab test that is abnormal or we need to change your treatment, we will call you to review the results.   Testing/Procedures: None   Follow-Up: At [facility], you and your health needs are our priority.  As part of our continuing mission to provide you with exceptional heart care, we have created designated Provider Care Teams.  These Care Teams include your primary Cardiologist (physician) and Advanced Practice Providers (APPs -  Physician Assistants and Nurse Practitioners) who all work together to provide you with the care you need, when you need it.   Your next appointment:   1 year(s)  The format for your next appointment:   In Person  Provider:   Yvonne Kendall, MD     Important Information About Sugar

## 2022-03-06 NOTE — Progress Notes (Signed)
Cardiology Clinic Note   Patient Name: Cristina Wade Date of Encounter: 03/06/2022  Primary Care Provider:  [name], FNP Primary Cardiologist:  Yvonne Kendall, MD  Patient Profile    51 year old with a history of recurrent chest pain with normal coronary CTA, asthma, gastroesophageal reflux disease and migraine headaches, who presents today for follow-up of chest pain.  Past Medical History    Past Medical History:  Diagnosis Date   Anxiety    Asthma    Chronic pelvic pain in female 2018   Cystitis    Cystocele    Endometrial polyp    Endometriosis    per pt report but never seen during surg   Frequent headaches    Gastritis    GERD (gastroesophageal reflux disease)    rare   Gross hematuria    History of kidney stones    Microscopic hematuria    Migraine    Migraines    Neuropathy    Osteopenia    Urinary disorder    UTI (lower urinary tract infection)    Past Surgical History:  Procedure Laterality Date   COLONOSCOPY N/A 11/05/2014   Procedure: COLONOSCOPY;  Surgeon: Scot Jun, MD;  Location: [facility] ENDOSCOPY;  Service: Endoscopy;  Laterality: N/A;   COLONOSCOPY  11/2016   Dr. Mechele Collin   CYSTOSCOPY  2007   with biopsy    CYSTOSCOPY N/A 06/01/2016   Procedure: CYSTOSCOPY;  Surgeon: Vena Austria, MD;  Location: [facility] ORS;  Service: Gynecology;  Laterality: N/A;   DIAGNOSTIC LAPAROSCOPY     DILATATION & CURETTAGE/HYSTEROSCOPY WITH MYOSURE N/A 04/13/2015   Procedure: DILATATION & CURETTAGE/HYSTEROSCOPY WITH MYOSURE/POLYPECTOMY;  Surgeon: Elenora Fender Ward, MD;  Location: [facility] ORS;  Service: Gynecology;  Laterality: N/A;   DILATATION & CURETTAGE/HYSTEROSCOPY WITH MYOSURE N/A 06/01/2016   Procedure: DILATATION & CURETTAGE/HYSTEROSCOPY WITH MYOSURE;  Surgeon: Vena Austria, MD;  Location: [facility] ORS;  Service: Gynecology;  Laterality: N/A;   GUM SURGERY     laproscopy  2007   POLYPECTOMY     endometrial    Allergies  Allergies  Allergen  Reactions   Milk (Cow) Other (See Comments)   Milk-Related Compounds    Omeprazole Other (See Comments)    Pt reports "Chest pain, HA and Dysuria." 01/05/22 patient states she is able to take omeprazole now without a reaction   Penicillins Itching and Rash    History of Present Illness    Cristina Wade is a 51 year old female with the previously mentioned past medical history of recurrent chest pain with normal coronary CTA, asthma, gastroesophageal reflux disease and migraine headaches.  In August she had complaints of frequent chest pain as well as upper abdominal pain with nausea and was started on trials of omeprazole and naproxen.  She also underwent an abdominal ultrasound which was unremarkable without evidence of acute cholecystitis.  Exercise tolerance test on 10/13/2019 revealed low risk study without evidence of ischemia.  Rare isolated PVCs noted during recovery.  Echocardiogram done 10/31/2019 revealed normal LV size, LVEF of 50-55%, no regional wall motion abnormalities, mild mitral regurgitation  Coronary CTA done on/2/21 revealed coronary calcium score of 0 with no incidental findings.  Carotid Doppler was done 04/14/2021 with no significant stenosis in either carotid artery.  She presented to the [facility] emergency department on 01/15/2022 with complaints of chest pain about 1 week of intermittent substernal chest discomfort.  She denied any associated symptoms such as shortness of breath, nausea, dizziness, or vomiting.  She reported an  increased amount of stress at her work.  Vitals revealed blood pressure of 118/81, pulse of 66, respirations of 18, temp 98.5, blood glucose of 104, high-sensitivity troponin of 3 and 4, no EKG changes concerning for ischemia.  Emergency department work-up was unremarkable.  She was subsequently discharged home.  She was last seen in clinic 02/02/2022 by Dr. Saunders Revel and continued to experience frequent chest pain.  She also endorsed mild shortness of  breath with certain activities and when lying down.  Cardiac catheterization was deferred but she was ordered an echocardiogram with a bubble study.  Echocardiogram completed 02/23/2022 revealed LVEF of 60 to 65%, no regional wall motion abnormalities, G2 DD, mild mitral regurgitation, bubble study portion of the test was negative with no evidence of any intra atrial shunting.  She returns to clinic today stating that she has been doing fairly well taking the Ranexa.  She states that her chest pain is improving.  She is accompanied by her sister today.  She has a large quantity of questions want to have much activity she should be doing as far as pushing herself at work, if stress and increased activity can cause chest discomfort, if stiffening of her heart muscle causes chest discomfort, but overall states that she has been doing fairly well.  She denies any recent hospitalizations or visits to the emergency department and thinks that since her symptoms have improved that she should just come to follow-up on a as needed basis.  Home Medications    Current Outpatient Medications  Medication Sig Dispense Refill   Azelastine HCl 137 MCG/SPRAY SOLN SMARTSIG:2 Spray(s) Both Nares Twice Daily PRN     Calcium Carbonate-Vit D-Min (CALCIUM 1200 PO) Take 2-3 tablets by mouth daily. Reported on 05/26/2015     mometasone (NASONEX) 50 MCG/ACT nasal spray 2 sprays daily.     Multiple Vitamin (MULTIVITAMIN ADULT PO) Take 1 tablet by mouth daily.     naproxen (NAPROSYN) 250 MG tablet Take 1 tablet (250 mg total) by mouth 2 (two) times daily with a meal.     polyethylene glycol (MIRALAX / GLYCOLAX) 17 g packet Take 17 g by mouth daily.     Povidone-Iodine 7.5 % SWAB Apply 1 applicator topically as needed. 50 each 3   Probiotic Product (PROBIOTIC DAILY PO) Take 1 tablet by mouth daily.     psyllium (REGULOID) 0.52 g capsule Take 0.52 g by mouth daily.     ranolazine (RANEXA) 500 MG 12 hr tablet Take 1 tablet (500 mg  total) by mouth 2 (two) times daily. 60 tablet 0   ZINC OXIDE PO Take 1 tablet by mouth daily.     escitalopram (LEXAPRO) 10 MG tablet Take 10 mg by mouth daily. (Patient not taking: Reported on 03/06/2022)     nitroGLYCERIN (NITROSTAT) 0.4 MG SL tablet Place 1 tablet (0.4 mg total) under the tongue every 5 (five) minutes as needed for up to 25 days for chest pain (Maximum of 3 doses.). (Patient not taking: Reported on 03/06/2022) 25 tablet 0   omeprazole (PRILOSEC) 20 MG capsule Take 1 capsule (20 mg total) by mouth daily. (Patient not taking: Reported on 03/06/2022) 30 capsule 1   Current Facility-Administered Medications  Medication Dose Route Frequency Provider Last Rate Last Admin   lidocaine (XYLOCAINE) 2 % jelly 1 application  1 application  Urethral Once Billey Co, MD         Family History    Family History  Problem Relation Age of Onset  Arthritis Mother    Heart disease Mother    Stroke Mother        TIA   Hypertension Mother    Diabetes Mother    Kidney cancer Mother 59   Heart failure Mother    Hyperlipidemia Mother    Colon cancer Father 43   Arthritis Father    Hyperlipidemia Father    Transient ischemic attack Father    Cancer - Colon Father 59   Prostate cancer Father 61   Valvular heart disease Father    Cancer Maternal Grandmother    Cancer Paternal Grandfather        prostate   Breast cancer Cousin 38       has contact   She indicated that her mother is deceased. She indicated that her father is deceased. She indicated that her sister is alive. She indicated that her brother is alive. She indicated that her maternal grandmother is deceased. She indicated that her paternal grandfather is deceased. She indicated that her cousin is alive.  Social History    Social History   Socioeconomic History   Marital status: Single    Spouse name: Not on file   Number of children: Not on file   Years of education: Not on file   Highest education level: Not  on file  Occupational History   Not on file  Tobacco Use   Smoking status: Never   Smokeless tobacco: Never  Vaping Use   Vaping Use: Never used  Substance and Sexual Activity   Alcohol use: No   Drug use: No   Sexual activity: Not on file  Other Topics Concern   Not on file  Social History Narrative   Lives with twin sister   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    Social Determinants of Health   Financial Resource Strain: Not on file  Food Insecurity: Not on file  Transportation Needs: Not on file  Physical Activity: Not on file  Stress: Not on file  Social Connections: Not on file  Intimate Partner Violence: Not on file     Review of Systems    General:  No chills, fever, night sweats or weight changes.  Cardiovascular: Endorses occasional chest pain, dyspnea on exertion, but denies edema, orthopnea, palpitations, paroxysmal nocturnal dyspnea. Dermatological: No rash, lesions/masses Respiratory: No cough, dyspnea Urologic: No hematuria, dysuria Abdominal:   No nausea, vomiting, diarrhea, bright red blood per rectum, melena, or hematemesis Neurologic:  No visual changes, wkns, changes in mental status. All other systems reviewed and are otherwise negative except as noted above.   Physical Exam    VS:  BP 102/76 (BP Location: Left Arm, Patient Position: Sitting, Cuff Size: Normal)   Pulse 83   Ht 5\' 9"  (1.753 m)   Wt 130 lb (59 kg)   LMP 11/03/2021 (Exact Date)   SpO2 98%   BMI 19.20 kg/m  , BMI Body mass index is 19.2 kg/m.     GEN: Well nourished, well developed, in no acute distress. HEENT: normal. Neck: Supple, no JVD, carotid bruits, or masses. Cardiac: RRR, no murmurs, rubs, or gallops. No clubbing, cyanosis, edema.  Radials/DP/PT 2+ and equal bilaterally.  Respiratory:  Respirations regular and  unlabored, clear to auscultation bilaterally. GI: Soft, nontender, nondistended, BS + x  4. MS: no deformity or atrophy. Skin: warm and dry, no rash. Neuro:  Strength and sensation are intact. Psych: Normal affect.  Accessory Clinical Findings    ECG personally reviewed by me today-no new tracings were completed today  Lab Results  Component Value Date   WBC 6.8 01/14/2022   HGB 14.1 01/14/2022   HCT 42.9 01/14/2022   MCV 90.1 01/14/2022   PLT 207 01/14/2022   Lab Results  Component Value Date   CREATININE 0.61 01/14/2022   BUN 16 01/14/2022   NA 142 01/14/2022   K 4.5 01/14/2022   CL 110 01/14/2022   CO2 27 01/14/2022   Lab Results  Component Value Date   ALT 34 11/14/2021   AST 17 11/14/2021   ALKPHOS 59 11/14/2021   BILITOT 0.9 11/14/2021   Lab Results  Component Value Date   CHOL 157 11/14/2021   HDL 61.70 11/14/2021   LDLCALC 86 11/14/2021   TRIG 48.0 11/14/2021   CHOLHDL 3 11/14/2021    Lab Results  Component Value Date   HGBA1C 5.7 11/14/2021    Assessment & Plan   1.  Chronic chest pain that has improved.  It includes tightness and exacerbation with exertion overworking herself and likely stress related to work.  She continues to work in [employer] at one of the [employer].  She had a prior coronary CTA with normal without stenosis.  She also has no cardiac risk factors.  She was recently placed on Ranexa 500 mg twice daily due to borderline low blood pressure.  Blood pressure is continued to stay a little over [id number] systolic which is fine.  She is also been advised to use NSAIDs on a as needed basis or Tylenol.  We also discussed using warm moist heat for muscular discomfort.  She also stated that she does have issues with acid reflux and only takes medication on an as-needed basis.  She was advised that she continues to have some of these symptoms that she should consider taking PPI on a daily basis.  2.  Hyperthyroidism with ongoing management with her  PCP.  3.  Disposition she will return to clinic to see MD/APP in 1 year or sooner if needed with EKG on return.  Jeree Delcid, NP 03/06/2022, 4:11 PM

## 2022-03-08 ENCOUNTER — Ambulatory Visit: Payer: BC Managed Care – PPO | Admitting: "Endocrinology

## 2022-03-08 ENCOUNTER — Other Ambulatory Visit: Payer: BC Managed Care – PPO

## 2022-03-08 ENCOUNTER — Encounter: Payer: Self-pay | Admitting: "Endocrinology

## 2022-03-08 ENCOUNTER — Ambulatory Visit (INDEPENDENT_AMBULATORY_CARE_PROVIDER_SITE_OTHER): Payer: BC Managed Care – PPO | Admitting: "Endocrinology

## 2022-03-08 VITALS — BP 104/72 | HR 68 | Ht 69.0 in | Wt 126.2 lb

## 2022-03-08 DIAGNOSIS — R768 Other specified abnormal immunological findings in serum: Secondary | ICD-10-CM | POA: Diagnosis not present

## 2022-03-08 DIAGNOSIS — E061 Subacute thyroiditis: Secondary | ICD-10-CM | POA: Diagnosis not present

## 2022-03-08 NOTE — Progress Notes (Signed)
03/08/2022, 6:24 PM   Endocrinology follow-up note  Subjective:    Patient ID: Cristina Wade, female    DOB: Nov 28, 1970, PCP Allegra GranaArnett, Margaret G, FNP   Past Medical History:  Diagnosis Date   Anxiety    Asthma    Chronic pelvic pain in female 2018   Cystitis    Cystocele    Endometrial polyp    Endometriosis    per pt report but never seen during surg   Frequent headaches    Gastritis    GERD (gastroesophageal reflux disease)    rare   Gross hematuria    History of kidney stones    Microscopic hematuria    Migraine    Migraines    Neuropathy    Osteopenia    Urinary disorder    UTI (lower urinary tract infection)    Past Surgical History:  Procedure Laterality Date   COLONOSCOPY N/A 11/05/2014   Procedure: COLONOSCOPY;  Surgeon: Scot Junobert T Elliott, MD;  Location: [facility] END[facility];  Service: Endoscopy;  Laterality: N/A;   COLONOSCOPY  11/2016   Dr. Mechele CollinElliott   CYSTOSCOPY  2007   with biopsy    CYSTOSCOPY N/A 06/01/2016   Procedure: CYSTOSCOPY;  Surgeon: Vena AustriaAndreas Staebler, MD;  Location: [facility] ORS;  Service: Gynecology;  Laterality: N/A;   DIAGNOSTIC LAPAROSCOPY     DILATATION & CURETTAGE/HYSTEROSCOPY WITH MYOSURE N/A 04/13/2015   Procedure: DILATATION & CURETTAGE/HYSTEROSCOPY WITH MYOSURE/POLYPECTOMY;  Surgeon: Elenora Fenderhelsea C Ward, MD;  Location: [facility] ORS;  Service: Gynecology;  Laterality: N/A;   DILATATION & CURETTAGE/HYSTEROSCOPY WITH MYOSURE N/A 06/01/2016   Procedure: DILATATION & CURETTAGE/HYSTEROSCOPY WITH MYOSURE;  Surgeon: Vena AustriaAndreas Staebler, MD;  Location: [facility] ORS;  Service: Gynecology;  Laterality: N/A;   GUM SURGERY     laproscopy  2007   POLYPECTOMY     endometrial   Social History   Socioeconomic History   Marital status: Single    Spouse name: Not on file   Number of children: Not on file   Years of education: Not on file   Highest education level: Not on file  Occupational History   Not on file  Tobacco  Use   Smoking status: Never   Smokeless tobacco: Never  Vaping Use   Vaping Use: Never used  Substance and Sexual Activity   Alcohol use: No   Drug use: No   Sexual activity: Not on file  Other Topics Concern   Not on file  Social History Narrative   Lives with twin sister   Work- [employer]    No pets    No children    Right handed    No caffeine daily- tea occasionally; eats chocolate    Enjoys shopping, resting, [place].    Social Determinants of Health   Financial Resource Strain: Not on file  Food Insecurity: Not on file  Transportation Needs: Not on file  Physical Activity: Not on file  Stress: Not on file  Social Connections: Not on file   Family History  Problem Relation Age of Onset   Arthritis Mother    Heart disease Mother    Stroke Mother        TIA   Hypertension  Mother    Diabetes Mother    Kidney cancer Mother 61   Heart failure Mother    Hyperlipidemia Mother    Colon cancer Father 40   Arthritis Father    Hyperlipidemia Father    Transient ischemic attack Father    Cancer - Colon Father 58   Prostate cancer Father 58   Valvular heart disease Father    Cancer Maternal Grandmother    Cancer Paternal Grandfather        prostate   Breast cancer Cousin 10       has contact   Outpatient Encounter Medications as of 03/08/2022  Medication Sig   Azelastine HCl 137 MCG/SPRAY SOLN SMARTSIG:2 Spray(s) Both Nares Twice Daily PRN   Calcium Carbonate-Vit D-Min (CALCIUM 1200 PO) Take 2-3 tablets by mouth daily. Reported on 05/26/2015   escitalopram (LEXAPRO) 10 MG tablet Take 10 mg by mouth daily. (Patient not taking: Reported on 03/06/2022)   mometasone (NASONEX) 50 MCG/ACT nasal spray 2 sprays daily.   Multiple Vitamin (MULTIVITAMIN ADULT PO) Take 1 tablet by mouth daily.   naproxen (NAPROSYN) 250 MG tablet Take 1 tablet (250 mg total) by mouth 2 (two) times daily with a meal.   nitroGLYCERIN (NITROSTAT) 0.4 MG SL  tablet Place 1 tablet (0.4 mg total) under the tongue every 5 (five) minutes as needed for up to 25 days for chest pain (Maximum of 3 doses.). (Patient not taking: Reported on 03/06/2022)   omeprazole (PRILOSEC) 20 MG capsule Take 1 capsule (20 mg total) by mouth daily. (Patient not taking: Reported on 03/06/2022)   polyethylene glycol (MIRALAX / GLYCOLAX) 17 g packet Take 17 g by mouth daily.   Povidone-Iodine 7.5 % SWAB Apply 1 applicator topically as needed.   Probiotic Product (PROBIOTIC DAILY PO) Take 1 tablet by mouth daily.   psyllium (REGULOID) 0.52 g capsule Take 0.52 g by mouth daily.   ranolazine (RANEXA) 500 MG 12 hr tablet Take 1 tablet (500 mg total) by mouth 2 (two) times daily.   ZINC OXIDE PO Take 1 tablet by mouth daily.   Facility-Administered Encounter Medications as of 03/08/2022  Medication   lidocaine (XYLOCAINE) 2 % jelly 1 application   ALLERGIES: Allergies  Allergen Reactions   Milk (Cow) Other (See Comments)   Milk-Related Compounds    Omeprazole Other (See Comments)    Pt reports "Chest pain, HA and Dysuria." 01/05/22 patient states she is able to take omeprazole now without a reaction   Penicillins Itching and Rash    VACCINATION STATUS: Immunization History  Administered Date(s) Administered   Td 09/30/2015   Tdap 09/27/2005    HPI Cristina Wade is 51 y.o. female who presents today with a medical history as above. she is being seen in follow-up after she was seen in consultation  for subclinical hyperthyroidism requested by [name], FNP.  She was found to have suppressed TSH on 2 separate occasions, on November 14, 2021 0.04, on November 30, 2021 0.02.  she did have normal free T4 and free T3 in the August blood work. After her last visit, she was sent for thyroid uptake and scan which was found to have a significant finding of suppressed uptake at 1.1% at 24 hours.  No new complaints today.  She reports feeling better in reversing her previous  symptoms.  Her previsit thyroid function tests are consistent with euthyroid presentation, however positive antithyroid antibodies.  Denies any prior history of thyroid dysfunction.  She has regained some of  the weight she lost prior to her last visit.   Denies anxiety, palpitations, nor heat intolerance.    She denies any family history of thyroid dysfunction or thyroid malignancy.  She is not taking any thyroid hormone supplements nor antithyroid medications.   Review of Systems  Constitutional: + mildely fluctuating body weight, recent weight loss, - fatigue, - subjective hyperthermia, no subjective hypothermia Eyes: no blurry vision, no xerophthalmia   Objective:       03/08/2022    3:34 PM 03/06/2022    3:16 PM 02/02/2022    3:55 PM  Vitals with BMI  Height 5\' 9"  5\' 9"  5\' 10"   Weight 126 lbs 3 oz 130 lbs 129 lbs 10 oz  BMI 18.63 19.19 18.6  Systolic 104 102  Diastolic 72 76 70  Pulse 68 83 64    BP 104/72   Pulse 68   Ht 5\' 9"  (1.753 m)   Wt 126 lb 3.2 oz (57.2 kg)   LMP 11/03/2021 (Exact Date)   BMI 18.64 kg/m   Wt Readings from Last 3 Encounters:  03/08/22 126 lb 3.2 oz (57.2 kg)  03/06/22 130 lb (59 kg)  02/02/22 129 lb 9.6 oz (58.8 kg)    Physical Exam  Constitutional:  Body mass index is 18.64 kg/m.,  not in acute distress, normal state of mind   CMP ( most recent) CMP     Component Value Date/Time   NA 142 01/14/2022 2337   K 4.5 01/14/2022 2337   CL 110 01/14/2022 2337   CO2 27 01/14/2022 2337   GLUCOSE 104 (H) 01/14/2022 2337   BUN 16 01/14/2022 2337   CREATININE 0.61 01/14/2022 2337   CALCIUM 9.5 01/14/2022 2337   PROT 6.6 11/14/2021 0814   ALBUMIN 4.3 11/14/2021 0814   AST 17 11/14/2021 0814   ALT 34 11/14/2021 0814   ALKPHOS 59 11/14/2021 0814   BILITOT 0.9 11/14/2021 0814   GFRNONAA >60 01/14/2022 2337   GFRAA >60 12/11/2019 1524     Diabetic Labs (most recent): Lab Results  Component Value Date   HGBA1C 5.7 11/14/2021    HGBA1C 5.8 04/15/2021   HGBA1C 5.7 11/10/2020     Lipid Panel ( most recent) Lipid Panel     Component Value Date/Time   CHOL 157 11/14/2021 0814   TRIG 48.0 11/14/2021 0814   HDL 61.70 11/14/2021 0814   CHOLHDL 3 11/14/2021 0814   VLDL 9.6 11/14/2021 0814   LDLCALC 86 11/14/2021 0814      Lab Results  Component Value Date   TSH 1.961 03/01/2022   TSH 0.02 (L) 11/30/2021   TSH 0.04 (L) 11/14/2021   TSH 1.30 04/15/2021   TSH 1.95 11/10/2020   TSH 1.46 06/03/2018   TSH 1.79 11/29/2016   FREET4 0.90 03/01/2022   FREET4 1.47 11/30/2021       Assessment & Plan:   Subacute thyroiditis  -Reviewed her thyroid uptake and scan with her.  24-hour uptake is only 1.1%.  Her findings are consistent with subacute thyroiditis.  She does not need any antithyroid intervention.  She was put on observation.  I discussed her new labs with her.  Presents with normal thyroid hormone profile, however positive antithyroid antibodies.  This is a risk for future thyroid dysfunction.  She will return in 6 months with repeat thyroid function test.    - I did not initiate any new prescriptions today. - she is advised to maintain close follow up with 06/05/2018,  FNP for primary care needs.   I spent 21 minutes in the care of the patient today including review of labs from Thyroid Function, CMP, and other relevant labs ; imaging/biopsy records (current and previous including abstractions from other facilities); face-to-face time discussing  her lab results and symptoms, medications doses, her options of short and long term treatment based on the latest standards of care / guidelines;   and documenting the encounter.  Cristina Corpus  participated in the discussions, expressed understanding, and voiced agreement with the above plans.  All questions were answered to her satisfaction. she is encouraged to contact clinic should she have any questions or concerns prior to her return visit.    Follow  up plan: Return in about 6 months (around 09/06/2022) for F/U with Pre-visit Labs.   Marquis Lunch, MD [facility] [address] [place], [place] [address] Phone: [phone]  Fax: [phone]     03/08/2022, 6:24 PM  This note was partially dictated with voice recognition software. Similar sounding words can be transcribed inadequately or may not  be corrected upon review.

## 2022-03-13 ENCOUNTER — Other Ambulatory Visit: Payer: BC Managed Care – PPO

## 2022-03-13 ENCOUNTER — Ambulatory Visit: Payer: BC Managed Care – PPO | Admitting: Dietician

## 2022-03-20 NOTE — Telephone Encounter (Signed)
Called pt to schedule appt for [place]. Pt states she is feeling better and will call back if she needs to schedule appt.

## 2022-03-21 ENCOUNTER — Ambulatory Visit (INDEPENDENT_AMBULATORY_CARE_PROVIDER_SITE_OTHER): Payer: BC Managed Care – PPO | Admitting: Urology

## 2022-03-21 ENCOUNTER — Encounter: Payer: Self-pay | Admitting: Urology

## 2022-03-21 ENCOUNTER — Other Ambulatory Visit: Payer: Self-pay | Admitting: Family

## 2022-03-21 VITALS — BP 120/75 | HR 66 | Ht 70.0 in | Wt 127.0 lb

## 2022-03-21 DIAGNOSIS — N319 Neuromuscular dysfunction of bladder, unspecified: Secondary | ICD-10-CM

## 2022-03-21 DIAGNOSIS — R8271 Bacteriuria: Secondary | ICD-10-CM

## 2022-03-21 DIAGNOSIS — R3989 Other symptoms and signs involving the genitourinary system: Secondary | ICD-10-CM

## 2022-03-21 DIAGNOSIS — R8281 Pyuria: Secondary | ICD-10-CM | POA: Diagnosis not present

## 2022-03-21 DIAGNOSIS — N390 Urinary tract infection, site not specified: Secondary | ICD-10-CM

## 2022-03-21 DIAGNOSIS — N3289 Other specified disorders of bladder: Secondary | ICD-10-CM

## 2022-03-21 MED ORDER — NITROFURANTOIN MONOHYD MACRO 100 MG PO CAPS: 100.0000 mg | ORAL_CAPSULE | Freq: Two times a day (BID) | ORAL | 0 refills | Status: AC

## 2022-03-21 NOTE — Progress Notes (Signed)
03/21/22 4:19 PM   Cristina Wade 1970/12/27 [id number]  Referring provider:  [name], FNP [address] [place],  [place] [address]  Urological history  1. High risk hematuria -non-smoker -work up 2015 -bilateral punctate stones -cysto 2019 NED -non-contrast CT 2022 - bilateral punctate stones -cysto, 2022 - NED -RUS, 10/2021 - NED -reports of gross heme -UA negative for micro heme   2. Neurogenic bladder -UDS 2017 indicating nonobstructive urinary retention with poor bladder sensation and external sphincter dyssynergia -completed 12 weeks of PTNS -completed pelvic floor PT -manages with self cathing three times daily -referred to Dr. Raoul Pitch    3. Nephrolithiasis -non-contrast CT 2022 2 mm stone in the right kidney lower pole without hydronephrosis.  Three tiny stones in left kidney lower pole region without hydronephrosis   4. rUTI's -contributing factors of age, incomplete emptying, vaginal atrophy, chronic constipation,  -documented urine cultures over the last year             04/15/2021 streptococcus agalactiae               04/26/2021 Citrobacter freundii    05/03/2021 No Growth   06/10/2021 No Growth   08/27/2021 No Growth   09/15/2021 No Growth    10/06/2021 No Growth    10/21/2021-Enterococcus faecalis   11/02/2021 No Growth  01/17/2022 E.coli    Chief Complaint  Patient presents with   Dysuria     HPI: Cristina Wade is a 51 y.o.female who presents today for UTI.    She is hurting in her bladder for the last three to five days.  She states the pain comes and goes.  Patient denies any modifying or aggravating factors.  Patient denies any gross hematuria, dysuria or suprapubic/flank pain.  Patient denies any fevers, nausea or vomiting.    She states she is having chills that last for about 5 seconds.  She states the chills happen in the evenings and at night.    She is self cathing three time daily at 7 am, 3 pm and 9:30 pm or so.  She  is having 2 to 4 ounces after her cathing.  She is also voiding spontaneously.  UA > 30 WBC's and few bacteria.        PMH: Past Medical History:  Diagnosis Date   Anxiety    Asthma    Chronic pelvic pain in female 2018   Cystitis    Cystocele    Endometrial polyp    Endometriosis    per pt report but never seen during surg   Frequent headaches    Gastritis    GERD (gastroesophageal reflux disease)    rare   Gross hematuria    History of kidney stones    Microscopic hematuria    Migraine    Migraines    Neuropathy    Osteopenia    Urinary disorder    UTI (lower urinary tract infection)     Surgical History: Past Surgical History:  Procedure Laterality Date   COLONOSCOPY N/A 11/05/2014   Procedure: COLONOSCOPY;  Surgeon: Scot Jun, MD;  Location: [facility] ENDOSCOPY;  Service: Endoscopy;  Laterality: N/A;   COLONOSCOPY  11/2016   Dr. Mechele Collin   CYSTOSCOPY  2007   with biopsy    CYSTOSCOPY N/A 06/01/2016   Procedure: CYSTOSCOPY;  Surgeon: Vena Austria, MD;  Location: [facility] ORS;  Service: Gynecology;  Laterality: N/A;   DIAGNOSTIC LAPAROSCOPY     DILATATION & CURETTAGE/HYSTEROSCOPY WITH MYOSURE N/A 04/13/2015  Procedure: DILATATION & CURETTAGE/HYSTEROSCOPY WITH MYOSURE/POLYPECTOMY;  Surgeon: Elenora Fender Ward, MD;  Location: [facility] ORS;  Service: Gynecology;  Laterality: N/A;   DILATATION & CURETTAGE/HYSTEROSCOPY WITH MYOSURE N/A 06/01/2016   Procedure: DILATATION & CURETTAGE/HYSTEROSCOPY WITH MYOSURE;  Surgeon: Vena Austria, MD;  Location: [facility] ORS;  Service: Gynecology;  Laterality: N/A;   GUM SURGERY     laproscopy  2007   POLYPECTOMY     endometrial    Home Medications:  Allergies as of 03/21/2022       Reactions   Milk (cow) Other (See Comments)   Milk-related Compounds    Omeprazole Other (See Comments)   Pt reports "Chest pain, HA and Dysuria." 01/05/22 patient states she is able to take omeprazole now without a reaction   Penicillins Itching, Rash         Medication List        Accurate as of March 21, 2022  4:19 PM. If you have any questions, ask your nurse or doctor.          Azelastine HCl 137 MCG/SPRAY Soln SMARTSIG:2 Spray(s) Both Nares Twice Daily PRN   CALCIUM 1200 PO Take 2-3 tablets by mouth daily. Reported on 05/26/2015   escitalopram 10 MG tablet Commonly known as: LEXAPRO Take 10 mg by mouth daily.   mometasone 50 MCG/ACT nasal spray Commonly known as: NASONEX 2 sprays daily.   MULTIVITAMIN ADULT PO Take 1 tablet by mouth daily.   naproxen 250 MG tablet Commonly known as: NAPROSYN Take 1 tablet (250 mg total) by mouth 2 (two) times daily with a meal.   nitroGLYCERIN 0.4 MG SL tablet Commonly known as: NITROSTAT Place 1 tablet (0.4 mg total) under the tongue every 5 (five) minutes as needed for up to 25 days for chest pain (Maximum of 3 doses.).   omeprazole 20 MG capsule Commonly known as: PRILOSEC Take 1 capsule (20 mg total) by mouth daily.   polyethylene glycol 17 g packet Commonly known as: MIRALAX / GLYCOLAX Take 17 g by mouth daily.   Povidone-Iodine 7.5 % Swab Apply 1 applicator topically as needed.   PROBIOTIC DAILY PO Take 1 tablet by mouth daily.   psyllium 0.52 g capsule Commonly known as: REGULOID Take 0.52 g by mouth daily.   ranolazine 500 MG 12 hr tablet Commonly known as: Ranexa Take 1 tablet (500 mg total) by mouth 2 (two) times daily.   ZINC OXIDE PO Take 1 tablet by mouth daily.        Allergies:  Allergies  Allergen Reactions   Milk (Cow) Other (See Comments)   Milk-Related Compounds    Omeprazole Other (See Comments)    Pt reports "Chest pain, HA and Dysuria." 01/05/22 patient states she is able to take omeprazole now without a reaction   Penicillins Itching and Rash    Family History: Family History  Problem Relation Age of Onset   Arthritis Mother    Heart disease Mother    Stroke Mother        TIA   Hypertension Mother    Diabetes Mother     Kidney cancer Mother 31   Heart failure Mother    Hyperlipidemia Mother    Colon cancer Father 48   Arthritis Father    Hyperlipidemia Father    Transient ischemic attack Father    Cancer - Colon Father 10   Prostate cancer Father 41   Valvular heart disease Father    Cancer Maternal Grandmother    Cancer Paternal Grandfather  prostate   Breast cancer Cousin 35       has contact    Social History:  reports that she has never smoked. She has never used smokeless tobacco. She reports that she does not drink alcohol and does not use drugs.   Physical Exam: BP 120/75   Pulse 66   Ht 5\' 10"  (1.778 m)   Wt 127 lb (57.6 kg)   LMP 11/03/2021 (Exact Date)   BMI 18.22 kg/m   Constitutional:  Well nourished. Alert and oriented, No acute distress. HEENT: [place] AT, moist mucus membranes.  Trachea midline Cardiovascular: No clubbing, cyanosis, or edema. Respiratory: Normal respiratory effort, no increased work of breathing.  Neurologic: Grossly intact, no focal deficits, moving all 4 extremities. Psychiatric: Normal mood and affect.    Laboratory Data:    Latest Ref Rng & Units 01/14/2022   11:37 PM 11/30/2021    2:47 PM 11/14/2021    8:14 AM  CBC  WBC 4.0 - 10.5 K/uL 6.8  5.5  3.7   Hemoglobin 12.0 - 15.0 g/dL 11/16/2021  93.9  03.0   Hematocrit 36.0 - 46.0 % 42.9  38.4  40.3   Platelets 150 - 400 K/uL 207  199.0  187.0      Pertinent imaging N/A  Assessment & Plan:    1. High risk hematuria -non-smoker -work up x 2 - NED -no reports of gross heme -UA negative for microscopic hematuria  2. Neurogenic bladder  - Currently treating wit CIC x 3 daily  - Per patient she was seen by dr 09.2 for possible  interstim and was told she was not a candidate  - She is very anxious and perseverates over poor bladder sensation and inability to void on her own  - second opinion with Dr. Sherron Monday at [facility] pending   3. Suspected UTI -UA with pyuria and bacteruria  -urine  sent for culture -started on Macrobid 100 mg, twice daily for seven days   [facility]   [facility] [address], [address] [place], [place] [phone]

## 2022-03-22 LAB — URINALYSIS, COMPLETE
Bilirubin, UA: NEGATIVE
Glucose, UA: NEGATIVE
Ketones, UA: NEGATIVE
Nitrite, UA: NEGATIVE
Protein,UA: NEGATIVE
Specific Gravity, UA: <1.005 — ABNORMAL LOW (ref 1.005–1.030)
Urobilinogen, Ur: 0.2 mg/dL (ref 0.2–1.0)
pH, UA: 6.5 (ref 5.0–7.5)

## 2022-03-22 LAB — MICROSCOPIC EXAMINATION: WBC, UA: >30 /hpf — AB (ref 0–5)

## 2022-03-24 ENCOUNTER — Ambulatory Visit
Admission: RE | Admit: 2022-03-24 | Discharge: 2022-03-24 | Disposition: A | Discharge status: Home / Self Care | Payer: BC Managed Care – PPO | Source: Ambulatory Visit | Attending: Urology | Admitting: Urology

## 2022-03-24 ENCOUNTER — Telehealth: Payer: Self-pay

## 2022-03-24 DIAGNOSIS — N390 Urinary tract infection, site not specified: Secondary | ICD-10-CM

## 2022-03-24 LAB — CULTURE, URINE COMPREHENSIVE

## 2022-03-24 NOTE — Telephone Encounter (Signed)
Patient advised.  See mychart message also, patient agreed to proceeding with KUB and will try and get this done on 03/25/22.

## 2022-03-24 NOTE — Telephone Encounter (Signed)
-----   Message from Harle Battiest, PA-C sent at 03/24/2022  2:29 PM EST ----- Please let Mrs. Cristina Wade know that her urine culture is negative, so she can stop her antibiotic.

## 2022-03-27 ENCOUNTER — Telehealth: Payer: Self-pay

## 2022-03-27 NOTE — Telephone Encounter (Signed)
Notified pt via Mychart. See separate encounter.

## 2022-03-27 NOTE — Telephone Encounter (Signed)
-----   Message from Harle Battiest, PA-C sent at 03/26/2022  7:18 PM EST ----- Please let Cristina Wade know that her [name] shows that she is constipated.  I would have her reach out to her PCP or her GI specialist for further follow up.

## 2022-05-02 ENCOUNTER — Other Ambulatory Visit: Payer: Self-pay

## 2022-05-02 DIAGNOSIS — Z1231 Encounter for screening mammogram for malignant neoplasm of breast: Secondary | ICD-10-CM

## 2022-06-26 ENCOUNTER — Encounter: Payer: Self-pay | Admitting: Family

## 2022-06-27 NOTE — Telephone Encounter (Signed)
Spoke to pt and scheduled f/up and labs

## 2022-06-27 NOTE — Telephone Encounter (Signed)
LVM to schedule f/up visit

## 2022-07-14 ENCOUNTER — Ambulatory Visit
Admission: RE | Admit: 2022-07-14 | Discharge: 2022-07-14 | Disposition: A | Discharge status: Home / Self Care | Payer: BC Managed Care – PPO | Source: Ambulatory Visit | Attending: Obstetrics and Gynecology | Admitting: Obstetrics and Gynecology

## 2022-07-14 DIAGNOSIS — Z1231 Encounter for screening mammogram for malignant neoplasm of breast: Secondary | ICD-10-CM | POA: Diagnosis present

## 2022-08-11 ENCOUNTER — Ambulatory Visit: Payer: BC Managed Care – PPO | Admitting: Physician Assistant

## 2022-08-11 VITALS — BP 108/67 | HR 73 | Ht 70.0 in | Wt 127.0 lb

## 2022-08-11 DIAGNOSIS — R8271 Bacteriuria: Secondary | ICD-10-CM | POA: Diagnosis not present

## 2022-08-11 DIAGNOSIS — R8281 Pyuria: Secondary | ICD-10-CM | POA: Diagnosis not present

## 2022-08-11 DIAGNOSIS — N390 Urinary tract infection, site not specified: Secondary | ICD-10-CM

## 2022-08-11 DIAGNOSIS — R82998 Other abnormal findings in urine: Secondary | ICD-10-CM

## 2022-08-11 DIAGNOSIS — Z87440 Personal history of urinary (tract) infections: Secondary | ICD-10-CM | POA: Diagnosis not present

## 2022-08-11 LAB — URINALYSIS, COMPLETE
Bilirubin, UA: NEGATIVE
Glucose, UA: NEGATIVE
Ketones, UA: NEGATIVE
Nitrite, UA: POSITIVE — AB
Protein,UA: NEGATIVE
RBC, UA: NEGATIVE
Specific Gravity, UA: 1.01 (ref 1.005–1.030)
Urobilinogen, Ur: 0.2 mg/dL (ref 0.2–1.0)
pH, UA: 6.5 (ref 5.0–7.5)

## 2022-08-11 LAB — BLADDER SCAN AMB NON-IMAGING: Scan Result: 0

## 2022-08-11 LAB — MICROSCOPIC EXAMINATION

## 2022-08-11 MED ORDER — NITROFURANTOIN MONOHYD MACRO 100 MG PO CAPS: 100.0000 mg | ORAL_CAPSULE | Freq: Two times a day (BID) | ORAL | 0 refills | Status: AC

## 2022-08-11 NOTE — Progress Notes (Unsigned)
08/11/2022 3:33 PM   Cristina Wade 11/01/1970 [id number]  CC: Chief Complaint  Patient presents with   Follow-up   HPI: Cristina Wade is a 52 y.o. female with PMH high risk hematuria with benign workup in 2020 05/2021, neurogenic bladder managed with CIC 3 times daily, nephrolithiasis, and recurrent UTI who presents today for evaluation of possible UTI.  She is accompanied today by her sister, [name].   Today she reports 6 days of occasionally malodorous urine and occasional pain in her bladder.  Has not taken anything for this.  In-office UA today positive for nitrites and 1+ leukocytes; urine microscopy with 6-10 WBCs/HPF and many bacteria. PVR 0mL.  PMH: Past Medical History:  Diagnosis Date   Anxiety    Asthma    Chronic pelvic pain in female 2018   Cystitis    Cystocele    Endometrial polyp    Endometriosis    per pt report but never seen during surg   Frequent headaches    Gastritis    GERD (gastroesophageal reflux disease)    rare   Gross hematuria    History of kidney stones    Microscopic hematuria    Migraine    Migraines    Neuropathy    Osteopenia    Urinary disorder    UTI (lower urinary tract infection)     Surgical History: Past Surgical History:  Procedure Laterality Date   COLONOSCOPY N/A 11/05/2014   Procedure: COLONOSCOPY;  Surgeon: Scot Jun, MD;  Location: [facility] ENDOSCOPY;  Service: Endoscopy;  Laterality: N/A;   COLONOSCOPY  11/2016   Dr. Mechele Collin   CYSTOSCOPY  2007   with biopsy    CYSTOSCOPY N/A 06/01/2016   Procedure: CYSTOSCOPY;  Surgeon: Vena Austria, MD;  Location: [facility] ORS;  Service: Gynecology;  Laterality: N/A;   DIAGNOSTIC LAPAROSCOPY     DILATATION & CURETTAGE/HYSTEROSCOPY WITH MYOSURE N/A 04/13/2015   Procedure: DILATATION & CURETTAGE/HYSTEROSCOPY WITH MYOSURE/POLYPECTOMY;  Surgeon: Elenora Fender Ward, MD;  Location: [facility] ORS;  Service: Gynecology;  Laterality: N/A;   DILATATION & CURETTAGE/HYSTEROSCOPY WITH MYOSURE N/A  06/01/2016   Procedure: DILATATION & CURETTAGE/HYSTEROSCOPY WITH MYOSURE;  Surgeon: Vena Austria, MD;  Location: [facility] ORS;  Service: Gynecology;  Laterality: N/A;   GUM SURGERY     laproscopy  2007   POLYPECTOMY     endometrial    Home Medications:  Allergies as of 08/11/2022       Reactions   Milk (cow) Other (See Comments)   Milk-related Compounds    Omeprazole Other (See Comments)   Pt reports "Chest pain, HA and Dysuria." 01/05/22 patient states she is able to take omeprazole now without a reaction   Penicillins Itching, Rash        Medication List        Accurate as of August 11, 2022  3:33 PM. If you have any questions, ask your nurse or doctor.          Azelastine HCl 137 MCG/SPRAY Soln SMARTSIG:2 Spray(s) Both Nares Twice Daily PRN   CALCIUM 1200 PO Take 2-3 tablets by mouth daily. Reported on 05/26/2015   escitalopram 10 MG tablet Commonly known as: LEXAPRO Take 1 tablet by mouth once daily   mometasone 50 MCG/ACT nasal spray Commonly known as: NASONEX 2 sprays daily.   MULTIVITAMIN ADULT PO Take 1 tablet by mouth daily.   naproxen 250 MG tablet Commonly known as: NAPROSYN Take 1 tablet (250 mg total) by mouth 2 (two) times daily with a  meal.   nitroGLYCERIN 0.4 MG SL tablet Commonly known as: NITROSTAT Place 1 tablet (0.4 mg total) under the tongue every 5 (five) minutes as needed for up to 25 days for chest pain (Maximum of 3 doses.).   omeprazole 20 MG capsule Commonly known as: PRILOSEC Take 1 capsule (20 mg total) by mouth daily.   polyethylene glycol 17 g packet Commonly known as: MIRALAX / GLYCOLAX Take 17 g by mouth daily.   Povidone-Iodine 7.5 % Swab Apply 1 applicator topically as needed.   PROBIOTIC DAILY PO Take 1 tablet by mouth daily.   psyllium 0.52 g capsule Commonly known as: REGULOID Take 0.52 g by mouth daily.   ranolazine 500 MG 12 hr tablet Commonly known as: Ranexa Take 1 tablet (500 mg total) by mouth 2 (two)  times daily.   ZINC OXIDE PO Take 1 tablet by mouth daily.        Allergies:  Allergies  Allergen Reactions   Milk (Cow) Other (See Comments)   Milk-Related Compounds    Omeprazole Other (See Comments)    Pt reports "Chest pain, HA and Dysuria." 01/05/22 patient states she is able to take omeprazole now without a reaction   Penicillins Itching and Rash    Family History: Family History  Problem Relation Age of Onset   Arthritis Mother    Heart disease Mother    Stroke Mother        TIA   Hypertension Mother    Diabetes Mother    Kidney cancer Mother 68   Heart failure Mother    Hyperlipidemia Mother    Colon cancer Father 55   Arthritis Father    Hyperlipidemia Father    Transient ischemic attack Father    Cancer - Colon Father 107   Prostate cancer Father 79   Valvular heart disease Father    Cancer Maternal Grandmother    Cancer Paternal Grandfather        prostate   Breast cancer Cousin 100       has contact    Social History:   reports that she has never smoked. She has never used smokeless tobacco. She reports that she does not drink alcohol and does not use drugs.  Physical Exam: BP 108/67   Pulse 73   Ht 5\' 10"  (1.778 m)   Wt 127 lb (57.6 kg)   LMP 11/03/2021 (Exact Date)   BMI 18.22 kg/m   Constitutional:  Alert and oriented, no acute distress, nontoxic appearing HEENT: [place], AT Cardiovascular: No clubbing, cyanosis, or edema Respiratory: Normal respiratory effort, no increased work of breathing Skin: No rashes, bruises or suspicious lesions Neurologic: Grossly intact, no focal deficits, moving all 4 extremities Psychiatric: Normal mood and affect  Laboratory Data: Results for orders placed or performed in visit on 08/11/22  Microscopic Examination   Urine  Result Value Ref Range   WBC, UA 6-10 (A) 0 - 5 /hpf   RBC, Urine 0-2 0 - 2 /hpf   Epithelial Cells (non renal) 0-10 0 - 10 /hpf   Bacteria, UA Many (A) None seen/Few  Urinalysis,  Complete  Result Value Ref Range   Specific Gravity, UA 1.010 1.005 - 1.030   pH, UA 6.5 5.0 - 7.5   Color, UA Yellow Yellow   Appearance Ur Hazy (A) Clear   Leukocytes,UA 1+ (A) Negative   Protein,UA Negative Negative/Trace   Glucose, UA Negative Negative   Ketones, UA Negative Negative   RBC, UA Negative Negative  Bilirubin, UA Negative Negative   Urobilinogen, Ur 0.2 0.2 - 1.0 mg/dL   Nitrite, UA Positive (A) Negative   Microscopic Examination See below:   BLADDER SCAN AMB NON-IMAGING  Result Value Ref Range   Scan Result 0 ml    Assessment & Plan:   1. Recurrent UTI UA today is nitrite positive and she has mild pyuria in the setting of bacteriuria.  Differential includes a mild cystitis versus colonization in the setting of CIC.  Will send in Macrobid empirically but asked her not to fill it unless she is too uncomfortable to wait for her culture results to come back.  Will call her with culture results when available.  She is in agreement with this plan. - Urinalysis, Complete - BLADDER SCAN AMB NON-IMAGING - CULTURE, URINE COMPREHENSIVE - nitrofurantoin, macrocrystal-monohydrate, (MACROBID) 100 MG capsule; Take 1 capsule (100 mg total) by mouth 2 (two) times daily for 5 days.  Dispense: 10 capsule; Refill: 0   Return for Will call with results.  Carman Ching, PA-C  [facility] [place] [address], [address] [place], [place] [address] [phone]

## 2022-08-16 LAB — CULTURE, URINE COMPREHENSIVE

## 2022-08-22 ENCOUNTER — Other Ambulatory Visit: Payer: BC Managed Care – PPO

## 2022-08-22 DIAGNOSIS — N390 Urinary tract infection, site not specified: Secondary | ICD-10-CM

## 2022-08-22 LAB — URINALYSIS, COMPLETE
Bilirubin, UA: NEGATIVE
Glucose, UA: NEGATIVE
Ketones, UA: NEGATIVE
Nitrite, UA: POSITIVE — AB
Protein,UA: NEGATIVE
RBC, UA: NEGATIVE
Specific Gravity, UA: 1.015 (ref 1.005–1.030)
Urobilinogen, Ur: 0.2 mg/dL (ref 0.2–1.0)
pH, UA: 7 (ref 5.0–7.5)

## 2022-08-22 LAB — MICROSCOPIC EXAMINATION: WBC, UA: >30 /hpf — AB (ref 0–5)

## 2022-08-22 NOTE — Telephone Encounter (Signed)
Spoke to patient and scheduled a lab appointment for a urine drop off.

## 2022-08-23 MED ORDER — NITROFURANTOIN MONOHYD MACRO 100 MG PO CAPS: 100.0000 mg | ORAL_CAPSULE | Freq: Two times a day (BID) | ORAL | 0 refills | Status: DC

## 2022-08-23 NOTE — Telephone Encounter (Signed)
Patient notified Macrobid was sent to pharmacy. 

## 2022-08-26 LAB — CULTURE, URINE COMPREHENSIVE

## 2022-08-28 ENCOUNTER — Other Ambulatory Visit: Payer: Self-pay | Admitting: Urology

## 2022-08-28 DIAGNOSIS — N390 Urinary tract infection, site not specified: Secondary | ICD-10-CM

## 2022-08-28 DIAGNOSIS — N200 Calculus of kidney: Secondary | ICD-10-CM

## 2022-08-28 NOTE — Telephone Encounter (Signed)
noted 

## 2022-08-28 NOTE — Progress Notes (Signed)
Patient reached out through MyChart stating after her antibiotic she was no better and still experiencing pain.  She has a history of a bilateral nephrolithiasis, so we will get a RUS to evaluate for hydro.

## 2022-08-29 ENCOUNTER — Ambulatory Visit
Admission: RE | Admit: 2022-08-29 | Discharge: 2022-08-29 | Disposition: A | Discharge status: Home / Self Care | Payer: BC Managed Care – PPO | Source: Ambulatory Visit | Attending: Urology | Admitting: Urology

## 2022-08-29 DIAGNOSIS — N200 Calculus of kidney: Secondary | ICD-10-CM

## 2022-08-29 DIAGNOSIS — N390 Urinary tract infection, site not specified: Secondary | ICD-10-CM | POA: Diagnosis present

## 2022-08-30 ENCOUNTER — Observation Stay
Admission: EM | Admit: 2022-08-30 | Discharge: 2022-08-31 | Disposition: A | Discharge status: Home / Self Care | Payer: BC Managed Care – PPO | Attending: Family Medicine | Admitting: Family Medicine

## 2022-08-30 ENCOUNTER — Other Ambulatory Visit: Payer: Self-pay

## 2022-08-30 ENCOUNTER — Emergency Department: Payer: BC Managed Care – PPO

## 2022-08-30 DIAGNOSIS — K5900 Constipation, unspecified: Secondary | ICD-10-CM | POA: Insufficient documentation

## 2022-08-30 DIAGNOSIS — K3189 Other diseases of stomach and duodenum: Secondary | ICD-10-CM | POA: Diagnosis not present

## 2022-08-30 DIAGNOSIS — R519 Headache, unspecified: Secondary | ICD-10-CM | POA: Insufficient documentation

## 2022-08-30 DIAGNOSIS — Z79899 Other long term (current) drug therapy: Secondary | ICD-10-CM | POA: Diagnosis not present

## 2022-08-30 DIAGNOSIS — R935 Abnormal findings on diagnostic imaging of other abdominal regions, including retroperitoneum: Secondary | ICD-10-CM | POA: Diagnosis not present

## 2022-08-30 DIAGNOSIS — F32A Depression, unspecified: Secondary | ICD-10-CM

## 2022-08-30 DIAGNOSIS — K2971 Gastritis, unspecified, with bleeding: Secondary | ICD-10-CM | POA: Diagnosis not present

## 2022-08-30 DIAGNOSIS — J45909 Unspecified asthma, uncomplicated: Secondary | ICD-10-CM | POA: Diagnosis not present

## 2022-08-30 DIAGNOSIS — K920 Hematemesis: Secondary | ICD-10-CM | POA: Diagnosis present

## 2022-08-30 DIAGNOSIS — K219 Gastro-esophageal reflux disease without esophagitis: Secondary | ICD-10-CM

## 2022-08-30 DIAGNOSIS — K922 Gastrointestinal hemorrhage, unspecified: Secondary | ICD-10-CM | POA: Diagnosis present

## 2022-08-30 LAB — CBC WITH DIFFERENTIAL/PLATELET
Abs Immature Granulocytes: 0.02 10*3/uL (ref 0.00–0.07)
Basophils Absolute: 0 10*3/uL (ref 0.0–0.1)
Basophils Relative: 0 %
Eosinophils Absolute: 0 10*3/uL (ref 0.0–0.5)
Eosinophils Relative: 0 %
HCT: 39.7 % (ref 36.0–46.0)
Hemoglobin: 13.1 g/dL (ref 12.0–15.0)
Immature Granulocytes: 0 %
Lymphocytes Relative: 9 %
Lymphs Abs: 0.7 10*3/uL (ref 0.7–4.0)
MCH: 30.5 pg (ref 26.0–34.0)
MCHC: 33 g/dL (ref 30.0–36.0)
MCV: 92.3 fL (ref 80.0–100.0)
Monocytes Absolute: 0.3 10*3/uL (ref 0.1–1.0)
Monocytes Relative: 4 %
Neutro Abs: 6.7 10*3/uL (ref 1.7–7.7)
Neutrophils Relative %: 87 %
Platelets: 196 10*3/uL (ref 150–400)
RBC: 4.3 MIL/uL (ref 3.87–5.11)
RDW: 12.5 % (ref 11.5–15.5)
WBC: 7.9 10*3/uL (ref 4.0–10.5)
nRBC: 0 % (ref 0.0–0.2)

## 2022-08-30 LAB — URINALYSIS, ROUTINE W REFLEX MICROSCOPIC
Bilirubin Urine: NEGATIVE
Glucose, UA: NEGATIVE mg/dL
Hgb urine dipstick: NEGATIVE
Ketones, ur: NEGATIVE mg/dL
Leukocytes,Ua: NEGATIVE
Nitrite: NEGATIVE
Protein, ur: NEGATIVE mg/dL
Specific Gravity, Urine: 1.004 — ABNORMAL LOW (ref 1.005–1.030)
pH: 7 (ref 5.0–8.0)

## 2022-08-30 LAB — COMPREHENSIVE METABOLIC PANEL
ALT: 21 U/L (ref 0–44)
AST: 23 U/L (ref 15–41)
Albumin: 4.2 g/dL (ref 3.5–5.0)
Alkaline Phosphatase: 55 U/L (ref 38–126)
Anion gap: 9 (ref 5–15)
BUN: 14 mg/dL (ref 6–20)
CO2: 26 mmol/L (ref 22–32)
Calcium: 9.2 mg/dL (ref 8.9–10.3)
Chloride: 102 mmol/L (ref 98–111)
Creatinine, Ser: 0.78 mg/dL (ref 0.44–1.00)
GFR, Estimated: >60 mL/min (ref >60)
Glucose, Bld: 130 mg/dL — ABNORMAL HIGH (ref 70–99)
Potassium: 3.7 mmol/L (ref 3.5–5.1)
Sodium: 137 mmol/L (ref 135–145)
Total Bilirubin: 0.5 mg/dL (ref 0.3–1.2)
Total Protein: 6.7 g/dL (ref 6.5–8.1)

## 2022-08-30 LAB — LIPASE, BLOOD: Lipase: 50 U/L (ref 11–51)

## 2022-08-30 LAB — TROPONIN I (HIGH SENSITIVITY)
Troponin I (High Sensitivity): <2 ng/L (ref <18)
Troponin I (High Sensitivity): <2 ng/L (ref <18)

## 2022-08-30 NOTE — Progress Notes (Deleted)
03/21/22 4:19 PM   Cristina Wade Jul 15, 1970 [id number]  Referring provider:  [name], FNP [address] [place],  [place] [address]  Urological history  1. High risk hematuria -non-smoker -work up 2015 -bilateral punctate stones -cysto 2019 NED -non-contrast CT 2022 - bilateral punctate stones -cysto, 2022 - NED -RUS, 10/2021 - NED -RUS (08/2022) - NED   2. Neurogenic bladder -UDS 2017 indicating nonobstructive urinary retention with poor bladder sensation and external sphincter dyssynergia -completed 12 weeks of PTNS -completed pelvic floor PT -manages with self cathing three times daily   3. Nephrolithiasis -non-contrast CT 2022 2 mm stone in the right kidney lower pole without hydronephrosis.  Three tiny stones in left kidney lower pole region without hydronephrosis -RUS (08/2022) - no stones seen    4. rUTI's -contributing factors of age, incomplete emptying, vaginal atrophy, chronic constipation,  -documented urine cultures over the last year             09/15/2021 No Growth   10/06/2021 No Growth   10/21/2021-Enterococcus faecalis  11/02/2021 No Growth  01/17/2022 E.coli  03/21/2022 MUF  04/26/204 E.coli  08/22/2022 E.coli   Chief Complaint  Patient presents with   Dysuria     HPI: Cristina Wade is a 52 y.o.female who presents today for pain.  She was seen on 08/11/2022 by Sam for complaints of malodorous urine and occasional pain in her bladder.  Urinalysis was unremarkable.  Urine was sent for culture.  She completed a course of culture appropriate antibiotics.  She then contacted the office a few days later stating that her symptoms are no better.  She dropped off a urine specimen on May 7.  It was yellow hazy, specific gravity 1.015, pH 7.0, 2+ leukocytes, nitrate positive, greater than 30 WBCs, 0-2 RBCs, 0-10 epithelial cells and many bacteria.  Her urine culture was again positive for E. coli.   She was treated again with culture  appropriate antibiotics and contacted the office stating she was still having pain.  Renal ultrasound was then ordered.  Recent ultrasound w/ hydro or stones.    UA ***  PVR ***     PMH: Past Medical History:  Diagnosis Date   Anxiety    Asthma    Chronic pelvic pain in female 2018   Cystitis    Cystocele    Endometrial polyp    Endometriosis    per pt report but never seen during surg   Frequent headaches    Gastritis    GERD (gastroesophageal reflux disease)    rare   Gross hematuria    History of kidney stones    Microscopic hematuria    Migraine    Migraines    Neuropathy    Osteopenia    Urinary disorder    UTI (lower urinary tract infection)     Surgical History: Past Surgical History:  Procedure Laterality Date   COLONOSCOPY N/A 11/05/2014   Procedure: COLONOSCOPY;  Surgeon: Scot Jun, MD;  [facility] ENDOSCOPY;  Service: Endoscopy;  Laterality: N/A;   COLONOSCOPY  11/2016   Dr. Mechele Collin   CYSTOSCOPY  2007   with biopsy    CYSTOSCOPY N/A 06/01/2016   Procedure: CYSTOSCOPY;  Surgeon: Vena Austria, MD;  Location: [facility] ORS;  Service: Gynecology;  Laterality: N/A;   DIAGNOSTIC LAPAROSCOPY     DILATATION & CURETTAGE/HYSTEROSCOPY WITH MYOSURE N/A 04/13/2015   Procedure: DILATATION & CURETTAGE/HYSTEROSCOPY WITH MYOSURE/POLYPECTOMY;  Surgeon: Elenora Fender Ward, MD;  Location: [facility] ORS;  Service: Gynecology;  Laterality: N/A;   DILATATION & CURETTAGE/HYSTEROSCOPY WITH MYOSURE N/A 06/01/2016   Procedure: DILATATION & CURETTAGE/HYSTEROSCOPY WITH MYOSURE;  Surgeon: Vena Austria, MD;  Location: [facility] ORS;  Service: Gynecology;  Laterality: N/A;   GUM SURGERY     laproscopy  2007   POLYPECTOMY     endometrial    Home Medications:  Allergies as of 03/21/2022       Reactions   Milk (cow) Other (See Comments)   Milk-related Compounds    Omeprazole Other (See Comments)   Pt reports "Chest pain, HA and Dysuria." 01/05/22 patient states she is able to take  omeprazole now without a reaction   Penicillins Itching, Rash        Medication List        Accurate as of March 21, 2022  4:19 PM. If you have any questions, ask your nurse or doctor.          Azelastine HCl 137 MCG/SPRAY Soln SMARTSIG:2 Spray(s) Both Nares Twice Daily PRN   CALCIUM 1200 PO Take 2-3 tablets by mouth daily. Reported on 05/26/2015   escitalopram 10 MG tablet Commonly known as: LEXAPRO Take 10 mg by mouth daily.   mometasone 50 MCG/ACT nasal spray Commonly known as: NASONEX 2 sprays daily.   MULTIVITAMIN ADULT PO Take 1 tablet by mouth daily.   naproxen 250 MG tablet Commonly known as: NAPROSYN Take 1 tablet (250 mg total) by mouth 2 (two) times daily with a meal.   nitroGLYCERIN 0.4 MG SL tablet Commonly known as: NITROSTAT Place 1 tablet (0.4 mg total) under the tongue every 5 (five) minutes as needed for up to 25 days for chest pain (Maximum of 3 doses.).   omeprazole 20 MG capsule Commonly known as: PRILOSEC Take 1 capsule (20 mg total) by mouth daily.   polyethylene glycol 17 g packet Commonly known as: MIRALAX / GLYCOLAX Take 17 g by mouth daily.   Povidone-Iodine 7.5 % Swab Apply 1 applicator topically as needed.   PROBIOTIC DAILY PO Take 1 tablet by mouth daily.   psyllium 0.52 g capsule Commonly known as: REGULOID Take 0.52 g by mouth daily.   ranolazine 500 MG 12 hr tablet Commonly known as: Ranexa Take 1 tablet (500 mg total) by mouth 2 (two) times daily.   ZINC OXIDE PO Take 1 tablet by mouth daily.        Allergies:  Allergies  Allergen Reactions   Milk (Cow) Other (See Comments)   Milk-Related Compounds    Omeprazole Other (See Comments)    Pt reports "Chest pain, HA and Dysuria." 01/05/22 patient states she is able to take omeprazole now without a reaction   Penicillins Itching and Rash    Family History: Family History  Problem Relation Age of Onset   Arthritis Mother    Heart disease Mother    Stroke  Mother        TIA   Hypertension Mother    Diabetes Mother    Kidney cancer Mother 61   Heart failure Mother    Hyperlipidemia Mother    Colon cancer Father 22   Arthritis Father    Hyperlipidemia Father    Transient ischemic attack Father    Cancer - Colon Father 6   Prostate cancer Father 71   Valvular heart disease Father    Cancer Maternal Grandmother    Cancer Paternal Grandfather        prostate   Breast cancer Cousin 36  has contact    Social History:  reports that she has never smoked. She has never used smokeless tobacco. She reports that she does not drink alcohol and does not use drugs.   Physical Exam: BP 120/75   Pulse 66   Ht 5\' 10"  (1.778 m)   Wt 127 lb (57.6 kg)   LMP 11/03/2021 (Exact Date)   BMI 18.22 kg/m   Constitutional:  Well nourished. Alert and oriented, No acute distress. HEENT: [place] AT, moist mucus membranes.  Trachea midline, no masses. Cardiovascular: No clubbing, cyanosis, or edema. Respiratory: Normal respiratory effort, no increased work of breathing. GU: No CVA tenderness.  No bladder fullness or masses. Vulvovaginal atrophy w/ pallor, loss of rugae, introital retraction, excoriations.  Vulvar thinning, fusion of labia, clitoral hood retraction, prominent urethral meatus.   *** external genitalia, *** pubic hair distribution, no lesions.  Normal urethral meatus, no lesions, no prolapse, no discharge.   No urethral masses, tenderness and/or tenderness. No bladder fullness, tenderness or masses. *** vagina mucosa, *** estrogen effect, no discharge, no lesions, *** pelvic support, *** cystocele and *** rectocele noted.  No cervical motion tenderness.  Uterus is freely mobile and non-fixed.  No adnexal/parametria masses or tenderness noted.  Anus and perineum are without rashes or lesions.   ***  Neurologic: Grossly intact, no focal deficits, moving all 4 extremities. Psychiatric: Normal mood and affect.    Laboratory Data: Urinalysis  See  EPIC and HPI I have reviewed the labs.     Pertinent imaging ***  Narrative & Impression  CLINICAL DATA:  Recurrent UTIs.  History of stones.   EXAM: RENAL / URINARY TRACT ULTRASOUND COMPLETE   COMPARISON:  Renal ultrasound, 11/08/2021.  CT, 10/04/2020.   FINDINGS: Right Kidney:   Renal measurements: 11.8 x 3.8 x 5.9 cm = volume: 136 mL. Echogenicity within normal limits. No mass or hydronephrosis visualized.   Left Kidney:   Renal measurements: 11.7 x 5.8 x 5.2 cm = volume: 182 mL. Echogenicity within normal limits. No mass or hydronephrosis visualized.   Bladder:   Appears normal for degree of bladder distention.   Other:   None.   IMPRESSION: 1. Normal renal ultrasound. No change from prior renal ultrasound. Tiny intrarenal stones noted on the previous CT are not visualized sonographically.     Electronically Signed   By: Amie Portland M.D.   On: 08/29/2022 16:42  I have independently reviewed the films.  See HPI.     Assessment & Plan:    1. High risk hematuria -non-smoker -work up x 2 - NED -no reports of gross heme -UA ***  2. Neurogenic bladder  - Currently treating wit CIC x 3 daily  - Per patient she was seen by dr Sherron Monday for possible  interstim and was told she was not a candidate  - She is very anxious and perseverates over poor bladder sensation and inability to void on her own  - second opinion with Dr. Loni Beckwith at [facility] pending   3. Suspected UTI -UA with pyuria and bacteruria  -urine sent for culture -started on Macrobid 100 mg, twice daily for seven days   Cloretta Ned   [facility] [address] [place], [place] [address] [phone]

## 2022-08-30 NOTE — ED Provider Notes (Signed)
[facility] Provider Note    Event Date/Time   First MD Initiated Contact with Patient 08/30/22 2335     (approximate)   History   Chest Pain, Hematemesis, and Abdominal Pain   HPI  Cristina Wade is a 52 y.o. female with recent foot injury who comes in with concerns for vomiting blood.  Patient reports that she has been on ibuprofen 800 3 times daily for about a week due to a foot injury.  She states that she has recently been trying to come down off of it.  She states that she today developed around 3:30 this headache, chest pain radiating down into her abdomen and then had an episode of bloody vomitus that was about 5-6 dime sized blood that she noted.  She reports still having some abdominal discomfort but the chest pain seems to be slightly improved.  Still has a mild headache denies typically getting headaches.  She denies any black tarry stools but does report some intermittent bright red blood per rectum that she attributes to hemorrhoids.   Physical Exam   Triage Vital Signs: ED Triage Vitals  Enc Vitals Group     BP 08/30/22 2049 110/60     Pulse Rate 08/30/22 2049 64     Resp 08/30/22 2049 18     Temp 08/30/22 2049 98.2 F (36.8 C)     Temp Source 08/30/22 2049 Oral     SpO2 08/30/22 2049 98 %     Weight 08/30/22 2050 126 lb (57.2 kg)     Height 08/30/22 2050 5\' 10"  (1.778 m)     Head Circumference --      Peak Flow --      Pain Score 08/30/22 2050 8     Pain Loc --      Pain Edu? --      Excl. in GC? --     Most recent vital signs: Vitals:   08/30/22 2049 08/30/22 2244  BP: 110/60 121/72  Pulse: 64 65  Resp: 18 17  Temp: 98.2 F (36.8 C)   SpO2: 98% 98%     General: Awake, no distress.  CV:  Good peripheral perfusion.  Resp:  Normal effort.  Abd:  No distention.  Slight tenderness in the upper and lower abdomen.  No rebound, no guarding Other:  Rectal exam without any stool in the vault.   ED Results / Procedures /  Treatments   Labs (all labs ordered are listed, but only abnormal results are displayed) Labs Reviewed  COMPREHENSIVE METABOLIC PANEL - Abnormal; Notable for the following components:      Result Value   Glucose, Bld 130 (*)    All other components within normal limits  URINALYSIS, ROUTINE W REFLEX MICROSCOPIC - Abnormal; Notable for the following components:   Color, Urine STRAW (*)    APPearance CLEAR (*)    Specific Gravity, Urine 1.004 (*)    All other components within normal limits  LIPASE, BLOOD  CBC WITH DIFFERENTIAL/PLATELET  TROPONIN I (HIGH SENSITIVITY)  TROPONIN I (HIGH SENSITIVITY)     EKG  My interpretation of EKG:  Normal sinus rhythm 66 ST elevation or T wave inversions, normal intervals  RADIOLOGY I have reviewed the xray personally and interpreted no pneumonia  PROCEDURES:  Critical Care performed: No  .1-3 Lead EKG Interpretation  Performed by: Concha Se, MD Authorized by: Concha Se, MD     Interpretation: normal     ECG rate:  60   ECG rate assessment: normal     Rhythm: sinus rhythm     Ectopy: none     Conduction: normal      MEDICATIONS ORDERED IN ED: Medications  pantoprazole (PROTONIX) injection 40 mg (40 mg Intravenous Given 08/31/22 0026)  metoCLOPramide (REGLAN) injection 10 mg (10 mg Intravenous Given 08/31/22 0025)  diphenhydrAMINE (BENADRYL) injection 12.5 mg (12.5 mg Intravenous Given 08/31/22 0023)  sodium chloride 0.9 % bolus 1,000 mL (0 mLs Intravenous Stopped 08/31/22 0220)  iohexol (OMNIPAQUE) 350 MG/ML injection 100 mL (100 mLs Intravenous Contrast Given 08/31/22 0044)     IMPRESSION / MDM / ASSESSMENT AND PLAN / ED COURSE  I reviewed the triage vital signs and the nursing notes.   Patient's presentation is most consistent with acute presentation with potential threat to life or bodily function.   Patient comes in with chest pain abdominal pain, headaches with concerns for vomiting blood.  CT imaging ordered  evaluate for intracranial hemorrhage although does not sound like subarachnoid has history of headaches previously.  Will get CT dissection given but chest abdominal pain to make sure no evidence of free air, dissection etc.  I suspect this could be more likely a gastritis or ulcer given significant ibuprofen use and the vomiting blood episode.  Rectal exam without any stool in the vault to test.  IMPRESSION:  1. No acute intracranial CT findings.  2. Mild cerebral and slight cerebellar atrophy.    IMPRESSION: 1. Normal aorta and branch vessels. 2. Single prominent nonspecific subcarinal lymph node measuring 1.4 x 3.1 cm. No other adenopathy is seen. 3. Emphysema. 4. 4.7 cm mass of either nonaerated stool or a true mucosal mass in the cecum. Further evaluation is recommended. 5. Nonobstructive bilateral micronephrolithiasis. 6. Constipation and diverticulosis.  Discussed with patient incidental findings.  She reports normal colonoscopy a year ago.  We discussed different options but recommended admission given vomiting blood in the setting of significant ibuprofen use to ensure no additional episodes, trend hemoglobin and discussed with GI given abnormal CT imaging.  She expressed understanding and felt comfortable with this plan  The patient is on the cardiac monitor to evaluate for evidence of arrhythmia and/or significant heart rate changes.      FINAL CLINICAL IMPRESSION(S) / ED DIAGNOSES   Final diagnoses:  Upper GI bleed     Rx / DC Orders   ED Discharge Orders     None        Note:  This document was prepared using Dragon voice recognition software and may include unintentional dictation errors.   Concha Se, MD 08/31/22 [phone]

## 2022-08-30 NOTE — ED Triage Notes (Signed)
Pt to ED via POV c/o lower abd pain, chest pain and blood in vomit that started today at 3pm. Pt vomited today and noticed blood in vomit, "nickel sized". CP is in center of chest and left side. Pt denies any SOB, fevers

## 2022-08-31 ENCOUNTER — Inpatient Hospital Stay: Payer: BC Managed Care – PPO | Admitting: Anesthesiology

## 2022-08-31 ENCOUNTER — Other Ambulatory Visit: Payer: Self-pay

## 2022-08-31 ENCOUNTER — Encounter: Payer: Self-pay | Admitting: Family Medicine

## 2022-08-31 ENCOUNTER — Ambulatory Visit: Payer: BC Managed Care – PPO | Admitting: Urology

## 2022-08-31 ENCOUNTER — Encounter
Admission: EM | Disposition: A | Discharge status: Home / Self Care | Payer: Self-pay | Source: Home / Self Care | Attending: Family Medicine

## 2022-08-31 ENCOUNTER — Emergency Department: Payer: BC Managed Care – PPO

## 2022-08-31 DIAGNOSIS — F32A Depression, unspecified: Secondary | ICD-10-CM

## 2022-08-31 DIAGNOSIS — K922 Gastrointestinal hemorrhage, unspecified: Secondary | ICD-10-CM | POA: Diagnosis not present

## 2022-08-31 DIAGNOSIS — R319 Hematuria, unspecified: Secondary | ICD-10-CM

## 2022-08-31 DIAGNOSIS — K219 Gastro-esophageal reflux disease without esophagitis: Secondary | ICD-10-CM

## 2022-08-31 DIAGNOSIS — R935 Abnormal findings on diagnostic imaging of other abdominal regions, including retroperitoneum: Secondary | ICD-10-CM

## 2022-08-31 DIAGNOSIS — N319 Neuromuscular dysfunction of bladder, unspecified: Secondary | ICD-10-CM

## 2022-08-31 DIAGNOSIS — R3989 Other symptoms and signs involving the genitourinary system: Secondary | ICD-10-CM

## 2022-08-31 HISTORY — PX: ESOPHAGOGASTRODUODENOSCOPY (EGD) WITH PROPOFOL: SHX5813

## 2022-08-31 LAB — CBC
HCT: 37.1 % (ref 36.0–46.0)
Hemoglobin: 12.1 g/dL (ref 12.0–15.0)
MCH: 29.9 pg (ref 26.0–34.0)
MCHC: 32.6 g/dL (ref 30.0–36.0)
MCV: 91.6 fL (ref 80.0–100.0)
Platelets: 180 10*3/uL (ref 150–400)
RBC: 4.05 MIL/uL (ref 3.87–5.11)
RDW: 12.5 % (ref 11.5–15.5)
WBC: 4.9 10*3/uL (ref 4.0–10.5)
nRBC: 0 % (ref 0.0–0.2)

## 2022-08-31 LAB — BASIC METABOLIC PANEL
Anion gap: 7 (ref 5–15)
BUN: 14 mg/dL (ref 6–20)
CO2: 25 mmol/L (ref 22–32)
Calcium: 8.8 mg/dL — ABNORMAL LOW (ref 8.9–10.3)
Chloride: 107 mmol/L (ref 98–111)
Creatinine, Ser: 0.64 mg/dL (ref 0.44–1.00)
GFR, Estimated: >60 mL/min (ref >60)
Glucose, Bld: 96 mg/dL (ref 70–99)
Potassium: 3.2 mmol/L — ABNORMAL LOW (ref 3.5–5.1)
Sodium: 139 mmol/L (ref 135–145)

## 2022-08-31 LAB — TYPE AND SCREEN
ABO/RH(D): A POS
Antibody Screen: NEGATIVE

## 2022-08-31 SURGERY — ESOPHAGOGASTRODUODENOSCOPY (EGD) WITH PROPOFOL
Anesthesia: General

## 2022-08-31 MED ORDER — ZINC OXIDE 15 MG PO TBDP: ORAL_TABLET | Freq: Every day | ORAL | Status: DC

## 2022-08-31 MED ORDER — DIPHENHYDRAMINE HCL 50 MG/ML IJ SOLN
12.5000 mg | Freq: Once | INTRAMUSCULAR | Status: AC
  Administered 2022-08-31: 12.5 mg via INTRAVENOUS
  Filled 2022-08-31: qty 1

## 2022-08-31 MED ORDER — PANTOPRAZOLE SODIUM 40 MG IV SOLR
40.0000 mg | Freq: Once | INTRAVENOUS | Status: AC
  Administered 2022-08-31: 40 mg via INTRAVENOUS
  Filled 2022-08-31: qty 10

## 2022-08-31 MED ORDER — SODIUM CHLORIDE 0.9 % IV BOLUS
1000.0000 mL | Freq: Once | INTRAVENOUS | Status: AC
  Administered 2022-08-31: 1000 mL via INTRAVENOUS

## 2022-08-31 MED ORDER — ONDANSETRON HCL 4 MG PO TABS: 4.0000 mg | ORAL_TABLET | Freq: Four times a day (QID) | ORAL | Status: DC | PRN

## 2022-08-31 MED ORDER — LIDOCAINE HCL (CARDIAC) PF 100 MG/5ML IV SOSY
PREFILLED_SYRINGE | INTRAVENOUS | Status: DC | PRN
  Administered 2022-08-31: 50 mg via INTRAVENOUS

## 2022-08-31 MED ORDER — SODIUM CHLORIDE 0.9 % IV SOLN: INTRAVENOUS | Status: DC

## 2022-08-31 MED ORDER — ACETAMINOPHEN 650 MG RE SUPP: 650.0000 mg | Freq: Four times a day (QID) | RECTAL | Status: DC | PRN

## 2022-08-31 MED ORDER — TRAZODONE HCL 50 MG PO TABS: 25.0000 mg | ORAL_TABLET | Freq: Every evening | ORAL | Status: DC | PRN

## 2022-08-31 MED ORDER — ADULT MULTIVITAMIN W/MINERALS CH
1.0000 | ORAL_TABLET | Freq: Every day | ORAL | Status: DC
  Administered 2022-08-31: 1 via ORAL
  Filled 2022-08-31: qty 1

## 2022-08-31 MED ORDER — TRAMADOL HCL 50 MG PO TABS: 50.0000 mg | ORAL_TABLET | Freq: Three times a day (TID) | ORAL | 0 refills | Status: DC | PRN

## 2022-08-31 MED ORDER — PROPOFOL 10 MG/ML IV BOLUS
INTRAVENOUS | Status: DC | PRN
  Administered 2022-08-31: 50 mg via INTRAVENOUS

## 2022-08-31 MED ORDER — TRAMADOL HCL 50 MG PO TABS
50.0000 mg | ORAL_TABLET | Freq: Three times a day (TID) | ORAL | 0 refills | Status: DC | PRN
  Filled 2022-08-31: qty 15, 5d supply, fill #0

## 2022-08-31 MED ORDER — PANTOPRAZOLE SODIUM 40 MG PO TBEC
40.0000 mg | DELAYED_RELEASE_TABLET | Freq: Two times a day (BID) | ORAL | 1 refills | Status: DC
  Filled 2022-08-31: qty 60, 30d supply, fill #0

## 2022-08-31 MED ORDER — PSYLLIUM 95 % PO PACK
1.0000 | PACK | Freq: Every day | ORAL | Status: DC
  Filled 2022-08-31: qty 1

## 2022-08-31 MED ORDER — ESCITALOPRAM OXALATE 10 MG PO TABS: 10.0000 mg | ORAL_TABLET | Freq: Every day | ORAL | Status: DC

## 2022-08-31 MED ORDER — RANOLAZINE ER 500 MG PO TB12: 500.0000 mg | ORAL_TABLET | Freq: Two times a day (BID) | ORAL | Status: DC

## 2022-08-31 MED ORDER — AZELASTINE HCL 0.1 % NA SOLN: 2.0000 | Freq: Two times a day (BID) | NASAL | Status: DC | PRN

## 2022-08-31 MED ORDER — ONDANSETRON HCL 4 MG/2ML IJ SOLN: 4.0000 mg | Freq: Four times a day (QID) | INTRAMUSCULAR | Status: DC | PRN

## 2022-08-31 MED ORDER — TRAMADOL HCL 50 MG PO TABS: 50.0000 mg | ORAL_TABLET | Freq: Three times a day (TID) | ORAL | Status: DC | PRN

## 2022-08-31 MED ORDER — POLYETHYLENE GLYCOL 3350 17 G PO PACK
17.0000 g | PACK | Freq: Every day | ORAL | Status: DC
  Administered 2022-08-31: 17 g via ORAL
  Filled 2022-08-31: qty 1

## 2022-08-31 MED ORDER — PROPOFOL 500 MG/50ML IV EMUL
INTRAVENOUS | Status: DC | PRN
  Administered 2022-08-31: 150 ug/kg/min via INTRAVENOUS

## 2022-08-31 MED ORDER — PANTOPRAZOLE SODIUM 40 MG PO TBEC
40.0000 mg | DELAYED_RELEASE_TABLET | Freq: Two times a day (BID) | ORAL | Status: DC
  Administered 2022-08-31: 40 mg via ORAL
  Filled 2022-08-31: qty 1

## 2022-08-31 MED ORDER — NITROGLYCERIN 0.4 MG SL SUBL: 0.4000 mg | SUBLINGUAL_TABLET | SUBLINGUAL | Status: DC | PRN

## 2022-08-31 MED ORDER — ACETAMINOPHEN 325 MG PO TABS
650.0000 mg | ORAL_TABLET | Freq: Four times a day (QID) | ORAL | Status: DC | PRN
  Administered 2022-08-31: 650 mg via ORAL
  Filled 2022-08-31: qty 2

## 2022-08-31 MED ORDER — PANTOPRAZOLE SODIUM 40 MG PO TBEC: 40.0000 mg | DELAYED_RELEASE_TABLET | Freq: Two times a day (BID) | ORAL | 1 refills | Status: AC

## 2022-08-31 MED ORDER — CALCIUM CARBONATE 1250 (500 CA) MG PO TABS
2.0000 | ORAL_TABLET | Freq: Every day | ORAL | Status: DC
  Administered 2022-08-31: 2500 mg via ORAL
  Filled 2022-08-31: qty 2

## 2022-08-31 MED ORDER — PANTOPRAZOLE SODIUM 40 MG IV SOLR
40.0000 mg | Freq: Two times a day (BID) | INTRAVENOUS | Status: DC
  Administered 2022-08-31: 40 mg via INTRAVENOUS
  Filled 2022-08-31: qty 10

## 2022-08-31 MED ORDER — VITAMIN D 25 MCG (1000 UNIT) PO TABS
1000.0000 [IU] | ORAL_TABLET | Freq: Every day | ORAL | Status: DC
  Administered 2022-08-31: 1000 [IU] via ORAL
  Filled 2022-08-31 (×2): qty 1

## 2022-08-31 MED ORDER — RISAQUAD PO CAPS
1.0000 | ORAL_CAPSULE | Freq: Every day | ORAL | Status: DC
  Administered 2022-08-31: 1 via ORAL
  Filled 2022-08-31: qty 1

## 2022-08-31 MED ORDER — POTASSIUM CHLORIDE CRYS ER 20 MEQ PO TBCR
40.0000 meq | EXTENDED_RELEASE_TABLET | Freq: Once | ORAL | Status: AC
  Administered 2022-08-31: 40 meq via ORAL
  Filled 2022-08-31: qty 2

## 2022-08-31 MED ORDER — IOHEXOL 350 MG/ML SOLN
100.0000 mL | Freq: Once | INTRAVENOUS | Status: AC | PRN
  Administered 2022-08-31: 100 mL via INTRAVENOUS

## 2022-08-31 MED ORDER — METOCLOPRAMIDE HCL 5 MG/ML IJ SOLN
10.0000 mg | Freq: Once | INTRAMUSCULAR | Status: AC
  Administered 2022-08-31: 10 mg via INTRAVENOUS
  Filled 2022-08-31: qty 2

## 2022-08-31 NOTE — Consult Note (Signed)
GI Inpatient Consult Note  Reason for Consult: GI bleeding   Attending Requesting Consult: Dr. Arville Care  History of Present Illness: Cristina Wade is a 52 y.o. female seen for evaluation of GI bleeding at the request of Dr. Arville Care. PMHx of asthma, GERD, anxiety, migraine, chronic low back pain and seasonal allergies.  Pt reports that had an ankle fracture May 3rd and began taking high doses of ibuprofen routinely at 800 mg 3 times a day. She endorses she had some mild abdominal discomfort over the past several days that she felt was likely related to constipation. Describes a pressure and cramping/aching sensation over her entire abdomen. Yesterday she had an episode of vomiting where she had 5-6 nickel sized pieces of light red blood in the emesis. She had not felt a lot of nausea prior to the vomiting. She denies significant GERD symptoms chronically but does endorse she has had some mild chest discomfort over the past 2 to 3 months that she wonders if was reflux related. She reports having dysphagia mainly to corn in her throat area over the past couple months, will pass w/ repeated swallows. She denies any pill or liquid dysphagia.  Endorses some issues with constipation particularly over the past few weeks. She endorses using miralax half dose initially then increased to a full dose over past week for continued constipation and abd discomfort. She endorses some BRB w/ wiping since the constipation began. No dark stools. She endorses that the constipation has improved with the miralax. Lower abd pain improves some when able to move stool. Reports family hx colon cancer in her father at age 34   She denies tobacco & alcohol. IBU 800 mg TID routinely since 08/18/22.   Past Medical History:  Past Medical History:  Diagnosis Date   Anxiety    Asthma    Chronic pelvic pain in female 2018   Cystitis    Cystocele    Endometrial polyp    Endometriosis    per pt report but never seen during surg    Frequent headaches    Gastritis    GERD (gastroesophageal reflux disease)    rare   Gross hematuria    History of kidney stones    Microscopic hematuria    Migraine    Migraines    Neuropathy    Osteopenia    Urinary disorder    UTI (lower urinary tract infection)     Problem List: Patient Active Problem List   Diagnosis Date Noted   Upper GI bleeding 08/31/2022   Depression 08/31/2022   Chronic GERD 08/31/2022   Upper GI bleed 08/31/2022   Abnormal abdominal CT scan 08/31/2022   Subacute thyroiditis 03/08/2022   Thyroid antibody positive 03/08/2022   Epigastric pain 12/09/2021   Nausea 12/09/2021   Hyperthyroidism 12/09/2021   Neck pain 11/30/2021   Vasomotor flushing 11/30/2021   Chills (without fever) 04/15/2021   Dysuria 08/20/2020   Neurogenic bladder 08/20/2020   Pelvic pain in female 12/25/2019   Chronic low back pain 12/25/2019   Chronic venous insufficiency 12/24/2019   Lymphedema 12/24/2019   Lower extremity edema 12/03/2019   HLD (hyperlipidemia) 10/10/2019   Low back pain radiating to left leg 10/04/2019   Dyspnea on exertion 09/20/2019   Orthopnea 09/20/2019   Chronic abdominal pain 05/14/2019   Irritable bowel syndrome with constipation 05/14/2019   Chronic constipation 12/25/2018   Urinary retention 08/12/2018   Anxiety 08/12/2018   Elevated blood pressure reading without diagnosis of hypertension 07/12/2018  Left leg pain 07/12/2018   Acute left-sided low back pain 06/03/2018   Cloudy urine 04/29/2018   Chest pain 04/29/2018   Headache 04/27/2018   Localized, primary osteoarthritis 02/07/2018   Pelvic pain 01/30/2018   Slow transit constipation 10/10/2017   Bilateral impacted cerumen 11/28/2016   Chronic bilateral low back pain without sciatica 11/28/2016   Precordial pain 07/07/2016   Right bundle branch block (RBBB) on electrocardiogram (ECG) 07/07/2016   Chest tightness 06/27/2016   Preventative health care 09/30/2015   Encounter to  establish care 09/28/2014   Vestibular migraine 09/28/2014   Routine physical examination 11/21/2013    Past Surgical History: Past Surgical History:  Procedure Laterality Date   COLONOSCOPY N/A 11/05/2014   Procedure: COLONOSCOPY;  Surgeon: Scot Jun, MD;  Location: [facility] ENDOSCOPY;  Service: Endoscopy;  Laterality: N/A;   COLONOSCOPY  11/2016   Dr. Mechele Collin   CYSTOSCOPY  2007   with biopsy    CYSTOSCOPY N/A 06/01/2016   Procedure: CYSTOSCOPY;  Surgeon: Vena Austria, MD;  Location: [facility] ORS;  Service: Gynecology;  Laterality: N/A;   DIAGNOSTIC LAPAROSCOPY     DILATATION & CURETTAGE/HYSTEROSCOPY WITH MYOSURE N/A 04/13/2015   Procedure: DILATATION & CURETTAGE/HYSTEROSCOPY WITH MYOSURE/POLYPECTOMY;  Surgeon: Elenora Fender Ward, MD;  Location: [facility] ORS;  Service: Gynecology;  Laterality: N/A;   DILATATION & CURETTAGE/HYSTEROSCOPY WITH MYOSURE N/A 06/01/2016   Procedure: DILATATION & CURETTAGE/HYSTEROSCOPY WITH MYOSURE;  Surgeon: Vena Austria, MD;  Location: [facility] ORS;  Service: Gynecology;  Laterality: N/A;   GUM SURGERY     laproscopy  2007   POLYPECTOMY     endometrial    Allergies: Allergies  Allergen Reactions   Milk (Cow) Other (See Comments)   Milk-Related Compounds    Omeprazole Other (See Comments)    Pt reports "Chest pain, HA and Dysuria." 01/05/22 patient states she is able to take omeprazole now without a reaction   Penicillins Itching and Rash    Home Medications: Facility-Administered Medications Prior to Admission  Medication Dose Route Frequency Provider Last Rate Last Admin   lidocaine (XYLOCAINE) 2 % jelly 1 application  1 application  Urethral Once Sondra Come, MD       Medications Prior to Admission  Medication Sig Dispense Refill Last Dose   Azelastine HCl 137 MCG/SPRAY SOLN SMARTSIG:2 Spray(s) Both Nares Twice Daily PRN   prn   Calcium Carbonate-Vit D-Min (CALCIUM 1200 PO) Take 2-3 tablets by mouth daily. Reported on 05/26/2015   08/30/2022    cholecalciferol (VITAMIN D3) 10 MCG (400 UNIT) TABS tablet Take 400 Units by mouth daily.   08/30/2022   mometasone (NASONEX) 50 MCG/ACT nasal spray 2 sprays daily as needed.   prn   Multiple Vitamin (MULTIVITAMIN ADULT PO) Take 1 tablet by mouth daily.   08/30/2022   nitrofurantoin, macrocrystal-monohydrate, (MACROBID) 100 MG capsule Take 1 capsule (100 mg total) by mouth 2 (two) times daily. 14 capsule 0 08/30/2022 at 1830   nitroGLYCERIN (NITROSTAT) 0.4 MG SL tablet Place 1 tablet (0.4 mg total) under the tongue every 5 (five) minutes as needed for up to 25 days for chest pain (Maximum of 3 doses.). 25 tablet 0 prn   polyethylene glycol (MIRALAX / GLYCOLAX) 17 g packet Take 17 g by mouth daily.   08/30/2022   Probiotic Product (PROBIOTIC DAILY PO) Take 1 tablet by mouth daily.   08/30/2022   psyllium (REGULOID) 0.52 g capsule Take 0.52 g by mouth daily.   08/30/2022   traMADol (ULTRAM) 50 MG tablet  Take 50 mg by mouth 3 (three) times daily as needed.   prn   ZINC OXIDE PO Take 1 tablet by mouth daily.   08/30/2022   escitalopram (LEXAPRO) 10 MG tablet Take 1 tablet by mouth once daily (Patient not taking: Reported on 08/31/2022) 30 tablet 2 Not Taking   naproxen (NAPROSYN) 250 MG tablet Take 1 tablet (250 mg total) by mouth 2 (two) times daily with a meal. (Patient not taking: Reported on 08/31/2022)   Not Taking   omeprazole (PRILOSEC) 20 MG capsule Take 1 capsule (20 mg total) by mouth daily. (Patient not taking: Reported on 08/31/2022) 30 capsule 1 Not Taking   Povidone-Iodine 7.5 % SWAB Apply 1 applicator topically as needed. (Patient not taking: Reported on 08/31/2022) 50 each 3 Not Taking   ranolazine (RANEXA) 500 MG 12 hr tablet Take 1 tablet (500 mg total) by mouth 2 (two) times daily. (Patient not taking: Reported on 08/31/2022) 60 tablet 0 Not Taking   Home medication reconciliation was completed with the patient.   Scheduled Inpatient Medications:    acidophilus  1 capsule Oral Daily   calcium  carbonate  2 tablet Oral Q breakfast   cholecalciferol  1,000 Units Oral Daily   multivitamin with minerals  1 tablet Oral Daily   pantoprazole (PROTONIX) IV  40 mg Intravenous Q12H   polyethylene glycol  17 g Oral Daily   psyllium  1 packet Oral Daily    Continuous Inpatient Infusions:    sodium chloride 100 mL/hr at 08/31/22 0604    PRN Inpatient Medications:  acetaminophen **OR** acetaminophen, azelastine, nitroGLYCERIN, ondansetron **OR** ondansetron (ZOFRAN) IV, traMADol, traZODone  Family History: family history includes Arthritis in her father and mother; Breast cancer (age of onset: 25) in her cousin; Cancer in her maternal grandmother and paternal grandfather; Cancer - Colon (age of onset: 35) in her father; Colon cancer (age of onset: 49) in her father; Diabetes in her mother; Heart disease in her mother; Heart failure in her mother; Hyperlipidemia in her father and mother; Hypertension in her mother; Kidney cancer (age of onset: 28) in her mother; Prostate cancer (age of onset: 50) in her father; Stroke in her mother; Transient ischemic attack in her father; Valvular heart disease in her father.    Social History:   reports that she has never smoked. She has never used smokeless tobacco. She reports that she does not drink alcohol and does not use drugs.   Review of Systems: Constitutional: Weight is stable.  Eyes: No changes in vision. ENT: No oral lesions, sore throat.  GI: see HPI.  Heme/Lymph: No easy bruising.  CV: No chest pain.  GU: No hematuria.  Integumentary: No rashes.  Neuro: No headaches.  Psych: No depression/anxiety.  Endocrine: No heat/cold intolerance.  Allergic/Immunologic: No urticaria.  Resp: No cough, SOB.  Musculoskeletal: No joint swelling.    Physical Examination: BP 136/81 (BP Location: Left Arm)   Pulse (!) 58   Temp 98.5 F (36.9 C)   Resp 18   Ht 5\' 10"  (1.778 m)   Wt 57.2 kg   LMP 11/03/2021 (Exact Date)   SpO2 100%   BMI 18.08  kg/m  Gen: NAD, alert and oriented x 4 HEENT: PEERLA, EOMI, Neck: supple, no JVD or thyromegaly Chest: CTA bilaterally, no wheezes, crackles, or other adventitious sounds CV: RRR, no m/g/c/r Abd: soft, mild TTP entire abd, ND, +BS in all four quadrants; no HSM, guarding, ridigity, or rebound tenderness Ext: ankle boot in place  Skin: no rash or lesions noted Lymph: no LAD  Data: Lab Results  Component Value Date   WBC 4.9 08/31/2022   HGB 12.1 08/31/2022   HCT 37.1 08/31/2022   MCV 91.6 08/31/2022   PLT 180 08/31/2022   Recent Labs  Lab 08/30/22 2053 08/31/22 0630  HGB 13.1 12.1   Lab Results  Component Value Date   NA 139 08/31/2022   K 3.2 (L) 08/31/2022   CL 107 08/31/2022   CO2 25 08/31/2022   BUN 14 08/31/2022   CREATININE 0.64 08/31/2022   Lab Results  Component Value Date   ALT 21 08/30/2022   AST 23 08/30/2022   ALKPHOS 55 08/30/2022   BILITOT 0.5 08/30/2022   No results for input(s): "APTT", "INR", "PTT" in the last 168 hours.  CT chest/a/p -   IMPRESSION: 1. Normal aorta and branch vessels. 2. Single prominent nonspecific subcarinal lymph node measuring 1.4 x 3.1 cm. No other adenopathy is seen. 3. Emphysema. 4. 4.7 cm mass of either nonaerated stool or a true mucosal mass in the cecum. Further evaluation is recommended. 5. Nonobstructive bilateral micronephrolithiasis. 6. Constipation and diverticulosis. Emphysema (ICD10-J43.9).   EGD June 2023-normal esophagus, stomach, duodenum.  Colonoscopy 2023-Good prep, IC valve visualized, 1 mm polyp transverse colon.  5-year repeat recommended. Pathology with benign lymphoid aggregate.  Assessment/Plan: Ms. Locklear is a 52 y.o. female admitted for hematemesis.  Hematemesis - one episode yesterday, does have recent NSAID use daily since ankle fracture. Suspect gastritis, PUD, etc. She is on PPI BID IV. Hgb has been stable. Will plan for EGD today for further evaluation.  Abnormal CT - 4.7 cm mass vs. stool  on CT, given had colonoscopy last year with good prep and IC valve visualized suspect that lesion seen on CT is stool but can f/up with outpatient repeat CT in a few weeks to ensure it has resolved. Constipation - continue miralax   Recommendations: EGD today  NPO until procedure  PPI BID IV  Consider outpatient CT scan for follow up of CT finding, felt unlikely cecal mass Monitor H/H  Case was discussed with Dr. Timothy Lasso. Thank you for the consult. Please call with questions or concerns.  Ardelia Mems, PA-C [facility]

## 2022-08-31 NOTE — Anesthesia Postprocedure Evaluation (Signed)
Anesthesia Post Note  Patient: Cristina Wade  Procedure(s) Performed: ESOPHAGOGASTRODUODENOSCOPY (EGD) WITH PROPOFOL  Patient location during evaluation: PACU Anesthesia Type: General Level of consciousness: awake and alert Pain management: pain level controlled Vital Signs Assessment: post-procedure vital signs reviewed and stable Respiratory status: spontaneous breathing, nonlabored ventilation, respiratory function stable and patient connected to nasal cannula oxygen Cardiovascular status: blood pressure returned to baseline and stable Postop Assessment: no apparent nausea or vomiting Anesthetic complications: no   No notable events documented.   Last Vitals:  Vitals:   08/31/22 1312 08/31/22 1322  BP: 136/87 (!) 156/81  Pulse:  (!) 58  Resp:    Temp:  36.6 C  SpO2:  100%    Last Pain:  Vitals:   08/31/22 1322  TempSrc: Oral  PainSc: 0-No pain                 Corinda Gubler

## 2022-08-31 NOTE — Op Note (Signed)
[facility] Patient Name: Cristina Wade Procedure Date: 08/31/2022 12:35 PM MRN: [id number] Account #: [id number] Date of Birth: [birth year] Admit Type: Inpatient Age: 52 Room: [facility] ENDO ROOM 1 Gender: Female Note Status: Finalized Instrument Name: Patton Salles Endoscope [id number] Procedure:             Upper GI endoscopy Indications:           Hematemesis Providers:             Trenda Moots, DO Referring MD:          Lyn Records. Arnett (Referring MD) Medicines:             Monitored Anesthesia Care Complications:         No immediate complications. Estimated blood loss:                         Minimal. Procedure:             Pre-Anesthesia Assessment:                        - Prior to the procedure, a History and Physical was                         performed, and patient medications and allergies were                         reviewed. The patient is competent. The risks and                         benefits of the procedure and the sedation options and                         risks were discussed with the patient. All questions                         were answered and informed consent was obtained.                         Patient identification and proposed procedure were                         verified by the physician, the nurse, the anesthetist                         and the technician in the endoscopy suite. Mental                         Status Examination: alert and oriented. Airway                         Examination: normal oropharyngeal airway and neck                         mobility. Respiratory Examination: clear to                         auscultation. CV Examination: RRR, no murmurs, no S3  or S4. Prophylactic Antibiotics: The patient does not                         require prophylactic antibiotics. Prior                         Anticoagulants: The patient has taken no anticoagulant                          or antiplatelet agents. ASA Grade Assessment: III - A                         patient with severe systemic disease. After reviewing                         the risks and benefits, the patient was deemed in                         satisfactory condition to undergo the procedure. The                         anesthesia plan was to use monitored anesthesia care                         (MAC). Immediately prior to administration of                         medications, the patient was re-assessed for adequacy                         to receive sedatives. The heart rate, respiratory                         rate, oxygen saturations, blood pressure, adequacy of                         pulmonary ventilation, and response to care were                         monitored throughout the procedure. The physical                         status of the patient was re-assessed after the                         procedure.                        After obtaining informed consent, the endoscope was                         passed under direct vision. Throughout the procedure,                         the patient's blood pressure, pulse, and oxygen                         saturations were monitored continuously. The Endoscope  was introduced through the mouth, and advanced to the                         second part of duodenum. The upper GI endoscopy was                         accomplished without difficulty. The patient tolerated                         the procedure well. Findings:      Localized mildly erythematous mucosa without active bleeding and with no       stigmata of bleeding was found in the duodenal bulb. Estimated blood       loss: none.      Esophagogastric landmarks were identified: the gastroesophageal junction       was found at 40 cm from the incisors.      The Z-line was regular. Estimated blood loss: none.      The exam of the esophagus was otherwise normal.       Scattered mild inflammation characterized by erosions and erythema was       found in the entire examined stomach. Biopsies were taken with a cold       forceps for Helicobacter pylori testing. Estimated blood loss was       minimal.      Hematin (altered blood/coffee-ground-like material) was found on the       greater curvature of the stomach. Estimated blood loss: none.      The exam of the stomach was otherwise normal. Impression:            - Erythematous duodenopathy.                        - Esophagogastric landmarks identified.                        - Z-line regular.                        - Gastritis. Biopsied.                        - Hematin (altered blood/coffee-ground-like material)                         in the greater curvature of the stomach. Recommendation:        - Patient has a contact number available for                         emergencies. The signs and symptoms of potential                         delayed complications were discussed with the patient.                         Return to normal activities tomorrow. Written                         discharge instructions were provided to the patient.                        -  Return patient to hospital ward for ongoing care.                        - Advance diet as tolerated.                        - Continue present medications.                        - No ibuprofen, naproxen, or other non-steroidal                         anti-inflammatory drugs.                        - Use Prilosec (omeprazole) 40 mg PO BID for 8 weeks.                        - Await pathology results.                        - The findings and recommendations were discussed with                         the patient.                        - The findings and recommendations were discussed with                         the referring physician. Procedure Code(s):     --- Professional ---                        [phone], Esophagogastroduodenoscopy,  flexible,                         transoral; with biopsy, single or multiple Diagnosis Code(s):     --- Professional ---                        K31.89, Other diseases of stomach and duodenum                        K29.70, Gastritis, unspecified, without bleeding                        K92.2, Gastrointestinal hemorrhage, unspecified                        K92.0, Hematemesis CPT copyright 2022 [employer]. All rights reserved. The codes documented in this report are preliminary and upon coder review may  be revised to meet current compliance requirements. Attending Participation:      I personally performed the entire procedure. Elfredia Nevins, DO Jaynie Collins DO, DO 08/31/2022 1:03:40 PM This report has been signed electronically. Number of Addenda: 0 Note Initiated On: 08/31/2022 12:35 PM Estimated Blood Loss:  Estimated blood loss was minimal.      [facility]

## 2022-08-31 NOTE — H&P (View-Only) (Signed)
 GI Inpatient Consult Note  Reason for Consult: GI bleeding   Attending Requesting Consult: Dr. Mansy  History of Present Illness: Cristina Wade is a 51 y.o. female seen for evaluation of GI bleeding at the request of Dr. Mansy. PMHx of asthma, GERD, anxiety, migraine, chronic low back pain and seasonal allergies.  Pt reports that had an ankle fracture May 3rd and began taking high doses of ibuprofen routinely at 800 mg 3 times a day. She endorses she had some mild abdominal discomfort over the past several days that she felt was likely related to constipation. Describes a pressure and cramping/aching sensation over her entire abdomen. Yesterday she had an episode of vomiting where she had 5-6 nickel sized pieces of light red blood in the emesis. She had not felt a lot of nausea prior to the vomiting. She denies significant GERD symptoms chronically but does endorse she has had some mild chest discomfort over the past 2 to 3 months that she wonders if was reflux related. She reports having dysphagia mainly to corn in her throat area over the past couple months, will pass w/ repeated swallows. She denies any pill or liquid dysphagia.  Endorses some issues with constipation particularly over the past few weeks. She endorses using miralax half dose initially then increased to a full dose over past week for continued constipation and abd discomfort. She endorses some BRB w/ wiping since the constipation began. No dark stools. She endorses that the constipation has improved with the miralax. Lower abd pain improves some when able to move stool. Reports family hx colon cancer in her father at age 50   She denies tobacco & alcohol. IBU 800 mg TID routinely since 08/18/22.   Past Medical History:  Past Medical History:  Diagnosis Date   Anxiety    Asthma    Chronic pelvic pain in female 2018   Cystitis    Cystocele    Endometrial polyp    Endometriosis    per pt report but never seen during surg    Frequent headaches    Gastritis    GERD (gastroesophageal reflux disease)    rare   Gross hematuria    History of kidney stones    Microscopic hematuria    Migraine    Migraines    Neuropathy    Osteopenia    Urinary disorder    UTI (lower urinary tract infection)     Problem List: Patient Active Problem List   Diagnosis Date Noted   Upper GI bleeding 08/31/2022   Depression 08/31/2022   Chronic GERD 08/31/2022   Upper GI bleed 08/31/2022   Abnormal abdominal CT scan 08/31/2022   Subacute thyroiditis 03/08/2022   Thyroid antibody positive 03/08/2022   Epigastric pain 12/09/2021   Nausea 12/09/2021   Hyperthyroidism 12/09/2021   Neck pain 11/30/2021   Vasomotor flushing 11/30/2021   Chills (without fever) 04/15/2021   Dysuria 08/20/2020   Neurogenic bladder 08/20/2020   Pelvic pain in female 12/25/2019   Chronic low back pain 12/25/2019   Chronic venous insufficiency 12/24/2019   Lymphedema 12/24/2019   Lower extremity edema 12/03/2019   HLD (hyperlipidemia) 10/10/2019   Low back pain radiating to left leg 10/04/2019   Dyspnea on exertion 09/20/2019   Orthopnea 09/20/2019   Chronic abdominal pain 05/14/2019   Irritable bowel syndrome with constipation 05/14/2019   Chronic constipation 12/25/2018   Urinary retention 08/12/2018   Anxiety 08/12/2018   Elevated blood pressure reading without diagnosis of hypertension 07/12/2018     Left leg pain 07/12/2018   Acute left-sided low back pain 06/03/2018   Cloudy urine 04/29/2018   Chest pain 04/29/2018   Headache 04/27/2018   Localized, primary osteoarthritis 02/07/2018   Pelvic pain 01/30/2018   Slow transit constipation 10/10/2017   Bilateral impacted cerumen 11/28/2016   Chronic bilateral low back pain without sciatica 11/28/2016   Precordial pain 07/07/2016   Right bundle branch block (RBBB) on electrocardiogram (ECG) 07/07/2016   Chest tightness 06/27/2016   Preventative health care 09/30/2015   Encounter to  establish care 09/28/2014   Vestibular migraine 09/28/2014   Routine physical examination 11/21/2013    Past Surgical History: Past Surgical History:  Procedure Laterality Date   COLONOSCOPY N/A 11/05/2014   Procedure: COLONOSCOPY;  Surgeon: Robert T Elliott, MD;  Location: [facility];  Service: Endoscopy;  Laterality: N/A;   COLONOSCOPY  11/2016   Dr. Elliott   CYSTOSCOPY  2007   with biopsy    CYSTOSCOPY N/A 06/01/2016   Procedure: CYSTOSCOPY;  Surgeon: Andreas Staebler, MD;  Location: [facility];  Service: Gynecology;  Laterality: N/A;   DIAGNOSTIC LAPAROSCOPY     DILATATION & CURETTAGE/HYSTEROSCOPY WITH MYOSURE N/A 04/13/2015   Procedure: DILATATION & CURETTAGE/HYSTEROSCOPY WITH MYOSURE/POLYPECTOMY;  Surgeon: Chelsea C Ward, MD;  Location: [facility];  Service: Gynecology;  Laterality: N/A;   DILATATION & CURETTAGE/HYSTEROSCOPY WITH MYOSURE N/A 06/01/2016   Procedure: DILATATION & CURETTAGE/HYSTEROSCOPY WITH MYOSURE;  Surgeon: Andreas Staebler, MD;  Location: [facility];  Service: Gynecology;  Laterality: N/A;   GUM SURGERY     laproscopy  2007   POLYPECTOMY     endometrial    Allergies: Allergies  Allergen Reactions   Milk (Cow) Other (See Comments)   Milk-Related Compounds    Omeprazole Other (See Comments)    Pt reports "Chest pain, HA and Dysuria." 01/05/22 patient states she is able to take omeprazole now without a reaction   Penicillins Itching and Rash    Home Medications: Facility-Administered Medications Prior to Admission  Medication Dose Route Frequency Provider Last Rate Last Admin   lidocaine (XYLOCAINE) 2 % jelly 1 application  1 application  Urethral Once Sninsky, Brian C, MD       Medications Prior to Admission  Medication Sig Dispense Refill Last Dose   Azelastine HCl 137 MCG/SPRAY SOLN SMARTSIG:2 Spray(s) Both Nares Twice Daily PRN   prn   Calcium Carbonate-Vit D-Min (CALCIUM 1200 PO) Take 2-3 tablets by mouth daily. Reported on 05/26/2015   08/30/2022    cholecalciferol (VITAMIN D3) 10 MCG (400 UNIT) TABS tablet Take 400 Units by mouth daily.   08/30/2022   mometasone (NASONEX) 50 MCG/ACT nasal spray 2 sprays daily as needed.   prn   Multiple Vitamin (MULTIVITAMIN ADULT PO) Take 1 tablet by mouth daily.   08/30/2022   nitrofurantoin, macrocrystal-monohydrate, (MACROBID) 100 MG capsule Take 1 capsule (100 mg total) by mouth 2 (two) times daily. 14 capsule 0 08/30/2022 at 1830   nitroGLYCERIN (NITROSTAT) 0.4 MG SL tablet Place 1 tablet (0.4 mg total) under the tongue every 5 (five) minutes as needed for up to 25 days for chest pain (Maximum of 3 doses.). 25 tablet 0 prn   polyethylene glycol (MIRALAX / GLYCOLAX) 17 g packet Take 17 g by mouth daily.   08/30/2022   Probiotic Product (PROBIOTIC DAILY PO) Take 1 tablet by mouth daily.   08/30/2022   psyllium (REGULOID) 0.52 g capsule Take 0.52 g by mouth daily.   08/30/2022   traMADol (ULTRAM) 50 MG tablet   Take 50 mg by mouth 3 (three) times daily as needed.   prn   ZINC OXIDE PO Take 1 tablet by mouth daily.   08/30/2022   escitalopram (LEXAPRO) 10 MG tablet Take 1 tablet by mouth once daily (Patient not taking: Reported on 08/31/2022) 30 tablet 2 Not Taking   naproxen (NAPROSYN) 250 MG tablet Take 1 tablet (250 mg total) by mouth 2 (two) times daily with a meal. (Patient not taking: Reported on 08/31/2022)   Not Taking   omeprazole (PRILOSEC) 20 MG capsule Take 1 capsule (20 mg total) by mouth daily. (Patient not taking: Reported on 08/31/2022) 30 capsule 1 Not Taking   Povidone-Iodine 7.5 % SWAB Apply 1 applicator topically as needed. (Patient not taking: Reported on 08/31/2022) 50 each 3 Not Taking   ranolazine (RANEXA) 500 MG 12 hr tablet Take 1 tablet (500 mg total) by mouth 2 (two) times daily. (Patient not taking: Reported on 08/31/2022) 60 tablet 0 Not Taking   Home medication reconciliation was completed with the patient.   Scheduled Inpatient Medications:    acidophilus  1 capsule Oral Daily   calcium  carbonate  2 tablet Oral Q breakfast   cholecalciferol  1,000 Units Oral Daily   multivitamin with minerals  1 tablet Oral Daily   pantoprazole (PROTONIX) IV  40 mg Intravenous Q12H   polyethylene glycol  17 g Oral Daily   psyllium  1 packet Oral Daily    Continuous Inpatient Infusions:    sodium chloride 100 mL/hr at 08/31/22 0604    PRN Inpatient Medications:  acetaminophen **OR** acetaminophen, azelastine, nitroGLYCERIN, ondansetron **OR** ondansetron (ZOFRAN) IV, traMADol, traZODone  Family History: family history includes Arthritis in her father and mother; Breast cancer (age of onset: 35) in her cousin; Cancer in her maternal grandmother and paternal grandfather; Cancer - Colon (age of onset: 55) in her father; Colon cancer (age of onset: 50) in her father; Diabetes in her mother; Heart disease in her mother; Heart failure in her mother; Hyperlipidemia in her father and mother; Hypertension in her mother; Kidney cancer (age of onset: 45) in her mother; Prostate cancer (age of onset: 68) in her father; Stroke in her mother; Transient ischemic attack in her father; Valvular heart disease in her father.    Social History:   reports that she has never smoked. She has never used smokeless tobacco. She reports that she does not drink alcohol and does not use drugs.   Review of Systems: Constitutional: Weight is stable.  Eyes: No changes in vision. ENT: No oral lesions, sore throat.  GI: see HPI.  Heme/Lymph: No easy bruising.  CV: No chest pain.  GU: No hematuria.  Integumentary: No rashes.  Neuro: No headaches.  Psych: No depression/anxiety.  Endocrine: No heat/cold intolerance.  Allergic/Immunologic: No urticaria.  Resp: No cough, SOB.  Musculoskeletal: No joint swelling.    Physical Examination: BP 136/81 (BP Location: Left Arm)   Pulse (!) 58   Temp 98.5 F (36.9 C)   Resp 18   Ht 5' 10" (1.778 m)   Wt 57.2 kg   LMP 11/03/2021 (Exact Date)   SpO2 100%   BMI 18.08  kg/m  Gen: NAD, alert and oriented x 4 HEENT: PEERLA, EOMI, Neck: supple, no JVD or thyromegaly Chest: CTA bilaterally, no wheezes, crackles, or other adventitious sounds CV: RRR, no m/g/c/r Abd: soft, mild TTP entire abd, ND, +BS in all four quadrants; no HSM, guarding, ridigity, or rebound tenderness Ext: ankle boot in place   Skin: no rash or lesions noted Lymph: no LAD  Data: Lab Results  Component Value Date   WBC 4.9 08/31/2022   HGB 12.1 08/31/2022   HCT 37.1 08/31/2022   MCV 91.6 08/31/2022   PLT 180 08/31/2022   Recent Labs  Lab 08/30/22 2053 08/31/22 0630  HGB 13.1 12.1   Lab Results  Component Value Date   NA 139 08/31/2022   K 3.2 (L) 08/31/2022   CL 107 08/31/2022   CO2 25 08/31/2022   BUN 14 08/31/2022   CREATININE 0.64 08/31/2022   Lab Results  Component Value Date   ALT 21 08/30/2022   AST 23 08/30/2022   ALKPHOS 55 08/30/2022   BILITOT 0.5 08/30/2022   No results for input(s): "APTT", "INR", "PTT" in the last 168 hours.  CT chest/a/p -   IMPRESSION: 1. Normal aorta and branch vessels. 2. Single prominent nonspecific subcarinal lymph node measuring 1.4 x 3.1 cm. No other adenopathy is seen. 3. Emphysema. 4. 4.7 cm mass of either nonaerated stool or a true mucosal mass in the cecum. Further evaluation is recommended. 5. Nonobstructive bilateral micronephrolithiasis. 6. Constipation and diverticulosis. Emphysema (ICD10-J43.9).   EGD June 2023-normal esophagus, stomach, duodenum.  Colonoscopy 2023-Good prep, IC valve visualized, 1 mm polyp transverse colon.  5-year repeat recommended. Pathology with benign lymphoid aggregate.  Assessment/Plan: Cristina Wade is a 51 y.o. female admitted for hematemesis.  Hematemesis - one episode yesterday, does have recent NSAID use daily since ankle fracture. Suspect gastritis, PUD, etc. She is on PPI BID IV. Hgb has been stable. Will plan for EGD today for further evaluation.  Abnormal CT - 4.7 cm mass vs. stool  on CT, given had colonoscopy last year with good prep and IC valve visualized suspect that lesion seen on CT is stool but can f/up with outpatient repeat CT in a few weeks to ensure it has resolved. Constipation - continue miralax   Recommendations: EGD today  NPO until procedure  PPI BID IV  Consider outpatient CT scan for follow up of CT finding, felt unlikely cecal mass Monitor H/H  Case was discussed with Dr. Russo. Thank you for the consult. Please call with questions or concerns.  Marifer Hurd B Henretter Piekarski, PA-C [facility]  

## 2022-08-31 NOTE — Assessment & Plan Note (Addendum)
-   Differential diagnosis would include Mallory-Weiss tear with her vomiting, acute gastritis or duodenitis, gastric or duodenal erosions or ulcers or AVMs. - The patient will be admitted to a medical telemetry bed. - We will continue her on IV Protonix. - Her ibuprofen will be stopped. - We will follow serial H&H.   - GI consult will be obtained. - I notified Dr. Norma Fredrickson about the patient.

## 2022-08-31 NOTE — Assessment & Plan Note (Signed)
-   The patient will be placed on IV PPI as mentioned above. 

## 2022-08-31 NOTE — H&P (Signed)
[place]   PATIENT NAME: Cristina Wade    MR#:  [id number]  DATE OF BIRTH:  07/25/1970  DATE OF ADMISSION:  08/30/2022  PRIMARY CARE PHYSICIAN: [name], FNP   Patient is coming from: Home  REQUESTING/REFERRING PHYSICIAN: Artis Delay, MD  CHIEF COMPLAINT:   Chief Complaint  Patient presents with   Chest Pain   Hematemesis   Abdominal Pain    HISTORY OF PRESENT ILLNESS:  Cristina Wade is a 52 y.o. Caucasian female with medical history significant for asthma, GERD, anxiety, migraine, chronic low back pain and seasonal allergies, who presented to the emergency room with a [place] of epigastric abdominal pain followed by bloody vomitus episode.  The patient had a recent foot injury for which she was prescribed 800 mg of p.o. ibuprofen 3 times a day which she has been taking for the last week.  He has been trying to taper it off.  She explains headache as well as chest pain 3:30 AM with radiation down to her abdomen prior to her vomitus.  She admits to mild headache.  No fever or chills.  She has been having occasional bright red bleeding per rectum that she attributes to hemorrhoids.  She has mild lower abdominal pain and denies any melena.  No cough or wheezing or hemoptysis.  No dysuria, oliguria or hematuria or flank pain.  No other bleeding diathesis.  ED Course: When she came to the ER, vital signs were within normal.  Labs revealed unremarkable CMP.  High sensitive troponin I was less than 2 twice.  CBC was within normal.  UA came back with low specific gravity 1004. EKG as reviewed by me : EKG showed normal sinus rhythm with a rate of 66 with Q waves septally. Imaging: Two-view chest x-ray showed no acute cardiopulmonary disease. Noncontrasted head CT scan revealed no concerning abnormality.  It showed mild cerebral/cerebellar atrophy.  CTA of the chest abdomen and pelvis revealed the following: 1. Normal aorta and branch vessels. 2. Single prominent nonspecific  subcarinal lymph node measuring 1.4 x 3.1 cm. No other adenopathy is seen. 3. Emphysema. 4. 4.7 cm mass of either nonaerated stool or a true mucosal mass in the cecum. Further evaluation is recommended. 5. Nonobstructive bilateral micronephrolithiasis. 6. Constipation and diverticulosis. 7.  Emphysema.  The patient was given 1 L bolus of IV normal saline, 10 mg of IV Reglan and 12.5 mg of IV Benadryl as well as 40 mg of IV Protonix.  She will be admitted to a medical telemetry bed for further evaluation and management. PAST MEDICAL HISTORY:   Past Medical History:  Diagnosis Date   Anxiety    Asthma    Chronic pelvic pain in female 2018   Cystitis    Cystocele    Endometrial polyp    Endometriosis    per pt report but never seen during surg   Frequent headaches    Gastritis    GERD (gastroesophageal reflux disease)    rare   Gross hematuria    History of kidney stones    Microscopic hematuria    Migraine    Migraines    Neuropathy    Osteopenia    Urinary disorder    UTI (lower urinary tract infection)     PAST SURGICAL HISTORY:   Past Surgical History:  Procedure Laterality Date   COLONOSCOPY N/A 11/05/2014   Procedure: COLONOSCOPY;  Surgeon: Scot Jun, MD;  Location: [facility] ENDOSCOPY;  Service: Endoscopy;  Laterality: N/A;   COLONOSCOPY  11/2016   Dr. Mechele Collin   CYSTOSCOPY  2007   with biopsy    CYSTOSCOPY N/A 06/01/2016   Procedure: CYSTOSCOPY;  Surgeon: Vena Austria, MD;  Location: [facility] ORS;  Service: Gynecology;  Laterality: N/A;   DIAGNOSTIC LAPAROSCOPY     DILATATION & CURETTAGE/HYSTEROSCOPY WITH MYOSURE N/A 04/13/2015   Procedure: DILATATION & CURETTAGE/HYSTEROSCOPY WITH MYOSURE/POLYPECTOMY;  Surgeon: Elenora Fender Ward, MD;  Location: [facility] ORS;  Service: Gynecology;  Laterality: N/A;   DILATATION & CURETTAGE/HYSTEROSCOPY WITH MYOSURE N/A 06/01/2016   Procedure: DILATATION & CURETTAGE/HYSTEROSCOPY WITH MYOSURE;  Surgeon: Vena Austria, MD;  Location:  [facility] ORS;  Service: Gynecology;  Laterality: N/A;   GUM SURGERY     laproscopy  2007   POLYPECTOMY     endometrial    SOCIAL HISTORY:   Social History   Tobacco Use   Smoking status: Never   Smokeless tobacco: Never  Substance Use Topics   Alcohol use: No    FAMILY HISTORY:   Family History  Problem Relation Age of Onset   Arthritis Mother    Heart disease Mother    Stroke Mother        TIA   Hypertension Mother    Diabetes Mother    Kidney cancer Mother 39   Heart failure Mother    Hyperlipidemia Mother    Colon cancer Father 44   Arthritis Father    Hyperlipidemia Father    Transient ischemic attack Father    Cancer - Colon Father 16   Prostate cancer Father 49   Valvular heart disease Father    Cancer Maternal Grandmother    Cancer Paternal Grandfather        prostate   Breast cancer Cousin 35       has contact    DRUG ALLERGIES:   Allergies  Allergen Reactions   Milk (Cow) Other (See Comments)   Milk-Related Compounds    Omeprazole Other (See Comments)    Pt reports "Chest pain, HA and Dysuria." 01/05/22 patient states she is able to take omeprazole now without a reaction   Penicillins Itching and Rash    REVIEW OF SYSTEMS:   ROS As per history of present illness. All pertinent systems were reviewed above. Constitutional, HEENT, cardiovascular, respiratory, GI, GU, musculoskeletal, neuro, psychiatric, endocrine, integumentary and hematologic systems were reviewed and are otherwise negative/unremarkable except for positive findings mentioned above in the HPI.   MEDICATIONS AT HOME:   Prior to Admission medications   Medication Sig Start Date End Date Taking? Authorizing Provider  Azelastine HCl 137 MCG/SPRAY SOLN SMARTSIG:2 Spray(s) Both Nares Twice Daily PRN 07/21/19  Yes [provider]  Calcium Carbonate-Vit D-Min (CALCIUM 1200 PO) Take 2-3 tablets by mouth daily. Reported on 05/26/2015   Yes [provider]  cholecalciferol  (VITAMIN D3) 10 MCG (400 UNIT) TABS tablet Take 400 Units by mouth daily.   Yes [provider]  mometasone (NASONEX) 50 MCG/ACT nasal spray 2 sprays daily as needed. 06/26/20  Yes [provider]  Multiple Vitamin (MULTIVITAMIN ADULT PO) Take 1 tablet by mouth daily.   Yes [provider]  nitrofurantoin, macrocrystal-monohydrate, (MACROBID) 100 MG capsule Take 1 capsule (100 mg total) by mouth 2 (two) times daily. 08/23/22  Yes McGowan, Carollee Herter A, PA-C  nitroGLYCERIN (NITROSTAT) 0.4 MG SL tablet Place 1 tablet (0.4 mg total) under the tongue every 5 (five) minutes as needed for up to 25 days for chest pain (Maximum of 3 doses.). 10/20/21  08/31/22 Yes End, Cristal Deer, MD  polyethylene glycol (MIRALAX / GLYCOLAX) 17 g packet Take 17 g by mouth daily.   Yes [provider]  Probiotic Product (PROBIOTIC DAILY PO) Take 1 tablet by mouth daily.   Yes [provider]  psyllium (REGULOID) 0.52 g capsule Take 0.52 g by mouth daily.   Yes [provider]  traMADol (ULTRAM) 50 MG tablet Take 50 mg by mouth 3 (three) times daily as needed.   Yes [provider]  ZINC OXIDE PO Take 1 tablet by mouth daily.   Yes [provider]  escitalopram (LEXAPRO) 10 MG tablet Take 1 tablet by mouth once daily Patient not taking: Reported on 08/31/2022 03/22/22   [name], FNP  naproxen (NAPROSYN) 250 MG tablet Take 1 tablet (250 mg total) by mouth 2 (two) times daily with a meal. Patient not taking: Reported on 08/31/2022 12/09/21   End, Cristal Deer, MD  omeprazole (PRILOSEC) 20 MG capsule Take 1 capsule (20 mg total) by mouth daily. Patient not taking: Reported on 08/31/2022 12/09/21   End, Cristal Deer, MD  Povidone-Iodine 7.5 % SWAB Apply 1 applicator topically as needed. Patient not taking: Reported on 08/31/2022 05/02/21   Michiel Cowboy A, PA-C  ranolazine (RANEXA) 500 MG 12 hr tablet Take 1 tablet (500 mg total) by mouth 2 (two) times  daily. Patient not taking: Reported on 08/31/2022 02/02/22   End, Cristal Deer, MD      VITAL SIGNS:  Blood pressure 118/68, pulse (!) 55, temperature 98.1 F (36.7 C), temperature source Oral, resp. rate 18, height 5\' 10"  (1.778 m), weight 57.2 kg, last menstrual period 11/03/2021, SpO2 99 %.  PHYSICAL EXAMINATION:  Physical Exam  GENERAL:  52 y.o.-year-old Caucasian female patient lying in the bed with no acute distress.  EYES: Pupils equal, round, reactive to light and accommodation. No scleral icterus. Extraocular muscles intact.  HEENT: Head atraumatic, normocephalic. Oropharynx and nasopharynx clear.  NECK:  Supple, no jugular venous distention. No thyroid enlargement, no tenderness.  LUNGS: Normal breath sounds bilaterally, no wheezing, rales,rhonchi or crepitation. No use of accessory muscles of respiration.  CARDIOVASCULAR: Regular rate and rhythm, S1, S2 normal. No murmurs, rubs, or gallops.  ABDOMEN: Soft, nondistended, nontender. Bowel sounds present. No organomegaly or mass.  EXTREMITIES: No pedal edema, cyanosis, or clubbing.  NEUROLOGIC: Cranial nerves II through XII are intact. Muscle strength 5/5 in all extremities. Sensation intact. Gait not checked.  PSYCHIATRIC: The patient is alert and oriented x 3.  Normal affect and good eye contact. SKIN: No obvious rash, lesion, or ulcer.   LABORATORY PANEL:   CBC Recent Labs  Lab 08/30/22 2053  WBC 7.9  HGB 13.1  HCT 39.7  PLT 196   ------------------------------------------------------------------------------------------------------------------  Chemistries  Recent Labs  Lab 08/30/22 2053  NA 137  K 3.7  CL 102  CO2 26  GLUCOSE 130*  BUN 14  CREATININE 0.78  CALCIUM 9.2  AST 23  ALT 21  ALKPHOS 55  BILITOT 0.5   ------------------------------------------------------------------------------------------------------------------  Cardiac Enzymes No results for input(s): "TROPONINI" in the last 168  hours. ------------------------------------------------------------------------------------------------------------------  RADIOLOGY:  CT HEAD WO CONTRAST ( )  Result Date: 08/31/2022 CLINICAL DATA:  Headache, neurologic deficit. EXAM: CT HEAD WITHOUT CONTRAST TECHNIQUE: Contiguous axial images were obtained from the base of the skull through the vertex without intravenous contrast. RADIATION DOSE REDUCTION: This exam was performed according to the departmental dose-optimization program which includes automated exposure control, adjustment of the mA and/or kV according to patient size  and/or use of iterative reconstruction technique. COMPARISON:  Head CT 01/20/2021 FINDINGS: Brain: No evidence of acute infarction, hemorrhage, hydrocephalus, extra-axial collection or mass lesion/mass effect. There is mild cerebral cortical atrophy, without findings of significant small-vessel disease. Slight cerebellar atrophy. Basal cisterns are clear. Vascular: No hyperdense vessel or unexpected calcification. Skull: Negative for fractures or focal lesions. Sinuses/Orbits: Negative visualized orbits, visualized sinuses and mastoid air cells. Midline nasal septum. Other: None. IMPRESSION: 1. No acute intracranial CT findings. 2. Mild cerebral and slight cerebellar atrophy. Electronically Signed   By: Almira Bar M.D.   On: 08/31/2022 01:33   CT Angio Chest/Abd/Pel for Dissection W and/or Wo Contrast  Result Date: 08/31/2022 CLINICAL DATA:  Chest pain and hematemesis. Acute aortic syndrome suspected. EXAM: CT ANGIOGRAPHY CHEST, ABDOMEN AND PELVIS TECHNIQUE: Non-contrast CT of the chest was initially obtained. Multidetector CT imaging through the chest, abdomen and pelvis was performed using the standard protocol during bolus administration of intravenous contrast. Multiplanar reconstructed images and MIPs were obtained and reviewed to evaluate the vascular anatomy. RADIATION DOSE REDUCTION: This exam was performed  according to the departmental dose-optimization program which includes automated exposure control, adjustment of the mA and/or kV according to patient size and/or use of iterative reconstruction technique. CONTRAST:  OMNIPAQUE IOHEXOL 350 MG/ML SOLN COMPARISON:  PA and lateral chest today, PA and lateral chest 01/14/2022, CT abdomen and pelvis no contrast 10/04/2020, CT abdomen and pelvis with contrast 10/26/2019 FINDINGS: CTA CHEST FINDINGS Cardiovascular: The cardiac size is normal. There is no pericardial effusion. There are no visible coronary artery calcifications. The aorta and great vessels are normal. There are no visible plaques. The pulmonary arteries and veins are normal in caliber. Pulmonary arteries are centrally clear on this nondedicated study. Mediastinum/Nodes: There is a single prominent nonspecific subcarinal lymph node measuring 1.4 x 3.1 cm on 5:83. No other intrathoracic adenopathy is seen. Axillary spaces are free of adenopathy. The lower poles of the thyroid gland are unremarkable. The thoracic trachea, both of the main bronchi and the thoracic esophagus are unremarkable. Lungs/Pleura: The lungs are mildly emphysematous with centrilobular changes predominating in the upper lobes. There are symmetric bilateral apical pleuroparenchymal scar-like opacities. There are no nodules or infiltrates. There is no pleural effusion, thickening or pneumothorax. Musculoskeletal: No chest wall abnormality. No acute or significant osseous findings. Review of the MIP images confirms the above findings. CTA ABDOMEN AND PELVIS FINDINGS VASCULAR Aorta: Normal. Celiac: Normal. SMA: Normal. Renals: Both are single.  Both are normal.  No branch occlusion. IMA: Normal. Inflow and proximal outflow: Patent without evidence of aneurysm, dissection, vasculitis or significant stenosis. Veins: No obvious venous abnormality within the limitations of this arterial phase study. Review of the MIP images confirms the  above findings. 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 No other focal abnormality of the liver is seen. The gallbladder and bile ducts are unremarkable. Pancreas: No abnormality. Spleen: No abnormality.  No splenomegaly. Adrenals/Urinary Tract: No adrenal mass. Again noted a 7 mm hypodensity in the superior pole right renal cortex which is too small to characterize, but most likely represents a cyst. No follow-up imaging is recommended. For reference see JACR 2018 Feb; 264-273, Management of the Incidental Renal Mass on CT, [employer] 2021; 814-848, Bosniak Classification of Cystic Renal Masses, Version 2019. Both kidneys are otherwise unremarkable. There are scattered punctate nonobstructive caliceal stones in the inferior pole of both kidneys but no obstructing stones or  hydronephrosis. There is no bladder thickening. Stomach/Bowel: The stomach is unremarkable. The small bowel is normal caliber. There are thickened folds in the left abdominal jejunal segments consistent with nonspecific enteritis. Remainder of the small bowel is unremarkable. The appendix is normal. There is a 4.7 cm mass of either nonaerated stool or a true mucosal mass in the cecum. Further evaluation is recommended. There is mild fecal stasis. Colonic diverticulosis noted without evidence of diverticulitis. Lymphatic: No abdominal or pelvic adenopathy is seen. Reproductive: Uterus and bilateral adnexa are unremarkable. Other: Multiple pelvic phleboliths. No free air, free fluid, free hemorrhage or incarcerated lymph nodes. Musculoskeletal: No acute or significant osseous findings. Mild hip DJD. Review of the MIP images confirms the above findings. IMPRESSION: 1. Normal aorta and branch vessels. 2. Single prominent nonspecific subcarinal lymph node measuring 1.4 x 3.1 cm. No other adenopathy is seen. 3. Emphysema. 4. 4.7 cm mass of either nonaerated stool or a  true mucosal mass in the cecum. Further evaluation is recommended. 5. Nonobstructive bilateral micronephrolithiasis. 6. Constipation and diverticulosis. Emphysema (ICD10-J43.9). Electronically Signed   By: Almira Bar M.D.   On: 08/31/2022 01:28   DG Chest 2 View  Result Date: 08/30/2022 CLINICAL DATA:  Chest pain EXAM: CHEST - 2 VIEW COMPARISON:  01/14/2022 FINDINGS: Hyperinflation. Heart and mediastinal contours are within normal limits. No focal opacities or effusions. No acute bony abnormality. IMPRESSION: Hyperinflation. No active cardiopulmonary disease. Electronically Signed   By: Charlett Nose M.D.   On: 08/30/2022 21:15   US RENAL  Result Date: 08/29/2022 CLINICAL DATA:  Recurrent UTIs.  History of stones. EXAM: RENAL / URINARY TRACT ULTRASOUND COMPLETE COMPARISON:  Renal ultrasound, 11/08/2021.  CT, 10/04/2020. FINDINGS: Right Kidney: Renal measurements: 11.8 x 3.8 x 5.9 cm = volume: 136 mL. Echogenicity within normal limits. No mass or hydronephrosis visualized. Left Kidney: Renal measurements: 11.7 x 5.8 x 5.2 cm = volume: 182 mL. Echogenicity within normal limits. No mass or hydronephrosis visualized. Bladder: Appears normal for degree of bladder distention. Other: None. IMPRESSION: 1. Normal renal ultrasound. No change from prior renal ultrasound. Tiny intrarenal stones noted on the previous CT are not visualized sonographically. Electronically Signed   By: Amie Portland M.D.   On: 08/29/2022 16:42      IMPRESSION AND PLAN:  Assessment and Plan: * Upper GI bleeding - Differential diagnosis would include Mallory-Weiss tear with her vomiting, acute gastritis or duodenitis, gastric or duodenal erosions or ulcers or AVMs. - The patient will be admitted to a medical telemetry bed. - We will continue her on IV Protonix. - Her ibuprofen will be stopped. - We will follow serial H&H.   - GI consult will be obtained. - I notified Dr. Norma Fredrickson about the patient.  Abnormal abdominal CT  scan - She has a suspected cecal mass however has had a negative colonoscopy about a year ago.  Chronic GERD The patient will be placed on IV PPI as mentioned above.  Depression - We will continue her Lexapro.    DVT prophylaxis: SCDs Advanced Care Planning:  Code Status: full code. Family Communication:  The plan of care was discussed in details with the patient (and family). I answered all questions. The patient agreed to proceed with the above mentioned plan. Further management will depend upon hospital course. Disposition Plan: Back to previous home environment Consults called: GI. All the records are reviewed and case discussed with ED provider.  Status is: Inpatient   At the time of the admission, it appears  that the appropriate admission status for this patient is inpatient.  This is judged to be reasonable and necessary in order to provide the required intensity of service to ensure the patient's safety given the presenting symptoms, physical exam findings and initial radiographic and laboratory data in the context of comorbid conditions.  The patient requires inpatient status due to high intensity of service, high risk of further deterioration and high frequency of surveillance required.  I certify that at the time of admission, it is my clinical judgment that the patient will require inpatient hospital care extending more than 2 midnights.                            Dispo: The patient is from: Home              Anticipated d/c is to: Home              Patient currently is not medically stable to d/c.              Difficult to place patient: No  Hannah Beat M.D on 08/31/2022 at 5:35 AM  [facility]   From 7 PM-7 AM, contact night-coverage [email or web address]  CC: Primary care physician; [name], FNP

## 2022-08-31 NOTE — Anesthesia Procedure Notes (Signed)
Procedure Name: MAC Date/Time: 08/31/2022 12:47 PM  Performed by: Hezzie Bump, CRNAPre-anesthesia Checklist: Patient identified, Emergency Drugs available, Suction available and Patient being monitored Patient Re-evaluated:Patient Re-evaluated prior to induction Oxygen Delivery Method: Nasal cannula Induction Type: IV induction Placement Confirmation: positive ETCO2

## 2022-08-31 NOTE — Interval H&P Note (Signed)
History and Physical Interval Note: Preprocedure H&P from 08/31/22  was reviewed and there was no interval change after seeing and examining the patient.  Written consent was obtained from the patient after discussion of risks, benefits, and alternatives. Patient has consented to proceed with Esophagogastroduodenoscopy with possible intervention   08/31/2022 12:49 PM  Cristina Wade  has presented today for surgery, with the diagnosis of hematemesis.  The various methods of treatment have been discussed with the patient and family. After consideration of risks, benefits and other options for treatment, the patient has consented to  Procedure(s): ESOPHAGOGASTRODUODENOSCOPY (EGD) WITH PROPOFOL (N/A) as a surgical intervention.  The patient's history has been reviewed, patient examined, no change in status, stable for surgery.  I have reviewed the patient's chart and labs.  Questions were answered to the patient's satisfaction.     Jaynie Collins

## 2022-08-31 NOTE — Progress Notes (Signed)
Discharge instructions reviewed with patient. Questions were answered and encourage. IV was taken out.

## 2022-08-31 NOTE — Anesthesia Preprocedure Evaluation (Signed)
Anesthesia Evaluation  Patient identified by MRN, date of birth, ID band Patient awake    Reviewed: Allergy & Precautions, NPO status , Patient's Chart, lab work & pertinent test results  History of Anesthesia Complications Negative for: history of anesthetic complications  Airway Mallampati: III  TM Distance: <3 FB Neck ROM: full    Dental  (+) Chipped   Pulmonary neg shortness of breath, asthma    Pulmonary exam normal        Cardiovascular Exercise Tolerance: Good + Orthopnea  (-) Past MI Normal cardiovascular exam     Neuro/Psych  Headaches  negative psych ROS   GI/Hepatic Neg liver ROS,GERD  Controlled,,  Endo/Other   Hyperthyroidism   Renal/GU negative Renal ROS  negative genitourinary   Musculoskeletal   Abdominal   Peds  Hematology negative hematology ROS (+)   Anesthesia Other Findings Patient is NPO appropriate and reports no nausea or vomiting today.  Past Medical History: No date: Anxiety No date: Asthma 2018: Chronic pelvic pain in female No date: Cystitis No date: Cystocele No date: Endometrial polyp No date: Endometriosis     Comment:  per pt report but never seen during surg No date: Frequent headaches No date: Gastritis No date: GERD (gastroesophageal reflux disease)     Comment:  rare No date: Gross hematuria No date: History of kidney stones No date: Microscopic hematuria No date: Migraine No date: Migraines No date: Neuropathy No date: Osteopenia No date: Urinary disorder No date: UTI (lower urinary tract infection)  Past Surgical History: 11/05/2014: COLONOSCOPY; N/A     Comment:  Procedure: COLONOSCOPY;  Surgeon: Scot Jun, MD;               Location: [facility] ENDOSCOPY;  Service: Endoscopy;                Laterality: N/A; 11/2016: COLONOSCOPY     Comment:  Dr. Mechele Collin 2007: CYSTOSCOPY     Comment:  with biopsy  06/01/2016: CYSTOSCOPY; N/A     Comment:  Procedure:  CYSTOSCOPY;  Surgeon: Vena Austria, MD;                Location: [facility] ORS;  Service: Gynecology;  Laterality:               N/A; No date: DIAGNOSTIC LAPAROSCOPY 04/13/2015: DILATATION & CURETTAGE/HYSTEROSCOPY WITH MYOSURE; N/A     Comment:  Procedure: DILATATION & CURETTAGE/HYSTEROSCOPY WITH               MYOSURE/POLYPECTOMY;  Surgeon: Elenora Fender Ward, MD;                Location: [facility] ORS;  Service: Gynecology;  Laterality:               N/A; 06/01/2016: DILATATION & CURETTAGE/HYSTEROSCOPY WITH MYOSURE; N/A     Comment:  Procedure: DILATATION & CURETTAGE/HYSTEROSCOPY WITH               MYOSURE;  Surgeon: Vena Austria, MD;  Location: [facility]               ORS;  Service: Gynecology;  Laterality: N/A; No date: DILATION AND CURETTAGE OF UTERUS No date: GUM SURGERY 2007: laproscopy No date: POLYPECTOMY     Comment:  endometrial  BMI    Body Mass Index: 18.08 kg/m      Reproductive/Obstetrics negative OB ROS  Anesthesia Physical Anesthesia Plan  ASA: 3  Anesthesia Plan: General   Post-op Pain Management:    Induction: Intravenous  PONV Risk Score and Plan: Propofol infusion and TIVA  Airway Management Planned: Natural Airway and Nasal Cannula  Additional Equipment:   Intra-op Plan:   Post-operative Plan:   Informed Consent: I have reviewed the patients History and Physical, chart, labs and discussed the procedure including the risks, benefits and alternatives for the proposed anesthesia with the patient or authorized representative who has indicated his/her understanding and acceptance.     Dental Advisory Given  Plan Discussed with: Anesthesiologist, CRNA and Surgeon  Anesthesia Plan Comments: (Patient consented for risks of anesthesia including but not limited to:  - adverse reactions to medications - risk of airway placement if required - damage to eyes, teeth, lips or other oral mucosa - nerve damage due  to positioning  - sore throat or hoarseness - Damage to heart, brain, nerves, lungs, other parts of body or loss of life  Patient voiced understanding.)       Anesthesia Quick Evaluation

## 2022-08-31 NOTE — Care Plan (Signed)
Brief GI postop note See full op report for further details Gastritis and mild duodenitis characterized by erosions in the stomach and some hematin noted.  This is likely secondary to NSAID use Biopsies taken for H. Pylori Continue twice daily PPI by mouth for 8 weeks Advance diet as tolerated Avoid NSAIDs  CT likely representing fecal ball as the patient had a colonoscopy without cecal lesion in June 2023.  GI to sign off. Available as needed. Please do not hesitate to call regarding questions or concerns.  Enis Slipper, DO [facility]

## 2022-08-31 NOTE — TOC CM/SW Note (Signed)
  Transition of Care [facility]) Screening Note   Patient Details  Name: Cristina Wade Date of Birth: 06/18/1970   Transition of Care [facility]) CM/SW Contact:    Margarito Liner, LCSW Phone Number: 08/31/2022, 4:26 PM    Transition of Care Department [facility]) has reviewed patient and no TOC needs have been identified at this time. We will continue to monitor patient advancement through interdisciplinary progression rounds. If new patient transition needs arise, please place a TOC consult.

## 2022-08-31 NOTE — Discharge Summary (Signed)
Physician Discharge Summary   Patient: Cristina Wade MRN: [id number] DOB: September 20, 1970  Admit date:     08/30/2022  Discharge date: 08/31/22  Discharge Physician: Alberteen Sam   PCP: [name], FNP     Recommendations at discharge:  Follow up with PCP Rennie Plowman for GI bleed from NSAIDs Follow up with [facility] for GI bleed     Discharge Diagnoses: Principal Problem:   Upper GI bleeding due to NSAIDs Active Problems:   Chronic GERD   Depression     Hospital Course: Cristina Wade is a 52 y.o. F with no significant past medical history who presented with hematemesis.  Evidently patient broke her ankle recently, has been taking 800 ibuprofen 3 times daily for the last week, started to develop some epigastric pain, then on the night of admission throughout malaise like coffee grounds and came to the ER.     * Upper GI bleeding Patient was admitted on IV PPI.  Serial hemoglobins were stable.  GI were consulted, and they obtained EGD, that showed NSAID induced gastritis, no other abnormal findings.  They recommended advancing diet, treatment with twice daily p.o. PPI, and follow-up in the office.  She was able to tolerate oral diet well, was asymptomatic, and discharged with follow-up.    Abnormal abdominal CT scan CT showed an incidental finding of possible cecal mass, versus stool ball.  GI reviewed this, and recommended no follow-up given her normal colonoscopy 1 year ago.             [facility] Controlled Substances Registry was reviewed for this patient prior to discharge.  Consultants: Gastroenterology, Dr. Timothy Lasso  Procedures performed:  -CT abdomen and pelvis - EGD  Disposition: Home Diet recommendation:  Discharge Diet Orders (From admission, onward)     Start     Ordered   08/31/22 0000  Diet - low sodium heart healthy        08/31/22 1507             DISCHARGE MEDICATION: Allergies as of 08/31/2022        Reactions   Milk (cow) Other (See Comments)   Milk-related Compounds    Omeprazole Other (See Comments)   Pt reports "Chest pain, HA and Dysuria." 01/05/22 patient states she is able to take omeprazole now without a reaction   Penicillins Itching, Rash        Medication List     STOP taking these medications    escitalopram 10 MG tablet Commonly known as: LEXAPRO   naproxen 250 MG tablet Commonly known as: NAPROSYN   omeprazole 20 MG capsule Commonly known as: PRILOSEC   Povidone-Iodine 7.5 % Swab   ranolazine 500 MG 12 hr tablet Commonly known as: Ranexa       TAKE these medications    Azelastine HCl 137 MCG/SPRAY Soln SMARTSIG:2 Spray(s) Both Nares Twice Daily PRN   CALCIUM 1200 PO Take 2-3 tablets by mouth daily. Reported on 05/26/2015   cholecalciferol 10 MCG (400 UNIT) Tabs tablet Commonly known as: VITAMIN D3 Take 400 Units by mouth daily.   mometasone 50 MCG/ACT nasal spray Commonly known as: NASONEX 2 sprays daily as needed.   MULTIVITAMIN ADULT PO Take 1 tablet by mouth daily.   nitrofurantoin (macrocrystal-monohydrate) 100 MG capsule Commonly known as: MACROBID Take 1 capsule (100 mg total) by mouth 2 (two) times daily.   nitroGLYCERIN 0.4 MG SL tablet Commonly known as: NITROSTAT Place 1 tablet (0.4 mg total) under  the tongue every 5 (five) minutes as needed for up to 25 days for chest pain (Maximum of 3 doses.).   pantoprazole 40 MG tablet Commonly known as: PROTONIX Take 1 tablet (40 mg total) by mouth 2 (two) times daily before a meal.   polyethylene glycol 17 g packet Commonly known as: MIRALAX / GLYCOLAX Take 17 g by mouth daily.   PROBIOTIC DAILY PO Take 1 tablet by mouth daily.   psyllium 0.52 g capsule Commonly known as: REGULOID Take 0.52 g by mouth daily.   traMADol 50 MG tablet Commonly known as: ULTRAM Take 1 tablet (50 mg total) by mouth 3 (three) times daily as needed.   ZINC OXIDE PO Take 1 tablet by mouth daily.         Follow-up Information     Arnett, Lyn Records, FNP. Schedule an appointment as soon as possible for a visit in 1 week(s).   Specialty: Family Medicine Contact information: [address] [place] [address] [place] [address] [phone]         Regis Bill, MD. Schedule an appointment as soon as possible for a visit in 1 month(s).   Specialty: Gastroenterology Contact information: [address] [place] [address] [phone]                 Discharge Instructions     Diet - low sodium heart healthy   Complete by: As directed    Discharge instructions   Complete by: As directed    **IMPORTANT DISCHARGE INSTRUCTIONS**   From Dr. Maryfrances Bunnell: You were admitted for vomiting blood   Here, you had an endoscopy (EGD) that looked into your stomach and showed that you had irritation in the stomach from ibuprofen and Aleve  Ibuprofen and Aleve (and Advil, Motrin, naproxen and other products like Goody's powders and BC powders) are all NSAIDs, which damage the lining of your stomach  Avoid NSAIDs in the future, except in small amounts.  To protect the stomach and allow it to heal, take pantoprazole/Protonix 40 mg twice daily before breakfast and dinner (taking a 1/2 hour before eating is ideal)  Pantoprazole is a medicine similar to omeprazole/Prilosec just prescription strength and it suppresses stomach acid so the stomach can heal  Instead of NSAIDs, you can take tramadol 50 mg up to three times daily for pain  Go see your GI doctor Dr. Mia Creek in 4-8 weeks   Increase activity slowly   Complete by: As directed        Discharge Exam: Filed Weights   08/30/22 2050  Weight: 57.2 kg    General: Pt is alert, awake, not in acute distress Cardiovascular: RRR, nl S1-S2, no murmurs appreciated.   No LE edema.   Respiratory: Normal respiratory rate and rhythm.  CTAB without rales or wheezes. Abdominal: Abdomen soft and non-tender.  No distension  or HSM.   Neuro/Psych: Strength symmetric in upper and lower extremities.  Judgment and insight appear normal.   Condition at discharge: good  The results of significant diagnostics from this hospitalization (including imaging, microbiology, ancillary and laboratory) are listed below for reference.   Imaging Studies: CT HEAD WO CONTRAST ( )  Result Date: 08/31/2022 CLINICAL DATA:  Headache, neurologic deficit. EXAM: CT HEAD WITHOUT CONTRAST TECHNIQUE: Contiguous axial images were obtained from the base of the skull through the vertex without intravenous contrast. RADIATION DOSE REDUCTION: This exam was performed according to the departmental dose-optimization program which includes automated exposure control, adjustment of the mA and/or  kV according to patient size and/or use of iterative reconstruction technique. COMPARISON:  Head CT 01/20/2021 FINDINGS: Brain: No evidence of acute infarction, hemorrhage, hydrocephalus, extra-axial collection or mass lesion/mass effect. There is mild cerebral cortical atrophy, without findings of significant small-vessel disease. Slight cerebellar atrophy. Basal cisterns are clear. Vascular: No hyperdense vessel or unexpected calcification. Skull: Negative for fractures or focal lesions. Sinuses/Orbits: Negative visualized orbits, visualized sinuses and mastoid air cells. Midline nasal septum. Other: None. IMPRESSION: 1. No acute intracranial CT findings. 2. Mild cerebral and slight cerebellar atrophy. Electronically Signed   By: Almira Bar M.D.   On: 08/31/2022 01:33   CT Angio Chest/Abd/Pel for Dissection W and/or Wo Contrast  Result Date: 08/31/2022 CLINICAL DATA:  Chest pain and hematemesis. Acute aortic syndrome suspected. EXAM: CT ANGIOGRAPHY CHEST, ABDOMEN AND PELVIS TECHNIQUE: Non-contrast CT of the chest was initially obtained. Multidetector CT imaging through the chest, abdomen and pelvis was performed using the standard protocol during bolus  administration of intravenous contrast. Multiplanar reconstructed images and MIPs were obtained and reviewed to evaluate the vascular anatomy. RADIATION DOSE REDUCTION: This exam was performed according to the departmental dose-optimization program which includes automated exposure control, adjustment of the mA and/or kV according to patient size and/or use of iterative reconstruction technique. CONTRAST:  OMNIPAQUE IOHEXOL 350 MG/ML SOLN COMPARISON:  PA and lateral chest today, PA and lateral chest 01/14/2022, CT abdomen and pelvis no contrast 10/04/2020, CT abdomen and pelvis with contrast 10/26/2019 FINDINGS: CTA CHEST FINDINGS Cardiovascular: The cardiac size is normal. There is no pericardial effusion. There are no visible coronary artery calcifications. The aorta and great vessels are normal. There are no visible plaques. The pulmonary arteries and veins are normal in caliber. Pulmonary arteries are centrally clear on this nondedicated study. Mediastinum/Nodes: There is a single prominent nonspecific subcarinal lymph node measuring 1.4 x 3.1 cm on 5:83. No other intrathoracic adenopathy is seen. Axillary spaces are free of adenopathy. The lower poles of the thyroid gland are unremarkable. The thoracic trachea, both of the main bronchi and the thoracic esophagus are unremarkable. Lungs/Pleura: The lungs are mildly emphysematous with centrilobular changes predominating in the upper lobes. There are symmetric bilateral apical pleuroparenchymal scar-like opacities. There are no nodules or infiltrates. There is no pleural effusion, thickening or pneumothorax. Musculoskeletal: No chest wall abnormality. No acute or significant osseous findings. Review of the MIP images confirms the above findings. CTA ABDOMEN AND PELVIS FINDINGS VASCULAR Aorta: Normal. Celiac: Normal. SMA: Normal. Renals: Both are single.  Both are normal.  No branch occlusion. IMA: Normal. Inflow and proximal outflow: Patent without evidence  of aneurysm, dissection, vasculitis or significant stenosis. Veins: No obvious venous abnormality within the limitations of this arterial phase study. Review of the MIP images confirms the above findings. 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 No other focal abnormality of the liver is seen. The gallbladder and bile ducts are unremarkable. Pancreas: No abnormality. Spleen: No abnormality.  No splenomegaly. Adrenals/Urinary Tract: No adrenal mass. Again noted a 7 mm hypodensity in the superior pole right renal cortex which is too small to characterize, but most likely represents a cyst. No follow-up imaging is recommended. For reference see JACR 2018 Feb; 264-273, Management of the Incidental Renal Mass on CT, [employer] 2021; 814-848, Bosniak Classification of Cystic Renal Masses, Version 2019. Both kidneys are otherwise unremarkable. There are scattered punctate nonobstructive caliceal stones in the inferior pole of both kidneys  but no obstructing stones or hydronephrosis. There is no bladder thickening. Stomach/Bowel: The stomach is unremarkable. The small bowel is normal caliber. There are thickened folds in the left abdominal jejunal segments consistent with nonspecific enteritis. Remainder of the small bowel is unremarkable. The appendix is normal. There is a 4.7 cm mass of either nonaerated stool or a true mucosal mass in the cecum. Further evaluation is recommended. There is mild fecal stasis. Colonic diverticulosis noted without evidence of diverticulitis. Lymphatic: No abdominal or pelvic adenopathy is seen. Reproductive: Uterus and bilateral adnexa are unremarkable. Other: Multiple pelvic phleboliths. No free air, free fluid, free hemorrhage or incarcerated lymph nodes. Musculoskeletal: No acute or significant osseous findings. Mild hip DJD. Review of the MIP images confirms the above findings. IMPRESSION: 1. Normal aorta  and branch vessels. 2. Single prominent nonspecific subcarinal lymph node measuring 1.4 x 3.1 cm. No other adenopathy is seen. 3. Emphysema. 4. 4.7 cm mass of either nonaerated stool or a true mucosal mass in the cecum. Further evaluation is recommended. 5. Nonobstructive bilateral micronephrolithiasis. 6. Constipation and diverticulosis. Emphysema (ICD10-J43.9). Electronically Signed   By: Almira Bar M.D.   On: 08/31/2022 01:28   DG Chest 2 View  Result Date: 08/30/2022 CLINICAL DATA:  Chest pain EXAM: CHEST - 2 VIEW COMPARISON:  01/14/2022 FINDINGS: Hyperinflation. Heart and mediastinal contours are within normal limits. No focal opacities or effusions. No acute bony abnormality. IMPRESSION: Hyperinflation. No active cardiopulmonary disease. Electronically Signed   By: Charlett Nose M.D.   On: 08/30/2022 21:15   US RENAL  Result Date: 08/29/2022 CLINICAL DATA:  Recurrent UTIs.  History of stones. EXAM: RENAL / URINARY TRACT ULTRASOUND COMPLETE COMPARISON:  Renal ultrasound, 11/08/2021.  CT, 10/04/2020. FINDINGS: Right Kidney: Renal measurements: 11.8 x 3.8 x 5.9 cm = volume: 136 mL. Echogenicity within normal limits. No mass or hydronephrosis visualized. Left Kidney: Renal measurements: 11.7 x 5.8 x 5.2 cm = volume: 182 mL. Echogenicity within normal limits. No mass or hydronephrosis visualized. Bladder: Appears normal for degree of bladder distention. Other: None. IMPRESSION: 1. Normal renal ultrasound. No change from prior renal ultrasound. Tiny intrarenal stones noted on the previous CT are not visualized sonographically. Electronically Signed   By: Amie Portland M.D.   On: 08/29/2022 16:42    Microbiology: Results for orders placed or performed in visit on 08/22/22  CULTURE, URINE COMPREHENSIVE     Status: Abnormal   Collection Time: 08/22/22  3:45 PM   Specimen: Urine   UR  Result Value Ref Range Status   Urine Culture, Comprehensive Final report (A)  Final   Organism ID, Bacteria  Escherichia coli (A)  Final    Comment: Cefazolin <=4 ug/mL Cefazolin with an MIC <=16 predicts susceptibility to the oral agents cefaclor, cefdinir, cefpodoxime, cefprozil, cefuroxime, cephalexin, and loracarbef when used for therapy of uncomplicated urinary tract infections due to E. coli, Klebsiella pneumoniae, and Proteus mirabilis. Greater than 100,000 colony forming units per mL    Organism ID, Bacteria Not applicable  Final   ANTIMICROBIAL SUSCEPTIBILITY Comment  Final    Comment:       ** S = Susceptible; I = Intermediate; R = Resistant **                    P = Positive; N = Negative             MICS are expressed in micrograms per mL    Antibiotic  RSLT#1    RSLT#2    RSLT#3    RSLT#4 Amoxicillin/Clavulanic Acid    S Ampicillin                     R Cefepime                       S Ceftriaxone                    S Cefuroxime                     S Ciprofloxacin                  S Ertapenem                      S Gentamicin                     S Imipenem                       S Levofloxacin                   S Meropenem                      S Nitrofurantoin                 S Piperacillin/Tazobactam        S Tetracycline                   S Tobramycin                     S Trimethoprim/Sulfa             S   Microscopic Examination     Status: Abnormal   Collection Time: 08/22/22  3:45 PM   Urine  Result Value Ref Range Status   WBC, UA >30 (A) 0 - 5 /hpf Final   RBC, Urine 0-2 0 - 2 /hpf Final   Epithelial Cells (non renal) 0-10 0 - 10 /hpf Final   Bacteria, UA Many (A) None seen/Few Final    Labs: CBC: Recent Labs  Lab 08/30/22 2053 08/31/22 0630  WBC 7.9 4.9  NEUTROABS 6.7  --   HGB 13.1 12.1  HCT 39.7 37.1  MCV 92.3 91.6  PLT 196 180   Basic Metabolic Panel: Recent Labs  Lab 08/30/22 2053 08/31/22 0630  NA 137 139  K 3.7 3.2*  CL 102 107  CO2 26 25  GLUCOSE 130* 96  BUN 14 14  CREATININE 0.78 0.64  CALCIUM 9.2 8.8*   Liver  Function Tests: Recent Labs  Lab 08/30/22 2053  AST 23  ALT 21  ALKPHOS 55  BILITOT 0.5  PROT 6.7  ALBUMIN 4.2   CBG: No results for input(s): "GLUCAP" in the last 168 hours.  Discharge time spent: approximately 35 minutes spent on discharge counseling, evaluation of patient on day of discharge, and coordination of discharge planning with nursing, social work, pharmacy and case management  Signed: Alberteen Sam, MD [facility] 08/31/2022

## 2022-08-31 NOTE — Assessment & Plan Note (Signed)
-   We will continue her Lexapro. 

## 2022-08-31 NOTE — Progress Notes (Signed)
Patient received from Endo. Alert and oriented. Denies pain. Requesting food.   Cristina Wade

## 2022-08-31 NOTE — Transfer of Care (Signed)
Immediate Anesthesia Transfer of Care Note  Patient: Cristina Wade  Procedure(s) Performed: ESOPHAGOGASTRODUODENOSCOPY (EGD) WITH PROPOFOL  Patient Location: Endoscopy Unit  Anesthesia Type:General  Level of Consciousness: awake, alert , and oriented  Airway & Oxygen Therapy: Patient Spontanous Breathing  Post-op Assessment: Report given to RN and Post -op Vital signs reviewed and stable  Post vital signs: Reviewed and stable  Last Vitals:  Vitals Value Taken Time  BP 112/63 08/31/22 1302  Temp 36.3 C 08/31/22 1302  Pulse 57 08/31/22 1302  Resp 25 08/31/22 1302  SpO2 100 % 08/31/22 1302  Vitals shown include unvalidated device data.  Last Pain:  Vitals:   08/31/22 1302  TempSrc: Temporal  PainSc: 0-No pain         Complications: No notable events documented.

## 2022-08-31 NOTE — Assessment & Plan Note (Signed)
-   She has a suspected cecal mass however has had a negative colonoscopy about a year ago.

## 2022-09-01 ENCOUNTER — Telehealth: Payer: Self-pay

## 2022-09-01 ENCOUNTER — Other Ambulatory Visit: Payer: Self-pay

## 2022-09-01 ENCOUNTER — Encounter: Payer: Self-pay | Admitting: Gastroenterology

## 2022-09-01 LAB — HIV ANTIBODY (ROUTINE TESTING W REFLEX): HIV Screen 4th Generation wRfx: NONREACTIVE

## 2022-09-01 LAB — SURGICAL PATHOLOGY

## 2022-09-01 NOTE — Transitions of Care (Post Inpatient/ED Visit) (Signed)
09/01/2022  Name: Cristina Wade MRN: [id number] DOB: 11-Jul-1970  Today's TOC FU Call Status: Today's TOC FU Call Status:: Successful TOC FU Call Competed TOC FU Call Complete Date: 09/01/22  Transition Care Management Follow-up Telephone Call Date of Discharge: 08/31/22 Discharge Facility: [facility]) Type of Discharge: Inpatient Admission Primary Inpatient Discharge Diagnosis:: GI hemorrhage How have you been since you were released from the hospital?: Better Any questions or concerns?: No  Items Reviewed: Did you receive and understand the discharge instructions provided?: Yes Medications obtained,verified, and reconciled?: Yes (Medications Reviewed) Any new allergies since your discharge?: No Dietary orders reviewed?: Yes Do you have support at home?: No  Medications Reviewed Today: Medications Reviewed Today     Reviewed by Karena Addison, LPN (Licensed Practical Nurse) on 09/01/22 at [id number]  Med List Status: <None>   Medication Order Taking? Sig Documenting Provider Last Dose Status Informant  Azelastine HCl 137 MCG/SPRAY SOLN [id number] Yes SMARTSIG:2 Spray(s) Both Nares Twice Daily PRN [provider] Taking Active Self  Calcium Carbonate-Vit D-Min (CALCIUM 1200 PO) [id number] Yes Take 2-3 tablets by mouth daily. Reported on 05/26/2015 [provider] Taking Active Self  cholecalciferol (VITAMIN D3) 10 MCG (400 UNIT) TABS tablet [id number] Yes Take 400 Units by mouth daily. [provider] Taking Active Self  lidocaine (XYLOCAINE) 2 % jelly 1 application [id number]   Sondra Come, MD  Active   mometasone (NASONEX) 50 MCG/ACT nasal spray [id number] Yes 2 sprays daily as needed. [provider] Taking Active Self  Multiple Vitamin (MULTIVITAMIN ADULT PO) [id number] Yes Take 1 tablet by mouth daily. [provider] Taking Active Self  nitrofurantoin, macrocrystal-monohydrate, (MACROBID) 100 MG capsule  [id number] Yes Take 1 capsule (100 mg total) by mouth 2 (two) times daily. Harle Battiest, PA-C Taking Active Self  nitroGLYCERIN (NITROSTAT) 0.4 MG SL tablet [id number]  Place 1 tablet (0.4 mg total) under the tongue every 5 (five) minutes as needed for up to 25 days for chest pain (Maximum of 3 doses.). Yvonne Kendall, MD  Expired 08/31/22 2359 Self  pantoprazole (PROTONIX) 40 MG tablet [id number] Yes Take 1 tablet (40 mg total) by mouth 2 (two) times daily before a meal. Danford, Earl Lites, MD Taking Active   polyethylene glycol (MIRALAX / GLYCOLAX) 17 g packet [id number] Yes Take 17 g by mouth daily. [provider] Taking Active Self  Probiotic Product (PROBIOTIC DAILY PO) [id number] Yes Take 1 tablet by mouth daily. [provider] Taking Active Self  psyllium (REGULOID) 0.52 g capsule [id number] Yes Take 0.52 g by mouth daily. [provider] Taking Active Self  traMADol (ULTRAM) 50 MG tablet [id number] Yes Take 1 tablet (50 mg total) by mouth 3 (three) times daily as needed. Alberteen Sam, MD Taking Active   ZINC OXIDE PO [id number] Yes Take 1 tablet by mouth daily. [provider] Taking Active Self            Home Care and Equipment/Supplies: Were Home Health Services Ordered?: NA Any new equipment or medical supplies ordered?: NA  Functional Questionnaire: Do you need assistance with bathing/showering or dressing?: No Do you need assistance with meal preparation?: No Do you need assistance with eating?: No Do you have difficulty maintaining continence: No Do you need assistance with getting out of bed/getting out of a chair/moving?: No Do you have difficulty managing or taking your medications?: No  Follow up appointments reviewed: PCP Follow-up appointment confirmed?: Yes Date of PCP follow-up appointment?: 09/06/22 Follow-up  Provider: [facility] Follow-up appointment confirmed?: No Reason Specialist Follow-Up  Not Confirmed: Patient has Specialist Provider Number and will Call for Appointment Do you need transportation to your follow-up appointment?: No Do you understand care options if your condition(s) worsen?: Yes-patient verbalized understanding    SIGNATURE Karena Addison, LPN [facility] Nurse Health Advisor Direct Dial [phone]

## 2022-09-04 NOTE — Telephone Encounter (Signed)
Appointment made

## 2022-09-05 ENCOUNTER — Encounter: Payer: Self-pay | Admitting: Physician Assistant

## 2022-09-05 ENCOUNTER — Ambulatory Visit: Payer: BC Managed Care – PPO | Admitting: Physician Assistant

## 2022-09-05 VITALS — BP 106/71 | HR 72 | Ht 70.0 in | Wt 126.0 lb

## 2022-09-05 DIAGNOSIS — R82998 Other abnormal findings in urine: Secondary | ICD-10-CM | POA: Diagnosis not present

## 2022-09-05 DIAGNOSIS — N390 Urinary tract infection, site not specified: Secondary | ICD-10-CM

## 2022-09-05 DIAGNOSIS — R339 Retention of urine, unspecified: Secondary | ICD-10-CM | POA: Diagnosis not present

## 2022-09-05 DIAGNOSIS — R300 Dysuria: Secondary | ICD-10-CM

## 2022-09-05 LAB — URINALYSIS, COMPLETE
Bilirubin, UA: NEGATIVE
Glucose, UA: NEGATIVE
Ketones, UA: NEGATIVE
Leukocytes,UA: NEGATIVE
Nitrite, UA: POSITIVE — AB
Protein,UA: NEGATIVE
RBC, UA: NEGATIVE
Specific Gravity, UA: 1.01 (ref 1.005–1.030)
Urobilinogen, Ur: 0.2 mg/dL (ref 0.2–1.0)
pH, UA: 7 (ref 5.0–7.5)

## 2022-09-05 LAB — MICROSCOPIC EXAMINATION

## 2022-09-05 LAB — BLADDER SCAN AMB NON-IMAGING

## 2022-09-06 ENCOUNTER — Other Ambulatory Visit
Admission: RE | Admit: 2022-09-06 | Discharge: 2022-09-06 | Disposition: A | Discharge status: Home / Self Care | Payer: BC Managed Care – PPO | Attending: "Endocrinology | Admitting: "Endocrinology

## 2022-09-06 ENCOUNTER — Ambulatory Visit: Payer: BC Managed Care – PPO | Admitting: Family

## 2022-09-06 ENCOUNTER — Encounter: Payer: Self-pay | Admitting: Family

## 2022-09-06 VITALS — BP 110/70 | HR 74 | Temp 97.8°F | Ht 70.0 in | Wt 126.2 lb

## 2022-09-06 DIAGNOSIS — N2889 Other specified disorders of kidney and ureter: Secondary | ICD-10-CM

## 2022-09-06 DIAGNOSIS — E061 Subacute thyroiditis: Secondary | ICD-10-CM | POA: Insufficient documentation

## 2022-09-06 DIAGNOSIS — R9389 Abnormal findings on diagnostic imaging of other specified body structures: Secondary | ICD-10-CM

## 2022-09-06 DIAGNOSIS — K922 Gastrointestinal hemorrhage, unspecified: Secondary | ICD-10-CM

## 2022-09-06 DIAGNOSIS — R519 Headache, unspecified: Secondary | ICD-10-CM | POA: Diagnosis not present

## 2022-09-06 LAB — T4, FREE: Free T4: 0.86 ng/dL (ref 0.61–1.12)

## 2022-09-06 LAB — BASIC METABOLIC PANEL
BUN: 16 mg/dL (ref 6–23)
CO2: 30 mEq/L (ref 19–32)
Calcium: 9.6 mg/dL (ref 8.4–10.5)
Chloride: 103 mEq/L (ref 96–112)
Creatinine, Ser: 0.72 mg/dL (ref 0.40–1.20)
GFR: 95.07 mL/min (ref >60.00)
Glucose, Bld: 91 mg/dL (ref 70–99)
Potassium: 4.7 mEq/L (ref 3.5–5.1)
Sodium: 140 mEq/L (ref 135–145)

## 2022-09-06 LAB — TSH: TSH: 1.449 u[IU]/mL (ref 0.350–4.500)

## 2022-09-06 MED ORDER — SULFAMETHOXAZOLE-TRIMETHOPRIM 800-160 MG PO TABS: 1.0000 | ORAL_TABLET | Freq: Two times a day (BID) | ORAL | 0 refills | Status: AC

## 2022-09-06 NOTE — Progress Notes (Signed)
09/05/2022 1:06 PM   Cristina Wade [birth year] [id number]  CC: Chief Complaint  Patient presents with   Follow-up   Dysuria   Urinary Retention   HPI: Cristina Wade is a 52 y.o. female with PMH high risk hematuria with benign workup in 2022/2023, neurogenic bladder managed with CIC 3 times daily, nephrolithiasis, and recurrent UTI who presents today for evaluation of possible UTI.   In-office catheterized UA notable for nitrites; urine microscopy with many bacteria. Measured residual .  PMH: Past Medical History:  Diagnosis Date   Anxiety    Asthma    Chronic pelvic pain in female 2018   Cystitis    Cystocele    Endometrial polyp    Endometriosis    per pt report but never seen during surg   Frequent headaches    Gastritis    GERD (gastroesophageal reflux disease)    rare   Gross hematuria    History of kidney stones    Microscopic hematuria    Migraine    Migraines    Neuropathy    Osteopenia    Urinary disorder    UTI (lower urinary tract infection)     Surgical History: Past Surgical History:  Procedure Laterality Date   COLONOSCOPY N/A 11/05/2014   Procedure: COLONOSCOPY;  Surgeon: Scot Jun, MD;  Location: [facility] ENDOSCOPY;  Service: Endoscopy;  Laterality: N/A;   COLONOSCOPY  11/2016   Dr. Mechele Collin   CYSTOSCOPY  2007   with biopsy    CYSTOSCOPY N/A 06/01/2016   Procedure: CYSTOSCOPY;  Surgeon: Vena Austria, MD;  Location: [facility] ORS;  Service: Gynecology;  Laterality: N/A;   DIAGNOSTIC LAPAROSCOPY     DILATATION & CURETTAGE/HYSTEROSCOPY WITH MYOSURE N/A 04/13/2015   Procedure: DILATATION & CURETTAGE/HYSTEROSCOPY WITH MYOSURE/POLYPECTOMY;  Surgeon: Elenora Fender Ward, MD;  Location: [facility] ORS;  Service: Gynecology;  Laterality: N/A;   DILATATION & CURETTAGE/HYSTEROSCOPY WITH MYOSURE N/A 06/01/2016   Procedure: DILATATION & CURETTAGE/HYSTEROSCOPY WITH MYOSURE;  Surgeon: Vena Austria, MD;  Location: [facility] ORS;  Service: Gynecology;  Laterality:  N/A;   DILATION AND CURETTAGE OF UTERUS     ESOPHAGOGASTRODUODENOSCOPY (EGD) WITH PROPOFOL N/A 08/31/2022   Procedure: ESOPHAGOGASTRODUODENOSCOPY (EGD) WITH PROPOFOL;  Surgeon: Jaynie Collins, DO;  Location: [facility] ENDOSCOPY;  Service: Gastroenterology;  Laterality: N/A;   GUM SURGERY     laproscopy  2007   POLYPECTOMY     endometrial    Home Medications:  Allergies as of 09/05/2022       Reactions   Milk (cow) Other (See Comments)   Milk-related Compounds    Omeprazole Other (See Comments)   Pt reports "Chest pain, HA and Dysuria." 01/05/22 patient states she is able to take omeprazole now without a reaction   Penicillins Itching, Rash        Medication List        Accurate as of Sep 05, 2022 11:59 PM. If you have any questions, ask your nurse or doctor.          STOP taking these medications    Azelastine HCl 137 MCG/SPRAY Soln Stopped by: Carman Ching, PA-C   mometasone 50 MCG/ACT nasal spray Commonly known as: NASONEX Stopped by: Carman Ching, PA-C   nitrofurantoin (macrocrystal-monohydrate) 100 MG capsule Commonly known as: MACROBID Stopped by: Carman Ching, PA-C       TAKE these medications    CALCIUM 1200 PO Take 2-3 tablets by mouth daily. Reported on 05/26/2015   cholecalciferol 10 MCG (400 UNIT) Tabs tablet Commonly known  as: VITAMIN D3 Take 400 Units by mouth daily.   MULTIVITAMIN ADULT PO Take 1 tablet by mouth daily.   nitroGLYCERIN 0.4 MG SL tablet Commonly known as: NITROSTAT Place 1 tablet (0.4 mg total) under the tongue every 5 (five) minutes as needed for up to 25 days for chest pain (Maximum of 3 doses.).   pantoprazole 40 MG tablet Commonly known as: PROTONIX Take 1 tablet (40 mg total) by mouth 2 (two) times daily before a meal.   polyethylene glycol 17 g packet Commonly known as: MIRALAX / GLYCOLAX Take 17 g by mouth daily.   PROBIOTIC DAILY PO Take 1 tablet by mouth daily.   psyllium 0.52 g  capsule Commonly known as: REGULOID Take 0.52 g by mouth daily.   traMADol 50 MG tablet Commonly known as: ULTRAM Take 1 tablet (50 mg total) by mouth 3 (three) times daily as needed.   ZINC OXIDE PO Take 1 tablet by mouth daily.        Allergies:  Allergies  Allergen Reactions   Milk (Cow) Other (See Comments)   Milk-Related Compounds    Omeprazole Other (See Comments)    Pt reports "Chest pain, HA and Dysuria." 01/05/22 patient states she is able to take omeprazole now without a reaction   Penicillins Itching and Rash    Family History: Family History  Problem Relation Age of Onset   Arthritis Mother    Heart disease Mother    Stroke Mother        TIA   Hypertension Mother    Diabetes Mother    Kidney cancer Mother 46   Heart failure Mother    Hyperlipidemia Mother    Colon cancer Father 71   Arthritis Father    Hyperlipidemia Father    Transient ischemic attack Father    Cancer - Colon Father 33   Prostate cancer Father 72   Valvular heart disease Father    Cancer Maternal Grandmother    Cancer Paternal Grandfather        prostate   Breast cancer Cousin 50       has contact    Social History:   reports that she has never smoked. She has never been exposed to tobacco smoke. She has never used smokeless tobacco. She reports that she does not drink alcohol and does not use drugs.  Physical Exam: BP 106/71   Pulse 72   Ht 5\' 10"  (1.778 m)   Wt 126 lb (57.2 kg)   LMP 11/03/2021 (Exact Date)   BMI 18.08 kg/m   Unable to assess  Laboratory Data: Results for orders placed or performed in visit on 09/05/22  Microscopic Examination   Urine  Result Value Ref Range   WBC, UA 0-5 0 - 5 /hpf   RBC, Urine 0-2 0 - 2 /hpf   Epithelial Cells (non renal) 0-10 0 - 10 /hpf   Bacteria, UA Many (A) None seen/Few  Urinalysis, Complete  Result Value Ref Range   Specific Gravity, UA 1.010 1.005 - 1.030   pH, UA 7.0 5.0 - 7.5   Color, UA Yellow Yellow    Appearance Ur Clear Clear   Leukocytes,UA Negative Negative   Protein,UA Negative Negative/Trace   Glucose, UA Negative Negative   Ketones, UA Negative Negative   RBC, UA Negative Negative   Bilirubin, UA Negative Negative   Urobilinogen, Ur 0.2 0.2 - 1.0 mg/dL   Nitrite, UA Positive (A) Negative   Microscopic Examination See below:  BLADDER SCAN AMB NON-IMAGING  Result Value Ref Range   Scan Result    Assessment & Plan:   1. Recurrent UTI She was catheterized for a specimen today. I was running late in clinic due to an emergency and offered to contact her with her results when available, and she accepted. I did not personally see her today, will complete visit electronically. - Urinalysis, Complete - BLADDER SCAN AMB NON-IMAGING  Return for Will call with results.  Carman Ching, PA-C  [facility] [place] [address] [place], [place] [address] [phone]

## 2022-09-06 NOTE — Assessment & Plan Note (Addendum)
Ultrasound renal 11/08/2021 without mass. CTA revealed 7 mm hypodensity in the superior pole right renal cortex which is too small to characterize, but most likely represents a cyst.  Left cyst appears to be new based on prior US ultrasound.  Ordered renal ultrasound renal to complete workup.

## 2022-09-06 NOTE — Assessment & Plan Note (Signed)
Reassuring neurologic exam.  Patient no longer on NSAIDs.  I do question rebound headache from previous NSAID use.  No alarm features at this time.  Advised magnesium citrate 40 mg daily.

## 2022-09-06 NOTE — Progress Notes (Signed)
Assessment & Plan:  Upper GI bleed Assessment & Plan: Reviewed hospitalization.  Medications reconciled.  Discussed incidental findings specifically possible cecal mass.She reports she has rectal exam during hospitalization and has contacted [facility] gastroenterology, Dr Mia Creek, for follow-up in this regard.  She will let me know she does not get an appointment scheduled promptly.  No longer on NSAIDs.  Continue Protonix 40 mg twice daily. We also discussed incidental nonspecific lymph node seen CTA.  I placed a referral to oncology for further evaluation.  Orders: -     Basic metabolic panel  Renal mass Assessment & Plan: Ultrasound renal 11/08/2021 without mass. CTA revealed 7 mm hypodensity in the superior pole right renal cortex which is too small to characterize, but most likely represents a cyst.  Left cyst appears to be new based on prior US ultrasound.  Ordered renal ultrasound renal to complete workup.   Orders: -     US RENAL; Future  Abnormal CT of the chest -     Ambulatory referral to Hematology / Oncology     Return precautions given.   Risks, benefits, and alternatives of the medications and treatment plan prescribed today were discussed, and patient expressed understanding.   Education regarding symptom management and diagnosis given to patient on AVS either electronically or printed.  Return in about 1 month (around 10/07/2022).  Rennie Plowman, FNP  Subjective:    Patient ID: Cristina Wade, female    DOB: 20-Feb-1971, 52 y.o.   MRN: [id number]  CC: Cristina Wade is a 52 y.o. female who presents today for hospital follow up.   HPI: She complains of daily HA x 10 days, daily.    No vision loss, vomiting , photophobia, nausea.  Headache does not awaken her from sleep HA is not worse HA of life.   She thinks stress contributory.    No nsaid use.  She taking tylenol for ankle pain. She hasn't taken tramadol as prescribed during  hospitalization   Eating  and drinking well. No caffeine.   She reports she has rectal exam during hospitalization and has contacted [facility] gastroenterology for follow-up in this regard.   .  Patient admitted 08/30/2022 and discharged the following day for upper GI bleed. Discharge due to upper GI bleeding due to NSAIDs. She had previously been taking ibuprofen 800 mg 3 times daily for the last week.  She presented with epigastric pain, coffee-ground emesis Hospital course significant for IV PPI.  Hemoglobin stable.  Obtained EGD which showed NSAID induced gastritis.  Advance diet as tolerated . Discharged on  pantoprazole 40 mg BID  CT abdomen and pelvis with possible cecal mass versus stool ball.  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  Emphysema.  Bilateral micro nephrolithiasis CT head without acute intracranial findings.  Mild cerebral atrophy  Follow-up Dr. Mia Creek, awaiting appointment to be scheduled  Recent h/o  left ankle fracture  Never smoker  Eye exam is UTD  Ultrasound renal 11/08/2021 without mass. Allergies: Milk (cow), Milk-related compounds, Omeprazole, and Penicillins Current Outpatient Medications on File Prior to Visit  Medication Sig Dispense Refill   Calcium Carbonate-Vit D-Min (CALCIUM 1200 PO) Take 2-3 tablets by mouth daily. Reported on 05/26/2015     cholecalciferol (VITAMIN D3) 10 MCG (400 UNIT) TABS tablet Take 400 Units by mouth daily.  Multiple Vitamin (MULTIVITAMIN ADULT PO) Take 1 tablet by mouth daily.     pantoprazole (PROTONIX) 40 MG tablet Take 1 tablet (40 mg total) by mouth 2 (two) times daily before a meal. 60 tablet 1   polyethylene glycol (MIRALAX / GLYCOLAX) 17 g packet Take 17 g by mouth daily.     Probiotic Product (PROBIOTIC DAILY PO) Take 1 tablet by mouth daily.     psyllium (REGULOID)  0.52 g capsule Take 0.52 g by mouth daily.     ZINC OXIDE PO Take 1 tablet by mouth daily.     Current Facility-Administered Medications on File Prior to Visit  Medication Dose Route Frequency Provider Last Rate Last Admin   lidocaine (XYLOCAINE) 2 % jelly 1 application  1 application  Urethral Once Sondra Come, MD        Review of Systems  Constitutional:  Negative for chills and fever.  Eyes:  Negative for visual disturbance.  Respiratory:  Negative for cough.   Cardiovascular:  Negative for chest pain and palpitations.  Gastrointestinal:  Negative for nausea and vomiting.  Neurological:  Positive for headaches.      Objective:    BP 110/70   Pulse 74   Temp 97.8 F (36.6 C) (Oral)   Ht 5\' 10"  (1.778 m)   Wt 126 lb 3.2 oz (57.2 kg)   LMP 11/03/2021 (Exact Date)   SpO2 98%   BMI 18.11 kg/m  BP Readings from Last 3 Encounters:  09/06/22 110/70  09/05/22 106/71  08/31/22 116/72   Wt Readings from Last 3 Encounters:  09/06/22 126 lb 3.2 oz (57.2 kg)  09/05/22 126 lb (57.2 kg)  08/30/22 126 lb (57.2 kg)    Physical Exam Vitals reviewed.  Constitutional:      Appearance: She is well-developed.  HENT:     Mouth/Throat:     Pharynx: Uvula midline.  Eyes:     Conjunctiva/sclera: Conjunctivae normal.     Pupils: Pupils are equal, round, and reactive to light.     Comments: Fundus normal bilaterally.   Cardiovascular:     Rate and Rhythm: Normal rate and regular rhythm.     Pulses: Normal pulses.     Heart sounds: Normal heart sounds.  Pulmonary:     Effort: Pulmonary effort is normal.     Breath sounds: Normal breath sounds. No wheezing, rhonchi or rales.  Skin:    General: Skin is warm and dry.  Neurological:     Mental Status: She is alert.     Cranial Nerves: No cranial nerve deficit.     Sensory: No sensory deficit.     Deep Tendon Reflexes:     Reflex Scores:      Bicep reflexes are 2+ on the right side and 2+ on the left side.      Patellar  reflexes are 2+ on the right side and 2+ on the left side.    Comments: Grip equal and strong bilateral upper extremities. Gait strong and steady. Able to perform rapid alternating movement without difficulty.   Psychiatric:        Speech: Speech normal.        Behavior: Behavior normal.        Thought Content: Thought content normal.

## 2022-09-06 NOTE — Patient Instructions (Addendum)
Referral to oncology and ultrasound of kidneys  Start magnesium citrate 400mg  daily for headaches.    Often as a daily preventative, I recommend taking magnesium citrate with riboflavin 400mg  daily and CoQ10 100mg  three times daily. You may find these supplements over the counter.    Please let me know how you are doing.   Let us know if you dont hear back within a week in regards to an appointment being scheduled.   So that you are aware, if you are [facility] MyChart user , please pay attention to your MyChart messages as you may receive a MyChart message with a phone number to call and schedule this test/appointment own your own from our referral coordinator. This is a new process so I do not want you to miss this message.  If you are not a MyChart user, you will receive a phone call.

## 2022-09-06 NOTE — Assessment & Plan Note (Signed)
Reviewed hospitalization.  Medications reconciled.  Discussed incidental findings specifically possible cecal mass.She reports she has rectal exam during hospitalization and has contacted [facility] gastroenterology, Dr Mia Creek, for follow-up in this regard.  She will let me know she does not get an appointment scheduled promptly.  No longer on NSAIDs.  Continue Protonix 40 mg twice daily. We also discussed incidental nonspecific lymph node seen CTA.  I placed a referral to oncology for further evaluation.

## 2022-09-07 LAB — T3, FREE: T3, Free: 2.7 pg/mL (ref 2.0–4.4)

## 2022-09-12 ENCOUNTER — Inpatient Hospital Stay: Payer: BC Managed Care – PPO | Attending: Oncology

## 2022-09-12 ENCOUNTER — Inpatient Hospital Stay: Payer: BC Managed Care – PPO | Admitting: Oncology

## 2022-09-12 ENCOUNTER — Ambulatory Visit: Payer: BC Managed Care – PPO | Admitting: "Endocrinology

## 2022-09-12 ENCOUNTER — Encounter: Payer: Self-pay | Admitting: Oncology

## 2022-09-12 VITALS — BP 96/71 | HR 78 | Temp 97.3°F | Resp 18 | Wt 123.1 lb

## 2022-09-12 DIAGNOSIS — Z8051 Family history of malignant neoplasm of kidney: Secondary | ICD-10-CM

## 2022-09-12 DIAGNOSIS — R590 Localized enlarged lymph nodes: Secondary | ICD-10-CM | POA: Insufficient documentation

## 2022-09-12 DIAGNOSIS — K5900 Constipation, unspecified: Secondary | ICD-10-CM

## 2022-09-12 DIAGNOSIS — Z79899 Other long term (current) drug therapy: Secondary | ICD-10-CM

## 2022-09-12 DIAGNOSIS — Z803 Family history of malignant neoplasm of breast: Secondary | ICD-10-CM | POA: Diagnosis not present

## 2022-09-12 DIAGNOSIS — Z8042 Family history of malignant neoplasm of prostate: Secondary | ICD-10-CM | POA: Insufficient documentation

## 2022-09-12 DIAGNOSIS — Z800 Family history of malignant neoplasm of digestive organs: Secondary | ICD-10-CM

## 2022-09-12 DIAGNOSIS — Z809 Family history of malignant neoplasm, unspecified: Secondary | ICD-10-CM

## 2022-09-12 DIAGNOSIS — K6389 Other specified diseases of intestine: Secondary | ICD-10-CM

## 2022-09-12 NOTE — Assessment & Plan Note (Signed)
She is interested in genetic testing.  I will refer her to genetic counselor.

## 2022-09-12 NOTE — Assessment & Plan Note (Signed)
?    Stool versus renal mass.  Pending PET scan. Recommend patient to follow-up with gastroenterology

## 2022-09-12 NOTE — Progress Notes (Signed)
Hematology/Oncology Consult note [phone] [phone]        REFERRING PROVIDER: [name], FNP   CHIEF COMPLAINTS/REASON FOR VISIT:  Evaluation of abnormal CT scan, lymphadenopathy.  Cecal mass.   ASSESSMENT & PLAN:   Thoracic lymphadenopathy Nonspecific finding of single enlargement of subcarinal lymph node, 1.4 x 3.1 cm.  No primary lung lesion.  Non-smoker. Recommend PET scan for further evaluation. If positive, recommend biopsy.  If negative, recommend observation repeat CT scan in 6 months.  Family history of cancer She is interested in genetic testing.  I will refer her to genetic counselor.  Cecum mass ?  Stool versus renal mass.  Pending PET scan. Recommend patient to follow-up with gastroenterology   Orders Placed This Encounter  Procedures   NM PET Image Initial (PI) Skull Base To Thigh    Standing Status:   Future    Standing Expiration Date:   09/12/2023    Order Specific Question:   If indicated for the ordered procedure, I authorize the administration of a radiopharmaceutical per Radiology protocol    Answer:   Yes    Order Specific Question:   Is the patient pregnant?    Answer:   No    Order Specific Question:   Preferred imaging location?    Answer:   [facility]   Ambulatory referral to Genetics    Referral Priority:   Routine    Referral Type:   Consultation    Referral Reason:   Specialty Services Required    Number of Visits Requested:   1   Follow-up a few days after PET scan to go over results and management plan. All questions were answered. The patient knows to call the clinic with any problems, questions or concerns.  Rickard Patience, MD, PhD [facility] Hematology Oncology 09/12/2022   HISTORY OF PRESENTING ILLNESS:   Cristina Wade is a  52 y.o.  female with PMH listed below was seen in consultation at the request of  [name], FNP  for evaluation of abnormal CT scan  08/30/2022-08/31/2022 patient  was hospitalized due to upper GI bleeding after taking NSAIDs for ankle pain. She initially presented with chest pain and hematemesis.  Inpatient EGD showed NSAIDs induced gastritis.  Patient was treated with PPI. 08/31/2022 CT chest abdomen pelvis showed prominent nonspecific subcarinal lymph node measuring 1.4 cm x 3.1 cm.  Emphysema 4.7 cm mass of either nonaerated stool or a true mucosal mass in the cecum. Nonobstructive bilateral micronephrolithiasis. Constipation and diverticulosis.  Patient was referred to oncology for further evaluation.  She was accompanied by her sister. Denies any history of smoking. Denies weight loss, fever, chills, fatigue, night sweats.  Denies shortness of breath, hemoptysis. Family history positive for breast cancer, colon cancer, kidney cancer, prostate cancer   MEDICAL HISTORY:  Past Medical History:  Diagnosis Date   Anxiety    Asthma    Chronic pelvic pain in female 2018   Cystitis    Cystocele    Endometrial polyp    Endometriosis    per pt report but never seen during surg   Frequent headaches    Gastritis    GERD (gastroesophageal reflux disease)    rare   Gross hematuria    History of kidney stones    Microscopic hematuria    Migraine    Migraines    Neuropathy    Osteopenia    Urinary disorder    UTI (lower urinary tract infection)  SURGICAL HISTORY: Past Surgical History:  Procedure Laterality Date   COLONOSCOPY N/A 11/05/2014   Procedure: COLONOSCOPY;  Surgeon: Scot Jun, MD;  Location: [facility] ENDOSCOPY;  Service: Endoscopy;  Laterality: N/A;   COLONOSCOPY  11/2016   Dr. Mechele Collin   CYSTOSCOPY  2007   with biopsy    CYSTOSCOPY N/A 06/01/2016   Procedure: CYSTOSCOPY;  Surgeon: Vena Austria, MD;  Location: [facility] ORS;  Service: Gynecology;  Laterality: N/A;   DIAGNOSTIC LAPAROSCOPY     DILATATION & CURETTAGE/HYSTEROSCOPY WITH MYOSURE N/A 04/13/2015   Procedure: DILATATION & CURETTAGE/HYSTEROSCOPY WITH  MYOSURE/POLYPECTOMY;  Surgeon: Elenora Fender Ward, MD;  Location: [facility] ORS;  Service: Gynecology;  Laterality: N/A;   DILATATION & CURETTAGE/HYSTEROSCOPY WITH MYOSURE N/A 06/01/2016   Procedure: DILATATION & CURETTAGE/HYSTEROSCOPY WITH MYOSURE;  Surgeon: Vena Austria, MD;  Location: [facility] ORS;  Service: Gynecology;  Laterality: N/A;   DILATION AND CURETTAGE OF UTERUS     ESOPHAGOGASTRODUODENOSCOPY (EGD) WITH PROPOFOL N/A 08/31/2022   Procedure: ESOPHAGOGASTRODUODENOSCOPY (EGD) WITH PROPOFOL;  Surgeon: Jaynie Collins, DO;  Location: [facility] ENDOSCOPY;  Service: Gastroenterology;  Laterality: N/A;   GUM SURGERY     laproscopy  2007   POLYPECTOMY     endometrial    SOCIAL HISTORY: Social History   Socioeconomic History   Marital status: Single    Spouse name: Not on file   Number of children: Not on file   Years of education: Not on file   Highest education level: Not on file  Occupational History   Not on file  Tobacco Use   Smoking status: Never    Passive exposure: Never   Smokeless tobacco: Never  Vaping Use   Vaping Use: Never used  Substance and Sexual Activity   Alcohol use: No   Drug use: No   Sexual activity: Not on file  Other Topics Concern   Not on file  Social History Narrative   Lives with twin sister   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    Social Determinants of Health   Financial Resource Strain: Low Risk  (09/12/2022)   Overall Financial Resource Strain (CARDIA)    Difficulty of Paying Living Expenses: Not very hard  Food Insecurity: No Food Insecurity (09/12/2022)   Hunger Vital Sign    Worried About Running Out of Food in the Last Year: Never true    Ran Out of Food in the Last Year: Never true  Transportation Needs: No Transportation Needs (09/12/2022)   PRAPARE - Scientist, research (physical sciences) (Medical): No    Lack of Transportation (Non-Medical): No  Physical Activity: Not on file  Stress: No Stress Concern Present (09/12/2022)   [employer] of Occupational Health - Occupational Stress Questionnaire    Feeling of Stress : Not at all  Social Connections: Not on file  Intimate Partner Violence: Not At Risk (09/12/2022)   Humiliation, Afraid, Rape, and Kick questionnaire    Fear of Current or Ex-Partner: No    Emotionally Abused: No    Physically Abused: No    Sexually Abused: No    FAMILY HISTORY: Family History  Problem Relation Age of Onset   Arthritis Mother    Heart disease Mother    Stroke Mother        TIA  Hypertension Mother    Diabetes Mother    Kidney cancer Mother 41   Heart failure Mother    Hyperlipidemia Mother    Colon cancer Father 57   Arthritis Father    Hyperlipidemia Father    Transient ischemic attack Father    Cancer - Colon Father 74   Prostate cancer Father 16   Valvular heart disease Father    Cancer Maternal Grandmother    Cancer Paternal Grandfather        prostate   Breast cancer Cousin 39       has contact    ALLERGIES:  is allergic to milk (cow), milk-related compounds, omeprazole, and penicillins.  MEDICATIONS:  Current Outpatient Medications  Medication Sig Dispense Refill   Calcium Carbonate-Vit D-Min (CALCIUM 1200 PO) Take 2-3 tablets by mouth daily. Reported on 05/26/2015     cholecalciferol (VITAMIN D3) 10 MCG (400 UNIT) TABS tablet Take 400 Units by mouth daily.     Multiple Vitamin (MULTIVITAMIN ADULT PO) Take 1 tablet by mouth daily.     pantoprazole (PROTONIX) 40 MG tablet Take 1 tablet (40 mg total) by mouth 2 (two) times daily before a meal. 60 tablet 1   polyethylene glycol (MIRALAX / GLYCOLAX) 17 g packet Take 17 g by mouth daily.     Probiotic Product (PROBIOTIC DAILY PO) Take 1 tablet by mouth daily.     psyllium (REGULOID) 0.52 g capsule Take 0.52 g by mouth daily.      sulfamethoxazole-trimethoprim (BACTRIM DS) 800-160 MG tablet Take 1 tablet by mouth 2 (two) times daily for 7 days. 14 tablet 0   ZINC OXIDE PO Take 1 tablet by mouth daily.     Current Facility-Administered Medications  Medication Dose Route Frequency Provider Last Rate Last Admin   lidocaine (XYLOCAINE) 2 % jelly 1 application  1 application  Urethral Once Sondra Come, MD        Review of Systems  Constitutional:  Negative for appetite change, chills, fatigue and fever.  HENT:   Negative for hearing loss and voice change.   Eyes:  Negative for eye problems.  Respiratory:  Negative for chest tightness and cough.   Cardiovascular:  Negative for chest pain.  Gastrointestinal:  Negative for abdominal distention, abdominal pain and blood in stool.  Endocrine: Negative for hot flashes.  Genitourinary:  Negative for difficulty urinating and frequency.   Musculoskeletal:  Negative for arthralgias.  Skin:  Negative for itching and rash.  Neurological:  Negative for extremity weakness.  Hematological:  Negative for adenopathy.  Psychiatric/Behavioral:  Negative for confusion.    PHYSICAL EXAMINATION: ECOG PERFORMANCE STATUS: 0 - Asymptomatic Vitals:   09/12/22 1527  BP: 96/71  Pulse: 78  Resp: 18  Temp: (!) 97.3 F (36.3 C)  SpO2: 100%   Filed Weights   09/12/22 1527  Weight: 123 lb 1.6 oz (55.8 kg)    Physical Exam Constitutional:      General: She is not in acute distress. HENT:     Head: Normocephalic and atraumatic.  Eyes:     General: No scleral icterus. Cardiovascular:     Rate and Rhythm: Normal rate and regular rhythm.     Heart sounds: Normal heart sounds.  Pulmonary:     Effort: Pulmonary effort is normal. No respiratory distress.     Breath sounds: No wheezing.  Abdominal:     General: Bowel sounds are normal. There is no distension.     Palpations: Abdomen is soft.  Musculoskeletal:  General: No deformity. Normal range of motion.     Cervical  back: Normal range of motion and neck supple.  Skin:    General: Skin is warm and dry.     Findings: No erythema or rash.  Neurological:     Mental Status: She is alert and oriented to person, place, and time. Mental status is at baseline.     Cranial Nerves: No cranial nerve deficit.     Coordination: Coordination normal.  Psychiatric:     Comments: Flat affect     LABORATORY DATA:  I have reviewed the data as listed    Latest Ref Rng & Units 08/31/2022    6:30 AM 08/30/2022    8:53 PM 01/14/2022   11:37 PM  CBC  WBC 4.0 - 10.5 K/uL 4.9  7.9  6.8   Hemoglobin 12.0 - 15.0 g/dL 16.1  09.6  04.5   Hematocrit 36.0 - 46.0 % 37.1  39.7  42.9   Platelets 150 - 400 K/uL 180  196  207       Latest Ref Rng & Units 09/06/2022   12:12 PM 08/31/2022    6:30 AM 08/30/2022    8:53 PM  CMP  Glucose 70 - 99 mg/dL 91  96  409   BUN 6 - 23 mg/dL 16  14  14    Creatinine 0.40 - 1.20 mg/dL 8.11  9.14  7.82   Sodium 135 - 145 mEq/L 140  139  137   Potassium 3.5 - 5.1 mEq/L 4.7  3.2  3.7   Chloride 96 - 112 mEq/L 103  107  102   CO2 19 - 32 mEq/L 30  25  26    Calcium 8.4 - 10.5 mg/dL 9.6  8.8  9.2   Total Protein 6.5 - 8.1 g/dL   6.7   Total Bilirubin 0.3 - 1.2 mg/dL   0.5   Alkaline Phos 38 - 126 U/L   55   AST 15 - 41 U/L   23   ALT 0 - 44 U/L   21       RADIOGRAPHIC STUDIES: I have personally reviewed the radiological images as listed and agreed with the findings in the report. CT HEAD WO CONTRAST ( )  Result Date: 08/31/2022 CLINICAL DATA:  Headache, neurologic deficit. EXAM: CT HEAD WITHOUT CONTRAST TECHNIQUE: Contiguous axial images were obtained from the base of the skull through the vertex without intravenous contrast. RADIATION DOSE REDUCTION: This exam was performed according to the departmental dose-optimization program which includes automated exposure control, adjustment of the mA and/or kV according to patient size and/or use of iterative reconstruction technique. COMPARISON:   Head CT 01/20/2021 FINDINGS: Brain: No evidence of acute infarction, hemorrhage, hydrocephalus, extra-axial collection or mass lesion/mass effect. There is mild cerebral cortical atrophy, without findings of significant small-vessel disease. Slight cerebellar atrophy. Basal cisterns are clear. Vascular: No hyperdense vessel or unexpected calcification. Skull: Negative for fractures or focal lesions. Sinuses/Orbits: Negative visualized orbits, visualized sinuses and mastoid air cells. Midline nasal septum. Other: None. IMPRESSION: 1. No acute intracranial CT findings. 2. Mild cerebral and slight cerebellar atrophy. Electronically Signed   By: Almira Bar M.D.   On: 08/31/2022 01:33   CT Angio Chest/Abd/Pel for Dissection W and/or Wo Contrast  Result Date: 08/31/2022 CLINICAL DATA:  Chest pain and hematemesis. Acute aortic syndrome suspected. EXAM: CT ANGIOGRAPHY CHEST, ABDOMEN AND PELVIS TECHNIQUE: Non-contrast CT of the chest was initially obtained. Multidetector CT imaging through the chest, abdomen and  pelvis was performed using the standard protocol during bolus administration of intravenous contrast. Multiplanar reconstructed images and MIPs were obtained and reviewed to evaluate the vascular anatomy. RADIATION DOSE REDUCTION: This exam was performed according to the departmental dose-optimization program which includes automated exposure control, adjustment of the mA and/or kV according to patient size and/or use of iterative reconstruction technique. CONTRAST:  OMNIPAQUE IOHEXOL 350 MG/ML SOLN COMPARISON:  PA and lateral chest today, PA and lateral chest 01/14/2022, CT abdomen and pelvis no contrast 10/04/2020, CT abdomen and pelvis with contrast 10/26/2019 FINDINGS: CTA CHEST FINDINGS Cardiovascular: The cardiac size is normal. There is no pericardial effusion. There are no visible coronary artery calcifications. The aorta and great vessels are normal. There are no visible plaques. The pulmonary  arteries and veins are normal in caliber. Pulmonary arteries are centrally clear on this nondedicated study. Mediastinum/Nodes: There is a single prominent nonspecific subcarinal lymph node measuring 1.4 x 3.1 cm on 5:83. No other intrathoracic adenopathy is seen. Axillary spaces are free of adenopathy. The lower poles of the thyroid gland are unremarkable. The thoracic trachea, both of the main bronchi and the thoracic esophagus are unremarkable. Lungs/Pleura: The lungs are mildly emphysematous with centrilobular changes predominating in the upper lobes. There are symmetric bilateral apical pleuroparenchymal scar-like opacities. There are no nodules or infiltrates. There is no pleural effusion, thickening or pneumothorax. Musculoskeletal: No chest wall abnormality. No acute or significant osseous findings. Review of the MIP images confirms the above findings. CTA ABDOMEN AND PELVIS FINDINGS VASCULAR Aorta: Normal. Celiac: Normal. SMA: Normal. Renals: Both are single.  Both are normal.  No branch occlusion. IMA: Normal. Inflow and proximal outflow: Patent without evidence of aneurysm, dissection, vasculitis or significant stenosis. Veins: No obvious venous abnormality within the limitations of this arterial phase study. Review of the MIP images confirms the above findings. 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 No other focal abnormality of the liver is seen. The gallbladder and bile ducts are unremarkable. Pancreas: No abnormality. Spleen: No abnormality.  No splenomegaly. Adrenals/Urinary Tract: No adrenal mass. Again noted a 7 mm hypodensity in the superior pole right renal cortex which is too small to characterize, but most likely represents a cyst. No follow-up imaging is recommended. For reference see JACR 2018 Feb; 264-273, Management of the Incidental Renal Mass on CT, [employer] 2021; 814-848, Bosniak Classification of  Cystic Renal Masses, Version 2019. Both kidneys are otherwise unremarkable. There are scattered punctate nonobstructive caliceal stones in the inferior pole of both kidneys but no obstructing stones or hydronephrosis. There is no bladder thickening. Stomach/Bowel: The stomach is unremarkable. The small bowel is normal caliber. There are thickened folds in the left abdominal jejunal segments consistent with nonspecific enteritis. Remainder of the small bowel is unremarkable. The appendix is normal. There is a 4.7 cm mass of either nonaerated stool or a true mucosal mass in the cecum. Further evaluation is recommended. There is mild fecal stasis. Colonic diverticulosis noted without evidence of diverticulitis. Lymphatic: No abdominal or pelvic adenopathy is seen. Reproductive: Uterus and bilateral adnexa are unremarkable. Other: Multiple pelvic phleboliths. No free air, free fluid, free hemorrhage or incarcerated lymph nodes. Musculoskeletal: No acute or significant osseous findings. Mild hip DJD. Review of the MIP images confirms the above findings. IMPRESSION: 1. Normal aorta and branch vessels. 2. Single prominent nonspecific subcarinal lymph node measuring 1.4 x 3.1 cm. No other adenopathy is seen. 3. Emphysema. 4.  4.7 cm mass of either nonaerated stool or a true mucosal mass in the cecum. Further evaluation is recommended. 5. Nonobstructive bilateral micronephrolithiasis. 6. Constipation and diverticulosis. Emphysema (ICD10-J43.9). Electronically Signed   By: Almira Bar M.D.   On: 08/31/2022 01:28   DG Chest 2 View  Result Date: 08/30/2022 CLINICAL DATA:  Chest pain EXAM: CHEST - 2 VIEW COMPARISON:  01/14/2022 FINDINGS: Hyperinflation. Heart and mediastinal contours are within normal limits. No focal opacities or effusions. No acute bony abnormality. IMPRESSION: Hyperinflation. No active cardiopulmonary disease. Electronically Signed   By: Charlett Nose M.D.   On: 08/30/2022 21:15   US RENAL  Result  Date: 08/29/2022 CLINICAL DATA:  Recurrent UTIs.  History of stones. EXAM: RENAL / URINARY TRACT ULTRASOUND COMPLETE COMPARISON:  Renal ultrasound, 11/08/2021.  CT, 10/04/2020. FINDINGS: Right Kidney: Renal measurements: 11.8 x 3.8 x 5.9 cm = volume: 136 mL. Echogenicity within normal limits. No mass or hydronephrosis visualized. Left Kidney: Renal measurements: 11.7 x 5.8 x 5.2 cm = volume: 182 mL. Echogenicity within normal limits. No mass or hydronephrosis visualized. Bladder: Appears normal for degree of bladder distention. Other: None. IMPRESSION: 1. Normal renal ultrasound. No change from prior renal ultrasound. Tiny intrarenal stones noted on the previous CT are not visualized sonographically. Electronically Signed   By: Amie Portland M.D.   On: 08/29/2022 16:42   MM 3D SCREEN BREAST BILATERAL  Result Date: 07/17/2022 CLINICAL DATA:  Screening. EXAM: DIGITAL SCREENING BILATERAL MAMMOGRAM WITH TOMOSYNTHESIS AND CAD TECHNIQUE: Bilateral screening digital craniocaudal and mediolateral oblique mammograms were obtained. Bilateral screening digital breast tomosynthesis was performed. The images were evaluated with computer-aided detection. COMPARISON:  Previous exam(s). ACR Breast Density Category d: The breasts are extremely dense, which lowers the sensitivity of mammography. FINDINGS: There are no findings suspicious for malignancy. IMPRESSION: No mammographic evidence of malignancy. A result letter of this screening mammogram will be mailed directly to the patient. RECOMMENDATION: Screening mammogram in one year. (Code:SM-B-01Y) BI-RADS CATEGORY  1: Negative. Electronically Signed   By: Baird Lyons M.D.   On: 07/17/2022 14:33

## 2022-09-12 NOTE — Assessment & Plan Note (Signed)
Nonspecific finding of single enlargement of subcarinal lymph node, 1.4 x 3.1 cm.  No primary lung lesion.  Non-smoker. Recommend PET scan for further evaluation. If positive, recommend biopsy.  If negative, recommend observation repeat CT scan in 6 months.

## 2022-09-13 ENCOUNTER — Ambulatory Visit
Admission: RE | Admit: 2022-09-13 | Discharge: 2022-09-13 | Disposition: A | Discharge status: Home / Self Care | Payer: BC Managed Care – PPO | Source: Ambulatory Visit | Attending: Family | Admitting: Family

## 2022-09-13 DIAGNOSIS — N2889 Other specified disorders of kidney and ureter: Secondary | ICD-10-CM | POA: Insufficient documentation

## 2022-09-14 ENCOUNTER — Encounter: Payer: Self-pay | Admitting: "Endocrinology

## 2022-09-14 ENCOUNTER — Encounter: Payer: Self-pay | Admitting: Family

## 2022-09-14 ENCOUNTER — Ambulatory Visit: Payer: BC Managed Care – PPO | Admitting: "Endocrinology

## 2022-09-14 VITALS — BP 90/58 | HR 68 | Ht 70.0 in | Wt 123.6 lb

## 2022-09-14 DIAGNOSIS — E061 Subacute thyroiditis: Secondary | ICD-10-CM | POA: Diagnosis not present

## 2022-09-14 NOTE — Telephone Encounter (Signed)
Spoke to patient and offered for her to come in and leave a urine to be sent for culture. She states she doesn't hurt all the time and would rather wait and see how it goes. She said if the pain comes back she will call back.

## 2022-09-14 NOTE — Progress Notes (Signed)
09/14/2022, 5:22 PM   Endocrinology follow-up note  Subjective:    Patient ID: Cristina Wade, female    DOB: 12-21-1970, PCP [name], FNP   Past Medical History:  Diagnosis Date   Anxiety    Asthma    Chronic pelvic pain in female 2018   Cystitis    Cystocele    Endometrial polyp    Endometriosis    per pt report but never seen during surg   Frequent headaches    Gastritis    GERD (gastroesophageal reflux disease)    rare   Gross hematuria    History of kidney stones    Microscopic hematuria    Migraine    Migraines    Neuropathy    Osteopenia    Urinary disorder    UTI (lower urinary tract infection)    Past Surgical History:  Procedure Laterality Date   COLONOSCOPY N/A 11/05/2014   Procedure: COLONOSCOPY;  Surgeon: Scot Jun, MD;  Location: [facility] ENDOSCOPY;  Service: Endoscopy;  Laterality: N/A;   COLONOSCOPY  11/2016   Dr. Mechele Collin   CYSTOSCOPY  2007   with biopsy    CYSTOSCOPY N/A 06/01/2016   Procedure: CYSTOSCOPY;  Surgeon: Vena Austria, MD;  Location: [facility] ORS;  Service: Gynecology;  Laterality: N/A;   DIAGNOSTIC LAPAROSCOPY     DILATATION & CURETTAGE/HYSTEROSCOPY WITH MYOSURE N/A 04/13/2015   Procedure: DILATATION & CURETTAGE/HYSTEROSCOPY WITH MYOSURE/POLYPECTOMY;  Surgeon: Elenora Fender Ward, MD;  Location: [facility] ORS;  Service: Gynecology;  Laterality: N/A;   DILATATION & CURETTAGE/HYSTEROSCOPY WITH MYOSURE N/A 06/01/2016   Procedure: DILATATION & CURETTAGE/HYSTEROSCOPY WITH MYOSURE;  Surgeon: Vena Austria, MD;  Location: [facility] ORS;  Service: Gynecology;  Laterality: N/A;   DILATION AND CURETTAGE OF UTERUS     ESOPHAGOGASTRODUODENOSCOPY (EGD) WITH PROPOFOL N/A 08/31/2022   Procedure: ESOPHAGOGASTRODUODENOSCOPY (EGD) WITH PROPOFOL;  Surgeon: Jaynie Collins, DO;  Location: [facility] ENDOSCOPY;  Service: Gastroenterology;  Laterality: N/A;   GUM SURGERY     laproscopy  2007    POLYPECTOMY     endometrial   Social History   Socioeconomic History   Marital status: Single    Spouse name: Not on file   Number of children: Not on file   Years of education: Not on file   Highest education level: Not on file  Occupational History   Not on file  Tobacco Use   Smoking status: Never    Passive exposure: Never   Smokeless tobacco: Never  Vaping Use   Vaping Use: Never used  Substance and Sexual Activity   Alcohol use: No   Drug use: No   Sexual activity: Not on file  Other Topics Concern   Not on file  Social History Narrative   Lives with twin sister   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    Social Determinants of Health   Financial Resource Strain: Low Risk  (09/12/2022)   Overall Financial Resource Strain (CARDIA)    Difficulty of Paying Living Expenses: Not very hard  Food Insecurity: No Food Insecurity (09/12/2022)   Hunger Vital Sign    Worried About Running Out of Food in the Last Year: Never true    Ran Out of Food in the Last Year: Never true  Transportation Needs: No Transportation Needs (09/12/2022)   PRAPARE - Administrator, Civil Service (Medical): No    Lack of Transportation (Non-Medical): No  Physical Activity: Not on file  Stress: No Stress Concern Present (09/12/2022)   [employer] of Occupational Health - Occupational Stress Questionnaire    Feeling of Stress : Not at all  Social Connections: Not on file   Family History  Problem Relation Age of Onset   Arthritis Mother    Heart disease Mother    Stroke Mother        TIA   Hypertension Mother    Diabetes Mother    Kidney cancer Mother 15   Heart failure Mother    Hyperlipidemia Mother    Colon cancer Father 52   Arthritis Father    Hyperlipidemia Father    Transient ischemic attack Father     Cancer - Colon Father 50   Prostate cancer Father 51   Valvular heart disease Father    Cancer Maternal Grandmother    Cancer Paternal Grandfather        prostate   Breast cancer Cousin 35       has contact   Outpatient Encounter Medications as of 09/14/2022  Medication Sig   Calcium Carbonate-Vit D-Min (CALCIUM 1200 PO) Take 2-3 tablets by mouth daily. Reported on 05/26/2015   cholecalciferol (VITAMIN D3) 10 MCG (400 UNIT) TABS tablet Take 400 Units by mouth daily.   Multiple Vitamin (MULTIVITAMIN ADULT PO) Take 1 tablet by mouth daily.   pantoprazole (PROTONIX) 40 MG tablet Take 1 tablet (40 mg total) by mouth 2 (two) times daily before a meal.   polyethylene glycol (MIRALAX / GLYCOLAX) 17 g packet Take 17 g by mouth daily.   Probiotic Product (PROBIOTIC DAILY PO) Take 1 tablet by mouth daily.   psyllium (REGULOID) 0.52 g capsule Take 0.52 g by mouth daily.   ZINC OXIDE PO Take 1 tablet by mouth daily.   Facility-Administered Encounter Medications as of 09/14/2022  Medication   lidocaine (XYLOCAINE) 2 % jelly 1 application   ALLERGIES: Allergies  Allergen Reactions   Milk (Cow) Other (See Comments)   Milk-Related Compounds    Omeprazole Other (See Comments)    Pt reports "Chest pain, HA and Dysuria." 01/05/22 patient states she is able to take omeprazole now without a reaction   Penicillins Itching and Rash    VACCINATION STATUS: Immunization History  Administered Date(s) Administered   Td 09/30/2015   Tdap 09/27/2005    HPI Cristina Wade is 52 y.o. female who presents today with a medical history as above. she is being seen in follow-up after she was seen in consultation  for subclinical hyperthyroidism requested by [name], FNP.  She was found to have suppressed TSH on 2 separate occasions, on November 14, 2021 0.04, on November 30, 2021 0.02.  she did have normal free T4 and free T3 in the August blood work. After her last visit, she was sent for thyroid uptake and  scan which was found to have a significant finding of suppressed uptake at 1.1% at 24 hours.  No new complaints today.  She reports feeling better in reversing her previous symptoms.  Her previsit thyroid function tests are consistent with  euthyroid presentation.    denies any prior history of thyroid dysfunction.  She has a steady weight since last visit.   Denies anxiety, palpitations, nor heat intolerance.    She denies any family history of thyroid dysfunction or thyroid malignancy.  She is not taking any thyroid hormone supplements nor antithyroid medications.   Review of Systems  Constitutional: no fatigue, - subjective hyperthermia, no subjective hypothermia Eyes: no blurry vision, no xerophthalmia   Objective:       09/14/2022    3:37 PM 09/12/2022    3:27 PM 09/06/2022   11:28 AM  Vitals with BMI  Height 5\' 10"   5\' 10"   Weight 123 lbs 10 oz 123 lbs 2 oz 126 lbs 3 oz  BMI 17.73 17.66 18.11  Systolic 90 96 110  Diastolic 58 71 70  Pulse 68 78 74    BP (!) 90/58   Pulse 68   Ht 5\' 10"  (1.778 m)   Wt 123 lb 9.6 oz (56.1 kg)   LMP 11/03/2021 (Exact Date)   BMI 17.73 kg/m   Wt Readings from Last 3 Encounters:  09/14/22 123 lb 9.6 oz (56.1 kg)  09/12/22 123 lb 1.6 oz (55.8 kg)  09/06/22 126 lb 3.2 oz (57.2 kg)    Physical Exam  Constitutional:  Body mass index is 17.73 kg/m.,  not in acute distress, normal state of mind   CMP ( most recent) CMP     Component Value Date/Time   NA 140 09/06/2022 1212   K 4.7 09/06/2022 1212   CL 103 09/06/2022 1212   CO2 30 09/06/2022 1212   GLUCOSE 91 09/06/2022 1212   BUN 16 09/06/2022 1212   CREATININE 0.72 09/06/2022 1212   CALCIUM 9.6 09/06/2022 1212   PROT 6.7 08/30/2022 2053   ALBUMIN 4.2 08/30/2022 2053   AST 23 08/30/2022 2053   ALT 21 08/30/2022 2053   ALKPHOS 55 08/30/2022 2053   BILITOT 0.5 08/30/2022 2053   GFRNONAA >60 08/31/2022 0630   GFRAA >60 12/11/2019 1524     Diabetic Labs (most recent): Lab  Results  Component Value Date   HGBA1C 5.7 11/14/2021   HGBA1C 5.8 04/15/2021   HGBA1C 5.7 11/10/2020     Lipid Panel ( most recent) Lipid Panel     Component Value Date/Time   CHOL 157 11/14/2021 0814   TRIG 48.0 11/14/2021 0814   HDL 61.70 11/14/2021 0814   CHOLHDL 3 11/14/2021 0814   VLDL 9.6 11/14/2021 0814   LDLCALC 86 11/14/2021 0814      Lab Results  Component Value Date   TSH 1.449 09/06/2022   TSH 1.961 03/01/2022   TSH 0.02 (L) 11/30/2021   TSH 0.04 (L) 11/14/2021   TSH 1.30 04/15/2021   TSH 1.95 11/10/2020   TSH 1.46 06/03/2018   TSH 1.79 11/29/2016   FREET4 0.86 09/06/2022   FREET4 0.90 03/01/2022   FREET4 1.47 11/30/2021       Assessment & Plan:   Subacute thyroiditis  -Reviewed her thyroid uptake and scan with her.  24-hour uptake is only 1.1%.  Her findings are consistent with resolved subacute thyroiditis.  In light of her presentation with euthyroid state, she will not need intervention with thyroid hormone nor antithyroid medications.  She will return to clinic as needed.   - I did not initiate any new prescriptions today. - she is advised to maintain close follow up with [name], FNP for primary care needs.  I spent  20  minutes in the care of the patient today including review of labs from Thyroid Function, CMP, and other relevant labs ; imaging/biopsy records (current and previous including abstractions from other facilities); face-to-face time discussing  her lab results and symptoms, medications doses, her options of short and long term treatment based on the latest standards of care / guidelines;   and documenting the encounter.  Cristina Wade  participated in the discussions, expressed understanding, and voiced agreement with the above plans.  All questions were answered to her satisfaction. she is encouraged to contact clinic should she have any questions or concerns prior to her return visit.   Follow up plan: Return if symptoms  worsen or fail to improve.   Marquis Lunch, MD [facility] [place] [address] [place], [place] [address] Phone: [phone]  Fax: [phone]     09/14/2022, 5:22 PM  This note was partially dictated with voice recognition software. Similar sounding words can be transcribed inadequately or may not  be corrected upon review.

## 2022-09-15 ENCOUNTER — Telehealth: Payer: Self-pay | Admitting: Oncology

## 2022-09-15 NOTE — Telephone Encounter (Signed)
noted 

## 2022-09-15 NOTE — Telephone Encounter (Signed)
Pt called to move out her appt. To review PET results with Dr. Cathie Hoops. Pt states she will be out of town. Pt. Offered next available appt. Which is 6/26 and wanted all of her appts on the same day as genetic counseling   Routing to clinical team to make aware.

## 2022-09-18 ENCOUNTER — Encounter: Payer: Self-pay | Admitting: Family

## 2022-09-18 ENCOUNTER — Telehealth: Payer: Self-pay | Admitting: Licensed Clinical Social Worker

## 2022-09-18 NOTE — Telephone Encounter (Signed)
Called patient to r/s genetics appointment to 6/27 since I will be out on 6/20. Patient prefers to be seen at 1 pm.

## 2022-09-25 ENCOUNTER — Encounter
Admission: RE | Admit: 2022-09-25 | Discharge: 2022-09-25 | Disposition: A | Discharge status: Home / Self Care | Payer: BC Managed Care – PPO | Source: Ambulatory Visit | Attending: Oncology | Admitting: Oncology

## 2022-09-25 ENCOUNTER — Other Ambulatory Visit: Payer: Self-pay

## 2022-09-25 DIAGNOSIS — R590 Localized enlarged lymph nodes: Secondary | ICD-10-CM | POA: Insufficient documentation

## 2022-09-25 DIAGNOSIS — N200 Calculus of kidney: Secondary | ICD-10-CM

## 2022-09-25 LAB — GLUCOSE, CAPILLARY: Glucose-Capillary: 71 mg/dL (ref 70–99)

## 2022-09-25 MED ORDER — FLUDEOXYGLUCOSE F - 18 (FDG) INJECTION
6.4000 | Freq: Once | INTRAVENOUS | Status: AC | PRN
  Administered 2022-09-25: 6.85 via INTRAVENOUS

## 2022-09-26 ENCOUNTER — Encounter: Payer: Self-pay | Admitting: Physician Assistant

## 2022-09-26 ENCOUNTER — Ambulatory Visit
Admission: RE | Admit: 2022-09-26 | Discharge: 2022-09-26 | Disposition: A | Discharge status: Home / Self Care | Payer: BC Managed Care – PPO | Source: Ambulatory Visit | Attending: Physician Assistant | Admitting: Physician Assistant

## 2022-09-26 ENCOUNTER — Ambulatory Visit: Payer: BC Managed Care – PPO | Admitting: Physician Assistant

## 2022-09-26 VITALS — BP 110/63 | HR 70 | Ht 70.0 in | Wt 123.0 lb

## 2022-09-26 DIAGNOSIS — N200 Calculus of kidney: Secondary | ICD-10-CM

## 2022-09-26 DIAGNOSIS — N319 Neuromuscular dysfunction of bladder, unspecified: Secondary | ICD-10-CM

## 2022-09-26 LAB — MICROSCOPIC EXAMINATION

## 2022-09-26 LAB — URINALYSIS, COMPLETE
Bilirubin, UA: NEGATIVE
Glucose, UA: NEGATIVE
Ketones, UA: NEGATIVE
Leukocytes,UA: NEGATIVE
Nitrite, UA: NEGATIVE
Protein,UA: NEGATIVE
RBC, UA: NEGATIVE
Specific Gravity, UA: 1.02 (ref 1.005–1.030)
Urobilinogen, Ur: 0.2 mg/dL (ref 0.2–1.0)
pH, UA: 5.5 (ref 5.0–7.5)

## 2022-09-26 NOTE — Progress Notes (Signed)
09/26/2022 12:01 PM   Cristina Wade May 27, 1970 [id number]  CC: Chief Complaint  Patient presents with   Nephrolithiasis   HPI: Cristina Wade is a 52 y.o. female with PMH high risk hematuria with benign workup in 2020 05/2021, neurogenic bladder managed with CIC 3 times daily, nephrolithiasis, and recurrent UTI versus colonization who presents today for evaluation of nephrolithiasis.  She is accompanied today by her sister, Cristina Wade, who contributes to HPI.  Her PCP recently ordered a renal ultrasound, which showed a total of 5 probable bilateral renal stones measuring up to 7.3 mm on the right and 5.8 mm on the left.  She is very concerned about the stones and wonders what we need to do about them.  Notably, she underwent a PET scan yesterday per Dr. Cathie Hoops.  Official read is pending, but I am only able to visualize one <62mm left lower pole stone as well as possible punctate right renal stones.  KUB today is rather poor quality and I am unable to appreciate any significant nephrolithiasis.  Additionally, she wonders about her CIC technique.  She has been inserting the catheter until urine starts flowing and then inserting a deeper to the point that she is hubbing it.  She is finding discomfort at the meatus from forcefully hubbing the catheter.  In-office UA and microscopy today pan negative.  PMH: Past Medical History:  Diagnosis Date   Anxiety    Asthma    Chronic pelvic pain in female 2018   Cystitis    Cystocele    Endometrial polyp    Endometriosis    per pt report but never seen during surg   Frequent headaches    Gastritis    GERD (gastroesophageal reflux disease)    rare   Gross hematuria    History of kidney stones    Microscopic hematuria    Migraine    Migraines    Neuropathy    Osteopenia    Urinary disorder    UTI (lower urinary tract infection)     Surgical History: Past Surgical History:  Procedure Laterality Date   COLONOSCOPY N/A 11/05/2014   Procedure:  COLONOSCOPY;  Surgeon: Scot Jun, MD;  Location: [facility] ENDOSCOPY;  Service: Endoscopy;  Laterality: N/A;   COLONOSCOPY  11/2016   Dr. Mechele Collin   CYSTOSCOPY  2007   with biopsy    CYSTOSCOPY N/A 06/01/2016   Procedure: CYSTOSCOPY;  Surgeon: Vena Austria, MD;  Location: [facility] ORS;  Service: Gynecology;  Laterality: N/A;   DIAGNOSTIC LAPAROSCOPY     DILATATION & CURETTAGE/HYSTEROSCOPY WITH MYOSURE N/A 04/13/2015   Procedure: DILATATION & CURETTAGE/HYSTEROSCOPY WITH MYOSURE/POLYPECTOMY;  Surgeon: Elenora Fender Ward, MD;  Location: [facility] ORS;  Service: Gynecology;  Laterality: N/A;   DILATATION & CURETTAGE/HYSTEROSCOPY WITH MYOSURE N/A 06/01/2016   Procedure: DILATATION & CURETTAGE/HYSTEROSCOPY WITH MYOSURE;  Surgeon: Vena Austria, MD;  Location: [facility] ORS;  Service: Gynecology;  Laterality: N/A;   DILATION AND CURETTAGE OF UTERUS     ESOPHAGOGASTRODUODENOSCOPY (EGD) WITH PROPOFOL N/A 08/31/2022   Procedure: ESOPHAGOGASTRODUODENOSCOPY (EGD) WITH PROPOFOL;  Surgeon: Jaynie Collins, DO;  Location[facility] ENDOSCOPY;  Service: Gastroenterology;  Laterality: N/A;   GUM SURGERY     laproscopy  2007   POLYPECTOMY     endometrial    Home Medications:  Allergies as of 09/26/2022       Reactions   Milk (cow) Other (See Comments)   Milk-related Compounds    Omeprazole Other (See Comments)   Pt reports "Chest pain, HA  and Dysuria." 01/05/22 patient states she is able to take omeprazole now without a reaction   Penicillins Itching, Rash        Medication List        Accurate as of September 26, 2022 12:01 PM. If you have any questions, ask your nurse or doctor.          CALCIUM 1200 PO Take 2-3 tablets by mouth daily. Reported on 05/26/2015   cholecalciferol 10 MCG (400 UNIT) Tabs tablet Commonly known as: VITAMIN D3 Take 400 Units by mouth daily.   MULTIVITAMIN ADULT PO Take 1 tablet by mouth daily.   pantoprazole 40 MG tablet Commonly known as: PROTONIX Take 1 tablet (40 mg  total) by mouth 2 (two) times daily before a meal.   polyethylene glycol 17 g packet Commonly known as: MIRALAX / GLYCOLAX Take 17 g by mouth daily.   PROBIOTIC DAILY PO Take 1 tablet by mouth daily.   psyllium 0.52 g capsule Commonly known as: REGULOID Take 0.52 g by mouth daily.   ZINC OXIDE PO Take 1 tablet by mouth daily.        Allergies:  Allergies  Allergen Reactions   Milk (Cow) Other (See Comments)   Milk-Related Compounds    Omeprazole Other (See Comments)    Pt reports "Chest pain, HA and Dysuria." 01/05/22 patient states she is able to take omeprazole now without a reaction   Penicillins Itching and Rash    Family History: Family History  Problem Relation Age of Onset   Arthritis Mother    Heart disease Mother    Stroke Mother        TIA   Hypertension Mother    Diabetes Mother    Kidney cancer Mother 2   Heart failure Mother    Hyperlipidemia Mother    Colon cancer Father 60   Arthritis Father    Hyperlipidemia Father    Transient ischemic attack Father    Cancer - Colon Father 83   Prostate cancer Father 32   Valvular heart disease Father    Cancer Maternal Grandmother    Cancer Paternal Grandfather        prostate   Breast cancer Cousin 42       has contact    Social History:   reports that she has never smoked. She has never been exposed to tobacco smoke. She has never used smokeless tobacco. She reports that she does not drink alcohol and does not use drugs.  Physical Exam: BP 110/63   Pulse 70   Ht 5\' 10"  (1.778 m)   Wt 123 lb (55.8 kg)   LMP 11/03/2021 (Exact Date)   BMI 17.65 kg/m   Constitutional:  Alert and oriented, no acute distress, nontoxic appearing HEENT: [place], AT Cardiovascular: No clubbing, cyanosis, or edema Respiratory: Normal respiratory effort, no increased work of breathing Skin: No rashes, bruises or suspicious lesions Neurologic: Grossly intact, no focal deficits, moving all 4 extremities Psychiatric: Normal  mood and affect  Laboratory Data: Results for orders placed or performed in visit on 09/26/22  Microscopic Examination   Urine  Result Value Ref Range   WBC, UA 0-5 0 - 5 /hpf   RBC, Urine 0-2 0 - 2 /hpf   Epithelial Cells (non renal) 0-10 0 - 10 /hpf   Mucus, UA Present (A) Not Estab.   Bacteria, UA Few None seen/Few  Urinalysis, Complete  Result Value Ref Range   Specific Gravity, UA 1.020 1.005 - 1.030  pH, UA 5.5 5.0 - 7.5   Color, UA Yellow Yellow   Appearance Ur Clear Clear   Leukocytes,UA Negative Negative   Protein,UA Negative Negative/Trace   Glucose, UA Negative Negative   Ketones, UA Negative Negative   RBC, UA Negative Negative   Bilirubin, UA Negative Negative   Urobilinogen, Ur 0.2 0.2 - 1.0 mg/dL   Nitrite, UA Negative Negative   Microscopic Examination See below:    Assessment & Plan:   1. Nephrolithiasis We discussed that renal ultrasound frequently overestimates stone size.  PET scan yesterday shows only 1 tiny left lower pole stone and possible punctate right renal stones, however overall these are very small and nonobstructing and I do not recommend treatment.  We discussed that the stones may never cause her problem and they do appear rather stable compared to CT dated 10/04/2020.  UA today remains bland.  I am not sending for culture.  We discussed that in the absence of WBCs in the urine, there is no urinary infection. - Urinalysis, Complete  2. Neurogenic bladder We discussed that she does not need to help her catheters when she performs CIC.  I do think that her CIC technique could be contributing to her frequent UTI symptoms.  Return if symptoms worsen or fail to improve.  Carman Ching, PA-C  [facility] [place] [address], [address] [place], [place] [address] [phone]

## 2022-09-27 ENCOUNTER — Ambulatory Visit: Payer: BC Managed Care – PPO | Admitting: Oncology

## 2022-10-02 ENCOUNTER — Inpatient Hospital Stay: Payer: BC Managed Care – PPO | Attending: Oncology | Admitting: Oncology

## 2022-10-02 ENCOUNTER — Encounter: Payer: Self-pay | Admitting: Oncology

## 2022-10-02 VITALS — BP 118/69 | HR 63 | Temp 97.1°F | Resp 16 | Ht 70.0 in | Wt 125.6 lb

## 2022-10-02 DIAGNOSIS — Z8042 Family history of malignant neoplasm of prostate: Secondary | ICD-10-CM | POA: Diagnosis not present

## 2022-10-02 DIAGNOSIS — R590 Localized enlarged lymph nodes: Secondary | ICD-10-CM | POA: Diagnosis present

## 2022-10-02 DIAGNOSIS — Z809 Family history of malignant neoplasm, unspecified: Secondary | ICD-10-CM | POA: Diagnosis not present

## 2022-10-02 DIAGNOSIS — K6389 Other specified diseases of intestine: Secondary | ICD-10-CM | POA: Diagnosis present

## 2022-10-02 DIAGNOSIS — Z803 Family history of malignant neoplasm of breast: Secondary | ICD-10-CM | POA: Insufficient documentation

## 2022-10-02 DIAGNOSIS — Z800 Family history of malignant neoplasm of digestive organs: Secondary | ICD-10-CM | POA: Diagnosis not present

## 2022-10-02 DIAGNOSIS — Z8051 Family history of malignant neoplasm of kidney: Secondary | ICD-10-CM | POA: Insufficient documentation

## 2022-10-02 NOTE — Assessment & Plan Note (Signed)
Nonspecific finding of single enlargement of subcarinal lymph node, 1.4 x 3.1 cm.  No primary lung lesion.  Non-smoker PET scan showed resolution of subcarinal lymph node, likely reactive.  No need for additional work up

## 2022-10-02 NOTE — Assessment & Plan Note (Signed)
She is interested in genetic testing.  I will refer her to genetic counselor. 

## 2022-10-02 NOTE — Assessment & Plan Note (Signed)
?    Stool versus renal mass.  PET scan showed resolution of cecum mass.

## 2022-10-02 NOTE — Progress Notes (Signed)
Hematology/Oncology Progress note [phone] [phone]           REFERRING PROVIDER: [name], FNP   CHIEF COMPLAINTS/REASON FOR VISIT:  abnormal CT scan, lymphadenopathy.  Cecal mass.   ASSESSMENT & PLAN:   Thoracic lymphadenopathy Nonspecific finding of single enlargement of subcarinal lymph node, 1.4 x 3.1 cm.  No primary lung lesion.  Non-smoker PET scan showed resolution of subcarinal lymph node, likely reactive.  No need for additional work up  Family history of cancer She is interested in genetic testing.  I will refer her to genetic counselor.  Cecum mass ?  Stool versus renal mass.  PET scan showed resolution of cecum mass.   Patient is discharged from my clinic. I recommend patient to continue follow up with primary care physician. Patient may re-establish care in the future if clinically indicated.  All questions were answered. The patient knows to call the clinic with any problems, questions or concerns.  Rickard Patience, MD, PhD [facility] Hematology Oncology 10/02/2022   HISTORY OF PRESENTING ILLNESS:   Cristina Wade is a  52 y.o.  female with PMH listed below was seen in consultation at the request of  [name], FNP  for evaluation of abnormal CT scan  08/30/2022-08/31/2022 patient was hospitalized due to upper GI bleeding after taking NSAIDs for ankle pain. She initially presented with chest pain and hematemesis.  Inpatient EGD showed NSAIDs induced gastritis.  Patient was treated with PPI. 08/31/2022 CT chest abdomen pelvis showed prominent nonspecific subcarinal lymph node measuring 1.4 cm x 3.1 cm.  Emphysema 4.7 cm mass of either nonaerated stool or a true mucosal mass in the cecum. Nonobstructive bilateral micronephrolithiasis. Constipation and diverticulosis.  Patient was referred to oncology for further evaluation.  She was accompanied by her sister. Denies any history of smoking. Denies weight loss, fever, chills,  fatigue, night sweats.  Denies shortness of breath, hemoptysis. Family history positive for breast cancer, colon cancer, kidney cancer, prostate cancer  INTERVAL HISTORY Cristina Wade is a 52 y.o. female who has above history reviewed by me today presents for follow up visit for abnormal CT. Patient has had a PET scan done and she presents to discuss results and management plan.  No new complaints.   MEDICAL HISTORY:  Past Medical History:  Diagnosis Date   Anxiety    Asthma    Chronic pelvic pain in female 2018   Cystitis    Cystocele    Endometrial polyp    Endometriosis    per pt report but never seen during surg   Frequent headaches    Gastritis    GERD (gastroesophageal reflux disease)    rare   Gross hematuria    History of kidney stones    Microscopic hematuria    Migraine    Migraines    Neuropathy    Osteopenia    Urinary disorder    UTI (lower urinary tract infection)     SURGICAL HISTORY: Past Surgical History:  Procedure Laterality Date   COLONOSCOPY N/A 11/05/2014   Procedure: COLONOSCOPY;  Surgeon: Scot Jun, MD;  Location: [facility] ENDOSCOPY;  Service: Endoscopy;  Laterality: N/A;   COLONOSCOPY  11/2016   Dr. Mechele Collin   CYSTOSCOPY  2007   with biopsy    CYSTOSCOPY N/A 06/01/2016   Procedure: CYSTOSCOPY;  Surgeon: Vena Austria, MD;  Location: [facility] ORS;  Service: Gynecology;  Laterality: N/A;   DIAGNOSTIC LAPAROSCOPY     DILATATION & CURETTAGE/HYSTEROSCOPY WITH MYOSURE  N/A 04/13/2015   Procedure: DILATATION & CURETTAGE/HYSTEROSCOPY WITH MYOSURE/POLYPECTOMY;  Surgeon: Elenora Fender Ward, MD;  Location: [facility] ORS;  Service: Gynecology;  Laterality: N/A;   DILATATION & CURETTAGE/HYSTEROSCOPY WITH MYOSURE N/A 06/01/2016   Procedure: DILATATION & CURETTAGE/HYSTEROSCOPY WITH MYOSURE;  Surgeon: Vena Austria, MD;  Location: [facility] ORS;  Service: Gynecology;  Laterality: N/A;   DILATION AND CURETTAGE OF UTERUS     ESOPHAGOGASTRODUODENOSCOPY (EGD) WITH PROPOFOL  N/A 08/31/2022   Procedure: ESOPHAGOGASTRODUODENOSCOPY (EGD) WITH PROPOFOL;  Surgeon: Jaynie Collins, DO;  Location: [facility] ENDOSCOPY;  Service: Gastroenterology;  Laterality: N/A;   GUM SURGERY     laproscopy  2007   POLYPECTOMY     endometrial    SOCIAL HISTORY: Social History   Socioeconomic History   Marital status: Single    Spouse name: Not on file   Number of children: Not on file   Years of education: Not on file   Highest education level: Not on file  Occupational History   Not on file  Tobacco Use   Smoking status: Never    Passive exposure: Never   Smokeless tobacco: Never  Vaping Use   Vaping Use: Never used  Substance and Sexual Activity   Alcohol use: No   Drug use: No   Sexual activity: Not on file  Other Topics Concern   Not on file  Social History Narrative   Lives with twin sister   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    Social Determinants of Health   Financial Resource Strain: Low Risk  (09/12/2022)   Overall Financial Resource Strain (CARDIA)    Difficulty of Paying Living Expenses: Not very hard  Food Insecurity: No Food Insecurity (09/12/2022)   Hunger Vital Sign    Worried About Running Out of Food in the Last Year: Never true    Ran Out of Food in the Last Year: Never true  Transportation Needs: No Transportation Needs (09/12/2022)   PRAPARE - Administrator, Civil Service (Medical): No    Lack of Transportation (Non-Medical): No  Physical Activity: Not on file  Stress: No Stress Concern Present (09/12/2022)   [employer] of Occupational Health - Occupational Stress Questionnaire    Feeling of Stress : Not at all  Social Connections: Not on file  Intimate Partner Violence: Not At Risk (09/12/2022)   Humiliation, Afraid, Rape, and Kick questionnaire    Fear of  Current or Ex-Partner: No    Emotionally Abused: No    Physically Abused: No    Sexually Abused: No    FAMILY HISTORY: Family History  Problem Relation Age of Onset   Arthritis Mother    Heart disease Mother    Stroke Mother        TIA   Hypertension Mother    Diabetes Mother    Kidney cancer Mother 58   Heart failure Mother    Hyperlipidemia Mother    Colon cancer Father 38   Arthritis Father    Hyperlipidemia Father    Transient ischemic attack Father    Cancer - Colon Father 35   Prostate cancer Father 54   Valvular heart disease Father    Cancer Maternal Grandmother    Cancer Paternal Grandfather        prostate   Breast  cancer Cousin 35       has contact    ALLERGIES:  is allergic to milk (cow), milk-related compounds, omeprazole, and penicillins.  MEDICATIONS:  Current Outpatient Medications  Medication Sig Dispense Refill   Calcium Carbonate-Vit D-Min (CALCIUM 1200 PO) Take 2-3 tablets by mouth daily. Reported on 05/26/2015     cholecalciferol (VITAMIN D3) 10 MCG (400 UNIT) TABS tablet Take 400 Units by mouth daily.     Multiple Vitamin (MULTIVITAMIN ADULT PO) Take 1 tablet by mouth daily.     pantoprazole (PROTONIX) 40 MG tablet Take 1 tablet (40 mg total) by mouth 2 (two) times daily before a meal. 60 tablet 1   polyethylene glycol (MIRALAX / GLYCOLAX) 17 g packet Take 17 g by mouth daily.     Probiotic Product (PROBIOTIC DAILY PO) Take 1 tablet by mouth daily.     psyllium (REGULOID) 0.52 g capsule Take 0.52 g by mouth daily.     ZINC OXIDE PO Take 1 tablet by mouth daily.     Current Facility-Administered Medications  Medication Dose Route Frequency Provider Last Rate Last Admin   lidocaine (XYLOCAINE) 2 % jelly 1 application  1 application  Urethral Once Sondra Come, MD        Review of Systems  Constitutional:  Negative for appetite change, chills, fatigue and fever.  HENT:   Negative for hearing loss and voice change.   Eyes:  Negative for eye  problems.  Respiratory:  Negative for chest tightness and cough.   Cardiovascular:  Negative for chest pain.  Gastrointestinal:  Negative for abdominal distention, abdominal pain and blood in stool.  Endocrine: Negative for hot flashes.  Genitourinary:  Negative for difficulty urinating and frequency.   Musculoskeletal:  Negative for arthralgias.  Skin:  Negative for itching and rash.  Neurological:  Negative for extremity weakness.  Hematological:  Negative for adenopathy.  Psychiatric/Behavioral:  Negative for confusion.    PHYSICAL EXAMINATION: ECOG PERFORMANCE STATUS: 0 - Asymptomatic Vitals:   10/02/22 1419  BP: 118/69  Pulse: 63  Resp: 16  Temp: (!) 97.1 F (36.2 C)  SpO2: 100%   Filed Weights   10/02/22 1419  Weight: 125 lb 9.6 oz (57 kg)    Physical Exam Constitutional:      General: She is not in acute distress. HENT:     Head: Normocephalic and atraumatic.  Eyes:     General: No scleral icterus. Cardiovascular:     Rate and Rhythm: Normal rate and regular rhythm.     Heart sounds: Normal heart sounds.  Pulmonary:     Effort: Pulmonary effort is normal. No respiratory distress.     Breath sounds: No wheezing.  Abdominal:     General: Bowel sounds are normal. There is no distension.     Palpations: Abdomen is soft.  Musculoskeletal:        General: No deformity. Normal range of motion.     Cervical back: Normal range of motion and neck supple.  Skin:    General: Skin is warm and dry.     Findings: No erythema or rash.  Neurological:     Mental Status: She is alert and oriented to person, place, and time. Mental status is at baseline.     Cranial Nerves: No cranial nerve deficit.     Coordination: Coordination normal.     LABORATORY DATA:  I have reviewed the data as listed    Latest Ref Rng & Units 08/31/2022  6:30 AM 08/30/2022    8:53 PM 01/14/2022   11:37 PM  CBC  WBC 4.0 - 10.5 K/uL 4.9  7.9  6.8   Hemoglobin 12.0 - 15.0 g/dL 16.1  09.6   04.5   Hematocrit 36.0 - 46.0 % 37.1  39.7  42.9   Platelets 150 - 400 K/uL 180  196  207       Latest Ref Rng & Units 09/06/2022   12:12 PM 08/31/2022    6:30 AM 08/30/2022    8:53 PM  CMP  Glucose 70 - 99 mg/dL 91  96  409   BUN 6 - 23 mg/dL 16  14  14    Creatinine 0.40 - 1.20 mg/dL 8.11  9.14  7.82   Sodium 135 - 145 mEq/L 140  139  137   Potassium 3.5 - 5.1 mEq/L 4.7  3.2  3.7   Chloride 96 - 112 mEq/L 103  107  102   CO2 19 - 32 mEq/L 30  25  26    Calcium 8.4 - 10.5 mg/dL 9.6  8.8  9.2   Total Protein 6.5 - 8.1 g/dL   6.7   Total Bilirubin 0.3 - 1.2 mg/dL   0.5   Alkaline Phos 38 - 126 U/L   55   AST 15 - 41 U/L   23   ALT 0 - 44 U/L   21       RADIOGRAPHIC STUDIES: I have personally reviewed the radiological images as listed and agreed with the findings in the report. Abdomen 1 view (KUB)  Result Date: 10/01/2022 CLINICAL DATA:  Kidney stone. EXAM: ABDOMEN - 1 VIEW COMPARISON:  03/24/2022 FINDINGS: The bowel gas pattern is normal. No radio-opaque calculi or other significant radiographic abnormality are seen. IMPRESSION: Negative. Electronically Signed   By: Layla Maw M.D.   On: 10/01/2022 17:54   NM PET Image Initial (PI) Skull Base To Thigh  Result Date: 09/30/2022 CLINICAL DATA:  Initial treatment strategy for thoracic lymphadenopathy. EXAM: NUCLEAR MEDICINE PET SKULL BASE TO THIGH TECHNIQUE: 6.9 mCi F-18 FDG was injected intravenously. Full-ring PET imaging was performed from the skull base to thigh after the radiotracer. CT data was obtained and used for attenuation correction and anatomic localization. Fasting blood glucose: 71 mg/dl COMPARISON:  CTA chest abdomen pelvis dated 08/31/2022 FINDINGS: Mediastinal blood pool activity: SUV max 1.9 Liver activity: SUV max NA NECK: No hypermetabolic cervical lymphadenopathy. Incidental CT findings: None. CHEST: No hypermetabolic thoracic lymphadenopathy. Specifically, no suspicious subcarinal node. No hypermetabolic  pulmonary nodules. Incidental CT findings: None. ABDOMEN/PELVIS: No abnormal hypermetabolism in the liver, spleen, pancreas, or adrenal glands. No hypermetabolic abdominopelvic lymphadenopathy. No focal hypermetabolism in the ascending colon/cecum. Prior soft tissue abnormality has resolved, likely reflecting fluid/liquid stool from the terminal ileum. Incidental CT findings: None. SKELETON: No focal hypermetabolic activity to suggest skeletal metastasis. Incidental CT findings: None. IMPRESSION: No findings suspicious for malignancy. Specifically, the prior subcarinal node has resolved. Additionally, there is no residual soft tissue lesion along the cecum. Electronically Signed   By: Charline Bills M.D.   On: 09/30/2022 02:27   US Renal  Result Date: 09/14/2022 CLINICAL DATA:  Follow-up renal cyst identified on CT imaging. EXAM: RENAL / URINARY TRACT ULTRASOUND COMPLETE COMPARISON:  CT angiogram of the abdomen and pelvis Aug 31, 2022 FINDINGS: Right Kidney: Renal measurements: 11.8 x 4.2 x 4.4 cm = volume: 113 mL. Contains 2 probable stones with the largest measuring 7.3 mm. Left Kidney: Renal measurements: 11.5 x  5.4 x 5.5 cm = volume: 179 mL. Contains 3 probable stones with the largest measuring 5.8 mm. Bladder: Appears normal for degree of bladder distention. Other: None. IMPRESSION: 1. The cyst identified on the recent CT is not identified on today's study. 2. Nonobstructive bilateral renal stones as above. Electronically Signed   By: Gerome Sam III M.D.   On: 09/14/2022 08:06   CT HEAD WO CONTRAST ( )  Result Date: 08/31/2022 CLINICAL DATA:  Headache, neurologic deficit. EXAM: CT HEAD WITHOUT CONTRAST TECHNIQUE: Contiguous axial images were obtained from the base of the skull through the vertex without intravenous contrast. RADIATION DOSE REDUCTION: This exam was performed according to the departmental dose-optimization program which includes automated exposure control, adjustment of the mA  and/or kV according to patient size and/or use of iterative reconstruction technique. COMPARISON:  Head CT 01/20/2021 FINDINGS: Brain: No evidence of acute infarction, hemorrhage, hydrocephalus, extra-axial collection or mass lesion/mass effect. There is mild cerebral cortical atrophy, without findings of significant small-vessel disease. Slight cerebellar atrophy. Basal cisterns are clear. Vascular: No hyperdense vessel or unexpected calcification. Skull: Negative for fractures or focal lesions. Sinuses/Orbits: Negative visualized orbits, visualized sinuses and mastoid air cells. Midline nasal septum. Other: None. IMPRESSION: 1. No acute intracranial CT findings. 2. Mild cerebral and slight cerebellar atrophy. Electronically Signed   By: Almira Bar M.D.   On: 08/31/2022 01:33   CT Angio Chest/Abd/Pel for Dissection W and/or Wo Contrast  Result Date: 08/31/2022 CLINICAL DATA:  Chest pain and hematemesis. Acute aortic syndrome suspected. EXAM: CT ANGIOGRAPHY CHEST, ABDOMEN AND PELVIS TECHNIQUE: Non-contrast CT of the chest was initially obtained. Multidetector CT imaging through the chest, abdomen and pelvis was performed using the standard protocol during bolus administration of intravenous contrast. Multiplanar reconstructed images and MIPs were obtained and reviewed to evaluate the vascular anatomy. RADIATION DOSE REDUCTION: This exam was performed according to the departmental dose-optimization program which includes automated exposure control, adjustment of the mA and/or kV according to patient size and/or use of iterative reconstruction technique. CONTRAST:  OMNIPAQUE IOHEXOL 350 MG/ML SOLN COMPARISON:  PA and lateral chest today, PA and lateral chest 01/14/2022, CT abdomen and pelvis no contrast 10/04/2020, CT abdomen and pelvis with contrast 10/26/2019 FINDINGS: CTA CHEST FINDINGS Cardiovascular: The cardiac size is normal. There is no pericardial effusion. There are no visible coronary artery  calcifications. The aorta and great vessels are normal. There are no visible plaques. The pulmonary arteries and veins are normal in caliber. Pulmonary arteries are centrally clear on this nondedicated study. Mediastinum/Nodes: There is a single prominent nonspecific subcarinal lymph node measuring 1.4 x 3.1 cm on 5:83. No other intrathoracic adenopathy is seen. Axillary spaces are free of adenopathy. The lower poles of the thyroid gland are unremarkable. The thoracic trachea, both of the main bronchi and the thoracic esophagus are unremarkable. Lungs/Pleura: The lungs are mildly emphysematous with centrilobular changes predominating in the upper lobes. There are symmetric bilateral apical pleuroparenchymal scar-like opacities. There are no nodules or infiltrates. There is no pleural effusion, thickening or pneumothorax. Musculoskeletal: No chest wall abnormality. No acute or significant osseous findings. Review of the MIP images confirms the above findings. CTA ABDOMEN AND PELVIS FINDINGS VASCULAR Aorta: Normal. Celiac: Normal. SMA: Normal. Renals: Both are single.  Both are normal.  No branch occlusion. IMA: Normal. Inflow and proximal outflow: Patent without evidence of aneurysm, dissection, vasculitis or significant stenosis. Veins: No obvious venous abnormality within the limitations of this arterial phase study. Review of the MIP images  confirms the above findings. 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 No other focal abnormality of the liver is seen. The gallbladder and bile ducts are unremarkable. Pancreas: No abnormality. Spleen: No abnormality.  No splenomegaly. Adrenals/Urinary Tract: No adrenal mass. Again noted a 7 mm hypodensity in the superior pole right renal cortex which is too small to characterize, but most likely represents a cyst. No follow-up imaging is recommended. For reference see JACR 2018 Feb; 264-273,  Management of the Incidental Renal Mass on CT, [employer] 2021; [id number], Bosniak Classification of Cystic Renal Masses, Version 2019. Both kidneys are otherwise unremarkable. There are scattered punctate nonobstructive caliceal stones in the inferior pole of both kidneys but no obstructing stones or hydronephrosis. There is no bladder thickening. Stomach/Bowel: The stomach is unremarkable. The small bowel is normal caliber. There are thickened folds in the left abdominal jejunal segments consistent with nonspecific enteritis. Remainder of the small bowel is unremarkable. The appendix is normal. There is a 4.7 cm mass of either nonaerated stool or a true mucosal mass in the cecum. Further evaluation is recommended. There is mild fecal stasis. Colonic diverticulosis noted without evidence of diverticulitis. Lymphatic: No abdominal or pelvic adenopathy is seen. Reproductive: Uterus and bilateral adnexa are unremarkable. Other: Multiple pelvic phleboliths. No free air, free fluid, free hemorrhage or incarcerated lymph nodes. Musculoskeletal: No acute or significant osseous findings. Mild hip DJD. Review of the MIP images confirms the above findings. IMPRESSION: 1. Normal aorta and branch vessels. 2. Single prominent nonspecific subcarinal lymph node measuring 1.4 x 3.1 cm. No other adenopathy is seen. 3. Emphysema. 4. 4.7 cm mass of either nonaerated stool or a true mucosal mass in the cecum. Further evaluation is recommended. 5. Nonobstructive bilateral micronephrolithiasis. 6. Constipation and diverticulosis. Emphysema (ICD10-J43.9). Electronically Signed   By: Almira Bar M.D.   On: 08/31/2022 01:28   DG Chest 2 View  Result Date: 08/30/2022 CLINICAL DATA:  Chest pain EXAM: CHEST - 2 VIEW COMPARISON:  01/14/2022 FINDINGS: Hyperinflation. Heart and mediastinal contours are within normal limits. No focal opacities or effusions. No acute bony abnormality. IMPRESSION: Hyperinflation. No active cardiopulmonary  disease. Electronically Signed   By: Charlett Nose M.D.   On: 08/30/2022 21:15   US RENAL  Result Date: 08/29/2022 CLINICAL DATA:  Recurrent UTIs.  History of stones. EXAM: RENAL / URINARY TRACT ULTRASOUND COMPLETE COMPARISON:  Renal ultrasound, 11/08/2021.  CT, 10/04/2020. FINDINGS: Right Kidney: Renal measurements: 11.8 x 3.8 x 5.9 cm = volume: 136 mL. Echogenicity within normal limits. No mass or hydronephrosis visualized. Left Kidney: Renal measurements: 11.7 x 5.8 x 5.2 cm = volume: 182 mL. Echogenicity within normal limits. No mass or hydronephrosis visualized. Bladder: Appears normal for degree of bladder distention. Other: None. IMPRESSION: 1. Normal renal ultrasound. No change from prior renal ultrasound. Tiny intrarenal stones noted on the previous CT are not visualized sonographically. Electronically Signed   By: Amie Portland M.D.   On: 08/29/2022 16:42   MM 3D SCREEN BREAST BILATERAL  Result Date: 07/17/2022 CLINICAL DATA:  Screening. EXAM: DIGITAL SCREENING BILATERAL MAMMOGRAM WITH TOMOSYNTHESIS AND CAD TECHNIQUE: Bilateral screening digital craniocaudal and mediolateral oblique mammograms were obtained. Bilateral screening digital breast tomosynthesis was performed. The images were evaluated with computer-aided detection. COMPARISON:  Previous exam(s). ACR Breast Density Category d: The breasts are extremely dense, which lowers the sensitivity of mammography. FINDINGS: There are no findings suspicious for malignancy. IMPRESSION: No mammographic evidence of  malignancy. A result letter of this screening mammogram will be mailed directly to the patient. RECOMMENDATION: Screening mammogram in one year. (Code:[id number]-B-01Y) BI-RADS CATEGORY  1: Negative. Electronically Signed   By: Baird Lyons M.D.   On: 07/17/2022 14:33

## 2022-10-04 ENCOUNTER — Inpatient Hospital Stay: Payer: BC Managed Care – PPO

## 2022-10-05 ENCOUNTER — Encounter: Payer: BC Managed Care – PPO | Admitting: Licensed Clinical Social Worker

## 2022-10-05 ENCOUNTER — Other Ambulatory Visit: Payer: BC Managed Care – PPO

## 2022-10-05 ENCOUNTER — Ambulatory Visit: Payer: BC Managed Care – PPO | Admitting: "Endocrinology

## 2022-10-11 ENCOUNTER — Encounter: Payer: Self-pay | Admitting: Physician Assistant

## 2022-10-11 ENCOUNTER — Ambulatory Visit: Payer: BC Managed Care – PPO | Admitting: Oncology

## 2022-10-11 ENCOUNTER — Other Ambulatory Visit: Payer: BC Managed Care – PPO

## 2022-10-11 ENCOUNTER — Encounter: Payer: BC Managed Care – PPO | Admitting: Licensed Clinical Social Worker

## 2022-10-11 ENCOUNTER — Ambulatory Visit (INDEPENDENT_AMBULATORY_CARE_PROVIDER_SITE_OTHER): Payer: BC Managed Care – PPO | Admitting: Physician Assistant

## 2022-10-11 VITALS — BP 102/65 | HR 69 | Ht 70.0 in | Wt 121.5 lb

## 2022-10-11 DIAGNOSIS — R3989 Other symptoms and signs involving the genitourinary system: Secondary | ICD-10-CM

## 2022-10-11 DIAGNOSIS — N390 Urinary tract infection, site not specified: Secondary | ICD-10-CM

## 2022-10-11 DIAGNOSIS — R82998 Other abnormal findings in urine: Secondary | ICD-10-CM

## 2022-10-11 DIAGNOSIS — N200 Calculus of kidney: Secondary | ICD-10-CM

## 2022-10-11 LAB — URINALYSIS, COMPLETE
Bilirubin, UA: NEGATIVE
Glucose, UA: NEGATIVE
Ketones, UA: NEGATIVE
Nitrite, UA: POSITIVE — AB
Specific Gravity, UA: 1.01 (ref 1.005–1.030)
Urobilinogen, Ur: 0.2 mg/dL (ref 0.2–1.0)
pH, UA: 7 (ref 5.0–7.5)

## 2022-10-11 LAB — MICROSCOPIC EXAMINATION: WBC, UA: >30 /hpf — AB (ref 0–5)

## 2022-10-11 MED ORDER — SULFAMETHOXAZOLE-TRIMETHOPRIM 800-160 MG PO TABS
1.0000 | ORAL_TABLET | Freq: Two times a day (BID) | ORAL | 0 refills | Status: AC
Start: 2022-10-11 — End: 2022-10-18

## 2022-10-11 NOTE — Progress Notes (Signed)
10/11/2022 10:59 AM   Cristina Wade [birth year] [id number]  CC: No chief complaint on file.  HPI: Cristina Wade is a 52 y.o. female with PMH high risk hematuria with benign workup in 2020 05/2021, neurogenic bladder managed with CIC 3 times daily, nephrolithiasis, and recurrent UTI versus colonization who presents today for evaluation of possible UTI.   Today she reports an approximate 5-day history of bladder pain and malodorous urine.  She has not taken any medication for this.  She also reports concerns regarding renal ultrasound findings of bilateral renal stones measuring up to 7.3 mm on the right and 5.8 mm on the left.  I saw her in clinic 15 days ago regarding these concerns and we discussed that these are likely overestimates of stone size and that her follow-up PET scan showed only 1 small, <73mm left lower pole stone and punctate right renal stones that do not require treatment.  In-office UA today positive for 1+ blood, 1+ protein, nitrites, and 3+ leukocytes; urine microscopy with >30 WBCs/HPF, 3-10 RBCs/HPF, and many bacteria.   PMH: Past Medical History:  Diagnosis Date   Anxiety    Asthma    Chronic pelvic pain in female 2018   Cystitis    Cystocele    Endometrial polyp    Endometriosis    per pt report but never seen during surg   Frequent headaches    Gastritis    GERD (gastroesophageal reflux disease)    rare   Gross hematuria    History of kidney stones    Microscopic hematuria    Migraine    Migraines    Neuropathy    Osteopenia    Urinary disorder    UTI (lower urinary tract infection)     Surgical History: Past Surgical History:  Procedure Laterality Date   COLONOSCOPY N/A 11/05/2014   Procedure: COLONOSCOPY;  Surgeon: Scot Jun, MD;  Location: [facility] ENDOSCOPY;  Service: Endoscopy;  Laterality: N/A;   COLONOSCOPY  11/2016   Dr. Mechele Collin   CYSTOSCOPY  2007   with biopsy    CYSTOSCOPY N/A 06/01/2016   Procedure: CYSTOSCOPY;  Surgeon: Vena Austria, MD;  Location: [facility] ORS;  Service: Gynecology;  Laterality: N/A;   DIAGNOSTIC LAPAROSCOPY     DILATATION & CURETTAGE/HYSTEROSCOPY WITH MYOSURE N/A 04/13/2015   Procedure: DILATATION & CURETTAGE/HYSTEROSCOPY WITH MYOSURE/POLYPECTOMY;  Surgeon: Elenora Fender Ward, MD;  Location: [facility] ORS;  Service: Gynecology;  Laterality: N/A;   DILATATION & CURETTAGE/HYSTEROSCOPY WITH MYOSURE N/A 06/01/2016   Procedure: DILATATION & CURETTAGE/HYSTEROSCOPY WITH MYOSURE;  Surgeon: Vena Austria, MD;  Location: [facility] ORS;  Service: Gynecology;  Laterality: N/A;   DILATION AND CURETTAGE OF UTERUS     ESOPHAGOGASTRODUODENOSCOPY (EGD) WITH PROPOFOL N/A 08/31/2022   Procedure: ESOPHAGOGASTRODUODENOSCOPY (EGD) WITH PROPOFOL;  Surgeon: Jaynie Collins, DO;  Location: [facility] ENDOSCOPY;  Service: Gastroenterology;  Laterality: N/A;   GUM SURGERY     laproscopy  2007   POLYPECTOMY     endometrial    Home Medications:  Allergies as of 10/11/2022       Reactions   Milk (cow) Other (See Comments)   Milk-related Compounds    Omeprazole Other (See Comments)   Pt reports "Chest pain, HA and Dysuria." 01/05/22 patient states she is able to take omeprazole now without a reaction   Penicillins Itching, Rash        Medication List        Accurate as of October 11, 2022 10:59 AM. If you have any  questions, ask your nurse or doctor.          CALCIUM 1200 PO Take 2-3 tablets by mouth daily. Reported on 05/26/2015   cholecalciferol 10 MCG (400 UNIT) Tabs tablet Commonly known as: VITAMIN D3 Take 400 Units by mouth daily.   MULTIVITAMIN ADULT PO Take 1 tablet by mouth daily.   pantoprazole 40 MG tablet Commonly known as: PROTONIX Take 1 tablet (40 mg total) by mouth 2 (two) times daily before a meal.   polyethylene glycol 17 g packet Commonly known as: MIRALAX / GLYCOLAX Take 17 g by mouth daily.   PROBIOTIC DAILY PO Take 1 tablet by mouth daily.   psyllium 0.52 g capsule Commonly known as:  REGULOID Take 0.52 g by mouth daily.   sulfamethoxazole-trimethoprim 800-160 MG tablet Commonly known as: BACTRIM DS Take 1 tablet by mouth 2 (two) times daily for 7 days. Started by: Carman Ching, PA-C   ZINC OXIDE PO Take 1 tablet by mouth daily.        Allergies:  Allergies  Allergen Reactions   Milk (Cow) Other (See Comments)   Milk-Related Compounds    Omeprazole Other (See Comments)    Pt reports "Chest pain, HA and Dysuria." 01/05/22 patient states she is able to take omeprazole now without a reaction   Penicillins Itching and Rash    Family History: Family History  Problem Relation Age of Onset   Arthritis Mother    Heart disease Mother    Stroke Mother        TIA   Hypertension Mother    Diabetes Mother    Kidney cancer Mother 55   Heart failure Mother    Hyperlipidemia Mother    Colon cancer Father 25   Arthritis Father    Hyperlipidemia Father    Transient ischemic attack Father    Cancer - Colon Father 16   Prostate cancer Father 59   Valvular heart disease Father    Cancer Maternal Grandmother    Cancer Paternal Grandfather        prostate   Breast cancer Cousin 51       has contact    Social History:   reports that she has never smoked. She has never been exposed to tobacco smoke. She has never used smokeless tobacco. She reports that she does not drink alcohol and does not use drugs.  Physical Exam: BP 102/65   Pulse 69   Ht 5\' 10"  (1.778 m)   Wt 121 lb 8 oz (55.1 kg)   LMP 11/03/2021 (Exact Date)   BMI 17.43 kg/m   Constitutional:  Alert and oriented, no acute distress, nontoxic appearing HEENT: [place], AT Cardiovascular: No clubbing, cyanosis, or edema Respiratory: Normal respiratory effort, no increased work of breathing Skin: No rashes, bruises or suspicious lesions Neurologic: Grossly intact, no focal deficits, moving all 4 extremities Psychiatric: Normal mood and affect  Laboratory Data: Results for orders placed or  performed in visit on 10/11/22  Microscopic Examination   Urine  Result Value Ref Range   WBC, UA >30 (A) 0 - 5 /hpf   RBC, Urine 3-10 (A) 0 - 2 /hpf   Epithelial Cells (non renal) 0-10 0 - 10 /hpf   Bacteria, UA Many (A) None seen/Few  Urinalysis, Complete  Result Value Ref Range   Specific Gravity, UA 1.010 1.005 - 1.030   pH, UA 7.0 5.0 - 7.5   Color, UA Yellow Yellow   Appearance Ur Cloudy (A) Clear  Leukocytes,UA 3+ (A) Negative   Protein,UA 1+ (A) Negative/Trace   Glucose, UA Negative Negative   Ketones, UA Negative Negative   RBC, UA 1+ (A) Negative   Bilirubin, UA Negative Negative   Urobilinogen, Ur 0.2 0.2 - 1.0 mg/dL   Nitrite, UA Positive (A) Negative   Microscopic Examination See below:    Assessment & Plan:   1. Recurrent UTI UA appears grossly infected and she reports bladder pain, though she does have a history of chronic pelvic discomfort and she is at high risk for colonization in the setting of CIC.  Will treat with empiric Bactrim and send for culture for further evaluation. - Urinalysis, Complete - CULTURE, URINE COMPREHENSIVE - sulfamethoxazole-trimethoprim (BACTRIM DS) 800-160 MG tablet; Take 1 tablet by mouth 2 (two) times daily for 7 days.  Dispense: 14 tablet; Refill: 0  2. Nephrolithiasis I reiterated my prior guidance, that the renal ultrasound was not an accurate measure of her kidney stones and a follow-up PET scan shows that these are much smaller in size and do not require treatment.  Return if symptoms worsen or fail to improve.  Carman Ching, PA-C  [facility] [place] [address], [address] [place], [place] [address] [phone]

## 2022-10-12 ENCOUNTER — Inpatient Hospital Stay: Payer: BC Managed Care – PPO

## 2022-10-12 ENCOUNTER — Inpatient Hospital Stay: Payer: BC Managed Care – PPO | Admitting: Licensed Clinical Social Worker

## 2022-10-12 DIAGNOSIS — Z8051 Family history of malignant neoplasm of kidney: Secondary | ICD-10-CM

## 2022-10-12 DIAGNOSIS — Z8042 Family history of malignant neoplasm of prostate: Secondary | ICD-10-CM

## 2022-10-12 DIAGNOSIS — Z803 Family history of malignant neoplasm of breast: Secondary | ICD-10-CM

## 2022-10-12 DIAGNOSIS — Z800 Family history of malignant neoplasm of digestive organs: Secondary | ICD-10-CM

## 2022-10-12 NOTE — Progress Notes (Signed)
REFERRING PROVIDER: Rickard Patience, MD [address] [place],  [place] [address]  PRIMARY PROVIDER:  [name], FNP  PRIMARY REASON FOR VISIT:  1. Family history of colon cancer   2. Family history of breast cancer   3. Family history of prostate cancer   4. Family history of kidney cancer      HISTORY OF PRESENT ILLNESS:   Cristina Wade, a 52 y.o. female, was seen for a [place] cancer genetics consultation at the request of Dr. Cathie Hoops due to a family history of cancer.  Cristina Wade presents to clinic today to discuss the possibility of a hereditary predisposition to cancer, genetic testing, and to further clarify her future cancer risks, as well as potential cancer risks for family members.   CANCER HISTORY:  Cristina Wade is a 52 y.o. female with no personal history of cancer.    RISK FACTORS:  Menarche was at age 61.  Ovaries intact: yes.  Hysterectomy: no. Menopausal status: postmenopausal.  Colonoscopy: yes Mammogram within the last year: yes. Number of breast biopsies: 0.  Past Medical History:  Diagnosis Date   Anxiety    Asthma    Chronic pelvic pain in female 2018   Cystitis    Cystocele    Endometrial polyp    Endometriosis    per pt report but never seen during surg   Frequent headaches    Gastritis    GERD (gastroesophageal reflux disease)    rare   Gross hematuria    History of kidney stones    Microscopic hematuria    Migraine    Migraines    Neuropathy    Osteopenia    Urinary disorder    UTI (lower urinary tract infection)     Past Surgical History:  Procedure Laterality Date   COLONOSCOPY N/A 11/05/2014   Procedure: COLONOSCOPY;  Surgeon: Scot Jun, MD;  Location: [facility] ENDOSCOPY;  Service: Endoscopy;  Laterality: N/A;   COLONOSCOPY  11/2016   Dr. Mechele Collin   CYSTOSCOPY  2007   with biopsy    CYSTOSCOPY N/A 06/01/2016   Procedure: CYSTOSCOPY;  Surgeon: Vena Austria, MD;  Location: [facility] ORS;  Service: Gynecology;  Laterality: N/A;    DIAGNOSTIC LAPAROSCOPY     DILATATION & CURETTAGE/HYSTEROSCOPY WITH MYOSURE N/A 04/13/2015   Procedure: DILATATION & CURETTAGE/HYSTEROSCOPY WITH MYOSURE/POLYPECTOMY;  Surgeon: Elenora Fender Ward, MD;  Location: [facility] ORS;  Service: Gynecology;  Laterality: N/A;   DILATATION & CURETTAGE/HYSTEROSCOPY WITH MYOSURE N/A 06/01/2016   Procedure: DILATATION & CURETTAGE/HYSTEROSCOPY WITH MYOSURE;  Surgeon: Vena Austria, MD;  Location: [facility] ORS;  Service: Gynecology;  Laterality: N/A;   DILATION AND CURETTAGE OF UTERUS     ESOPHAGOGASTRODUODENOSCOPY (EGD) WITH PROPOFOL N/A 08/31/2022   Procedure: ESOPHAGOGASTRODUODENOSCOPY (EGD) WITH PROPOFOL;  Surgeon: Jaynie Collins, DO;  Location: [facility] ENDOSCOPY;  Service: Gastroenterology;  Laterality: N/A;   GUM SURGERY     laproscopy  2007   POLYPECTOMY     endometrial    FAMILY HISTORY:  We obtained a detailed, 4-generation family history.  Significant diagnoses are listed below: Family History  Problem Relation Age of Onset   Arthritis Mother    Heart disease Mother    Stroke Mother        TIA   Hypertension Mother    Diabetes Mother    Kidney cancer Mother 63   Heart failure Mother    Hyperlipidemia Mother    Colon cancer Father 72   Arthritis Father    Hyperlipidemia  Father    Transient ischemic attack Father    Cancer - Colon Father 60   Prostate cancer Father 54   Valvular heart disease Father    Cancer Maternal Grandmother    Cancer Paternal Grandfather        prostate   Breast cancer Cousin 84       has contact   Cristina Wade has a twin sister and a brother, neither have had cancer.   Cristina Wade mother had kidney cancer at 51 and died at 22. A maternal cousin had breast cancer at 2 and is living at 45, unsure if she has had genetic testing.   Cristina Wade father had colon cancer at 47 and prostate cancer at 46. Paternal grandmother had prostate and bladder cancer, unknown ages of diagnosis.   Cristina Wade is unaware of previous family  history of genetic testing for hereditary cancer risks. There is no reported Ashkenazi Jewish ancestry. There is no known consanguinity.  GENETIC COUNSELING ASSESSMENT: Cristina Wade is a 52 y.o. female with a family history of colon, prostate, breast and other cancers which is somewhat suggestive of a hereditary cancer syndrome and predisposition to cancer. We, therefore, discussed and recommended the following at today's visit.   DISCUSSION: We discussed that approximately 10% of colorectal cancer is hereditary. Most cases of hereditary colorectal cancer are associated with Lynch syndrome genes, although there are other genes associated with hereditary cancer as well. Cancers and risks are gene specific. We discussed that testing is beneficial for several reasons including knowing about cancer risks, identifying potential screening and risk-reduction options that may be appropriate, and to understand if other family members could be at risk for cancer and allow them to undergo genetic testing.   We reviewed the characteristics, features and inheritance patterns of hereditary cancer syndromes. We also discussed genetic testing, including the appropriate family members to test, the process of testing, insurance coverage and turn-around-time for results. We discussed the implications of a negative, positive and/or variant of uncertain significant result. We recommended Cristina Wade pursue genetic testing for the [facility] CancerNext-Expanded+RNA gene panel.   Based on Cristina Wade's family history of cancer, she meets medical criteria for genetic testing. Despite that she meets criteria, she may still have an out of pocket cost. We discussed that if her out of pocket cost for testing is over $100, the laboratory will call and confirm whether she wants to proceed with testing.  If the out of pocket cost of testing is less than $100 she will be billed by the genetic testing laboratory.   PLAN: After considering the risks,  benefits, and limitations, Cristina Wade provided informed consent to pursue genetic testing and the blood sample was sent to [facility] for analysis of the CancerNext-Expanded+RNA panel. Results should be available within approximately 2-3 weeks' time, at which point they will be disclosed by telephone to Cristina Wade, as will any additional recommendations warranted by these results. Cristina Wade will receive a summary of her genetic counseling visit and a copy of her results once available. This information will also be available in Epic.   Cristina Wade questions were answered to her satisfaction today. Our contact information was provided should additional questions or concerns arise. Thank you for the referral and allowing [place] to share in the care of your patient.   Lacy Duverney, MS, [facility] Genetic Counselor [email or web address] Phone: [phone]  The patient was seen for a total of 20 minutes in face-to-face genetic counseling.  Dr. Orlie Dakin  was available for discussion regarding this case.   _______________________________________________________________________ For Office Staff:  Number of people involved in session: 1 Was an Intern/ student involved with case: no

## 2022-10-15 LAB — CULTURE, URINE COMPREHENSIVE

## 2022-10-16 NOTE — Progress Notes (Unsigned)
03/21/22 4:19 PM   Cristina Wade Aug 16, 1970 [id number]  Referring provider:  [name], FNP [address] [place],  [place] [address]  Urological history  1. High risk hematuria -non-smoker -work up 2015 -bilateral punctate stones -cysto 2019 NED -non-contrast CT 2022 - bilateral punctate stones -cysto, 2022 - NED -RUS (08/2022) - bilateral renal stones -PET scan (09/2022) - NED   2. Neurogenic bladder -UDS 2017 indicating nonobstructive urinary retention with poor bladder sensation and external sphincter dyssynergia -completed 12 weeks of PTNS -completed pelvic floor PT -manages with self cathing three times daily   3. Nephrolithiasis -non-contrast CT 2022 2 mm stone in the right kidney lower pole without hydronephrosis.  Three tiny stones in left kidney lower pole region without hydronephrosis   4. rUTI's -contributing factors of age, incomplete emptying, vaginal atrophy, chronic constipation,  -documented urine cultures over the last year  Aug 22, 2022 -E.coli  August 11, 2018 - E. Coli  March 21, 2022 - MUF  April 12, 2022 - Citrobacter species  January 17, 2022 - E. Coli  November 03, 2022 - no growth           10/21/2021-Enterococcus faecalis  11/02/2021 No Growth  01/17/2022 E.coli    Chief Complaint  Patient presents with   Dysuria     HPI: Cristina Wade is a 52 y.o.female who presents today for difficulty with self-catheterization and urethral hurting.  She is having difficulty sometimes passing the catheter.  Sometimes, she has no issues and other times she has to use 10 catheters before she gets 1 place successfully.  Patient denies any modifying or aggravating factors.  Patient denies any gross hematuria, dysuria or suprapubic/flank pain.  Patient denies any fevers, chills, nausea or vomiting.   PMH: Past Medical History:  Diagnosis Date   Anxiety    Asthma    Chronic pelvic pain in female 2018   Cystitis    Cystocele     Endometrial polyp    Endometriosis    per pt report but never seen during surg   Frequent headaches    Gastritis    GERD (gastroesophageal reflux disease)    rare   Gross hematuria    History of kidney stones    Microscopic hematuria    Migraine    Migraines    Neuropathy    Osteopenia    Urinary disorder    UTI (lower urinary tract infection)     Surgical History: Past Surgical History:  Procedure Laterality Date   COLONOSCOPY N/A 11/05/2014   Procedure: COLONOSCOPY;  Surgeon: Scot Jun, MD;  Location: [facility] ENDOSCOPY;  Service: Endoscopy;  Laterality: N/A;   COLONOSCOPY  11/2016   Dr. Mechele Collin   CYSTOSCOPY  2007   with biopsy    CYSTOSCOPY N/A 06/01/2016   Procedure: CYSTOSCOPY;  Surgeon: Vena Austria, MD;  Location: [facility] ORS;  Service: Gynecology;  Laterality: N/A;   DIAGNOSTIC LAPAROSCOPY     DILATATION & CURETTAGE/HYSTEROSCOPY WITH MYOSURE N/A 04/13/2015   Procedure: DILATATION & CURETTAGE/HYSTEROSCOPY WITH MYOSURE/POLYPECTOMY;  Surgeon: Elenora Fender Ward, MD;  Location: [facility] ORS;  Service: Gynecology;  Laterality: N/A;   DILATATION & CURETTAGE/HYSTEROSCOPY WITH MYOSURE N/A 06/01/2016   Procedure: DILATATION & CURETTAGE/HYSTEROSCOPY WITH MYOSURE;  Surgeon: Vena Austria, MD;  Location: [facility] ORS;  Service: Gynecology;  Laterality: N/A;   GUM SURGERY     laproscopy  2007   POLYPECTOMY     endometrial    Home Medications:  Allergies as of 03/21/2022  Reactions   Milk (cow) Other (See Comments)   Milk-related Compounds    Omeprazole Other (See Comments)   Pt reports "Chest pain, HA and Dysuria." 01/05/22 patient states she is able to take omeprazole now without a reaction   Penicillins Itching, Rash        Medication List        Accurate as of March 21, 2022  4:19 PM. If you have any questions, ask your nurse or doctor.          Azelastine HCl 137 MCG/SPRAY Soln SMARTSIG:2 Spray(s) Both Nares Twice Daily PRN   CALCIUM 1200 PO Take 2-3  tablets by mouth daily. Reported on 05/26/2015   escitalopram 10 MG tablet Commonly known as: LEXAPRO Take 10 mg by mouth daily.   mometasone 50 MCG/ACT nasal spray Commonly known as: NASONEX 2 sprays daily.   MULTIVITAMIN ADULT PO Take 1 tablet by mouth daily.   naproxen 250 MG tablet Commonly known as: NAPROSYN Take 1 tablet (250 mg total) by mouth 2 (two) times daily with a meal.   nitroGLYCERIN 0.4 MG SL tablet Commonly known as: NITROSTAT Place 1 tablet (0.4 mg total) under the tongue every 5 (five) minutes as needed for up to 25 days for chest pain (Maximum of 3 doses.).   omeprazole 20 MG capsule Commonly known as: PRILOSEC Take 1 capsule (20 mg total) by mouth daily.   polyethylene glycol 17 g packet Commonly known as: MIRALAX / GLYCOLAX Take 17 g by mouth daily.   Povidone-Iodine 7.5 % Swab Apply 1 applicator topically as needed.   PROBIOTIC DAILY PO Take 1 tablet by mouth daily.   psyllium 0.52 g capsule Commonly known as: REGULOID Take 0.52 g by mouth daily.   ranolazine 500 MG 12 hr tablet Commonly known as: Ranexa Take 1 tablet (500 mg total) by mouth 2 (two) times daily.   ZINC OXIDE PO Take 1 tablet by mouth daily.        Allergies:  Allergies  Allergen Reactions   Milk (Cow) Other (See Comments)   Milk-Related Compounds    Omeprazole Other (See Comments)    Pt reports "Chest pain, HA and Dysuria." 01/05/22 patient states she is able to take omeprazole now without a reaction   Penicillins Itching and Rash    Family History: Family History  Problem Relation Age of Onset   Arthritis Mother    Heart disease Mother    Stroke Mother        TIA   Hypertension Mother    Diabetes Mother    Kidney cancer Mother 9   Heart failure Mother    Hyperlipidemia Mother    Colon cancer Father 55   Arthritis Father    Hyperlipidemia Father    Transient ischemic attack Father    Cancer - Colon Father 2   Prostate cancer Father 67   Valvular  heart disease Father    Cancer Maternal Grandmother    Cancer Paternal Grandfather        prostate   Breast cancer Cousin 55       has contact    Social History:  reports that she has never smoked. She has never used smokeless tobacco. She reports that she does not drink alcohol and does not use drugs.   Physical Exam: BP 120/75   Pulse 66   Ht 5\' 10"  (1.778 m)   Wt 127 lb (57.6 kg)   LMP 11/03/2021 (Exact Date)   BMI 18.22 kg/m  Constitutional:  Well nourished. Alert and oriented, No acute distress. HEENT: [place] AT, moist mucus membranes.  Trachea midline Cardiovascular: No clubbing, cyanosis, or edema. Respiratory: Normal respiratory effort, no increased work of breathing. GU: No CVA tenderness.  No bladder fullness or masses.   Normal external genitalia, normal pubic hair distribution, no lesions.  Normal urethral meatus, no lesions, no prolapse, no discharge.   No urethral masses, tenderness and/or tenderness. No bladder fullness, tenderness or masses. Normal vagina mucosa, good estrogen effect, no discharge, no lesions, good pelvic support, no cystocele and no rectocele noted.  Anus and perineum are without rashes or lesions.    Neurologic: Grossly intact, no focal deficits, moving all 4 extremities. Psychiatric: Normal mood and affect.    Laboratory Data: N/A   Pertinent imaging N/A  Assessment & Plan:    1. High risk hematuria -non-smoker -work up x 2 - NED -no reports of gross heme  2. Neurogenic bladder  -I catheterized her in the office without issue -her difficulty may be due to being tense when cathing herself -I advised her to relax until he catheterize when she is not in a hurry and in a calm frame of mind I also wiggling her toes may help as well -She will contact me via MyChart if she finds it helpful  3. UTI -she completes her antibiotic today  Michiel Cowboy, PA-C   [facility] [address] [place],  [place] [address] [phone]  I spent 30 minutes on the day of the encounter to include pre-visit record review, face-to-face time with the patient, and post-visit ordering of tests.

## 2022-10-17 ENCOUNTER — Encounter: Payer: Self-pay | Admitting: Urology

## 2022-10-17 ENCOUNTER — Ambulatory Visit (INDEPENDENT_AMBULATORY_CARE_PROVIDER_SITE_OTHER): Payer: BC Managed Care – PPO | Admitting: Urology

## 2022-10-17 VITALS — BP 101/62 | HR 79 | Ht 70.0 in | Wt 121.0 lb

## 2022-10-17 DIAGNOSIS — R319 Hematuria, unspecified: Secondary | ICD-10-CM

## 2022-10-17 DIAGNOSIS — N319 Neuromuscular dysfunction of bladder, unspecified: Secondary | ICD-10-CM | POA: Diagnosis not present

## 2022-10-17 DIAGNOSIS — N390 Urinary tract infection, site not specified: Secondary | ICD-10-CM | POA: Diagnosis not present

## 2022-10-17 LAB — CULTURE, URINE COMPREHENSIVE

## 2022-10-20 MED ORDER — TAMSULOSIN HCL 0.4 MG PO CAPS: 0.4000 mg | ORAL_CAPSULE | Freq: Every day | ORAL | 0 refills | Status: DC

## 2022-10-24 ENCOUNTER — Telehealth: Payer: Self-pay | Admitting: Licensed Clinical Social Worker

## 2022-10-24 ENCOUNTER — Ambulatory Visit: Payer: Self-pay | Admitting: Licensed Clinical Social Worker

## 2022-10-24 ENCOUNTER — Encounter: Payer: Self-pay | Admitting: Licensed Clinical Social Worker

## 2022-10-24 DIAGNOSIS — Z1379 Encounter for other screening for genetic and chromosomal anomalies: Secondary | ICD-10-CM | POA: Insufficient documentation

## 2022-10-24 NOTE — Telephone Encounter (Signed)
I contacted Ms. Mone to discuss her genetic testing results. No pathogenic variants were identified in the 71 genes analyzed. Detailed clinic note to follow.   The test report has been scanned into EPIC and is located under the Molecular Pathology section of the Results Review tab.  A portion of the result report is included below for reference.      Lacy Duverney, MS, [facility] Genetic Counselor [email or web address] Phone: [phone]

## 2022-10-24 NOTE — Progress Notes (Signed)
HPI:   Ms. Wiederholt was previously seen in the [facility] due to a family history of cancer and concerns regarding a hereditary predisposition to cancer. Please refer to our prior cancer genetics clinic note for more information regarding our discussion, assessment and recommendations, at the time. Ms. Knost recent genetic test results were disclosed to her, as were recommendations warranted by these results. These results and recommendations are discussed in more detail below.  CANCER HISTORY:  Oncology History   No history exists.    FAMILY HISTORY:  We obtained a detailed, 4-generation family history.  Significant diagnoses are listed below: Family History  Problem Relation Age of Onset   Arthritis Mother    Heart disease Mother    Stroke Mother        TIA   Hypertension Mother    Diabetes Mother    Kidney cancer Mother 48   Heart failure Mother    Hyperlipidemia Mother    Colon cancer Father 67   Arthritis Father    Hyperlipidemia Father    Transient ischemic attack Father    Cancer - Colon Father 21   Prostate cancer Father 33   Valvular heart disease Father    Cancer Maternal Grandmother    Cancer Paternal Grandfather        prostate   Breast cancer Cousin 32       has contact    Ms. Iodice has a twin sister and a brother, neither have had cancer.    Ms. Snarski mother had kidney cancer at 84 and died at 69. A maternal cousin had breast cancer at 66 and is living at 33, unsure if she has had genetic testing.    Ms. Bembry father had colon cancer at 80 and prostate cancer at 55. Paternal grandmother had prostate and bladder cancer, unknown ages of diagnosis.    Ms. Zachar is unaware of previous family history of genetic testing for hereditary cancer risks. There is no reported Ashkenazi Jewish ancestry. There is no known consanguinity.  GENETIC TEST RESULTS:  The [facility] CancerNext-Expanded+RNA Panel found no pathogenic mutations.   The  CancerNext-Expanded + RNAinsight gene panel offered by [employer] and includes sequencing and rearrangement analysis for the following 71 genes: AIP, ALK, APC*, ATM*, AXIN2, BAP1, BARD1, BMPR1A, BRCA1*, BRCA2*, BRIP1*, CDC73, CDH1*,CDK4, CDKN1B, CDKN2A, CHEK2*, CTNNA1, DICER1, FH, FLCN, KIF1B, LZTR1, MAX, MEN1, MET, MLH1*, MSH2*, MSH3, MSH6*, MUTYH*, NF1*, NF2, NTHL1, PALB2*, PHOX2B, PMS2*, POT1, PRKAR1A, PTCH1, PTEN*, RAD51C*, RAD51D*,RB1, RET, SDHA, SDHAF2, SDHB, SDHC, SDHD, SMAD4, SMARCA4, SMARCB1, SMARCE1, STK11, SUFU, TMEM127, TP53*,TSC1, TSC2, VHL; EGFR, EGLN1, HOXB13, KIT, MITF, PDGFRA, POLD1 and POLE (sequencing only); EPCAM and GREM1 (deletion/duplication only)..   The test report has been scanned into EPIC and is located under the Molecular Pathology section of the Results Review tab.  A portion of the result report is included below for reference. Genetic testing reported out on 10/21/2022.      Even though a pathogenic variant was not identified, possible explanations for the cancer in the family may include: There may be no hereditary risk for cancer in the family. The cancers in Ms. Kaminskas and/or her family may be sporadic/familial or due to other genetic and environmental factors. There may be a gene mutation in one of these genes that current testing methods cannot detect but that chance is small. There could be another gene that has not yet been discovered, or that we have not yet tested, that is responsible for the  cancer diagnoses in the family.  It is also possible there is a hereditary cause for the cancer in the family that Ms. Pester did not inherit. Therefore, it is important to remain in touch with cancer genetics in the future so that we can continue to offer Ms. Maggard the most up to date genetic testing.   ADDITIONAL GENETIC TESTING:  We discussed with Ms. Criollo that her genetic testing was fairly extensive.  If there are additional relevant genes identified to increase cancer  risk that can be analyzed in the future, we would be happy to discuss and coordinate this testing at that time.    CANCER SCREENING RECOMMENDATIONS:  Ms. Hegger test result is considered negative (normal).  This means that we have not identified a hereditary cause for her family history of cancer at this time.   An individual's cancer risk and medical management are not determined by genetic test results alone. Overall cancer risk assessment incorporates additional factors, including personal medical history, family history, and any available genetic information that may result in a personalized plan for cancer prevention and surveillance. Therefore, it is recommended she continue to follow the cancer management and screening guidelines provided by her primary healthcare provider.  RECOMMENDATIONS FOR FAMILY MEMBERS:   Individuals in this family might be at some increased risk of developing cancer, over the general population risk, due to the family history of cancer.  Individuals in the family should notify their providers of the family history of cancer. We recommend women in this family have a yearly mammogram beginning at age 79, or 83 years younger than the earliest onset of cancer, an annual clinical breast exam, and perform monthly breast self-exams.  Family members should have colonoscopies by at age 33, or earlier, as recommended by their providers. Other members of the family may still carry a pathogenic variant in one of these genes that Ms. Baringer did not inherit. Based on the family history, we recommend her maternal relatives, especially her cousin who had breast cancer at 45, have genetic counseling and testing. Ms. Madey will let us know if we can be of any assistance in coordinating genetic counseling and/or testing for this family member.    FOLLOW-UP:  Lastly, we discussed with Ms. Laue that cancer genetics is a rapidly advancing field and it is possible that new genetic tests will be  appropriate for her and/or her family members in the future. We encouraged her to remain in contact with cancer genetics on an annual basis so we can update her personal and family histories and let her know of advances in cancer genetics that may benefit this family.   Our contact number was provided. Ms. Vassar questions were answered to her satisfaction, and she knows she is welcome to call us at anytime with additional questions or concerns.    Lacy Duverney, MS, [facility] Genetic Counselor [email or web address] Phone: [phone]

## 2022-11-01 ENCOUNTER — Inpatient Hospital Stay: Payer: BC Managed Care – PPO | Attending: Oncology

## 2022-11-13 ENCOUNTER — Telehealth: Payer: Self-pay | Admitting: Family

## 2022-11-13 NOTE — Telephone Encounter (Signed)
Patient need lab orders.

## 2022-11-15 NOTE — Telephone Encounter (Signed)
Spoke to pt and scheduled appt for Mon August 5th

## 2022-11-17 ENCOUNTER — Other Ambulatory Visit: Payer: Self-pay | Admitting: Urology

## 2022-11-17 ENCOUNTER — Other Ambulatory Visit (INDEPENDENT_AMBULATORY_CARE_PROVIDER_SITE_OTHER): Payer: Self-pay | Admitting: Nurse Practitioner

## 2022-11-17 DIAGNOSIS — M79604 Pain in right leg: Secondary | ICD-10-CM

## 2022-11-20 ENCOUNTER — Ambulatory Visit (INDEPENDENT_AMBULATORY_CARE_PROVIDER_SITE_OTHER): Payer: BC Managed Care – PPO

## 2022-11-20 ENCOUNTER — Ambulatory Visit: Payer: BC Managed Care – PPO | Admitting: Family

## 2022-11-20 ENCOUNTER — Other Ambulatory Visit: Payer: BC Managed Care – PPO

## 2022-11-20 DIAGNOSIS — M79604 Pain in right leg: Secondary | ICD-10-CM

## 2022-11-20 DIAGNOSIS — M79605 Pain in left leg: Secondary | ICD-10-CM | POA: Diagnosis not present

## 2022-11-22 ENCOUNTER — Encounter: Payer: Self-pay | Admitting: Family

## 2022-11-22 ENCOUNTER — Encounter: Payer: Self-pay | Admitting: Podiatry

## 2022-11-22 ENCOUNTER — Ambulatory Visit (INDEPENDENT_AMBULATORY_CARE_PROVIDER_SITE_OTHER): Payer: BC Managed Care – PPO | Admitting: Podiatry

## 2022-11-22 ENCOUNTER — Ambulatory Visit: Payer: BC Managed Care – PPO

## 2022-11-22 ENCOUNTER — Ambulatory Visit: Payer: BC Managed Care – PPO | Admitting: Family

## 2022-11-22 VITALS — BP 116/76 | HR 98 | Temp 97.5°F | Ht 69.0 in | Wt 120.0 lb

## 2022-11-22 DIAGNOSIS — Z136 Encounter for screening for cardiovascular disorders: Secondary | ICD-10-CM | POA: Diagnosis not present

## 2022-11-22 DIAGNOSIS — R0602 Shortness of breath: Secondary | ICD-10-CM | POA: Diagnosis not present

## 2022-11-22 DIAGNOSIS — R0981 Nasal congestion: Secondary | ICD-10-CM

## 2022-11-22 DIAGNOSIS — Z13220 Encounter for screening for lipoid disorders: Secondary | ICD-10-CM | POA: Diagnosis not present

## 2022-11-22 DIAGNOSIS — D2372 Other benign neoplasm of skin of left lower limb, including hip: Secondary | ICD-10-CM | POA: Diagnosis not present

## 2022-11-22 DIAGNOSIS — D2371 Other benign neoplasm of skin of right lower limb, including hip: Secondary | ICD-10-CM

## 2022-11-22 LAB — COMPREHENSIVE METABOLIC PANEL
ALT: 25 U/L (ref 0–35)
AST: 29 U/L (ref 0–37)
Albumin: 4.8 g/dL (ref 3.5–5.2)
Alkaline Phosphatase: 62 U/L (ref 39–117)
BUN: 15 mg/dL (ref 6–23)
CO2: 30 mEq/L (ref 19–32)
Calcium: 10 mg/dL (ref 8.4–10.5)
Chloride: 102 mEq/L (ref 96–112)
Creatinine, Ser: 0.68 mg/dL (ref 0.40–1.20)
GFR: 98.88 mL/min (ref >60.00)
Glucose, Bld: 89 mg/dL (ref 70–99)
Potassium: 4.1 mEq/L (ref 3.5–5.1)
Sodium: 137 mEq/L (ref 135–145)
Total Bilirubin: 0.6 mg/dL (ref 0.2–1.2)
Total Protein: 7.6 g/dL (ref 6.0–8.3)

## 2022-11-22 LAB — LIPID PANEL
Cholesterol: 175 mg/dL (ref 0–200)
HDL: 76.7 mg/dL (ref >39.00)
LDL Cholesterol: 91 mg/dL (ref 0–99)
NonHDL: 98.66
Total CHOL/HDL Ratio: 2
Triglycerides: 37 mg/dL (ref 0.0–149.0)
VLDL: 7.4 mg/dL (ref 0.0–40.0)

## 2022-11-22 NOTE — Assessment & Plan Note (Addendum)
Walking sa02 remained 96% and HR 88. No acute respiratory distress.  She is not short of breath today. Symptom only occurs when laying down and resolves with 2 pillows under her head.  I suspect nasal congestion is playing a role.  We discussed whether deconditioning is playing a role as over the the past 3 months, patient has not been exercising as she previously had with broken ankle.  We discussed gradually returning to exercise and increasing walking minutes over the course of several weeks. Trial claritin for nasal congestion. pending cxr.  Advised patient let me know right away of any acute changes or worsening.Close follow up.

## 2022-11-22 NOTE — Progress Notes (Signed)
She presents today chief complaint of painful calluses plantar aspect of the bilateral foot.  Objective: Vitals are stable alert oriented x 3.  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  Assessment: Benign skin lesions.  Plan: Debridement of benign skin lesions today follow-up with [place] as needed.

## 2022-11-22 NOTE — Progress Notes (Signed)
Assessment & Plan:  Encounter for lipid screening for cardiovascular disease -     Comprehensive metabolic panel -     Lipid panel  SOB (shortness of breath) Assessment & Plan: Walking sa02 remained 96% and HR 88. No acute respiratory distress.  She is not short of breath today. Symptom only occurs when laying down and resolves with 2 pillows under her head.  I suspect nasal congestion is playing a role.  We discussed whether deconditioning is playing a role as over the the past 3 months, patient has not been exercising as she previously had with broken ankle.  We discussed gradually returning to exercise and increasing walking minutes over the course of several weeks. Trial claritin for nasal congestion. pending cxr.  Advised patient let me know right away of any acute changes or worsening.Close follow up.   Orders: -     DG Chest 2 View; Future     Return precautions given.   Risks, benefits, and alternatives of the medications and treatment plan prescribed today were discussed, and patient expressed understanding.   Education regarding symptom management and diagnosis given to patient on AVS either electronically or printed.  Return in about 6 weeks (around 01/03/2023).  Rennie Plowman, FNP  Subjective:    Patient ID: Cristina Wade, female    DOB: May 27, 1970, 52 y.o.   MRN: [id number]  CC: Cristina Wade is a 52 y.o. female who presents today for follow up.   HPI: Accompanied by sister   Complains of SOB x 3 weeks when she is laying down. Nasal congestion impairs breathing her nose.  Resolves with 2 pillows under her head.   Endorses nasal congestion x 1 week No SOB today.  No associated epigastric pain, wheezing, cough, CP, syncope, leg swelling  She is not walking for exercise as much over the past 3 months due to left ankle lateral malleolus avulsion fracture with foot and ankle sprain 3 months ago. Nonsurgical.  She is following with [facility].  Previously walked  for 5 days without SOB.   She would like screening labs today    No HRT.  Never smoker  Allergies: Milk (cow), Milk-related compounds, Omeprazole, and Penicillins Current Outpatient Medications on File Prior to Visit  Medication Sig Dispense Refill   Calcium Carbonate-Vit D-Min (CALCIUM 1200 PO) Take 2-3 tablets by mouth daily. Reported on 05/26/2015     cholecalciferol (VITAMIN D3) 10 MCG (400 UNIT) TABS tablet Take 400 Units by mouth daily.     Multiple Vitamin (MULTIVITAMIN ADULT PO) Take 1 tablet by mouth daily.     pantoprazole (PROTONIX) 40 MG tablet Take 1 tablet (40 mg total) by mouth 2 (two) times daily before a meal. 60 tablet 1   polyethylene glycol (MIRALAX / GLYCOLAX) 17 g packet Take 17 g by mouth daily.     Probiotic Product (PROBIOTIC DAILY PO) Take 1 tablet by mouth daily.     psyllium (REGULOID) 0.52 g capsule Take 0.52 g by mouth daily.     tamsulosin (FLOMAX) 0.4 MG CAPS capsule Take 1 capsule by mouth once daily 30 capsule 0   ZINC OXIDE PO Take 1 tablet by mouth daily.     Current Facility-Administered Medications on File Prior to Visit  Medication Dose Route Frequency Provider Last Rate Last Admin   lidocaine (XYLOCAINE) 2 % jelly 1 application  1 application  Urethral Once Sondra Come, MD        Review of Systems  Constitutional:  Negative  for chills and fever.  HENT:  Positive for congestion. Negative for rhinorrhea, sinus pressure, sinus pain, sneezing and sore throat.   Respiratory:  Positive for shortness of breath. Negative for cough.   Cardiovascular:  Negative for chest pain, palpitations and leg swelling.  Gastrointestinal:  Negative for nausea and vomiting.      Objective:    BP 116/76   Pulse 98   Temp (!) 97.5 F (36.4 C) (Oral)   Ht 5\' 9"  (1.753 m)   Wt 120 lb (54.4 kg)   LMP 11/03/2021 (Exact Date)   SpO2 98%   BMI 17.72 kg/m  BP Readings from Last 3 Encounters:  11/22/22 116/76  10/17/22 101/62  10/11/22 102/65    Wt Readings from Last 3 Encounters:  11/22/22 120 lb (54.4 kg)  10/17/22 121 lb (54.9 kg)  10/11/22 121 lb 8 oz (55.1 kg)    Physical Exam Vitals reviewed.  Constitutional:      Appearance: She is well-developed.  HENT:     Head: Normocephalic and atraumatic.     Right Ear: Hearing, tympanic membrane, ear canal and external ear normal. No decreased hearing noted. No drainage, swelling or tenderness. No middle ear effusion. No foreign body. Tympanic membrane is not erythematous or bulging.     Left Ear: Hearing, tympanic membrane, ear canal and external ear normal. No decreased hearing noted. No drainage, swelling or tenderness.  No middle ear effusion. No foreign body. Tympanic membrane is not erythematous or bulging.     Nose: Nose normal. No rhinorrhea.     Right Sinus: No maxillary sinus tenderness or frontal sinus tenderness.     Left Sinus: No maxillary sinus tenderness or frontal sinus tenderness.     Mouth/Throat:     Pharynx: Uvula midline. No oropharyngeal exudate or posterior oropharyngeal erythema.     Tonsils: No tonsillar abscesses.  Eyes:     Conjunctiva/sclera: Conjunctivae normal.  Cardiovascular:     Rate and Rhythm: Regular rhythm.     Pulses: Normal pulses.     Heart sounds: Normal heart sounds.  Pulmonary:     Effort: Pulmonary effort is normal.     Breath sounds: Normal breath sounds. No wheezing, rhonchi or rales.  Musculoskeletal:     Right lower leg: No edema.     Left lower leg: No edema.  Lymphadenopathy:     Head:     Right side of head: No submental, submandibular, tonsillar, preauricular, posterior auricular or occipital adenopathy.     Left side of head: No submental, submandibular, tonsillar, preauricular, posterior auricular or occipital adenopathy.     Cervical: No cervical adenopathy.  Skin:    General: Skin is warm and dry.  Neurological:     Mental Status: She is alert.  Psychiatric:        Speech: Speech normal.        Behavior:  Behavior normal.        Thought Content: Thought content normal.

## 2022-11-22 NOTE — Patient Instructions (Addendum)
Trial of claritin  Slowly increase walking again.   Please let me know of acute changes.

## 2022-11-22 NOTE — Progress Notes (Deleted)
Subjective:  Patient ID: Cristina Wade, female    DOB: [birth year],  MRN: [id number] HPI Chief Complaint  Patient presents with   Callouses    Sub 5th MPJ bilateral  "I got these corns"    52 y.o. female presents with the above complaint.   ***  Past Medical History:  Diagnosis Date   Anxiety    Asthma    Chronic pelvic pain in female 2018   Cystitis    Cystocele    Endometrial polyp    Endometriosis    per pt report but never seen during surg   Frequent headaches    Gastritis    GERD (gastroesophageal reflux disease)    rare   Gross hematuria    History of kidney stones    Microscopic hematuria    Migraine    Migraines    Neuropathy    Osteopenia    Urinary disorder    UTI (lower urinary tract infection)    Past Surgical History:  Procedure Laterality Date   COLONOSCOPY N/A 11/05/2014   Procedure: COLONOSCOPY;  Surgeon: Scot Jun, MD;  Location: [facility] ENDOSCOPY;  Service: Endoscopy;  Laterality: N/A;   COLONOSCOPY  11/2016   Dr. Mechele Collin   CYSTOSCOPY  2007   with biopsy    CYSTOSCOPY N/A 06/01/2016   Procedure: CYSTOSCOPY;  Surgeon: Vena Austria, MD;  Location: [facility] ORS;  Service: Gynecology;  Laterality: N/A;   DIAGNOSTIC LAPAROSCOPY     DILATATION & CURETTAGE/HYSTEROSCOPY WITH MYOSURE N/A 04/13/2015   Procedure: DILATATION & CURETTAGE/HYSTEROSCOPY WITH MYOSURE/POLYPECTOMY;  Surgeon: Elenora Fender Ward, MD;  Location: [facility] ORS;  Service: Gynecology;  Laterality: N/A;   DILATATION & CURETTAGE/HYSTEROSCOPY WITH MYOSURE N/A 06/01/2016   Procedure: DILATATION & CURETTAGE/HYSTEROSCOPY WITH MYOSURE;  Surgeon: Vena Austria, MD;  Location: [facility] ORS;  Service: Gynecology;  Laterality: N/A;   DILATION AND CURETTAGE OF UTERUS     ESOPHAGOGASTRODUODENOSCOPY (EGD) WITH PROPOFOL N/A 08/31/2022   Procedure: ESOPHAGOGASTRODUODENOSCOPY (EGD) WITH PROPOFOL;  Surgeon: Jaynie Collins, DO;  Location: [facility] ENDOSCOPY;  Service: Gastroenterology;  Laterality: N/A;   GUM  SURGERY     laproscopy  2007   POLYPECTOMY     endometrial    Current Outpatient Medications:    Calcium Carbonate-Vit D-Min (CALCIUM 1200 PO), Take 2-3 tablets by mouth daily. Reported on 05/26/2015, Disp: , Rfl:    cholecalciferol (VITAMIN D3) 10 MCG (400 UNIT) TABS tablet, Take 400 Units by mouth daily., Disp: , Rfl:    Multiple Vitamin (MULTIVITAMIN ADULT PO), Take 1 tablet by mouth daily., Disp: , Rfl:    pantoprazole (PROTONIX) 40 MG tablet, Take 1 tablet (40 mg total) by mouth 2 (two) times daily before a meal., Disp: 60 tablet, Rfl: 1   polyethylene glycol (MIRALAX / GLYCOLAX) 17 g packet, Take 17 g by mouth daily., Disp: , Rfl:    Probiotic Product (PROBIOTIC DAILY PO), Take 1 tablet by mouth daily., Disp: , Rfl:    psyllium (REGULOID) 0.52 g capsule, Take 0.52 g by mouth daily., Disp: , Rfl:    tamsulosin (FLOMAX) 0.4 MG CAPS capsule, Take 1 capsule by mouth once daily, Disp: 30 capsule, Rfl: 0   ZINC OXIDE PO, Take 1 tablet by mouth daily., Disp: , Rfl:   Current Facility-Administered Medications:    lidocaine (XYLOCAINE) 2 % jelly 1 application, 1 application , Urethral, Once, Sninsky, Laurette Schimke, MD  Allergies  Allergen Reactions   Milk (Cow) Other (See Comments)   Milk-Related Compounds    Omeprazole Other (  See Comments)    Pt reports "Chest pain, HA and Dysuria." 01/05/22 patient states she is able to take omeprazole now without a reaction   Penicillins Itching and Rash   Review of Systems Objective:  There were no vitals filed for this visit.  General: Well developed, nourished, in no acute distress, alert and oriented x3   Dermatological: Skin is warm, dry and supple bilateral. Nails x 10 are well maintained; remaining integument appears unremarkable at this time. There are no open sores, no preulcerative lesions, no rash or signs of infection present.  Vascular: Dorsalis Pedis artery and Posterior Tibial artery pedal pulses are 2/4 bilateral with immedate capillary  fill time. Pedal hair growth present. No varicosities and no lower extremity edema present bilateral.   Neruologic: Grossly intact via light touch bilateral. Vibratory intact via tuning fork bilateral. Protective threshold with Semmes Wienstein monofilament intact to all pedal sites bilateral. Patellar and Achilles deep tendon reflexes 2+ bilateral. No Babinski or clonus noted bilateral.   Musculoskeletal: No gross boney pedal deformities bilateral. No pain, crepitus, or limitation noted with foot and ankle range of motion bilateral. Muscular strength 5/5 in all groups tested bilateral.  Gait: Unassisted, Nonantalgic.    Radiographs:  ***  Assessment & Plan:   Assessment: ***  Plan: ***      T. [place], [place]

## 2022-11-27 ENCOUNTER — Ambulatory Visit: Payer: BC Managed Care – PPO | Admitting: Family

## 2022-11-28 NOTE — Telephone Encounter (Signed)
Patient called back, she is hurting in her bladder, burning with urination and odor.

## 2022-11-28 NOTE — Telephone Encounter (Signed)
.  left message to have patient return my call.  

## 2022-11-30 ENCOUNTER — Encounter (INDEPENDENT_AMBULATORY_CARE_PROVIDER_SITE_OTHER): Payer: Self-pay

## 2022-11-30 NOTE — Progress Notes (Deleted)
11/30/22 8:06 AM   Cristina Wade 1970-07-03 [id number]  Referring provider:  [name], FNP [address] [place],  [place] [address]  Urological history  1. High risk hematuria -non-smoker -work up 2015 -bilateral punctate stones -cysto 2019 NED -non-contrast CT 2022 - bilateral punctate stones -cysto, 2022 - NED -RUS (08/2022) - bilateral renal stones -PET scan (09/2022) - NED   2. Neurogenic bladder -UDS 2017 indicating nonobstructive urinary retention with poor bladder sensation and external sphincter dyssynergia -completed 12 weeks of PTNS -completed pelvic floor PT -manages with self cathing three times daily   3. Nephrolithiasis -non-contrast CT 2022 2 mm stone in the right kidney lower pole without hydronephrosis.  Three tiny stones in left kidney lower pole region without hydronephrosis   4. rUTI's -contributing factors of age, incomplete emptying, vaginal atrophy, chronic constipation,  -documented urine cultures over the last year  Aug 22, 2022 -E.coli  August 11, 2018 - E. Coli  March 21, 2022 - MUF  April 12, 2022 - Citrobacter species  January 17, 2022 - E. Coli  November 03, 2022 - no growth           10/21/2021-Enterococcus faecalis  11/02/2021 No Growth  01/17/2022 E.coli   No chief complaint on file.   HPI: Cristina Wade is a 52 y.o.female who presents today for hurting in her bladder, burning with urination and malodorous urine.  Previous records reviewed.   UA ***  PVR ***  PMH: Past Medical History:  Diagnosis Date   Anxiety    Asthma    Chronic pelvic pain in female 2018   Cystitis    Cystocele    Endometrial polyp    Endometriosis    per pt report but never seen during surg   Frequent headaches    Gastritis    GERD (gastroesophageal reflux disease)    rare   Gross hematuria    History of kidney stones    Microscopic hematuria    Migraine    Migraines    Neuropathy    Osteopenia    Urinary disorder    UTI  (lower urinary tract infection)     Surgical History: Past Surgical History:  Procedure Laterality Date   COLONOSCOPY N/A 11/05/2014   Procedure: COLONOSCOPY;  Surgeon: Scot Jun, MD;  Location[facility] ENDOSCOPY;  Service: Endoscopy;  Laterality: N/A;   COLONOSCOPY  11/2016   Dr. Mechele Collin   CYSTOSCOPY  2007   with biopsy    CYSTOSCOPY N/A 06/01/2016   Procedure: CYSTOSCOPY;  Surgeon: Vena Austria, MD;  Location: [facility] ORS;  Service: Gynecology;  Laterality: N/A;   DIAGNOSTIC LAPAROSCOPY     DILATATION & CURETTAGE/HYSTEROSCOPY WITH MYOSURE N/A 04/13/2015   Procedure: DILATATION & CURETTAGE/HYSTEROSCOPY WITH MYOSURE/POLYPECTOMY;  Surgeon: Elenora Fender Ward, MD;  Location: [facility] ORS;  Service: Gynecology;  Laterality: N/A;   DILATATION & CURETTAGE/HYSTEROSCOPY WITH MYOSURE N/A 06/01/2016   Procedure: DILATATION & CURETTAGE/HYSTEROSCOPY WITH MYOSURE;  Surgeon: Vena Austria, MD;  Location: [facility] ORS;  Service: Gynecology;  Laterality: N/A;   DILATION AND CURETTAGE OF UTERUS     ESOPHAGOGASTRODUODENOSCOPY (EGD) WITH PROPOFOL N/A 08/31/2022   Procedure: ESOPHAGOGASTRODUODENOSCOPY (EGD) WITH PROPOFOL;  Surgeon: Jaynie Collins, DO;  Location: [facility] ENDOSCOPY;  Service: Gastroenterology;  Laterality: N/A;   GUM SURGERY     laproscopy  2007   POLYPECTOMY     endometrial    Home Medications:  Allergies as of 12/01/2022       Reactions   Milk (cow)  Other (See Comments)   Milk-related Compounds    Omeprazole Other (See Comments)   Pt reports "Chest pain, HA and Dysuria." 01/05/22 patient states she is able to take omeprazole now without a reaction   Penicillins Itching, Rash        Medication List        Accurate as of November 30, 2022  8:06 AM. If you have any questions, ask your nurse or doctor.          CALCIUM 1200 PO Take 2-3 tablets by mouth daily. Reported on 05/26/2015   cholecalciferol 10 MCG (400 UNIT) Tabs tablet Commonly known as: VITAMIN D3 Take 400 Units  by mouth daily.   MULTIVITAMIN ADULT PO Take 1 tablet by mouth daily.   pantoprazole 40 MG tablet Commonly known as: PROTONIX Take 1 tablet (40 mg total) by mouth 2 (two) times daily before a meal.   polyethylene glycol 17 g packet Commonly known as: MIRALAX / GLYCOLAX Take 17 g by mouth daily.   PROBIOTIC DAILY PO Take 1 tablet by mouth daily.   psyllium 0.52 g capsule Commonly known as: REGULOID Take 0.52 g by mouth daily.   tamsulosin 0.4 MG Caps capsule Commonly known as: FLOMAX Take 1 capsule by mouth once daily   ZINC OXIDE PO Take 1 tablet by mouth daily.        Allergies:  Allergies  Allergen Reactions   Milk (Cow) Other (See Comments)   Milk-Related Compounds    Omeprazole Other (See Comments)    Pt reports "Chest pain, HA and Dysuria." 01/05/22 patient states she is able to take omeprazole now without a reaction   Penicillins Itching and Rash    Family History: Family History  Problem Relation Age of Onset   Arthritis Mother    Heart disease Mother    Stroke Mother        TIA   Hypertension Mother    Diabetes Mother    Kidney cancer Mother 31   Heart failure Mother    Hyperlipidemia Mother    Colon cancer Father 64   Arthritis Father    Hyperlipidemia Father    Transient ischemic attack Father    Cancer - Colon Father 27   Prostate cancer Father 56   Valvular heart disease Father    Cancer Maternal Grandmother    Cancer Paternal Grandfather        prostate   Breast cancer Cousin 100       has contact    Social History:  reports that she has never smoked. She has never been exposed to tobacco smoke. She has never used smokeless tobacco. She reports that she does not drink alcohol and does not use drugs.   Physical Exam: LMP 11/03/2021 (Exact Date)   Constitutional:  Well nourished. Alert and oriented, No acute distress. HEENT: [place] AT, moist mucus membranes.  Trachea midline, no masses. Cardiovascular: No clubbing, cyanosis, or  edema. Respiratory: Normal respiratory effort, no increased work of breathing. GU: No CVA tenderness.  No bladder fullness or masses.  Recession of labia minora, dry, pale vulvar vaginal mucosa and loss of mucosal ridges and folds.  Normal urethral meatus, no lesions, no prolapse, no discharge.   No urethral masses, tenderness and/or tenderness. No bladder fullness, tenderness or masses. *** vagina mucosa, *** estrogen effect, no discharge, no lesions, *** pelvic support, *** cystocele and *** rectocele noted.  No cervical motion tenderness.  Uterus is freely mobile and non-fixed.  No adnexal/parametria masses  or tenderness noted.  Anus and perineum are without rashes or lesions.   ***  Neurologic: Grossly intact, no focal deficits, moving all 4 extremities. Psychiatric: Normal mood and affect.    Laboratory Data: URinalysis *** I have reviewed the labs.   Pertinent imaging N/A  Assessment & Plan:    1. High risk hematuria -non-smoker -work up x 2 - NED -no reports of gross heme  2. Neurogenic bladder  -***  3. rUTI -Her situation is challenging as she has limited insight into her medical condition and how to manage it at home -she is likely colonized, so whenever her urine is sent for culture it will grow out a bacteria -Ua *** -urine culture  Michiel Cowboy, PA-C   [facility] [address], [address] [place], [place] [address] [phone]

## 2022-12-01 ENCOUNTER — Encounter: Payer: Self-pay | Admitting: Family Medicine

## 2022-12-01 ENCOUNTER — Ambulatory Visit: Payer: BC Managed Care – PPO | Admitting: Family Medicine

## 2022-12-01 ENCOUNTER — Ambulatory Visit: Payer: BC Managed Care – PPO | Admitting: Urology

## 2022-12-01 VITALS — BP 110/80 | HR 64 | Temp 97.7°F | Ht 69.0 in | Wt 121.0 lb

## 2022-12-01 DIAGNOSIS — R300 Dysuria: Secondary | ICD-10-CM | POA: Diagnosis not present

## 2022-12-01 LAB — POC URINALSYSI DIPSTICK (AUTOMATED)
Bilirubin, UA: NEGATIVE
Blood, UA: 200
Glucose, UA: NEGATIVE
Ketones, UA: NEGATIVE
Nitrite, UA: POSITIVE
Protein, UA: NEGATIVE
Spec Grav, UA: 1.01 (ref 1.010–1.025)
Urobilinogen, UA: 0.2 E.U./dL
pH, UA: 6 (ref 5.0–8.0)

## 2022-12-01 MED ORDER — SULFAMETHOXAZOLE-TRIMETHOPRIM 800-160 MG PO TABS: 1.0000 | ORAL_TABLET | Freq: Two times a day (BID) | ORAL | 0 refills | Status: DC

## 2022-12-01 NOTE — Progress Notes (Signed)
dysuria: pain with urination, change in odor.  No discharge.  duration of symptoms: 3 days.   abdominal pain: no fevers:no back pain:no vomiting:no Ucx pending U/a d/w pt.    Meds, vitals, and allergies reviewed.  Per HPI unless specifically indicated in ROS section   GEN: nad, alert and oriented HEENT: ncat NECK: supple CV: rrr.  PULM: ctab, no inc wob ABD: soft, +bs, suprapubic area not tender EXT: no edema SKIN: well perfused.  BACK: no CVA pain

## 2022-12-01 NOTE — Assessment & Plan Note (Signed)
Presumed UTI, nontoxic.  Ucx, septra, see AVS.  Routed to PCP as FYI.

## 2022-12-01 NOTE — Patient Instructions (Addendum)
Drink plenty of water and start the antibiotics today.  We'll contact you with your lab report.  Take care.   

## 2022-12-03 LAB — URINE CULTURE
MICRO NUMBER:: 15342040
SPECIMEN QUALITY:: ADEQUATE

## 2022-12-04 ENCOUNTER — Ambulatory Visit (INDEPENDENT_AMBULATORY_CARE_PROVIDER_SITE_OTHER): Payer: BC Managed Care – PPO | Admitting: Vascular Surgery

## 2022-12-04 ENCOUNTER — Encounter (INDEPENDENT_AMBULATORY_CARE_PROVIDER_SITE_OTHER): Payer: Self-pay | Admitting: Vascular Surgery

## 2022-12-04 VITALS — BP 108/72 | HR 79 | Resp 16 | Ht 70.0 in | Wt 123.8 lb

## 2022-12-04 DIAGNOSIS — E785 Hyperlipidemia, unspecified: Secondary | ICD-10-CM

## 2022-12-04 DIAGNOSIS — I872 Venous insufficiency (chronic) (peripheral): Secondary | ICD-10-CM | POA: Diagnosis not present

## 2022-12-04 DIAGNOSIS — I890 Lymphedema, not elsewhere classified: Secondary | ICD-10-CM | POA: Diagnosis not present

## 2022-12-05 ENCOUNTER — Ambulatory Visit: Payer: BC Managed Care – PPO | Admitting: Urology

## 2022-12-05 NOTE — Telephone Encounter (Signed)
Pt was just seen Friday 12/01/2022 by Dr. Para March

## 2022-12-06 ENCOUNTER — Encounter: Payer: Self-pay | Admitting: Emergency Medicine

## 2022-12-06 ENCOUNTER — Emergency Department: Payer: BC Managed Care – PPO

## 2022-12-06 ENCOUNTER — Other Ambulatory Visit: Payer: Self-pay

## 2022-12-06 ENCOUNTER — Emergency Department
Admission: EM | Admit: 2022-12-06 | Discharge: 2022-12-06 | Disposition: A | Discharge status: Home / Self Care | Payer: BC Managed Care – PPO | Attending: Emergency Medicine | Admitting: Emergency Medicine

## 2022-12-06 DIAGNOSIS — R519 Headache, unspecified: Secondary | ICD-10-CM | POA: Insufficient documentation

## 2022-12-06 DIAGNOSIS — R42 Dizziness and giddiness: Secondary | ICD-10-CM | POA: Insufficient documentation

## 2022-12-06 DIAGNOSIS — N3000 Acute cystitis without hematuria: Secondary | ICD-10-CM | POA: Insufficient documentation

## 2022-12-06 DIAGNOSIS — R079 Chest pain, unspecified: Secondary | ICD-10-CM | POA: Diagnosis present

## 2022-12-06 DIAGNOSIS — R0789 Other chest pain: Secondary | ICD-10-CM | POA: Insufficient documentation

## 2022-12-06 DIAGNOSIS — R110 Nausea: Secondary | ICD-10-CM | POA: Insufficient documentation

## 2022-12-06 LAB — CBC
HCT: 41.2 % (ref 36.0–46.0)
Hemoglobin: 13.5 g/dL (ref 12.0–15.0)
MCH: 30.2 pg (ref 26.0–34.0)
MCHC: 32.8 g/dL (ref 30.0–36.0)
MCV: 92.2 fL (ref 80.0–100.0)
Platelets: 196 10*3/uL (ref 150–400)
RBC: 4.47 MIL/uL (ref 3.87–5.11)
RDW: 12.7 % (ref 11.5–15.5)
WBC: 4.4 10*3/uL (ref 4.0–10.5)
nRBC: 0 % (ref 0.0–0.2)

## 2022-12-06 LAB — BASIC METABOLIC PANEL
Anion gap: 9 (ref 5–15)
BUN: 12 mg/dL (ref 6–20)
CO2: 26 mmol/L (ref 22–32)
Calcium: 9.6 mg/dL (ref 8.9–10.3)
Chloride: 101 mmol/L (ref 98–111)
Creatinine, Ser: 0.83 mg/dL (ref 0.44–1.00)
GFR, Estimated: >60 mL/min (ref >60)
Glucose, Bld: 137 mg/dL — ABNORMAL HIGH (ref 70–99)
Potassium: 4.8 mmol/L (ref 3.5–5.1)
Sodium: 136 mmol/L (ref 135–145)

## 2022-12-06 LAB — TROPONIN I (HIGH SENSITIVITY)
Troponin I (High Sensitivity): 3 ng/L (ref <18)
Troponin I (High Sensitivity): <2 ng/L (ref <18)

## 2022-12-06 MED ORDER — ONDANSETRON 4 MG PO TBDP: 4.0000 mg | ORAL_TABLET | Freq: Three times a day (TID) | ORAL | 0 refills | Status: DC | PRN

## 2022-12-06 MED ORDER — CEFDINIR 300 MG PO CAPS: 300.0000 mg | ORAL_CAPSULE | Freq: Two times a day (BID) | ORAL | 0 refills | Status: DC

## 2022-12-06 MED ORDER — ALUM & MAG HYDROXIDE-SIMETH 200-200-20 MG/5ML PO SUSP
30.0000 mL | Freq: Once | ORAL | Status: AC
  Administered 2022-12-06: 30 mL via ORAL
  Filled 2022-12-06: qty 30

## 2022-12-06 MED ORDER — LIDOCAINE VISCOUS HCL 2 % MT SOLN
15.0000 mL | Freq: Once | OROMUCOSAL | Status: AC
  Administered 2022-12-06: 15 mL via OROMUCOSAL
  Filled 2022-12-06: qty 15

## 2022-12-06 MED ORDER — IBUPROFEN 600 MG PO TABS: 600.0000 mg | ORAL_TABLET | Freq: Three times a day (TID) | ORAL | 0 refills | Status: DC | PRN

## 2022-12-06 NOTE — ED Notes (Signed)
Pt endorsing ABD pain, provider notified.

## 2022-12-06 NOTE — ED Notes (Signed)
Pt informed of need for urine sample, unable to provide at this time

## 2022-12-06 NOTE — ED Provider Notes (Signed)
[facility] Provider Note    Event Date/Time   First MD Initiated Contact with Patient 12/06/22 [phone]     (approximate)   History   Chest Pain   HPI  Cristina Wade is a 52 y.o. female  here with chest pain, headache, lightheadedness, flank pain. Pt has multiple complaints. Primary complaint is a dull, mild, aching chest pressure that has been constant x 24 hours. She has had no associated SOB or diaphoresis. She's had some mild nausea. She is currently being tx for a UTI as well and has had urinary urgency, though she has this at baseline and also self catheterizes. She says she has had some mild nausea and b/l flank pain, no vomiting. No fevers. She also reports a dull, generalized HA x 2-3 days. No focal numbness or weakness.        Physical Exam   Triage Vital Signs: ED Triage Vitals  Encounter Vitals Group     BP 12/06/22 0502 123/73     Systolic BP Percentile --      Diastolic BP Percentile --      Pulse Rate 12/06/22 0502 82     Resp 12/06/22 0502 16     Temp 12/06/22 0502 98.6 F (37 C)     Temp src --      SpO2 12/06/22 0502 99 %     Weight 12/06/22 0501 123 lb (55.8 kg)     Height 12/06/22 0501 5\' 10"  (1.778 m)     Head Circumference --      Peak Flow --      Pain Score 12/06/22 0501 4     Pain Loc --      Pain Education --      Exclude from Growth Chart --     Most recent vital signs: Vitals:   12/06/22 0915 12/06/22 0930  BP:    Pulse: 73 67  Resp:    Temp:    SpO2: 100% 97%     General: Awake, no distress.  CV:  Good peripheral perfusion. RRR. No m/r/g. Resp:  Normal work of breathing. Lungs clear to auscultation bilaterally. Abd:  No distention. No tenderness,. No rebound or guarding. Other:  CN intact. Strength 5/5 bl UE and LE. Normal sensation to light touch.   ED Results / Procedures / Treatments   Labs (all labs ordered are listed, but only abnormal results are displayed) Labs Reviewed  BASIC METABOLIC PANEL -  Abnormal; Notable for the following components:      Result Value   Glucose, Bld 137 (*)    All other components within normal limits  CBC  URINALYSIS, ROUTINE W REFLEX MICROSCOPIC  TROPONIN I (HIGH SENSITIVITY)  TROPONIN I (HIGH SENSITIVITY)     EKG Normal sinus rhythm, VR 81. PR 138, QRS 88, QTc 455. No acute ST elevations or depressions. No ischemia or infarct.   RADIOLOGY CXR: Clear CT Stone: No acute abnormality   I also independently reviewed and agree with radiologist interpretations.   PROCEDURES:  Critical Care performed: No  .1-3 Lead EKG Interpretation  Performed by: Shaune Pollack, MD Authorized by: Shaune Pollack, MD     Interpretation: normal     ECG rate:  70-90   ECG rate assessment: normal     Rhythm: sinus rhythm     Ectopy: none     Conduction: normal   Comments:     Indication: Chest pain, discomfort     MEDICATIONS ORDERED IN ED:  Medications  alum & mag hydroxide-simeth (MAALOX/MYLANTA) 200-200-20 MG/5ML suspension 30 mL (30 mLs Oral Given 12/06/22 0730)  lidocaine (XYLOCAINE) 2 % viscous mouth solution 15 mL (15 mLs Mouth/Throat Given 12/06/22 0730)     IMPRESSION / MDM / ASSESSMENT AND PLAN / ED COURSE  I reviewed the triage vital signs and the nursing notes.                              Differential diagnosis includes, but is not limited to, MSK chest pain, angina, ACS, PNA, PTX, gastritis/GERD, diaphragm-related pain from pyelo or nephrolithiasis, ongoing UTI, obstruction  Patient's presentation is most consistent with acute presentation with potential threat to life or bodily function.  The patient is on the cardiac monitor to evaluate for evidence of arrhythmia and/or significant heart rate changes  52 yo F here with multiple complaints, mostly chronic. Her main complaint is CP. EKG is nonischemic, CXR Is clear, and trops neg x 2 - do not suspect ACS. No RUQ pain, n/v, or signs of cholecystitis or pancreatitis. She does have some  mild flank pain which I think could be contributing to her reported CP, and she was recently dx with UTI on 8/16 with h/o same 2/2 self cath. No leukocytosis. No signs of sepsis. Some of her sx also started when she started Bactrim and Ranolazine, which could explain her GI upset (BActrim) and lightheadedness/headache (Ranolazine). No focal neuro deficits. UCx reviewed from Urology shows +E. Coli, sensitive to bactirm.  However, given her correlation of sx with starting ABX and concern for possible pyelo given flank pain, will transition to a cephalosporin, d/c bactirm. Advised her to hold ranolazine if it 's not helping her CP and to hydrate to prevent lightheadedness. No other apparent emergent pathology at this time and many of her complaints have been chronic per review of records.   FINAL CLINICAL IMPRESSION(S) / ED DIAGNOSES   Final diagnoses:  Atypical chest pain  Acute cystitis without hematuria  Acute nonintractable headache, unspecified headache type     Rx / DC Orders   ED Discharge Orders          Ordered    ondansetron (ZOFRAN-ODT) 4 MG disintegrating tablet  Every 8 hours PRN        12/06/22 0946    ibuprofen (ADVIL) 600 MG tablet  Every 8 hours PRN        12/06/22 0946    cefdinir (OMNICEF) 300 MG capsule  2 times daily        12/06/22 0946             Note:  This document was prepared using Dragon voice recognition software and may include unintentional dictation errors.   Shaune Pollack, MD 12/06/22 [phone]

## 2022-12-06 NOTE — ED Notes (Signed)
Patient transported to X-ray 

## 2022-12-06 NOTE — ED Notes (Signed)
Pt to CT at this time.

## 2022-12-06 NOTE — ED Triage Notes (Signed)
Patient ambulatory to triage with steady gait, without difficulty or distress noted; pt reports for  several days  having left sided CP radiating into mid chest accomp by HA and dizziness

## 2022-12-06 NOTE — Discharge Instructions (Signed)
STOP taking the bactrim and switch to Cefdinir  Drink AT LEAST 6-8 glasses of water per day  Take the Zofran for nausea  Take the Motrin for pain and headache

## 2022-12-08 ENCOUNTER — Telehealth: Payer: Self-pay

## 2022-12-08 NOTE — Transitions of Care (Post Inpatient/ED Visit) (Signed)
Unable to speak with pt on phone and left v/m requesting pt to cb [phone].       12/08/2022  Name: Cristina Wade MRN: [id number] DOB: Aug 25, 1970  Today's TOC FU Call Status: Today's TOC FU Call Status:: Unsuccessful Call (1st Attempt) Unsuccessful Call (1st Attempt) Date: 12/08/22  Attempted to reach the patient regarding the most recent Inpatient/ED visit.  Follow Up Plan: Additional outreach attempts will be made to reach the patient to complete the Transitions of Care (Post Inpatient/ED visit) call.   Signature  Lewanda Rife, LPN

## 2022-12-09 ENCOUNTER — Other Ambulatory Visit: Payer: Self-pay | Admitting: Urology

## 2022-12-10 ENCOUNTER — Encounter (INDEPENDENT_AMBULATORY_CARE_PROVIDER_SITE_OTHER): Payer: Self-pay | Admitting: Vascular Surgery

## 2022-12-10 NOTE — Progress Notes (Signed)
MRN : [id number]  Cristina Wade is a 52 y.o. (02-21-1971) female who presents with chief complaint of legs hurt and swell.  History of Present Illness:  The patient returns to the office for followup and review of the noninvasive studies.   There have been no interval changes in lower extremity symptoms. No interval shortening of the patient's claudication distance or development of rest pain symptoms. No new ulcers or wounds have occurred since the last visit.  There have been no significant changes to the patient's overall health care.  The patient denies amaurosis fugax or recent TIA symptoms. There are no documented recent neurological changes noted. There is no history of DVT, PE or superficial thrombophlebitis. The patient denies recent episodes of angina or shortness of breath.   ABI Rt=1.22 (triphasic) and Lt=1.15 (Triphasic)   Duplex ultrasound of the venous system shows trivial reflux in the common femoral veins bilaterally as well as the GSV.   Current Meds  Medication Sig   Calcium Carbonate-Vit D-Min (CALCIUM 1200 PO) Take 2-3 tablets by mouth daily. Reported on 05/26/2015   cholecalciferol (VITAMIN D3) 10 MCG (400 UNIT) TABS tablet Take 400 Units by mouth daily.   Multiple Vitamin (MULTIVITAMIN ADULT PO) Take 1 tablet by mouth daily.   pantoprazole (PROTONIX) 40 MG tablet Take 1 tablet (40 mg total) by mouth 2 (two) times daily before a meal.   polyethylene glycol (MIRALAX / GLYCOLAX) 17 g packet Take 17 g by mouth daily.   Probiotic Product (PROBIOTIC DAILY PO) Take 1 tablet by mouth daily.   psyllium (REGULOID) 0.52 g capsule Take 0.52 g by mouth daily.   ranolazine (RANEXA) 500 MG 12 hr tablet Take 500 mg by mouth 2 (two) times daily.   sulfamethoxazole-trimethoprim (BACTRIM DS) 800-160 MG tablet Take 1 tablet by mouth 2 (two) times daily.   tamsulosin (FLOMAX) 0.4 MG CAPS capsule Take 1 capsule by mouth once daily   ZINC OXIDE PO Take 1 tablet by mouth daily.    Current Facility-Administered Medications for the 12/04/22 encounter (Office Visit) with Gilda Crease, Latina Craver, MD  Medication   lidocaine (XYLOCAINE) 2 % jelly 1 application    Past Medical History:  Diagnosis Date   Anxiety    Asthma    Chronic pelvic pain in female 2018   Cystitis    Cystocele    Endometrial polyp    Endometriosis    per pt report but never seen during surg   Frequent headaches    Gastritis    GERD (gastroesophageal reflux disease)    rare   Gross hematuria    History of kidney stones    Microscopic hematuria    Migraine    Migraines    Neuropathy    Osteopenia    Urinary disorder    UTI (lower urinary tract infection)     Past Surgical History:  Procedure Laterality Date   COLONOSCOPY N/A 11/05/2014   Procedure: COLONOSCOPY;  Surgeon: Scot Jun, MD;  Location: [facility] ENDOSCOPY;  Service: Endoscopy;  Laterality: N/A;   COLONOSCOPY  11/2016   Dr. Mechele Collin   CYSTOSCOPY  2007   with biopsy    CYSTOSCOPY N/A 06/01/2016   Procedure: CYSTOSCOPY;  Surgeon: Vena Austria, MD;  Location: [facility] ORS;  Service: Gynecology;  Laterality: N/A;   DIAGNOSTIC LAPAROSCOPY     DILATATION & CURETTAGE/HYSTEROSCOPY WITH MYOSURE N/A 04/13/2015   Procedure: DILATATION & CURETTAGE/HYSTEROSCOPY WITH MYOSURE/POLYPECTOMY;  Surgeon: Elenora Fender Ward, MD;  Location: [facility] ORS;  Service: Gynecology;  Laterality: N/A;   DILATATION & CURETTAGE/HYSTEROSCOPY WITH MYOSURE N/A 06/01/2016   Procedure: DILATATION & CURETTAGE/HYSTEROSCOPY WITH MYOSURE;  Surgeon: Vena Austria, MD;  Location: [facility] ORS;  Service: Gynecology;  Laterality: N/A;   DILATION AND CURETTAGE OF UTERUS     ESOPHAGOGASTRODUODENOSCOPY (EGD) WITH PROPOFOL N/A 08/31/2022   Procedure: ESOPHAGOGASTRODUODENOSCOPY (EGD) WITH PROPOFOL;  Surgeon: Jaynie Collins, DO;  Location: [facility] ENDOSCOPY;  Service: Gastroenterology;  Laterality: N/A;   GUM SURGERY     laproscopy  2007   POLYPECTOMY     endometrial     Social History Social History   Tobacco Use   Smoking status: Never    Passive exposure: Never   Smokeless tobacco: Never  Vaping Use   Vaping status: Never Used  Substance Use Topics   Alcohol use: No   Drug use: No    Family History Family History  Problem Relation Age of Onset   Arthritis Mother    Heart disease Mother    Stroke Mother        TIA   Hypertension Mother    Diabetes Mother    Kidney cancer Mother 65   Heart failure Mother    Hyperlipidemia Mother    Colon cancer Father 43   Arthritis Father    Hyperlipidemia Father    Transient ischemic attack Father    Cancer - Colon Father 68   Prostate cancer Father 86   Valvular heart disease Father    Cancer Maternal Grandmother    Cancer Paternal Grandfather        prostate   Breast cancer Cousin 35       has contact    Allergies  Allergen Reactions   Milk (Cow) Other (See Comments)   Milk-Related Compounds    Omeprazole Other (See Comments)    Pt reports "Chest pain, HA and Dysuria." 01/05/22 patient states she is able to take omeprazole now without a reaction   Penicillins Itching and Rash     REVIEW OF SYSTEMS (Negative unless checked)  Constitutional: [] Weight loss  [] Fever  [] Chills Cardiac: [] Chest pain   [] Chest pressure   [] Palpitations   [] Shortness of breath when laying flat   [] Shortness of breath with exertion. Vascular:  [] Pain in legs with walking   [x] Pain in legs at rest  [] History of DVT   [] Phlebitis   [x] Swelling in legs   [] Varicose veins   [] Non-healing ulcers Pulmonary:   [] Uses home oxygen   [] Productive cough   [] Hemoptysis   [] Wheeze  [] COPD   [] Asthma Neurologic:  [] Dizziness   [] Seizures   [] History of stroke   [] History of TIA  [] Aphasia   [] Vissual changes   [] Weakness or numbness in arm   [] Weakness or numbness in leg Musculoskeletal:   [] Joint swelling   [] Joint pain   [] Low back pain Hematologic:  [] Easy bruising  [] Easy bleeding   [] Hypercoagulable state    [] Anemic Gastrointestinal:  [] Diarrhea   [] Vomiting  [] Gastroesophageal reflux/heartburn   [] Difficulty swallowing. Genitourinary:  [] Chronic kidney disease   [] Difficult urination  [] Frequent urination   [] Blood in urine Skin:  [] Rashes   [] Ulcers  Psychological:  [] History of anxiety   []  History of major depression.  Physical Examination  Vitals:   12/04/22 1541  BP: 108/72  Pulse: 79  Resp: 16  Weight: 123 lb 12.8 oz (56.2 kg)  Height: 5\' 10"  (1.778 m)   Body mass index is 17.76 kg/m. Gen: WD/WN, NAD Head:  [place]/AT, No temporalis wasting.  Ear/Nose/Throat: Hearing grossly intact, nares w/o erythema or drainage, pinna without lesions Eyes: PER, EOMI, sclera nonicteric.  Neck: Supple, no gross masses.  No JVD.  Pulmonary:  Good air movement, no audible wheezing, no use of accessory muscles.  Cardiac: RRR, precordium not hyperdynamic. Vascular:  scattered varicosities present bilaterally.  Mild venous stasis changes to the legs bilaterally. 1- 2+ soft pitting edema. CEAP C4sEpAsPr   Vessel Right Left  Radial Palpable Palpable  Gastrointestinal: soft, non-distended. No guarding/no peritoneal signs.  Musculoskeletal: M/S 5/5 throughout.  No deformity.  Neurologic: CN 2-12 intact. Pain and light touch intact in extremities.  Symmetrical.  Speech is fluent. Motor exam as listed above. Psychiatric: Judgment intact, Mood & affect appropriate for pt's clinical situation. Dermatologic: Venous rashes no ulcers noted.  No changes consistent with cellulitis. Lymph : No lichenification or skin changes of chronic lymphedema.  CBC Lab Results  Component Value Date   WBC 4.4 12/06/2022   HGB 13.5 12/06/2022   HCT 41.2 12/06/2022   MCV 92.2 12/06/2022   PLT 196 12/06/2022    BMET    Component Value Date/Time   NA 136 12/06/2022 0511   K 4.8 12/06/2022 0511   CL 101 12/06/2022 0511   CO2 26 12/06/2022 0511   GLUCOSE 137 (H) 12/06/2022 0511   BUN 12 12/06/2022 0511   CREATININE 0.83  12/06/2022 0511   CALCIUM 9.6 12/06/2022 0511   GFRNONAA >60 12/06/2022 0511   GFRAA >60 12/11/2019 1524   Estimated Creatinine Clearance: 68.7 mL/min (by C-G formula based on SCr of 0.83 mg/dL).  COAG No results found for: "INR", "PROTIME"  Radiology CT Renal Stone Study  Result Date: 12/06/2022 CLINICAL DATA:  Left-sided flank pain for several days. EXAM: CT ABDOMEN AND PELVIS WITHOUT CONTRAST TECHNIQUE: Multidetector CT imaging of the abdomen and pelvis was performed following the standard protocol without IV contrast. RADIATION DOSE REDUCTION: This exam was performed according to the departmental dose-optimization program which includes automated exposure control, adjustment of the mA and/or kV according to patient size and/or use of iterative reconstruction technique. COMPARISON:  08/31/2022 and 10/04/2020 FINDINGS: Lower chest: No acute findings. Hepatobiliary: 1.9 cm low-attenuation lesion in the anterior right hepatic lobe is more conspicuous and appears increased in size since 2022 exam. This cannot be characterized on this unenhanced exam. Gallbladder is unremarkable. No evidence of biliary ductal dilatation. Pancreas: No mass or inflammatory process visualized on this unenhanced exam. Spleen:  Within normal limits in size. Adrenals/Urinary tract: Tiny 1-2 mm renal calculi are seen in both kidneys. No No evidence of ureteral calculi or hydronephrosis. Stomach/Bowel: No evidence of obstruction, inflammatory process, or abnormal fluid collections. Normal appendix visualized. Vascular/Lymphatic: No pathologically enlarged lymph nodes identified. No evidence of abdominal aortic aneurysm. Reproductive:  No mass or other significant abnormality. Other:  None. Musculoskeletal:  No suspicious bone lesions identified. IMPRESSION: Tiny bilateral renal calculi. No evidence of ureteral calculi, hydronephrosis, or other acute findings. 1.9 cm indeterminate low-attenuation lesion in anterior right hepatic  lobe. Nonemergent outpatient abdomen MRI without and with contrast is recommended for further characterization. Electronically Signed   By: Danae Orleans M.D.   On: 12/06/2022 08:57   DG Chest 2 View  Result Date: 12/06/2022 CLINICAL DATA:  52 year old female with history of chest pain. EXAM: CHEST - 2 VIEW COMPARISON:  Chest x-ray 11/22/2022. FINDINGS: Lung volumes are normal. No consolidative airspace disease. No pleural effusions. No pneumothorax. No pulmonary nodule or mass noted. Pulmonary vasculature and  the cardiomediastinal silhouette are within normal limits. IMPRESSION: No radiographic evidence of acute cardiopulmonary disease. Electronically Signed   By: Trudie Reed M.D.   On: 12/06/2022 06:01   DG Chest 2 View  Result Date: 11/28/2022 CLINICAL DATA:  Shortness of breath. EXAM: CHEST - 2 VIEW COMPARISON:  PA and lateral chest 08/30/2022. CT chest, abdomen and pelvis 08/31/2022. FINDINGS: The lungs are clear. Heart size is normal. No pneumothorax or pleural fluid. No acute or focal bony abnormality. IMPRESSION: Negative chest. Electronically Signed   By: Drusilla Kanner M.D.   On: 11/28/2022 13:47   VAS [place] LOWER EXTREMITY VENOUS REFLUX  Result Date: 11/23/2022  Lower Venous Reflux Study Patient Name:  Cristina Wade  Date of Exam:   11/20/2022 Medical Rec #: [id number]    Accession #:    [id number] Date of Birth: 11/22/1970    Patient Gender: F Patient Age:   71 years Exam Location:  [facility] Procedure:      VAS [place] LOWER EXTREMITY VENOUS REFLUX Referring Phys: Sheppard Plumber --------------------------------------------------------------------------------  Indications: Pain, Swelling, and Edema.  Performing Technologist: Hardie Lora RVT  Examination Guidelines: A complete evaluation includes B-mode imaging, spectral Doppler, color Doppler, and power Doppler as needed of all accessible portions of each vessel. Bilateral testing is considered an integral part of a complete  examination. Limited examinations for reoccurring indications may be performed as noted. The reflux portion of the exam is performed with the patient in reverse Trendelenburg. Significant venous reflux is defined as >500 ms in the superficial venous system, and >1 second in the deep venous system.  Venous Reflux Times +--------------+---------+------+-----------+------------+--------+ RIGHT         Reflux NoRefluxReflux TimeDiameter cmsComments                         Yes                                  +--------------+---------+------+-----------+------------+--------+ CFV           no                                             +--------------+---------+------+-----------+------------+--------+ FV prox       no                                             +--------------+---------+------+-----------+------------+--------+ FV mid        no                                             +--------------+---------+------+-----------+------------+--------+ FV dist       no                                             +--------------+---------+------+-----------+------------+--------+ Popliteal     no                                             +--------------+---------+------+-----------+------------+--------+  GSV at [facility]              yes    >500 ms      0.52             +--------------+---------+------+-----------+------------+--------+ GSV prox thigh          yes    >500 ms      0.49             +--------------+---------+------+-----------+------------+--------+ GSV mid thigh no                            0.53             +--------------+---------+------+-----------+------------+--------+ GSV dist thighno                            0.47             +--------------+---------+------+-----------+------------+--------+ GSV at knee   no                            0.57              +--------------+---------+------+-----------+------------+--------+ GSV prox calf no                            0.46             +--------------+---------+------+-----------+------------+--------+ SSV Pop Fossa no                            0.70             +--------------+---------+------+-----------+------------+--------+ SSV prox calf no                            0.34             +--------------+---------+------+-----------+------------+--------+ SSV mid calf  no                            0.31             +--------------+---------+------+-----------+------------+--------+  +--------------+---------+------+-----------+------------+--------+ LEFT          Reflux NoRefluxReflux TimeDiameter cmsComments                         Yes                                  +--------------+---------+------+-----------+------------+--------+ CFV                     yes   >1 second                      +--------------+---------+------+-----------+------------+--------+ FV prox       no                                             +--------------+---------+------+-----------+------------+--------+ FV mid        no                                             +--------------+---------+------+-----------+------------+--------+  FV dist       no                                             +--------------+---------+------+-----------+------------+--------+ Popliteal     no                                             +--------------+---------+------+-----------+------------+--------+ GSV at SFJ              yes    >500 ms      0.80             +--------------+---------+------+-----------+------------+--------+ GSV prox thighno                            0.60             +--------------+---------+------+-----------+------------+--------+ GSV mid thigh no                            0.59              +--------------+---------+------+-----------+------------+--------+ GSV dist thighno                            0.51             +--------------+---------+------+-----------+------------+--------+ GSV at knee   no                            0.51             +--------------+---------+------+-----------+------------+--------+ GSV prox calf no                            0.55             +--------------+---------+------+-----------+------------+--------+ SSV Pop Fossa no                            0.31             +--------------+---------+------+-----------+------------+--------+ SSV prox calf no                            0.24             +--------------+---------+------+-----------+------------+--------+ SSV mid calf  no                            0.27             +--------------+---------+------+-----------+------------+--------+   Summary: Right: - No evidence of deep vein thrombosis seen in the right lower extremity, from the common femoral through the popliteal veins. - No evidence of superficial venous thrombosis in the right lower extremity. - Venous reflux is noted in the right sapheno-femoral junction. - Venous reflux is noted in the right greater saphenous vein in the thigh.  Left: - No evidence of deep vein thrombosis seen in the left lower extremity, from the common femoral through the popliteal veins. - No evidence of superficial venous thrombosis in  the left lower extremity. - Venous reflux is noted in the left common femoral vein. - Venous reflux is noted in the left sapheno-femoral junction.  *See table(s) above for measurements and observations. Electronically signed by Levora Dredge MD on 11/23/2022 at 3:10:10 PM.    Final    VAS [place] ABI WITH/WO TBI  Result Date: 11/23/2022  LOWER EXTREMITY DOPPLER STUDY Patient Name:  [facility] [place]  Date of Exam:   11/20/2022 Medical Rec #: [id number]    Accession #:    [id number] Date of Birth: 09-29-1970    Patient Gender: F  Patient Age:   63 years Exam Location:  [facility] Procedure:      VAS [place] ABI WITH/WO TBI Referring Phys: Sheppard Plumber --------------------------------------------------------------------------------  Indications: Claudication. High Risk Factors: No history of smoking. Other Factors: Periodic tingling bilaterally and pain in the left leg.  Performing Technologist: Hardie Lora RVT  Examination Guidelines: A complete evaluation includes at minimum, Doppler waveform signals and systolic blood pressure reading at the level of bilateral brachial, anterior tibial, and posterior tibial arteries, when vessel segments are accessible. Bilateral testing is considered an integral part of a complete examination. Photoelectric Plethysmograph (PPG) waveforms and toe systolic pressure readings are included as required and additional duplex testing as needed. Limited examinations for reoccurring indications may be performed as noted.  ABI Findings: +---------+------------------+-----+---------+--------+ Right    Rt Pressure (mmHg)IndexWaveform Comment  +---------+------------------+-----+---------+--------+ Brachial 106                                      +---------+------------------+-----+---------+--------+ PTA      129               1.22 triphasic         +---------+------------------+-----+---------+--------+ DP       115               1.08 triphasic         +---------+------------------+-----+---------+--------+ Great Toe70                0.66                   +---------+------------------+-----+---------+--------+ +---------+------------------+-----+---------+-------+ Left     Lt Pressure (mmHg)IndexWaveform Comment +---------+------------------+-----+---------+-------+ Brachial 102                                     +---------+------------------+-----+---------+-------+ PTA      122               1.15 triphasic         +---------+------------------+-----+---------+-------+ DP       101               0.95 biphasic         +---------+------------------+-----+---------+-------+ Great Toe68                0.64                  +---------+------------------+-----+---------+-------+ +-------+-----------+-----------+------------+------------+ ABI/TBIToday's ABIToday's TBIPrevious ABIPrevious TBI +-------+-----------+-----------+------------+------------+ Right  1.22       0.66                                +-------+-----------+-----------+------------+------------+ Left   1.15       0.64                                +-------+-----------+-----------+------------+------------+  Summary: Right: Resting right ankle-brachial index is within normal range. The right toe-brachial index is abnormal. Left: Resting left ankle-brachial index is within normal range. The left toe-brachial index is abnormal. *See table(s) above for measurements and observations.  Electronically signed by Levora Dredge MD on 11/23/2022 at 3:10:05 PM.    Final      Assessment/Plan 1. Chronic venous insufficiency Recommend:  No surgery or intervention at this point in time.  I have reviewed my discussion with the patient regarding venous insufficiency and why it causes symptoms. I have discussed with the patient the chronic skin changes that accompany venous insufficiency and the long term sequela such as ulceration. Patient will contnue wearing graduated compression stockings on a daily basis, as this has provided excellent control of his edema. The patient will put the stockings on first thing in the morning and removing them in the evening. The patient is reminded not to sleep in the stockings.  In addition, behavioral modification including elevation during the day will be initiated. Exercise is strongly encouraged.  Previous duplex ultrasound of the lower extremities shows normal deep system, no significant superficial  reflux was identified.   The patient will follow up with me PRN should anything change.  The patient voices agreement with this plan.  2. Lymphedema See #1  3. Hyperlipidemia, unspecified hyperlipidemia type Continue statin as ordered and reviewed, no changes at this time     Levora Dredge, MD  12/10/2022 4:45 PM

## 2022-12-11 ENCOUNTER — Other Ambulatory Visit: Payer: Self-pay | Admitting: *Deleted

## 2022-12-11 DIAGNOSIS — N200 Calculus of kidney: Secondary | ICD-10-CM

## 2022-12-11 NOTE — Progress Notes (Signed)
12/12/22 3:34 PM   Cristina Wade December 20, 1970 [id number]  Referring provider:  [name], FNP [address] [place],  [place] [address]  Urological history  1. High risk hematuria -non-smoker -work up 2015 -bilateral punctate stones -cysto 2019 NED -non-contrast CT 2022 - bilateral punctate stones -cysto, 2022 - NED -RUS (08/2022) - bilateral renal stones -PET scan (09/2022) - NED   2. Neurogenic bladder -UDS 2017 indicating nonobstructive urinary retention with poor bladder sensation and external sphincter dyssynergia -completed 12 weeks of PTNS -completed pelvic floor PT -manages with self cathing three times daily   3. Nephrolithiasis -non-contrast CT 2022 2 mm stone in the right kidney lower pole without hydronephrosis.  Three tiny stones in left kidney lower pole region without hydronephrosis   4. rUTI's -contributing factors of age, incomplete emptying, vaginal atrophy, chronic constipation,  -documented urine cultures over the last year  October 11, 2022 - Citrobacter koseri   Aug 22, 2022 -E.coli  August 11, 2022 - E. Coli  March 21, 2022 - MUF  April 12, 2022 - Citrobacter species  January 17, 2022 - E. Coli  November 03, 2022 - no growth           10/21/2021-Enterococcus faecalis  11/02/2021 No Growth  01/17/2022 E.coli   Chief Complaint  Patient presents with   Follow-up    HPI: Cristina Wade is a 52 y.o.female who presents today for passage of stool in her urine.   Previous records reviewed.   She has been having instances when she does not even feel like she has a bowel movement when she goes to wipe she has stool on the toilet paper.  She states this has been going on for weeks.  She states that she has bowel movements daily.  She also states she has been having gross heme on and off and having more difficulty passing her catheter.   She is cathing 3 times daily with PVR is approximately 300 cc  Patient denies any modifying or aggravating  factors.  Patient denies any recent UTI's, dysuria or suprapubic/flank pain.  Patient denies any fevers, chills, nausea or vomiting.   CATH UA yellow clear, specific gravity 1.010, trace blood, pH 7.0, 0-5 WBCs, 0-2 RBCs, 0-10 epithelial cells  KUB significant stool burden.     PMH: Past Medical History:  Diagnosis Date   Anxiety    Asthma    Chronic pelvic pain in female 2018   Cystitis    Cystocele    Endometrial polyp    Endometriosis    per pt report but never seen during surg   Frequent headaches    Gastritis    GERD (gastroesophageal reflux disease)    rare   Gross hematuria    History of kidney stones    Microscopic hematuria    Migraine    Migraines    Neuropathy    Osteopenia    Urinary disorder    UTI (lower urinary tract infection)     Surgical History: Past Surgical History:  Procedure Laterality Date   COLONOSCOPY N/A 11/05/2014   Procedure: COLONOSCOPY;  Surgeon: Scot Jun, MD;  Location: [facility] ENDOSCOPY;  Service: Endoscopy;  Laterality: N/A;   COLONOSCOPY  11/2016   Dr. Mechele Collin   CYSTOSCOPY  2007   with biopsy    CYSTOSCOPY N/A 06/01/2016   Procedure: CYSTOSCOPY;  Surgeon: Vena Austria, MD;  Location: [facility] ORS;  Service: Gynecology;  Laterality: N/A;   DIAGNOSTIC LAPAROSCOPY     DILATATION &  CURETTAGE/HYSTEROSCOPY WITH MYOSURE N/A 04/13/2015   Procedure: DILATATION & CURETTAGE/HYSTEROSCOPY WITH MYOSURE/POLYPECTOMY;  Surgeon: Elenora Fender Ward, MD;  Location: [facility] ORS;  Service: Gynecology;  Laterality: N/A;   DILATATION & CURETTAGE/HYSTEROSCOPY WITH MYOSURE N/A 06/01/2016   Procedure: DILATATION & CURETTAGE/HYSTEROSCOPY WITH MYOSURE;  Surgeon: Vena Austria, MD;  Location: [facility] ORS;  Service: Gynecology;  Laterality: N/A;   DILATION AND CURETTAGE OF UTERUS     ESOPHAGOGASTRODUODENOSCOPY (EGD) WITH PROPOFOL N/A 08/31/2022   Procedure: ESOPHAGOGASTRODUODENOSCOPY (EGD) WITH PROPOFOL;  Surgeon: Jaynie Collins, DO;  Location: [facility]  ENDOSCOPY;  Service: Gastroenterology;  Laterality: N/A;   GUM SURGERY     laproscopy  2007   POLYPECTOMY     endometrial    Home Medications:  Allergies as of 12/12/2022       Reactions   Milk (cow) Other (See Comments)   Milk-related Compounds    Omeprazole Other (See Comments)   Pt reports "Chest pain, HA and Dysuria." 01/05/22 patient states she is able to take omeprazole now without a reaction   Penicillins Itching, Rash        Medication List        Accurate as of December 12, 2022  3:34 PM. If you have any questions, ask your nurse or doctor.          STOP taking these medications    cefdinir 300 MG capsule Commonly known as: OMNICEF   ibuprofen 600 MG tablet Commonly known as: ADVIL   ranolazine 500 MG 12 hr tablet Commonly known as: RANEXA   sulfamethoxazole-trimethoprim 800-160 MG tablet Commonly known as: BACTRIM DS       TAKE these medications    CALCIUM 1200 PO Take 2-3 tablets by mouth daily. Reported on 05/26/2015   cholecalciferol 10 MCG (400 UNIT) Tabs tablet Commonly known as: VITAMIN D3 Take 400 Units by mouth daily.   MULTIVITAMIN ADULT PO Take 1 tablet by mouth daily.   ondansetron 4 MG disintegrating tablet Commonly known as: ZOFRAN-ODT Take 1 tablet (4 mg total) by mouth every 8 (eight) hours as needed for nausea or vomiting.   pantoprazole 40 MG tablet Commonly known as: PROTONIX Take 1 tablet (40 mg total) by mouth 2 (two) times daily before a meal.   polyethylene glycol 17 g packet Commonly known as: MIRALAX / GLYCOLAX Take 17 g by mouth daily.   PROBIOTIC DAILY PO Take 1 tablet by mouth daily.   psyllium 0.52 g capsule Commonly known as: REGULOID Take 0.52 g by mouth daily.   tamsulosin 0.4 MG Caps capsule Commonly known as: FLOMAX Take 1 capsule by mouth once daily   ZINC OXIDE PO Take 1 tablet by mouth daily.        Allergies:  Allergies  Allergen Reactions   Milk (Cow) Other (See Comments)    Milk-Related Compounds    Omeprazole Other (See Comments)    Pt reports "Chest pain, HA and Dysuria." 01/05/22 patient states she is able to take omeprazole now without a reaction   Penicillins Itching and Rash    Family History: Family History  Problem Relation Age of Onset   Arthritis Mother    Heart disease Mother    Stroke Mother        TIA   Hypertension Mother    Diabetes Mother    Kidney cancer Mother 68   Heart failure Mother    Hyperlipidemia Mother    Colon cancer Father 65   Arthritis Father    Hyperlipidemia Father  Transient ischemic attack Father    Cancer - Colon Father 70   Prostate cancer Father 53   Valvular heart disease Father    Cancer Maternal Grandmother    Cancer Paternal Grandfather        prostate   Breast cancer Cousin 19       has contact    Social History:  reports that she has never smoked. She has never been exposed to tobacco smoke. She has never used smokeless tobacco. She reports that she does not drink alcohol and does not use drugs.   Physical Exam: BP 114/68   Pulse 66   Ht 5\' 10"  (1.778 m)   Wt 123 lb (55.8 kg)   LMP 11/03/2021 (Exact Date)   BMI 17.65 kg/m   Constitutional:  Well nourished. Alert and oriented, No acute distress. HEENT: [place] AT, moist mucus membranes.  Trachea midline Cardiovascular: No clubbing, cyanosis, or edema. Respiratory: Normal respiratory effort, no increased work of breathing. Neurologic: Grossly intact, no focal deficits, moving all 4 extremities. Psychiatric: Normal mood and affect.    Laboratory Data: Urinalysis Results for orders placed or performed in visit on 12/12/22  Microscopic Examination   Urine  Result Value Ref Range   WBC, UA 0-5 0 - 5 /hpf   RBC, Urine 0-2 0 - 2 /hpf   Epithelial Cells (non renal) 0-10 0 - 10 /hpf   Bacteria, UA None seen None seen/Few  Urinalysis, Complete  Result Value Ref Range   Specific Gravity, UA 1.010 1.005 - 1.030   pH, UA 7.0 5.0 - 7.5   Color, UA  Yellow Yellow   Appearance Ur Clear Clear   Leukocytes,UA Negative Negative   Protein,UA Negative Negative/Trace   Glucose, UA Negative Negative   Ketones, UA Negative Negative   RBC, UA Trace (A) Negative   Bilirubin, UA Negative Negative   Urobilinogen, Ur 0.2 0.2 - 1.0 mg/dL   Nitrite, UA Negative Negative   Microscopic Examination See below:     I have reviewed the labs.   Pertinent imaging N/A  Assessment & Plan:    1. High risk hematuria -non-smoker -work up x 2 - NED -reports of gross heme -UA negative for micro heme - we discussed that there are a number of causes that can be associated with blood in the urine, such as stones, UTI's, damage to the urinary tract and/or cancer.   Sometimes, we do not find a cause or source of the hematuria.   -we discussed that new guidelines place individuals into risk categories of low, intermediate and high risk categories.  These factors are based on age, smoking history and degree of blood in urine.   -we discussed that at this time, they are in the high risk stratification  -we discussed that the recommended protocol for further work up are are CT urogram and cysto  -we discussed that for a CT urogram a contrast material will be injected into a vein and that in rare instances, an allergic reaction can result and may even life threatening (1:100,000)  The patient denies any allergies to contrast, iodine and/or seafood and is not taking metformin. - we discussed that following the imaging study,  a cystoscopy is performed  -we discussed that a cystoscopy is a procedure that consists of passing a camera up their urethra after administering lidocaine to anesthetize and that after the procedure a minor amount of blood in the urine and/or burning which usually resolves in 24 to 48 hours  may occur  -the patient had the opportunity to ask questions which were answered. Based upon this discussion, the patient is willing to proceed. Therefore, I've  ordered: a CT Urogram and cystoscopy pending urogram results  - The patient will return following all of the above for discussion of the results.  - UA   2. Neurogenic bladder  -continue CIC x 3 daily   3. Stool incontinence -CATH UA clear, so not likely due to a fistula -explained that is likely that the wetter stool is passing around her hard stool and leaking out -refer back to GI for further evaluation   Michiel Cowboy, PA-C   [facility] [address], [address] [place], [place] [address] [phone]

## 2022-12-11 NOTE — Transitions of Care (Post Inpatient/ED Visit) (Signed)
Unable to reach pt by phone and left v/m requesting cb [phone].      12/11/2022  Name: Cristina Wade MRN: [id number] DOB: [birth year]  Today's TOC FU Call Status: Today's TOC FU Call Status:: Unsuccessful Call (2nd Attempt) Unsuccessful Call (1st Attempt) Date: 12/08/22 Unsuccessful Call (2nd Attempt) Date: 12/11/22  Attempted to reach the patient regarding the most recent Inpatient/ED visit.  Follow Up Plan: Additional outreach attempts will be made to reach the patient to complete the Transitions of Care (Post Inpatient/ED visit) call.   Signature Lewanda Rife, LPN

## 2022-12-11 NOTE — Telephone Encounter (Signed)
Appt scheduled

## 2022-12-12 ENCOUNTER — Ambulatory Visit
Admission: RE | Admit: 2022-12-12 | Discharge: 2022-12-12 | Disposition: A | Discharge status: Home / Self Care | Payer: BC Managed Care – PPO | Attending: Urology | Admitting: Urology

## 2022-12-12 ENCOUNTER — Ambulatory Visit: Payer: BC Managed Care – PPO | Admitting: Urology

## 2022-12-12 ENCOUNTER — Ambulatory Visit: Admission: RE | Admit: 2022-12-12 | Payer: BC Managed Care – PPO | Source: Ambulatory Visit

## 2022-12-12 ENCOUNTER — Encounter: Payer: Self-pay | Admitting: Urology

## 2022-12-12 VITALS — BP 114/68 | HR 66 | Ht 70.0 in | Wt 123.0 lb

## 2022-12-12 DIAGNOSIS — R310 Gross hematuria: Secondary | ICD-10-CM

## 2022-12-12 DIAGNOSIS — N319 Neuromuscular dysfunction of bladder, unspecified: Secondary | ICD-10-CM

## 2022-12-12 DIAGNOSIS — Z87440 Personal history of urinary (tract) infections: Secondary | ICD-10-CM | POA: Diagnosis not present

## 2022-12-12 DIAGNOSIS — R159 Full incontinence of feces: Secondary | ICD-10-CM

## 2022-12-12 DIAGNOSIS — R319 Hematuria, unspecified: Secondary | ICD-10-CM

## 2022-12-12 DIAGNOSIS — N200 Calculus of kidney: Secondary | ICD-10-CM | POA: Diagnosis present

## 2022-12-12 DIAGNOSIS — N390 Urinary tract infection, site not specified: Secondary | ICD-10-CM

## 2022-12-12 LAB — MICROSCOPIC EXAMINATION: Bacteria, UA: NONE SEEN

## 2022-12-12 LAB — URINALYSIS, COMPLETE
Bilirubin, UA: NEGATIVE
Glucose, UA: NEGATIVE
Ketones, UA: NEGATIVE
Leukocytes,UA: NEGATIVE
Nitrite, UA: NEGATIVE
Protein,UA: NEGATIVE
Specific Gravity, UA: 1.01 (ref 1.005–1.030)
Urobilinogen, Ur: 0.2 mg/dL (ref 0.2–1.0)
pH, UA: 7 (ref 5.0–7.5)

## 2022-12-12 NOTE — Progress Notes (Signed)
In and Out Catheterization  Patient is present today for a I & O catheterization due to cath ua. Patient was cleaned and prepped in a sterile fashion with betadine . A 14FR cath was inserted no complications were noted , 200 ml of urine return was noted, urine was clear yellow in color. A clean urine sample was collected for UA . Bladder was drained  And catheter was removed with out difficulty.    Performed by: Salome Spotted, RMA  Follow up/ Additional notes: pt tolerated well

## 2022-12-21 ENCOUNTER — Ambulatory Visit
Admission: RE | Admit: 2022-12-21 | Discharge: 2022-12-21 | Disposition: A | Discharge status: Home / Self Care | Payer: BC Managed Care – PPO | Source: Ambulatory Visit | Attending: Urology | Admitting: Urology

## 2022-12-21 DIAGNOSIS — R310 Gross hematuria: Secondary | ICD-10-CM | POA: Diagnosis present

## 2022-12-21 MED ORDER — IOHEXOL 300 MG/ML  SOLN
100.0000 mL | Freq: Once | INTRAMUSCULAR | Status: AC | PRN
  Administered 2022-12-21: 100 mL via INTRAVENOUS

## 2022-12-25 NOTE — Group Note (Deleted)

## 2023-01-02 ENCOUNTER — Telehealth: Payer: Self-pay | Admitting: Internal Medicine

## 2023-01-02 NOTE — Telephone Encounter (Signed)
I do not typically prescribe muscle relaxants.  I recommend that Ms. Boitnott reach out to her PCP for further evaluation.  Yvonne Kendall, MD [facility]

## 2023-01-02 NOTE — Telephone Encounter (Signed)
Spoke to patient and informed her of the provider's recommendations as follows:  "I do not typically prescribe muscle relaxants.  I recommend that Ms. Panther reach out to her PCP for further evaluation.   Yvonne Kendall, MD [facility]"  Patient understood with read back

## 2023-01-02 NOTE — Telephone Encounter (Signed)
Pt was wondering if Dr. Okey Dupre could call her in a muscle relaxer or inflammation pill because she thinks it could be something muscular skeletal in her chest. Please advise

## 2023-01-04 ENCOUNTER — Encounter: Payer: Self-pay | Admitting: Family

## 2023-01-04 ENCOUNTER — Ambulatory Visit (INDEPENDENT_AMBULATORY_CARE_PROVIDER_SITE_OTHER): Payer: BC Managed Care – PPO | Admitting: Family

## 2023-01-04 VITALS — BP 100/70 | HR 63 | Temp 98.2°F | Ht 70.0 in | Wt 125.0 lb

## 2023-01-04 DIAGNOSIS — R079 Chest pain, unspecified: Secondary | ICD-10-CM

## 2023-01-04 NOTE — Patient Instructions (Addendum)
Referral to [facility] physical therapy   Let us know if you dont hear back within a week in regards to an appointment being scheduled.   So that you are aware, if you are [facility] MyChart user , please pay attention to your MyChart messages as you may receive a MyChart message with a phone number to call and schedule this test/appointment own your own from our referral coordinator. This is a new process so I do not want you to miss this message.  If you are not a MyChart user, you will receive a phone call.    I have provided you with flexeril ( muscle relaxant) to take at bedtime  Do not drive or operate heavy machinery while on muscle relaxant. Please do not drink alcohol. Only take this medication as needed for acute muscle spasm at bedtime. This medication make you feel drowsy so be very careful.  Stop taking if become too drowsy or somnolent as this puts you at risk for falls. Please contact our office with any questions.    Let's start by scheduling Tylenol Arthritis which is a 650mg  tablet .   You may take 1-2 tablets every 8 hours ( scheduled) with maximum of 6 tablets per day. Most adults can safely take 4 pills total per day of Tylenol Arthritis 650mg  tablet. Do not exceed 6 tablets a day of Tylenol Arthritis 650mg  tablet   Please take two tablets in the morning ( 8am) every day for the next 2-3 weeks.   Maximum daily dose of acetaminophen 4 g per day from all sources.  If you are taking another medication which includes acetaminophen (Tylenol) which may be in cough and cold preparations or pain medication such as Percocet, you will need to factor that into your total daily dose to be safe.  Please let me know if any questions  A great article regarding how to safely take and dose tylenol found below.  Title : 'Acetaminophen safety: Be cautious but not afraid'  https://www.health.https://gentry.org/   Thoracic Strain Rehab Ask your health care  provider which exercises are safe for you. Do exercises exactly as told by your provider and adjust them as directed. It is normal to feel mild stretching, pulling, tightness, or discomfort as you do these exercises. Stop right away if you feel sudden pain or your pain gets worse. Do not begin these exercises until told by your provider. Stretching and range-of-motion exercise This exercise warms up your muscles and joints and improves the movement and flexibility of your back and shoulders. This exercise also helps to relieve pain. Chest and spine stretch  Lie down on your back on a firm surface. Roll a towel or a small blanket so it is about 4 inches (10 cm) in diameter. Put the towel under the middle of your back so it is under your spine, but not under your shoulder blades. Put your hands behind your head and let your elbows fall to your sides. This will increase your stretch. Take a deep breath (inhale). Hold for __________ seconds. Relax after you breathe out (exhale). Repeat __________ times. Complete this exercise __________ times a day. Strengthening exercises These exercises build strength and endurance in your back and your shoulder blade muscles. Endurance is the ability to use your muscles for a long time, even after they get tired. Alternating arm and leg raises  Get on your hands and knees on a firm surface. If you are on a hard floor, you may want to use  padding, such as an exercise mat, to cushion your knees. Line up your arms and legs. Your hands should be directly below your shoulders, and your knees should be directly below your hips. Lift your left leg behind you. At the same time, raise your right arm and straighten it in front of you. Do not lift your leg higher than your hip. Do not lift your arm higher than your shoulder. Keep your abdominal and back muscles tight. Keep your hips facing the ground. Do not arch your back. Carefully stay balanced. Do not hold your  breath. Hold for __________ seconds. Slowly return to the starting position and repeat with your right leg and your left arm. Repeat __________ times. Complete this exercise __________ times a day. Straight arm rows This exercise is also called the shoulder extension exercise. Stand with your feet shoulder width apart. Secure an exercise band to a stable object in front of you so the band is at or above shoulder height. Hold one end of the exercise band in each hand. Straighten your elbows and lift your hands up to shoulder height. Step back, away from the secured end of the exercise band, until the band stretches. Squeeze your shoulder blades together and pull your hands down to the sides of your thighs. Stop when your hands are straight down by your sides. This is shoulder extension. Do not let your hands go behind your body. Hold for __________ seconds. Slowly return to the starting position. Repeat __________ times. Complete this exercise __________ times a day. Rowing scapular retraction This is an exercise in which the shoulder blades (scapulae) are pulled toward each other (retraction). Sit in a stable chair without armrests, or stand up. Secure an exercise band to a stable object in front of you so the band is at shoulder height. Hold one end of the exercise band in each hand. Your palms should face toward each other. Bring your arms out straight in front of you. Step back, away from the secured end of the exercise band, until the band stretches. Pull the band backward. As you do this, bend your elbows and squeeze your shoulder blades together, but avoid letting the rest of your body move. Do not shrug your shoulders upward while you do this. Stop when your elbows are at your sides or slightly behind your body. Hold for __________ seconds. Slowly straighten your arms to return to the starting position. Repeat __________ times. Complete this exercise __________ times a day. Posture  and body mechanics Good posture and healthy body mechanics can help to relieve stress in your body's tissues and joints. Body mechanics refers to the movements and positions of your body while you do your daily activities. Posture is part of body mechanics. Good posture means: Your spine is in its natural S-curve position (neutral). Your shoulders are pulled back slightly. Your head is not tipped forward. Follow these guidelines to improve your posture and body mechanics in your everyday activities. Standing  When standing, keep your spine neutral and your feet about hip width apart. Keep a slight bend in your knees. Your ears, shoulders, and hips should line up with each other. When you do a task in which you lean forward while standing in one place for a long time, place one foot up on a stable object that is 2-4 inches (5-10 cm) high, such as a footstool. This helps keep your spine neutral. Sitting  When sitting, keep your spine neutral and keep your feet flat on  the floor. Use a footrest if needed. Keep your thighs parallel to the floor. Avoid rounding your shoulders, and avoid tilting your head forward. When working at a desk or a computer, keep your desk at a height where your hands are slightly lower than your elbows. Slide your chair under your desk so you are close enough to maintain good posture. When working at a computer, place your monitor at a height where you are looking straight ahead and you do not have to tilt your head forward or downward to look at the screen. Resting When lying down and resting, avoid positions that are most painful for you. If you have pain with activities such as sitting, bending, stooping, or squatting (flexion-basedactivities), lie in a position in which your body does not bend very much. For example, avoid curling up on your side with your arms and knees near your chest (fetal position). If you have pain with activities such as standing for a long time or  reaching with your arms (extension-basedactivities), lie with your spine in a neutral position and bend your knees slightly. Try the following positions: Lie on your side with a pillow between your knees. Lie on your back with a pillow under your knees.  Lifting  When lifting objects, keep your feet at least shoulder width apart and tighten your abdominal muscles. Bend your knees and hips and keep your spine neutral. It is important to lift using the strength of your legs, not your back. Do not lock your knees straight out. Always ask for help to lift heavy or awkward objects. This information is not intended to replace advice given to you by your health care provider. Make sure you discuss any questions you have with your health care provider. Document Revised: 11/21/2021 Document Reviewed: 11/21/2021 Elsevier Patient Education  2024 [employer].

## 2023-01-04 NOTE — Assessment & Plan Note (Addendum)
Extensive chart review, reviewed recent emergency room visit, prior cardiology consults.  Discussed with patient longstanding nature of pain.  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   Reviewing cardiology notes in regards to ischemic evaluation, making it less likely to be cardiac in nature at this time.  Chronicity of symptoms is also somewhat reassuring.  We discussed musculoskeletal etiology.  She has had periodic relief from ibuprofen however she has not stayed on therapy.  History of GI bleed, opted to start Tylenol arthritis and scheduled medication.  Advise muscle relaxant at bedtime.  Counseled on side effects, sedation of muscle relaxant.  Provided upper back strengthening exercises on after visit summary and also referred her to physical  therapy. close follow-up .she will keep follow up with Dr End in November as well.

## 2023-01-04 NOTE — Progress Notes (Signed)
Assessment & Plan:  Chest pain, unspecified type Assessment & Plan: Extensive chart review, reviewed recent emergency room visit, prior cardiology consults.  Discussed with patient longstanding nature of pain.  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   Reviewing cardiology notes in regards to ischemic evaluation, making it less likely to be cardiac in nature at this time.  Chronicity of symptoms is also somewhat reassuring.  We discussed musculoskeletal etiology.  She has had periodic relief from ibuprofen however she has not stayed on therapy.  History of GI bleed, opted to start Tylenol arthritis and scheduled medication.  Advise muscle relaxant at bedtime.  Counseled on side effects, sedation of muscle relaxant.  Provided upper back strengthening exercises on after visit summary and also referred her to physical  therapy. close follow-up .she will keep follow up with Dr End in November as well.  Orders: -     Ambulatory referral to Physical Therapy    I have spent 25 minutes with a patient including precharting, exam, reviewing medical records, and discussion plan of care.     Return precautions given.   Risks, benefits, and alternatives of the medications and treatment plan prescribed today were discussed, and patient expressed understanding.   Education regarding symptom management and diagnosis given to patient on AVS either electronically or printed.  No follow-ups on file.  Rennie Plowman, FNP  Subjective:    Patient ID: Cristina Wade, female    DOB: [birth year], 52 y.o.   MRN: [id number]  CC: Cristina Wade is a 52 y.o. female who presents today for an acute visit.    HPI: Complains of chest for pain 2-3 years, unchanged.   She states SOB has resolved from prior visit.   She continues to describe 'when gets in hurry' she will feel central and left sided  chest 'dull and aching' discomfort.   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   Walking daily for 30 minutes daily and there are times when she 'walks too fast' ; this doesn't occur when she walks a lower pace.   When she stops the activity, the pain will resolve, however it may take 4-5 hours to resolve.   Pain can happen daily.   No associated dyspnea, left arm numbness, neck pain, upper back pain, N, diaphoresis, HA.   No relief with ranolazine so she stopped medicaiton.   Heat at night has been helpful at night.  She took ibuprofen prn, not scheduled however she does report she had intermittent relief.      Started on ranolazine by Dr End 02/02/22    Compliant with protonix 40mg  BID. H/o GIB.  patient called Dr. Serita Kyle office in regards to her muscle relaxant as invasive chest pain.  He asked her to call her primary care.  Presented to the emergency room 12/06/2022 for for chest pressure. Chest x-ray with no acute findings, troponin x 2 negative.  Discharged on ibuprofen 600mg   every 8 hours,cefdinir 300mg BID, and zofran  Urinalysis trace blood Urine culture 12/01/2022, e coli  Seen by cardiology 03/06/2022 for chronic chest pain  Exercise tolerance test on 10/13/2019 revealed low risk study without evidence of ischemia   Consult Dr. In 02/02/2022 and started on ranolazine trial . Ordered echo with bubble  study.   Echocardiogram completed 02/23/2022 revealed LVEF of 60 to 65%, no regional wall motion abnormalities, G2 DD, mild mitral regurgitation, bubble study portion of the test was negative with no evidence of any intra atrial shunting  Presented emergency room 01/25/2022 for chest pain.  PET scan 09/25/2022 No findings suspicious for malignancy   Allergies: Milk (cow), Milk-related compounds, Omeprazole, and Penicillins Current Outpatient Medications  on File Prior to Visit  Medication Sig Dispense Refill   cholecalciferol (VITAMIN D3) 10 MCG (400 UNIT) TABS tablet Take 400 Units by mouth daily.     Multiple Vitamin (MULTIVITAMIN ADULT PO) Take 1 tablet by mouth daily.     pantoprazole (PROTONIX) 40 MG tablet Take 1 tablet (40 mg total) by mouth 2 (two) times daily before a meal. 60 tablet 1   polyethylene glycol (MIRALAX / GLYCOLAX) 17 g packet Take 17 g by mouth daily.     Probiotic Product (PROBIOTIC DAILY PO) Take 1 tablet by mouth daily.     psyllium (REGULOID) 0.52 g capsule Take 0.52 g by mouth daily.     tamsulosin (FLOMAX) 0.4 MG CAPS capsule Take 1 capsule by mouth once daily 30 capsule 0   ZINC OXIDE PO Take 1 tablet by mouth daily.     Calcium Carbonate-Vit D-Min (CALCIUM 1200 PO) Take 2-3 tablets by mouth daily. Reported on 05/26/2015     ondansetron (ZOFRAN-ODT) 4 MG disintegrating tablet Take 1 tablet (4 mg total) by mouth every 8 (eight) hours as needed for nausea or vomiting. 20 tablet 0   Current Facility-Administered Medications on File Prior to Visit  Medication Dose Route Frequency Provider Last Rate Last Admin   lidocaine (XYLOCAINE) 2 % jelly 1 application  1 application  Urethral Once Sondra Come, MD        Review of Systems  Constitutional:  Negative for chills and fever.  Respiratory:  Negative for cough and shortness of breath.   Cardiovascular:  Positive for chest pain. Negative for palpitations and leg swelling.  Gastrointestinal:  Negative for nausea and vomiting.  Musculoskeletal:  Negative for back pain and neck pain.      Objective:    BP 100/70   Pulse 63   Temp 98.2 F (36.8 C) (Oral)   Ht 5\' 10"  (1.778 m)   Wt 125 lb (56.7 kg)   LMP 11/03/2021 (Exact Date)   SpO2 98%   BMI 17.94 kg/m   BP Readings from Last 3 Encounters:  01/04/23 100/70  12/12/22 114/68  12/06/22 120/71   Wt Readings from Last 3 Encounters:  01/04/23 125 lb (56.7 kg)  12/12/22 123 lb (55.8 kg)  12/06/22 123 lb  (55.8 kg)    Physical Exam Vitals reviewed.  Constitutional:      Appearance: She is well-developed.  Eyes:     Conjunctiva/sclera: Conjunctivae normal.  Cardiovascular:     Rate and Rhythm: Normal rate and regular rhythm.     Pulses: Normal pulses.     Heart sounds: Normal heart sounds.  Pulmonary:     Effort: Pulmonary effort is normal.     Breath sounds: Normal breath sounds. No wheezing, rhonchi or rales.  Chest:       Comments: Chest pain not reproducible on exam today.  No chest pain with shoulder movement, range of motion exercises including internal/external rotation of bilateral shoulders or with lateral arm raises Skin:    General: Skin is warm and dry.  Neurological:     Mental Status: She is  alert.  Psychiatric:        Speech: Speech normal.        Behavior: Behavior normal.        Thought Content: Thought content normal.

## 2023-01-08 NOTE — Progress Notes (Unsigned)
01/09/23 2:25 PM   Cristina Wade 16-Jul-1970 [id number]  Referring provider:  [name], FNP [address] [place],  [place] [address]  Urological history  1. High risk hematuria -non-smoker -work up 2015 -bilateral punctate stones -cysto 2019 NED -non-contrast CT 2022 - bilateral punctate stones -cysto, 2022 - NED -RUS (08/2022) - bilateral renal stones -PET scan (09/2022) - NED   2. Neurogenic bladder -UDS 2017 indicating nonobstructive urinary retention with poor bladder sensation and external sphincter dyssynergia -completed 12 weeks of PTNS -completed pelvic floor PT -manages with self cathing three times daily   3. Nephrolithiasis -non-contrast CT 2022 2 mm stone in the right kidney lower pole without hydronephrosis.  Three tiny stones in left kidney lower pole region without hydronephrosis   4. rUTI's -contributing factors of age, incomplete emptying, vaginal atrophy, chronic constipation,  -documented urine cultures over the last year  December 01, 2022 - E.coli  October 11, 2022 - Citrobacter koseri   Aug 22, 2022 -E.coli  August 11, 2022 - E. Coli  March 21, 2022 - MUF  April 12, 2022 - Citrobacter species  January 17, 2022 - E. Coli  November 03, 2022 - no growth           10/21/2021-Enterococcus faecalis  11/02/2021 No Growth  01/17/2022 E.coli   Chief Complaint  Patient presents with   Results    HPI: Cristina Wade is a 52 y.o.female who presents today for CT report.   Previous records reviewed.   At her visit on 12/12/2022, She has been having instances when she does not even feel like she has a bowel movement when she goes to wipe she has stool on the toilet paper.  She states this has been going on for weeks.  She states that she has bowel movements daily.  She also states she has been having gross heme on and off and having more difficulty passing her catheter.  She is cathing 3 times daily with PVR is approximately 300 cc.  Patient denies  any modifying or aggravating factors.  Patient denies any recent UTI's, dysuria or suprapubic/flank pain.  Patient denies any fevers, chills, nausea or vomiting.   CATH UA yellow clear, specific gravity 1.010, trace blood, pH 7.0, 0-5 WBCs, 0-2 RBCs, 0-10 epithelial cells.   KUB significant stool burden.     When discussing things with her further, her difficulty with self cathing is she sometimes misses her urethral meatus.  When she does this she goes and gets a fresh new catheter.  She is no longer seeing blood in the urine.  She also states the stool incontinence has decreased.  She feels it may have been due to her not cleaning herself well after a bowel movement.  Patient denies any modifying or aggravating factors.  Patient denies any recent UTI's, gross hematuria, dysuria or suprapubic/flank pain.  Patient denies any fevers, chills, nausea or vomiting.    CT urogram (12/2022) -no nephrolithiasis, no hydronephrosis and no renal masses.  Significant for constipation.    PMH: Past Medical History:  Diagnosis Date   Anxiety    Asthma    Chronic pelvic pain in female 2018   Cystitis    Cystocele    Endometrial polyp    Endometriosis    per pt report but never seen during surg   Frequent headaches    Gastritis    GERD (gastroesophageal reflux disease)    rare   Gross hematuria    History of kidney  stones    Microscopic hematuria    Migraine    Migraines    Neuropathy    Osteopenia    Urinary disorder    UTI (lower urinary tract infection)     Surgical History: Past Surgical History:  Procedure Laterality Date   COLONOSCOPY N/A 11/05/2014   Procedure: COLONOSCOPY;  Surgeon: Scot Jun, MD;  Location: [facility] ENDOSCOPY;  Service: Endoscopy;  Laterality: N/A;   COLONOSCOPY  11/2016   Dr. Mechele Collin   CYSTOSCOPY  2007   with biopsy    CYSTOSCOPY N/A 06/01/2016   Procedure: CYSTOSCOPY;  Surgeon: Vena Austria, MD;  Location: [facility] ORS;  Service: Gynecology;  Laterality:  N/A;   DIAGNOSTIC LAPAROSCOPY     DILATATION & CURETTAGE/HYSTEROSCOPY WITH MYOSURE N/A 04/13/2015   Procedure: DILATATION & CURETTAGE/HYSTEROSCOPY WITH MYOSURE/POLYPECTOMY;  Surgeon: Elenora Fender Ward, MD;  Location: [facility] ORS;  Service: Gynecology;  Laterality: N/A;   DILATATION & CURETTAGE/HYSTEROSCOPY WITH MYOSURE N/A 06/01/2016   Procedure: DILATATION & CURETTAGE/HYSTEROSCOPY WITH MYOSURE;  Surgeon: Vena Austria, MD;  Location: [facility] ORS;  Service: Gynecology;  Laterality: N/A;   DILATION AND CURETTAGE OF UTERUS     ESOPHAGOGASTRODUODENOSCOPY (EGD) WITH PROPOFOL N/A 08/31/2022   Procedure: ESOPHAGOGASTRODUODENOSCOPY (EGD) WITH PROPOFOL;  Surgeon: Jaynie Collins, DO;  Location: [facility] ENDOSCOPY;  Service: Gastroenterology;  Laterality: N/A;   GUM SURGERY     laproscopy  2007   POLYPECTOMY     endometrial    Home Medications:  Allergies as of 01/09/2023       Reactions   Milk (cow) Other (See Comments)   Milk-related Compounds    Omeprazole Other (See Comments)   Pt reports "Chest pain, HA and Dysuria." 01/05/22 patient states she is able to take omeprazole now without a reaction   Penicillins Itching, Rash        Medication List        Accurate as of January 09, 2023  2:25 PM. If you have any questions, ask your nurse or doctor.          CALCIUM 1200 PO Take 2-3 tablets by mouth daily. Reported on 05/26/2015   cholecalciferol 10 MCG (400 UNIT) Tabs tablet Commonly known as: VITAMIN D3 Take 400 Units by mouth daily.   MULTIVITAMIN ADULT PO Take 1 tablet by mouth daily.   ondansetron 4 MG disintegrating tablet Commonly known as: ZOFRAN-ODT Take 1 tablet (4 mg total) by mouth every 8 (eight) hours as needed for nausea or vomiting.   pantoprazole 40 MG tablet Commonly known as: PROTONIX Take 1 tablet (40 mg total) by mouth 2 (two) times daily before a meal.   polyethylene glycol 17 g packet Commonly known as: MIRALAX / GLYCOLAX Take 17 g by mouth daily.    PROBIOTIC DAILY PO Take 1 tablet by mouth daily.   psyllium 0.52 g capsule Commonly known as: REGULOID Take 0.52 g by mouth daily.   tamsulosin 0.4 MG Caps capsule Commonly known as: FLOMAX Take 1 capsule by mouth once daily   ZINC OXIDE PO Take 1 tablet by mouth daily.        Allergies:  Allergies  Allergen Reactions   Milk (Cow) Other (See Comments)   Milk-Related Compounds    Omeprazole Other (See Comments)    Pt reports "Chest pain, HA and Dysuria." 01/05/22 patient states she is able to take omeprazole now without a reaction   Penicillins Itching and Rash    Family History: Family History  Problem Relation Age of Onset  Arthritis Mother    Heart disease Mother    Stroke Mother        TIA   Hypertension Mother    Diabetes Mother    Kidney cancer Mother 49   Heart failure Mother    Hyperlipidemia Mother    Colon cancer Father 37   Arthritis Father    Hyperlipidemia Father    Transient ischemic attack Father    Cancer - Colon Father 58   Prostate cancer Father 31   Valvular heart disease Father    Cancer Maternal Grandmother    Cancer Paternal Grandfather        prostate   Breast cancer Cousin 12       has contact    Social History:  reports that she has never smoked. She has never been exposed to tobacco smoke. She has never used smokeless tobacco. She reports that she does not drink alcohol and does not use drugs.   Physical Exam: BP 128/70   Pulse 74   Ht 5\' 8"  (1.727 m)   Wt 125 lb (56.7 kg)   LMP 11/03/2021 (Exact Date)   BMI 19.01 kg/m   Constitutional:  Well nourished. Alert and oriented, No acute distress. HEENT: [place] AT, moist mucus membranes.  Trachea midline Cardiovascular: No clubbing, cyanosis, or edema. Respiratory: Normal respiratory effort, no increased work of breathing. Neurologic: Grossly intact, no focal deficits, moving all 4 extremities. Psychiatric: Normal mood and affect.    Laboratory Data: N/A  Pertinent  imaging N/A  Assessment & Plan:    1. High risk hematuria -non-smoker -work up x 2 - NED -reports of gross heme - CT urogram w/o worrisome GU findings -the gross heme may be due to vaginal or urethral irritation, will continue to monitor  2. Neurogenic bladder  -continue CIC x 3 daily  -She is going through a lot of catheters due to her using a fresh 1 when she misses the urethral meatus -She will start catheterizing sitting down so that she can use a mirror to target the urethral meatus and she will let us know if this helps  3. Stool incontinence -refer back to GI for further evaluation -pending   Cloretta Ned   [facility] [address] [place], [place] [address] [phone]

## 2023-01-09 ENCOUNTER — Ambulatory Visit: Payer: BC Managed Care – PPO | Admitting: Urology

## 2023-01-09 ENCOUNTER — Encounter: Payer: Self-pay | Admitting: Urology

## 2023-01-09 VITALS — BP 128/70 | HR 74 | Ht 68.0 in | Wt 125.0 lb

## 2023-01-09 DIAGNOSIS — R310 Gross hematuria: Secondary | ICD-10-CM | POA: Diagnosis not present

## 2023-01-09 DIAGNOSIS — N390 Urinary tract infection, site not specified: Secondary | ICD-10-CM

## 2023-01-09 DIAGNOSIS — N319 Neuromuscular dysfunction of bladder, unspecified: Secondary | ICD-10-CM | POA: Diagnosis not present

## 2023-01-09 DIAGNOSIS — N200 Calculus of kidney: Secondary | ICD-10-CM

## 2023-01-12 ENCOUNTER — Encounter: Payer: Self-pay | Admitting: Family

## 2023-01-14 ENCOUNTER — Other Ambulatory Visit: Payer: Self-pay | Admitting: Family

## 2023-01-14 MED ORDER — CYCLOBENZAPRINE HCL 10 MG PO TABS: 5.0000 mg | ORAL_TABLET | Freq: Every evening | ORAL | 2 refills | Status: DC | PRN

## 2023-01-17 ENCOUNTER — Other Ambulatory Visit: Payer: Self-pay | Admitting: *Deleted

## 2023-01-17 MED ORDER — TAMSULOSIN HCL 0.4 MG PO CAPS: 0.4000 mg | ORAL_CAPSULE | Freq: Every day | ORAL | 0 refills | Status: DC

## 2023-01-22 ENCOUNTER — Ambulatory Visit: Payer: BC Managed Care – PPO | Admitting: Physician Assistant

## 2023-01-25 NOTE — Telephone Encounter (Signed)
Patient called and left a message about getting scheduled. Do not see an opening on the schedule next week. Can patient be worked in one of the days next week?

## 2023-02-09 NOTE — Progress Notes (Deleted)
02/09/23 11:45 AM   Cristina Wade 1971-01-12 [id number]  Referring provider:  [name], FNP [address] [place],  [place] [address]  Urological history  1. High risk hematuria -non-smoker -work up 2015 -bilateral punctate stones -cysto 2019 NED -non-contrast CT 2022 - bilateral punctate stones -cysto, 2022 - NED -RUS (08/2022) - bilateral renal stones -PET scan (09/2022) - NED -CT urogram (12/2022) - bilateral renal stones    2. Neurogenic bladder -UDS 2017 indicating nonobstructive urinary retention with poor bladder sensation and external sphincter dyssynergia -completed 12 weeks of PTNS -completed pelvic floor PT -manages with self cathing three times daily   3. Nephrolithiasis -non-contrast CT 2022 2 mm stone in the right kidney lower pole without hydronephrosis.  Three tiny stones in left kidney lower pole region without hydronephrosis -ct urogram (12/2022) - bilateral renal stones    4. rUTI's -contributing factors of age, incomplete emptying, vaginal atrophy, chronic constipation,  -documented urine cultures over the last year  December 01, 2022 - E.coli  November 03, 2022 - no growth  October 11, 2022 - Citrobacter koseri   Aug 22, 2022 -E.coli  August 11, 2022 - E. Coli  April 12, 2022 - Citrobacter species  March 21, 2022 - MUF              No chief complaint on file.   HPI: Cristina Wade is a 52 y.o.female who presents today for UTI.    Previous records reviewed.   UA ***  PVR ***  PMH: Past Medical History:  Diagnosis Date   Anxiety    Asthma    Chronic pelvic pain in female 2018   Cystitis    Cystocele    Endometrial polyp    Endometriosis    per pt report but never seen during surg   Frequent headaches    Gastritis    GERD (gastroesophageal reflux disease)    rare   Gross hematuria    History of kidney stones    Microscopic hematuria    Migraine    Migraines    Neuropathy    Osteopenia    Urinary disorder    UTI  (lower urinary tract infection)     Surgical History: Past Surgical History:  Procedure Laterality Date   COLONOSCOPY N/A 11/05/2014   Procedure: COLONOSCOPY;  Surgeon: Scot Jun, MD;  Location: [facility] ENDOSCOPY;  Service: Endoscopy;  Laterality: N/A;   COLONOSCOPY  11/2016   Dr. Mechele Collin   CYSTOSCOPY  2007   with biopsy    CYSTOSCOPY N/A 06/01/2016   Procedure: CYSTOSCOPY;  Surgeon: Vena Austria, MD;  Location: [facility] ORS;  Service: Gynecology;  Laterality: N/A;   DIAGNOSTIC LAPAROSCOPY     DILATATION & CURETTAGE/HYSTEROSCOPY WITH MYOSURE N/A 04/13/2015   Procedure: DILATATION & CURETTAGE/HYSTEROSCOPY WITH MYOSURE/POLYPECTOMY;  Surgeon: Elenora Fender Ward, MD;  Location: [facility] ORS;  Service: Gynecology;  Laterality: N/A;   DILATATION & CURETTAGE/HYSTEROSCOPY WITH MYOSURE N/A 06/01/2016   Procedure: DILATATION & CURETTAGE/HYSTEROSCOPY WITH MYOSURE;  Surgeon: Vena Austria, MD;  Location: [facility] ORS;  Service: Gynecology;  Laterality: N/A;   DILATION AND CURETTAGE OF UTERUS     ESOPHAGOGASTRODUODENOSCOPY (EGD) WITH PROPOFOL N/A 08/31/2022   Procedure: ESOPHAGOGASTRODUODENOSCOPY (EGD) WITH PROPOFOL;  Surgeon: Jaynie Collins, DO;  Location: [facility] ENDOSCOPY;  Service: Gastroenterology;  Laterality: N/A;   GUM SURGERY     laproscopy  2007   POLYPECTOMY     endometrial    Home Medications:  Allergies as of 02/14/2023  Reactions   Milk (cow) Other (See Comments)   Milk-related Compounds    Omeprazole Other (See Comments)   Pt reports "Chest pain, HA and Dysuria." 01/05/22 patient states she is able to take omeprazole now without a reaction   Penicillins Itching, Rash        Medication List        Accurate as of February 09, 2023 11:45 AM. If you have any questions, ask your nurse or doctor.          CALCIUM 1200 PO Take 2-3 tablets by mouth daily. Reported on 05/26/2015   cholecalciferol 10 MCG (400 UNIT) Tabs tablet Commonly known as: VITAMIN D3 Take 400  Units by mouth daily.   cyclobenzaprine 10 MG tablet Commonly known as: FLEXERIL Take 0.5-1 tablets (5-10 mg total) by mouth at bedtime as needed for muscle spasms.   MULTIVITAMIN ADULT PO Take 1 tablet by mouth daily.   ondansetron 4 MG disintegrating tablet Commonly known as: ZOFRAN-ODT Take 1 tablet (4 mg total) by mouth every 8 (eight) hours as needed for nausea or vomiting.   pantoprazole 40 MG tablet Commonly known as: PROTONIX Take 1 tablet (40 mg total) by mouth 2 (two) times daily before a meal.   polyethylene glycol 17 g packet Commonly known as: MIRALAX / GLYCOLAX Take 17 g by mouth daily.   PROBIOTIC DAILY PO Take 1 tablet by mouth daily.   psyllium 0.52 g capsule Commonly known as: REGULOID Take 0.52 g by mouth daily.   tamsulosin 0.4 MG Caps capsule Commonly known as: FLOMAX Take 1 capsule (0.4 mg total) by mouth daily.   ZINC OXIDE PO Take 1 tablet by mouth daily.        Allergies:  Allergies  Allergen Reactions   Milk (Cow) Other (See Comments)   Milk-Related Compounds    Omeprazole Other (See Comments)    Pt reports "Chest pain, HA and Dysuria." 01/05/22 patient states she is able to take omeprazole now without a reaction   Penicillins Itching and Rash    Family History: Family History  Problem Relation Age of Onset   Arthritis Mother    Heart disease Mother    Stroke Mother        TIA   Hypertension Mother    Diabetes Mother    Kidney cancer Mother 68   Heart failure Mother    Hyperlipidemia Mother    Colon cancer Father 34   Arthritis Father    Hyperlipidemia Father    Transient ischemic attack Father    Cancer - Colon Father 45   Prostate cancer Father 6   Valvular heart disease Father    Cancer Maternal Grandmother    Cancer Paternal Grandfather        prostate   Breast cancer Cousin 41       has contact    Social History:  reports that she has never smoked. She has never been exposed to tobacco smoke. She has never used  smokeless tobacco. She reports that she does not drink alcohol and does not use drugs.   Physical Exam: LMP 11/03/2021 (Exact Date)   Constitutional:  Well nourished. Alert and oriented, No acute distress. HEENT: [place] AT, moist mucus membranes.  Trachea midline, no masses. Cardiovascular: No clubbing, cyanosis, or edema. Respiratory: Normal respiratory effort, no increased work of breathing. GU: No CVA tenderness.  No bladder fullness or masses.  Recession of labia minora, dry, pale vulvar vaginal mucosa and loss of mucosal ridges and  folds.  Normal urethral meatus, no lesions, no prolapse, no discharge.   No urethral masses, tenderness and/or tenderness. No bladder fullness, tenderness or masses. *** vagina mucosa, *** estrogen effect, no discharge, no lesions, *** pelvic support, *** cystocele and *** rectocele noted.  No cervical motion tenderness.  Uterus is freely mobile and non-fixed.  No adnexal/parametria masses or tenderness noted.  Anus and perineum are without rashes or lesions.   ***  Neurologic: Grossly intact, no focal deficits, moving all 4 extremities. Psychiatric: Normal mood and affect.    Laboratory Data: Urinalysis See HPI and EPIC I have reviewed the labs.  See HPI.     Pertinent imaging ***  Assessment & Plan:    1. High risk hematuria -non-smoker -work up x 2 - NED -reports of gross heme - CT urogram w/o worrisome GU findings -the gross heme may be due to vaginal or urethral irritation, will continue to monitor  2. Neurogenic bladder  -continue CIC x 3 daily  -She is going through a lot of catheters due to her using a fresh 1 when she misses the urethral meatus -She will start catheterizing sitting down so that she can use a mirror to target the urethral meatus and she will let us know if this helps  3. Stool incontinence -refer back to GI for further evaluation -pending   Cloretta Ned   [facility] [address]  [address] [place], [place] [address] [phone]

## 2023-02-14 ENCOUNTER — Ambulatory Visit: Payer: BC Managed Care – PPO | Admitting: Urology

## 2023-02-14 ENCOUNTER — Other Ambulatory Visit: Payer: Self-pay | Admitting: Urology

## 2023-02-14 DIAGNOSIS — N319 Neuromuscular dysfunction of bladder, unspecified: Secondary | ICD-10-CM

## 2023-02-14 DIAGNOSIS — R319 Hematuria, unspecified: Secondary | ICD-10-CM

## 2023-02-14 DIAGNOSIS — R159 Full incontinence of feces: Secondary | ICD-10-CM

## 2023-02-14 MED ORDER — TAMSULOSIN HCL 0.4 MG PO CAPS: 0.4000 mg | ORAL_CAPSULE | Freq: Every day | ORAL | 3 refills | Status: DC

## 2023-03-07 ENCOUNTER — Encounter: Payer: Self-pay | Admitting: Internal Medicine

## 2023-03-07 ENCOUNTER — Ambulatory Visit: Payer: BC Managed Care – PPO | Attending: Internal Medicine | Admitting: Internal Medicine

## 2023-03-07 VITALS — BP 110/79 | HR 62 | Ht 70.0 in | Wt 122.2 lb

## 2023-03-07 DIAGNOSIS — R079 Chest pain, unspecified: Secondary | ICD-10-CM

## 2023-03-07 DIAGNOSIS — G8929 Other chronic pain: Secondary | ICD-10-CM | POA: Diagnosis not present

## 2023-03-07 DIAGNOSIS — R002 Palpitations: Secondary | ICD-10-CM

## 2023-03-07 DIAGNOSIS — I4510 Unspecified right bundle-branch block: Secondary | ICD-10-CM

## 2023-03-07 NOTE — Progress Notes (Unsigned)
Cardiology Office Note:  .   Date:  03/08/2023  ID:  [name], DOB 06-15-1970, MRN [id number] PCP: [name], FNP  [facility] Providers Cardiologist:  Yvonne Kendall, MD     History of Present Illness: .   Cristina Wade is a 52 y.o. female with history of recurrent chest pain with normal coronaries by CTA, asthma, GERD, and migraine headaches, who presents for follow-up of chest pain.  She was last seen a year ago by Charlsie Quest, NP, at which time she was doing fairly well with improved chest pain on ranolazine.  No medication changes or additional testing were pursued.  She subsequently stopped ranolazine on her own as she felt like it wasn't providing her any relief.  Today comes today reports that she continues to have sporadic left-sided chest pain.  It seems to happen most commonly when she exerts herself.  She describes it as a dull ache.  Overall, it is similar to prior visits.  She wonders if it could be musculoskeletal.  She denies shortness of breath.  She has experienced occasional palpitations over the last year during which it feels like her heart races for up to 5 minutes.  Episodes happen every 2 to 3 days.  There are no associated symptoms.  She denies lightheadedness and edema.  ROS: See HPI  Studies Reviewed: Marland Kitchen   EKG Interpretation Date/Time:  Wednesday March 07 2023 16:02:27 EST Ventricular Rate:  62 PR Interval:  142 QRS Duration:  102 QT Interval:  452 QTC Calculation: 458 R Axis:   90  Text Interpretation: Normal sinus rhythm Rightward axis Incomplete right bundle branch block When compared with ECG of 06-Dec-2022 05:07, No significant change was found Confirmed by Bora Bost, Cristal Deer [phone]) on 03/08/2023 7:45:45 AM    Risk Assessment/Calculations:             Physical Exam:   VS:  BP 110/79 (BP Location: Left Arm, Patient Position: Sitting, Cuff Size: Normal)   Pulse 62   Ht 5\' 10"  (1.778 m)   Wt 122 lb 3.2 oz (55.4 kg)   LMP  11/03/2021 (Exact Date)   SpO2 99%   BMI 17.53 kg/m    Wt Readings from Last 3 Encounters:  03/07/23 122 lb 3.2 oz (55.4 kg)  01/09/23 125 lb (56.7 kg)  01/04/23 125 lb (56.7 kg)    General:  NAD. Neck: No JVD or HJR. Lungs: Clear to auscultation bilaterally without wheezes or crackles. Heart: Regular rate and rhythm without murmurs, rubs, or gallops. Abdomen: Soft, nontender, nondistended. Extremities: No lower extremity edema.  ASSESSMENT AND PLAN: .    Chronic chest pain: As stated Long history of intermittent chest pain.  Overall, it seems to be stable in frequency and severity compared to prior visits.  Though she has an exertional component concerning for angina, prior coronary CTA showed absence of CAD.  She was tried on ranolazine for treatment of possible microvascular dysfunction without any improvement.  We discussed empiric trial of another agent such as a beta-blocker, but have agreed to defer this given her baseline low normal heart rate and blood pressure.  I have encouraged her to try ibuprofen 400 mg 3 times daily for the next week to see if this offers some relief.  She should continue taking pantoprazole.  Palpitations: Episodes happen every 2 to 3 days and are without worrisome symptoms.  However, the symptom seems to be new over the last year.  We discussed wearing an event monitor  but have agreed to defer this pending trial of ibuprofen for aforementioned chest pain.  If she continues to have symptoms, we will arrange for a 14-day event monitor (ZIO XT).  Most recent TSH in May was normal.    Dispo: Return to clinic in 3 months.  Signed, Yvonne Kendall, MD

## 2023-03-07 NOTE — Patient Instructions (Signed)
Medication Instructions:  Your physician recommends the following medication changes.  START TAKING: Ibuprofen 400 mg by mouth three times a day for 1 week (May purchase over the counter)  *If you need a refill on your cardiac medications before your next appointment, please call your pharmacy*   Lab Work: No labs ordered today    Testing/Procedures: No test ordered today    Follow-Up: At [facility], you and your health needs are our priority.  As part of our continuing mission to provide you with exceptional heart care, we have created designated Provider Care Teams.  These Care Teams include your primary Cardiologist (physician) and Advanced Practice Providers (APPs -  Physician Assistants and Nurse Practitioners) who all work together to provide you with the care you need, when you need it.  We recommend signing up for the patient portal called "MyChart".  Sign up information is provided on this After Visit Summary.  MyChart is used to connect with patients for Virtual Visits (Telemedicine).  Patients are able to view lab/test results, encounter notes, upcoming appointments, etc.  Non-urgent messages can be sent to your provider as well.   To learn more about what you can do with MyChart, go to [email or web address].    Your next appointment:   3 month(s)  Provider:   You may see Yvonne Kendall, MD or one of the following Advanced Practice Providers on your designated Care Team:   Nicolasa Ducking, NP Eula Listen, PA-C Cadence Fransico Michael, PA-C Charlsie Quest, NP Carlos Levering, NP

## 2023-03-08 ENCOUNTER — Encounter: Payer: Self-pay | Admitting: Internal Medicine

## 2023-03-08 DIAGNOSIS — R002 Palpitations: Secondary | ICD-10-CM | POA: Insufficient documentation

## 2023-03-12 ENCOUNTER — Ambulatory Visit: Payer: BC Managed Care – PPO

## 2023-03-14 ENCOUNTER — Ambulatory Visit: Payer: BC Managed Care – PPO

## 2023-03-21 ENCOUNTER — Telehealth: Payer: Self-pay | Admitting: Internal Medicine

## 2023-03-21 DIAGNOSIS — R002 Palpitations: Secondary | ICD-10-CM

## 2023-03-21 NOTE — Telephone Encounter (Signed)
Patient was calling to see when she can come to pick up her heart monitor

## 2023-03-22 NOTE — Telephone Encounter (Signed)
At our recent visit, we agreed to try a course of ibuprofen to see if that would help her symptoms before moving ahead with an event monitor.  Given her request for an event monitor, I presume that her symptoms have not abated.  If that is the case, please arrange for her to wear a 14-day event monitor (ZIO-XT) for assessment of palpitations and chest pain.  Yvonne Kendall, MD [facility]

## 2023-03-22 NOTE — Telephone Encounter (Signed)
Pt called back for clarification on taking IBU and Zio - see Dr End note.  L[facility]

## 2023-03-27 NOTE — Telephone Encounter (Signed)
Heart monitor order placed Left a message for the patient to call back.

## 2023-03-27 NOTE — Telephone Encounter (Signed)
Patient called to follow-up on getting heart monitor.

## 2023-03-28 NOTE — Telephone Encounter (Signed)
Pt returning call

## 2023-03-28 NOTE — Telephone Encounter (Signed)
Spoke to patient and informed her that the ZIO patch heart monitor has been ordered and it will be mailed out to her home in 3 - 5 business days. Sent patient ZIO instructions via MyChart as requested. Patient understood with read back

## 2023-03-29 ENCOUNTER — Encounter: Payer: BC Managed Care – PPO | Admitting: Physical Therapy

## 2023-04-03 ENCOUNTER — Other Ambulatory Visit: Payer: Self-pay

## 2023-04-03 ENCOUNTER — Ambulatory Visit: Payer: BC Managed Care – PPO | Attending: Internal Medicine

## 2023-04-03 ENCOUNTER — Telehealth: Payer: Self-pay | Admitting: Internal Medicine

## 2023-04-03 DIAGNOSIS — R002 Palpitations: Secondary | ICD-10-CM

## 2023-04-03 NOTE — Telephone Encounter (Signed)
New order placed for Zio and responded via MyChart

## 2023-04-03 NOTE — Telephone Encounter (Signed)
Patient is calling to see about heart monitor that was ordered. She states she hasn't received it yet, but will be going out of town on Thursday. Requesting call back for update.

## 2023-04-17 ENCOUNTER — Ambulatory Visit: Payer: BC Managed Care – PPO

## 2023-04-17 ENCOUNTER — Ambulatory Visit (INDEPENDENT_AMBULATORY_CARE_PROVIDER_SITE_OTHER): Payer: BC Managed Care – PPO | Admitting: Family

## 2023-04-17 ENCOUNTER — Encounter: Payer: Self-pay | Admitting: Family

## 2023-04-17 VITALS — BP 132/76 | Temp 97.7°F | Ht 70.0 in | Wt 126.2 lb

## 2023-04-17 DIAGNOSIS — J40 Bronchitis, not specified as acute or chronic: Secondary | ICD-10-CM | POA: Diagnosis not present

## 2023-04-17 DIAGNOSIS — R051 Acute cough: Secondary | ICD-10-CM | POA: Diagnosis not present

## 2023-04-17 LAB — POCT INFLUENZA A/B
Influenza A, POC: NEGATIVE
Influenza B, POC: NEGATIVE

## 2023-04-17 LAB — POC COVID19 BINAXNOW: SARS Coronavirus 2 Ag: NEGATIVE

## 2023-04-17 NOTE — Patient Instructions (Addendum)
Start mucinex DM  Stop sudafed  Please let me know if symptoms do not improve, I suspect you have a viral respiratory infection

## 2023-04-17 NOTE — Progress Notes (Signed)
 Assessment & Plan:  Acute cough -     POC COVID-19 BinaxNow -     POCT Influenza A/B  Bronchitis Assessment & Plan: Afebrile and without acute respiratory distress.  Duration 4 days.  Advised more likely viral URI.  Advised to stop Sudafed as suspect may be aggravating nasal dryness.  Advised continued use of dehumidifier and to start Mucinex  DM as needed.  She will let me know if symptoms do not improve.      Return precautions given.   Risks, benefits, and alternatives of the medications and treatment plan prescribed today were discussed, and patient expressed understanding.   Education regarding symptom management and diagnosis given to patient on AVS either electronically or printed.  Return if symptoms worsen or fail to improve.  Cristina Northern, FNP  Subjective:    Patient ID: Cristina Wade, female    DOB: 1970/07/05, 52 y.o.   MRN: [id number]  CC: Cristina Wade is a 52 y.o. female who presents today for an acute visit.    HPI: Complains of dry cough x 4 days, waxes and waned.   Endorses sinus pressure over her eyes, ears feeling full.    She had been blowing nose and feels nose is 'dry'. She is using a dehumidifier with some improvement. Unable to blow nose. She doesn't use a nasal spray.   Taking sudafed prn however feels that it is helping.   No fever, sob, chest pain.       Allergies: Milk (cow), Milk-related compounds, Omeprazole , and Penicillins Current Outpatient Medications on File Prior to Visit  Medication Sig Dispense Refill   Calcium  Carbonate-Vit D-Min (CALCIUM  1200 PO) Take 2-3 tablets by mouth daily. Reported on 05/26/2015     cholecalciferol  (VITAMIN D3) 10 MCG (400 UNIT) TABS tablet Take 400 Units by mouth daily.     Multiple Vitamin (MULTIVITAMIN ADULT PO) Take 1 tablet by mouth daily.     pantoprazole  (PROTONIX ) 40 MG tablet Take 1 tablet (40 mg total) by mouth 2 (two) times daily before a meal. 60 tablet 1   polyethylene glycol (MIRALAX  /  GLYCOLAX ) 17 g packet Take 17 g by mouth daily.     Probiotic Product (PROBIOTIC DAILY PO) Take 1 tablet by mouth daily.     psyllium (REGULOID) 0.52 g capsule Take 0.52 g by mouth daily.     tamsulosin  (FLOMAX ) 0.4 MG CAPS capsule Take 1 capsule (0.4 mg total) by mouth daily. 30 capsule 3   ZINC  OXIDE PO Take 1 tablet by mouth daily.     cyclobenzaprine  (FLEXERIL ) 10 MG tablet Take 0.5-1 tablets (5-10 mg total) by mouth at bedtime as needed for muscle spasms. (Patient not taking: Reported on 04/17/2023) 30 tablet 2   ondansetron  (ZOFRAN -ODT) 4 MG disintegrating tablet Take 1 tablet (4 mg total) by mouth every 8 (eight) hours as needed for nausea or vomiting. (Patient not taking: Reported on 04/17/2023) 20 tablet 0   No current facility-administered medications on file prior to visit.    Review of Systems  Constitutional:  Negative for chills and fever.  HENT:  Positive for congestion, ear pain (ear fullness), rhinorrhea and sinus pressure.   Respiratory:  Positive for cough.   Cardiovascular:  Negative for chest pain and palpitations.  Gastrointestinal:  Negative for nausea and vomiting.      Objective:    BP 132/76   Temp 97.7 F (36.5 C) (Oral)   Ht 5' 10 (1.778 m)   Wt 126 lb 3.2 oz (57.2  kg)   LMP 11/03/2021 (Exact Date)   BMI 18.11 kg/m   BP Readings from Last 3 Encounters:  04/17/23 132/76  03/07/23 110/79  01/09/23 128/70   Wt Readings from Last 3 Encounters:  04/17/23 126 lb 3.2 oz (57.2 kg)  03/07/23 122 lb 3.2 oz (55.4 kg)  01/09/23 125 lb (56.7 kg)    Physical Exam Vitals reviewed.  Constitutional:      Appearance: She is well-developed.  HENT:     Head: Normocephalic and atraumatic.     Right Ear: Hearing, tympanic membrane, ear canal and external ear normal. No decreased hearing noted. No drainage, swelling or tenderness. No middle ear effusion. No foreign body. Tympanic membrane is not erythematous or bulging.     Left Ear: Hearing, tympanic membrane,  ear canal and external ear normal. No decreased hearing noted. No drainage, swelling or tenderness.  No middle ear effusion. No foreign body. Tympanic membrane is not erythematous or bulging.     Nose: Nose normal. No rhinorrhea.     Right Sinus: No maxillary sinus tenderness or frontal sinus tenderness.     Left Sinus: No maxillary sinus tenderness or frontal sinus tenderness.     Mouth/Throat:     Pharynx: Uvula midline. No oropharyngeal exudate or posterior oropharyngeal erythema.     Tonsils: No tonsillar abscesses.  Eyes:     Conjunctiva/sclera: Conjunctivae normal.  Cardiovascular:     Rate and Rhythm: Regular rhythm.     Pulses: Normal pulses.     Heart sounds: Normal heart sounds.  Pulmonary:     Effort: Pulmonary effort is normal.     Breath sounds: Normal breath sounds. No wheezing, rhonchi or rales.  Lymphadenopathy:     Head:     Right side of head: No submental, submandibular, tonsillar, preauricular, posterior auricular or occipital adenopathy.     Left side of head: No submental, submandibular, tonsillar, preauricular, posterior auricular or occipital adenopathy.     Cervical: No cervical adenopathy.  Skin:    General: Skin is warm and dry.  Neurological:     Mental Status: She is alert.  Psychiatric:        Speech: Speech normal.        Behavior: Behavior normal.        Thought Content: Thought content normal.

## 2023-04-17 NOTE — Assessment & Plan Note (Signed)
 Afebrile and without acute respiratory distress.  Duration 4 days.  Advised more likely viral URI.  Advised to stop Sudafed as suspect may be aggravating nasal dryness.  Advised continued use of dehumidifier and to start Mucinex  DM as needed.  She will let me know if symptoms do not improve.

## 2023-04-17 NOTE — Telephone Encounter (Signed)
Pt was informed that catheters will be ready for pick up at the front desk. Pt voiced understanding.

## 2023-04-20 ENCOUNTER — Ambulatory Visit: Payer: BC Managed Care – PPO

## 2023-04-20 ENCOUNTER — Telehealth: Payer: Self-pay | Admitting: *Deleted

## 2023-04-20 DIAGNOSIS — R002 Palpitations: Secondary | ICD-10-CM | POA: Diagnosis not present

## 2023-04-20 NOTE — Telephone Encounter (Signed)
 The patient came in today for help applying her cardiac monitor. This has been successfully applied and education given.

## 2023-04-24 ENCOUNTER — Ambulatory Visit: Payer: 59

## 2023-05-16 ENCOUNTER — Encounter: Payer: Self-pay | Admitting: Family

## 2023-05-16 ENCOUNTER — Other Ambulatory Visit: Payer: Self-pay

## 2023-05-16 DIAGNOSIS — R7989 Other specified abnormal findings of blood chemistry: Secondary | ICD-10-CM

## 2023-05-18 ENCOUNTER — Other Ambulatory Visit (INDEPENDENT_AMBULATORY_CARE_PROVIDER_SITE_OTHER): Payer: 59

## 2023-05-18 DIAGNOSIS — R7989 Other specified abnormal findings of blood chemistry: Secondary | ICD-10-CM

## 2023-05-19 ENCOUNTER — Encounter: Payer: Self-pay | Admitting: Family

## 2023-05-19 LAB — TSH: TSH: 1.31 m[IU]/L

## 2023-05-29 ENCOUNTER — Telehealth: Payer: Self-pay | Admitting: Physical Therapy

## 2023-05-29 NOTE — Telephone Encounter (Signed)
Called pt to inquire about moving up eval. Pt did not respond so left VM instructing pt to call back if she wanted to be seen today instead of tomorrow.

## 2023-05-30 ENCOUNTER — Ambulatory Visit: Payer: 59 | Attending: Family | Admitting: Physical Therapy

## 2023-05-30 DIAGNOSIS — R079 Chest pain, unspecified: Secondary | ICD-10-CM | POA: Insufficient documentation

## 2023-05-30 DIAGNOSIS — M79622 Pain in left upper arm: Secondary | ICD-10-CM | POA: Insufficient documentation

## 2023-05-30 NOTE — Therapy (Signed)
 OUTPATIENT PHYSICAL THERAPY SHOULDER EVALUATION   Patient Name: Cristina Wade MRN: [id number] DOB:July 15, 1970, 53 y.o., female Today's Date: 05/30/2023  END OF SESSION:  PT End of Session - 05/30/23 1621     Visit Number 1    Number of Visits 20    Date for PT Re-Evaluation 08/08/23    Authorization Type [employer] [id number]    Authorization Time Period VL based on medical necessity    Authorization - Visit Number 1    Authorization - Number of Visits 20    Progress Note Due on Visit 10    PT Start Time 1430    PT Stop Time 1515    PT Time Calculation (min) 45 min    Activity Tolerance Patient tolerated treatment well    Behavior During Therapy WFL for tasks assessed/performed             Past Medical History:  Diagnosis Date   Anxiety    Asthma    Chronic pelvic pain in female 2018   Cystitis    Cystocele    Endometrial polyp    Endometriosis    per pt report but never seen during surg   Frequent headaches    Gastritis    GERD (gastroesophageal reflux disease)    rare   Gross hematuria    History of kidney stones    Microscopic hematuria    Migraine    Migraines    Neuropathy    Osteopenia    Urinary disorder    UTI (lower urinary tract infection)    Past Surgical History:  Procedure Laterality Date   COLONOSCOPY N/A 11/05/2014   Procedure: COLONOSCOPY;  Surgeon: Scot Jun, MD;  Location: [facility] ENDOSCOPY;  Service: Endoscopy;  Laterality: N/A;   COLONOSCOPY  11/2016   Dr. Mechele Collin   CYSTOSCOPY  2007   with biopsy    CYSTOSCOPY N/A 06/01/2016   Procedure: CYSTOSCOPY;  Surgeon: Vena Austria, MD;  Location: [facility] ORS;  Service: Gynecology;  Laterality: N/A;   DIAGNOSTIC LAPAROSCOPY     DILATATION & CURETTAGE/HYSTEROSCOPY WITH MYOSURE N/A 04/13/2015   Procedure: DILATATION & CURETTAGE/HYSTEROSCOPY WITH MYOSURE/POLYPECTOMY;  Surgeon: Elenora Fender Ward, MD;  Location: [facility] ORS;  Service: Gynecology;  Laterality: N/A;   DILATATION & CURETTAGE/HYSTEROSCOPY WITH  MYOSURE N/A 06/01/2016   Procedure: DILATATION & CURETTAGE/HYSTEROSCOPY WITH MYOSURE;  Surgeon: Vena Austria, MD;  Location: [facility] ORS;  Service: Gynecology;  Laterality: N/A;   DILATION AND CURETTAGE OF UTERUS     ESOPHAGOGASTRODUODENOSCOPY (EGD) WITH PROPOFOL N/A 08/31/2022   Procedure: ESOPHAGOGASTRODUODENOSCOPY (EGD) WITH PROPOFOL;  Surgeon: Jaynie Collins, DO;  Location: [facility] ENDOSCOPY;  Service: Gastroenterology;  Laterality: N/A;   GUM SURGERY     laproscopy  2007   POLYPECTOMY     endometrial   Patient Active Problem List   Diagnosis Date Noted   Bronchitis 04/17/2023   Palpitations 03/08/2023   Genetic testing 10/24/2022   Thoracic lymphadenopathy 09/12/2022   Family history of cancer 09/12/2022   Cecum mass 09/12/2022   Renal mass 09/06/2022   Upper GI bleeding 08/31/2022   Depression 08/31/2022   Chronic GERD 08/31/2022   Upper GI bleed 08/31/2022   Abnormal abdominal CT scan 08/31/2022   Subacute thyroiditis 03/08/2022   Thyroid antibody positive 03/08/2022   Epigastric pain 12/09/2021   Nausea 12/09/2021   Hyperthyroidism 12/09/2021   Neck pain 11/30/2021   Vasomotor flushing 11/30/2021   Chills (without fever) 04/15/2021   Dysuria 08/20/2020   Neurogenic bladder 08/20/2020   Pelvic  pain in female 12/25/2019   Chronic low back pain 12/25/2019   Chronic venous insufficiency 12/24/2019   Lymphedema 12/24/2019   Lower extremity edema 12/03/2019   HLD (hyperlipidemia) 10/10/2019   Low back pain radiating to left leg 10/04/2019   SOB (shortness of breath) 09/20/2019   Orthopnea 09/20/2019   Chronic abdominal pain 05/14/2019   Irritable bowel syndrome with constipation 05/14/2019   Chronic constipation 12/25/2018   Urinary retention 08/12/2018   Anxiety 08/12/2018   Elevated blood pressure reading without diagnosis of hypertension 07/12/2018   Left leg pain 07/12/2018   Acute left-sided low back pain 06/03/2018   Cloudy urine 04/29/2018   Chronic  chest pain 04/29/2018   Headache 04/27/2018   Localized, primary osteoarthritis 02/07/2018   Pelvic pain 01/30/2018   Slow transit constipation 10/10/2017   Bilateral impacted cerumen 11/28/2016   Chronic bilateral low back pain without sciatica 11/28/2016   Precordial pain 07/07/2016   Right bundle branch block (RBBB) on electrocardiogram (ECG) 07/07/2016   Chest tightness 06/27/2016   Preventative health care 09/30/2015   Encounter to establish care 09/28/2014   Vestibular migraine 09/28/2014   Routine physical examination 11/21/2013    PCP: Rennie Plowman  FNP   REFERRING PROVIDER: Rennie Plowman  FNP   REFERRING DIAG: Noncardiac chest pain  THERAPY DIAG:  Pain in left upper arm  Rationale for Evaluation and Treatment: Rehabilitation  ONSET DATE: 2022 years   SUBJECTIVE:                                                                                                                                                                                      SUBJECTIVE STATEMENT: See pertinent history  Hand dominance: Left  PERTINENT HISTORY: Pt reports developing chest pain that started in the middle of her chest about three years ago and that has since spread to her left chest. She has seen cardiology and they have ruled out all serious pathology. 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 She has felt it unloading boxes and lifting boxes. She has done these activities slowly and she has not felt it.   PAIN:  Are you having pain? No  PRECAUTIONS: None  RED FLAGS: None   WEIGHT BEARING RESTRICTIONS: No  FALLS:  Has patient fallen in last 6 months? 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   NEXT MD VISIT: She has a cardiologist apt scheduled for March 2025   OBJECTIVE:  Note: Objective measures were completed at Evaluation unless otherwise noted.  VITALS BP HR 71 SpO2 100% BP 98/63  DIAGNOSTIC FINDINGS:  CLINICAL DATA:  53 year old female with history of chest pain.   EXAM: CHEST - 2 VIEW   COMPARISON:  Chest x-ray 11/22/2022.   FINDINGS: Lung volumes are normal. No consolidative airspace disease. No pleural effusions. No pneumothorax. No pulmonary nodule or mass noted. Pulmonary vasculature and the cardiomediastinal silhouette are within normal limits.   IMPRESSION: No radiographic evidence of acute cardiopulmonary disease.     Electronically Signed   By: Trudie Reed M.D.   On: 12/06/2022 06:01  PATIENT SURVEYS:  None performed   COGNITION: Overall cognitive status: Within functional limits for tasks assessed     SENSATION: WFL  POSTURE: She has slightly rounded shoulders.   UPPER EXTREMITY ROM:  All shoulder AROM within normal limits                                                                              Cervical                                                   AROM                                            Flex    45                                                               Ext      45                                        Lat Side Bend R/L 45/45                 45/45                       Rotation       R/L     60/60                60/60*  Active ROM Right eval Left eval  Shoulder flexion    Shoulder extension    Shoulder abduction    Shoulder adduction    Shoulder internal rotation    Shoulder external rotation  Elbow flexion    Elbow extension    Wrist flexion    Wrist extension    Wrist ulnar deviation    Wrist radial deviation     Wrist pronation    Wrist supination    (Blank rows = not tested)                              UPPER EXTREMITY MMT:  MMT Right eval Left eval  Shoulder flexion 4+ 4+  Shoulder extension    Shoulder abduction 4+ 4+  Shoulder adduction    Shoulder internal rotation 4+ 4+  Shoulder external rotation 4+ 4+  Middle trapezius NT NT  Lower trapezius NT NT   Elbow flexion    Elbow extension    Wrist flexion    Wrist extension    Wrist ulnar deviation    Wrist radial deviation    Wrist pronation    Wrist supination    Grip strength (lbs)    (Blank rows = not tested)  SHOULDER SPECIAL TESTS: None performed   PALPATION:  Left 2nd proximal rib and sternum                                                                                                                              TREATMENT DATE:   05/30/23 (Eval)  Scapular Retraction AROM 1 x 10  Seated Rows with green band 1 x 10  Seated Rows with blue band 1 x 10    PATIENT EDUCATION: Education details: Explanation about possible causes of the left chest pain: Costochondritis vs Non-MSK pathologies. How to perform exercises with correct form.  Person educated: Patient Education method: Explanation, Demonstration, Verbal cues, and Handouts Education comprehension: verbalized understanding, returned demonstration, and verbal cues required  HOME EXERCISE PROGRAM: Access Code: [email or web address] URL: [email or web address] Date: 05/30/2023 Prepared by: Ellin Goodie  Exercises - Seated Shoulder Row with Anchored Resistance  - 3-4 x weekly - 3 sets - 10 reps  ASSESSMENT:  CLINICAL IMPRESSION: Patient is a 53 y.o. white female who was seen today for physical therapy evaluation and treatment for left sided chest pain that is from non-cardiac. Pt's only deficit is focal pain localized over proximal surface of 2nd rib and sternum that are indicative of costochondritis. Does not have clear presentation of  costochondritis as pt tends to feel this the most when completing physical activity. PT to continue to evaluate further next session to attempt to replicate pain with similar activities to those of pt's work where she tends to experience more. Pt will benefit from skilled PT to resolve chest pain in order to complete job related activities without excessive pain or discomfort.   OBJECTIVE IMPAIRMENTS: pain.   ACTIVITY LIMITATIONS: carrying, lifting, and bending  PARTICIPATION LIMITATIONS: occupation  PERSONAL FACTORS: Past/current experiences, Profession, and Time since onset of injury/illness/exacerbation are also affecting patient's functional outcome.   REHAB POTENTIAL: Fair Try  to determine whether this is a musculoskeletal condition.  CLINICAL DECISION MAKING: Evolving/moderate complexity  EVALUATION COMPLEXITY: Low   GOALS: Goals reviewed with patient? No  SHORT TERM GOALS: Target date: 06/13/2023  Patient will demonstrate undestanding of home exercise plan by performing exercises correctly with evidence of good carry over with min to no verbal or tactile cues .   Baseline: NT  Goal status: INITIAL   LONG TERM GOALS: Target date: 08/08/2023  Patient will demonstrate in standing posture as evidenced by her ability to have 0 cm of space between her occiput and the wall with her heels touching the back of wall and mid back touching wall to provide additional support and offload ribs to decreased pain with thoracic activity.  Baseline: NT   Goal status: INITIAL  2.  Patient will improve her persicapular strength by >=1/3 MMT for improved posture and spinal and rib cage support to offload structures and reduce pain with activity like lifting required for job.  Baseline: NT  Goal status: INITIAL    PLAN:  PT FREQUENCY: 1-2x/week  PT DURATION: 10 weeks  PLANNED INTERVENTIONS: 97164- PT Re-evaluation, 97110-Therapeutic exercises, 97530- Therapeutic activity, [id number]  Neuromuscular re-education, 97535- Self Care, 16109- Manual therapy, [id number]- Gait training, [phone]- Aquatic Therapy, 97014- Electrical stimulation (unattended), [phone]- Electrical stimulation (manual), Patient/Family education, Balance training, Stair training, Dry Needling, Joint mobilization, Joint manipulation, Spinal manipulation, Spinal mobilization, Cryotherapy, and Moist heat  PLAN FOR NEXT SESSION: Rule out thoracic radiculopathy pain. Try to reproduce with her trying to lift something comparable in weight to boxes and see if this brings on concordant pain. Test periscapular strength.    Ellin Goodie PT, DPT  [facility] [address], [place], [address] Phone: [phone]   Fax:  [phone]

## 2023-06-04 ENCOUNTER — Ambulatory Visit: Payer: 59 | Admitting: Physical Therapy

## 2023-06-05 ENCOUNTER — Ambulatory Visit: Payer: 59 | Admitting: Podiatry

## 2023-06-05 DIAGNOSIS — M7752 Other enthesopathy of left foot: Secondary | ICD-10-CM | POA: Diagnosis not present

## 2023-06-05 NOTE — Progress Notes (Signed)
 Subjective:  Patient ID: Cristina Wade, female    DOB: 1971-03-01,  MRN: [id number]  Chief Complaint  Patient presents with   Foot Pain    53 y.o. female presents with the above complaint.  Patient presents with left ankle pain.  She states this started hurting.  She wants to discuss treatment options.  She is known to Dr. Al Corpus who had treated her in the past for the left foot.  Hurts with ambulation worse with pressure she would like to do an injection   Review of Systems: Negative except as noted in the HPI. Denies N/V/F/Ch.  Past Medical History:  Diagnosis Date   Anxiety    Asthma    Chronic pelvic pain in female 2018   Cystitis    Cystocele    Endometrial polyp    Endometriosis    per pt report but never seen during surg   Frequent headaches    Gastritis    GERD (gastroesophageal reflux disease)    rare   Gross hematuria    History of kidney stones    Microscopic hematuria    Migraine    Migraines    Neuropathy    Osteopenia    Urinary disorder    UTI (lower urinary tract infection)     Current Outpatient Medications:    Calcium Carbonate-Vit D-Min (CALCIUM 1200 PO), Take 2-3 tablets by mouth daily. Reported on 05/26/2015, Disp: , Rfl:    cholecalciferol (VITAMIN D3) 10 MCG (400 UNIT) TABS tablet, Take 400 Units by mouth daily., Disp: , Rfl:    cyclobenzaprine (FLEXERIL) 10 MG tablet, Take 0.5-1 tablets (5-10 mg total) by mouth at bedtime as needed for muscle spasms. (Patient not taking: Reported on 06/11/2023), Disp: 30 tablet, Rfl: 2   Multiple Vitamin (MULTIVITAMIN ADULT PO), Take 1 tablet by mouth daily., Disp: , Rfl:    ondansetron (ZOFRAN-ODT) 4 MG disintegrating tablet, Take 1 tablet (4 mg total) by mouth every 8 (eight) hours as needed for nausea or vomiting. (Patient not taking: Reported on 06/11/2023), Disp: 20 tablet, Rfl: 0   pantoprazole (PROTONIX) 40 MG tablet, Take 1 tablet (40 mg total) by mouth 2 (two) times daily before a meal., Disp: 60 tablet, Rfl: 1    polyethylene glycol (MIRALAX / GLYCOLAX) 17 g packet, Take 17 g by mouth daily., Disp: , Rfl:    Probiotic Product (PROBIOTIC DAILY PO), Take 1 tablet by mouth daily., Disp: , Rfl:    psyllium (REGULOID) 0.52 g capsule, Take 0.52 g by mouth daily., Disp: , Rfl:    tamsulosin (FLOMAX) 0.4 MG CAPS capsule, Take 1 capsule (0.4 mg total) by mouth daily. (Patient not taking: Reported on 06/11/2023), Disp: 30 capsule, Rfl: 3   ZINC OXIDE PO, Take 1 tablet by mouth daily., Disp: , Rfl:   Social History   Tobacco Use  Smoking Status Never   Passive exposure: Never  Smokeless Tobacco Never    Allergies  Allergen Reactions   Milk (Cow) Other (See Comments)   Milk-Related Compounds    Omeprazole Other (See Comments)    Pt reports "Chest pain, HA and Dysuria." 01/05/22 patient states she is able to take omeprazole now without a reaction   Penicillins Itching and Rash   Objective:  There were no vitals filed for this visit. There is no height or weight on file to calculate BMI. Constitutional Well developed. Well nourished.  Vascular Dorsalis pedis pulses palpable bilaterally. Posterior tibial pulses palpable bilaterally. Capillary refill normal to all digits.  No cyanosis or clubbing noted. Pedal hair growth normal.  Neurologic Normal speech. Oriented to person, place, and time. Epicritic sensation to light touch grossly present bilaterally.  Dermatologic Nails well groomed and normal in appearance. No open wounds. No skin lesions.  Orthopedic: Pain on palpation to the left ankle pain with range of motion of the ankle mild deep intra-articular ankle pain noted no pain at the posterior tibial peroneal ATFL ligament   Radiographs: None Assessment:   1. Capsulitis of ankle, left    Plan:  Patient was evaluated and treated and all questions answered.  Left ankle capsulitis -All questions and concerns were discussed with the patient in extensive detail given the amount of pain that  she is having she will benefit from steroid injection help decrease acute inflammatory component associate with pain.  Patient agrees with plan to proceed with steroid injection -A steroid injection was performed at left ankle using 1% plain Lidocaine and 10 mg of Kenalog. This was well tolerated.   No follow-ups on file.

## 2023-06-11 ENCOUNTER — Ambulatory Visit: Payer: 59 | Attending: Internal Medicine | Admitting: Internal Medicine

## 2023-06-11 ENCOUNTER — Encounter: Payer: Self-pay | Admitting: Internal Medicine

## 2023-06-11 VITALS — BP 96/58 | HR 73 | Ht 70.0 in | Wt 128.2 lb

## 2023-06-11 DIAGNOSIS — G8929 Other chronic pain: Secondary | ICD-10-CM | POA: Diagnosis not present

## 2023-06-11 DIAGNOSIS — R002 Palpitations: Secondary | ICD-10-CM | POA: Diagnosis not present

## 2023-06-11 DIAGNOSIS — R079 Chest pain, unspecified: Secondary | ICD-10-CM

## 2023-06-11 NOTE — Progress Notes (Unsigned)
  Cardiology Office Note:  .   Date:  06/11/2023  ID:  Cristina Wade, DOB [birth year], MRN [id number] PCP: [name], FNP  [facility] Providers Cardiologist:  Yvonne Kendall, MD { Click to update primary MD,subspecialty MD or APP then REFRESH:1}    History of Present Illness: .   Cristina Wade is a 53 y.o. female with recurrent chronic chest pain with normal coronary arteries by CTA, asthma, GERD, and migraine headaches, who presents for follow-up of chest pain.  I last saw her in 02/2023, at which time she complained of sporadic left-sided chest pain most pronounced with exertion.  We agreed to an empiric trial of ibuprofen for possible musculoskeletal chest pain.  Due to some palpitations, she subsequently wore an event monitor that showed predominantly sinus rhythm with rare PACs and PVCs as well as a few brief supraventricular runs.  Had some improve  ROS: See HPI  Studies Reviewed: Marland Kitchen   EKG Interpretation Date/Time:  Monday June 11 2023 14:37:03 EST Ventricular Rate:  73 PR Interval:  150 QRS Duration:  88 QT Interval:  394 QTC Calculation: 434 R Axis:   85  Text Interpretation: Normal sinus rhythm Normal ECG When compared with ECG of 07-Mar-2023 16:02, No significant change was found Confirmed by Ronte Parker, Cristal Deer [phone]) on 06/11/2023 2:40:52 PM    Event monitor (04/03/2023): Predominantly sinus rhythm with rare PACs and PVCs as well as a few brief supraventricular runs lasting up to 10 beats.  Risk Assessment/Calculations:             Physical Exam:   VS:  BP (!) 96/58 (BP Location: Left Arm, Patient Position: Sitting, Cuff Size: Normal)   Pulse 73   Ht 5\' 10"  (1.778 m)   Wt 128 lb 3.2 oz (58.2 kg)   LMP 11/03/2021 (Exact Date)   SpO2 96%   BMI 18.39 kg/m    Wt Readings from Last 3 Encounters:  06/11/23 128 lb 3.2 oz (58.2 kg)  04/17/23 126 lb 3.2 oz (57.2 kg)  03/07/23 122 lb 3.2 oz (55.4 kg)    General:  NAD. Neck: No JVD or HJR. Lungs: Clear  to auscultation bilaterally without wheezes or crackles. Heart: Regular rate and rhythm without murmurs, rubs, or gallops. Abdomen: Soft, nontender, nondistended. Extremities: No lower extremity edema.  ASSESSMENT AND PLAN: .    ***    {Are you ordering a CV Procedure (e.g. stress test, cath, DCCV, TEE, etc)?   Press F2        :[id number]}  Dispo: ***  Signed, Yvonne Kendall, MD

## 2023-06-11 NOTE — Patient Instructions (Signed)
 Medication Instructions:  Your physician recommends that you continue on your current medications as directed. Please refer to the Current Medication list given to you today.   *If you need a refill on your cardiac medications before your next appointment, please call your pharmacy*   Lab Work: No labs ordered today    Testing/Procedures: No test ordered today    Follow-Up: At [facility], you and your health needs are our priority.  As part of our continuing mission to provide you with exceptional heart care, we have created designated Provider Care Teams.  These Care Teams include your primary Cardiologist (physician) and Advanced Practice Providers (APPs -  Physician Assistants and Nurse Practitioners) who all work together to provide you with the care you need, when you need it.  We recommend signing up for the patient portal called "MyChart".  Sign up information is provided on this After Visit Summary.  MyChart is used to connect with patients for Virtual Visits (Telemedicine).  Patients are able to view lab/test results, encounter notes, upcoming appointments, etc.  Non-urgent messages can be sent to your provider as well.   To learn more about what you can do with MyChart, go to [email or web address].    Your next appointment:   3 month(s)  Provider:   You may see Yvonne Kendall, MD or one of the following Advanced Practice Providers on your designated Care Team:   Nicolasa Ducking, NP Eula Listen, PA-C Cadence Fransico Michael, PA-C Charlsie Quest, NP Carlos Levering, NP

## 2023-06-13 ENCOUNTER — Encounter: Payer: Self-pay | Admitting: Internal Medicine

## 2023-06-19 ENCOUNTER — Other Ambulatory Visit: Payer: Self-pay | Admitting: Neurology

## 2023-06-19 DIAGNOSIS — G43019 Migraine without aura, intractable, without status migrainosus: Secondary | ICD-10-CM

## 2023-06-19 DIAGNOSIS — G373 Acute transverse myelitis in demyelinating disease of central nervous system: Secondary | ICD-10-CM

## 2023-06-20 ENCOUNTER — Ambulatory Visit: Payer: 59 | Admitting: Podiatry

## 2023-06-20 ENCOUNTER — Encounter: Payer: Self-pay | Admitting: Podiatry

## 2023-06-20 DIAGNOSIS — M7752 Other enthesopathy of left foot: Secondary | ICD-10-CM

## 2023-06-20 NOTE — Progress Notes (Signed)
 Cristina Wade followed up today with a chief complaint of pain to her left ankle.  Saw Dr. Allena Katz last month who put an injection in the ankle and she states they really did not even last the rest of the day and then become painful again.  She saw her orthopedist who told her that she had bone edema and that it would take a while for this to get better.  Objective: Vital signs are stable alert oriented x 3.  Pulses are palpable.  The majority of her pain is located over the peroneal tendons.  I reviewed the MRI and the MRI report which does demonstrate bony edema sprain ankle history of the ATFL scarring down.  Also fraying of the peroneal tendons.  Assessment: Peroneal tendinitis possible tear of the peroneal tendons bone edema to the ankle.  Plan: Recommended that she get in her cam boot during the day while she is at home but when she is working she is to wear her ankle brace with good tennis shoes.  We did discuss wear to purchase tennis shoes and we also discussed topical anti-inflammatories such as Voltaren gel.  I will follow-up with her on an as-needed basis explained to her that should this worsen we may need to repeat an MRI in May if not improved.

## 2023-06-20 NOTE — Progress Notes (Unsigned)
 06/21/23 3:25 PM   Cristina Wade January 06, 1971 [id number]  Referring provider:  [name], FNP [address] [place],  [place] [address]  Urological history  1. High risk hematuria -non-smoker -work up 2015 -bilateral punctate stones -cysto 2019 NED -non-contrast CT 2022 - bilateral punctate stones -cysto, 2022 - NED -RUS (08/2022) - bilateral renal stones -PET scan (09/2022) - NED -CTU (12/2022) -bilateral nephrolithiasis   2. Neurogenic bladder -UDS 2017 indicating nonobstructive urinary retention with poor bladder sensation and external sphincter dyssynergia -completed 12 weeks of PTNS -completed pelvic floor PT -manages with self cathing three times daily   3. Nephrolithiasis -non-contrast CT 2022 2 mm stone in the right kidney lower pole without hydronephrosis.  Three tiny stones in left kidney lower pole region without hydronephrosis -CTU (12/2022) -bilateral nephrolithiasis   4. rUTI's -contributing factors of age, incomplete emptying, vaginal atrophy, chronic constipation,  -documented urine cultures over the last year  November 26, 2022 - E. coli  December 01, 2022 - E.coli  October 11, 2022 - Citrobacter koseri   Aug 22, 2022 - E.coli  August 11, 2022 - E. Coli    Chief Complaint  Patient presents with   possible UTI    HPI: Cristina Wade is a 53 y.o.female who presents today for possible UTI with her sister, Cristina Wade.   Previous records reviewed.   She states she is having chills, lower back pain and has felt feverish.  She did not take her temperature.  Patient denies any modifying or aggravating factors.  Patient denies any recent UTI's, gross hematuria, dysuria or suprapubic/flank pain.  Patient denies any fevers, chills, nausea or vomiting.    UA yellow clear, specific gravity 1.015, pH 7.0, WBC 0-5, RBC 0-2, epithelial cells 0-10, amorphous sediment present, no mucus threads and many bacteria.  She states she is getting different residuals on  different days when she self caths and she is concerned about this.  PMH: Past Medical History:  Diagnosis Date   Anxiety    Asthma    Chronic pelvic pain in female 2018   Cystitis    Cystocele    Endometrial polyp    Endometriosis    per pt report but never seen during surg   Frequent headaches    Gastritis    GERD (gastroesophageal reflux disease)    rare   Gross hematuria    History of kidney stones    Microscopic hematuria    Migraine    Migraines    Neuropathy    Osteopenia    Urinary disorder    UTI (lower urinary tract infection)     Surgical History: Past Surgical History:  Procedure Laterality Date   COLONOSCOPY N/A 11/05/2014   Procedure: COLONOSCOPY;  Surgeon: Scot Jun, MD;  Location: [facility] ENDOSCOPY;  Service: Endoscopy;  Laterality: N/A;   COLONOSCOPY  11/2016   Dr. Mechele Collin   CYSTOSCOPY  2007   with biopsy    CYSTOSCOPY N/A 06/01/2016   Procedure: CYSTOSCOPY;  Surgeon: Vena Austria, MD;  Location: [facility] ORS;  Service: Gynecology;  Laterality: N/A;   DIAGNOSTIC LAPAROSCOPY     DILATATION & CURETTAGE/HYSTEROSCOPY WITH MYOSURE N/A 04/13/2015   Procedure: DILATATION & CURETTAGE/HYSTEROSCOPY WITH MYOSURE/POLYPECTOMY;  Surgeon: Elenora Fender Ward, MD;  Location: [facility] ORS;  Service: Gynecology;  Laterality: N/A;   DILATATION & CURETTAGE/HYSTEROSCOPY WITH MYOSURE N/A 06/01/2016   Procedure: DILATATION & CURETTAGE/HYSTEROSCOPY WITH MYOSURE;  Surgeon: Vena Austria, MD;  Location: [facility] ORS;  Service: Gynecology;  Laterality:  N/A;   DILATION AND CURETTAGE OF UTERUS     ESOPHAGOGASTRODUODENOSCOPY (EGD) WITH PROPOFOL N/A 08/31/2022   Procedure: ESOPHAGOGASTRODUODENOSCOPY (EGD) WITH PROPOFOL;  Surgeon: Jaynie Collins, DO;  Location: [facility] ENDOSCOPY;  Service: Gastroenterology;  Laterality: N/A;   GUM SURGERY     laproscopy  2007   POLYPECTOMY     endometrial    Home Medications:  Allergies as of 06/21/2023       Reactions   Milk (cow) Other (See  Comments)   Milk-related Compounds    Omeprazole Other (See Comments)   Pt reports "Chest pain, HA and Dysuria." 01/05/22 patient states she is able to take omeprazole now without a reaction   Penicillins Itching, Rash        Medication List        Accurate as of June 21, 2023  3:25 PM. If you have any questions, ask your nurse or doctor.          STOP taking these medications    cyclobenzaprine 10 MG tablet Commonly known as: FLEXERIL Stopped by: Rukaya Kleinschmidt   ondansetron 4 MG disintegrating tablet Commonly known as: ZOFRAN-ODT Stopped by: Misheel Gowans   tamsulosin 0.4 MG Caps capsule Commonly known as: FLOMAX Stopped by: Diamond Jentz       TAKE these medications    CALCIUM 1200 PO Take 2-3 tablets by mouth daily. Reported on 05/26/2015   cholecalciferol 10 MCG (400 UNIT) Tabs tablet Commonly known as: VITAMIN D3 Take 400 Units by mouth daily.   LORazepam 0.5 MG tablet Commonly known as: ATIVAN Take by mouth.   MULTIVITAMIN ADULT PO Take 1 tablet by mouth daily.   pantoprazole 40 MG tablet Commonly known as: PROTONIX Take 1 tablet (40 mg total) by mouth 2 (two) times daily before a meal.   polyethylene glycol 17 g packet Commonly known as: MIRALAX / GLYCOLAX Take 17 g by mouth daily.   PROBIOTIC DAILY PO Take 1 tablet by mouth daily.   psyllium 0.52 g capsule Commonly known as: REGULOID Take 0.52 g by mouth daily.   ZINC OXIDE PO Take 1 tablet by mouth daily.        Allergies:  Allergies  Allergen Reactions   Milk (Cow) Other (See Comments)   Milk-Related Compounds    Omeprazole Other (See Comments)    Pt reports "Chest pain, HA and Dysuria." 01/05/22 patient states she is able to take omeprazole now without a reaction   Penicillins Itching and Rash    Family History: Family History  Problem Relation Age of Onset   Arthritis Mother    Heart disease Mother    Stroke Mother        TIA   Hypertension Mother    Diabetes  Mother    Kidney cancer Mother 52   Heart failure Mother    Hyperlipidemia Mother    Colon cancer Father 71   Arthritis Father    Hyperlipidemia Father    Transient ischemic attack Father    Cancer - Colon Father 20   Prostate cancer Father 36   Valvular heart disease Father    Cancer Maternal Grandmother    Cancer Paternal Grandfather        prostate   Breast cancer Cousin 30       has contact    Social History:  reports that she has never smoked. She has never been exposed to tobacco smoke. She has never used smokeless tobacco. She reports that she does not drink alcohol  and does not use drugs.   Physical Exam: BP 107/68   Pulse 60   Ht 5\' 10"  (1.778 m)   Wt 128 lb (58.1 kg)   LMP 11/03/2021 (Exact Date)   BMI 18.37 kg/m   Constitutional:  Well nourished. Alert and oriented, No acute distress. HEENT: [place] AT, moist mucus membranes.  Trachea midline Cardiovascular: No clubbing, cyanosis, or edema. Respiratory: Normal respiratory effort, no increased work of breathing. Neurologic: Grossly intact, no focal deficits, moving all 4 extremities. Psychiatric: Normal mood and affect.    Laboratory Data: Urinalysis See EPIC and HPI I have reviewed the labs.  See HPI.     Pertinent imaging N/A  Assessment & Plan:    1. High risk hematuria -non-smoker -work up x (2019, 2022)  - NED -CT urogram w/o worrisome GU findings  2. Neurogenic bladder  -continue CIC x 3 daily  -Explained that her amount of urine that she outputs during the day will vary due to fluid and liquid intake, hydration status and even the weather can affect this and as long as it shows a good output in total for the day and there is no gross hematuria and is not worrisome -RUS ordered for further evaluation of lower back pain, but I am suspicious he may have a muscle skeletal in nature  3. UTI -UA with bacteriuria -urine culture sent in an abundance of caution -RUS pending   Cloretta Ned    [facility] [address], [address] [place], [place] [address] [phone]

## 2023-06-21 ENCOUNTER — Encounter: Payer: Self-pay | Admitting: Urology

## 2023-06-21 ENCOUNTER — Ambulatory Visit: Payer: 59 | Admitting: Urology

## 2023-06-21 VITALS — BP 107/68 | HR 60 | Ht 70.0 in | Wt 128.0 lb

## 2023-06-21 DIAGNOSIS — N319 Neuromuscular dysfunction of bladder, unspecified: Secondary | ICD-10-CM

## 2023-06-21 DIAGNOSIS — R319 Hematuria, unspecified: Secondary | ICD-10-CM | POA: Diagnosis not present

## 2023-06-21 DIAGNOSIS — N390 Urinary tract infection, site not specified: Secondary | ICD-10-CM

## 2023-06-21 LAB — URINALYSIS, COMPLETE
Bilirubin, UA: NEGATIVE
Glucose, UA: NEGATIVE
Ketones, UA: NEGATIVE
Leukocytes,UA: NEGATIVE
Nitrite, UA: NEGATIVE
Protein,UA: NEGATIVE
RBC, UA: NEGATIVE
Specific Gravity, UA: 1.015 (ref 1.005–1.030)
Urobilinogen, Ur: 0.2 mg/dL (ref 0.2–1.0)
pH, UA: 7 (ref 5.0–7.5)

## 2023-06-21 LAB — MICROSCOPIC EXAMINATION

## 2023-06-23 ENCOUNTER — Ambulatory Visit
Admission: RE | Admit: 2023-06-23 | Discharge: 2023-06-23 | Disposition: A | Discharge status: Home / Self Care | Source: Ambulatory Visit | Attending: Neurology | Admitting: Neurology

## 2023-06-23 DIAGNOSIS — G43019 Migraine without aura, intractable, without status migrainosus: Secondary | ICD-10-CM | POA: Insufficient documentation

## 2023-06-23 DIAGNOSIS — G373 Acute transverse myelitis in demyelinating disease of central nervous system: Secondary | ICD-10-CM

## 2023-06-23 MED ORDER — GADOBUTROL 1 MMOL/ML IV SOLN
5.0000 mL | Freq: Once | INTRAVENOUS | Status: AC | PRN
  Administered 2023-06-23: 5 mL via INTRAVENOUS

## 2023-06-24 ENCOUNTER — Other Ambulatory Visit: Payer: Self-pay | Admitting: Urology

## 2023-06-24 LAB — CULTURE, URINE COMPREHENSIVE

## 2023-06-25 ENCOUNTER — Ambulatory Visit

## 2023-06-25 ENCOUNTER — Ambulatory Visit: Payer: Self-pay | Admitting: Podiatry

## 2023-06-26 ENCOUNTER — Other Ambulatory Visit: Payer: Self-pay

## 2023-06-26 MED ORDER — CEFUROXIME AXETIL 500 MG PO TABS: 500.0000 mg | ORAL_TABLET | Freq: Two times a day (BID) | ORAL | 0 refills | Status: AC

## 2023-06-27 ENCOUNTER — Ambulatory Visit
Admission: RE | Admit: 2023-06-27 | Discharge: 2023-06-27 | Disposition: A | Discharge status: Home / Self Care | Source: Ambulatory Visit | Attending: Urology | Admitting: Urology

## 2023-06-27 ENCOUNTER — Ambulatory Visit: Payer: Self-pay | Admitting: Podiatry

## 2023-06-27 DIAGNOSIS — N390 Urinary tract infection, site not specified: Secondary | ICD-10-CM | POA: Insufficient documentation

## 2023-06-28 ENCOUNTER — Ambulatory Visit

## 2023-07-03 ENCOUNTER — Ambulatory Visit: Payer: 59 | Attending: Family | Admitting: Physical Therapy

## 2023-07-03 DIAGNOSIS — M79622 Pain in left upper arm: Secondary | ICD-10-CM | POA: Diagnosis present

## 2023-07-03 NOTE — Therapy (Signed)
 OUTPATIENT PHYSICAL THERAPY SHOULDER TREATMENT    Patient Name: Cristina Wade MRN: [id number] DOB:02/02/1971, 53 y.o., female Today's Date: 07/03/2023  END OF SESSION:  PT End of Session - 07/03/23 1521     Visit Number 2    Number of Visits 20    Date for PT Re-Evaluation 08/08/23    Authorization Type [id number]    Authorization Time Period VL based on medical necessity    Authorization - Visit Number 2    Authorization - Number of Visits 20    Progress Note Due on Visit 10    PT Start Time 1520    PT Stop Time 1600    PT Time Calculation (min) 40 min    Activity Tolerance Patient tolerated treatment well    Behavior During Therapy WFL for tasks assessed/performed              Past Medical History:  Diagnosis Date   Anxiety    Asthma    Chronic pelvic pain in female 2018   Cystitis    Cystocele    Endometrial polyp    Endometriosis    per pt report but never seen during surg   Frequent headaches    Gastritis    GERD (gastroesophageal reflux disease)    rare   Gross hematuria    History of kidney stones    Microscopic hematuria    Migraine    Migraines    Neuropathy    Osteopenia    Urinary disorder    UTI (lower urinary tract infection)    Past Surgical History:  Procedure Laterality Date   COLONOSCOPY N/A 11/05/2014   Procedure: COLONOSCOPY;  Surgeon: Scot Jun, MD;  Location: [facility] ENDOSCOPY;  Service: Endoscopy;  Laterality: N/A;   COLONOSCOPY  11/2016   Dr. Mechele Collin   CYSTOSCOPY  2007   with biopsy    CYSTOSCOPY N/A 06/01/2016   Procedure: CYSTOSCOPY;  Surgeon: Vena Austria, MD;  Location: [facility] ORS;  Service: Gynecology;  Laterality: N/A;   DIAGNOSTIC LAPAROSCOPY     DILATATION & CURETTAGE/HYSTEROSCOPY WITH MYOSURE N/A 04/13/2015   Procedure: DILATATION & CURETTAGE/HYSTEROSCOPY WITH MYOSURE/POLYPECTOMY;  Surgeon: Elenora Fender Ward, MD;  Location: [facility] ORS;  Service: Gynecology;  Laterality: N/A;   DILATATION & CURETTAGE/HYSTEROSCOPY WITH  MYOSURE N/A 06/01/2016   Procedure: DILATATION & CURETTAGE/HYSTEROSCOPY WITH MYOSURE;  Surgeon: Vena Austria, MD;  Location: [facility] ORS;  Service: Gynecology;  Laterality: N/A;   DILATION AND CURETTAGE OF UTERUS     ESOPHAGOGASTRODUODENOSCOPY (EGD) WITH PROPOFOL N/A 08/31/2022   Procedure: ESOPHAGOGASTRODUODENOSCOPY (EGD) WITH PROPOFOL;  Surgeon: Jaynie Collins, DO;  Location[facility] ENDOSCOPY;  Service: Gastroenterology;  Laterality: N/A;   GUM SURGERY     laproscopy  2007   POLYPECTOMY     endometrial   Patient Active Problem List   Diagnosis Date Noted   Bronchitis 04/17/2023   Palpitations 03/08/2023   Genetic testing 10/24/2022   Thoracic lymphadenopathy 09/12/2022   Family history of cancer 09/12/2022   Cecum mass 09/12/2022   Renal mass 09/06/2022   Upper GI bleeding 08/31/2022   Depression 08/31/2022   Chronic GERD 08/31/2022   Upper GI bleed 08/31/2022   Abnormal abdominal CT scan 08/31/2022   Subacute thyroiditis 03/08/2022   Thyroid antibody positive 03/08/2022   Epigastric pain 12/09/2021   Nausea 12/09/2021   Hyperthyroidism 12/09/2021   Neck pain 11/30/2021   Vasomotor flushing 11/30/2021   Chills (without fever) 04/15/2021   Dysuria 08/20/2020   Neurogenic bladder 08/20/2020  Pelvic pain in female 12/25/2019   Chronic low back pain 12/25/2019   Chronic venous insufficiency 12/24/2019   Lymphedema 12/24/2019   Lower extremity edema 12/03/2019   HLD (hyperlipidemia) 10/10/2019   Low back pain radiating to left leg 10/04/2019   SOB (shortness of breath) 09/20/2019   Orthopnea 09/20/2019   Chronic abdominal pain 05/14/2019   Irritable bowel syndrome with constipation 05/14/2019   Chronic constipation 12/25/2018   Urinary retention 08/12/2018   Anxiety 08/12/2018   Elevated blood pressure reading without diagnosis of hypertension 07/12/2018   Left leg pain 07/12/2018   Acute left-sided low back pain 06/03/2018   Cloudy urine 04/29/2018   Chronic  chest pain 04/29/2018   Headache 04/27/2018   Localized, primary osteoarthritis 02/07/2018   Pelvic pain 01/30/2018   Slow transit constipation 10/10/2017   Bilateral impacted cerumen 11/28/2016   Chronic bilateral low back pain without sciatica 11/28/2016   Precordial pain 07/07/2016   Right bundle branch block (RBBB) on electrocardiogram (ECG) 07/07/2016   Chest tightness 06/27/2016   Preventative health care 09/30/2015   Encounter to establish care 09/28/2014   Vestibular migraine 09/28/2014   Routine physical examination 11/21/2013    PCP: Rennie Plowman  FNP   REFERRING PROVIDER: Rennie Plowman  FNP   REFERRING DIAG: Noncardiac chest pain  THERAPY DIAG:  Pain in left upper arm  Rationale for Evaluation and Treatment: Rehabilitation  ONSET DATE: 2022 years   SUBJECTIVE:                                                                                                                                                                                      SUBJECTIVE STATEMENT: Pt reports that chest pain has improved since last session with use of prednisone . She mostly experiences the pain after work in the evenings. She finished the prednisone around the end of February and felt the most relief. Pt states that when she gets home from work she would usually be very active by doing chores around the house.  Hand dominance: Left  PERTINENT HISTORY: Pt reports developing chest pain that started in the middle of her chest about three years ago and that has since spread to her left chest. She has seen cardiology and they have ruled out all serious pathology. 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 She has felt it unloading boxes and lifting boxes. She has done these activities slowly and she has not felt it.   PAIN:  Are you having pain? No  PRECAUTIONS: None  RED  FLAGS: None   WEIGHT BEARING RESTRICTIONS: No  FALLS:  Has patient fallen in last 6 months? 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   NEXT MD VISIT: She has a cardiologist apt scheduled for March 2025   OBJECTIVE:  Note: Objective measures were completed at Evaluation unless otherwise noted.  VITALS BP HR 71 SpO2 100% BP 98/63  DIAGNOSTIC FINDINGS:  CLINICAL DATA:  53 year old female with history of chest pain.   EXAM: CHEST - 2 VIEW   COMPARISON:  Chest x-ray 11/22/2022.   FINDINGS: Lung volumes are normal. No consolidative airspace disease. No pleural effusions. No pneumothorax. No pulmonary nodule or mass noted. Pulmonary vasculature and the cardiomediastinal silhouette are within normal limits.   IMPRESSION: No radiographic evidence of acute cardiopulmonary disease.     Electronically Signed   By: Trudie Reed M.D.   On: 12/06/2022 06:01  PATIENT SURVEYS:  None performed   COGNITION: Overall cognitive status: Within functional limits for tasks assessed     SENSATION: WFL  POSTURE: She has slightly rounded shoulders.   UPPER EXTREMITY ROM:  All shoulder AROM within normal limits                                                                              Cervical                                                   AROM                                            Flex    45                                                               Ext      45                                        Lat Side Bend R/L 45/45                 45/45                       Rotation       R/L  60/60                 60/60*  Active ROM Right eval Left eval  Shoulder flexion    Shoulder extension    Shoulder abduction    Shoulder adduction    Shoulder internal rotation    Shoulder external rotation    Elbow flexion    Elbow extension    Wrist flexion    Wrist extension    Wrist ulnar deviation    Wrist radial deviation    Wrist pronation    Wrist supination    (Blank rows = not tested)                              UPPER EXTREMITY MMT:  MMT Right eval Left eval  Shoulder flexion 4+ 4+  Shoulder extension    Shoulder abduction 4+ 4+  Shoulder adduction    Shoulder internal rotation 4+ 4+  Shoulder external rotation 4+ 4+  Middle trapezius 4- 4-  Lower trapezius 4- 4-  Elbow flexion    Elbow extension    Wrist flexion    Wrist extension    Wrist ulnar deviation    Wrist radial deviation    Wrist pronation    Wrist supination    Grip strength (lbs)    (Blank rows = not tested)  SHOULDER SPECIAL TESTS: None performed   PALPATION:  Left 2nd proximal rib and sternum                                                                                                                              TREATMENT DATE:   07/03/23:  SELF CARE   Activity modification at work including decrease trunk rotation when transferring pans   Discussed ongoing symptom management including ice for pain relief.   THEREX    MMT  Mid Trap R/L 4-/4- Lower Trap R/L 4-/4-    OMEGA Chest Press #20 1 x 10  -Pt reports no increase in her chest pain  D2 PNF Flexion and Extension with yellow band 1 x 10  D2 PNF Flexion and Extension AROM 1 x 10   05/30/23 (Eval)  Scapular Retraction AROM 1 x 10  Seated Rows with green band 1 x 10  Seated Rows with blue band 1 x 10    PATIENT EDUCATION: Education details: Explanation about possible causes of the left chest pain: Costochondritis vs Non-MSK pathologies. How to perform exercises with correct form.  Person educated: Patient Education method:  Explanation, Demonstration, Verbal cues, and Handouts Education comprehension: verbalized understanding, returned demonstration, and verbal cues required  HOME EXERCISE PROGRAM: Access Code: [email or web address] URL: [email or web address] Date: 07/03/2023 Prepared by: Ellin Goodie  Exercises - Seated Shoulder Row with Anchored Resistance  - 3-4 x weekly - 3 sets - 10 reps - Standing Shoulder Horizontal Abduction with Resistance  - 3-4 x weekly - 3 sets - 10  reps - Shoulder PNF D2 Flexion  - 3-4 x weekly - 3 sets - 10 reps  ASSESSMENT:  CLINICAL IMPRESSION: Pt continues to show signs and symptoms of costochondritis with pain that continues to be localized to the anterior ribs and that is relieved with use of steroids. PT recommended that pt modify her work activity to avoid trunk rotation and to reach out to PCP regarding potential medications that could help with inflammation and pain. Pt does show decreased periscapular strength and she will benefit from going PT to improve strength and posture to decreased rounding of shoulder and increased pressure on chest in hopes of relieving condition. She will report back in two weeks after trying activity modification to see if this makes a difference.   OBJECTIVE IMPAIRMENTS: pain.   ACTIVITY LIMITATIONS: carrying, lifting, and bending  PARTICIPATION LIMITATIONS: occupation  PERSONAL FACTORS: Past/current experiences, Profession, and Time since onset of injury/illness/exacerbation are also affecting patient's functional outcome.   REHAB POTENTIAL: Fair Try to determine whether this is a musculoskeletal condition.  CLINICAL DECISION MAKING: Evolving/moderate complexity  EVALUATION COMPLEXITY: Low   GOALS: Goals reviewed with patient? No  SHORT TERM GOALS: Target date: 06/13/2023  Patient will demonstrate undestanding of home exercise plan by performing exercises correctly with evidence of good carry over with min to no verbal or  tactile cues .   Baseline: NT 07/03/23: Performing independently  Goal status: ACHIEVED    LONG TERM GOALS: Target date: 08/08/2023  Patient will demonstrate in standing posture as evidenced by her ability to have 0 cm of space between her occiput and the wall with her heels touching the back of wall and mid back touching wall to provide additional support and offload ribs to decreased pain with thoracic activity.  Baseline: NT   Goal status: ONGOING   2.  Patient will improve her persicapular strength by >=1/3 MMT for improved posture and spinal and rib cage support to offload structures and reduce pain with activity like lifting required for job.  Baseline: Mid Trap R/L 4-/4-, Lower Trap R/L 4-/4-  Goal status: ONGOING     PLAN:  PT FREQUENCY: 1-2x/week  PT DURATION: 10 weeks  PLANNED INTERVENTIONS: 97164- PT Re-evaluation, 97110-Therapeutic exercises, 97530- Therapeutic activity, [id number]- Neuromuscular re-education, 97535- Self Care, [address]- Manual therapy, [id number]- Gait training, [phone]- Aquatic Therapy, 97014- Electrical stimulation (unattended), [phone]- Electrical stimulation (manual), Patient/Family education, Balance training, Stair training, Dry Needling, Joint mobilization, Joint manipulation, Spinal manipulation, Spinal mobilization, Cryotherapy, and Moist heat  PLAN FOR NEXT SESSION: Wall to Occiput Test. Try ice or TENS for pain modulation. Progress periscapular strengthening   Ellin Goodie PT, DPT  [facility] [address] [place], [address] Phone: [phone]   Fax:  [phone]

## 2023-07-04 ENCOUNTER — Telehealth: Payer: Self-pay | Admitting: Podiatry

## 2023-07-04 NOTE — Telephone Encounter (Signed)
 Patient would like to speak with nurse due to tingling and pain in left ankle. Patient contact telephone [phone]

## 2023-07-05 ENCOUNTER — Ambulatory Visit: Payer: 59 | Admitting: Physical Therapy

## 2023-07-10 ENCOUNTER — Ambulatory Visit: Payer: 59 | Admitting: Physical Therapy

## 2023-07-12 ENCOUNTER — Ambulatory Visit: Payer: 59 | Admitting: Physical Therapy

## 2023-07-13 ENCOUNTER — Encounter: Payer: Self-pay | Admitting: Family

## 2023-07-13 NOTE — Telephone Encounter (Signed)
 Spoke to pt and scheduled her with Dr Heide Spark for 07/16/23

## 2023-07-16 ENCOUNTER — Other Ambulatory Visit: Payer: Self-pay

## 2023-07-16 ENCOUNTER — Ambulatory Visit: Admitting: Internal Medicine

## 2023-07-16 DIAGNOSIS — R519 Headache, unspecified: Secondary | ICD-10-CM | POA: Insufficient documentation

## 2023-07-16 DIAGNOSIS — J45909 Unspecified asthma, uncomplicated: Secondary | ICD-10-CM | POA: Insufficient documentation

## 2023-07-16 LAB — URINALYSIS, ROUTINE W REFLEX MICROSCOPIC
Bilirubin Urine: NEGATIVE
Glucose, UA: NEGATIVE mg/dL
Hgb urine dipstick: NEGATIVE
Ketones, ur: NEGATIVE mg/dL
Leukocytes,Ua: NEGATIVE
Nitrite: NEGATIVE
Protein, ur: NEGATIVE mg/dL
Specific Gravity, Urine: 1.005 (ref 1.005–1.030)
pH: 8 (ref 5.0–8.0)

## 2023-07-16 LAB — COMPREHENSIVE METABOLIC PANEL WITH GFR
ALT: 20 U/L (ref 0–44)
AST: 20 U/L (ref 15–41)
Albumin: 4.1 g/dL (ref 3.5–5.0)
Alkaline Phosphatase: 54 U/L (ref 38–126)
Anion gap: 7 (ref 5–15)
BUN: 19 mg/dL (ref 6–20)
CO2: 26 mmol/L (ref 22–32)
Calcium: 9.6 mg/dL (ref 8.9–10.3)
Chloride: 106 mmol/L (ref 98–111)
Creatinine, Ser: 0.78 mg/dL (ref 0.44–1.00)
GFR, Estimated: >60 mL/min (ref >60)
Glucose, Bld: 98 mg/dL (ref 70–99)
Potassium: 3.7 mmol/L (ref 3.5–5.1)
Sodium: 139 mmol/L (ref 135–145)
Total Bilirubin: 0.4 mg/dL (ref 0.0–1.2)
Total Protein: 7.1 g/dL (ref 6.5–8.1)

## 2023-07-16 LAB — CBC WITH DIFFERENTIAL/PLATELET
Abs Immature Granulocytes: 0.01 10*3/uL (ref 0.00–0.07)
Basophils Absolute: 0 10*3/uL (ref 0.0–0.1)
Basophils Relative: 0 %
Eosinophils Absolute: 0 10*3/uL (ref 0.0–0.5)
Eosinophils Relative: 1 %
HCT: 40.2 % (ref 36.0–46.0)
Hemoglobin: 13.6 g/dL (ref 12.0–15.0)
Immature Granulocytes: 0 %
Lymphocytes Relative: 33 %
Lymphs Abs: 1.8 10*3/uL (ref 0.7–4.0)
MCH: 30.8 pg (ref 26.0–34.0)
MCHC: 33.8 g/dL (ref 30.0–36.0)
MCV: 91.2 fL (ref 80.0–100.0)
Monocytes Absolute: 0.5 10*3/uL (ref 0.1–1.0)
Monocytes Relative: 9 %
Neutro Abs: 3 10*3/uL (ref 1.7–7.7)
Neutrophils Relative %: 57 %
Platelets: 206 10*3/uL (ref 150–400)
RBC: 4.41 MIL/uL (ref 3.87–5.11)
RDW: 12.4 % (ref 11.5–15.5)
WBC: 5.3 10*3/uL (ref 4.0–10.5)
nRBC: 0 % (ref 0.0–0.2)

## 2023-07-16 LAB — POC URINE PREG, ED: Preg Test, Ur: NEGATIVE

## 2023-07-16 NOTE — ED Triage Notes (Signed)
 Pt arrives via POV with CC of headache for the last week. Pt also has had UTI and was taking antibiotic but switched and started cipro today.

## 2023-07-17 ENCOUNTER — Encounter: Payer: 59 | Admitting: Physical Therapy

## 2023-07-17 ENCOUNTER — Emergency Department
Admission: EM | Admit: 2023-07-17 | Discharge: 2023-07-17 | Disposition: A | Discharge status: Home / Self Care | Attending: Emergency Medicine | Admitting: Emergency Medicine

## 2023-07-17 DIAGNOSIS — R519 Headache, unspecified: Secondary | ICD-10-CM

## 2023-07-17 MED ORDER — PROCHLORPERAZINE MALEATE 10 MG PO TABS
10.0000 mg | ORAL_TABLET | Freq: Once | ORAL | Status: AC
  Administered 2023-07-17: 10 mg via ORAL
  Filled 2023-07-17: qty 1

## 2023-07-17 MED ORDER — PROCHLORPERAZINE MALEATE 10 MG PO TABS: 10.0000 mg | ORAL_TABLET | Freq: Four times a day (QID) | ORAL | 0 refills | Status: DC | PRN

## 2023-07-17 NOTE — ED Provider Notes (Signed)
 [facility] Provider Note    Event Date/Time   First MD Initiated Contact with Patient 07/17/23 0128     (approximate)   History   Headache   HPI  Cristina Wade is a 53 y.o. female who presents to the ED from home with a chief complaint of headache.  Patient with a history of frequent headaches, recent MRI done by neurologist Dr. Clelia Croft but she does not know the results yet.  Currently has a UTI, taking antibiotic but was switched and started Cipro yesterday.  Reports pain to her forehead.  Denies associated fever/chills, photophobia, vision changes, neck pain, chest pain, shortness of breath, abdominal pain, nausea, vomiting or dizziness.  Takes Imitrex as needed.     Past Medical History   Past Medical History:  Diagnosis Date   Anxiety    Asthma    Chronic pelvic pain in female 2018   Cystitis    Cystocele    Endometrial polyp    Endometriosis    per pt report but never seen during surg   Frequent headaches    Gastritis    GERD (gastroesophageal reflux disease)    rare   Gross hematuria    History of kidney stones    Microscopic hematuria    Migraine    Migraines    Neuropathy    Osteopenia    Urinary disorder    UTI (lower urinary tract infection)      Active Problem List   Patient Active Problem List   Diagnosis Date Noted   Bronchitis 04/17/2023   Palpitations 03/08/2023   Genetic testing 10/24/2022   Thoracic lymphadenopathy 09/12/2022   Family history of cancer 09/12/2022   Cecum mass 09/12/2022   Renal mass 09/06/2022   Upper GI bleeding 08/31/2022   Depression 08/31/2022   Chronic GERD 08/31/2022   Upper GI bleed 08/31/2022   Abnormal abdominal CT scan 08/31/2022   Subacute thyroiditis 03/08/2022   Thyroid antibody positive 03/08/2022   Epigastric pain 12/09/2021   Nausea 12/09/2021   Hyperthyroidism 12/09/2021   Neck pain 11/30/2021   Vasomotor flushing 11/30/2021   Chills (without fever) 04/15/2021   Dysuria  08/20/2020   Neurogenic bladder 08/20/2020   Pelvic pain in female 12/25/2019   Chronic low back pain 12/25/2019   Chronic venous insufficiency 12/24/2019   Lymphedema 12/24/2019   Lower extremity edema 12/03/2019   HLD (hyperlipidemia) 10/10/2019   Low back pain radiating to left leg 10/04/2019   SOB (shortness of breath) 09/20/2019   Orthopnea 09/20/2019   Chronic abdominal pain 05/14/2019   Irritable bowel syndrome with constipation 05/14/2019   Chronic constipation 12/25/2018   Urinary retention 08/12/2018   Anxiety 08/12/2018   Elevated blood pressure reading without diagnosis of hypertension 07/12/2018   Left leg pain 07/12/2018   Acute left-sided low back pain 06/03/2018   Cloudy urine 04/29/2018   Chronic chest pain 04/29/2018   Headache 04/27/2018   Localized, primary osteoarthritis 02/07/2018   Pelvic pain 01/30/2018   Slow transit constipation 10/10/2017   Bilateral impacted cerumen 11/28/2016   Chronic bilateral low back pain without sciatica 11/28/2016   Precordial pain 07/07/2016   Right bundle branch block (RBBB) on electrocardiogram (ECG) 07/07/2016   Chest tightness 06/27/2016   Preventative health care 09/30/2015   Encounter to establish care 09/28/2014   Vestibular migraine 09/28/2014   Routine physical examination 11/21/2013     Past Surgical History   Past Surgical History:  Procedure Laterality Date   COLONOSCOPY  N/A 11/05/2014   Procedure: COLONOSCOPY;  Surgeon: Scot Jun, MD;  Location: [facility] ENDOSCOPY;  Service: Endoscopy;  Laterality: N/A;   COLONOSCOPY  11/2016   Dr. Mechele Collin   CYSTOSCOPY  2007   with biopsy    CYSTOSCOPY N/A 06/01/2016   Procedure: CYSTOSCOPY;  Surgeon: Vena Austria, MD;  Location: [facility] ORS;  Service: Gynecology;  Laterality: N/A;   DIAGNOSTIC LAPAROSCOPY     DILATATION & CURETTAGE/HYSTEROSCOPY WITH MYOSURE N/A 04/13/2015   Procedure: DILATATION & CURETTAGE/HYSTEROSCOPY WITH MYOSURE/POLYPECTOMY;  Surgeon: Elenora Fender Ward, MD;  Location: [facility] ORS;  Service: Gynecology;  Laterality: N/A;   DILATATION & CURETTAGE/HYSTEROSCOPY WITH MYOSURE N/A 06/01/2016   Procedure: DILATATION & CURETTAGE/HYSTEROSCOPY WITH MYOSURE;  Surgeon: Vena Austria, MD;  Location: [facility] ORS;  Service: Gynecology;  Laterality: N/A;   DILATION AND CURETTAGE OF UTERUS     ESOPHAGOGASTRODUODENOSCOPY (EGD) WITH PROPOFOL N/A 08/31/2022   Procedure: ESOPHAGOGASTRODUODENOSCOPY (EGD) WITH PROPOFOL;  Surgeon: Jaynie Collins, DO;  Location: [facility] ENDOSCOPY;  Service: Gastroenterology;  Laterality: N/A;   GUM SURGERY     laproscopy  2007   POLYPECTOMY     endometrial     Home Medications   Prior to Admission medications   Medication Sig Start Date End Date Taking? Authorizing Provider  prochlorperazine (COMPAZINE) 10 MG tablet Take 1 tablet (10 mg total) by mouth every 6 (six) hours as needed for nausea (headache). 07/17/23  Yes Irean Hong, MD  Calcium Carbonate-Vit D-Min (CALCIUM 1200 PO) Take 2-3 tablets by mouth daily. Reported on 05/26/2015    [provider]  cholecalciferol (VITAMIN D3) 10 MCG (400 UNIT) TABS tablet Take 400 Units by mouth daily.    [provider]  LORazepam (ATIVAN) 0.5 MG tablet Take by mouth. 06/20/23   [provider]  Multiple Vitamin (MULTIVITAMIN ADULT PO) Take 1 tablet by mouth daily.    [provider]  pantoprazole (PROTONIX) 40 MG tablet Take 1 tablet (40 mg total) by mouth 2 (two) times daily before a meal. 08/31/22   Danford, Earl Lites, MD  polyethylene glycol (MIRALAX / GLYCOLAX) 17 g packet Take 17 g by mouth daily.    [provider]  Probiotic Product (PROBIOTIC DAILY PO) Take 1 tablet by mouth daily.    [provider]  psyllium (REGULOID) 0.52 g capsule Take 0.52 g by mouth daily.    [provider]  ZINC OXIDE PO Take 1 tablet by mouth daily.    [provider]     Allergies  Milk (cow), Milk-related compounds,  Omeprazole, and Penicillins   Family History   Family History  Problem Relation Age of Onset   Arthritis Mother    Heart disease Mother    Stroke Mother        TIA   Hypertension Mother    Diabetes Mother    Kidney cancer Mother 50   Heart failure Mother    Hyperlipidemia Mother    Colon cancer Father 25   Arthritis Father    Hyperlipidemia Father    Transient ischemic attack Father    Cancer - Colon Father 73   Prostate cancer Father 65   Valvular heart disease Father    Cancer Maternal Grandmother    Cancer Paternal Grandfather        prostate   Breast cancer Cousin 63       has contact     Physical Exam  Triage Vital Signs: ED Triage Vitals  Encounter Vitals Group  BP 07/16/23 2131 125/85     Systolic BP Percentile --      Diastolic BP Percentile --      Pulse Rate 07/16/23 2131 68     Resp 07/16/23 2131 19     Temp 07/16/23 2131 98.3 F (36.8 C)     Temp Source 07/16/23 2131 Oral     SpO2 07/16/23 2131 100 %     Weight 07/16/23 2132 128 lb (58.1 kg)     Height 07/16/23 2132 5\' 10"  (1.778 m)     Head Circumference --      Peak Flow --      Pain Score 07/16/23 2132 10     Pain Loc --      Pain Education --      Exclude from Growth Chart --     Updated Vital Signs: BP 123/83 (BP Location: Left Arm)   Pulse 70   Temp (!) 97.1 F (36.2 C) (Temporal)   Resp 19   Ht 5\' 10"  (1.778 m)   Wt 58.1 kg   LMP 11/03/2021 (Exact Date)   SpO2 100%   BMI 18.37 kg/m    General: Awake, no distress.  CV:  RRR.  Good peripheral perfusion.  Resp:  Normal effort.  CTAB. Abd:  No distention.  Other:  PERRL.  EOMI.  No carotid bruits.  Supple neck without meningismus.  Alert and oriented x 3.  CN II-XII grossly intact.  5 5/5 motor strength sensation all extremities.  MAE x 4.  No petechiae.   ED Results / Procedures / Treatments  Labs (all labs ordered are listed, but only abnormal results are displayed) Labs Reviewed  URINALYSIS, ROUTINE W REFLEX  MICROSCOPIC - Abnormal; Notable for the following components:      Result Value   Color, Urine STRAW (*)    APPearance CLEAR (*)    All other components within normal limits  CBC WITH DIFFERENTIAL/PLATELET  COMPREHENSIVE METABOLIC PANEL WITH GFR  POC URINE PREG, ED     EKG  None   RADIOLOGY None   Official radiology report(s): No results found.   PROCEDURES:  Critical Care performed: No  Procedures   MEDICATIONS ORDERED IN ED: Medications  prochlorperazine (COMPAZINE) tablet 10 mg (10 mg Oral Given 07/17/23 0216)     IMPRESSION / MDM / ASSESSMENT AND PLAN / ED COURSE  I reviewed the triage vital signs and the nursing notes.                             53 year old female presenting with mild headache. Differential diagnosis includes, but is not limited to, intracranial hemorrhage, meningitis/encephalitis, previous head trauma, cavernous venous thrombosis, tension headache, temporal arteritis, migraine or migraine equivalent, idiopathic intracranial hypertension, and non-specific headache.  I have personally reviewed patient's records and note her MRI brain and thoracic spine from 06/23/2023 and have shared these results with the patient.  Patient's presentation is most consistent with acute complicated illness / injury requiring diagnostic workup.  Laboratory and urinalysis results unremarkable.  No focal neurological deficits on examination.  Patient prefers pills versus IM or IV medications.  Will prescribe Compazine.  Patient will follow-up with her neurologist.  Strict return precautions given.  Patient verbalizes understanding and agrees with plan of care.  Clinical Course as of 07/17/23 0532  Tue Jul 17, 2023  0235 Patient feeling better.  Will discharge home on Compazine.  Remains neurologically intact without focal deficits.  Strict return precautions given.  Patient and family member verbalized understanding and agree with plan of care. [JS]    Clinical Course  User Index [JS] Irean Hong, MD     FINAL CLINICAL IMPRESSION(S) / ED DIAGNOSES   Final diagnoses:  Acute nonintractable headache, unspecified headache type     Rx / DC Orders   ED Discharge Orders          Ordered    prochlorperazine (COMPAZINE) 10 MG tablet  Every 6 hours PRN        07/17/23 0236             Note:  This document was prepared using Dragon voice recognition software and may include unintentional dictation errors.   Irean Hong, MD 07/17/23 [phone]

## 2023-07-17 NOTE — Discharge Instructions (Signed)
 You may take Compazine as needed for headaches.  Return to the ER for worsening symptoms, persistent vomiting, difficulty breathing or other concerns.

## 2023-07-18 ENCOUNTER — Ambulatory Visit: Admitting: Podiatry

## 2023-07-18 ENCOUNTER — Encounter: Payer: Self-pay | Admitting: Podiatry

## 2023-07-18 DIAGNOSIS — S93492A Sprain of other ligament of left ankle, initial encounter: Secondary | ICD-10-CM | POA: Diagnosis not present

## 2023-07-18 DIAGNOSIS — S86312A Strain of muscle(s) and tendon(s) of peroneal muscle group at lower leg level, left leg, initial encounter: Secondary | ICD-10-CM

## 2023-07-18 DIAGNOSIS — M25372 Other instability, left ankle: Secondary | ICD-10-CM | POA: Diagnosis not present

## 2023-07-18 DIAGNOSIS — S93412A Sprain of calcaneofibular ligament of left ankle, initial encounter: Secondary | ICD-10-CM

## 2023-07-18 NOTE — Progress Notes (Unsigned)
 07/19/23 3:15 PM   Cristina Wade Oct 31, 1970 [id number]  Referring provider:  [name], FNP [address] [place],  [place] [address]  Urological history  1. High risk hematuria -non-smoker -work up 2015 -bilateral punctate stones -cysto 2019 NED -non-contrast CT 2022 - bilateral punctate stones -cysto, 2022 - NED -RUS (08/2022) - bilateral renal stones -PET scan (09/2022) - NED -CTU (12/2022) -bilateral nephrolithiasis -RUS (06/2023) - bilateral non obstructing stones   2. Neurogenic bladder -UDS 2017 indicating nonobstructive urinary retention with poor bladder sensation and external sphincter dyssynergia -completed 12 weeks of PTNS -completed pelvic floor PT -manages with self cathing three times daily   3. Nephrolithiasis -non-contrast CT 2022 2 mm stone in the right kidney lower pole without hydronephrosis.  Three tiny stones in left kidney lower pole region without hydronephrosis -CTU (12/2022) -bilateral nephrolithiasis -RUS (06/2023) - bilateral non obstructing stones    4. rUTI's -contributing factors of age, incomplete emptying, vaginal atrophy, chronic constipation,  -documented urine cultures over the last year  July 10, 2023-enterococcal faecalis  June 21, 2023, Streptococcus mitis/oralis group   December 01, 2022, E. coli  November 26, 2022 - E. coli  December 01, 2022 - E.coli  October 11, 2022 - Citrobacter koseri   Aug 22, 2022 - E.coli  August 11, 2022 - E. Coli    Chief Complaint  Patient presents with   Urinary Tract Infection    HPI: Cristina Wade is a 53 y.o.female who presents today for follow with her sister, Cristina Braun.   Previous records reviewed.   She was seen at [facility] on July 10, 2023 for symptoms of headache, chills, fever, hot and cold flashes for 1 week.  She was also having elevated heart rate, lower back pain and bladder pain.  Her urine was sent for culture she grew out Enterococcus and placed on culture appropriate  antibiotics.  She has been taking the Cipro.  She still states she feels bladder pain.  She is also not getting a lot of urine out when she is cathing 3 times daily.  She states she is still seeing stool in her urine and sought this morning.  UA yellow clear, specific gravity 1.025, pH 6.5, WBC 0-5, 0-2 RBCs, 0-10 epithelial cells, amorphous sediment present and a few bacteria   PMH: Past Medical History:  Diagnosis Date   Anxiety    Asthma    Chronic pelvic pain in female 2018   Cystitis    Cystocele    Endometrial polyp    Endometriosis    per pt report but never seen during surg   Frequent headaches    Gastritis    GERD (gastroesophageal reflux disease)    rare   Gross hematuria    History of kidney stones    Microscopic hematuria    Migraine    Migraines    Neuropathy    Osteopenia    Urinary disorder    UTI (lower urinary tract infection)     Surgical History: Past Surgical History:  Procedure Laterality Date   COLONOSCOPY N/A 11/05/2014   Procedure: COLONOSCOPY;  Surgeon: Scot Jun, MD;  Location: [facility] ENDOSCOPY;  Service: Endoscopy;  Laterality: N/A;   COLONOSCOPY  11/2016   Dr. Mechele Collin   CYSTOSCOPY  2007   with biopsy    CYSTOSCOPY N/A 06/01/2016   Procedure: CYSTOSCOPY;  Surgeon: Vena Austria, MD;  Location: [facility] ORS;  Service: Gynecology;  Laterality: N/A;   DIAGNOSTIC LAPAROSCOPY  DILATATION & CURETTAGE/HYSTEROSCOPY WITH MYOSURE N/A 04/13/2015   Procedure: DILATATION & CURETTAGE/HYSTEROSCOPY WITH MYOSURE/POLYPECTOMY;  Surgeon: Elenora Fender Ward, MD;  Location: [facility] ORS;  Service: Gynecology;  Laterality: N/A;   DILATATION & CURETTAGE/HYSTEROSCOPY WITH MYOSURE N/A 06/01/2016   Procedure: DILATATION & CURETTAGE/HYSTEROSCOPY WITH MYOSURE;  Surgeon: Vena Austria, MD;  Location: [facility] ORS;  Service: Gynecology;  Laterality: N/A;   DILATION AND CURETTAGE OF UTERUS     ESOPHAGOGASTRODUODENOSCOPY (EGD) WITH PROPOFOL N/A 08/31/2022   Procedure:  ESOPHAGOGASTRODUODENOSCOPY (EGD) WITH PROPOFOL;  Surgeon: Jaynie Collins, DO;  Location: [facility] ENDOSCOPY;  Service: Gastroenterology;  Laterality: N/A;   GUM SURGERY     laproscopy  2007   POLYPECTOMY     endometrial    Home Medications:  Allergies as of 07/19/2023       Reactions   Milk (cow) Other (See Comments)   Milk-related Compounds    Omeprazole Other (See Comments)   Pt reports "Chest pain, HA and Dysuria." 01/05/22 patient states she is able to take omeprazole now without a reaction   Penicillins Itching, Rash        Medication List        Accurate as of July 19, 2023  3:15 PM. If you have any questions, ask your nurse or doctor.          CALCIUM 1200 PO Take 2-3 tablets by mouth daily. Reported on 05/26/2015   cholecalciferol 10 MCG (400 UNIT) Tabs tablet Commonly known as: VITAMIN D3 Take 400 Units by mouth daily.   ciprofloxacin 500 MG tablet Commonly known as: CIPRO Take 1 tablet by mouth 2 (two) times daily.   doxycycline 100 MG tablet Commonly known as: VIBRA-TABS Take by mouth.   LORazepam 0.5 MG tablet Commonly known as: ATIVAN Take by mouth.   MULTIVITAMIN ADULT PO Take 1 tablet by mouth daily.   pantoprazole 40 MG tablet Commonly known as: PROTONIX Take 1 tablet (40 mg total) by mouth 2 (two) times daily before a meal.   polyethylene glycol 17 g packet Commonly known as: MIRALAX / GLYCOLAX Take 17 g by mouth daily.   PROBIOTIC DAILY PO Take 1 tablet by mouth daily.   prochlorperazine 10 MG tablet Commonly known as: COMPAZINE Take 1 tablet (10 mg total) by mouth every 6 (six) hours as needed for nausea (headache).   psyllium 0.52 g capsule Commonly known as: REGULOID Take 0.52 g by mouth daily.   ZINC OXIDE PO Take 1 tablet by mouth daily.        Allergies:  Allergies  Allergen Reactions   Milk (Cow) Other (See Comments)   Milk-Related Compounds    Omeprazole Other (See Comments)    Pt reports "Chest pain, HA and  Dysuria." 01/05/22 patient states she is able to take omeprazole now without a reaction   Penicillins Itching and Rash    Family History: Family History  Problem Relation Age of Onset   Arthritis Mother    Heart disease Mother    Stroke Mother        TIA   Hypertension Mother    Diabetes Mother    Kidney cancer Mother 31   Heart failure Mother    Hyperlipidemia Mother    Colon cancer Father 27   Arthritis Father    Hyperlipidemia Father    Transient ischemic attack Father    Cancer - Colon Father 45   Prostate cancer Father 84   Valvular heart disease Father    Cancer Maternal Grandmother  Cancer Paternal Grandfather        prostate   Breast cancer Cousin 85       has contact    Social History:  reports that she has never smoked. She has never been exposed to tobacco smoke. She has never used smokeless tobacco. She reports that she does not drink alcohol and does not use drugs.   Physical Exam: BP 107/65   Pulse 73   Ht 5\' 10"  (1.778 m)   Wt 128 lb (58.1 kg)   LMP 11/03/2021 (Exact Date)   BMI 18.37 kg/m   Constitutional:  Well nourished. Alert and oriented, No acute distress. HEENT: [place] AT, moist mucus membranes.  Trachea midline Cardiovascular: No clubbing, cyanosis, or edema. Respiratory: Normal respiratory effort, no increased work of breathing. Neurologic: Grossly intact, no focal deficits, moving all 4 extremities. Psychiatric: Normal mood and affect.    Laboratory Data: Urinalysis See EPIC and HPI I have reviewed the labs.  See HPI.     Pertinent imaging N/A  Assessment & Plan:    1. High risk hematuria -non-smoker -work up x (2019, 2022)  - NED -CT urogram w/o worrisome GU findings -no reports of gross heme -UA w/o micro heme   2. Neurogenic bladder  -decrease CIC to 2 times daily   3. UTI -UA benign, but patient on Cipro -She reports stool in her urine and therefore with a history of recurrent UTIs and still with urine I went ahead and  schedule this CT cystogram to evaluate for possible colovesical fistula -She will return for report  Michiel Cowboy, PA-C   [facility] [address], [address] [place], [place] [address] [phone]

## 2023-07-18 NOTE — Progress Notes (Signed)
 Cristina Wade and her sister present today for follow-up of her peroneal tendinitis and her lateral ankle pain.  Since she injured this back in January with a inversion ankle sprain she is complained of pain.  She has been wearing her Tri-Lock ankle brace as well as her cam boot every day she has taken steroids nonsteroidals and had injections as well as physical therapy.  Nothing has alleviated her pain she states that I feel that is worse than it was before.  Objective: Vital signs are stable she is alert and oriented x 3.  Pulses are palpable.  She has severe pain on palpation of the peroneal tendons with fluid in the tendon sheaths.  She also has tenderness on palpation of the anterior talofibular ligament and calcaneofibular ligament with painful osseous palpation.  Assessment: Probable tear of the peroneal tendons as well as tear of the anterior talofibular ligament and calcaneofibular ligament.  Probable bone contusion.  Plan: Discussed etiology pathology conservative surgical therapies at this point due to failure of conservative therapies to alleviate symptoms for differential diagnosis as well as surgical planning.

## 2023-07-19 ENCOUNTER — Ambulatory Visit: Admitting: Urology

## 2023-07-19 ENCOUNTER — Encounter: Payer: Self-pay | Admitting: Urology

## 2023-07-19 ENCOUNTER — Ambulatory Visit: Payer: 59 | Admitting: Physical Therapy

## 2023-07-19 VITALS — BP 107/65 | HR 73 | Ht 70.0 in | Wt 128.0 lb

## 2023-07-19 DIAGNOSIS — N321 Vesicointestinal fistula: Secondary | ICD-10-CM

## 2023-07-19 DIAGNOSIS — N319 Neuromuscular dysfunction of bladder, unspecified: Secondary | ICD-10-CM

## 2023-07-19 DIAGNOSIS — N390 Urinary tract infection, site not specified: Secondary | ICD-10-CM | POA: Diagnosis not present

## 2023-07-19 DIAGNOSIS — R319 Hematuria, unspecified: Secondary | ICD-10-CM | POA: Diagnosis not present

## 2023-07-19 LAB — MICROSCOPIC EXAMINATION

## 2023-07-19 LAB — URINALYSIS, COMPLETE
Bilirubin, UA: NEGATIVE
Glucose, UA: NEGATIVE
Ketones, UA: NEGATIVE
Leukocytes,UA: NEGATIVE
Nitrite, UA: NEGATIVE
Protein,UA: NEGATIVE
RBC, UA: NEGATIVE
Specific Gravity, UA: 1.025 (ref 1.005–1.030)
Urobilinogen, Ur: 0.2 mg/dL (ref 0.2–1.0)
pH, UA: 6.5 (ref 5.0–7.5)

## 2023-07-20 ENCOUNTER — Ambulatory Visit: Admitting: Urology

## 2023-07-24 ENCOUNTER — Ambulatory Visit: Payer: 59 | Admitting: Physical Therapy

## 2023-07-26 ENCOUNTER — Ambulatory Visit: Payer: 59 | Admitting: Physical Therapy

## 2023-07-30 ENCOUNTER — Ambulatory Visit
Admission: RE | Admit: 2023-07-30 | Discharge: 2023-07-30 | Disposition: A | Discharge status: Home / Self Care | Source: Ambulatory Visit | Attending: Urology | Admitting: Urology

## 2023-07-30 DIAGNOSIS — R339 Retention of urine, unspecified: Secondary | ICD-10-CM | POA: Insufficient documentation

## 2023-07-30 DIAGNOSIS — N390 Urinary tract infection, site not specified: Secondary | ICD-10-CM | POA: Insufficient documentation

## 2023-07-30 DIAGNOSIS — N321 Vesicointestinal fistula: Secondary | ICD-10-CM | POA: Diagnosis not present

## 2023-07-30 DIAGNOSIS — N319 Neuromuscular dysfunction of bladder, unspecified: Secondary | ICD-10-CM | POA: Insufficient documentation

## 2023-07-30 DIAGNOSIS — N200 Calculus of kidney: Secondary | ICD-10-CM | POA: Diagnosis not present

## 2023-07-30 MED ORDER — IOHEXOL 300 MG/ML  SOLN
100.0000 mL | Freq: Once | INTRAMUSCULAR | Status: AC | PRN
  Administered 2023-07-30: 100 mL via INTRAVENOUS

## 2023-07-30 MED ORDER — IOTHALAMATE MEGLUMINE 17.2 % UR SOLN
250.0000 mL | Freq: Once | URETHRAL | Status: AC | PRN
Start: 2023-07-30 — End: 2023-07-30
  Administered 2023-07-30: 250 mL via INTRAVESICAL

## 2023-07-31 ENCOUNTER — Ambulatory Visit: Payer: 59 | Admitting: Physical Therapy

## 2023-08-02 ENCOUNTER — Ambulatory Visit: Payer: 59 | Admitting: Physical Therapy

## 2023-08-03 ENCOUNTER — Ambulatory Visit
Admission: RE | Admit: 2023-08-03 | Discharge: 2023-08-03 | Disposition: A | Discharge status: Home / Self Care | Source: Ambulatory Visit | Attending: Podiatry | Admitting: Podiatry

## 2023-08-03 DIAGNOSIS — S93412A Sprain of calcaneofibular ligament of left ankle, initial encounter: Secondary | ICD-10-CM

## 2023-08-03 DIAGNOSIS — S93492A Sprain of other ligament of left ankle, initial encounter: Secondary | ICD-10-CM

## 2023-08-03 DIAGNOSIS — M25372 Other instability, left ankle: Secondary | ICD-10-CM

## 2023-08-03 DIAGNOSIS — S86312A Strain of muscle(s) and tendon(s) of peroneal muscle group at lower leg level, left leg, initial encounter: Secondary | ICD-10-CM

## 2023-08-07 ENCOUNTER — Ambulatory Visit: Payer: 59 | Admitting: Physical Therapy

## 2023-08-09 ENCOUNTER — Ambulatory Visit: Payer: 59 | Admitting: Physical Therapy

## 2023-08-13 ENCOUNTER — Telehealth: Payer: Self-pay | Admitting: Physical Therapy

## 2023-08-13 ENCOUNTER — Ambulatory Visit: Payer: 59 | Admitting: Physical Therapy

## 2023-08-13 NOTE — Telephone Encounter (Signed)
 Discussed that ongoing pain is indicative of a pathology that is likely non-musculoskeletal in origin and she would likely benefit from further evaluation and treatment from her medical team. She has not attempted home exercises at this point, but she has attempted activity modifications such as reducing rotational movements, but this has not decreased her pain. PT to cancel the remainder of patients visits and she is being discharged.

## 2023-08-14 ENCOUNTER — Other Ambulatory Visit: Payer: Self-pay

## 2023-08-14 DIAGNOSIS — S93492A Sprain of other ligament of left ankle, initial encounter: Secondary | ICD-10-CM

## 2023-08-14 DIAGNOSIS — S93412A Sprain of calcaneofibular ligament of left ankle, initial encounter: Secondary | ICD-10-CM

## 2023-08-14 DIAGNOSIS — S86312A Strain of muscle(s) and tendon(s) of peroneal muscle group at lower leg level, left leg, initial encounter: Secondary | ICD-10-CM

## 2023-08-14 DIAGNOSIS — M25372 Other instability, left ankle: Secondary | ICD-10-CM

## 2023-08-15 ENCOUNTER — Ambulatory Visit: Payer: 59 | Admitting: Physical Therapy

## 2023-08-15 ENCOUNTER — Telehealth: Payer: Self-pay | Admitting: Family

## 2023-08-15 NOTE — Telephone Encounter (Signed)
 Call pt Rec'd note regarding back pain from physical therapy  Please sch appt to discuss

## 2023-08-15 NOTE — Telephone Encounter (Signed)
-----   Message from Benancio Bracket sent at 08/13/2023  4:25 PM EDT ----- Hello Dr. Eber Goldsmith,   Patient continues to experience pain localized over right that appears to be non-msk. I am referring her back to her medical team given that I have now tried various interventions including activity modification and exercises all of which have not decreased her pain or the incidence of her pain. Please let me know if you have any additional questions or concerns.   Thank you,   Marge Shed PT DPT

## 2023-08-16 ENCOUNTER — Ambulatory Visit: Admitting: Urology

## 2023-08-16 VITALS — BP 113/71 | HR 76

## 2023-08-16 DIAGNOSIS — N390 Urinary tract infection, site not specified: Secondary | ICD-10-CM | POA: Diagnosis not present

## 2023-08-16 DIAGNOSIS — N319 Neuromuscular dysfunction of bladder, unspecified: Secondary | ICD-10-CM | POA: Diagnosis not present

## 2023-08-16 DIAGNOSIS — R319 Hematuria, unspecified: Secondary | ICD-10-CM

## 2023-08-16 NOTE — Progress Notes (Unsigned)
 08/16/2023 7:58 PM   Cristina Wade [birth year] [id number]  Referring provider: Calista Catching, FNP [address] [place],  [place] [address]  Urological history: 1.  High risk hematuria - non-smoker - Workup (2015) - bilateral punctate stones - cysto (2019) - NED - non-contrast CT (2022) -punctate bilateral stones - cysto (2022) - NED - RUS (08/2022) - bilateral stones - PET scan ((09/2022) - NED - CTU (12/2022) - bilateral nephrolithiasis - RUS (06/2023) - bilateral nephrolithiasis  2. Neurogenic bladder -UDS 2017 indicating nonobstructive urinary retention with poor bladder sensation and external sphincter dyssynergia -completed 12 weeks of PTNS -completed pelvic floor PT -manages with self cathing three times daily  3. Nephrolithiasis -non-contrast CT 2022 2 mm stone in the right kidney lower pole without hydronephrosis.  Three tiny stones in left kidney lower pole region without hydronephrosis -CTU (12/2022) -bilateral nephrolithiasis -RUS (06/2023) - bilateral non obstructing stones   4. rUTI's - July 10, 2023-enterococcal faecalis - June 21, 2023, Streptococcus mitis/oralis group  - December 01, 2022, E. Coli - November 26, 2022 Ezekiel Hollingshead - December 01, 2022 - E.coli - October 11, 2022 - Citrobacter koseri       - Aug 22, 2022 - E.coli - August 11, 2022 - E. Coli  Chief Complaint  Patient presents with   Follow-up    CT results   HPI: Cristina Wade is a 53 y.o. woman who presents today to discuss her CT results with her sister, Cristina Wade.  Previous records reviewed.   She has a continued issue where she believes she is passing stool from her bladder when she urinates.  The stool that she is describing are very small 4 mm round balls of stool that she sees in the toilet after she urinates.  She does not see the structures when she catheterizes herself.  I obtained a CT cystogram for further evaluation and there was no finding of a colovesical fistula or  reflux.  She also feels that she may have a urinary tract infection today.  She states that she has had intermittent gross hematuria and intermittent malodorous urine.   Patient denies any modifying or aggravating factors.  Patient denies any dysuria or suprapubic/flank pain.  Patient denies any fevers, chills, nausea or vomiting.    UA yellow slightly cloudy, specific gravity 1.010, pH 7.5, trace leukocyte, 6-10 WBCs, 0-2 RBCs, 0-10 epithelial cells, amorphous sediment present, mucus present and bacteria present  PMH: Past Medical History:  Diagnosis Date   Anxiety    Asthma    Chronic pelvic pain in female 2018   Cystitis    Cystocele    Endometrial polyp    Endometriosis    per pt report but never seen during surg   Frequent headaches    Gastritis    GERD (gastroesophageal reflux disease)    rare   Gross hematuria    History of kidney stones    Microscopic hematuria    Migraine    Migraines    Neuropathy    Osteopenia    Urinary disorder    UTI (lower urinary tract infection)     Surgical History: Past Surgical History:  Procedure Laterality Date   COLONOSCOPY N/A 11/05/2014   Procedure: COLONOSCOPY;  Surgeon: Cassie Click, MD;  Location: [facility] ENDOSCOPY;  Service: Endoscopy;  Laterality: N/A;   COLONOSCOPY  11/2016   Dr. Felicita Horns   CYSTOSCOPY  2007   with biopsy    CYSTOSCOPY N/A 06/01/2016  Procedure: CYSTOSCOPY;  Surgeon: Darl Edu, MD;  Location: [facility] ORS;  Service: Gynecology;  Laterality: N/A;   DIAGNOSTIC LAPAROSCOPY     DILATATION & CURETTAGE/HYSTEROSCOPY WITH MYOSURE N/A 04/13/2015   Procedure: DILATATION & CURETTAGE/HYSTEROSCOPY WITH MYOSURE/POLYPECTOMY;  Surgeon: Margarie Shay Ward, MD;  Location: [facility] ORS;  Service: Gynecology;  Laterality: N/A;   DILATATION & CURETTAGE/HYSTEROSCOPY WITH MYOSURE N/A 06/01/2016   Procedure: DILATATION & CURETTAGE/HYSTEROSCOPY WITH MYOSURE;  Surgeon: Darl Edu, MD;  Location: [facility] ORS;  Service: Gynecology;   Laterality: N/A;   DILATION AND CURETTAGE OF UTERUS     ESOPHAGOGASTRODUODENOSCOPY (EGD) WITH PROPOFOL  N/A 08/31/2022   Procedure: ESOPHAGOGASTRODUODENOSCOPY (EGD) WITH PROPOFOL ;  Surgeon: Quintin Buckle, DO;  Location: [facility] ENDOSCOPY;  Service: Gastroenterology;  Laterality: N/A;   GUM SURGERY     laproscopy  2007   POLYPECTOMY     endometrial    Home Medications:  Allergies as of 08/16/2023       Reactions   Milk (cow) Other (See Comments)   Milk-related Compounds    Omeprazole  Other (See Comments)   Pt reports "Chest pain, HA and Dysuria." 01/05/22 patient states she is able to take omeprazole  now without a reaction   Penicillins Itching, Rash        Medication List        Accurate as of Aug 16, 2023 11:59 PM. If you have any questions, ask your nurse or doctor.          CALCIUM  1200 PO Take 2-3 tablets by mouth daily. Reported on 05/26/2015   cholecalciferol  10 MCG (400 UNIT) Tabs tablet Commonly known as: VITAMIN D3 Take 400 Units by mouth daily.   LORazepam 0.5 MG tablet Commonly known as: ATIVAN Take by mouth.   MULTIVITAMIN ADULT PO Take 1 tablet by mouth daily.   pantoprazole  40 MG tablet Commonly known as: PROTONIX  Take 1 tablet (40 mg total) by mouth 2 (two) times daily before a meal.   polyethylene glycol 17 g packet Commonly known as: MIRALAX  / GLYCOLAX  Take 17 g by mouth daily.   PROBIOTIC DAILY PO Take 1 tablet by mouth daily.   prochlorperazine  10 MG tablet Commonly known as: COMPAZINE  Take 1 tablet (10 mg total) by mouth every 6 (six) hours as needed for nausea (headache).   psyllium 0.52 g capsule Commonly known as: REGULOID Take 0.52 g by mouth daily.   ZINC  OXIDE PO Take 1 tablet by mouth daily.        Allergies:  Allergies  Allergen Reactions   Milk (Cow) Other (See Comments)   Milk-Related Compounds    Omeprazole  Other (See Comments)    Pt reports "Chest pain, HA and Dysuria." 01/05/22 patient states she is able to  take omeprazole  now without a reaction   Penicillins Itching and Rash    Family History: Family History  Problem Relation Age of Onset   Arthritis Mother    Heart disease Mother    Stroke Mother        TIA   Hypertension Mother    Diabetes Mother    Kidney cancer Mother 27   Heart failure Mother    Hyperlipidemia Mother    Colon cancer Father 4   Arthritis Father    Hyperlipidemia Father    Transient ischemic attack Father    Cancer - Colon Father 30   Prostate cancer Father 52   Valvular heart disease Father    Cancer Maternal Grandmother    Cancer Paternal Grandfather  prostate   Breast cancer Cousin 35       has contact    Social History:  reports that she has never smoked. She has never been exposed to tobacco smoke. She has never used smokeless tobacco. She reports that she does not drink alcohol and does not use drugs.  ROS: Pertinent ROS in HPI  Physical Exam: BP 113/71   Pulse 76   LMP 11/03/2021 (Exact Date)   Constitutional:  Well nourished. Alert and oriented, No acute distress. HEENT: [place] AT, moist mucus membranes.  Trachea midline, no masses. Cardiovascular: No clubbing, cyanosis, or edema. Respiratory: Normal respiratory effort, no increased work of breathing. Neurologic: Grossly intact, no focal deficits, moving all 4 extremities. Psychiatric: Normal mood and affect.  Laboratory Data: Lab Results  Component Value Date   WBC 5.3 07/16/2023   HGB 13.6 07/16/2023   HCT 40.2 07/16/2023   MCV 91.2 07/16/2023   PLT 206 07/16/2023    Lab Results  Component Value Date   CREATININE 0.78 07/16/2023    Lab Results  Component Value Date   TSH 1.31 05/18/2023       Component Value Date/Time   CHOL 175 11/22/2022 1101   HDL 76.70 11/22/2022 1101   CHOLHDL 2 11/22/2022 1101   VLDL 7.4 11/22/2022 1101   LDLCALC 91 11/22/2022 1101    Lab Results  Component Value Date   AST 20 07/16/2023   Lab Results  Component Value Date   ALT 20  07/16/2023    Urinalysis See EPIC and HPI  I have reviewed the labs.   Pertinent Imaging: On the CT cystogram I did not see evidence of a colovesical fistula, radiologist interpretation still pending I have independently reviewed the films.  See HPI.  Assessment & Plan:    1. Recurrent UTI (Primary) - Urinalysis, Complete - Benign - urine culture pending - This is a very difficult situation as the patient and her sister have poor health literacy and have a difficult time understanding the difference between chronic colonization and true UTIs, this results in numerous phone calls and office visits and also antibiotic prescriptions as we cannot come to an understanding that is satisfactory for her - She had recently become concerned because she feels she is passing stool in her urine, but I have not been able to find evidence of this and with the cystogram being negative for colovesical fistula I explained that I do not have an explanation for what she is experiencing when she is voiding spontaneously - I did explain that the radiologist interpretation is still pending and I will reach out to her with those results - Will not prescribe antibiotic until urine culture results are available  2. Neurogenic bladder - Continue self-catheterization 2-3 times daily  3. High risk hematuria - today's UA negative for hematuria - will continue to monitor  Return for pending urine culture results .  These notes generated with voice recognition software. I apologize for typographical errors.  Cristina Wade  [facility] [address]  [address] [place], [place] [address] [phone]

## 2023-08-16 NOTE — Telephone Encounter (Signed)
 LVM  to have pt schedule appt to discuss back pain. Please schedule when pt calls back

## 2023-08-17 LAB — MICROSCOPIC EXAMINATION

## 2023-08-17 LAB — URINALYSIS, COMPLETE
Bilirubin, UA: NEGATIVE
Glucose, UA: NEGATIVE
Ketones, UA: NEGATIVE
Nitrite, UA: NEGATIVE
Protein,UA: NEGATIVE
RBC, UA: NEGATIVE
Specific Gravity, UA: 1.01 (ref 1.005–1.030)
Urobilinogen, Ur: 0.2 mg/dL (ref 0.2–1.0)
pH, UA: 7.5 (ref 5.0–7.5)

## 2023-08-17 NOTE — Telephone Encounter (Signed)
 Copied from CRM [id number]. Topic: General - Other >> Aug 17, 2023  8:36 AM Kita Perish H wrote: Reason for CRM: Patient returned call to [place], advised patient per note to schedule appointment to discuss back pain, patient denied scheduling and stated she's having issues with her ankle, please reach out, thanks.  Loneta [phone]

## 2023-08-17 NOTE — Telephone Encounter (Signed)
 LVM and sent my chart message as well to call back to  schedule appt for ankle pain

## 2023-08-19 ENCOUNTER — Encounter: Payer: Self-pay | Admitting: Urology

## 2023-08-20 ENCOUNTER — Encounter: Payer: Self-pay | Admitting: Podiatry

## 2023-08-20 ENCOUNTER — Telehealth: Payer: Self-pay | Admitting: Podiatry

## 2023-08-20 ENCOUNTER — Ambulatory Visit: Payer: 59 | Admitting: Physical Therapy

## 2023-08-20 DIAGNOSIS — S93492D Sprain of other ligament of left ankle, subsequent encounter: Secondary | ICD-10-CM

## 2023-08-20 DIAGNOSIS — M25372 Other instability, left ankle: Secondary | ICD-10-CM

## 2023-08-20 DIAGNOSIS — S93412D Sprain of calcaneofibular ligament of left ankle, subsequent encounter: Secondary | ICD-10-CM

## 2023-08-20 DIAGNOSIS — S86312D Strain of muscle(s) and tendon(s) of peroneal muscle group at lower leg level, left leg, subsequent encounter: Secondary | ICD-10-CM

## 2023-08-20 NOTE — Telephone Encounter (Signed)
 Spoke to pt she stated Problem has been resolved pt does not need an in office appt she will call if she needs to schedule

## 2023-08-20 NOTE — Telephone Encounter (Signed)
 Patient is requesting a note to wear boot at work with no restrictions and would like referral sent to [facility] for physical therapy. Patient contact telephone number, [phone]

## 2023-08-21 ENCOUNTER — Ambulatory Visit: Payer: 59 | Admitting: Physical Therapy

## 2023-08-22 ENCOUNTER — Other Ambulatory Visit: Payer: Self-pay | Admitting: Urology

## 2023-08-22 ENCOUNTER — Ambulatory Visit: Admitting: Podiatry

## 2023-08-22 LAB — CULTURE, URINE COMPREHENSIVE

## 2023-08-22 MED ORDER — CIPROFLOXACIN HCL 500 MG PO TABS: 500.0000 mg | ORAL_TABLET | Freq: Two times a day (BID) | ORAL | 0 refills | Status: AC

## 2023-08-23 ENCOUNTER — Ambulatory Visit: Payer: 59 | Admitting: Physical Therapy

## 2023-08-28 ENCOUNTER — Encounter: Payer: 59 | Admitting: Physical Therapy

## 2023-08-30 ENCOUNTER — Ambulatory Visit (INDEPENDENT_AMBULATORY_CARE_PROVIDER_SITE_OTHER): Admitting: Urology

## 2023-08-30 ENCOUNTER — Encounter: Payer: Self-pay | Admitting: Urology

## 2023-08-30 ENCOUNTER — Encounter: Payer: 59 | Admitting: Physical Therapy

## 2023-08-30 ENCOUNTER — Other Ambulatory Visit: Payer: Self-pay | Admitting: Gastroenterology

## 2023-08-30 VITALS — BP 100/63 | HR 68 | Ht 70.0 in | Wt 128.0 lb

## 2023-08-30 DIAGNOSIS — G8929 Other chronic pain: Secondary | ICD-10-CM

## 2023-08-30 DIAGNOSIS — N319 Neuromuscular dysfunction of bladder, unspecified: Secondary | ICD-10-CM | POA: Diagnosis not present

## 2023-08-30 DIAGNOSIS — R319 Hematuria, unspecified: Secondary | ICD-10-CM

## 2023-08-30 DIAGNOSIS — N820 Vesicovaginal fistula: Secondary | ICD-10-CM

## 2023-08-30 DIAGNOSIS — R102 Pelvic and perineal pain: Secondary | ICD-10-CM

## 2023-08-30 NOTE — Progress Notes (Addendum)
 08/30/2023 2:57 PM   Cristina Wade Nov 21, 1970 [id number]  Referring provider: Calista Catching, FNP [address] [place],  [place] [address]  Urological history: 1.  High risk hematuria - non-smoker - Workup (2015) - bilateral punctate stones - cysto (2019) - NED - non-contrast CT (2022) -punctate bilateral stones - cysto (2022) - NED - RUS (08/2022) - bilateral stones - PET scan ((09/2022) - NED - CTU (12/2022) - bilateral nephrolithiasis - RUS (06/2023) - bilateral nephrolithiasis  2. Neurogenic bladder -UDS 2017 indicating nonobstructive urinary retention with poor bladder sensation and external sphincter dyssynergia -completed 12 weeks of PTNS -completed pelvic floor PT -manages with self cathing three times daily  3. Nephrolithiasis -non-contrast CT 2022 2 mm stone in the right kidney lower pole without hydronephrosis.  Three tiny stones in left kidney lower pole region without hydronephrosis -CTU (12/2022) -bilateral nephrolithiasis -RUS (06/2023) - bilateral non obstructing stones   4. rUTI's - Aug 16, 2023, Enterococcus faecalis - July 10, 2023-enterococcal faecalis - June 21, 2023, Streptococcus mitis/oralis group  - December 01, 2022, E. Coli - November 26, 2022 - E. coli - December 01, 2022 - E.coli - October 11, 2022 - Citrobacter koseri        Chief Complaint  Patient presents with   Neurogenic bladder   HPI: Cristina Wade is a 53 y.o. woman who presents today for hurting and cramping in her bladder with her sister, Mariah Shines.  Previous records reviewed.   She states she has been having cramping and pain in her suprapubic region and she has noted a vaginal discharge that started today.  She denies any vaginal itching or burning.  She describes the discharge as whitish.  She feels she is not having a lot of urinary output.  She denies any further gross hematuria since the treating of her infection.  Patient denies any modifying or aggravating factors.   Patient denies any recent UTI's, gross hematuria, dysuria or suprapubic/flank pain.  Patient denies any fevers, chills, nausea or vomiting.    She also has a pelvic MRI with and without contrast ordered by another provider for further evaluation of her symptoms.  CATH UA yellow clear, specific gravity 1.015, pH 6.5, trace heme, 0-5 WBCs, 0-2 RBCs, 0-10 epithelial cells, mucus at present and a few bacteria.  PMH: Past Medical History:  Diagnosis Date   Anxiety    Asthma    Chronic pelvic pain in female 2018   Cystitis    Cystocele    Endometrial polyp    Endometriosis    per pt report but never seen during surg   Frequent headaches    Gastritis    GERD (gastroesophageal reflux disease)    rare   Gross hematuria    History of kidney stones    Microscopic hematuria    Migraine    Migraines    Neuropathy    Osteopenia    Urinary disorder    UTI (lower urinary tract infection)     Surgical History: Past Surgical History:  Procedure Laterality Date   COLONOSCOPY N/A 11/05/2014   Procedure: COLONOSCOPY;  Surgeon: Cassie Click, MD;  Location: [facility] ENDOSCOPY;  Service: Endoscopy;  Laterality: N/A;   COLONOSCOPY  11/2016   Dr. Felicita Horns   CYSTOSCOPY  2007   with biopsy    CYSTOSCOPY N/A 06/01/2016   Procedure: CYSTOSCOPY;  Surgeon: Darl Edu, MD;  Location: [facility] ORS;  Service: Gynecology;  Laterality: N/A;   DIAGNOSTIC LAPAROSCOPY  DILATATION & CURETTAGE/HYSTEROSCOPY WITH MYOSURE N/A 04/13/2015   Procedure: DILATATION & CURETTAGE/HYSTEROSCOPY WITH MYOSURE/POLYPECTOMY;  Surgeon: Margarie Shay Ward, MD;  Location: [facility] ORS;  Service: Gynecology;  Laterality: N/A;   DILATATION & CURETTAGE/HYSTEROSCOPY WITH MYOSURE N/A 06/01/2016   Procedure: DILATATION & CURETTAGE/HYSTEROSCOPY WITH MYOSURE;  Surgeon: Darl Edu, MD;  Location: [facility] ORS;  Service: Gynecology;  Laterality: N/A;   DILATION AND CURETTAGE OF UTERUS     ESOPHAGOGASTRODUODENOSCOPY (EGD) WITH PROPOFOL  N/A  08/31/2022   Procedure: ESOPHAGOGASTRODUODENOSCOPY (EGD) WITH PROPOFOL ;  Surgeon: Quintin Buckle, DO;  Location: [facility] ENDOSCOPY;  Service: Gastroenterology;  Laterality: N/A;   GUM SURGERY     laproscopy  2007   POLYPECTOMY     endometrial    Home Medications:  Allergies as of 08/30/2023       Reactions   Milk (cow) Other (See Comments)   Milk-related Compounds    Omeprazole  Other (See Comments)   Pt reports "Chest pain, HA and Dysuria." 01/05/22 patient states she is able to take omeprazole  now without a reaction   Penicillins Itching, Rash        Medication List        Accurate as of Aug 30, 2023  2:57 PM. If you have any questions, ask your nurse or doctor.          CALCIUM  1200 PO Take 2-3 tablets by mouth daily. Reported on 05/26/2015   cholecalciferol  10 MCG (400 UNIT) Tabs tablet Commonly known as: VITAMIN D3 Take 400 Units by mouth daily.   LORazepam 0.5 MG tablet Commonly known as: ATIVAN Take by mouth.   MULTIVITAMIN ADULT PO Take 1 tablet by mouth daily.   pantoprazole  40 MG tablet Commonly known as: PROTONIX  Take 1 tablet (40 mg total) by mouth 2 (two) times daily before a meal.   polyethylene glycol 17 g packet Commonly known as: MIRALAX  / GLYCOLAX  Take 17 g by mouth daily.   PROBIOTIC DAILY PO Take 1 tablet by mouth daily.   prochlorperazine  10 MG tablet Commonly known as: COMPAZINE  Take 1 tablet (10 mg total) by mouth every 6 (six) hours as needed for nausea (headache).   psyllium 0.52 g capsule Commonly known as: REGULOID Take 0.52 g by mouth daily.   ZINC  OXIDE PO Take 1 tablet by mouth daily.        Allergies:  Allergies  Allergen Reactions   Milk (Cow) Other (See Comments)   Milk-Related Compounds    Omeprazole  Other (See Comments)    Pt reports "Chest pain, HA and Dysuria." 01/05/22 patient states she is able to take omeprazole  now without a reaction   Penicillins Itching and Rash    Family History: Family  History  Problem Relation Age of Onset   Arthritis Mother    Heart disease Mother    Stroke Mother        TIA   Hypertension Mother    Diabetes Mother    Kidney cancer Mother 21   Heart failure Mother    Hyperlipidemia Mother    Colon cancer Father 54   Arthritis Father    Hyperlipidemia Father    Transient ischemic attack Father    Cancer - Colon Father 54   Prostate cancer Father 79   Valvular heart disease Father    Cancer Maternal Grandmother    Cancer Paternal Grandfather        prostate   Breast cancer Cousin 41       has contact    Social History:  reports that she has never smoked. She has never been exposed to tobacco smoke. She has never used smokeless tobacco. She reports that she does not drink alcohol and does not use drugs.  ROS: Pertinent ROS in HPI  Physical Exam: BP 100/63   Pulse 68   Ht 5\' 10"  (1.778 m)   Wt 128 lb (58.1 kg)   LMP 11/03/2021 (Exact Date)   BMI 18.37 kg/m   Constitutional:  Well nourished. Alert and oriented, No acute distress. HEENT: [place] AT, moist mucus membranes.  Trachea midline Cardiovascular: No clubbing, cyanosis, or edema. Respiratory: Normal respiratory effort, no increased work of breathing. GU: No CVA tenderness.  No bladder fullness or masses.  Normal external genitalia, normal urethral meatus, no lesions, no prolapse, no discharge.   No urethral masses, tenderness and/or tenderness.   No bladder fullness, tenderness or masses.  Normal vagina mucosa, good estrogen effect, no discharge, no lesions, good pelvic support, no cystocele and no rectocele noted.  Anus and perineum are without rashes or lesions.    Neurologic: Grossly intact, no focal deficits, moving all 4 extremities. Psychiatric: Normal mood and affect.    Laboratory Data: Urinalysis See EPIC and HPI  I have reviewed the labs.   Pertinent Imaging: N/A   Simple Catheter Placement Due to urinary retention patient is present today for a foley cath placement.   Patient was cleaned and prepped in a sterile fashion with betadine  and 2% lidocaine  jelly was instilled into the urethra. A 16 FR foley catheter was inserted, urine return was noted  300 ml, urine was yellow in color.  The balloon was filled with 10cc of sterile water.  A leg bag was attached for drainage. Patient was given instruction on proper catheter care.  Patient tolerated well, no complications were noted   Performed by: Matilde Son, PA-C and Jenny Mohs, CMA  Assessment & Plan:    1.  Neurogenic bladder - Will place an indwelling Foley at this time to help record urinary output and see if continuous bladder decompression will ease some of her symptoms - She will wear the catheter over the weekend and record her output and then she will pull it out herself Monday morning - If she has any issues with the catheter she will come Monday afternoon for assistance  2. Chronic pelvic pain - Will place indwelling Foley at this time to see if continuous bladder decompression will be some of her pelvic pain - Her PVRs have been variable over the last several office visits, and she is seeing tiny black balls in her urine, so we may be able to capture some of them to determine what they are stool, clots, or stones   3. High risk hematuria - today's CATH UA negative for hematuria - will continue to monitor  Return for collect log book .  These notes generated with voice recognition software. I apologize for typographical errors.  Briant Camper  [facility] [address]  [address] [place], [place] [address] [phone]

## 2023-08-31 ENCOUNTER — Telehealth: Payer: Self-pay

## 2023-08-31 LAB — URINALYSIS, COMPLETE
Bilirubin, UA: NEGATIVE
Glucose, UA: NEGATIVE
Ketones, UA: NEGATIVE
Leukocytes,UA: NEGATIVE
Nitrite, UA: NEGATIVE
Protein,UA: NEGATIVE
Specific Gravity, UA: 1.015 (ref 1.005–1.030)
Urobilinogen, Ur: 0.2 mg/dL (ref 0.2–1.0)
pH, UA: 6.5 (ref 5.0–7.5)

## 2023-08-31 LAB — MICROSCOPIC EXAMINATION

## 2023-08-31 NOTE — Telephone Encounter (Signed)
 Pt called triage line stating that her catheter was leaking. Pt states it's coming from the top of her catheter. Pt was informed that it sounds like a bladder spasm. Per Matilde Son, PA, pt was informed to remove cath. Pt states she knows how to remove cath and that she was given a syringe to remove it. Pt voiced understanding and was told we would call with MRI results.

## 2023-09-03 ENCOUNTER — Ambulatory Visit: Admitting: Urology

## 2023-09-04 ENCOUNTER — Encounter (INDEPENDENT_AMBULATORY_CARE_PROVIDER_SITE_OTHER): Payer: Self-pay

## 2023-09-04 ENCOUNTER — Encounter: Payer: 59 | Admitting: Physical Therapy

## 2023-09-04 ENCOUNTER — Ambulatory Visit
Admission: RE | Admit: 2023-09-04 | Discharge: 2023-09-04 | Disposition: A | Discharge status: Home / Self Care | Source: Ambulatory Visit | Attending: Gastroenterology | Admitting: Gastroenterology

## 2023-09-04 DIAGNOSIS — N820 Vesicovaginal fistula: Secondary | ICD-10-CM | POA: Diagnosis present

## 2023-09-04 LAB — CULTURE, URINE COMPREHENSIVE

## 2023-09-04 MED ORDER — GADOBUTROL 1 MMOL/ML IV SOLN
5.0000 mL | Freq: Once | INTRAVENOUS | Status: AC | PRN
  Administered 2023-09-04: 5 mL via INTRAVENOUS

## 2023-09-05 ENCOUNTER — Ambulatory Visit

## 2023-09-27 ENCOUNTER — Other Ambulatory Visit: Payer: Self-pay | Admitting: Obstetrics and Gynecology

## 2023-09-27 ENCOUNTER — Encounter: Payer: Self-pay | Admitting: Family

## 2023-09-27 DIAGNOSIS — Z1231 Encounter for screening mammogram for malignant neoplasm of breast: Secondary | ICD-10-CM

## 2023-10-01 ENCOUNTER — Ambulatory Visit (INDEPENDENT_AMBULATORY_CARE_PROVIDER_SITE_OTHER): Admitting: Podiatry

## 2023-10-01 ENCOUNTER — Encounter: Payer: Self-pay | Admitting: Podiatry

## 2023-10-01 DIAGNOSIS — S86312D Strain of muscle(s) and tendon(s) of peroneal muscle group at lower leg level, left leg, subsequent encounter: Secondary | ICD-10-CM | POA: Diagnosis not present

## 2023-10-01 DIAGNOSIS — M722 Plantar fascial fibromatosis: Secondary | ICD-10-CM | POA: Diagnosis not present

## 2023-10-01 NOTE — Progress Notes (Signed)
 She presents today for follow-up of her painful left foot.  She states that it is not as bad as it was while I was standing on it at work but is still there and I am afraid of putting it off any longer.  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  Denies any changes in her past medical history medications allergies surgeries and social history.  Objective: Vital signs are stable alert oriented x 3 pulses are palpable.  She has pain and fluctuance on palpation of the peroneal tendons she has tenderness on palpation of the sinus tarsi and of the plantar fascia.  No pain on palpation of the Achilles or her calf or on in range of motion of any of her forefoot joints.  Radiographs and MRI were reviewed.  MRI is consistent with peroneal tendon tear and sinus tarsitis.  Assessment: Tear of the peroneal tendons capsulitis of the subtalar joint and chronic proximal plantar fasciitis left.  Plan: Discussed etiology pathology conservative versus surgical therapies.  At this point scheduled her for surgical intervention.  She was consented for a PRP injection of the sinus tarsi primary repair peroneal tendons.  Endoscopic plantar fasciotomy.  Cast application all on the left foot.  We discussed in great detail today the fact that she will be in a cast for 6 weeks and then a cam boot.  She understands this is amendable to it and we once again went over the possible side effects and complications associated with surgery such as this which may include but are not limited to postop pain bleeding swelling infection recurrence need for further surgery overcorrection under correction also digit loss limb loss of life loss of nerve function.  We did discuss the surgery center anesthesia group and timeliness of the surgery.  I will follow-up with her in the near future for surgical intervention.

## 2023-10-03 ENCOUNTER — Encounter: Payer: Self-pay | Admitting: Internal Medicine

## 2023-10-03 ENCOUNTER — Ambulatory Visit: Payer: 59 | Attending: Internal Medicine | Admitting: Internal Medicine

## 2023-10-03 VITALS — BP 106/58 | HR 62 | Ht 70.0 in | Wt 122.4 lb

## 2023-10-03 DIAGNOSIS — R42 Dizziness and giddiness: Secondary | ICD-10-CM | POA: Diagnosis not present

## 2023-10-03 DIAGNOSIS — R0789 Other chest pain: Secondary | ICD-10-CM

## 2023-10-03 DIAGNOSIS — R0609 Other forms of dyspnea: Secondary | ICD-10-CM

## 2023-10-03 DIAGNOSIS — R002 Palpitations: Secondary | ICD-10-CM

## 2023-10-03 MED ORDER — NAPROXEN 250 MG PO TABS
250.0000 mg | ORAL_TABLET | Freq: Two times a day (BID) | ORAL | 1 refills | Status: DC | PRN
Start: 2023-10-03 — End: 2023-12-07

## 2023-10-03 NOTE — Patient Instructions (Signed)
 Medication Instructions:  Your physician recommends the following medication changes.  START TAKING: Naproxen  250 mg by mouth twice daily as needed for chest wall pain  *If you need a refill on your cardiac medications before your next appointment, please call your pharmacy*  Lab Work: No labs ordered today   Testing/Procedures: Your physician has requested that you have an echocardiogram. Echocardiography is a painless test that uses sound waves to create images of your heart. It provides your doctor with information about the size and shape of your heart and how well your heart's chambers and valves are working.   You may receive an ultrasound enhancing agent through an IV if needed to better visualize your heart during the echo. This procedure takes approximately one hour.  There are no restrictions for this procedure.  This will take place at [address] [facility]) [address], [place] [address]  Please note: We ask at that you not bring children with you during ultrasound (echo/ vascular) testing. Due to room size and safety concerns, children are not allowed in the ultrasound rooms during exams. Our front office staff cannot provide observation of children in our lobby area while testing is being conducted. An adult accompanying a patient to their appointment will only be allowed in the ultrasound room at the discretion of the ultrasound technician under special circumstances. We apologize for any inconvenience.   Follow-Up: At [facility], you and your health needs are our priority.  As part of our continuing mission to provide you with exceptional heart care, our providers are all part of one team.  This team includes your primary Cardiologist (physician) and Advanced Practice Providers or APPs (Physician Assistants and Nurse Practitioners) who all work together to provide you with the care you need, when you need it.  Your next appointment:   6  month(s)  Provider:   You may see Sammy Crisp, MD or one of the following Advanced Practice Providers on your designated Care Team:   Laneta Pintos, NP Gildardo Labrador, PA-C Varney Gentleman, PA-C Cadence Dillon Beach, PA-C Ronald Cockayne, NP Morey Ar, NP

## 2023-10-03 NOTE — Progress Notes (Signed)
 Cardiology Office Note:  .   Date:  10/03/2023  ID:  Cristina Wade, DOB 07-Jan-1971, MRN [id number] PCP: Calista Catching, FNP  [facility] Providers Cardiologist:  Sammy Crisp, MD     History of Present Illness: .   Cristina Wade is a 52 y.o. female with recurrent chronic chest pain with normal coronary arteries by CTA, asthma, GERD, and migraine headaches, who presents for follow-up of chest pain.  I last saw her in 05/2023, at which time she reported sporadic chest pain.  She noted that her physical therapist had recently suggested that the discomfort could be due to costochondritis.  I encouraged her to keep using as needed NSAIDs for symptomatic relief.  She also reported occasional palpitations with prior event monitor showing brief supraventricular runs.  We discussed addition of a beta-blocker but agreed to defer this given relatively mild symptoms and low normal BP.  Today, Cristina Wade has multiple concerns.  She continues to have some chest pain, primarily along the left side of the sternum that is tender to palpation.  She notes that it has improved in the past with prednisone .  She is not taking anything for currently but is concerned that as she begins doing more heavy lifting again that it will worsen.  She also notes some progressive shortness of breath with activity over the last several months.  She denies edema.  She occasionally gets lightheaded, which her neurologist has attributed to soft blood pressure.  She tries to stay well-hydrated with water.  She also continues to have sporadic palpitations that are brief and self-limited.  ROS: See HPI  Studies Reviewed: Aaron Aas   EKG Interpretation Date/Time:  Wednesday October 03 2023 10:32:02 EDT Ventricular Rate:  62 PR Interval:  144 QRS Duration:  90 QT Interval:  422 QTC Calculation: 428 R Axis:   92  Text Interpretation: Normal sinus rhythm Rightward axis Borderline ECG When compared with ECG of 11-Jun-2023 14:37, No  significant change was found Confirmed by De Libman, Veryl Gottron [phone]) on 10/03/2023 1:03:25 PM    Event monitor (04/03/2023): Predominantly sinus rhythm with rare PACs and PVCs as well as a few brief supraventricular runs lasting up to 10 beats.   Risk Assessment/Calculations:             Physical Exam:   VS:  BP (!) 106/58 (BP Location: Left Arm, Patient Position: Sitting, Cuff Size: Normal)   Pulse 62   Ht 5' 10 (1.778 m)   Wt 122 lb 6.4 oz (55.5 kg)   LMP 11/03/2021 (Exact Date)   SpO2 97%   BMI 17.56 kg/m    Wt Readings from Last 3 Encounters:  10/03/23 122 lb 6.4 oz (55.5 kg)  08/30/23 128 lb (58.1 kg)  07/19/23 128 lb (58.1 kg)    General:  NAD. Neck: No JVD or HJR. Lungs: Clear to auscultation bilaterally without wheezes or crackles. Heart: Regular rate and rhythm without murmurs, rubs, or gallops. Abdomen: Soft, nontender, nondistended. Extremities: No lower extremity edema.  ASSESSMENT AND PLAN: .    Chest wall pain: Most likely musculoskeletal.  I will prescribe a course of naproxen  250 mg twice daily as needed for pain.  Continue follow-up with Cristina Wade as well.  No plans for repeat ischemia evaluation given noncardiac nature of the pain and reassuring coronary CTA in 2021.  Dyspnea on exertion: This has been a chronic issue but seems to have worsened in the last few months.  Query if deconditioning is playing a role  given ankle injury that has limited mobility somewhat.  Cristina Wade continues to try to walk when possible.  She has a history of grade 2 diastolic dysfunction by echo though she appears euvolemic on exam today.  We have agreed to repeat an echocardiogram at her convenience to ensure that she does not have any new abnormalities.  Lightheadedness: Blood pressure low normal, which is typical for Cristina Wade.  She is not on any medications to exacerbate this.  I encouraged her to stay well-hydrated.  As long as she does not have significant fluid retention, she can  even liberalize her sodium intake a little bit to help minimize risk for lightheadedness.  Palpitations: Frequency is similar to prior visits without associated symptoms.  Defer medication changes, particularly in the setting of intermittent lightheadedness and borderline low blood pressure.    Dispo: Return to clinic in 6 months.  Signed, Sammy Crisp, MD

## 2023-10-04 ENCOUNTER — Ambulatory Visit: Admitting: Family

## 2023-10-04 ENCOUNTER — Encounter: Payer: Self-pay | Admitting: Family

## 2023-10-04 VITALS — BP 110/70 | HR 66 | Temp 97.8°F | Ht 70.0 in | Wt 124.0 lb

## 2023-10-04 DIAGNOSIS — R0789 Other chest pain: Secondary | ICD-10-CM | POA: Diagnosis not present

## 2023-10-04 DIAGNOSIS — R300 Dysuria: Secondary | ICD-10-CM

## 2023-10-04 DIAGNOSIS — R399 Unspecified symptoms and signs involving the genitourinary system: Secondary | ICD-10-CM | POA: Diagnosis not present

## 2023-10-04 LAB — POCT URINALYSIS DIPSTICK
Bilirubin, UA: NEGATIVE
Glucose, UA: NEGATIVE
Ketones, UA: NEGATIVE
Nitrite, UA: POSITIVE
Protein, UA: NEGATIVE
Spec Grav, UA: 1.01 (ref 1.010–1.025)
Urobilinogen, UA: 0.2 U/dL
pH, UA: 6.5 (ref 5.0–8.0)

## 2023-10-04 LAB — URINALYSIS, ROUTINE W REFLEX MICROSCOPIC
Bilirubin Urine: NEGATIVE
Ketones, ur: NEGATIVE
Nitrite: POSITIVE — AB
Specific Gravity, Urine: <=1.005 — AB (ref 1.000–1.030)
Total Protein, Urine: NEGATIVE
Urine Glucose: NEGATIVE
Urobilinogen, UA: 0.2 (ref 0.0–1.0)
pH: 6.5 (ref 5.0–8.0)

## 2023-10-04 MED ORDER — CIPROFLOXACIN HCL 500 MG PO TABS: 500.0000 mg | ORAL_TABLET | Freq: Two times a day (BID) | ORAL | 0 refills | Status: AC

## 2023-10-04 NOTE — Assessment & Plan Note (Addendum)
 Afebrile.  Duration 1 week.  History of neurogenic bladder, self cath.  Urinalysis with trace leukocytes, RBCs; positive nitrites.She was treated 08/16/23 with cipro  500mg  BID x 7 days for enterococcus faecalis .  She states she usually takes 5 or 10-day course versus standard 3 days.  Macrobid  is not effective for her per patient.  Counseled on risk of ciprofloxacin  including black box warning of the reversible tendon rupture.  Counseled on risk of C. difficile.  Provided Cipro  500 twice daily x 10 days.  Advised probiotics .pending urine culture

## 2023-10-04 NOTE — Patient Instructions (Addendum)
 I would recommend stopping naprosyn  due to history GI bleed.   You may switch to tylenol  arthritis 650 mg tablet   I would recommend ROLL on biofreeze AND lidocaine  patch as well.   I was also recommend light duty at work.   Start ciprofloxacin  500 twice daily.   As discussed, ciprofloxacin  is associated with severe diarrheal infection such as C. difficile and irreversible tendon rupture.  If you experience diarrhea or calf pain, please stop medication immediately and let me know.  Please ensure you are on probiotics.

## 2023-10-04 NOTE — Progress Notes (Signed)
 Assessment & Plan:  UTI symptoms -     POCT urinalysis dipstick -     Urine Culture -     Urinalysis, Routine w reflex microscopic -     Ciprofloxacin  HCl; Take 1 tablet (500 mg total) by mouth 2 (two) times daily for 10 days.  Dispense: 20 tablet; Refill: 0  Chest wall pain Assessment & Plan: Reproducible chest wall pain.  Reviewed cardiology note.  Patient and I discussed history of GI bleed.  Considering changing from Naprosyn  to Tylenol .  She is compliant with Protonix  . I also advised trial of roll-on Biofreeze, lidocaine  patch, heat therapy.  Previously tried physical therapy without relief.  Discussed returning to work with light duty so that she is not lifting heavy trays of food.    Dysuria Assessment & Plan: Afebrile.  Duration 1 week.  History of neurogenic bladder, self cath.  Urinalysis with trace leukocytes, RBCs; positive nitrites.She was treated 08/16/23 with cipro  500mg  BID x 7 days for enterococcus faecalis .  She states she usually takes 5 or 10-day course versus standard 3 days.  Macrobid  is not effective for her per patient.  Counseled on risk of ciprofloxacin  including black box warning of the reversible tendon rupture.  Counseled on risk of C. difficile.  Provided Cipro  500 twice daily x 10 days.  Advised probiotics .pending urine culture      Return precautions given.   Risks, benefits, and alternatives of the medications and treatment plan prescribed today were discussed, and patient expressed understanding.   Education regarding symptom management and diagnosis given to patient on AVS either electronically or printed.  No follow-ups on file.  Bascom Bossier, FNP  Subjective:    Patient ID: Cristina Wade, female    DOB: August 14, 1970, 53 y.o.   MRN: [id number]  CC: Cristina Wade is a 53 y.o. female who presents today for an acute visit.    HPI: Accompanied by sister  Complains of urinary frequency x 1 week  Endorses chills and tactile fever   Complains of  episodic left side chest wall pain with palpation, 2-3 years ago, improved since 'work has gotten out'. Some soreness over right side of chest, but less.  CP is less often and area is not as sore when presses on the area since stopped working for summer.  Denies sob,  cough, neck pain, shoulder pain.  She has just started naprosyn  yesterday, pain is unchanged from yesterday.  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       Macrobid  ineffective for UTI per patient  Mammogram is scheduled.   H/o GERD, GIB, IBS  Compliant with protonix  40mg  every day  Follows in urology for neurogenic bladder ( self cath 3 times daily) , nephrolithiasis recurrent UTIs Last seen by urology 08/30/2023 Treated with cipro  500mg  BID x 7 days, enterococcus faecalis    Aug 16, 2023, Enterococcus faecalis - July 10, 2023-enterococcal faecalis - June 21, 2023, Streptococcus mitis/oralis group  - December 01, 2022, E. Coli - November 26, 2022 - E. coli - December 01, 2022 - E.coli - October 11, 2022 - Citrobacter koseri     Obtained in urology via catheter Urinalysis 08/30/2023 trace RBCs, negative leukocyte No growth urine culture  Follow-up yesterday Dr. Nolan Battle for chest wall pain.  Started on naproxen  250 mg twice daily as needed reassuring  CTA 2021.  Pending echocardiogram ( scheduled 10/17/23)  CTA 08/31/22 c  Normal aorta and  branch vessels. 2. Single prominent nonspecific subcarinal lymph node measuring 1.4 x 3.1 cm. No other adenopathy is seen. 3. Emphysema. 4. 4.7 cm mass of either nonaerated stool or a true mucosal mass in the cecum. Further evaluation is recommended. 5. Nonobstructive bilateral micronephrolithiasis. 6. Constipation and diverticulosis.  PET scan 09/25/22  No findings suspicious for malignancy.   Specifically, the prior subcarinal node has resolved.   Additionally, there is no residual soft tissue lesion along the cecum. Allergies: Milk (cow),  Milk-related compounds, Omeprazole , and Penicillins Current Outpatient Medications on File Prior to Visit  Medication Sig Dispense Refill   Calcium  Carbonate-Vit D-Min (CALCIUM  1200 PO) Take 2-3 tablets by mouth daily. Reported on 05/26/2015     cholecalciferol  (VITAMIN D3) 10 MCG (400 UNIT) TABS tablet Take 400 Units by mouth daily.     Multiple Vitamin (MULTIVITAMIN ADULT PO) Take 1 tablet by mouth daily.     naproxen  (NAPROSYN ) 250 MG tablet Take 1 tablet (250 mg total) by mouth 2 (two) times daily as needed (chest wall pain). 30 tablet 1   pantoprazole  (PROTONIX ) 40 MG tablet Take 1 tablet (40 mg total) by mouth 2 (two) times daily before a meal. 60 tablet 1   polyethylene glycol (MIRALAX  / GLYCOLAX ) 17 g packet Take 17 g by mouth daily.     Probiotic Product (PROBIOTIC DAILY PO) Take 1 tablet by mouth daily.     psyllium (REGULOID) 0.52 g capsule Take 0.52 g by mouth daily.     ZINC  OXIDE PO Take 1 tablet by mouth daily.     LORazepam (ATIVAN) 0.5 MG tablet Take by mouth. (Patient not taking: Reported on 10/04/2023)     prochlorperazine  (COMPAZINE ) 10 MG tablet Take 1 tablet (10 mg total) by mouth every 6 (six) hours as needed for nausea (headache). (Patient not taking: Reported on 10/04/2023) 20 tablet 0   No current facility-administered medications on file prior to visit.    Review of Systems  Constitutional:  Negative for chills and fever.  Respiratory:  Negative for cough and shortness of breath.   Cardiovascular:  Positive for chest pain. Negative for palpitations.  Gastrointestinal:  Negative for nausea and vomiting.  Genitourinary:  Positive for dysuria. Negative for urgency.      Objective:    BP 110/70   Pulse 66   Temp 97.8 F (36.6 C) (Oral)   Ht 5' 10 (1.778 m)   Wt 124 lb (56.2 kg)   LMP 11/03/2021 (Exact Date)   SpO2 98%   BMI 17.79 kg/m   BP Readings from Last 3 Encounters:  10/04/23 110/70  10/03/23 (!) 106/58  08/30/23 100/63   Wt Readings from Last 3  Encounters:  10/04/23 124 lb (56.2 kg)  10/03/23 122 lb 6.4 oz (55.5 kg)  08/30/23 128 lb (58.1 kg)    Physical Exam Vitals reviewed.  Constitutional:      Appearance: She is well-developed.   Eyes:     Conjunctiva/sclera: Conjunctivae normal.    Cardiovascular:     Rate and Rhythm: Normal rate and regular rhythm.     Pulses: Normal pulses.     Heart sounds: Normal heart sounds.  Pulmonary:     Effort: Pulmonary effort is normal.     Breath sounds: Normal breath sounds. No wheezing, rhonchi or rales.  Chest:      Comments: Reproducible chest wall tenderness.  No pain with deep inspiration.  No bony deformities, bony step-off, rash  Skin:    General: Skin is  warm and dry.   Neurological:     Mental Status: She is alert.   Psychiatric:        Speech: Speech normal.        Behavior: Behavior normal.        Thought Content: Thought content normal.

## 2023-10-04 NOTE — Assessment & Plan Note (Signed)
 Reproducible chest wall pain.  Reviewed cardiology note.  Patient and I discussed history of GI bleed.  Considering changing from Naprosyn  to Tylenol .  She is compliant with Protonix  . I also advised trial of roll-on Biofreeze, lidocaine  patch, heat therapy.  Previously tried physical therapy without relief.  Discussed returning to work with light duty so that she is not lifting heavy trays of food.

## 2023-10-05 LAB — URINE CULTURE
MICRO NUMBER:: 16601369
SPECIMEN QUALITY:: ADEQUATE

## 2023-10-08 ENCOUNTER — Ambulatory Visit: Payer: Self-pay | Admitting: Family

## 2023-10-08 NOTE — Telephone Encounter (Signed)
 Noted

## 2023-10-10 ENCOUNTER — Telehealth: Payer: Self-pay | Admitting: Podiatry

## 2023-10-10 NOTE — Telephone Encounter (Signed)
 Received surgical consent forms. Called pt to get scheduled and she is scheduled for 11/16/23 she is on a cruise the week before. She asked about getting a knee scooter. Can I order that for her?  She also asked about showering after surgery with a cast on.

## 2023-10-11 ENCOUNTER — Encounter: Payer: Self-pay | Admitting: Family

## 2023-10-11 NOTE — Telephone Encounter (Signed)
 Spoke to pt. Pt stated that her symptoms started about a week ago but have gotten worse. Pt states that her light headedness, dizziness and sob has increased since last appt and occurs mostly when she stands up. Pt stated that when she takes a deep breath it hurts starting from her back and traveling to left side of shoulder. Pt complaints of left sided tenderness starting from back all the way to front of her body. Informed pt that she needs to be in the emergency room for further work up. Informed pt that this shouldn't wait til be addressed on Monday. Pt  in agreement with going to hospital but stated it would be after 4pm. Advise pt that she should call 911 if her sob and dizziness, is happening more frequently or if her chest pain gets worse

## 2023-10-12 ENCOUNTER — Emergency Department
Admission: EM | Admit: 2023-10-12 | Discharge: 2023-10-12 | Disposition: A | Discharge status: Home / Self Care | Attending: Emergency Medicine | Admitting: Emergency Medicine

## 2023-10-12 ENCOUNTER — Emergency Department

## 2023-10-12 ENCOUNTER — Other Ambulatory Visit: Payer: Self-pay

## 2023-10-12 ENCOUNTER — Encounter

## 2023-10-12 DIAGNOSIS — R079 Chest pain, unspecified: Secondary | ICD-10-CM

## 2023-10-12 DIAGNOSIS — R0602 Shortness of breath: Secondary | ICD-10-CM | POA: Insufficient documentation

## 2023-10-12 DIAGNOSIS — R519 Headache, unspecified: Secondary | ICD-10-CM | POA: Insufficient documentation

## 2023-10-12 DIAGNOSIS — R42 Dizziness and giddiness: Secondary | ICD-10-CM | POA: Insufficient documentation

## 2023-10-12 DIAGNOSIS — R0789 Other chest pain: Secondary | ICD-10-CM | POA: Insufficient documentation

## 2023-10-12 LAB — CBC
HCT: 41 % (ref 36.0–46.0)
Hemoglobin: 13.5 g/dL (ref 12.0–15.0)
MCH: 30.7 pg (ref 26.0–34.0)
MCHC: 32.9 g/dL (ref 30.0–36.0)
MCV: 93.2 fL (ref 80.0–100.0)
Platelets: 192 10*3/uL (ref 150–400)
RBC: 4.4 MIL/uL (ref 3.87–5.11)
RDW: 12.7 % (ref 11.5–15.5)
WBC: 6.1 10*3/uL (ref 4.0–10.5)
nRBC: 0 % (ref 0.0–0.2)

## 2023-10-12 LAB — TROPONIN I (HIGH SENSITIVITY)
Troponin I (High Sensitivity): 3 ng/L (ref <18)
Troponin I (High Sensitivity): 3 ng/L (ref <18)

## 2023-10-12 LAB — URINALYSIS, ROUTINE W REFLEX MICROSCOPIC
Bacteria, UA: NONE SEEN
Bilirubin Urine: NEGATIVE
Glucose, UA: NEGATIVE mg/dL
Hgb urine dipstick: NEGATIVE
Ketones, ur: NEGATIVE mg/dL
Leukocytes,Ua: NEGATIVE
Nitrite: NEGATIVE
Protein, ur: NEGATIVE mg/dL
Specific Gravity, Urine: 1.005 (ref 1.005–1.030)
pH: 8 (ref 5.0–8.0)

## 2023-10-12 LAB — COMPREHENSIVE METABOLIC PANEL WITH GFR
ALT: 20 U/L (ref 0–44)
AST: 17 U/L (ref 15–41)
Albumin: 4 g/dL (ref 3.5–5.0)
Alkaline Phosphatase: 50 U/L (ref 38–126)
Anion gap: 6 (ref 5–15)
BUN: 23 mg/dL — ABNORMAL HIGH (ref 6–20)
CO2: 24 mmol/L (ref 22–32)
Calcium: 8.9 mg/dL (ref 8.9–10.3)
Chloride: 107 mmol/L (ref 98–111)
Creatinine, Ser: 0.64 mg/dL (ref 0.44–1.00)
GFR, Estimated: >60 mL/min (ref >60)
Glucose, Bld: 83 mg/dL (ref 70–99)
Potassium: 3.6 mmol/L (ref 3.5–5.1)
Sodium: 137 mmol/L (ref 135–145)
Total Bilirubin: 0.8 mg/dL (ref 0.0–1.2)
Total Protein: 6.7 g/dL (ref 6.5–8.1)

## 2023-10-12 LAB — LIPASE, BLOOD: Lipase: 91 U/L — ABNORMAL HIGH (ref 11–51)

## 2023-10-12 LAB — D-DIMER, QUANTITATIVE: D-Dimer, Quant: <0.27 ug{FEU}/mL (ref 0.00–0.50)

## 2023-10-12 MED ORDER — NAPROXEN 500 MG PO TABS
500.0000 mg | ORAL_TABLET | Freq: Two times a day (BID) | ORAL | 0 refills | Status: AC
Start: 2023-10-12 — End: 2023-10-17

## 2023-10-12 NOTE — ED Notes (Signed)
 This RN reviewed paperwork with pt. No further complaints or questions. Pt ambulated to lobby.

## 2023-10-12 NOTE — Telephone Encounter (Signed)
 Update: Pt at ED currently

## 2023-10-12 NOTE — Telephone Encounter (Signed)
 Called pt and notified that I had ordered the knee scooter and [employer] should be calling to set it up.  As far as taking a shower she could go to a medical supply store and get a shower shield and that is the only was she should take a shower.  She asked about needing a shower chair and I told her generally we don't order them for the surgeries we do. She asked if it would be ok to stand on foot to take a shower?

## 2023-10-12 NOTE — Telephone Encounter (Signed)
 Per review of chart, pt did not go to ED as advised but she is scheduled to see Dr Abbey on 10/22/23 for these sx's.  Please follow-up on this with your advice.

## 2023-10-12 NOTE — ED Triage Notes (Signed)
 Pt to ED via POV from home. Pt reports has been having dizziness, HA, left sided chest pain and generalized abd pain x2-3wks. Pt denies N/V/D.

## 2023-10-12 NOTE — Discharge Instructions (Addendum)
 You were seen in the emergency department today for evaluation for your chest pain and lightheadedness.  Your testing fortunately did not show an emergency cause for this.  Continue to follow-up with your primary care doctor and cardiologist for further evaluation.  I sent a course of a anti-inflammatory for you to take regularly over the next few days.  Do not take this with other NSAIDs.  Return to the ER for new or worsening symptoms.

## 2023-10-12 NOTE — ED Provider Notes (Signed)
 [facility] Provider Note    Event Date/Time   First MD Initiated Contact with Patient 10/12/23 1101     (approximate)   History   Dizziness, Abdominal Pain, and Chest Pain   HPI  Cristina Wade is a 53 year old female presenting to the emergency department for evaluation of chest pain.  Patient reports that over the past several weeks she has had intermittent episodes of left-sided chest wall pain.  Saw her cardiologist who thought it may be related to costochondritis.  She additionally reports occasional episodes of shortness of breath worse with exertion.  No fevers or chills.  No trauma.  The pain is sometimes over her left anterior chest wall and sometimes more along her lower anterolateral chest.  In addition, she has had episodes of lightheadedness that she has been told may be related to low blood pressure.  Denies current lightheadedness.  Has had intermittent headaches, no associated numbness, tingling, weakness, vision changes.  Reports minimal headache currently.  Called her eye care office today who recommended ER presentation in the setting of her chest pain.  Reviewed cardiology visit with Dr. Mady from 10/03/2023.  At that time, she was evaluated for chest wall pain thought to be musculoskeletal, dyspnea on exertion that was thought to be chronic possibly related to deconditioning, awaiting outpatient echo.  Lightheadedness stopped possibly be related to dehydration with plans to liberalize her sodium intake.     Physical Exam   Triage Vital Signs: ED Triage Vitals [10/12/23 0933]  Encounter Vitals Group     BP 120/87     Girls Systolic BP Percentile      Girls Diastolic BP Percentile      Boys Systolic BP Percentile      Boys Diastolic BP Percentile      Pulse Rate 68     Resp 20     Temp 98 F (36.7 C)     Temp Source Oral     SpO2 100 %     Weight      Height      Head Circumference      Peak Flow      Pain Score 3     Pain Loc       Pain Education      Exclude from Growth Chart     Most recent vital signs: Vitals:   10/12/23 0933  BP: 120/87  Pulse: 68  Resp: 20  Temp: 98 F (36.7 C)  SpO2: 100%     General: Awake, interactive  CV:  Regular rate, good peripheral perfusion.  Chest wall: Reproducible tenderness over the left anterior chest wall Resp:  Unlabored respirations, lungs clear to auscultation Abd:  Nondistended, soft, nontender Neuro:  Symmetric facial movement, fluid speech   ED Results / Procedures / Treatments   Labs (all labs ordered are listed, but only abnormal results are displayed) Labs Reviewed  COMPREHENSIVE METABOLIC PANEL WITH GFR - Abnormal; Notable for the following components:      Result Value   BUN 23 (*)    All other components within normal limits  LIPASE, BLOOD - Abnormal; Notable for the following components:   Lipase 91 (*)    All other components within normal limits  URINALYSIS, ROUTINE W REFLEX MICROSCOPIC - Abnormal; Notable for the following components:   Color, Urine STRAW (*)    APPearance CLEAR (*)    All other components within normal limits  CBC  D-DIMER, QUANTITATIVE (NOT AT [facility])  TROPONIN I (HIGH SENSITIVITY)  TROPONIN I (HIGH SENSITIVITY)     EKG EKG independently reviewed and interpreted by myself demonstrates:  EKG with normal sinus rhythm rate of 66, PR 140, QRS 86 weeks QTC 419, no acute ST changes  RADIOLOGY Imaging independently reviewed and interpreted by myself demonstrates:  CXR without focal consolidation  Formal Radiology Read:  DG Chest 2 View Result Date: 10/12/2023 CLINICAL DATA:  Chest pain. EXAM: CHEST - 2 VIEW COMPARISON:  December 06, 2022. FINDINGS: The heart size and mediastinal contours are within normal limits. Both lungs are clear. The visualized skeletal structures are unremarkable. IMPRESSION: No active cardiopulmonary disease. Electronically Signed   By: Lynwood Landy Raddle M.D.   On: 10/12/2023 10:15     PROCEDURES:  Critical Care performed: No  Procedures   MEDICATIONS ORDERED IN ED: Medications - No data to display   IMPRESSION / MDM / ASSESSMENT AND PLAN / ED COURSE  I reviewed the triage vital signs and the nursing notes.  Differential diagnosis includes, but is not limited to, ACS, pneumonia, pneumothorax, PE, musculoskeletal pain, dehydration  Patient's presentation is most consistent with acute presentation with potential threat to life or bodily function.  53 year old female presenting with multiple subacute complaints including chest pain, lightheadedness.  Has been seen as an outpatient for the same, denies acute change in these.  Stable vitals on presentation.  Labs with reassuring CBC, CMP.  Lipase slightly elevated, but no significant epigastric pain on exam, no nausea or vomiting, not consistent with pancreatitis.  Urine without evidence of infection.  Negative troponin x 2.  Negative D-dimer.  Overall low suspicion emergent process.  Patient is already established with primary care and cardiology.  She is awaiting outpatient echo.  Do think she is stable to continue her outpatient evaluation.  Discussed with patient who is comfortable this plan.  Strict return precautions provided.  Patient discharged stable condition.      FINAL CLINICAL IMPRESSION(S) / ED DIAGNOSES   Final diagnoses:  Nonspecific chest pain  Lightheadedness  Shortness of breath     Rx / DC Orders   ED Discharge Orders          Ordered    naproxen  (NAPROSYN ) 500 MG tablet  2 times daily with meals        10/12/23 1238             Note:  This document was prepared using Dragon voice recognition software and may include unintentional dictation errors.   Levander Slate, MD 10/12/23 1239

## 2023-10-16 ENCOUNTER — Other Ambulatory Visit

## 2023-10-16 ENCOUNTER — Ambulatory Visit: Attending: Internal Medicine

## 2023-10-16 DIAGNOSIS — R0609 Other forms of dyspnea: Secondary | ICD-10-CM | POA: Diagnosis not present

## 2023-10-16 DIAGNOSIS — R0602 Shortness of breath: Secondary | ICD-10-CM

## 2023-10-16 LAB — ECHOCARDIOGRAM COMPLETE
AR max vel: 2.88 cm2
AV Area VTI: 2.94 cm2
AV Area mean vel: 2.79 cm2
AV Mean grad: 2 mmHg
AV Peak grad: 4.5 mmHg
Ao pk vel: 1.06 m/s
Area-P 1/2: 3.37 cm2
S' Lateral: 3.33 cm

## 2023-10-16 NOTE — Telephone Encounter (Signed)
 Notified pt that she should wear the boot in the shower with the shower shield on. She should not stand at anytime without the boot.  She said she thought she was going to have a cast and I told her the same would apply.  She asked if I was going to send her a list where she could get the shower shield and I told her we don't really have a list but any medical supply store or even [facility] should have them.She seemed to understand.

## 2023-10-17 ENCOUNTER — Encounter

## 2023-10-17 ENCOUNTER — Ambulatory Visit: Payer: Self-pay | Admitting: Internal Medicine

## 2023-10-17 ENCOUNTER — Other Ambulatory Visit

## 2023-10-22 ENCOUNTER — Ambulatory Visit

## 2023-10-22 ENCOUNTER — Encounter: Payer: Self-pay | Admitting: Family

## 2023-10-22 ENCOUNTER — Telehealth: Payer: Self-pay | Admitting: Podiatry

## 2023-10-22 NOTE — Telephone Encounter (Signed)
 Pt left voicemail that she checked with insurance and they would cover the knee scooter at 100%. She was not sure who or where order needed to be sent.  I had originally sent the order to [employer] on 6/26 and they sent back 7/7 the message stating to resubmit once pt has made her decision .  I have resubmitted the order for her to get a knee scooter.   I left message for pt with the number to call [employer] to check on status as I have resubmitted it.

## 2023-10-22 NOTE — Telephone Encounter (Signed)
 Patient requests that provider get in touch with insurance company to find out if they will cover the knee scooter.

## 2023-10-22 NOTE — Telephone Encounter (Signed)
 Would you like to see pts first?

## 2023-10-22 NOTE — Telephone Encounter (Signed)
 Patient is calling stating she is havingsurgery she would like a RX faxed to DME company for a knee scooter-[phone]-fax number for DME

## 2023-10-23 ENCOUNTER — Other Ambulatory Visit: Payer: Self-pay | Admitting: Family

## 2023-10-23 MED ORDER — SCOPOLAMINE 1 MG/3DAYS TD PT72: 1.0000 | MEDICATED_PATCH | TRANSDERMAL | 0 refills | Status: DC

## 2023-10-24 ENCOUNTER — Telehealth: Payer: Self-pay | Admitting: Podiatry

## 2023-10-24 NOTE — Telephone Encounter (Signed)
 Pt had called and left message on my phone with just her name and number.   I returned call and explained that I had already sent the order/prescription to [employer] again and they should get the authorization if needed. I also gave pt the number to contact them directly.

## 2023-10-24 NOTE — Telephone Encounter (Signed)
 Patient calling to follow up about knee scooter. She is going on a cruise and wants the RX sent before she goes.

## 2023-10-24 NOTE — Telephone Encounter (Signed)
 [employer] needs a PA for scooter order.  Please faxed to:[phone]

## 2023-10-26 DIAGNOSIS — Z0271 Encounter for disability determination: Secondary | ICD-10-CM

## 2023-10-26 NOTE — Telephone Encounter (Signed)
 S/w pt verf email to send copy of forms that are faxed to [facility] [phone]. OOW 11/16/23-01/07/24 DOS 11/16/23

## 2023-10-26 NOTE — Telephone Encounter (Signed)
 Pt left message today with name and number.  Returned call and she says [employer] has gotten the approval for her knee scooter and it is being delivered to her on 7/31.

## 2023-10-30 ENCOUNTER — Encounter: Payer: Self-pay | Admitting: Family

## 2023-10-30 NOTE — Telephone Encounter (Signed)
 Noted

## 2023-11-07 ENCOUNTER — Telehealth: Payer: Self-pay | Admitting: Podiatry

## 2023-11-07 NOTE — Telephone Encounter (Signed)
 DOS- 11/16/2023  ENDOSCOPIC PLANTAR FASCIOTOMY BLOCK LT- 70106 TENDON REPAIR LT- 71913  [employer] EFFECTIVE DATE- 04/18/2023  DEDUCTIBLE- $1500 REMAINING- $0 OOP- $5900 REMAINING- $0 COINSURANCE- 30% WITH 0$ COPAY  PER ROSALYN A. WITH [employer], NO PRIOR AUTHS ARE REQUIRED FOR CPT CODES 70106 AND [phone]. REF# [id number] 2:59 PM EST

## 2023-11-12 ENCOUNTER — Ambulatory Visit
Admission: RE | Admit: 2023-11-12 | Discharge: 2023-11-12 | Disposition: A | Discharge status: Home / Self Care | Source: Ambulatory Visit | Attending: Obstetrics and Gynecology | Admitting: Obstetrics and Gynecology

## 2023-11-12 DIAGNOSIS — Z1231 Encounter for screening mammogram for malignant neoplasm of breast: Secondary | ICD-10-CM | POA: Insufficient documentation

## 2023-11-14 ENCOUNTER — Other Ambulatory Visit: Payer: Self-pay | Admitting: Podiatry

## 2023-11-14 MED ORDER — CLINDAMYCIN HCL 150 MG PO CAPS: 150.0000 mg | ORAL_CAPSULE | Freq: Three times a day (TID) | ORAL | 0 refills | Status: DC

## 2023-11-14 MED ORDER — ONDANSETRON HCL 4 MG PO TABS: 4.0000 mg | ORAL_TABLET | Freq: Three times a day (TID) | ORAL | 0 refills | Status: DC | PRN

## 2023-11-14 MED ORDER — HYDROCODONE-ACETAMINOPHEN 5-325 MG PO TABS: 2.0000 | ORAL_TABLET | Freq: Four times a day (QID) | ORAL | 0 refills | Status: AC | PRN

## 2023-11-16 DIAGNOSIS — M722 Plantar fascial fibromatosis: Secondary | ICD-10-CM | POA: Diagnosis not present

## 2023-11-16 DIAGNOSIS — S86312D Strain of muscle(s) and tendon(s) of peroneal muscle group at lower leg level, left leg, subsequent encounter: Secondary | ICD-10-CM | POA: Diagnosis not present

## 2023-11-18 ENCOUNTER — Emergency Department
Admission: EM | Admit: 2023-11-18 | Discharge: 2023-11-19 | Disposition: A | Discharge status: Home / Self Care | Attending: Emergency Medicine | Admitting: Emergency Medicine

## 2023-11-18 ENCOUNTER — Other Ambulatory Visit: Payer: Self-pay

## 2023-11-18 DIAGNOSIS — R1084 Generalized abdominal pain: Secondary | ICD-10-CM | POA: Diagnosis present

## 2023-11-18 DIAGNOSIS — K5903 Drug induced constipation: Secondary | ICD-10-CM | POA: Insufficient documentation

## 2023-11-18 LAB — COMPREHENSIVE METABOLIC PANEL WITH GFR
ALT: 23 U/L (ref 0–44)
AST: 21 U/L (ref 15–41)
Albumin: 4.4 g/dL (ref 3.5–5.0)
Alkaline Phosphatase: 67 U/L (ref 38–126)
Anion gap: 8 (ref 5–15)
BUN: 12 mg/dL (ref 6–20)
CO2: 28 mmol/L (ref 22–32)
Calcium: 9.5 mg/dL (ref 8.9–10.3)
Chloride: 106 mmol/L (ref 98–111)
Creatinine, Ser: 0.72 mg/dL (ref 0.44–1.00)
GFR, Estimated: >60 mL/min (ref >60)
Glucose, Bld: 108 mg/dL — ABNORMAL HIGH (ref 70–99)
Potassium: 4.1 mmol/L (ref 3.5–5.1)
Sodium: 142 mmol/L (ref 135–145)
Total Bilirubin: 0.7 mg/dL (ref 0.0–1.2)
Total Protein: 7.7 g/dL (ref 6.5–8.1)

## 2023-11-18 LAB — CBC
HCT: 43.1 % (ref 36.0–46.0)
Hemoglobin: 14.1 g/dL (ref 12.0–15.0)
MCH: 29.9 pg (ref 26.0–34.0)
MCHC: 32.7 g/dL (ref 30.0–36.0)
MCV: 91.5 fL (ref 80.0–100.0)
Platelets: 249 K/uL (ref 150–400)
RBC: 4.71 MIL/uL (ref 3.87–5.11)
RDW: 12.8 % (ref 11.5–15.5)
WBC: 6.4 K/uL (ref 4.0–10.5)
nRBC: 0 % (ref 0.0–0.2)

## 2023-11-18 LAB — LIPASE, BLOOD: Lipase: 40 U/L (ref 11–51)

## 2023-11-18 MED ORDER — KETOROLAC TROMETHAMINE 30 MG/ML IJ SOLN
30.0000 mg | Freq: Once | INTRAMUSCULAR | Status: AC
  Administered 2023-11-19: 30 mg via INTRAVENOUS
  Filled 2023-11-18: qty 1

## 2023-11-18 MED ORDER — SODIUM CHLORIDE 0.9 % IV BOLUS (SEPSIS)
1000.0000 mL | Freq: Once | INTRAVENOUS | Status: AC
  Administered 2023-11-19: 1000 mL via INTRAVENOUS

## 2023-11-18 MED ORDER — MORPHINE SULFATE (PF) 4 MG/ML IV SOLN
4.0000 mg | Freq: Once | INTRAVENOUS | Status: DC
  Filled 2023-11-18: qty 1

## 2023-11-18 MED ORDER — ONDANSETRON HCL 4 MG/2ML IJ SOLN
4.0000 mg | Freq: Once | INTRAMUSCULAR | Status: AC
  Administered 2023-11-19: 4 mg via INTRAVENOUS
  Filled 2023-11-18: qty 2

## 2023-11-18 NOTE — ED Provider Notes (Signed)
 [facility] Provider Note    Event Date/Time   First MD Initiated Contact with Patient 11/18/23 2301     (approximate)   History   Abdominal Pain   HPI  Cristina Wade is a 53 y.o. female with history of kidney stones, chronic pelvic pain, neurogenic bladder, recurrent UTIs who presents to the emergency department complaints of generalized periumbilical abdominal pain ongoing for the past several days.  Patient underwent left plantar fasciotomy with Dr. Verta on August 1.  States she was having this abdominal pain prior to surgery.  She was placed on clindamycin  she states prophylactically.  She has been taking hydrocodone  at home.  Her last bowel movement was yesterday.  She is passing gas.  She does report having abnormal vaginal discharge..  She saw OB/GYN on 10/30/2023 and was diagnosed with UTI and BV.  She was started on triamcinolone  ointment, estradiol vaginal cream, Macrobid  for 5 days.  She states that her pain started before her foot surgery.  She also reports having left-sided chest pain that she reports has been ongoing for weeks.  Was seen by cardiology and diagnosed with costochondritis.   History provided by patient, sister.    Past Medical History:  Diagnosis Date   Anxiety    Asthma    Chronic pelvic pain in female 2018   Cystitis    Cystocele    Endometrial polyp    Endometriosis    per pt report but never seen during surg   Frequent headaches    Gastritis    GERD (gastroesophageal reflux disease)    rare   Gross hematuria    History of kidney stones    Microscopic hematuria    Migraine    Migraines    Neuropathy    Osteopenia    Urinary disorder    UTI (lower urinary tract infection)     Past Surgical History:  Procedure Laterality Date   COLONOSCOPY N/A 11/05/2014   Procedure: COLONOSCOPY;  Surgeon: Lamar ONEIDA Holmes, MD;  Location: [facility] ENDOSCOPY;  Service: Endoscopy;  Laterality: N/A;   COLONOSCOPY  11/2016   Dr.  Holmes   CYSTOSCOPY  2007   with biopsy    CYSTOSCOPY N/A 06/01/2016   Procedure: CYSTOSCOPY;  Surgeon: Glory High, MD;  Location: [facility] ORS;  Service: Gynecology;  Laterality: N/A;   DIAGNOSTIC LAPAROSCOPY     DILATATION & CURETTAGE/HYSTEROSCOPY WITH MYOSURE N/A 04/13/2015   Procedure: DILATATION & CURETTAGE/HYSTEROSCOPY WITH MYOSURE/POLYPECTOMY;  Surgeon: Mitzie BROCKS Yelitza Reach, MD;  Location: [facility] ORS;  Service: Gynecology;  Laterality: N/A;   DILATATION & CURETTAGE/HYSTEROSCOPY WITH MYOSURE N/A 06/01/2016   Procedure: DILATATION & CURETTAGE/HYSTEROSCOPY WITH MYOSURE;  Surgeon: Glory High, MD;  Location: [facility] ORS;  Service: Gynecology;  Laterality: N/A;   DILATION AND CURETTAGE OF UTERUS     ESOPHAGOGASTRODUODENOSCOPY (EGD) WITH PROPOFOL  N/A 08/31/2022   Procedure: ESOPHAGOGASTRODUODENOSCOPY (EGD) WITH PROPOFOL ;  Surgeon: Onita Elspeth Sharper, DO;  Location: [facility] ENDOSCOPY;  Service: Gastroenterology;  Laterality: N/A;   GUM SURGERY     laproscopy  2007   POLYPECTOMY     endometrial    MEDICATIONS:  Prior to Admission medications   Medication Sig Start Date End Date Taking? Authorizing Provider  clindamycin  (CLEOCIN ) 150 MG capsule Take 1 capsule (150 mg total) by mouth 3 (three) times daily. 11/14/23   Hyatt, Max T, DPM  HYDROcodone -acetaminophen  (NORCO/VICODIN) 5-325 MG tablet Take 2 tablets by mouth every 6 (six) hours as needed for up to 7 days for moderate  pain (pain score 4-6). Take one to 2 tablets by mouth every 6-8 hours as needed for pain 11/14/23 11/21/23  Hyatt, Max T, DPM  ondansetron  (ZOFRAN ) 4 MG tablet Take 1 tablet (4 mg total) by mouth every 8 (eight) hours as needed. 11/14/23   Hyatt, Max T, DPM  Calcium  Carbonate-Vit D-Min (CALCIUM  1200 PO) Take 2-3 tablets by mouth daily. Reported on 05/26/2015    [provider]  cholecalciferol  (VITAMIN D3) 10 MCG (400 UNIT) TABS tablet Take 400 Units by mouth daily.    [provider]  LORazepam (ATIVAN) 0.5 MG  tablet Take by mouth. Patient not taking: Reported on 10/04/2023 06/20/23   [provider]  Multiple Vitamin (MULTIVITAMIN ADULT PO) Take 1 tablet by mouth daily.    [provider]  naproxen  (NAPROSYN ) 250 MG tablet Take 1 tablet (250 mg total) by mouth 2 (two) times daily as needed (chest wall pain). 10/03/23   End, Lonni, MD  pantoprazole  (PROTONIX ) 40 MG tablet Take 1 tablet (40 mg total) by mouth 2 (two) times daily before a meal. 08/31/22   Danford, Lonni SQUIBB, MD  polyethylene glycol (MIRALAX  / GLYCOLAX ) 17 g packet Take 17 g by mouth daily.    [provider]  Probiotic Product (PROBIOTIC DAILY PO) Take 1 tablet by mouth daily.    [provider]  prochlorperazine  (COMPAZINE ) 10 MG tablet Take 1 tablet (10 mg total) by mouth every 6 (six) hours as needed for nausea (headache). Patient not taking: Reported on 10/04/2023 07/17/23   Sung, Jade J, MD  psyllium (REGULOID) 0.52 g capsule Take 0.52 g by mouth daily.    [provider]  scopolamine  (TRANSDERM-SCOP) 1 MG/3DAYS Place 1 patch (1.5 mg total) onto the skin every 3 (three) days. 10/23/23   Dineen Rollene MATSU, FNP  ZINC  OXIDE PO Take 1 tablet by mouth daily.    [provider]    Physical Exam   Triage Vital Signs: ED Triage Vitals  Encounter Vitals Group     BP 11/18/23 2036 (!) 133/95     Girls Systolic BP Percentile --      Girls Diastolic BP Percentile --      Boys Systolic BP Percentile --      Boys Diastolic BP Percentile --      Pulse Rate 11/18/23 2036 70     Resp 11/18/23 2036 18     Temp 11/18/23 2036 98 F (36.7 C)     Temp src --      SpO2 11/18/23 2036 100 %     Weight --      Height --      Head Circumference --      Peak Flow --      Pain Score 11/18/23 2034 10     Pain Loc --      Pain Education --      Exclude from Growth Chart --     Most recent vital signs: Vitals:   11/18/23 2036 11/19/23 0000  BP: (!) 133/95 (!) 147/67  Pulse: 70   Resp:  18 13  Temp: 98 F (36.7 C)   SpO2: 100%     CONSTITUTIONAL: Alert, responds appropriately to questions. Well-appearing; well-nourished HEAD: Normocephalic, atraumatic EYES: Conjunctivae clear, pupils appear equal, sclera nonicteric ENT: normal nose; moist mucous membranes NECK: Supple, normal ROM CARD: RRR; S1 and S2 appreciated, mild left chest wall tender to palpation without crepitus or deformity RESP: Normal chest excursion without splinting or tachypnea;  breath sounds clear and equal bilaterally; no wheezes, no rhonchi, no rales, no hypoxia or respiratory distress, speaking full sentences ABD/GI: Non-distended; soft, mild generalized tenderness without guarding or rebound BACK: The back appears normal EXT: Normal ROM in all joints; no deformity noted, no edema, left foot in splint, normal capillary refill in the left toes SKIN: Normal color for age and race; warm; no rash on exposed skin NEURO: Moves all extremities equally, normal speech PSYCH: The patient's mood and manner are appropriate.   ED Results / Procedures / Treatments   LABS: (all labs ordered are listed, but only abnormal results are displayed) Labs Reviewed  COMPREHENSIVE METABOLIC PANEL WITH GFR - Abnormal; Notable for the following components:      Result Value   Glucose, Bld 108 (*)    All other components within normal limits  URINALYSIS, ROUTINE W REFLEX MICROSCOPIC - Abnormal; Notable for the following components:   Color, Urine YELLOW (*)    APPearance CLEAR (*)    All other components within normal limits  WET PREP, GENITAL  CHLAMYDIA/NGC RT PCR ([facility] ONLY)            URINE CULTURE  LIPASE, BLOOD  CBC  TROPONIN I (HIGH SENSITIVITY)     EKG:  EKG Interpretation Date/Time:  Monday November 19 2023 00:00:41 EDT Ventricular Rate:  64 PR Interval:  152 QRS Duration:  108 QT Interval:  448 QTC Calculation: 463 R Axis:   99  Text Interpretation: Sinus arrhythmia Abnormal lateral Q waves  Anteroseptal infarct, age indeterminate ST elevation, consider inferior injury No significant change since last tracing Confirmed by Neomi Neptune [phone]) on 11/19/2023 12:05:06 AM         RADIOLOGY: My personal review and interpretation of imaging: Constipation.  I have personally reviewed all radiology reports.   CT ABDOMEN PELVIS W CONTRAST Result Date: 11/19/2023 CLINICAL DATA:  Abdominal pain EXAM: CT ABDOMEN AND PELVIS WITH CONTRAST TECHNIQUE: Multidetector CT imaging of the abdomen and pelvis was performed using the standard protocol following bolus administration of intravenous contrast. RADIATION DOSE REDUCTION: This exam was performed according to the departmental dose-optimization program which includes automated exposure control, adjustment of the mA and/or kV according to patient size and/or use of iterative reconstruction technique. CONTRAST:  OMNIPAQUE  IOHEXOL  300 MG/ML  SOLN COMPARISON:  CT 07/30/2023 FINDINGS: Lower chest: Lung bases demonstrate no acute airspace disease. Hepatobiliary: No calcified gallstone. 2 cm hemangioma within the anterior liver, unchanged. No biliary dilatation. Pancreas: Unremarkable. No pancreatic ductal dilatation or surrounding inflammatory changes. Spleen: Normal in size without focal abnormality. Adrenals/Urinary Tract: Adrenal glands are normal. No hydronephrosis. Subcentimeter hypodensities in the kidneys too small to further characterize. No imaging follow-up is recommended. Punctate nonobstructing stones lower pole left kidney. The bladder is normal Stomach/Bowel: The stomach is nonenlarged. No dilated small bowel. No acute bowel wall thickening. Large stools throughout the colon. Negative appendix. Vascular/Lymphatic: No significant vascular findings are present. No enlarged abdominal or pelvic lymph nodes. Reproductive: Uterus and bilateral adnexa are unremarkable. Other: Negative for pelvic effusion or free air Musculoskeletal: No acute or  suspicious osseous abnormality IMPRESSION: 1. No CT evidence for acute intra-abdominal or pelvic abnormality. 2. Large stool burden. 3. Punctate nonobstructing stones lower pole left kidney. Electronically Signed   By: Luke Bun M.D.   On: 11/19/2023 00:52     PROCEDURES:  Critical Care performed: No     .1-3 Lead EKG Interpretation  Performed by: Korby Ratay, Neptune SAILOR, DO Authorized by: Colie Josten, Neptune  N, DO     Interpretation: normal     ECG rate:  70   ECG rate assessment: normal     Rhythm: sinus rhythm     Ectopy: none     Conduction: normal       IMPRESSION / MDM / ASSESSMENT AND PLAN / ED COURSE  I reviewed the triage vital signs and the nursing notes.    Patient here with generalized abdominal pain, chest pain.  The patient is on the cardiac monitor to evaluate for evidence of arrhythmia and/or significant heart rate changes.   DIFFERENTIAL DIAGNOSIS (includes but not limited to):   Constipation, ileus, UTI, vaginitis, less likely appendicitis, colitis, doubt C. Difficile  Chest wall pain, low suspicion for ACS, PE, dissection   Patient's presentation is most consistent with acute presentation with potential threat to life or bodily function.   PLAN: Labs obtained from triage show no leukocytosis.  Normal LFTs, lipase.  Will obtain catheterized urine specimen as patient has to self cath at home.  Will also send urine culture given high risk for UTIs.  Will repeat pelvic swabs today to ensure her bacterial vaginosis has resolved.  Will obtain CT of the abdomen pelvis.  Will give IV fluids, pain and nausea medicine.  As for her chest pain, will obtain 1 troponin.  Indication for serial enzymes given pain ongoing for weeks and atypical.  EKG pending.   MEDICATIONS GIVEN IN ED: Medications  sodium chloride  0.9 % bolus 1,000 mL (0 mLs Intravenous Stopped 11/19/23 0043)  ondansetron  (ZOFRAN ) injection 4 mg (4 mg Intravenous Given 11/19/23 0002)  ketorolac  (TORADOL ) 30  MG/ML injection 30 mg (30 mg Intravenous Given 11/19/23 0003)  iohexol  (OMNIPAQUE ) 300 MG/ML solution 100 mL (100 mLs Intravenous Contrast Given 11/19/23 0025)     ED COURSE: CT scan reviewed and interpreted by myself and the radiologist and shows constipation.  No bowel obstruction or other acute abnormality.  Have offered multiple options for constipation and patient would like to proceed with enema here.  Urine shows no sign of infection.  Wet prep is negative.   3:05 AM  Pt has been able to pass a large amount of stool here.  I feel she is safe for discharge.  Recommended continuing MiraLAX  daily but increasing it to twice a day, starting Colace 100 mg twice a day, increasing water and fiber intake especially while on hydrocodone .  Will discharge with lactulose  as well if she needs it but we did discuss that this could cause her to have to use the bathroom very frequently which could be difficult due to her being nonweightbearing with the left lower extremity.  CONSULTS:  none   OUTSIDE RECORDS REVIEWED: Reviewed recent OB/GYN, urology, podiatry notes.       FINAL CLINICAL IMPRESSION(S) / ED DIAGNOSES   Final diagnoses:  Generalized abdominal pain  Drug-induced constipation     Rx / DC Orders   ED Discharge Orders          Ordered    docusate sodium  (COLACE) 100 MG capsule  2 times daily        11/19/23 0102    lactulose  (CHRONULAC ) 10 GM/15ML solution  Daily PRN        11/19/23 0253             Note:  This document was prepared using Dragon voice recognition software and may include unintentional dictation errors.   Ladarious Kresse, Josette SAILOR, DO 11/19/23 [phone]

## 2023-11-18 NOTE — ED Notes (Signed)
 Pt currently self-cathing

## 2023-11-18 NOTE — ED Notes (Signed)
 First nurse:  BIB by [facility] from home for belly pain. Started 5 days ago. Also has headache. 10/10 pain. Pt has HX of GERD and IBS. On oxy  due to surgery.. BM yesterday. Surgery on ankle Friday.  Vitals : 140/80 98% RA  70 HR  16 RR  Caox4, in no acute distress. Non -weight bearing for her ankle.

## 2023-11-19 ENCOUNTER — Emergency Department

## 2023-11-19 LAB — URINALYSIS, ROUTINE W REFLEX MICROSCOPIC
Bilirubin Urine: NEGATIVE
Glucose, UA: NEGATIVE mg/dL
Hgb urine dipstick: NEGATIVE
Ketones, ur: NEGATIVE mg/dL
Leukocytes,Ua: NEGATIVE
Nitrite: NEGATIVE
Protein, ur: NEGATIVE mg/dL
Specific Gravity, Urine: 1.008 (ref 1.005–1.030)
pH: 7 (ref 5.0–8.0)

## 2023-11-19 LAB — TROPONIN I (HIGH SENSITIVITY): Troponin I (High Sensitivity): 4 ng/L (ref <18)

## 2023-11-19 LAB — CHLAMYDIA/NGC RT PCR (ARMC ONLY)
Chlamydia Tr: NOT DETECTED
N gonorrhoeae: NOT DETECTED

## 2023-11-19 LAB — WET PREP, GENITAL
Clue Cells Wet Prep HPF POC: NONE SEEN
Sperm: NONE SEEN
Trich, Wet Prep: NONE SEEN
WBC, Wet Prep HPF POC: <10 (ref <10)
Yeast Wet Prep HPF POC: NONE SEEN

## 2023-11-19 MED ORDER — IOHEXOL 300 MG/ML  SOLN
100.0000 mL | Freq: Once | INTRAMUSCULAR | Status: AC | PRN
  Administered 2023-11-19: 100 mL via INTRAVENOUS

## 2023-11-19 MED ORDER — DOCUSATE SODIUM 100 MG PO CAPS: 100.0000 mg | ORAL_CAPSULE | Freq: Two times a day (BID) | ORAL | 0 refills | Status: DC

## 2023-11-19 MED ORDER — LACTULOSE 10 GM/15ML PO SOLN: 30.0000 g | Freq: Every day | ORAL | 0 refills | Status: DC | PRN

## 2023-11-19 NOTE — ED Notes (Signed)
 Gave ice pack per pt request

## 2023-11-19 NOTE — ED Notes (Signed)
 Pt given water

## 2023-11-19 NOTE — ED Notes (Signed)
 Pt states she is feeling much better and has been able to move her bowels since the enema.

## 2023-11-19 NOTE — ED Notes (Signed)
 of urine drained from pt bladder at this time.

## 2023-11-19 NOTE — Discharge Instructions (Addendum)
 I recommend that you increase your water and fiber intake. If you are not able to eat foods high in fiber, you may use Benefiber or Metamucil over-the-counter. I also recommend you use MiraLAX 1-2 times a day and Colace 100 mg twice a day to help with bowel movements. These medications are over the counter.  You may use other over-the-counter medications such as Dulcolax, Fleet enemas, magnesium citrate as needed for constipation. Please note that some of these medications may cause you to have abdominal cramping which is normal. If you develop severe abdominal pain, fever (temperature of 100.4 or higher), persistent vomiting, distention of your abdomen, unable to have a bowel movement for 5 days or are not passing gas, please return to the hospital.

## 2023-11-19 NOTE — ED Notes (Signed)
 Pt only able to tolerate about of soap suds enema. Pt sitting on bedside commode attempting to have BM.

## 2023-11-20 ENCOUNTER — Telehealth: Payer: Self-pay | Admitting: Podiatry

## 2023-11-20 LAB — URINE CULTURE: Culture: NO GROWTH

## 2023-11-20 NOTE — Telephone Encounter (Signed)
 Spoke to patient advised to keep appointment for tomorrow stated is feeling better was advised if having chest pain to go to emergency department.Patient states she thinks her chest pain is coming from her medical condition she has with chest wall.

## 2023-11-20 NOTE — Telephone Encounter (Signed)
 Patient is numb where she had the surgery.  Leg is shaking and chest is hurting.  Does have post op appt tomorrow.

## 2023-11-21 ENCOUNTER — Ambulatory Visit (INDEPENDENT_AMBULATORY_CARE_PROVIDER_SITE_OTHER): Admitting: Podiatry

## 2023-11-21 VITALS — BP 124/65 | HR 68

## 2023-11-21 DIAGNOSIS — S86312D Strain of muscle(s) and tendon(s) of peroneal muscle group at lower leg level, left leg, subsequent encounter: Secondary | ICD-10-CM

## 2023-11-21 NOTE — Progress Notes (Signed)
 She presents today for her first postop visit she is status post EPF with peroneal tendon repair and PRP injection with a cast.  She denies fever chills nausea muscle aches pains calf pain back pain chest pain shortness of breath.  States that she has had a headache.  Objective: Vital signs are stable blood pressure is 124/65 pulse is 68.  Cast is intact dry and clean toes are warm to the touch and have good sensation.  Freely movable passively and actively.  Cast is loose approximately snug distally.  Assessment: Well-healing surgical foot.  Plan: Follow-up with me in 1 to 2 weeks for cast change

## 2023-11-24 ENCOUNTER — Telehealth: Payer: Self-pay | Admitting: Podiatry

## 2023-11-24 ENCOUNTER — Encounter: Payer: Self-pay | Admitting: Podiatry

## 2023-11-24 NOTE — Telephone Encounter (Signed)
 Patient initially called on 8/8 with concern for nausea and headache. At that time time encouraged use of prescribed zofran  along with ibuprofen  and tylenol . She calls again today on 8/9 with concern for worsening surgical site pain.  Advised patient to continue ice and elevation. Try resuming her prescribed Norco and taking with the zofran  to see if this limits side effects.   She does report some left sided chest pain which has been ongoing for weeks, and she did go to the ED for this on 8/3 and had EKG done. She states it is not worsening. Did instruct her to go to the ED or contact cardiologist or PCP if this is persistent or worsening.

## 2023-11-26 ENCOUNTER — Ambulatory Visit: Admitting: Family

## 2023-11-26 ENCOUNTER — Ambulatory Visit (INDEPENDENT_AMBULATORY_CARE_PROVIDER_SITE_OTHER): Admitting: Podiatry

## 2023-11-26 DIAGNOSIS — S86312D Strain of muscle(s) and tendon(s) of peroneal muscle group at lower leg level, left leg, subsequent encounter: Secondary | ICD-10-CM | POA: Diagnosis not present

## 2023-11-26 NOTE — Progress Notes (Signed)
 She presents today for follow-up of her peroneal tendon repair which was performed on 1 August.  She states that it feels like it is rubbing and that she feels like she has got a sore in her incision.  Denies fever chills nausea vomiting muscle aches and pains.  Objective: Cast is intact dry and clean presents with a knee scooter and her sister today.  Cast was removed dry sterile dressing was dry and intact was removed demonstrates only mild erythema over the dorsum of the foot where she had some rubbing but no skin breakdown.  Staples are intact sutures are intact margins are coapting well.  Assessment: Well-healing surgical foot left.  Plan: Redressed today plenty of padding redressed with a cast fiberglass below-knee left.  Will remove in 2 to 3 weeks.

## 2023-11-27 ENCOUNTER — Encounter: Payer: Self-pay | Admitting: Family

## 2023-11-27 ENCOUNTER — Ambulatory Visit (INDEPENDENT_AMBULATORY_CARE_PROVIDER_SITE_OTHER): Admitting: Family

## 2023-11-27 VITALS — BP 124/74 | HR 73 | Temp 98.2°F | Ht 70.0 in | Wt 124.0 lb

## 2023-11-27 DIAGNOSIS — E785 Hyperlipidemia, unspecified: Secondary | ICD-10-CM | POA: Diagnosis not present

## 2023-11-27 DIAGNOSIS — F419 Anxiety disorder, unspecified: Secondary | ICD-10-CM

## 2023-11-27 DIAGNOSIS — Z87898 Personal history of other specified conditions: Secondary | ICD-10-CM

## 2023-11-27 NOTE — Progress Notes (Signed)
 11/28/2023 9:49 AM   Cristina Wade 1955-05-19 [id number]  Referring provider: Dineen Rollene MATSU, FNP [address] [place],  [place] [address]  Urological history: 1.  High risk hematuria - non-smoker - Workup (2015) - bilateral punctate stones - cysto (2019) - NED - non-contrast CT (2022) -punctate bilateral stones - cysto (2022) - NED - RUS (08/2022) - bilateral stones - PET scan ((09/2022) - NED - CTU (12/2022) - bilateral nephrolithiasis - RUS (06/2023) - bilateral nephrolithiasis - contrast CT (11/2023) -punctate lower pole kidney stones  2. Neurogenic bladder -UDS 2017 indicating nonobstructive urinary retention with poor bladder sensation and external sphincter dyssynergia -completed 12 weeks of PTNS -completed pelvic floor PT -manages with self cathing three times daily  3. Nephrolithiasis -non-contrast CT 2022 2 mm stone in the right kidney lower pole without hydronephrosis.  Three tiny stones in left kidney lower pole region without hydronephrosis -CTU (12/2022) -bilateral nephrolithiasis -RUS (06/2023) - bilateral non obstructing stones  - contrast CT (11/2023) -punctate lower pole kidney stones  4. rUTI's - cysto x 2 - NED - several CT's - large stool burden - CT cystogram (07/2023) -no findings for colovesicular fistula - MRI pelvis (08/2023) -no evidence for colovesicular fistula  Chief Complaint  Patient presents with   Establish Care   Urinary Tract Infection   HPI: Cristina Wade is a 53 y.o. woman who presents today for burning and hurting in the bladder, malodorous urine and chills after accidentally getting vaginal cream into the urethra with her sister, [name].  Previous records reviewed.   She was seen by OB/GYN on October 30, 2023 for abnormal vaginal itching, fever, chills, urinary urgency, blood in the urine and pelvic pain.  She was prescribed vaginal estrogen cream, prescribed Macrobid  100 mg twice daily for 5 days and triamcinolone   cream to apply vaginally.     She was also seen at [facility] emergency department for abdominal pain on November 19, 2023.  Contrast CT demonstrated punctate nonobstructing stones in the lower pole of the left kidney and a large stool burden.  UA and urine culture from the emergency department were negative.    She states that she accidentally got the vaginal estrogen cream in her urethra and it started burning right away.  She has been having dysuria for the last 7 days, urinary urgency and tenderness in the bladder.  She also is voiding smaller amounts and has an odor to the urine.  She is still self cathing.  CATH UA positive for nitrites, micro heme and bacteria   PMH: Past Medical History:  Diagnosis Date   Anxiety    Asthma    Chronic pelvic pain in female 2018   Cystitis    Cystocele    Endometrial polyp    Endometriosis    per pt report but never seen during surg   Frequent headaches    Gastritis    GERD (gastroesophageal reflux disease)    rare   Gross hematuria    History of kidney stones    Microscopic hematuria    Migraine    Migraines    Neuropathy    Osteopenia    Urinary disorder    UTI (lower urinary tract infection)     Surgical History: Past Surgical History:  Procedure Laterality Date   COLONOSCOPY N/A 11/05/2014   Procedure: COLONOSCOPY;  Surgeon: Lamar ONEIDA Holmes, MD;  Location: [facility] ENDOSCOPY;  Service: Endoscopy;  Laterality: N/A;   COLONOSCOPY  11/2016   Dr. Holmes  CYSTOSCOPY  2007   with biopsy    CYSTOSCOPY N/A 06/01/2016   Procedure: CYSTOSCOPY;  Surgeon: Glory High, MD;  Location: [facility] ORS;  Service: Gynecology;  Laterality: N/A;   DIAGNOSTIC LAPAROSCOPY     DILATATION & CURETTAGE/HYSTEROSCOPY WITH MYOSURE N/A 04/13/2015   Procedure: DILATATION & CURETTAGE/HYSTEROSCOPY WITH MYOSURE/POLYPECTOMY;  Surgeon: Mitzie BROCKS Ward, MD;  Location: [facility] ORS;  Service: Gynecology;  Laterality: N/A;   DILATATION & CURETTAGE/HYSTEROSCOPY WITH MYOSURE N/A  06/01/2016   Procedure: DILATATION & CURETTAGE/HYSTEROSCOPY WITH MYOSURE;  Surgeon: Glory High, MD;  Location: [facility] ORS;  Service: Gynecology;  Laterality: N/A;   DILATION AND CURETTAGE OF UTERUS     ESOPHAGOGASTRODUODENOSCOPY (EGD) WITH PROPOFOL  N/A 08/31/2022   Procedure: ESOPHAGOGASTRODUODENOSCOPY (EGD) WITH PROPOFOL ;  Surgeon: Onita Elspeth Sharper, DO;  Location: [facility] ENDOSCOPY;  Service: Gastroenterology;  Laterality: N/A;   GUM SURGERY     laproscopy  2007   POLYPECTOMY     endometrial    Home Medications:  Allergies as of 11/28/2023       Reactions   Milk (cow) Other (See Comments)   Milk-related Compounds    Omeprazole  Other (See Comments)   Pt reports Chest pain, HA and Dysuria. 01/05/22 patient states she is able to take omeprazole  now without a reaction   Penicillins Itching, Rash        Medication List        Accurate as of November 28, 2023  9:49 AM. If you have any questions, ask your nurse or doctor.          STOP taking these medications    clindamycin  150 MG capsule Commonly known as: CLEOCIN  Stopped by: Burnice Oestreicher   LORazepam 0.5 MG tablet Commonly known as: ATIVAN Stopped by: Rayhaan Huster   prochlorperazine  10 MG tablet Commonly known as: COMPAZINE  Stopped by: Georgia Baria       TAKE these medications    CALCIUM  1200 PO Take 2-3 tablets by mouth daily. Reported on 05/26/2015   cholecalciferol  10 MCG (400 UNIT) Tabs tablet Commonly known as: VITAMIN D3 Take 400 Units by mouth daily.   docusate sodium  100 MG capsule Commonly known as: Colace Take 1 capsule (100 mg total) by mouth 2 (two) times daily.   lactulose  10 GM/15ML solution Commonly known as: CHRONULAC  Take 45 mLs (30 g total) by mouth daily as needed for mild constipation.   MULTIVITAMIN ADULT PO Take 1 tablet by mouth daily.   naproxen  250 MG tablet Commonly known as: NAPROSYN  Take 1 tablet (250 mg total) by mouth 2 (two) times daily as needed (chest  wall pain).   nitrofurantoin  (macrocrystal-monohydrate) 100 MG capsule Commonly known as: MACROBID  Take 1 capsule (100 mg total) by mouth every 12 (twelve) hours. Started by: CLOTILDA CORNWALL   ondansetron  4 MG tablet Commonly known as: Zofran  Take 1 tablet (4 mg total) by mouth every 8 (eight) hours as needed.   pantoprazole  40 MG tablet Commonly known as: PROTONIX  Take 1 tablet (40 mg total) by mouth 2 (two) times daily before a meal.   polyethylene glycol 17 g packet Commonly known as: MIRALAX  / GLYCOLAX  Take 17 g by mouth daily.   PROBIOTIC DAILY PO Take 1 tablet by mouth daily.   psyllium 0.52 g capsule Commonly known as: REGULOID Take 0.52 g by mouth daily.   scopolamine  1 MG/3DAYS Commonly known as: TRANSDERM-SCOP Place 1 patch (1.5 mg total) onto the skin every 3 (three) days.   ZINC  OXIDE PO Take 1 tablet by mouth  daily.        Allergies:  Allergies  Allergen Reactions   Milk (Cow) Other (See Comments)   Milk-Related Compounds    Omeprazole  Other (See Comments)    Pt reports Chest pain, HA and Dysuria. 01/05/22 patient states she is able to take omeprazole  now without a reaction   Penicillins Itching and Rash    Family History: Family History  Problem Relation Age of Onset   Arthritis Mother    Heart disease Mother    Stroke Mother        TIA   Hypertension Mother    Diabetes Mother    Kidney cancer Mother 24   Heart failure Mother    Hyperlipidemia Mother    Colon cancer Father 56   Arthritis Father    Hyperlipidemia Father    Transient ischemic attack Father    Cancer - Colon Father 30   Prostate cancer Father 84   Valvular heart disease Father    Cancer Maternal Grandmother    Cancer Paternal Grandfather        prostate   Breast cancer Cousin 24       has contact    Social History:  reports that she has never smoked. She has never been exposed to tobacco smoke. She has never used smokeless tobacco. She reports that she does not  drink alcohol and does not use drugs.  ROS: Pertinent ROS in HPI  Physical Exam: BP 132/71 (BP Location: Left Arm, Patient Position: Sitting, Cuff Size: Normal)   Pulse 91   Temp 97.7 F (36.5 C) (Oral)   Ht 5' 10 (1.778 m)   Wt 126 lb (57.2 kg)   LMP 11/03/2021 (Exact Date)   SpO2 99%   BMI 18.08 kg/m   Constitutional:  Well nourished. Alert and oriented, No acute distress. HEENT: [place] AT, moist mucus membranes.  Trachea midline Cardiovascular: No clubbing, cyanosis, or edema. Respiratory: Normal respiratory effort, no increased work of breathing. Neurologic: Grossly intact, no focal deficits, moving all 4 extremities. Psychiatric: Normal mood and affect.    Laboratory Data: See EPIC and HPI  I have reviewed the labs.   Pertinent Imaging: N/A    Assessment & Plan:    1.  Neurogenic bladder - Continue self-catheterization  2. Chronic pelvic pain - May be secondary to constipation and urinary retention secondary to transverse myelitis or the transverse myelitis by itself, this makes it difficult to address because it is challenging to delineate what may be causing her symptoms - She will continue self cathing and work with her PCP and GI on managing the constipation  3. High risk hematuria - she has had several upper tract and lower tract imaging and cystoscopies with no worrisome findings  - continue to monitor - will obtain an urine cytology at her follow up visit in two months if she is asymptomatic  4. Vaginal atrophy - Continue vaginal estrogen cream as prescribed  5. rUTI's/colonization - UA with bacteria and microscopic hematuria today - She is having bladder pain and pain in the urethra today, so I will go ahead and prescribe Macrobid  100 mg twice daily and await on urine cultures - Hiprex was considered at one time, so will revisit this when she returns in two months   Return in about 2 months (around 01/28/2024) for Repeat UA and symptom  recheck.  These notes generated with voice recognition software. I apologize for typographical errors.  CLOTILDA HELON RIGGERS  [facility] [phone]  [address]  [address] [place], [place] [address] [phone]

## 2023-11-27 NOTE — Progress Notes (Signed)
 Assessment & Plan:  Hyperlipidemia, unspecified hyperlipidemia type Assessment & Plan: Anticipate stable.  Pending lipid panel.  Orders: -     VITAMIN D  25 Hydroxy (Vit-D Deficiency, Fractures); Future -     Hemoglobin A1c; Future -     Lipid panel; Future  History of prediabetes -     Hemoglobin A1c; Future  Anxiety Assessment & Plan: Chronic, stable.  GAD-7 score of 0.  Will follow       Return precautions given.   Risks, benefits, and alternatives of the medications and treatment plan prescribed today were discussed, and patient expressed understanding.   Education regarding symptom management and diagnosis given to patient on AVS either electronically or printed.  Return in about 6 months (around 05/29/2024) for Fasting labs in 2-3 weeks.  Rollene Northern, FNP  Subjective:    Patient ID: Cristina Wade, female    DOB: 1971-03-28, 53 y.o.   MRN: [id number]  CC: Cristina Wade is a 53 y.o. female who presents today for follow up.   HPI: HPI Discussed the use of AI scribe software for clinical note transcription with the patient, who gave verbal consent to proceed.  History of Present Illness   Cristina Wade is a 53 year old female who presents for follow-up after peroneal tendon repair surgery.  She underwent peroneal tendon repair surgery on August 1st, which included a PRP injection and application of a cast. She had follow up appointment yesterday.  She has a history of diastolic dysfunction resolved on recent echocardiogram results from July 1st showing a normal left ventricular ejection fraction and mild mitral valve prolapse.  She denies CP, sob.   She would like routine labs including a1c and cholesterol ordered.  Exercise limited is nonweightbearing at this time left foot.     Pap smear 03/24/2021 with [facility], negative for malignancy, negative HPV Mammogram up-to-date 7/28 /25 Allergies: Milk (cow), Milk-related compounds, Omeprazole , and Penicillins Current  Outpatient Medications on File Prior to Visit  Medication Sig Dispense Refill   Calcium  Carbonate-Vit D-Min (CALCIUM  1200 PO) Take 2-3 tablets by mouth daily. Reported on 05/26/2015     cholecalciferol  (VITAMIN D3) 10 MCG (400 UNIT) TABS tablet Take 400 Units by mouth daily.     clindamycin  (CLEOCIN ) 150 MG capsule Take 1 capsule (150 mg total) by mouth 3 (three) times daily. 30 capsule 0   docusate sodium  (COLACE) 100 MG capsule Take 1 capsule (100 mg total) by mouth 2 (two) times daily. 60 capsule 0   lactulose  (CHRONULAC ) 10 GM/15ML solution Take 45 mLs (30 g total) by mouth daily as needed for mild constipation. 236 mL 0   Multiple Vitamin (MULTIVITAMIN ADULT PO) Take 1 tablet by mouth daily.     naproxen  (NAPROSYN ) 250 MG tablet Take 1 tablet (250 mg total) by mouth 2 (two) times daily as needed (chest wall pain). 30 tablet 1   ondansetron  (ZOFRAN ) 4 MG tablet Take 1 tablet (4 mg total) by mouth every 8 (eight) hours as needed. 20 tablet 0   pantoprazole  (PROTONIX ) 40 MG tablet Take 1 tablet (40 mg total) by mouth 2 (two) times daily before a meal. 60 tablet 1   polyethylene glycol (MIRALAX  / GLYCOLAX ) 17 g packet Take 17 g by mouth daily.     Probiotic Product (PROBIOTIC DAILY PO) Take 1 tablet by mouth daily.     psyllium (REGULOID) 0.52 g capsule Take 0.52 g by mouth daily.     scopolamine  (TRANSDERM-SCOP) 1 MG/3DAYS Place 1  patch (1.5 mg total) onto the skin every 3 (three) days. 7 patch 0   ZINC  OXIDE PO Take 1 tablet by mouth daily.     No current facility-administered medications on file prior to visit.    Review of Systems  Constitutional:  Negative for chills and fever.  Respiratory:  Negative for cough.   Cardiovascular:  Negative for chest pain and palpitations.  Gastrointestinal:  Negative for nausea and vomiting.      Objective:    BP 124/74   Pulse 73   Temp 98.2 F (36.8 C) (Oral)   Ht 5' 10 (1.778 m)   Wt 124 lb (56.2 kg)   LMP 11/03/2021 (Exact Date)   SpO2  97%   BMI 17.79 kg/m  BP Readings from Last 3 Encounters:  11/27/23 124/74  11/21/23 124/65  11/19/23 (!) 145/60   Wt Readings from Last 3 Encounters:  11/27/23 124 lb (56.2 kg)  10/04/23 124 lb (56.2 kg)  10/03/23 122 lb 6.4 oz (55.5 kg)      11/27/2023    9:06 AM 10/04/2023   10:44 AM 04/17/2023   11:40 AM  Depression screen PHQ 2/9  Decreased Interest 0 0 0  Down, Depressed, Hopeless 0 0 0  PHQ - 2 Score 0 0 0  Altered sleeping 0 1   Tired, decreased energy 0 0   Change in appetite 0 0   Feeling bad or failure about yourself  0 0   Trouble concentrating 0 1   Moving slowly or fidgety/restless 0 0   Suicidal thoughts 0 0   PHQ-9 Score 0 2   Difficult doing work/chores Not difficult at all Not difficult at all       11/27/2023    9:06 AM 10/04/2023   10:58 AM 10/04/2023   10:44 AM 12/01/2022   10:10 AM  GAD 7 : Generalized Anxiety Score  Nervous, Anxious, on Edge 0 0 0 0  Control/stop worrying 0 0 0 0  Worry too much - different things 0 0 0 0  Trouble relaxing 0 1 0 0  Restless 0 1 0 0  Easily annoyed or irritable 0 1 0 0  Afraid - awful might happen 0 0 0 0  Total GAD 7 Score 0 3 0 0  Anxiety Difficulty Not difficult at all Not difficult at all Not difficult at all Not difficult at all       Physical Exam Vitals reviewed.  Constitutional:      Appearance: She is well-developed.  Eyes:     Conjunctiva/sclera: Conjunctivae normal.  Cardiovascular:     Rate and Rhythm: Normal rate and regular rhythm.     Pulses: Normal pulses.     Heart sounds: Normal heart sounds.  Pulmonary:     Effort: Pulmonary effort is normal.     Breath sounds: Normal breath sounds. No wheezing, rhonchi or rales.  Feet:     Comments: Left foot in cast Skin:    General: Skin is warm and dry.  Neurological:     Mental Status: She is alert.  Psychiatric:        Speech: Speech normal.        Behavior: Behavior normal.        Thought Content: Thought content normal.

## 2023-11-28 ENCOUNTER — Ambulatory Visit (INDEPENDENT_AMBULATORY_CARE_PROVIDER_SITE_OTHER): Admitting: Urology

## 2023-11-28 ENCOUNTER — Encounter: Payer: Self-pay | Admitting: Urology

## 2023-11-28 ENCOUNTER — Ambulatory Visit

## 2023-11-28 ENCOUNTER — Ambulatory Visit (INDEPENDENT_AMBULATORY_CARE_PROVIDER_SITE_OTHER): Admitting: Podiatry

## 2023-11-28 VITALS — BP 132/71 | HR 91 | Temp 97.7°F | Ht 70.0 in | Wt 126.0 lb

## 2023-11-28 DIAGNOSIS — R102 Pelvic and perineal pain: Secondary | ICD-10-CM | POA: Diagnosis not present

## 2023-11-28 DIAGNOSIS — R319 Hematuria, unspecified: Secondary | ICD-10-CM | POA: Diagnosis not present

## 2023-11-28 DIAGNOSIS — N319 Neuromuscular dysfunction of bladder, unspecified: Secondary | ICD-10-CM | POA: Diagnosis not present

## 2023-11-28 DIAGNOSIS — N952 Postmenopausal atrophic vaginitis: Secondary | ICD-10-CM

## 2023-11-28 DIAGNOSIS — N390 Urinary tract infection, site not specified: Secondary | ICD-10-CM

## 2023-11-28 DIAGNOSIS — G8929 Other chronic pain: Secondary | ICD-10-CM

## 2023-11-28 DIAGNOSIS — S86312D Strain of muscle(s) and tendon(s) of peroneal muscle group at lower leg level, left leg, subsequent encounter: Secondary | ICD-10-CM

## 2023-11-28 DIAGNOSIS — M25372 Other instability, left ankle: Secondary | ICD-10-CM

## 2023-11-28 LAB — URINALYSIS, COMPLETE
Bilirubin, UA: NEGATIVE
Glucose, UA: NEGATIVE
Ketones, UA: NEGATIVE
Leukocytes,UA: NEGATIVE
Nitrite, UA: POSITIVE — AB
Protein,UA: NEGATIVE
Specific Gravity, UA: <1.005 — ABNORMAL LOW (ref 1.005–1.030)
Urobilinogen, Ur: 0.2 mg/dL (ref 0.2–1.0)
pH, UA: 6 (ref 5.0–7.5)

## 2023-11-28 LAB — MICROSCOPIC EXAMINATION

## 2023-11-28 MED ORDER — NITROFURANTOIN MONOHYD MACRO 100 MG PO CAPS
100.0000 mg | ORAL_CAPSULE | Freq: Two times a day (BID) | ORAL | 0 refills | Status: AC
Start: 2023-11-28 — End: ?

## 2023-11-28 NOTE — Assessment & Plan Note (Signed)
 Anticipate stable.  Pending lipid panel.

## 2023-11-28 NOTE — Progress Notes (Signed)
 She presents today date of surgery 11/16/2023.  After we put the cast on her l left foot and ankle last Monday she fell off of her scooter hitting the foot.  She states that she concerned that she may have done some damage.  Objective: Vital signs are stable alert and oriented x 3 cast is more loose approximately held firm distally.  Her toes are mildly swollen but she has good sensation and is able to move her toes without pain.  Radiographs taken today demonstrate osseously mature individual staples are in place no fractures no acute findings osseously.  Assessment probable just a contusion after a fall with cast intact status post peroneal tendon repair.  Plan: Follow-up with her next scheduled appointment.  She will be cast removal boot application for 2 more weeks then tennis shoe with Tri-Lock brace.

## 2023-11-28 NOTE — Progress Notes (Signed)
 In and Out Catheterization  Patient is present today for a I & O catheterization due to urinary retention. Patient was cleaned and prepped in a sterile fashion with betadine  . A 14FR cath was inserted no complications were noted , 90ml of urine return was noted, urine yellow in color. A clean urine sample was collected for urinalysis and urine culter. Bladder was drained  And catheter was removed with out difficulty.    Performed by: Beauford Browner CCMA   Follow up/ Additional notes: follou up 2 months for repeat ua and symptom check

## 2023-11-28 NOTE — Patient Instructions (Signed)
 Nice to see you!

## 2023-11-28 NOTE — Assessment & Plan Note (Signed)
 Chronic, stable.  GAD-7 score of 0.  Will follow

## 2023-11-30 ENCOUNTER — Ambulatory Visit (INDEPENDENT_AMBULATORY_CARE_PROVIDER_SITE_OTHER): Admitting: Podiatry

## 2023-11-30 ENCOUNTER — Encounter: Payer: Self-pay | Admitting: Podiatry

## 2023-11-30 VITALS — Ht 70.0 in | Wt 126.0 lb

## 2023-11-30 DIAGNOSIS — S86312D Strain of muscle(s) and tendon(s) of peroneal muscle group at lower leg level, left leg, subsequent encounter: Secondary | ICD-10-CM

## 2023-11-30 NOTE — Progress Notes (Signed)
 Chief Complaint  Patient presents with   Foot Injury    Pt is here to f/u on left foot, called in after hours states cast was on tight and could not feel her foot, this morning she states she put ice on the foot twice and it feels a lot better then it did last night, does not want a cast change at this time.    Subjective:  Patient presents today status post peroneal tendon repair LLE.  DOS: 11/16/2023 with Dr. Verta.  Patient initially over the last few days had complaints that the fiberglass cast was too tight.  She says that over the last few days the cast is actually comfortable and no longer tight.  Past Medical History:  Diagnosis Date   Anxiety    Asthma    Chronic pelvic pain in female 2018   Cystitis    Cystocele    Endometrial polyp    Endometriosis    per pt report but never seen during surg   Frequent headaches    Gastritis    GERD (gastroesophageal reflux disease)    rare   Gross hematuria    History of kidney stones    Microscopic hematuria    Migraine    Migraines    Neuropathy    Osteopenia    Urinary disorder    UTI (lower urinary tract infection)     Past Surgical History:  Procedure Laterality Date   COLONOSCOPY N/A 11/05/2014   Procedure: COLONOSCOPY;  Surgeon: Lamar ONEIDA Holmes, MD;  Location: [facility] ENDOSCOPY;  Service: Endoscopy;  Laterality: N/A;   COLONOSCOPY  11/2016   Dr. Holmes   CYSTOSCOPY  2007   with biopsy    CYSTOSCOPY N/A 06/01/2016   Procedure: CYSTOSCOPY;  Surgeon: Glory High, MD;  Location: [facility] ORS;  Service: Gynecology;  Laterality: N/A;   DIAGNOSTIC LAPAROSCOPY     DILATATION & CURETTAGE/HYSTEROSCOPY WITH MYOSURE N/A 04/13/2015   Procedure: DILATATION & CURETTAGE/HYSTEROSCOPY WITH MYOSURE/POLYPECTOMY;  Surgeon: Mitzie BROCKS Ward, MD;  Location: [facility] ORS;  Service: Gynecology;  Laterality: N/A;   DILATATION & CURETTAGE/HYSTEROSCOPY WITH MYOSURE N/A 06/01/2016   Procedure: DILATATION & CURETTAGE/HYSTEROSCOPY WITH MYOSURE;   Surgeon: Glory High, MD;  Location: [facility] ORS;  Service: Gynecology;  Laterality: N/A;   DILATION AND CURETTAGE OF UTERUS     ESOPHAGOGASTRODUODENOSCOPY (EGD) WITH PROPOFOL  N/A 08/31/2022   Procedure: ESOPHAGOGASTRODUODENOSCOPY (EGD) WITH PROPOFOL ;  Surgeon: Onita Elspeth Sharper, DO;  Location: [facility] ENDOSCOPY;  Service: Gastroenterology;  Laterality: N/A;   GUM SURGERY     laproscopy  2007   POLYPECTOMY     endometrial    Allergies  Allergen Reactions   Milk (Cow) Other (See Comments)   Milk-Related Compounds    Omeprazole  Other (See Comments)    Pt reports Chest pain, HA and Dysuria. 01/05/22 patient states she is able to take omeprazole  now without a reaction   Penicillins Itching and Rash    Objective/Physical Exam Cast left intact today.  Capillary refill to the toes immediate.  She is able to wiggle the toes.  No paresthesias noted to the toes.  To finger looseness noted to the proximal portion of the cast  Assessment: 1. s/p peroneal tendon repair left.  Dr. Verta. DOS: 11/16/2023   Plan of Care:  -Patient was evaluated.  -Cast left intact today -Continue NWB using the knee scooter -Return to clinic next scheduled appointment with Dr. Verta Thresa EMERSON Janit, DPM [facility]  Dr. Thresa HERO.  Janit, DPM    [address] [place], [place] [address]                Office [phone]  Fax [phone]

## 2023-12-03 ENCOUNTER — Ambulatory Visit (INDEPENDENT_AMBULATORY_CARE_PROVIDER_SITE_OTHER): Admitting: Podiatry

## 2023-12-03 ENCOUNTER — Ambulatory Visit: Admitting: Podiatry

## 2023-12-03 ENCOUNTER — Other Ambulatory Visit: Payer: Self-pay

## 2023-12-03 ENCOUNTER — Emergency Department
Admission: EM | Admit: 2023-12-03 | Discharge: 2023-12-03 | Disposition: A | Discharge status: Home / Self Care | Source: Home / Self Care | Attending: Emergency Medicine | Admitting: Emergency Medicine

## 2023-12-03 DIAGNOSIS — N3000 Acute cystitis without hematuria: Secondary | ICD-10-CM | POA: Insufficient documentation

## 2023-12-03 DIAGNOSIS — J45909 Unspecified asthma, uncomplicated: Secondary | ICD-10-CM | POA: Insufficient documentation

## 2023-12-03 DIAGNOSIS — S86312D Strain of muscle(s) and tendon(s) of peroneal muscle group at lower leg level, left leg, subsequent encounter: Secondary | ICD-10-CM

## 2023-12-03 DIAGNOSIS — R519 Headache, unspecified: Secondary | ICD-10-CM | POA: Insufficient documentation

## 2023-12-03 DIAGNOSIS — M722 Plantar fascial fibromatosis: Secondary | ICD-10-CM

## 2023-12-03 DIAGNOSIS — N390 Urinary tract infection, site not specified: Secondary | ICD-10-CM | POA: Diagnosis not present

## 2023-12-03 DIAGNOSIS — T83511A Infection and inflammatory reaction due to indwelling urethral catheter, initial encounter: Secondary | ICD-10-CM | POA: Diagnosis not present

## 2023-12-03 LAB — URINALYSIS, ROUTINE W REFLEX MICROSCOPIC
Bilirubin Urine: NEGATIVE
Glucose, UA: NEGATIVE mg/dL
Hgb urine dipstick: NEGATIVE
Ketones, ur: NEGATIVE mg/dL
Nitrite: NEGATIVE
Protein, ur: NEGATIVE mg/dL
Specific Gravity, Urine: 1.008 (ref 1.005–1.030)
pH: 9 — ABNORMAL HIGH (ref 5.0–8.0)

## 2023-12-03 MED ORDER — PHENAZOPYRIDINE HCL 100 MG PO TABS: 100.0000 mg | ORAL_TABLET | Freq: Three times a day (TID) | ORAL | 0 refills | Status: DC | PRN

## 2023-12-03 MED ORDER — PHENAZOPYRIDINE HCL 200 MG PO TABS
200.0000 mg | ORAL_TABLET | Freq: Once | ORAL | Status: AC
  Administered 2023-12-03: 200 mg via ORAL
  Filled 2023-12-03: qty 1

## 2023-12-03 MED ORDER — SULFAMETHOXAZOLE-TRIMETHOPRIM 800-160 MG PO TABS: 1.0000 | ORAL_TABLET | Freq: Two times a day (BID) | ORAL | 0 refills | Status: DC

## 2023-12-03 NOTE — Discharge Instructions (Addendum)
 You are evaluated in the ED for urinary tract infection.  Your urinalysis reveals a UTI, a urine culture is pending.  You will need to complete your dosage of Macrobid , however a prescription for Bactrim  has been sent to your pharmacy for you to pick up.  Pyridium  has been sent to your pharmacy do not take more than 2 days.  This medication will turn your urine orange.  Do not be alarmed.  For your stated headache you can try Excedrin  over-the-counter at your local pharmacy, as discussed your symptoms are consistent with a tension headache.  Follow-up with your primary care provider for further evaluation.

## 2023-12-03 NOTE — ED Triage Notes (Signed)
 Pt comes with c/o possible UTI. Pt had urine done with her pcp and culture but hasn't received results yet. Pt stats this started  two weeks ago. Pt states pain burning and lower back.

## 2023-12-03 NOTE — ED Provider Notes (Signed)
 [facility] Emergency Department Provider Note     Event Date/Time   First MD Initiated Contact with Patient 12/03/23 2033     (approximate)   History   UTI   HPI  Cristina Wade is a 53 y.o. female with a history of UTI, cystocele, GERD, gastritis, chronic pelvic pain, migraine, asthma and anxiety presents to the ED for evaluation of burning with urination and headache x 2 weeks.  She was recently treated for a UTI with Macrobid  a week ago by her.  She has not completed the doses that she has 2 pills left over.  She reports a urine culture was sent but she has not received a phone call.  She reports her symptoms have continued and was evaluated 2 days ago for the same thing with her primary provider who prescribed ciprofloxacin .  She reports her podiatrist told her to not take the ciprofloxacin  due to her recent tendon tear repair.  Denies fever, nausea or vomiting.  Of note patient also reports a headache localized to her frontal lobe.  She endorses being in a lot of stress recently.  Describes it as a tight band around her head.  Denies vision changes, headache, or injury   \ Physical Exam   Triage Vital Signs: ED Triage Vitals [12/03/23 1849]  Encounter Vitals Group     BP (!) 145/81     Girls Systolic BP Percentile      Girls Diastolic BP Percentile      Boys Systolic BP Percentile      Boys Diastolic BP Percentile      Pulse Rate 77     Resp 18     Temp 98 F (36.7 C)     Temp src      SpO2 100 %     Weight      Height      Head Circumference      Peak Flow      Pain Score 7     Pain Loc      Pain Education      Exclude from Growth Chart     Most recent vital signs: Vitals:   12/03/23 1849 12/03/23 2137  BP: (!) 145/81 (!) 140/78  Pulse: 77 81  Resp: 18 18  Temp: 98 F (36.7 C)   SpO2: 100% 100%    General: Well appearing and comfortable. Alert and oriented. INAD.  Skin:  Warm, dry and intact. No rashes or lesions noted.      Head:  NCAT.  Eyes:  PERRLA. EOMI.  CV:  Good peripheral perfusion. RRR.  RESP:  Normal effort. LCTAB.  ABD:  No distention. Mild tenderness to palpation over suprapubic region.  No CVA tenderness bilaterally. NEURO: Cranial nerves intact. No focal deficits. Speech is clear. Sensation and motor function intact.    ED Results / Procedures / Treatments   Labs (all labs ordered are listed, but only abnormal results are displayed) Labs Reviewed  URINALYSIS, ROUTINE W REFLEX MICROSCOPIC - Abnormal; Notable for the following components:      Result Value   Color, Urine YELLOW (*)    APPearance CLOUDY (*)    pH 9.0 (*)    Leukocytes,Ua TRACE (*)    Bacteria, UA FEW (*)    All other components within normal limits  URINE CULTURE   No results found.  PROCEDURES:  Critical Care performed: No  Procedures  MEDICATIONS ORDERED IN ED: Medications  phenazopyridine  (PYRIDIUM ) tablet 200  mg (200 mg Oral Given 12/03/23 2126)    IMPRESSION / MDM / ASSESSMENT AND PLAN / ED COURSE  I reviewed the triage vital signs and the nursing notes.                               53 y.o. female presents to the emergency department for evaluation and treatment of dysuria. See HPI for further details.   Differential diagnosis includes, but is not limited to UTI, bladder spasm, pyelonephritis and nephrolithiasis considered but less likely  Patient's presentation is most consistent with acute complicated illness / injury requiring diagnostic workup.  Patient is alert and oriented.  She is hemodynamic stable and afebrile.  Physical exam findings are benign.  Pertinent for mild suprapubic tenderness on palpation consistent with urinary tract infection.  Urinalysis reveals trace leukocytes.  Urine culture pending.  Advised patient to continue Macrobid .  She has refused to take her ciprofloxacin .  I have sent in a prescription for Bactrim  however instructed the patient to pick Bactrim  up if symptoms persist  following Macrobid  dose.  Patient is in agreement with this plan.  Pyridium  given in ED and side effects of orange urine was explained to her she verbalized understanding.  She is in stable condition for discharge home and outpatient management.  ED return precaution discussed.  FINAL CLINICAL IMPRESSION(S) / ED DIAGNOSES   Final diagnoses:  Acute cystitis without hematuria   Rx / DC Orders   ED Discharge Orders          Ordered    sulfamethoxazole -trimethoprim  (BACTRIM  DS) 800-160 MG tablet  2 times daily        12/03/23 2104    phenazopyridine  (PYRIDIUM ) 100 MG tablet  3 times daily PRN        12/03/23 2106             Note:  This document was prepared using Dragon voice recognition software and may include unintentional dictation errors.    [name], [name], PA-C 12/03/23 2307    [name], MD 12/04/23 2200

## 2023-12-04 NOTE — Progress Notes (Signed)
  Subjective:  Patient ID: Cristina Wade, female    DOB: [birth year],  MRN: [id number]  Chief Complaint  Patient presents with   casting    Rm 9  Patient is here for recasting of the left foot due to discomfort. Suture and staples are intact with no swelling or redness.    DOS: 5 11/16/2023 Procedure: Left peroneal tendon repair, EPF  53 y.o. female returns for post-op check.  The cast was tight and felt like it was rubbing on the foot  Review of Systems: Negative except as noted in the HPI. Denies N/V/F/Ch.   Objective:  There were no vitals filed for this visit. There is no height or weight on file to calculate BMI. Constitutional Well developed. Well nourished.  Vascular Foot warm and well perfused. Capillary refill normal to all digits.  Calf is soft and supple, no posterior calf or knee pain, negative Homans' sign  Neurologic Normal speech. Oriented to person, place, and time. Epicritic sensation to light touch grossly present bilaterally.  Dermatologic Skin healing well without signs of infection. Skin edges well coapted without signs of infection.  Orthopedic: Mild tenderness to palpation noted about the surgical site.    Assessment:   1. Tear of peroneal tendon, left, subsequent encounter   2. Plantar fasciitis of left foot    Plan:  Patient was evaluated and treated and all questions answered.  S/p foot surgery left -Progressing as expected post-operatively.  Cast change performed today due to tightness in the cast.  EPF incision sutures were removed.  I discussed removing her staples she said she preferred to leave these in place until her next visit with Dr. Verta as discussed in September.  Maintain nonweightbearing in cast.  Dressing reapplied with povidone ointment to incision, Telfa nonadherent, cast padding and a well-padded below the knee fiberglass cast in slight plantarflexion and eversion.    Return if symptoms worsen or fail to improve.

## 2023-12-05 ENCOUNTER — Encounter: Admitting: Podiatry

## 2023-12-05 ENCOUNTER — Ambulatory Visit: Payer: Self-pay | Admitting: Urology

## 2023-12-05 ENCOUNTER — Encounter: Payer: Self-pay | Admitting: Emergency Medicine

## 2023-12-05 ENCOUNTER — Other Ambulatory Visit: Payer: Self-pay

## 2023-12-05 ENCOUNTER — Inpatient Hospital Stay
Admission: EM | Admit: 2023-12-05 | Discharge: 2023-12-07 | DRG: 699 | Disposition: A | Discharge status: Home / Self Care | Attending: Family Medicine | Admitting: Family Medicine

## 2023-12-05 DIAGNOSIS — N390 Urinary tract infection, site not specified: Principal | ICD-10-CM | POA: Diagnosis present

## 2023-12-05 DIAGNOSIS — N319 Neuromuscular dysfunction of bladder, unspecified: Secondary | ICD-10-CM | POA: Diagnosis present

## 2023-12-05 DIAGNOSIS — Z1624 Resistance to multiple antibiotics: Secondary | ICD-10-CM | POA: Diagnosis present

## 2023-12-05 DIAGNOSIS — J4520 Mild intermittent asthma, uncomplicated: Secondary | ICD-10-CM | POA: Diagnosis not present

## 2023-12-05 DIAGNOSIS — Z87440 Personal history of urinary (tract) infections: Secondary | ICD-10-CM

## 2023-12-05 DIAGNOSIS — K219 Gastro-esophageal reflux disease without esophagitis: Secondary | ICD-10-CM | POA: Diagnosis present

## 2023-12-05 DIAGNOSIS — Z880 Allergy status to penicillin: Secondary | ICD-10-CM

## 2023-12-05 DIAGNOSIS — Z681 Body mass index (BMI) 19 or less, adult: Secondary | ICD-10-CM

## 2023-12-05 DIAGNOSIS — E875 Hyperkalemia: Secondary | ICD-10-CM | POA: Diagnosis not present

## 2023-12-05 DIAGNOSIS — B961 Klebsiella pneumoniae [K. pneumoniae] as the cause of diseases classified elsewhere: Secondary | ICD-10-CM | POA: Diagnosis present

## 2023-12-05 DIAGNOSIS — E440 Moderate protein-calorie malnutrition: Secondary | ICD-10-CM | POA: Diagnosis present

## 2023-12-05 DIAGNOSIS — F418 Other specified anxiety disorders: Secondary | ICD-10-CM | POA: Diagnosis present

## 2023-12-05 DIAGNOSIS — Z833 Family history of diabetes mellitus: Secondary | ICD-10-CM

## 2023-12-05 DIAGNOSIS — J45909 Unspecified asthma, uncomplicated: Secondary | ICD-10-CM | POA: Diagnosis present

## 2023-12-05 DIAGNOSIS — T83511A Infection and inflammatory reaction due to indwelling urethral catheter, initial encounter: Principal | ICD-10-CM | POA: Diagnosis present

## 2023-12-05 DIAGNOSIS — T83518A Infection and inflammatory reaction due to other urinary catheter, initial encounter: Principal | ICD-10-CM | POA: Diagnosis present

## 2023-12-05 DIAGNOSIS — E785 Hyperlipidemia, unspecified: Secondary | ICD-10-CM | POA: Diagnosis present

## 2023-12-05 DIAGNOSIS — Z8249 Family history of ischemic heart disease and other diseases of the circulatory system: Secondary | ICD-10-CM

## 2023-12-05 LAB — COMPREHENSIVE METABOLIC PANEL WITH GFR
ALT: 18 U/L (ref 0–44)
AST: 20 U/L (ref 15–41)
Albumin: 4.4 g/dL (ref 3.5–5.0)
Alkaline Phosphatase: 47 U/L (ref 38–126)
Anion gap: 9 (ref 5–15)
BUN: 15 mg/dL (ref 6–20)
CO2: 26 mmol/L (ref 22–32)
Calcium: 9.8 mg/dL (ref 8.9–10.3)
Chloride: 107 mmol/L (ref 98–111)
Creatinine, Ser: 0.72 mg/dL (ref 0.44–1.00)
GFR, Estimated: >60 mL/min (ref >60)
Glucose, Bld: 107 mg/dL — ABNORMAL HIGH (ref 70–99)
Potassium: 5.3 mmol/L — ABNORMAL HIGH (ref 3.5–5.1)
Sodium: 142 mmol/L (ref 135–145)
Total Bilirubin: 0.6 mg/dL (ref 0.0–1.2)
Total Protein: 7.4 g/dL (ref 6.5–8.1)

## 2023-12-05 LAB — URINALYSIS, W/ REFLEX TO CULTURE (INFECTION SUSPECTED)
Bilirubin Urine: NEGATIVE
Glucose, UA: NEGATIVE mg/dL
Hgb urine dipstick: NEGATIVE
Ketones, ur: NEGATIVE mg/dL
Leukocytes,Ua: NEGATIVE
Nitrite: POSITIVE — AB
Protein, ur: NEGATIVE mg/dL
Specific Gravity, Urine: 1.008 (ref 1.005–1.030)
Squamous Epithelial / HPF: 0 /HPF (ref 0–5)
pH: 7 (ref 5.0–8.0)

## 2023-12-05 LAB — CBC WITH DIFFERENTIAL/PLATELET
Abs Immature Granulocytes: 0.02 K/uL (ref 0.00–0.07)
Basophils Absolute: 0 K/uL (ref 0.0–0.1)
Basophils Relative: 1 %
Eosinophils Absolute: 0 K/uL (ref 0.0–0.5)
Eosinophils Relative: 0 %
HCT: 41.3 % (ref 36.0–46.0)
Hemoglobin: 14 g/dL (ref 12.0–15.0)
Immature Granulocytes: 0 %
Lymphocytes Relative: 18 %
Lymphs Abs: 1.1 K/uL (ref 0.7–4.0)
MCH: 31.1 pg (ref 26.0–34.0)
MCHC: 33.9 g/dL (ref 30.0–36.0)
MCV: 91.8 fL (ref 80.0–100.0)
Monocytes Absolute: 0.5 K/uL (ref 0.1–1.0)
Monocytes Relative: 8 %
Neutro Abs: 4.6 K/uL (ref 1.7–7.7)
Neutrophils Relative %: 73 %
Platelets: 216 K/uL (ref 150–400)
RBC: 4.5 MIL/uL (ref 3.87–5.11)
RDW: 12.6 % (ref 11.5–15.5)
WBC: 6.3 K/uL (ref 4.0–10.5)
nRBC: 0 % (ref 0.0–0.2)

## 2023-12-05 LAB — CULTURE, URINE COMPREHENSIVE

## 2023-12-05 LAB — HIV ANTIBODY (ROUTINE TESTING W REFLEX): HIV Screen 4th Generation wRfx: NONREACTIVE

## 2023-12-05 LAB — LACTIC ACID, PLASMA: Lactic Acid, Venous: 1.5 mmol/L (ref 0.5–1.9)

## 2023-12-05 MED ORDER — SACCHAROMYCES BOULARDII 250 MG PO CAPS
250.0000 mg | ORAL_CAPSULE | Freq: Every day | ORAL | Status: DC
  Administered 2023-12-06 – 2023-12-07 (×2): 250 mg via ORAL
  Filled 2023-12-05 (×2): qty 1

## 2023-12-05 MED ORDER — PANTOPRAZOLE SODIUM 40 MG PO TBEC
40.0000 mg | DELAYED_RELEASE_TABLET | Freq: Two times a day (BID) | ORAL | Status: DC
  Administered 2023-12-06 – 2023-12-07 (×3): 40 mg via ORAL
  Filled 2023-12-05 (×3): qty 1

## 2023-12-05 MED ORDER — OXYCODONE-ACETAMINOPHEN 5-325 MG PO TABS: 1.0000 | ORAL_TABLET | ORAL | Status: DC | PRN

## 2023-12-05 MED ORDER — ADULT MULTIVITAMIN W/MINERALS CH
1.0000 | ORAL_TABLET | Freq: Every day | ORAL | Status: DC
  Administered 2023-12-06 – 2023-12-07 (×2): 1 via ORAL
  Filled 2023-12-05 (×2): qty 1

## 2023-12-05 MED ORDER — DM-GUAIFENESIN ER 30-600 MG PO TB12: 1.0000 | ORAL_TABLET | Freq: Two times a day (BID) | ORAL | Status: DC | PRN

## 2023-12-05 MED ORDER — ACETAMINOPHEN 325 MG PO TABS
650.0000 mg | ORAL_TABLET | Freq: Four times a day (QID) | ORAL | Status: DC | PRN
  Administered 2023-12-05 – 2023-12-06 (×2): 650 mg via ORAL
  Filled 2023-12-05 (×2): qty 2

## 2023-12-05 MED ORDER — SODIUM ZIRCONIUM CYCLOSILICATE 5 G PO PACK
5.0000 g | PACK | Freq: Once | ORAL | Status: AC
  Administered 2023-12-05: 5 g via ORAL
  Filled 2023-12-05: qty 1

## 2023-12-05 MED ORDER — ONDANSETRON HCL 4 MG/2ML IJ SOLN: 4.0000 mg | Freq: Three times a day (TID) | INTRAMUSCULAR | Status: DC | PRN

## 2023-12-05 MED ORDER — SODIUM CHLORIDE 0.9 % IV SOLN
1.0000 g | Freq: Three times a day (TID) | INTRAVENOUS | Status: DC
  Administered 2023-12-06 – 2023-12-07 (×4): 1 g via INTRAVENOUS
  Filled 2023-12-05 (×5): qty 20

## 2023-12-05 MED ORDER — PROBIOTIC DAILY PO CAPS: ORAL_CAPSULE | Freq: Every day | ORAL | Status: DC

## 2023-12-05 MED ORDER — SODIUM CHLORIDE 0.9 % IV SOLN
1.0000 g | Freq: Three times a day (TID) | INTRAVENOUS | Status: AC
  Administered 2023-12-05: 1 g via INTRAVENOUS
  Filled 2023-12-05: qty 20

## 2023-12-05 MED ORDER — CALCIUM CITRATE 950 (200 CA) MG PO TABS
200.0000 mg | ORAL_TABLET | Freq: Two times a day (BID) | ORAL | Status: DC
  Administered 2023-12-06 – 2023-12-07 (×3): 950 mg via ORAL
  Filled 2023-12-05 (×3): qty 1

## 2023-12-05 MED ORDER — VITAMIN D 25 MCG (1000 UNIT) PO TABS
1000.0000 [IU] | ORAL_TABLET | Freq: Every day | ORAL | Status: DC
  Administered 2023-12-06 – 2023-12-07 (×2): 1000 [IU] via ORAL
  Filled 2023-12-05 (×2): qty 1

## 2023-12-05 MED ORDER — FAMOTIDINE 20 MG PO TABS
20.0000 mg | ORAL_TABLET | Freq: Every day | ORAL | Status: DC
  Administered 2023-12-05 – 2023-12-06 (×2): 20 mg via ORAL
  Filled 2023-12-05 (×2): qty 1

## 2023-12-05 MED ORDER — ASPIRIN 81 MG PO TBEC
81.0000 mg | DELAYED_RELEASE_TABLET | Freq: Every day | ORAL | Status: DC
  Administered 2023-12-05 – 2023-12-06 (×2): 81 mg via ORAL
  Filled 2023-12-05 (×2): qty 1

## 2023-12-05 MED ORDER — ZINC OXIDE 15 MG PO TBDP: ORAL_TABLET | Freq: Every day | ORAL | Status: DC

## 2023-12-05 MED ORDER — ALBUTEROL SULFATE (2.5 MG/3ML) 0.083% IN NEBU: 3.0000 mL | INHALATION_SOLUTION | RESPIRATORY_TRACT | Status: DC | PRN

## 2023-12-05 MED ORDER — SODIUM CHLORIDE 0.9 % IV BOLUS
500.0000 mL | Freq: Once | INTRAVENOUS | Status: AC
  Administered 2023-12-05: 500 mL via INTRAVENOUS

## 2023-12-05 MED ORDER — ENOXAPARIN SODIUM 40 MG/0.4ML IJ SOSY
40.0000 mg | PREFILLED_SYRINGE | INTRAMUSCULAR | Status: DC
  Administered 2023-12-05 – 2023-12-06 (×2): 40 mg via SUBCUTANEOUS
  Filled 2023-12-05 (×2): qty 0.4

## 2023-12-05 MED ORDER — CALCIUM 1200 1200-1000 MG-UNIT PO CHEW: CHEWABLE_TABLET | Freq: Two times a day (BID) | ORAL | Status: DC

## 2023-12-05 MED ORDER — PSYLLIUM 95 % PO PACK
1.0000 | PACK | Freq: Every day | ORAL | Status: DC
  Administered 2023-12-06 – 2023-12-07 (×2): 1 via ORAL
  Filled 2023-12-05 (×2): qty 1

## 2023-12-05 NOTE — ED Provider Triage Note (Signed)
 Emergency Medicine Provider Triage Evaluation Note  Isa Hitz , a 53 y.o. female  was evaluated in triage.  Pt complains of suprapubic pain.  According to the patient symptoms started 2 weeks ago with suprapubic pain, nauseous, chills, unquantified fever, hematuria.  Patient was seen at a walk-in clinic they called her today notifying about UTI that needs to be treated only with IV antibiotics.    Review of Systems  Positive:  Negative:   Physical Exam  BP 137/83 (BP Location: Left Arm)   Pulse 83   Temp 98.3 F (36.8 C) (Oral)   Resp 18   LMP 11/03/2021 (Exact Date)   SpO2 97%  Gen:   Awake, no distress   Resp:  Normal effort MSK:   Moves extremities without difficulty  Other:  Bilateral CVA negative  Medical Decision Making  Medically screening exam initiated at 3:21 PM.  Appropriate orders placed.  Samanvitha Germany was informed that the remainder of the evaluation will be completed by another provider, this initial triage assessment does not replace that evaluation, and the importance of remaining in the ED until their evaluation is complete. Who was referred from walk-in clinic today due to a UTI that can be treated only with IV antibiotics.  Symptoms started 2 weeks ago with suprapubic pain, nauseous, chills.  Last 24 hours patient refers frequency.  Patient denies dysuria.  Per independent chart review patient has a culture positive for Klebsiella pneumoniae, susceptibilities are pending.  I did order CBC CMP lactate    Janit Kast, PA-C 12/05/23 1526

## 2023-12-05 NOTE — ED Notes (Signed)
 Patient assisted to use the restroom on several occasions. Patient able to use scooter and ambulate to the restroom without complications.

## 2023-12-05 NOTE — H&P (Signed)
 History and Physical    Cristina Wade FMW:[id number] DOB: 1970-07-08 DOA: 12/05/2023  Referring MD/NP/PA:   PCP: Dineen Rollene MATSU, FNP   Patient coming from:  The patient is coming from home.     Chief Complaint: Suprapubic abdominal pain  HPI: Cristina Wade is a 53 y.o. female with medical history significant of transverse myelitis, neurogenic bladder with self-catheterization, asthma, GERD, depression with anxiety, cystocele, pelvic pain, endometriosis, who presents with suprapubic abdominal pain.  Patient states that she has suprapubic pain in the past 2 weeks, no dysuria or burning with urination.  She was diagnosed with UTI.  Initially started on Macrobid  on 8/13, without improvement, then seen in the ED on 8/18, changed to Bactrim , still no improvement.  Patient was given prescription of ciprofloxacin , but was told not taking this medication by urologist since patient recently had left peroneal tendon tear with repair. Chart review revealed that her urine culture is positive for Klebsiella pneumonia with multidrug-resistant.   Data reviewed independently and ED Course: pt was found to have UA (hazy appearance, negative leukocyte, positive nitrite, rare bacteria, WBC 0/5), WBC 6.3, potassium 5.3, GFR> 60, lactic acid 1.5.  Temperature normal, blood pressure 137/83, heart rate 83, RR 18, oxygen saturation 100% on room air.  Patient is placed in MedSurg bed for observation.  EKG: I have personally reviewed.  Sinus rhythm, QTc 463, early R wave progression, nonspecific T wave change.   Review of Systems:   General: no fevers, chills, no body weight gain, has fatigue HEENT: no blurry vision, hearing changes or sore throat Respiratory: no dyspnea, coughing, wheezing CV: no chest pain, no palpitations GI: no nausea, vomiting, diarrhea, constipation.  Has suprapubic abdominal pain. GU: no dysuria, burning on urination, increased urinary frequency, hematuria  Ext: no leg edema Neuro: no  unilateral weakness, numbness, or tingling, no vision change or hearing loss Skin: no rash, no skin tear. MSK: No muscle spasm, no deformity, no limitation of range of movement in spin Heme: No easy bruising.  Travel history: No recent long distant travel.   Allergy:  Allergies  Allergen Reactions   Milk (Cow) Other (See Comments)   Milk-Related Compounds    Omeprazole  Other (See Comments)    Pt reports Chest pain, HA and Dysuria. 01/05/22 patient states she is able to take omeprazole  now without a reaction   Penicillins Itching and Rash    Past Medical History:  Diagnosis Date   Anxiety    Asthma    Chronic pelvic pain in female 2018   Cystitis    Cystocele    Endometrial polyp    Endometriosis    per pt report but never seen during surg   Frequent headaches    Gastritis    GERD (gastroesophageal reflux disease)    rare   Gross hematuria    History of kidney stones    Microscopic hematuria    Migraine    Migraines    Neuropathy    Osteopenia    Urinary disorder    UTI (lower urinary tract infection)     Past Surgical History:  Procedure Laterality Date   COLONOSCOPY N/A 11/05/2014   Procedure: COLONOSCOPY;  Surgeon: Lamar ONEIDA Holmes, MD;  Location: [facility] ENDOSCOPY;  Service: Endoscopy;  Laterality: N/A;   COLONOSCOPY  11/2016   Dr. Holmes   CYSTOSCOPY  2007   with biopsy    CYSTOSCOPY N/A 06/01/2016   Procedure: CYSTOSCOPY;  Surgeon: Glory High, MD;  Location: [facility] ORS;  Service: Gynecology;  Laterality: N/A;   DIAGNOSTIC LAPAROSCOPY     DILATATION & CURETTAGE/HYSTEROSCOPY WITH MYOSURE N/A 04/13/2015   Procedure: DILATATION & CURETTAGE/HYSTEROSCOPY WITH MYOSURE/POLYPECTOMY;  Surgeon: Mitzie BROCKS Ward, MD;  Location: [facility] ORS;  Service: Gynecology;  Laterality: N/A;   DILATATION & CURETTAGE/HYSTEROSCOPY WITH MYOSURE N/A 06/01/2016   Procedure: DILATATION & CURETTAGE/HYSTEROSCOPY WITH MYOSURE;  Surgeon: Glory High, MD;  Location: [facility] ORS;  Service:  Gynecology;  Laterality: N/A;   DILATION AND CURETTAGE OF UTERUS     ESOPHAGOGASTRODUODENOSCOPY (EGD) WITH PROPOFOL  N/A 08/31/2022   Procedure: ESOPHAGOGASTRODUODENOSCOPY (EGD) WITH PROPOFOL ;  Surgeon: Onita Elspeth Sharper, DO;  Location: [facility] ENDOSCOPY;  Service: Gastroenterology;  Laterality: N/A;   GUM SURGERY     laproscopy  2007   POLYPECTOMY     endometrial    Social History:  reports that she has never smoked. She has never been exposed to tobacco smoke. She has never used smokeless tobacco. She reports that she does not drink alcohol and does not use drugs.  Family History:  Family History  Problem Relation Age of Onset   Arthritis Mother    Heart disease Mother    Stroke Mother        TIA   Hypertension Mother    Diabetes Mother    Kidney cancer Mother 71   Heart failure Mother    Hyperlipidemia Mother    Colon cancer Father 19   Arthritis Father    Hyperlipidemia Father    Transient ischemic attack Father    Cancer - Colon Father 33   Prostate cancer Father 4   Valvular heart disease Father    Cancer Maternal Grandmother    Cancer Paternal Grandfather        prostate   Breast cancer Cousin 35       has contact     Prior to Admission medications   Medication Sig Start Date End Date Taking? Authorizing Provider  Calcium  Carbonate-Vit D-Min (CALCIUM  1200 PO) Take 2-3 tablets by mouth 2 (two) times daily. Reported on 05/26/2015   Yes [provider]  cholecalciferol  (VITAMIN D3) 10 MCG (400 UNIT) TABS tablet Take 400 Units by mouth daily.   Yes [provider]  Multiple Vitamin (MULTIVITAMIN ADULT PO) Take 1 tablet by mouth daily.   Yes [provider]  pantoprazole  (PROTONIX ) 40 MG tablet Take 1 tablet (40 mg total) by mouth 2 (two) times daily before a meal. 08/31/22  Yes Danford, Lonni SQUIBB, MD  polyethylene glycol (MIRALAX  / GLYCOLAX ) 17 g packet Take 17 g by mouth daily.   Yes [provider]  Probiotic Product (PROBIOTIC  DAILY PO) Take 1 tablet by mouth daily.   Yes [provider]  psyllium (REGULOID) 0.52 g capsule Take 0.52 g by mouth daily.   Yes [provider]  ZINC  OXIDE PO Take 1 tablet by mouth daily.   Yes [provider]  docusate sodium  (COLACE) 100 MG capsule Take 1 capsule (100 mg total) by mouth 2 (two) times daily. Patient not taking: Reported on 11/30/2023 11/19/23 12/19/23  Ward, Josette SAILOR, DO  lactulose  (CHRONULAC ) 10 GM/15ML solution Take 45 mLs (30 g total) by mouth daily as needed for mild constipation. Patient not taking: Reported on 11/30/2023 11/19/23   Ward, Josette SAILOR, DO  naproxen  (NAPROSYN ) 250 MG tablet Take 1 tablet (250 mg total) by mouth 2 (two) times daily as needed (chest wall pain). Patient not taking: Reported on 12/05/2023 10/03/23   End, Lonni, MD  nitrofurantoin , macrocrystal-monohydrate, (MACROBID )  100 MG capsule Take 1 capsule (100 mg total) by mouth every 12 (twelve) hours. 11/28/23   Helon Kirsch A, PA-C  ondansetron  (ZOFRAN ) 4 MG tablet Take 1 tablet (4 mg total) by mouth every 8 (eight) hours as needed. Patient not taking: Reported on 11/30/2023 11/14/23   Verta Jude T, DPM  phenazopyridine  (PYRIDIUM ) 100 MG tablet Take 1 tablet (100 mg total) by mouth 3 (three) times daily as needed for up to 2 days for pain. Patient not taking: Reported on 12/05/2023 12/03/23 12/05/23  [name] Monte A, PA-C  scopolamine  (TRANSDERM-SCOP) 1 MG/3DAYS Place 1 patch (1.5 mg total) onto the skin every 3 (three) days. Patient not taking: Reported on 12/05/2023 10/23/23   Dineen Rollene MATSU, FNP  sulfamethoxazole -trimethoprim  (BACTRIM  DS) 800-160 MG tablet Take 1 tablet by mouth 2 (two) times daily. Patient not taking: Reported on 12/05/2023 12/03/23   [name] Monte A, PA-C    Physical Exam: Vitals:   12/05/23 1522 12/05/23 1712 12/05/23 2003 12/05/23 2234  BP:    119/86  Pulse:    65  Resp:    17  Temp:   98.2 F (36.8 C)   TempSrc:   Oral   SpO2:  100%  98%   Weight: 57.2 kg     Height: 5' 10 (1.778 m)      General: Not in acute distress HEENT:       Eyes: PERRL, EOMI, no jaundice       ENT: No discharge from the ears and nose, no pharynx injection, no tonsillar enlargement.        Neck: No JVD, no bruit, no mass felt. Heme: No neck lymph node enlargement. Cardiac: S1/S2, RRR, No murmurs, No gallops or rubs. Respiratory: No rales, wheezing, rhonchi or rubs. GI: Soft, nondistended, has suprapubic tenderness, no rebound pain, no organomegaly, BS present. GU: No hematuria Ext: s/p of left peroneal tendon repair, with cast on. Musculoskeletal: No joint deformities, No joint redness or warmth, no limitation of ROM in spin. Skin: No rashes.  Neuro: Alert, oriented X3, cranial nerves II-XII grossly intact, moves all extremities normally.  Psych: Patient is not psychotic, no suicidal or hemocidal ideation.  Labs on Admission: I have personally reviewed following labs and imaging studies  CBC: Recent Labs  Lab 12/05/23 1524  WBC 6.3  NEUTROABS 4.6  HGB 14.0  HCT 41.3  MCV 91.8  PLT 216   Basic Metabolic Panel: Recent Labs  Lab 12/05/23 1524  NA 142  K 5.3*  CL 107  CO2 26  GLUCOSE 107*  BUN 15  CREATININE 0.72  CALCIUM  9.8   GFR: Estimated Creatinine Clearance: 71.7 mL/min (by C-G formula based on SCr of 0.72 mg/dL). Liver Function Tests: Recent Labs  Lab 12/05/23 1524  AST 20  ALT 18  ALKPHOS 47  BILITOT 0.6  PROT 7.4  ALBUMIN 4.4   No results for input(s): LIPASE, AMYLASE in the last 168 hours. No results for input(s): AMMONIA in the last 168 hours. Coagulation Profile: No results for input(s): INR, PROTIME in the last 168 hours. Cardiac Enzymes: No results for input(s): CKTOTAL, CKMB, CKMBINDEX, TROPONINI in the last 168 hours. BNP (last 3 results) No results for input(s): PROBNP in the last 8760 hours. HbA1C: No results for input(s): HGBA1C in the last 72 hours. CBG: No results for  input(s): GLUCAP in the last 168 hours. Lipid Profile: No results for input(s): CHOL, HDL, LDLCALC, TRIG, CHOLHDL, LDLDIRECT in the last 72 hours. Thyroid  Function Tests: No results  for input(s): TSH, T4TOTAL, FREET4, T3FREE, THYROIDAB in the last 72 hours. Anemia Panel: No results for input(s): VITAMINB12, FOLATE, FERRITIN, TIBC, IRON, RETICCTPCT in the last 72 hours. Urine analysis:    Component Value Date/Time   COLORURINE YELLOW (A) 12/05/2023 1801   APPEARANCEUR HAZY (A) 12/05/2023 1801   APPEARANCEUR Clear 11/28/2023 0910   LABSPEC 1.008 12/05/2023 1801   PHURINE 7.0 12/05/2023 1801   GLUCOSEU NEGATIVE 12/05/2023 1801   GLUCOSEU NEGATIVE 10/04/2023 1115   HGBUR NEGATIVE 12/05/2023 1801   BILIRUBINUR NEGATIVE 12/05/2023 1801   BILIRUBINUR Negative 11/28/2023 0910   KETONESUR NEGATIVE 12/05/2023 1801   PROTEINUR NEGATIVE 12/05/2023 1801   UROBILINOGEN 0.2 10/04/2023 1117   UROBILINOGEN 0.2 10/04/2023 1115   NITRITE POSITIVE (A) 12/05/2023 1801   LEUKOCYTESUR NEGATIVE 12/05/2023 1801   Sepsis Labs: @LABRCNTIP (procalcitonin:4,lacticidven:4) ) Recent Results (from the past 240 hours)  CULTURE, URINE COMPREHENSIVE     Status: Abnormal   Collection Time: 11/28/23  9:10 AM   Specimen: Urine   UR  Result Value Ref Range Status   Urine Culture, Comprehensive Final report (A)  Final   Organism ID, Bacteria Klebsiella pneumoniae (A)  Final    Comment: Susceptibility profile is consistent with a probable ESBL. Multi-Drug Resistant Organism Greater than 100,000 colony forming units per mL    ANTIMICROBIAL SUSCEPTIBILITY Comment  Final    Comment:       ** S = Susceptible; I = Intermediate; R = Resistant **                    P = Positive; N = Negative             MICS are expressed in micrograms per mL    Antibiotic                 RSLT#1    RSLT#2    RSLT#3    RSLT#4 Amoxicillin/Clavulanic Acid    I Ampicillin                      R Cefazolin                      R Cefepime                       R Cefoxitin                      S Cefpodoxime                    R Ceftriaxone                    R Ciprofloxacin                   R Ertapenem                      S Gentamicin                      S Levofloxacin                    R Meropenem                       S Nitrofurantoin                  R Piperacillin/Tazobactam  R Tetracycline                   R Tobramycin                     I Trimethoprim /Sulfa              R   Microscopic Examination     Status: Abnormal   Collection Time: 11/28/23  9:10 AM   Urine  Result Value Ref Range Status   WBC, UA 0-5 0 - 5 /hpf Final   RBC, Urine 3-10 (A) 0 - 2 /hpf Final   Epithelial Cells (non renal) 0-10 0 - 10 /hpf Final   Bacteria, UA Many (A) None seen/Few Final  Urine Culture     Status: Abnormal (Preliminary result)   Collection Time: 12/03/23  6:51 PM   Specimen: Urine, Random  Result Value Ref Range Status   Specimen Description   Final    URINE, RANDOM Performed at [facility], [address] [place], [place] [address]    Special Requests   Final    NONE Performed at [facility], [address] [place], [place] [address]    Culture (A)  Final    >=100,000 COLONIES/mL KLEBSIELLA PNEUMONIAE SUSCEPTIBILITIES TO FOLLOW Performed at [facility], [address], [place], [place] [address]    Report Status PENDING  Incomplete     Radiological Exams on Admission:   Assessment/Plan Principal Problem:   Complicated UTI (urinary tract infection) Active Problems:   Neurogenic bladder   GERD (gastroesophageal reflux disease)   Asthma   Hyperkalemia   Depression with anxiety   Protein-calorie malnutrition, moderate (HCC)   Assessment and Plan:  Complicated UTI (urinary tract infection): Patient failed outpatient antibiotic treatment.  No fever or leukocytosis, clinically not septic.  -Place in MedSurg bed for  observation - IV meropenem  - Follow-up urine culture  Neurogenic bladder -Self cath  GERD (gastroesophageal reflux disease) -Protonix  and Pepcid   Asthma: Stable -Bronchodilators as needed Mucinex   Hyperkalemia: Potassium 5.3 -5 g of Lokelma   Depression with anxiety: Patient is not taking medications currently -Observe closely  Protein-calorie malnutrition, moderate (HCC): Body weight 57.2 kg, BMI 18.08 - Nutrition consult  S/p left peroneal tendon repair by Dr. Verta on 11/16/2023: has with cast on.  Following up with podiatrist, last seen was on 8/13.  Per Dr. Doris note, cast is intact and continue NWB using the knee scooter  - As needed Percocet and Tylenol  for pain      DVT ppx: SQ Lovenox   Code Status: Full code   Family Communication:   Yes, patient's twin sister at bed side.  Disposition Plan:  Anticipate discharge back to previous environment  Consults called:  none  Admission status and Level of care: Med-Surg:    for obs   Dispo: The patient is from: Home              Anticipated d/c is to: Home              Anticipated d/c date is: 1 day              Patient currently is not medically stable to d/c.    Severity of Illness:  The appropriate patient status for this patient is OBSERVATION. Observation status is judged to be reasonable and necessary in order to provide the required intensity of service to ensure the patient's safety. The patient's presenting symptoms, physical exam findings,  and initial radiographic and laboratory data in the context of their medical condition is felt to place them at decreased risk for further clinical deterioration. Furthermore, it is anticipated that the patient will be medically stable for discharge from the hospital within 2 midnights of admission.        Date of Service 12/05/2023    Caleb Exon [facility]   If 7PM-7AM, please contact night-coverage [email or web address] 12/05/2023, 11:10 PM

## 2023-12-05 NOTE — ED Provider Notes (Signed)
 [facility] Provider Note    Event Date/Time   First MD Initiated Contact with Patient 12/05/23 1711     (approximate)   History   No chief complaint on file.  Patient to ED via POV for abnormal lab. Seen at [facility]- dx with UTI. Urine culture showing highly resistant organism and sent over for IV antibiotics. Unsure if fevers at home but having chills.   HPI Cristina Wade is a 53 y.o. female PMH transverse myelitis, hyperlipidemia, neurogenic bladder with self-catheterization presents for evaluation of abnormal lab results -Patient is been having UTI symptoms for about 3 weeks, primarily suprapubic discomfort.  Found to have UTI on 11/28/2023, started on antibiotics, subsequently grew Klebsiella.  Klebsiella has now shown sensitivities of multidrug resistance that.  Would not be treated with any oral antibiotic -Does note some nausea.  No fevers.  Has some ongoing suprapubic discomfort.     Physical Exam   Triage Vital Signs: ED Triage Vitals  Encounter Vitals Group     BP 12/05/23 1516 137/83     Girls Systolic BP Percentile --      Girls Diastolic BP Percentile --      Boys Systolic BP Percentile --      Boys Diastolic BP Percentile --      Pulse Rate 12/05/23 1516 83     Resp 12/05/23 1516 18     Temp 12/05/23 1516 98.3 F (36.8 C)     Temp Source 12/05/23 1516 Oral     SpO2 12/05/23 1516 97 %     Weight 12/05/23 1522 126 lb (57.2 kg)     Height 12/05/23 1522 5' 10 (1.778 m)     Head Circumference --      Peak Flow --      Pain Score 12/05/23 1522 7     Pain Loc --      Pain Education --      Exclude from Growth Chart --     Most recent vital signs: Vitals:   12/05/23 1712 12/05/23 2003  BP:    Pulse:    Resp:    Temp:  98.2 F (36.8 C)  SpO2: 100%      General: Awake, no distress.  CV:  Good peripheral perfusion. RRR, RP 2+ Resp:  Normal effort. CTAB Abd:  No distention. Nontender to deep palpation throughout, mild suprapubic  discomfort.  No significant CVAT.    ED Results / Procedures / Treatments   Labs (all labs ordered are listed, but only abnormal results are displayed) Labs Reviewed  COMPREHENSIVE METABOLIC PANEL WITH GFR - Abnormal; Notable for the following components:      Result Value   Potassium 5.3 (*)    Glucose, Bld 107 (*)    All other components within normal limits  URINALYSIS, W/ REFLEX TO CULTURE (INFECTION SUSPECTED) - Abnormal; Notable for the following components:   Color, Urine YELLOW (*)    APPearance HAZY (*)    Nitrite POSITIVE (*)    Bacteria, UA RARE (*)    All other components within normal limits  URINE CULTURE  LACTIC ACID, PLASMA  CBC WITH DIFFERENTIAL/PLATELET  HIV ANTIBODY (ROUTINE TESTING W REFLEX)  BASIC METABOLIC PANEL WITH GFR  CBC     EKG  N/a   RADIOLOGY N/a    PROCEDURES:  Critical Care performed: No  Procedures   MEDICATIONS ORDERED IN ED: Medications  meropenem  (MERREM ) 1 g in sodium chloride  0.9 % 100 mL IVPB (has  no administration in time range)  albuterol  (PROVENTIL ) (2.5 MG/3ML) 0.083% nebulizer solution 3 mL (has no administration in time range)  dextromethorphan-guaiFENesin  (MUCINEX  DM) 30-600 MG per 12 hr tablet 1 tablet (has no administration in time range)  ondansetron  (ZOFRAN ) injection 4 mg (has no administration in time range)  acetaminophen  (TYLENOL ) tablet 650 mg (has no administration in time range)  enoxaparin  (LOVENOX ) injection 40 mg (has no administration in time range)  aspirin  EC tablet 81 mg (has no administration in time range)  famotidine  (PEPCID ) tablet 20 mg (has no administration in time range)  pantoprazole  (PROTONIX ) EC tablet 40 mg (has no administration in time range)  psyllium (HYDROCIL/METAMUCIL) 1 packet (has no administration in time range)  cholecalciferol  (VITAMIN D3) 25 MCG (1000 UNIT) tablet 1,000 Units (has no administration in time range)  multivitamin with minerals tablet 1 tablet (has no  administration in time range)  saccharomyces boulardii (FLORASTOR) capsule 250 mg (has no administration in time range)  calcium  citrate (CALCITRATE - dosed in mg elemental calcium ) tablet 950 mg (has no administration in time range)  meropenem  (MERREM ) 1 g in sodium chloride  0.9 % 100 mL IVPB (has no administration in time range)  sodium chloride  0.9 % bolus 500 mL (500 mLs Intravenous New Bag/Given 12/05/23 1929)  sodium zirconium cyclosilicate  (LOKELMA ) packet 5 g (5 g Oral Given 12/05/23 1935)     IMPRESSION / MDM / ASSESSMENT AND PLAN / ED COURSE  I reviewed the triage vital signs and the nursing notes.                              DDX/MDM/AP: Differential diagnosis includes, but is not limited to, MDR UTI.  Do not clinically suspect sepsis at this time.   Plan: - Labs - Will discuss with pharmacy  Patient's presentation is most consistent with acute presentation with potential threat to life or bodily function.  The patient is on the cardiac monitor to evaluate for evidence of arrhythmia and/or significant heart rate changes.  ED course below.  Labs with no evidence of systemic infection.  Very mild hyperkalemia, treated with small bolus IV fluid.  Urinalysis remains nitrite positive with many bacteria.  Discussed with pharmacy who agrees there is no appropriate oral regimen and recommends treatment with meropenem .  Admitted to hospital service for IV antibiotic treatment of MDR UTI.  Clinical Course as of 12/05/23 2022  Wed Dec 05, 2023  1834 D/w pharmacy, recommend meropenem , no oral options for tx based on recent ucx [MM]  1839 Hospitalist consult order placed [MM]    Clinical Course User Index [MM] [name], MD     FINAL CLINICAL IMPRESSION(S) / ED DIAGNOSES   Final diagnoses:  UTI due to Klebsiella species     Rx / DC Orders   ED Discharge Orders     None        Note:  This document was prepared using Dragon voice recognition software and may  include unintentional dictation errors.   [name], MD 12/05/23 2022

## 2023-12-05 NOTE — ED Triage Notes (Signed)
 Patient to ED via POV for abnormal lab. Seen at [facility]- dx with UTI. Urine culture showing highly resistant organism and sent over for IV antibiotics. Unsure if fevers at home but having chills.

## 2023-12-06 DIAGNOSIS — N390 Urinary tract infection, site not specified: Secondary | ICD-10-CM | POA: Diagnosis present

## 2023-12-06 DIAGNOSIS — J45909 Unspecified asthma, uncomplicated: Secondary | ICD-10-CM | POA: Diagnosis present

## 2023-12-06 DIAGNOSIS — Z1624 Resistance to multiple antibiotics: Secondary | ICD-10-CM | POA: Diagnosis present

## 2023-12-06 DIAGNOSIS — K5900 Constipation, unspecified: Secondary | ICD-10-CM | POA: Diagnosis not present

## 2023-12-06 DIAGNOSIS — E875 Hyperkalemia: Secondary | ICD-10-CM | POA: Diagnosis present

## 2023-12-06 DIAGNOSIS — E440 Moderate protein-calorie malnutrition: Secondary | ICD-10-CM | POA: Diagnosis present

## 2023-12-06 DIAGNOSIS — R140 Abdominal distension (gaseous): Secondary | ICD-10-CM | POA: Diagnosis not present

## 2023-12-06 DIAGNOSIS — N319 Neuromuscular dysfunction of bladder, unspecified: Secondary | ICD-10-CM | POA: Diagnosis present

## 2023-12-06 DIAGNOSIS — Z880 Allergy status to penicillin: Secondary | ICD-10-CM | POA: Diagnosis not present

## 2023-12-06 DIAGNOSIS — T83511A Infection and inflammatory reaction due to indwelling urethral catheter, initial encounter: Secondary | ICD-10-CM | POA: Diagnosis present

## 2023-12-06 DIAGNOSIS — Z87440 Personal history of urinary (tract) infections: Secondary | ICD-10-CM | POA: Diagnosis not present

## 2023-12-06 DIAGNOSIS — Z681 Body mass index (BMI) 19 or less, adult: Secondary | ICD-10-CM | POA: Diagnosis not present

## 2023-12-06 DIAGNOSIS — K219 Gastro-esophageal reflux disease without esophagitis: Secondary | ICD-10-CM | POA: Diagnosis present

## 2023-12-06 DIAGNOSIS — E785 Hyperlipidemia, unspecified: Secondary | ICD-10-CM | POA: Diagnosis present

## 2023-12-06 DIAGNOSIS — R1033 Periumbilical pain: Secondary | ICD-10-CM | POA: Diagnosis present

## 2023-12-06 DIAGNOSIS — R110 Nausea: Secondary | ICD-10-CM | POA: Diagnosis not present

## 2023-12-06 DIAGNOSIS — B961 Klebsiella pneumoniae [K. pneumoniae] as the cause of diseases classified elsewhere: Secondary | ICD-10-CM | POA: Diagnosis present

## 2023-12-06 DIAGNOSIS — Z833 Family history of diabetes mellitus: Secondary | ICD-10-CM | POA: Diagnosis not present

## 2023-12-06 DIAGNOSIS — Z8249 Family history of ischemic heart disease and other diseases of the circulatory system: Secondary | ICD-10-CM | POA: Diagnosis not present

## 2023-12-06 LAB — BASIC METABOLIC PANEL WITH GFR
Anion gap: 9 (ref 5–15)
BUN: 14 mg/dL (ref 6–20)
CO2: 27 mmol/L (ref 22–32)
Calcium: 9 mg/dL (ref 8.9–10.3)
Chloride: 107 mmol/L (ref 98–111)
Creatinine, Ser: 0.68 mg/dL (ref 0.44–1.00)
GFR, Estimated: >60 mL/min (ref >60)
Glucose, Bld: 96 mg/dL (ref 70–99)
Potassium: 3.9 mmol/L (ref 3.5–5.1)
Sodium: 143 mmol/L (ref 135–145)

## 2023-12-06 LAB — CBC
HCT: 35.2 % — ABNORMAL LOW (ref 36.0–46.0)
Hemoglobin: 11.9 g/dL — ABNORMAL LOW (ref 12.0–15.0)
MCH: 31.5 pg (ref 26.0–34.0)
MCHC: 33.8 g/dL (ref 30.0–36.0)
MCV: 93.1 fL (ref 80.0–100.0)
Platelets: 176 K/uL (ref 150–400)
RBC: 3.78 MIL/uL — ABNORMAL LOW (ref 3.87–5.11)
RDW: 12.8 % (ref 11.5–15.5)
WBC: 3.6 K/uL — ABNORMAL LOW (ref 4.0–10.5)
nRBC: 0 % (ref 0.0–0.2)

## 2023-12-06 LAB — URINE CULTURE: Culture: >=100000 — AB

## 2023-12-06 MED ORDER — SENNA 8.6 MG PO TABS
2.0000 | ORAL_TABLET | Freq: Every day | ORAL | Status: DC
  Administered 2023-12-06: 17.2 mg via ORAL

## 2023-12-06 MED ORDER — POLYETHYLENE GLYCOL 3350 17 G PO PACK
17.0000 g | PACK | Freq: Every day | ORAL | Status: DC
  Administered 2023-12-06 – 2023-12-07 (×2): 17 g via ORAL
  Filled 2023-12-06 (×2): qty 1

## 2023-12-06 MED ORDER — ENOXAPARIN SODIUM 40 MG/0.4ML IJ SOSY: 40.0000 mg | PREFILLED_SYRINGE | INTRAMUSCULAR | Status: DC

## 2023-12-06 NOTE — Plan of Care (Signed)
 Patient has reported headache and abdominal pain today (2-4/10)- she does not want pain medication due to causing constipation, she suffers from chronic constipation, voiding on her own without use of catheter, IV antibiotics given this shift, patient feels like she needs to poop- prune juice and PO home medications given, tolerating regular diet, VSS- no fever.  Elyn Sharps, RN 12/06/23 @1722 

## 2023-12-06 NOTE — Progress Notes (Signed)
 PROGRESS NOTE    Cristina Wade  FMW:[id number] DOB: 1970/06/20 DOA: 12/05/2023 PCP: Dineen Rollene MATSU, FNP   Brief Narrative:  HPI: Cristina Wade is a 53 y.o. female with medical history significant of transverse myelitis, neurogenic bladder with self-catheterization, asthma, GERD, depression with anxiety, cystocele, pelvic pain, endometriosis, who presents with suprapubic abdominal pain.   Patient states that she has suprapubic pain in the past 2 weeks, no dysuria or burning with urination.  She was diagnosed with UTI.  Initially started on Macrobid  on 8/13, without improvement, then seen in the ED on 8/18, changed to Bactrim , still no improvement.  Patient was given prescription of ciprofloxacin , but was told not taking this medication by urologist since patient recently had left peroneal tendon tear with repair. Chart review revealed that her urine culture is positive for Klebsiella pneumonia with multidrug-resistant.    Data reviewed independently and ED Course: pt was found to have UA (hazy appearance, negative leukocyte, positive nitrite, rare bacteria, WBC 0/5), WBC 6.3, potassium 5.3, GFR> 60, lactic acid 1.5.  Temperature normal, blood pressure 137/83, heart rate 83, RR 18, oxygen saturation 100% on room air.  Patient is placed in MedSurg bed for observation.   EKG: I have personally reviewed.  Sinus rhythm, QTc 463, early R wave progression, nonspecific T wave change.  Assessment & Plan:   Principal Problem:   Complicated UTI (urinary tract infection) Active Problems:   Neurogenic bladder   GERD (gastroesophageal reflux disease)   Asthma   Hyperkalemia   Depression with anxiety   Protein-calorie malnutrition, moderate (HCC)  Complicated UTI (urinary tract infection): Patient failed outpatient antibiotic treatment to.  Has history of frequent UTI in the past and has received several medications, resulting in MDRO Klebsiella pneumonia UTI.  Patient remains on Merrem .  Still complains  of suprapubic pain and has suprapubic tenderness so we will keep her 1 more day.  Will further discuss with her about options of IV antibiotics tomorrow.   Neurogenic bladder -Self cath   GERD (gastroesophageal reflux disease) -Protonix  and Pepcid    Asthma: Stable -Bronchodilators as needed Mucinex    Hyperkalemia: Resolved.   Depression with anxiety: Patient is not taking medications currently -Observe closely   Protein-calorie malnutrition, moderate (HCC): Body weight 57.2 kg, BMI 18.08 - Nutrition consult   S/p left peroneal tendon repair by Dr. Verta on 11/16/2023: has with cast on.  Following up with podiatrist, last seen was on 8/13.  Per Dr. Doris note, cast is intact and continue NWB using the knee scooter  - As needed Percocet and Tylenol  for pain  DVT prophylaxis: enoxaparin  (LOVENOX ) injection 40 mg Start: 12/05/23 2200   Code Status: Full Code  Family Communication:  None present at bedside.  Plan of care discussed with patient in length and he/she verbalized understanding and agreed with it.  Status is: Inpatient Remains inpatient appropriate because: Needs IV antibiotics.   Estimated body mass index is 18.37 kg/m as calculated from the following:   Height as of this encounter: 5' 10 (1.778 m).   Weight as of this encounter: 58.1 kg.    Nutritional Assessment: Body mass index is 18.37 kg/m.SABRA Seen by dietician.  I agree with the assessment and plan as outlined below: Nutrition Status: Nutrition Problem: Increased nutrient needs Etiology: acute illness Signs/Symptoms: estimated needs Interventions: MVI  . Skin Assessment: I have examined the patient's skin and I agree with the wound assessment as performed by the wound care RN as outlined below:    Consultants:  None  Procedures:  None  Antimicrobials:  Anti-infectives (From admission, onward)    Start     Dose/Rate Route Frequency Ordered Stop   12/06/23 0600  meropenem  (MERREM ) 1 g in sodium  chloride 0.9 % 100 mL IVPB        1 g 200 mL/hr over 30 Minutes Intravenous Every 8 hours 12/05/23 2021     12/05/23 2200  meropenem  (MERREM ) 1 g in sodium chloride  0.9 % 100 mL IVPB        1 g 200 mL/hr over 30 Minutes Intravenous Every 8 hours 12/05/23 1833 12/05/23 2159         Subjective: Patient seen and examined, she still complains of suprapubic pain but slightly better than yesterday.  No other complaint.  Objective: Vitals:   12/06/23 0357 12/06/23 0720 12/06/23 1110 12/06/23 1202  BP: 106/71 127/68  112/77  Pulse: (!) 58 60  69  Resp: 18 18  18   Temp: 98 F (36.7 C) 97.9 F (36.6 C)  98.4 F (36.9 C)  TempSrc: Oral Oral  Oral  SpO2: 100% 98%  97%  Weight:   58.1 kg   Height:   5' 10 (1.778 m)     Intake/Output Summary (Last 24 hours) at 12/06/2023 1308 Last data filed at 12/06/2023 1006 Gross per 24 hour  Intake 340 ml  Output --  Net 340 ml   Filed Weights   12/05/23 1522 12/06/23 1110  Weight: 57.2 kg 58.1 kg    Examination:  General exam: Appears calm and comfortable  Respiratory system: Clear to auscultation. Respiratory effort normal. Cardiovascular system: S1 & S2 heard, RRR. No JVD, murmurs, rubs, gallops or clicks. No pedal edema. Gastrointestinal system: Abdomen is nondistended, soft and mild suprapubic tenderness. No organomegaly or masses felt. Normal bowel sounds heard. Central nervous system: Alert and oriented. No focal neurological deficits. Extremities: Has cast in the left lower extremity. Psychiatry: Judgement and insight appear normal. Mood & affect appropriate.    Data Reviewed: I have personally reviewed following labs and imaging studies  CBC: Recent Labs  Lab 12/05/23 1524 12/06/23 0626  WBC 6.3 3.6*  NEUTROABS 4.6  --   HGB 14.0 11.9*  HCT 41.3 35.2*  MCV 91.8 93.1  PLT 216 176   Basic Metabolic Panel: Recent Labs  Lab 12/05/23 1524 12/06/23 0626  NA 142 143  K 5.3* 3.9  CL 107 107  CO2 26 27  GLUCOSE 107* 96   BUN 15 14  CREATININE 0.72 0.68  CALCIUM  9.8 9.0   GFR: Estimated Creatinine Clearance: 72.9 mL/min (by C-G formula based on SCr of 0.68 mg/dL). Liver Function Tests: Recent Labs  Lab 12/05/23 1524  AST 20  ALT 18  ALKPHOS 47  BILITOT 0.6  PROT 7.4  ALBUMIN 4.4   No results for input(s): LIPASE, AMYLASE in the last 168 hours. No results for input(s): AMMONIA in the last 168 hours. Coagulation Profile: No results for input(s): INR, PROTIME in the last 168 hours. Cardiac Enzymes: No results for input(s): CKTOTAL, CKMB, CKMBINDEX, TROPONINI in the last 168 hours. BNP (last 3 results) No results for input(s): PROBNP in the last 8760 hours. HbA1C: No results for input(s): HGBA1C in the last 72 hours. CBG: No results for input(s): GLUCAP in the last 168 hours. Lipid Profile: No results for input(s): CHOL, HDL, LDLCALC, TRIG, CHOLHDL, LDLDIRECT in the last 72 hours. Thyroid  Function Tests: No results for input(s): TSH, T4TOTAL, FREET4, T3FREE, THYROIDAB in the last 72 hours.  Anemia Panel: No results for input(s): VITAMINB12, FOLATE, FERRITIN, TIBC, IRON, RETICCTPCT in the last 72 hours. Sepsis Labs: Recent Labs  Lab 12/05/23 1524  LATICACIDVEN 1.5    Recent Results (from the past 240 hours)  CULTURE, URINE COMPREHENSIVE     Status: Abnormal   Collection Time: 11/28/23  9:10 AM   Specimen: Urine   UR  Result Value Ref Range Status   Urine Culture, Comprehensive Final report (A)  Final   Organism ID, Bacteria Klebsiella pneumoniae (A)  Final    Comment: Susceptibility profile is consistent with a probable ESBL. Multi-Drug Resistant Organism Greater than 100,000 colony forming units per mL    ANTIMICROBIAL SUSCEPTIBILITY Comment  Final    Comment:       ** S = Susceptible; I = Intermediate; R = Resistant **                    P = Positive; N = Negative             MICS are expressed in micrograms per mL     Antibiotic                 RSLT#1    RSLT#2    RSLT#3    RSLT#4 Amoxicillin/Clavulanic Acid    I Ampicillin                     R Cefazolin                      R Cefepime                       R Cefoxitin                      S Cefpodoxime                    R Ceftriaxone                    R Ciprofloxacin                   R Ertapenem                      S Gentamicin                      S Levofloxacin                    R Meropenem                       S Nitrofurantoin                  R Piperacillin/Tazobactam        R Tetracycline                   R Tobramycin                     I Trimethoprim /Sulfa              R   Microscopic Examination     Status: Abnormal   Collection Time: 11/28/23  9:10 AM   Urine  Result Value Ref Range Status   WBC, UA 0-5 0 - 5 /hpf Final   RBC, Urine 3-10 (A) 0 - 2 /hpf Final   Epithelial  Cells (non renal) 0-10 0 - 10 /hpf Final   Bacteria, UA Many (A) None seen/Few Final  Urine Culture     Status: Abnormal   Collection Time: 12/03/23  6:51 PM   Specimen: Urine, Random  Result Value Ref Range Status   Specimen Description   Final    URINE, RANDOM Performed at [facility], [address] [place], [place] [address]    Special Requests   Final    NONE Performed at [facility], [address] [place], [place] [address]    Culture (A)  Final    >=100,000 COLONIES/mL KLEBSIELLA PNEUMONIAE Confirmed Extended Spectrum Beta-Lactamase Producer (ESBL).  In bloodstream infections from ESBL organisms, carbapenems are preferred over piperacillin/tazobactam. They are shown to have a lower risk of mortality.    Report Status 12/06/2023 FINAL  Final   Organism ID, Bacteria KLEBSIELLA PNEUMONIAE (A)  Final      Susceptibility   Klebsiella pneumoniae - MIC*    AMPICILLIN >=32 RESISTANT Resistant     CEFAZOLIN (URINE) Value in next row Resistant      >=32 RESISTANTThis is a modified FDA-approved test that has been validated and its  performance characteristics determined by the reporting laboratory.  This laboratory is certified under the Clinical Laboratory Improvement Amendments CLIA as qualified to perform high complexity clinical laboratory testing.    CEFEPIME Value in next row Resistant      >=32 RESISTANTThis is a modified FDA-approved test that has been validated and its performance characteristics determined by the reporting laboratory.  This laboratory is certified under the Clinical Laboratory Improvement Amendments CLIA as qualified to perform high complexity clinical laboratory testing.    CEFTRIAXONE Value in next row Resistant      >=32 RESISTANTThis is a modified FDA-approved test that has been validated and its performance characteristics determined by the reporting laboratory.  This laboratory is certified under the Clinical Laboratory Improvement Amendments CLIA as qualified to perform high complexity clinical laboratory testing.    CIPROFLOXACIN  Value in next row Resistant      >=32 RESISTANTThis is a modified FDA-approved test that has been validated and its performance characteristics determined by the reporting laboratory.  This laboratory is certified under the Clinical Laboratory Improvement Amendments CLIA as qualified to perform high complexity clinical laboratory testing.    GENTAMICIN  Value in next row Sensitive      >=32 RESISTANTThis is a modified FDA-approved test that has been validated and its performance characteristics determined by the reporting laboratory.  This laboratory is certified under the Clinical Laboratory Improvement Amendments CLIA as qualified to perform high complexity clinical laboratory testing.    NITROFURANTOIN  Value in next row Resistant      >=32 RESISTANTThis is a modified FDA-approved test that has been validated and its performance characteristics determined by the reporting laboratory.  This laboratory is certified under the Clinical Laboratory Improvement Amendments CLIA as  qualified to perform high complexity clinical laboratory testing.    TRIMETH /SULFA  Value in next row Resistant      >=32 RESISTANTThis is a modified FDA-approved test that has been validated and its performance characteristics determined by the reporting laboratory.  This laboratory is certified under the Clinical Laboratory Improvement Amendments CLIA as qualified to perform high complexity clinical laboratory testing.    AMPICILLIN/SULBACTAM Value in next row Resistant      >=32 RESISTANTThis is a modified FDA-approved test that has been validated and its performance characteristics determined by the reporting laboratory.  This laboratory  is certified under the Clinical Laboratory Improvement Amendments CLIA as qualified to perform high complexity clinical laboratory testing.    PIP/TAZO Value in next row Resistant ug/mL     >=128 RESISTANTThis is a modified FDA-approved test that has been validated and its performance characteristics determined by the reporting laboratory.  This laboratory is certified under the Clinical Laboratory Improvement Amendments CLIA as qualified to perform high complexity clinical laboratory testing.    MEROPENEM  Value in next row Sensitive      >=128 RESISTANTThis is a modified FDA-approved test that has been validated and its performance characteristics determined by the reporting laboratory.  This laboratory is certified under the Clinical Laboratory Improvement Amendments CLIA as qualified to perform high complexity clinical laboratory testing.    * >=100,000 COLONIES/mL KLEBSIELLA PNEUMONIAE     Radiology Studies: No results found.  Scheduled Meds:  aspirin  EC  81 mg Oral Daily   calcium  citrate  200 mg of elemental calcium  Oral BID   cholecalciferol   1,000 Units Oral Daily   enoxaparin  (LOVENOX ) injection  40 mg Subcutaneous Q24H   famotidine   20 mg Oral Daily   multivitamin with minerals  1 tablet Oral Daily   pantoprazole   40 mg Oral BID AC   polyethylene  glycol  17 g Oral Daily   psyllium  1 packet Oral Daily   saccharomyces boulardii  250 mg Oral Q0600   senna  2 tablet Oral QHS   Continuous Infusions:  meropenem  (MERREM ) IV Stopped (12/06/23 0533)     LOS: 0 days   Fredia Skeeter, MD [facility]  12/06/2023, 1:08 PM   *Please note that this is a verbal dictation therefore any spelling or grammatical errors are due to the Dragon [employer] One system interpretation.  Please page via Amion and do not message via secure chat for urgent patient care matters. Secure chat can be used for non urgent patient care matters.  How to contact the [facility] Attending or Consulting provider 7A - 7P or covering provider during after hours 7P -7A, for this patient?  Check the care team in [facility] and look for a) attending/consulting [facility] provider listed and b) the [facility] team listed. Page or secure chat 7A-7P. Log into [email or web address] and use [facility] universal password to access. If you do not have the password, please contact the hospital operator. Locate the [facility] provider you are looking for under [facility] and page to a number that you can be directly reached. If you still have difficulty reaching the provider, please page the [facility] (Director on Call) for the Hospitalists listed on amion for assistance.

## 2023-12-06 NOTE — Progress Notes (Signed)
 Initial Nutrition Assessment  DOCUMENTATION CODES:   Underweight  INTERVENTION:   -Obtain new wt -MVI with minerals daily  NUTRITION DIAGNOSIS:   Increased nutrient needs related to acute illness as evidenced by estimated needs.  GOAL:   Patient will meet greater than or equal to 90% of their needs  MONITOR:   PO intake  REASON FOR ASSESSMENT:   Consult Assessment of nutrition requirement/status  ASSESSMENT:   Pt with medical history significant of transverse myelitis, neurogenic bladder with self-catheterization, asthma, GERD, depression with anxiety, cystocele, pelvic pain, endometriosis, who presents with suprapubic abdominal pain.  Pt admitted with complicated UTI.   Reviewed I/O's: +100 ml x 24 hours  Spoke with pt at bedside, who was pleasant and in good spirits today. She reports feeling better. She currently has a good appetite, awaiting breakfast. PTA, pt shares that she consumes 3 meals and 2-3 snacks daily, which her twin sister helps prepare. Meals typically consists of a meat, starch, and vegetable and snacks are anything and everything- cakes, cookies, and crackers. Pt also reports that she has been choosing healthier food choices by reducing the amount of sugar in her diet. She drinks mainly water.    Pt denies any weight loss. Pt reports she is usually small framed and petite and UBW is around 130#. Unsure of most recent wt is a stated wt vs measured wt; will obtain new wt to better assess weight changes. Pt reports she has always been thin and small framed.   Noted milk allergy in pt chart. Pt reports that she will sometimes consume milk and dairy and then if she consumes in large amounts her stomach hurts. Per pt, she also doesn't drink a lot of milk as she was told that it exacerbates mucous production. Based upon history; milk is not a true allergy and more consistent with lactose intolerance. Allergies updates.   Discussed importance of good meal  intake to promote healing.   Medications reviewed and include calcium  citrate, vitamin D3, lovenox , protonix , and flroastor.   Labs reviewed.    NUTRITION - FOCUSED PHYSICAL EXAM:  Flowsheet Row Most Recent Value  Orbital Region No depletion  Upper Arm Region Mild depletion  Thoracic and Lumbar Region No depletion  Buccal Region No depletion  Temple Region Mild depletion  Clavicle Bone Region Moderate depletion  Clavicle and Acromion Bone Region Moderate depletion  Scapular Bone Region Moderate depletion  Dorsal Hand Mild depletion  Patellar Region No depletion  Anterior Thigh Region No depletion  Posterior Calf Region No depletion  Edema (RD Assessment) None  Hair Reviewed  Eyes Reviewed  Mouth Reviewed  Skin Reviewed  Nails Reviewed    Diet Order:   Diet Order             Diet regular Fluid consistency: Thin  Diet effective now                   EDUCATION NEEDS:   Education needs have been addressed  Skin:  Skin Assessment: Reviewed RN Assessment  Last BM:  Unknown  Height:   Ht Readings from Last 1 Encounters:  12/05/23 5' 10 (1.778 m)    Weight:   Wt Readings from Last 1 Encounters:  12/05/23 57.2 kg    Ideal Body Weight:  68.2 kg  BMI:  Body mass index is 18.08 kg/m.  Estimated Nutritional Needs:   Kcal:  1800-2000  Protein:  90-105 grams  Fluid:  1.8-2.0 L    Margery ORN, RD,  LDN, CDCES Registered Dietitian III Certified Diabetes Care and Education Specialist If unable to reach this RD, please use RD Inpatient group chat on secure chat between hours of 8am-4 pm daily

## 2023-12-07 ENCOUNTER — Other Ambulatory Visit: Payer: Self-pay

## 2023-12-07 ENCOUNTER — Encounter: Payer: Self-pay | Admitting: Emergency Medicine

## 2023-12-07 DIAGNOSIS — R110 Nausea: Secondary | ICD-10-CM | POA: Insufficient documentation

## 2023-12-07 DIAGNOSIS — N390 Urinary tract infection, site not specified: Secondary | ICD-10-CM | POA: Diagnosis not present

## 2023-12-07 DIAGNOSIS — R140 Abdominal distension (gaseous): Secondary | ICD-10-CM | POA: Insufficient documentation

## 2023-12-07 DIAGNOSIS — R1033 Periumbilical pain: Secondary | ICD-10-CM | POA: Insufficient documentation

## 2023-12-07 DIAGNOSIS — K5900 Constipation, unspecified: Secondary | ICD-10-CM | POA: Insufficient documentation

## 2023-12-07 DIAGNOSIS — J45909 Unspecified asthma, uncomplicated: Secondary | ICD-10-CM | POA: Insufficient documentation

## 2023-12-07 DIAGNOSIS — K219 Gastro-esophageal reflux disease without esophagitis: Secondary | ICD-10-CM | POA: Insufficient documentation

## 2023-12-07 LAB — CBC WITH DIFFERENTIAL/PLATELET
Abs Immature Granulocytes: 0.02 K/uL (ref 0.00–0.07)
Basophils Absolute: 0 K/uL (ref 0.0–0.1)
Basophils Relative: 1 %
Eosinophils Absolute: 0.1 K/uL (ref 0.0–0.5)
Eosinophils Relative: 2 %
HCT: 41.3 % (ref 36.0–46.0)
Hemoglobin: 13.7 g/dL (ref 12.0–15.0)
Immature Granulocytes: 0 %
Lymphocytes Relative: 24 %
Lymphs Abs: 1.2 K/uL (ref 0.7–4.0)
MCH: 30.6 pg (ref 26.0–34.0)
MCHC: 33.2 g/dL (ref 30.0–36.0)
MCV: 92.2 fL (ref 80.0–100.0)
Monocytes Absolute: 0.4 K/uL (ref 0.1–1.0)
Monocytes Relative: 9 %
Neutro Abs: 3.1 K/uL (ref 1.7–7.7)
Neutrophils Relative %: 64 %
Platelets: 215 K/uL (ref 150–400)
RBC: 4.48 MIL/uL (ref 3.87–5.11)
RDW: 12.5 % (ref 11.5–15.5)
WBC: 4.8 K/uL (ref 4.0–10.5)
nRBC: 0 % (ref 0.0–0.2)

## 2023-12-07 LAB — URINE CULTURE: Culture: >=100000 — AB

## 2023-12-07 LAB — CBC
HCT: 41.5 % (ref 36.0–46.0)
Hemoglobin: 13.6 g/dL (ref 12.0–15.0)
MCH: 30.3 pg (ref 26.0–34.0)
MCHC: 32.8 g/dL (ref 30.0–36.0)
MCV: 92.4 fL (ref 80.0–100.0)
Platelets: 222 K/uL (ref 150–400)
RBC: 4.49 MIL/uL (ref 3.87–5.11)
RDW: 12.4 % (ref 11.5–15.5)
WBC: 6.4 K/uL (ref 4.0–10.5)
nRBC: 0 % (ref 0.0–0.2)

## 2023-12-07 LAB — BASIC METABOLIC PANEL WITH GFR
Anion gap: 10 (ref 5–15)
BUN: 16 mg/dL (ref 6–20)
CO2: 27 mmol/L (ref 22–32)
Calcium: 9.1 mg/dL (ref 8.9–10.3)
Chloride: 103 mmol/L (ref 98–111)
Creatinine, Ser: 0.72 mg/dL (ref 0.44–1.00)
GFR, Estimated: >60 mL/min (ref >60)
Glucose, Bld: 118 mg/dL — ABNORMAL HIGH (ref 70–99)
Potassium: 3.7 mmol/L (ref 3.5–5.1)
Sodium: 140 mmol/L (ref 135–145)

## 2023-12-07 MED ORDER — GENTAMICIN SULFATE 40 MG/ML IJ SOLN
300.0000 mg | Freq: Once | INTRAVENOUS | Status: AC
  Administered 2023-12-07: 300 mg via INTRAVENOUS
  Filled 2023-12-07: qty 7.5

## 2023-12-07 NOTE — Plan of Care (Signed)
 Patient discharged home with sister.  Discharge instructions, when to follow up, and prescriptions reviewed with patient.  Patient verbalized understanding. Patient will be escorted out by auxiliary.  Elyn Sharps, RN 12/07/23 @1419 

## 2023-12-07 NOTE — ED Triage Notes (Signed)
 Pt arrived via POV from home with reports of low abdominal pain, nausea, bloating, tightness, c/o feeling full, recently admitted for UTI, reports very little BMs.  Pt had CTAP on 8/4

## 2023-12-07 NOTE — Discharge Summary (Signed)
 Physician Discharge Summary  Cristina Wade FMW:[id number] DOB: 12-Jun-1970 DOA: 12/05/2023  PCP: Dineen Rollene MATSU, FNP  Admit date: 12/05/2023 Discharge date: 12/07/2023 30 Day Unplanned Readmission Risk Score    Flowsheet Row ED to Hosp-Admission (Current) from 12/05/2023 in [facility]  30 Day Unplanned Readmission Risk Score (%) 17.8 Filed at 12/07/2023 0801    This score is the patient's risk of an unplanned readmission within 30 days of being discharged (0 -100%). The score is based on dignosis, age, lab data, medications, orders, and past utilization.   Low:  0-14.9   Medium: 15-21.9   High: 22-29.9   Extreme: 30 and above          Admitted From: Home Disposition: Home  Recommendations for Outpatient Follow-up:  Follow up with PCP in 1-2 weeks Please obtain BMP/CBC in one week Please follow up with your PCP on the following pending results: Unresulted Labs (From admission, onward)     Start     Ordered   12/13/23 0500  Creatinine, serum  (enoxaparin  (LOVENOX )    CrCl >/= 30 ml/min)  Weekly,   TIMED     Comments: while on enoxaparin  therapy   Question:  Specimen collection method  Answer:  Lab=Lab collect   12/06/23 1128              Home Health: None Equipment/Devices: None  Discharge Condition: Stable CODE STATUS: Full code Diet recommendation:  Diet Order             Diet regular Fluid consistency: Thin  Diet effective now                 The following is the copied HPI and ED course by admitting hospitalist.  HPI: Cristina Wade is a 53 y.o. female with medical history significant of transverse myelitis, neurogenic bladder with self-catheterization, asthma, GERD, depression with anxiety, cystocele, pelvic pain, endometriosis, who presents with suprapubic abdominal pain.   Patient states that she has suprapubic pain in the past 2 weeks, no dysuria or burning with urination.  She was diagnosed with UTI.  Initially started  on Macrobid  on 8/13, without improvement, then seen in the ED on 8/18, changed to Bactrim , still no improvement.  Patient was given prescription of ciprofloxacin , but was told not taking this medication by urologist since patient recently had left peroneal tendon tear with repair. Chart review revealed that her urine culture is positive for Klebsiella pneumonia with multidrug-resistant.    Data reviewed independently and ED Course: pt was found to have UA (hazy appearance, negative leukocyte, positive nitrite, rare bacteria, WBC 0/5), WBC 6.3, potassium 5.3, GFR> 60, lactic acid 1.5.  Temperature normal, blood pressure 137/83, heart rate 83, RR 18, oxygen saturation 100% on room air.  Patient is placed in MedSurg bed for observation.   EKG: I have personally reviewed.  Sinus rhythm, QTc 463, early R wave progression, nonspecific T wave change.  Subjective: Seen and examined, she says that she feels  whole a lot better today with no suprapubic pain or abdominal pain.  No other complaint.  Brief/Interim Summary: Details of the hospitalization as below.  Complicated UTI (urinary tract infection): Patient failed outpatient antibiotic treatment tX.  Has history of frequent UTI in the past and has received several medications, resulting in MDRO Klebsiella pneumonia UTI which is only sensitive to gentamicin  and carbapenems.  She was started on meropenem  here.  She is asymptomatic now.  Discussed with pharmacy about the treatment  options.  Since she is medically stable for discharge, 1 option was to put PICC line in and discharged her with 4 days of IV ertapenem but per ID pharmacy, alternative option was to give her 1 dose of gentamicin  since she has received 3 days of IV ertapenem.  Discussed with the patient about both options, she chose the second option so we will go ahead with gentamicin  today and then discharge her.   Neurogenic bladder -Self cath   GERD (gastroesophageal reflux disease) -Protonix   and Pepcid    Asthma: Stable -Bronchodilators as needed Mucinex    Hyperkalemia: Resolved.   Depression with anxiety: Patient is not taking medications currently -Observe closely   Protein-calorie malnutrition, moderate (HCC): Body weight 57.2 kg, BMI 18.08 - Nutrition consult   S/p left peroneal tendon repair by Dr. Verta on 11/16/2023: has with cast on.  Following up with podiatrist, last seen was on 8/13.  Per Dr. Doris note, cast is intact and continue NWB using the knee scooter   Discharge plan was discussed with patient and/or family member and they verbalized understanding and agreed with it.  Discharge Diagnoses:  Principal Problem:   Complicated UTI (urinary tract infection) Active Problems:   Neurogenic bladder   GERD (gastroesophageal reflux disease)   Asthma   Hyperkalemia   Depression with anxiety   Protein-calorie malnutrition, moderate (HCC)    Discharge Instructions   Allergies as of 12/07/2023       Reactions   Lactose Intolerance (gi)    Pt consumes milk in recipes and dairy foods as as ice cream and yogurt, however, states her stomach will hurt if she consumes too much. She was also told milk produces excess mucous    Omeprazole  Other (See Comments)   Pt reports Chest pain, HA and Dysuria. 01/05/22 patient states she is able to take omeprazole  now without a reaction   Penicillins Itching, Rash        Medication List     STOP taking these medications    docusate sodium  100 MG capsule Commonly known as: Colace   lactulose  10 GM/15ML solution Commonly known as: CHRONULAC    naproxen  250 MG tablet Commonly known as: NAPROSYN    nitrofurantoin  (macrocrystal-monohydrate) 100 MG capsule Commonly known as: MACROBID    ondansetron  4 MG tablet Commonly known as: Zofran    phenazopyridine  100 MG tablet Commonly known as: PYRIDIUM    scopolamine  1 MG/3DAYS Commonly known as: TRANSDERM-SCOP   sulfamethoxazole -trimethoprim  800-160 MG  tablet Commonly known as: BACTRIM  DS       TAKE these medications    aspirin  EC 81 MG tablet Take 81 mg by mouth daily. Swallow whole.   CALCIUM  1200 PO Take 2-3 tablets by mouth 2 (two) times daily. Reported on 05/26/2015   cholecalciferol  10 MCG (400 UNIT) Tabs tablet Commonly known as: VITAMIN D3 Take 400 Units by mouth daily.   MULTIVITAMIN ADULT PO Take 1 tablet by mouth daily.   pantoprazole  40 MG tablet Commonly known as: PROTONIX  Take 1 tablet (40 mg total) by mouth 2 (two) times daily before a meal.   PEPCID  PO Take by mouth.   polyethylene glycol 17 g packet Commonly known as: MIRALAX  / GLYCOLAX  Take 17 g by mouth daily.   PROBIOTIC DAILY PO Take 1 tablet by mouth daily.   psyllium 0.52 g capsule Commonly known as: REGULOID Take 0.52 g by mouth daily.   ZINC  OXIDE PO Take 1 tablet by mouth daily.        Follow-up Information  Dineen Rollene MATSU, FNP Follow up in 1 week(s).   Specialty: Family Medicine Contact information: [address] [place] [address] [place] [address] [phone]                Allergies  Allergen Reactions   Lactose Intolerance (Gi)     Pt consumes milk in recipes and dairy foods as as ice cream and yogurt, however, states her stomach will hurt if she consumes too much. She was also told milk produces excess mucous    Omeprazole  Other (See Comments)    Pt reports Chest pain, HA and Dysuria. 01/05/22 patient states she is able to take omeprazole  now without a reaction   Penicillins Itching and Rash    Consultations: Curbside with ID   Procedures/Studies: CT ABDOMEN PELVIS W CONTRAST Result Date: 11/19/2023 CLINICAL DATA:  Abdominal pain EXAM: CT ABDOMEN AND PELVIS WITH CONTRAST TECHNIQUE: Multidetector CT imaging of the abdomen and pelvis was performed using the standard protocol following bolus administration of intravenous contrast. RADIATION DOSE REDUCTION: This exam was performed according to the  departmental dose-optimization program which includes automated exposure control, adjustment of the mA and/or kV according to patient size and/or use of iterative reconstruction technique. CONTRAST:  OMNIPAQUE  IOHEXOL  300 MG/ML  SOLN COMPARISON:  CT 07/30/2023 FINDINGS: Lower chest: Lung bases demonstrate no acute airspace disease. Hepatobiliary: No calcified gallstone. 2 cm hemangioma within the anterior liver, unchanged. No biliary dilatation. Pancreas: Unremarkable. No pancreatic ductal dilatation or surrounding inflammatory changes. Spleen: Normal in size without focal abnormality. Adrenals/Urinary Tract: Adrenal glands are normal. No hydronephrosis. Subcentimeter hypodensities in the kidneys too small to further characterize. No imaging follow-up is recommended. Punctate nonobstructing stones lower pole left kidney. The bladder is normal Stomach/Bowel: The stomach is nonenlarged. No dilated small bowel. No acute bowel wall thickening. Large stools throughout the colon. Negative appendix. Vascular/Lymphatic: No significant vascular findings are present. No enlarged abdominal or pelvic lymph nodes. Reproductive: Uterus and bilateral adnexa are unremarkable. Other: Negative for pelvic effusion or free air Musculoskeletal: No acute or suspicious osseous abnormality IMPRESSION: 1. No CT evidence for acute intra-abdominal or pelvic abnormality. 2. Large stool burden. 3. Punctate nonobstructing stones lower pole left kidney. Electronically Signed   By: Luke Bun M.D.   On: 11/19/2023 00:52   MM 3D SCREENING MAMMOGRAM BILATERAL BREAST Result Date: 11/14/2023 CLINICAL DATA:  Screening. EXAM: DIGITAL SCREENING BILATERAL MAMMOGRAM WITH TOMOSYNTHESIS AND CAD TECHNIQUE: Bilateral screening digital craniocaudal and mediolateral oblique mammograms were obtained. Bilateral screening digital breast tomosynthesis was performed. The images were evaluated with computer-aided detection. COMPARISON:  Previous exam(s).  ACR Breast Density Category c: The breasts are heterogeneously dense, which may obscure small masses. FINDINGS: There are no findings suspicious for malignancy. IMPRESSION: No mammographic evidence of malignancy. A result letter of this screening mammogram will be mailed directly to the patient. RECOMMENDATION: Screening mammogram in one year. (Code:[id number]-B-01Y) BI-RADS CATEGORY  1: Negative. Electronically Signed   By: Alm Parkins M.D.   On: 11/14/2023 15:13     Discharge Exam: Vitals:   12/07/23 0307 12/07/23 0730  BP: 108/68 119/85  Pulse: 63 67  Resp: 16 18  Temp: 97.9 F (36.6 C) 97.8 F (36.6 C)  SpO2: 96% 99%   Vitals:   12/06/23 1914 12/06/23 2257 12/07/23 0307 12/07/23 0730  BP: 124/69 124/83 108/68 119/85  Pulse: 75 67 63 67  Resp: 18 18 16 18   Temp: 97.9 F (36.6 C) 98 F (36.7 C) 97.9 F (36.6 C) 97.8 F (  36.6 C)  TempSrc: Oral Oral Oral Oral  SpO2: 99% 96% 96% 99%  Weight:      Height:        General: Pt is alert, awake, not in acute distress Cardiovascular: RRR, S1/S2 +, no rubs, no gallops Respiratory: CTA bilaterally, no wheezing, no rhonchi Abdominal: Soft, NT, ND, bowel sounds + Extremities: no edema, no cyanosis, she has cast in the left lower extremity    The results of significant diagnostics from this hospitalization (including imaging, microbiology, ancillary and laboratory) are listed below for reference.     Microbiology: Recent Results (from the past 240 hours)  CULTURE, URINE COMPREHENSIVE     Status: Abnormal   Collection Time: 11/28/23  9:10 AM   Specimen: Urine   UR  Result Value Ref Range Status   Urine Culture, Comprehensive Final report (A)  Final   Organism ID, Bacteria Klebsiella pneumoniae (A)  Final    Comment: Susceptibility profile is consistent with a probable ESBL. Multi-Drug Resistant Organism Greater than 100,000 colony forming units per mL    ANTIMICROBIAL SUSCEPTIBILITY Comment  Final    Comment:       ** S =  Susceptible; I = Intermediate; R = Resistant **                    P = Positive; N = Negative             MICS are expressed in micrograms per mL    Antibiotic                 RSLT#1    RSLT#2    RSLT#3    RSLT#4 Amoxicillin/Clavulanic Acid    I Ampicillin                     R Cefazolin                      R Cefepime                       R Cefoxitin                      S Cefpodoxime                    R Ceftriaxone                    R Ciprofloxacin                   R Ertapenem                      S Gentamicin                      S Levofloxacin                    R Meropenem                       S Nitrofurantoin                  R Piperacillin/Tazobactam        R Tetracycline                   R Tobramycin  I Trimethoprim /Sulfa              R   Microscopic Examination     Status: Abnormal   Collection Time: 11/28/23  9:10 AM   Urine  Result Value Ref Range Status   WBC, UA 0-5 0 - 5 /hpf Final   RBC, Urine 3-10 (A) 0 - 2 /hpf Final   Epithelial Cells (non renal) 0-10 0 - 10 /hpf Final   Bacteria, UA Many (A) None seen/Few Final  Urine Culture     Status: Abnormal   Collection Time: 12/03/23  6:51 PM   Specimen: Urine, Random  Result Value Ref Range Status   Specimen Description   Final    URINE, RANDOM Performed at [facility], [address], [place], [place] [address]    Special Requests   Final    NONE Performed at [facility], [address] [place], [place] [address]    Culture (A)  Final    >=100,000 COLONIES/mL KLEBSIELLA PNEUMONIAE Confirmed Extended Spectrum Beta-Lactamase Producer (ESBL).  In bloodstream infections from ESBL organisms, carbapenems are preferred over piperacillin/tazobactam. They are shown to have a lower risk of mortality.    Report Status 12/06/2023 FINAL  Final   Organism ID, Bacteria KLEBSIELLA PNEUMONIAE (A)  Final      Susceptibility   Klebsiella pneumoniae - MIC*    AMPICILLIN >=32 RESISTANT  Resistant     CEFAZOLIN (URINE) Value in next row Resistant      >=32 RESISTANTThis is a modified FDA-approved test that has been validated and its performance characteristics determined by the reporting laboratory.  This laboratory is certified under the Clinical Laboratory Improvement Amendments CLIA as qualified to perform high complexity clinical laboratory testing.    CEFEPIME Value in next row Resistant      >=32 RESISTANTThis is a modified FDA-approved test that has been validated and its performance characteristics determined by the reporting laboratory.  This laboratory is certified under the Clinical Laboratory Improvement Amendments CLIA as qualified to perform high complexity clinical laboratory testing.    CEFTRIAXONE Value in next row Resistant      >=32 RESISTANTThis is a modified FDA-approved test that has been validated and its performance characteristics determined by the reporting laboratory.  This laboratory is certified under the Clinical Laboratory Improvement Amendments CLIA as qualified to perform high complexity clinical laboratory testing.    CIPROFLOXACIN  Value in next row Resistant      >=32 RESISTANTThis is a modified FDA-approved test that has been validated and its performance characteristics determined by the reporting laboratory.  This laboratory is certified under the Clinical Laboratory Improvement Amendments CLIA as qualified to perform high complexity clinical laboratory testing.    GENTAMICIN  Value in next row Sensitive      >=32 RESISTANTThis is a modified FDA-approved test that has been validated and its performance characteristics determined by the reporting laboratory.  This laboratory is certified under the Clinical Laboratory Improvement Amendments CLIA as qualified to perform high complexity clinical laboratory testing.    NITROFURANTOIN  Value in next row Resistant      >=32 RESISTANTThis is a modified FDA-approved test that has been validated and its  performance characteristics determined by the reporting laboratory.  This laboratory is certified under the Clinical Laboratory Improvement Amendments CLIA as qualified to perform high complexity clinical laboratory testing.    TRIMETH /SULFA  Value in next row Resistant      >=32 RESISTANTThis is a modified FDA-approved test that has been validated and  its performance characteristics determined by the reporting laboratory.  This laboratory is certified under the Clinical Laboratory Improvement Amendments CLIA as qualified to perform high complexity clinical laboratory testing.    AMPICILLIN/SULBACTAM Value in next row Resistant      >=32 RESISTANTThis is a modified FDA-approved test that has been validated and its performance characteristics determined by the reporting laboratory.  This laboratory is certified under the Clinical Laboratory Improvement Amendments CLIA as qualified to perform high complexity clinical laboratory testing.    PIP/TAZO Value in next row Resistant ug/mL     >=128 RESISTANTThis is a modified FDA-approved test that has been validated and its performance characteristics determined by the reporting laboratory.  This laboratory is certified under the Clinical Laboratory Improvement Amendments CLIA as qualified to perform high complexity clinical laboratory testing.    MEROPENEM  Value in next row Sensitive      >=128 RESISTANTThis is a modified FDA-approved test that has been validated and its performance characteristics determined by the reporting laboratory.  This laboratory is certified under the Clinical Laboratory Improvement Amendments CLIA as qualified to perform high complexity clinical laboratory testing.    * >=100,000 COLONIES/mL KLEBSIELLA PNEUMONIAE  Urine Culture (for pregnant, neutropenic or urologic patients or patients with an indwelling urinary catheter)     Status: Abnormal   Collection Time: 12/05/23  6:01 PM   Specimen: Urine, Clean Catch  Result Value Ref Range  Status   Specimen Description   Final    URINE, CLEAN CATCH Performed at [facility], [address] [place], [place] [address]    Special Requests   Final    NONE Performed at [facility], [address] [place], [place] [address]    Culture (A)  Final    >=100,000 COLONIES/mL KLEBSIELLA PNEUMONIAE Confirmed Extended Spectrum Beta-Lactamase Producer (ESBL).  In bloodstream infections from ESBL organisms, carbapenems are preferred over piperacillin/tazobactam. They are shown to have a lower risk of mortality.    Report Status 12/07/2023 FINAL  Final   Organism ID, Bacteria KLEBSIELLA PNEUMONIAE (A)  Final      Susceptibility   Klebsiella pneumoniae - MIC*    AMPICILLIN >=32 RESISTANT Resistant     CEFAZOLIN (URINE) Value in next row Resistant      >=32 RESISTANTThis is a modified FDA-approved test that has been validated and its performance characteristics determined by the reporting laboratory.  This laboratory is certified under the Clinical Laboratory Improvement Amendments CLIA as qualified to perform high complexity clinical laboratory testing.    CEFEPIME Value in next row Resistant      >=32 RESISTANTThis is a modified FDA-approved test that has been validated and its performance characteristics determined by the reporting laboratory.  This laboratory is certified under the Clinical Laboratory Improvement Amendments CLIA as qualified to perform high complexity clinical laboratory testing.    CEFTRIAXONE Value in next row Resistant      >=32 RESISTANTThis is a modified FDA-approved test that has been validated and its performance characteristics determined by the reporting laboratory.  This laboratory is certified under the Clinical Laboratory Improvement Amendments CLIA as qualified to perform high complexity clinical laboratory testing.    CIPROFLOXACIN  Value in next row Resistant      >=32 RESISTANTThis is a modified FDA-approved test that has been validated  and its performance characteristics determined by the reporting laboratory.  This laboratory is certified under the Clinical Laboratory Improvement Amendments CLIA as qualified to perform high complexity clinical laboratory testing.  GENTAMICIN  Value in next row Sensitive      >=32 RESISTANTThis is a modified FDA-approved test that has been validated and its performance characteristics determined by the reporting laboratory.  This laboratory is certified under the Clinical Laboratory Improvement Amendments CLIA as qualified to perform high complexity clinical laboratory testing.    NITROFURANTOIN  Value in next row Resistant      >=32 RESISTANTThis is a modified FDA-approved test that has been validated and its performance characteristics determined by the reporting laboratory.  This laboratory is certified under the Clinical Laboratory Improvement Amendments CLIA as qualified to perform high complexity clinical laboratory testing.    TRIMETH /SULFA  Value in next row Resistant      >=32 RESISTANTThis is a modified FDA-approved test that has been validated and its performance characteristics determined by the reporting laboratory.  This laboratory is certified under the Clinical Laboratory Improvement Amendments CLIA as qualified to perform high complexity clinical laboratory testing.    AMPICILLIN/SULBACTAM Value in next row Resistant      >=32 RESISTANTThis is a modified FDA-approved test that has been validated and its performance characteristics determined by the reporting laboratory.  This laboratory is certified under the Clinical Laboratory Improvement Amendments CLIA as qualified to perform high complexity clinical laboratory testing.    PIP/TAZO Value in next row Resistant ug/mL     >=128 RESISTANTThis is a modified FDA-approved test that has been validated and its performance characteristics determined by the reporting laboratory.  This laboratory is certified under the Clinical Laboratory  Improvement Amendments CLIA as qualified to perform high complexity clinical laboratory testing.    MEROPENEM  Value in next row Sensitive      >=128 RESISTANTThis is a modified FDA-approved test that has been validated and its performance characteristics determined by the reporting laboratory.  This laboratory is certified under the Clinical Laboratory Improvement Amendments CLIA as qualified to perform high complexity clinical laboratory testing.    * >=100,000 COLONIES/mL KLEBSIELLA PNEUMONIAE     Labs: BNP (last 3 results) No results for input(s): BNP in the last 8760 hours. Basic Metabolic Panel: Recent Labs  Lab 12/05/23 1524 12/06/23 0626 12/07/23 0856  NA 142 143 140  K 5.3* 3.9 3.7  CL 107 107 103  CO2 26 27 27   GLUCOSE 107* 96 118*  BUN 15 14 16   CREATININE 0.72 0.68 0.72  CALCIUM  9.8 9.0 9.1   Liver Function Tests: Recent Labs  Lab 12/05/23 1524  AST 20  ALT 18  ALKPHOS 47  BILITOT 0.6  PROT 7.4  ALBUMIN 4.4   No results for input(s): LIPASE, AMYLASE in the last 168 hours. No results for input(s): AMMONIA in the last 168 hours. CBC: Recent Labs  Lab 12/05/23 1524 12/06/23 0626 12/07/23 0856  WBC 6.3 3.6* 4.8  NEUTROABS 4.6  --  3.1  HGB 14.0 11.9* 13.7  HCT 41.3 35.2* 41.3  MCV 91.8 93.1 92.2  PLT 216 176 215   Cardiac Enzymes: No results for input(s): CKTOTAL, CKMB, CKMBINDEX, TROPONINI in the last 168 hours. BNP: Invalid input(s): POCBNP CBG: No results for input(s): GLUCAP in the last 168 hours. D-Dimer No results for input(s): DDIMER in the last 72 hours. Hgb A1c No results for input(s): HGBA1C in the last 72 hours. Lipid Profile No results for input(s): CHOL, HDL, LDLCALC, TRIG, CHOLHDL, LDLDIRECT in the last 72 hours. Thyroid  function studies No results for input(s): TSH, T4TOTAL, T3FREE, THYROIDAB in the last 72 hours.  Invalid input(s): FREET3 Anemia work up No results  for input(s):  VITAMINB12, FOLATE, FERRITIN, TIBC, IRON, RETICCTPCT in the last 72 hours. Urinalysis    Component Value Date/Time   COLORURINE YELLOW (A) 12/05/2023 1801   APPEARANCEUR HAZY (A) 12/05/2023 1801   APPEARANCEUR Clear 11/28/2023 0910   LABSPEC 1.008 12/05/2023 1801   PHURINE 7.0 12/05/2023 1801   GLUCOSEU NEGATIVE 12/05/2023 1801   GLUCOSEU NEGATIVE 10/04/2023 1115   HGBUR NEGATIVE 12/05/2023 1801   BILIRUBINUR NEGATIVE 12/05/2023 1801   BILIRUBINUR Negative 11/28/2023 0910   KETONESUR NEGATIVE 12/05/2023 1801   PROTEINUR NEGATIVE 12/05/2023 1801   UROBILINOGEN 0.2 10/04/2023 1117   UROBILINOGEN 0.2 10/04/2023 1115   NITRITE POSITIVE (A) 12/05/2023 1801   LEUKOCYTESUR NEGATIVE 12/05/2023 1801   Sepsis Labs Recent Labs  Lab 12/05/23 1524 12/06/23 0626 12/07/23 0856  WBC 6.3 3.6* 4.8   Microbiology Recent Results (from the past 240 hours)  CULTURE, URINE COMPREHENSIVE     Status: Abnormal   Collection Time: 11/28/23  9:10 AM   Specimen: Urine   UR  Result Value Ref Range Status   Urine Culture, Comprehensive Final report (A)  Final   Organism ID, Bacteria Klebsiella pneumoniae (A)  Final    Comment: Susceptibility profile is consistent with a probable ESBL. Multi-Drug Resistant Organism Greater than 100,000 colony forming units per mL    ANTIMICROBIAL SUSCEPTIBILITY Comment  Final    Comment:       ** S = Susceptible; I = Intermediate; R = Resistant **                    P = Positive; N = Negative             MICS are expressed in micrograms per mL    Antibiotic                 RSLT#1    RSLT#2    RSLT#3    RSLT#4 Amoxicillin/Clavulanic Acid    I Ampicillin                     R Cefazolin                      R Cefepime                       R Cefoxitin                      S Cefpodoxime                    R Ceftriaxone                    R Ciprofloxacin                   R Ertapenem                      S Gentamicin                      S Levofloxacin                     R Meropenem                       S Nitrofurantoin                  R Piperacillin/Tazobactam  R Tetracycline                   R Tobramycin                     I Trimethoprim /Sulfa              R   Microscopic Examination     Status: Abnormal   Collection Time: 11/28/23  9:10 AM   Urine  Result Value Ref Range Status   WBC, UA 0-5 0 - 5 /hpf Final   RBC, Urine 3-10 (A) 0 - 2 /hpf Final   Epithelial Cells (non renal) 0-10 0 - 10 /hpf Final   Bacteria, UA Many (A) None seen/Few Final  Urine Culture     Status: Abnormal   Collection Time: 12/03/23  6:51 PM   Specimen: Urine, Random  Result Value Ref Range Status   Specimen Description   Final    URINE, RANDOM Performed at [facility], [address] [place], [place] [address]    Special Requests   Final    NONE Performed at [facility], [address] [place], [place] [address]    Culture (A)  Final    >=100,000 COLONIES/mL KLEBSIELLA PNEUMONIAE Confirmed Extended Spectrum Beta-Lactamase Producer (ESBL).  In bloodstream infections from ESBL organisms, carbapenems are preferred over piperacillin/tazobactam. They are shown to have a lower risk of mortality.    Report Status 12/06/2023 FINAL  Final   Organism ID, Bacteria KLEBSIELLA PNEUMONIAE (A)  Final      Susceptibility   Klebsiella pneumoniae - MIC*    AMPICILLIN >=32 RESISTANT Resistant     CEFAZOLIN (URINE) Value in next row Resistant      >=32 RESISTANTThis is a modified FDA-approved test that has been validated and its performance characteristics determined by the reporting laboratory.  This laboratory is certified under the Clinical Laboratory Improvement Amendments CLIA as qualified to perform high complexity clinical laboratory testing.    CEFEPIME Value in next row Resistant      >=32 RESISTANTThis is a modified FDA-approved test that has been validated and its performance characteristics determined by the reporting  laboratory.  This laboratory is certified under the Clinical Laboratory Improvement Amendments CLIA as qualified to perform high complexity clinical laboratory testing.    CEFTRIAXONE Value in next row Resistant      >=32 RESISTANTThis is a modified FDA-approved test that has been validated and its performance characteristics determined by the reporting laboratory.  This laboratory is certified under the Clinical Laboratory Improvement Amendments CLIA as qualified to perform high complexity clinical laboratory testing.    CIPROFLOXACIN  Value in next row Resistant      >=32 RESISTANTThis is a modified FDA-approved test that has been validated and its performance characteristics determined by the reporting laboratory.  This laboratory is certified under the Clinical Laboratory Improvement Amendments CLIA as qualified to perform high complexity clinical laboratory testing.    GENTAMICIN  Value in next row Sensitive      >=32 RESISTANTThis is a modified FDA-approved test that has been validated and its performance characteristics determined by the reporting laboratory.  This laboratory is certified under the Clinical Laboratory Improvement Amendments CLIA as qualified to perform high complexity clinical laboratory testing.    NITROFURANTOIN  Value in next row Resistant      >=32 RESISTANTThis is a modified FDA-approved test that has been validated and its performance characteristics determined by the reporting laboratory.  This laboratory is certified under  the Clinical Laboratory Improvement Amendments CLIA as qualified to perform high complexity clinical laboratory testing.    TRIMETH /SULFA  Value in next row Resistant      >=32 RESISTANTThis is a modified FDA-approved test that has been validated and its performance characteristics determined by the reporting laboratory.  This laboratory is certified under the Clinical Laboratory Improvement Amendments CLIA as qualified to perform high complexity clinical  laboratory testing.    AMPICILLIN/SULBACTAM Value in next row Resistant      >=32 RESISTANTThis is a modified FDA-approved test that has been validated and its performance characteristics determined by the reporting laboratory.  This laboratory is certified under the Clinical Laboratory Improvement Amendments CLIA as qualified to perform high complexity clinical laboratory testing.    PIP/TAZO Value in next row Resistant ug/mL     >=128 RESISTANTThis is a modified FDA-approved test that has been validated and its performance characteristics determined by the reporting laboratory.  This laboratory is certified under the Clinical Laboratory Improvement Amendments CLIA as qualified to perform high complexity clinical laboratory testing.    MEROPENEM  Value in next row Sensitive      >=128 RESISTANTThis is a modified FDA-approved test that has been validated and its performance characteristics determined by the reporting laboratory.  This laboratory is certified under the Clinical Laboratory Improvement Amendments CLIA as qualified to perform high complexity clinical laboratory testing.    * >=100,000 COLONIES/mL KLEBSIELLA PNEUMONIAE  Urine Culture (for pregnant, neutropenic or urologic patients or patients with an indwelling urinary catheter)     Status: Abnormal   Collection Time: 12/05/23  6:01 PM   Specimen: Urine, Clean Catch  Result Value Ref Range Status   Specimen Description   Final    URINE, CLEAN CATCH Performed at [facility], [address] [place], [place] [address]    Special Requests   Final    NONE Performed at [facility], [address] [place], [place] [address]    Culture (A)  Final    >=100,000 COLONIES/mL KLEBSIELLA PNEUMONIAE Confirmed Extended Spectrum Beta-Lactamase Producer (ESBL).  In bloodstream infections from ESBL organisms, carbapenems are preferred over piperacillin/tazobactam. They are shown to have a lower risk of mortality.    Report  Status 12/07/2023 FINAL  Final   Organism ID, Bacteria KLEBSIELLA PNEUMONIAE (A)  Final      Susceptibility   Klebsiella pneumoniae - MIC*    AMPICILLIN >=32 RESISTANT Resistant     CEFAZOLIN (URINE) Value in next row Resistant      >=32 RESISTANTThis is a modified FDA-approved test that has been validated and its performance characteristics determined by the reporting laboratory.  This laboratory is certified under the Clinical Laboratory Improvement Amendments CLIA as qualified to perform high complexity clinical laboratory testing.    CEFEPIME Value in next row Resistant      >=32 RESISTANTThis is a modified FDA-approved test that has been validated and its performance characteristics determined by the reporting laboratory.  This laboratory is certified under the Clinical Laboratory Improvement Amendments CLIA as qualified to perform high complexity clinical laboratory testing.    CEFTRIAXONE Value in next row Resistant      >=32 RESISTANTThis is a modified FDA-approved test that has been validated and its performance characteristics determined by the reporting laboratory.  This laboratory is certified under the Clinical Laboratory Improvement Amendments CLIA as qualified to perform high complexity clinical laboratory testing.    CIPROFLOXACIN  Value in next row Resistant      >=32 RESISTANTThis is  a modified FDA-approved test that has been validated and its performance characteristics determined by the reporting laboratory.  This laboratory is certified under the Clinical Laboratory Improvement Amendments CLIA as qualified to perform high complexity clinical laboratory testing.    GENTAMICIN  Value in next row Sensitive      >=32 RESISTANTThis is a modified FDA-approved test that has been validated and its performance characteristics determined by the reporting laboratory.  This laboratory is certified under the Clinical Laboratory Improvement Amendments CLIA as qualified to perform high complexity  clinical laboratory testing.    NITROFURANTOIN  Value in next row Resistant      >=32 RESISTANTThis is a modified FDA-approved test that has been validated and its performance characteristics determined by the reporting laboratory.  This laboratory is certified under the Clinical Laboratory Improvement Amendments CLIA as qualified to perform high complexity clinical laboratory testing.    TRIMETH /SULFA  Value in next row Resistant      >=32 RESISTANTThis is a modified FDA-approved test that has been validated and its performance characteristics determined by the reporting laboratory.  This laboratory is certified under the Clinical Laboratory Improvement Amendments CLIA as qualified to perform high complexity clinical laboratory testing.    AMPICILLIN/SULBACTAM Value in next row Resistant      >=32 RESISTANTThis is a modified FDA-approved test that has been validated and its performance characteristics determined by the reporting laboratory.  This laboratory is certified under the Clinical Laboratory Improvement Amendments CLIA as qualified to perform high complexity clinical laboratory testing.    PIP/TAZO Value in next row Resistant ug/mL     >=128 RESISTANTThis is a modified FDA-approved test that has been validated and its performance characteristics determined by the reporting laboratory.  This laboratory is certified under the Clinical Laboratory Improvement Amendments CLIA as qualified to perform high complexity clinical laboratory testing.    MEROPENEM  Value in next row Sensitive      >=128 RESISTANTThis is a modified FDA-approved test that has been validated and its performance characteristics determined by the reporting laboratory.  This laboratory is certified under the Clinical Laboratory Improvement Amendments CLIA as qualified to perform high complexity clinical laboratory testing.    * >=100,000 COLONIES/mL KLEBSIELLA PNEUMONIAE    FURTHER DISCHARGE INSTRUCTIONS:   Get Medicines reviewed  and adjusted: Please take all your medications with you for your next visit with your Primary MD   Laboratory/radiological data: Please request your Primary MD to go over all hospital tests and procedure/radiological results at the follow up, please ask your Primary MD to get all Hospital records sent to his/her office.   In some cases, they will be blood work, cultures and biopsy results pending at the time of your discharge. Please request that your primary care M.D. goes through all the records of your hospital data and follows up on these results.   Also Note the following: If you experience worsening of your admission symptoms, develop shortness of breath, life threatening emergency, suicidal or homicidal thoughts you must seek medical attention immediately by calling 911 or calling your MD immediately  if symptoms less severe.   You must read complete instructions/literature along with all the possible adverse reactions/side effects for all the Medicines you take and that have been prescribed to you. Take any new Medicines after you have completely understood and accpet all the possible adverse reactions/side effects.    patient was instructed, not to drive, operate heavy machinery, perform activities at heights, swimming or participation in water activities or provide baby-sitting services  while on Pain, Sleep and Anxiety Medications; until their outpatient Physician has advised to do so again. Also recommended to not to take more than prescribed Pain, Sleep and Anxiety Medications.  It is not advisable to combine anxiety, sleep and pain medications without talking with your primary care provider.     Wear Seat belts while driving.   Please note: You were cared for by a hospitalist during your hospital stay. Once you are discharged, your primary care physician will handle any further medical issues. Please note that NO REFILLS for any discharge medications will be authorized once you are  discharged, as it is imperative that you return to your primary care physician (or establish a relationship with a primary care physician if you do not have one) for your post hospital discharge needs so that they can reassess your need for medications and monitor your lab values  Time coordinating discharge: Over 30 minutes  SIGNED:   Fredia Skeeter, MD  [facility] 12/07/2023, 11:37 AM *Please note that this is a verbal dictation therefore any spelling or grammatical errors are due to the Dragon Medical One system interpretation. If 7PM-7AM, please contact night-coverage [email or web address]

## 2023-12-08 ENCOUNTER — Emergency Department
Admission: EM | Admit: 2023-12-08 | Discharge: 2023-12-08 | Disposition: A | Discharge status: Home / Self Care | Attending: Emergency Medicine | Admitting: Emergency Medicine

## 2023-12-08 ENCOUNTER — Emergency Department

## 2023-12-08 DIAGNOSIS — R110 Nausea: Secondary | ICD-10-CM

## 2023-12-08 DIAGNOSIS — R1033 Periumbilical pain: Secondary | ICD-10-CM

## 2023-12-08 DIAGNOSIS — K5900 Constipation, unspecified: Secondary | ICD-10-CM

## 2023-12-08 LAB — URINALYSIS, ROUTINE W REFLEX MICROSCOPIC
Bilirubin Urine: NEGATIVE
Glucose, UA: NEGATIVE mg/dL
Hgb urine dipstick: NEGATIVE
Ketones, ur: NEGATIVE mg/dL
Leukocytes,Ua: NEGATIVE
Nitrite: NEGATIVE
Protein, ur: NEGATIVE mg/dL
Specific Gravity, Urine: 1.012 (ref 1.005–1.030)
pH: 6 (ref 5.0–8.0)

## 2023-12-08 LAB — COMPREHENSIVE METABOLIC PANEL WITH GFR
ALT: 17 U/L (ref 0–44)
AST: 19 U/L (ref 15–41)
Albumin: 4.2 g/dL (ref 3.5–5.0)
Alkaline Phosphatase: 51 U/L (ref 38–126)
Anion gap: 10 (ref 5–15)
BUN: 22 mg/dL — ABNORMAL HIGH (ref 6–20)
CO2: 27 mmol/L (ref 22–32)
Calcium: 9.7 mg/dL (ref 8.9–10.3)
Chloride: 104 mmol/L (ref 98–111)
Creatinine, Ser: 0.72 mg/dL (ref 0.44–1.00)
GFR, Estimated: >60 mL/min (ref >60)
Glucose, Bld: 101 mg/dL — ABNORMAL HIGH (ref 70–99)
Potassium: 4.5 mmol/L (ref 3.5–5.1)
Sodium: 141 mmol/L (ref 135–145)
Total Bilirubin: 0.6 mg/dL (ref 0.0–1.2)
Total Protein: 7.1 g/dL (ref 6.5–8.1)

## 2023-12-08 LAB — LIPASE, BLOOD: Lipase: 65 U/L — ABNORMAL HIGH (ref 11–51)

## 2023-12-08 MED ORDER — ONDANSETRON HCL 4 MG/2ML IJ SOLN
4.0000 mg | Freq: Once | INTRAMUSCULAR | Status: AC
  Administered 2023-12-08: 4 mg via INTRAVENOUS
  Filled 2023-12-08: qty 2

## 2023-12-08 MED ORDER — SODIUM CHLORIDE 0.9 % IV BOLUS
1000.0000 mL | Freq: Once | INTRAVENOUS | Status: AC
  Administered 2023-12-08: 1000 mL via INTRAVENOUS

## 2023-12-08 MED ORDER — IOHEXOL 300 MG/ML  SOLN
100.0000 mL | Freq: Once | INTRAMUSCULAR | Status: AC | PRN
  Administered 2023-12-08: 100 mL via INTRAVENOUS

## 2023-12-08 MED ORDER — POLYETHYLENE GLYCOL 3350 17 G PO PACK: 17.0000 g | PACK | Freq: Two times a day (BID) | ORAL | 0 refills | Status: AC

## 2023-12-08 MED ORDER — PROBIOTIC DAILY PO CAPS: 1.0000 | ORAL_CAPSULE | Freq: Every day | ORAL | 0 refills | Status: AC

## 2023-12-08 NOTE — Discharge Instructions (Signed)
 Fortunately your evaluation in the emergency department did not show any evidence of ongoing urine infection nor did show any emergency conditions inside of your abdomen scan to require hospitalization or surgeries at this time.  Take MiraLAX  twice daily as prescribed to help with constipation should this be contributing to your discomfort.  You can also take a probiotic to help reestablish the balance of gut bacteria should this be a result of your recent antibiotic use causing abdominal discomfort.  Thank you for choosing us  for your health care today!  Please see your primary doctor this week for a follow up appointment.   If you have any new, worsening, or unexpected symptoms call your doctor right away or come back to the emergency department for reevaluation.  It was my pleasure to care for you today.   Ginnie EDISON Cyrena, MD

## 2023-12-08 NOTE — ED Provider Notes (Addendum)
 [facility] Provider Note    Event Date/Time   First MD Initiated Contact with Patient 12/08/23 [phone]     (approximate)   History   Abdominal Pain   HPI  Cristina Wade is a 53 y.o. female   Past medical history of transverse myelitis and neurogenic bladder with intermittent self-catheterization, asthma, GERD, depression anxiety, cystocele, pelvic pain, endometriosis, and multidrug-resistant urinary tract infection recently treated with IV antibiotics and discharged just yesterday who presents to Emergency Department with ongoing periumbilical discomfort, nausea, bloating sensation and constipation.  She has been passing gas.  She has no surgical history.  She has not had a bowel movement today and feels constipated.  She has had ongoing discomfort around the periumbilical area that she had felt during her hospitalization.  It is slightly worsened today.  She has had nausea but no vomiting.  Independent Historian contributed to assessment above: Sister corroborates information given above  External Medical Documents Reviewed: Recent hospitalization notes.      Physical Exam   Triage Vital Signs: ED Triage Vitals [12/07/23 2340]  Encounter Vitals Group     BP (!) 140/79     Girls Systolic BP Percentile      Girls Diastolic BP Percentile      Boys Systolic BP Percentile      Boys Diastolic BP Percentile      Pulse Rate 64     Resp 18     Temp 98 F (36.7 C)     Temp Source Oral     SpO2 100 %     Weight 128 lb (58.1 kg)     Height 5' 10 (1.778 m)     Head Circumference      Peak Flow      Pain Score 9     Pain Loc      Pain Education      Exclude from Growth Chart     Most recent vital signs: Vitals:   12/07/23 2340  BP: (!) 140/79  Pulse: 64  Resp: 18  Temp: 98 F (36.7 C)  SpO2: 100%    General: Awake, no distress.  CV:  Good peripheral perfusion.  Resp:  Normal effort.  Abd:  No distention.  Other:  Awake alert comfortable  with slight hypertension otherwise vital signs normal.  Soft nonperitoneal abdomen with periumbilical tenderness to palpation without rigidity guarding.  Skin appears warm well-perfused.  No fever.   ED Results / Procedures / Treatments   Labs (all labs ordered are listed, but only abnormal results are displayed) Labs Reviewed  LIPASE, BLOOD - Abnormal; Notable for the following components:      Result Value   Lipase 65 (*)    All other components within normal limits  COMPREHENSIVE METABOLIC PANEL WITH GFR - Abnormal; Notable for the following components:   Glucose, Bld 101 (*)    BUN 22 (*)    All other components within normal limits  URINALYSIS, ROUTINE W REFLEX MICROSCOPIC - Abnormal; Notable for the following components:   Color, Urine YELLOW (*)    APPearance CLEAR (*)    All other components within normal limits  CBC     I ordered and reviewed the above labs they are notable for cell counts electrolytes unremarkable.     RADIOLOGY I independently reviewed and interpreted CT scan of the abdomen and see no obvious obstructive or inflammatory changes I also reviewed radiologist's formal read.   PROCEDURES:  Critical Care performed:  No  Procedures   MEDICATIONS ORDERED IN ED: Medications  sodium chloride  0.9 % bolus 1,000 mL (1,000 mLs Intravenous New Bag/Given 12/08/23 0226)  ondansetron  (ZOFRAN ) injection 4 mg (4 mg Intravenous Given 12/08/23 0241)  iohexol  (OMNIPAQUE ) 300 MG/ML solution 100 mL (100 mLs Intravenous Contrast Given 12/08/23 0303)     IMPRESSION / MDM / ASSESSMENT AND PLAN / ED COURSE  I reviewed the triage vital signs and the nursing notes.                                Patient's presentation is most consistent with acute presentation with potential threat to life or bodily function.  Differential diagnosis includes, but is not limited to, urinary tract infection, intra-abdominal infection, kidney stone, obstruction, adverse effect of recent  IV antibiotic treatment, constipation   The patient is on the cardiac monitor to evaluate for evidence of arrhythmia and/or significant heart rate changes.  MDM:    53 year old woman with a recent complicated urinary tract infection multidrug-resistant treated with a full course of IV antibiotics discharged just yesterday with ongoing abdominal discomfort, constipation, bloating pain, periumbilical pain.  Tenderness to palpation.  Fortunately labs appear normal, will obtain CT scan to rule out surgical abdominal pathologies.  Will recheck urinalysis today to see if there is ongoing infection.    Could be her constipation or affect of recent antibiotic use as well.  In the interim we will treat with IV fluids and Zofran .   CT scan and urinalysis unremarkable and patient stable.  Discharged with probiotic and MiraLAX .  Follow-up with PMD, return with worsening.      FINAL CLINICAL IMPRESSION(S) / ED DIAGNOSES   Final diagnoses:  Periumbilical pain  Nausea  Constipation, unspecified constipation type     Rx / DC Orders   ED Discharge Orders          Ordered    polyethylene glycol (MIRALAX  / GLYCOLAX ) 17 g packet  2 times daily        12/08/23 0403    Probiotic Product (PROBIOTIC DAILY) CAPS  Daily        12/08/23 0403             Note:  This document was prepared using Dragon voice recognition software and may include unintentional dictation errors.    [name], MD 12/08/23 9595    [name], MD 12/08/23 [phone]

## 2023-12-10 ENCOUNTER — Encounter: Payer: Self-pay | Admitting: Emergency Medicine

## 2023-12-10 ENCOUNTER — Emergency Department

## 2023-12-10 ENCOUNTER — Encounter: Payer: Self-pay | Admitting: Podiatry

## 2023-12-10 ENCOUNTER — Emergency Department: Admission: EM | Admit: 2023-12-10 | Discharge: 2023-12-10 | Disposition: A | Discharge status: Home / Self Care

## 2023-12-10 ENCOUNTER — Telehealth: Payer: Self-pay

## 2023-12-10 ENCOUNTER — Other Ambulatory Visit: Payer: Self-pay

## 2023-12-10 DIAGNOSIS — J45909 Unspecified asthma, uncomplicated: Secondary | ICD-10-CM | POA: Diagnosis not present

## 2023-12-10 DIAGNOSIS — R079 Chest pain, unspecified: Secondary | ICD-10-CM | POA: Diagnosis present

## 2023-12-10 DIAGNOSIS — R0602 Shortness of breath: Secondary | ICD-10-CM | POA: Diagnosis not present

## 2023-12-10 LAB — CBC
HCT: 39.3 % (ref 36.0–46.0)
Hemoglobin: 13 g/dL (ref 12.0–15.0)
MCH: 30.6 pg (ref 26.0–34.0)
MCHC: 33.1 g/dL (ref 30.0–36.0)
MCV: 92.5 fL (ref 80.0–100.0)
Platelets: 212 K/uL (ref 150–400)
RBC: 4.25 MIL/uL (ref 3.87–5.11)
RDW: 12.9 % (ref 11.5–15.5)
WBC: 5.4 K/uL (ref 4.0–10.5)
nRBC: 0 % (ref 0.0–0.2)

## 2023-12-10 LAB — BASIC METABOLIC PANEL WITH GFR
Anion gap: 10 (ref 5–15)
BUN: 18 mg/dL (ref 6–20)
CO2: 27 mmol/L (ref 22–32)
Calcium: 9.5 mg/dL (ref 8.9–10.3)
Chloride: 103 mmol/L (ref 98–111)
Creatinine, Ser: 0.64 mg/dL (ref 0.44–1.00)
GFR, Estimated: >60 mL/min (ref >60)
Glucose, Bld: 98 mg/dL (ref 70–99)
Potassium: 3.7 mmol/L (ref 3.5–5.1)
Sodium: 140 mmol/L (ref 135–145)

## 2023-12-10 LAB — TROPONIN I (HIGH SENSITIVITY)
Troponin I (High Sensitivity): 3 ng/L (ref <18)
Troponin I (High Sensitivity): <2 ng/L (ref <18)

## 2023-12-10 LAB — D-DIMER, QUANTITATIVE: D-Dimer, Quant: 0.41 ug{FEU}/mL (ref 0.00–0.50)

## 2023-12-10 MED ORDER — SUCRALFATE 1 G PO TABS: 1.0000 g | ORAL_TABLET | Freq: Every evening | ORAL | 1 refills | Status: DC | PRN

## 2023-12-10 NOTE — ED Notes (Signed)
 Lauren, PA at bedside. Pt placed on monitor at this time.

## 2023-12-10 NOTE — ED Provider Notes (Signed)
 [facility] Provider Note    Event Date/Time   First MD Initiated Contact with Patient 12/10/23 RONOLD     (approximate)   History   Chest Pain   HPI  Cristina Wade is a 53 y.o. female with PMH of GERD, endometriosis, migraines, asthma and recent foot surgery presents for evaluation of chest pain.  She reports that the chest pain and shortness of breath began a couple days ago.  She finds it is worse at night when she is laying down.  Does not describe it getting worse with exertion.  Does not have pain when taking a deep breath.  Pain does not radiate anywhere.      Physical Exam   Triage Vital Signs: ED Triage Vitals  Encounter Vitals Group     BP 12/10/23 1641 112/69     Girls Systolic BP Percentile --      Girls Diastolic BP Percentile --      Boys Systolic BP Percentile --      Boys Diastolic BP Percentile --      Pulse Rate 12/10/23 1641 78     Resp 12/10/23 1641 16     Temp 12/10/23 1641 97.8 F (36.6 C)     Temp Source 12/10/23 1641 Oral     SpO2 12/10/23 1641 98 %     Weight --      Height --      Head Circumference --      Peak Flow --      Pain Score 12/10/23 1539 0     Pain Loc --      Pain Education --      Exclude from Growth Chart --     Most recent vital signs: Vitals:   12/10/23 1854 12/10/23 1930  BP: 127/81 119/77  Pulse: 65 64  Resp: 18 19  Temp:    SpO2: 98% 98%   General: Awake, no distress.  CV:  Good peripheral perfusion.  RRR. Resp:  Normal effort.  CTAB. Abd:  No distention.  Other:  No swelling or skin discoloration noted to the left lower leg above her cast, no tenderness to palpation of the calf.   ED Results / Procedures / Treatments   Labs (all labs ordered are listed, but only abnormal results are displayed) Labs Reviewed  BASIC METABOLIC PANEL WITH GFR  CBC  D-DIMER, QUANTITATIVE  TROPONIN I (HIGH SENSITIVITY)  TROPONIN I (HIGH SENSITIVITY)     EKG  ED provider interpretation: Normal  sinus rhythm.  Vent. rate 67 BPM PR interval 128 ms QRS duration 92 ms QT/QTcB 414/437 ms P-R-T axes 79 100 79  RADIOLOGY  Chest x-ray obtained, I interpreted the images as well as reviewed the radiologist report, which was negative for any acute cardiopulmonary abnormalities.  PROCEDURES:  Critical Care performed: No  Procedures   MEDICATIONS ORDERED IN ED: Medications - No data to display   IMPRESSION / MDM / ASSESSMENT AND PLAN / ED COURSE  I reviewed the triage vital signs and the nursing notes.                             53 year old female presents for evaluation of chest pain and shortness of breath for 2 days.  Vital signs are stable patient NAD on exam.  Differential diagnosis includes, but is not limited to, ACS, aortic dissection, pulmonary embolism, cardiac tamponade, pneumothorax, pneumonia, pericarditis, GI-related causes including esophagitis/gastritis, and  musculoskeletal chest wall pain.    Patient's presentation is most consistent with acute complicated illness / injury requiring diagnostic workup.  CBC and BMP unremarkable.  Troponin is negative x 2.  D-dimer is negative.  Chest x-ray is negative.  EKG shows normal sinus rhythm.  Low suspicion for ACS given negative troponin and normal sinus rhythm without ST changes on EKG.  Pain does not radiate to patient's back and blood pressure is normal decreasing my suspicion for aortic dissection as a cause.  Since patient is 67 cannot use PERC rule to rule out pulmonary embolism.  Based on Wells criteria she is low risk so I obtained a D-dimer which was negative, allowing me to rule out PE.  Chest x-ray was normal and did not show evidence of pneumonia or pneumothorax.  No recent URI decreasing my suspicion for pericarditis.  Workup is very reassuring and I have a low suspicion for an emergent cause of chest pain.  Suspect that her chest pain is related to GERD or costochondritis.  Patient has a history of reflux and  her pain is worse when lying down.  She was given reassurance.  I advised her to continue taking her PPI medication as prescribed.  Will send a prescription for an antacid for her to try taking at bedtime.  Advised patient to follow-up with her cardiologist and primary care provider as needed.  She voiced understanding, all questions were answered and she was stable at discharge.      FINAL CLINICAL IMPRESSION(S) / ED DIAGNOSES   Final diagnoses:  Chest pain, unspecified type     Rx / DC Orders   ED Discharge Orders          Ordered    sucralfate  (CARAFATE ) 1 g tablet  At bedtime PRN        12/10/23 2016             Note:  This document was prepared using Dragon voice recognition software and may include unintentional dictation errors.   Cleaster Tinnie LABOR, PA-C 12/10/23 2019    [name], MD 12/12/23 [phone]

## 2023-12-10 NOTE — Transitions of Care (Post Inpatient/ED Visit) (Signed)
   12/10/2023  Name: Cristina Wade MRN: [id number] DOB: Dec 19, 1970  Today's TOC FU Call Status: Today's TOC FU Call Status:: Successful TOC FU Call Completed TOC FU Call Complete Date: 12/10/23 Patient's Name and Date of Birth confirmed.  Transition Care Management Follow-up Telephone Call Date of Discharge: 12/07/23 Discharge Facility: [facility]) Type of Discharge: Inpatient Admission How have you been since you were released from the hospital?: Better Any questions or concerns?: No  Items Reviewed: Did you receive and understand the discharge instructions provided?: Yes Medications obtained,verified, and reconciled?: Yes (Medications Reviewed) Any new allergies since your discharge?: No Dietary orders reviewed?: Yes Do you have support at home?: Yes People in Home [RPT]: sibling(s)  Medications Reviewed Today: Medications Reviewed Today     Reviewed by Emmitt Pan, LPN (Licensed Practical Nurse) on 12/10/23 at 1024  Med List Status: <None>   Medication Order Taking? Sig Documenting Provider Last Dose Status Informant  aspirin  EC 81 MG tablet [id number] Yes Take 81 mg by mouth daily. Swallow whole. [provider]  Active Self  Calcium  Carbonate-Vit D-Min (CALCIUM  1200 PO) [id number] Yes Take 2-3 tablets by mouth 2 (two) times daily. Reported on 05/26/2015 [provider]  Active Self  cholecalciferol  (VITAMIN D3) 10 MCG (400 UNIT) TABS tablet [id number] Yes Take 400 Units by mouth daily. [provider]  Active Self  Famotidine  (PEPCID  PO) [id number] Yes Take by mouth. [provider]  Active Self  Multiple Vitamin (MULTIVITAMIN ADULT PO) [id number] Yes Take 1 tablet by mouth daily. [provider]  Active Self  pantoprazole  (PROTONIX ) 40 MG tablet [id number] Yes Take 1 tablet (40 mg total) by mouth 2 (two) times daily before a meal. Danford, Lonni SQUIBB, MD  Active Self  polyethylene glycol (MIRALAX  / GLYCOLAX ) 17 g packet [id number]  Yes Take 17 g by mouth 2 (two) times daily. [name], MD  Active   Probiotic Product (PROBIOTIC DAILY) CAPS [id number] Yes Take 1 capsule by mouth daily. [name], MD  Active   psyllium (REGULOID) 0.52 g capsule [id number] Yes Take 0.52 g by mouth daily. [provider]  Active Self  ZINC  OXIDE PO [id number] Yes Take 1 tablet by mouth daily. [provider]  Active Self            Home Care and Equipment/Supplies: Were Home Health Services Ordered?: NA Any new equipment or medical supplies ordered?: NA  Functional Questionnaire: Do you need assistance with bathing/showering or dressing?: No Do you need assistance with meal preparation?: No Do you need assistance with eating?: No Do you have difficulty maintaining continence: No Do you need assistance with getting out of bed/getting out of a chair/moving?: No Do you have difficulty managing or taking your medications?: No  Follow up appointments reviewed: PCP Follow-up appointment confirmed?: No (no avail appt time. Patient needs afternoon appt) Specialist Hospital Follow-up appointment confirmed?: Yes Date of Specialist follow-up appointment?: 12/13/23 Follow-Up Specialty Provider:: ID Do you need transportation to your follow-up appointment?: No Do you understand care options if your condition(s) worsen?: Yes-patient verbalized understanding    SIGNATURE Pan Emmitt, LPN [facility] Nurse Health Advisor Direct Dial [phone]

## 2023-12-10 NOTE — ED Triage Notes (Signed)
 Pt reports chest pain and SHOB. PT reports having surgery on her ankle August 1st. Denies back pain.

## 2023-12-10 NOTE — Discharge Instructions (Signed)
 Your blood work, chest x-ray and EKG were all normal today.  I believe your chest pain is coming from acid reflux.  I sent a prescription for an antacid.  Try taking this before bed to see if it improves your symptoms.  Schedule follow-up appointment with your cardiologist and primary care provider as needed.  You can return to the emergency department with any worsening symptoms.

## 2023-12-10 NOTE — Telephone Encounter (Signed)
 Call patient I am of course happy to see her but would recommend urology follow-up for complicated UTI.  Please offer to schedule her an appointment with urologist if helpful, [facility],    Most important is that she keeps appointment with infectious disease on the 28th of this month.

## 2023-12-10 NOTE — Telephone Encounter (Signed)
 LVM and sent pt a my chart message as well to call back to office

## 2023-12-11 NOTE — Telephone Encounter (Signed)
 Spoke to pt she is aware she has spoken to urology and will see infectious disease on Thursday the 28th

## 2023-12-11 NOTE — Telephone Encounter (Signed)
 Spoke with patient advised

## 2023-12-11 NOTE — Telephone Encounter (Signed)
 Noted! See pt calls

## 2023-12-13 ENCOUNTER — Ambulatory Visit: Attending: Infectious Diseases | Admitting: Infectious Diseases

## 2023-12-13 ENCOUNTER — Encounter: Payer: Self-pay | Admitting: Infectious Diseases

## 2023-12-13 ENCOUNTER — Other Ambulatory Visit
Admission: RE | Admit: 2023-12-13 | Discharge: 2023-12-13 | Disposition: A | Discharge status: Home / Self Care | Source: Ambulatory Visit | Attending: Infectious Diseases | Admitting: Infectious Diseases

## 2023-12-13 VITALS — BP 132/75 | HR 60 | Temp 97.9°F

## 2023-12-13 DIAGNOSIS — R197 Diarrhea, unspecified: Secondary | ICD-10-CM | POA: Diagnosis not present

## 2023-12-13 DIAGNOSIS — N390 Urinary tract infection, site not specified: Secondary | ICD-10-CM | POA: Diagnosis present

## 2023-12-13 DIAGNOSIS — N319 Neuromuscular dysfunction of bladder, unspecified: Secondary | ICD-10-CM | POA: Insufficient documentation

## 2023-12-13 DIAGNOSIS — R339 Retention of urine, unspecified: Secondary | ICD-10-CM | POA: Diagnosis not present

## 2023-12-13 DIAGNOSIS — K5900 Constipation, unspecified: Secondary | ICD-10-CM | POA: Insufficient documentation

## 2023-12-13 DIAGNOSIS — Z1624 Resistance to multiple antibiotics: Secondary | ICD-10-CM | POA: Insufficient documentation

## 2023-12-13 DIAGNOSIS — Z87440 Personal history of urinary (tract) infections: Secondary | ICD-10-CM | POA: Diagnosis not present

## 2023-12-13 DIAGNOSIS — G373 Acute transverse myelitis in demyelinating disease of central nervous system: Secondary | ICD-10-CM

## 2023-12-13 LAB — URINALYSIS, W/ REFLEX TO CULTURE (INFECTION SUSPECTED)
Bilirubin Urine: NEGATIVE
Glucose, UA: NEGATIVE mg/dL
Hgb urine dipstick: NEGATIVE
Ketones, ur: NEGATIVE mg/dL
Nitrite: NEGATIVE
Protein, ur: NEGATIVE mg/dL
Specific Gravity, Urine: 1.006 (ref 1.005–1.030)
Squamous Epithelial / HPF: 0 /HPF (ref 0–5)
pH: 7 (ref 5.0–8.0)

## 2023-12-13 NOTE — Progress Notes (Signed)
 NAME: Cristina Wade  DOB: 1970/09/04  MRN: [id number]  Date/Time: 12/13/2023 8:47 AM  REQUESTING PROVIDER Subjective:  REASON FOR CONSULT:  ?pt agreed to the use of AI scribe  Cristina Wade is a 53 year old female with h/o tranverse myelitis, neurogenic bladder, self catheterization , recurrent urinary tract infections with MDRO   presents for evaluation. She was referred by her urologist for further evaluation of her recurrent urinary tract infections.  She has recurrent urinary tract infections (UTIs) resistant to oral antibiotics. Recently, she was hospitalized on August 20th for worsening symptoms, including bladder pain, headache, and nausea, and received intravenous ertapenem  for three days and a dose of IV gentamicin  . She reports bladder pain as her primary symptom during infections and denies burning with urination.  She has severe constipation and is taking miralax  and other stool softners  She reports seeing stool on the tissue when wiping after urination intermittently. A CT cystogram in April 2025 showed no evidence of a colovesical fistula.  A cystoscopy done in 2022 did not show any fistula but she has had this problem only after than  Her Gyn Dr.Beasley/NP had prescribed estrace cream for vaginal atrophy but she  has not been using it  She has a history of a split tendon in her leg due to a fall last year, which required surgical intervention on August 1st. Her regular medications include pantoprazole  for acid reflux. No smoking, alcohol, or drug use. She works in a Fut[employer] trader and lives with her sister. She is postmenopausal, with her last menstrual period around age 62 or 39.  Past Medical History:  Diagnosis Date   Anxiety    Asthma    Chronic pelvic pain in female 2018   Cystitis    Cystocele    Endometrial polyp    Endometriosis    per pt report but never seen during surg   Frequent headaches    Gastritis    GERD (gastroesophageal reflux disease)    rare    Gross hematuria    History of kidney stones    Microscopic hematuria    Migraine    Migraines    Neuropathy    Osteopenia    Urinary disorder    UTI (lower urinary tract infection)     Past Surgical History:  Procedure Laterality Date   COLONOSCOPY N/A 11/05/2014   Procedure: COLONOSCOPY;  Surgeon: Lamar ONEIDA Holmes, MD;  Location: [facility] ENDOSCOPY;  Service: Endoscopy;  Laterality: N/A;   COLONOSCOPY  11/2016   Dr. Holmes   CYSTOSCOPY  2007   with biopsy    CYSTOSCOPY N/A 06/01/2016   Procedure: CYSTOSCOPY;  Surgeon: Glory High, MD;  Location: [facility] ORS;  Service: Gynecology;  Laterality: N/A;   DIAGNOSTIC LAPAROSCOPY     DILATATION & CURETTAGE/HYSTEROSCOPY WITH MYOSURE N/A 04/13/2015   Procedure: DILATATION & CURETTAGE/HYSTEROSCOPY WITH MYOSURE/POLYPECTOMY;  Surgeon: Mitzie BROCKS Ward, MD;  Location: [facility] ORS;  Service: Gynecology;  Laterality: N/A;   DILATATION & CURETTAGE/HYSTEROSCOPY WITH MYOSURE N/A 06/01/2016   Procedure: DILATATION & CURETTAGE/HYSTEROSCOPY WITH MYOSURE;  Surgeon: Glory High, MD;  Location: [facility] ORS;  Service: Gynecology;  Laterality: N/A;   DILATION AND CURETTAGE OF UTERUS     ESOPHAGOGASTRODUODENOSCOPY (EGD) WITH PROPOFOL  N/A 08/31/2022   Procedure: ESOPHAGOGASTRODUODENOSCOPY (EGD) WITH PROPOFOL ;  Surgeon: Onita Elspeth Sharper, DO;  Location: [facility] ENDOSCOPY;  Service: Gastroenterology;  Laterality: N/A;   GUM SURGERY     laproscopy  2007   POLYPECTOMY     endometrial  Social History   Socioeconomic History   Marital status: Single    Spouse name: Not on file   Number of children: Not on file   Years of education: Not on file   Highest education level: Not on file  Occupational History   Not on file  Tobacco Use   Smoking status: Never    Passive exposure: Never   Smokeless tobacco: Never  Vaping Use   Vaping status: Never Used  Substance and Sexual Activity   Alcohol use: No   Drug use: No   Sexual activity: Not on file  Other  Topics Concern   Not on file  Social History Narrative   Lives with twin sister   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    Social Drivers of Corporate investment banker Strain: Low Risk  (09/12/2022)   Overall Financial Resource Strain (CARDIA)    Difficulty of Paying Living Expenses: Not very hard  Food Insecurity: No Food Insecurity (12/06/2023)   Hunger Vital Sign    Worried About Running Out of Food in the Last Year: Never true    Ran Out of Food in the Last Year: Never true  Transportation Needs: No Transportation Needs (12/06/2023)   PRAPARE - Administrator, Civil Service (Medical): No    Lack of Transportation (Non-Medical): No  Physical Activity: Not on file  Stress: No Stress Concern Present (09/12/2022)   [employer] of Occupational Health - Occupational Stress Questionnaire    Feeling of Stress : Not at all  Social Connections: Not on file  Intimate Partner Violence: Not At Risk (12/06/2023)   Humiliation, Afraid, Rape, and Kick questionnaire    Fear of Current or Ex-Partner: No    Emotionally Abused: No    Physically Abused: No    Sexually Abused: No    Family History  Problem Relation Age of Onset   Arthritis Mother    Heart disease Mother    Stroke Mother        TIA   Hypertension Mother    Diabetes Mother    Kidney cancer Mother 64   Heart failure Mother    Hyperlipidemia Mother    Colon cancer Father 46   Arthritis Father    Hyperlipidemia Father    Transient ischemic attack Father    Cancer - Colon Father 51   Prostate cancer Father 19   Valvular heart disease Father    Cancer Maternal Grandmother    Cancer Paternal Grandfather        prostate   Breast cancer Cousin 35       has contact   Allergies  Allergen Reactions   Lactose Intolerance (Gi)     Pt consumes milk in  recipes and dairy foods as as ice cream and yogurt, however, states her stomach will hurt if she consumes too much. She was also told milk produces excess mucous    Omeprazole  Other (See Comments)    Pt reports Chest pain, HA and Dysuria. 01/05/22 patient states she is able to take omeprazole  now without a reaction   Penicillins Itching and Rash   I? Current Outpatient Medications  Medication Sig Dispense Refill   aspirin  EC 81 MG tablet Take 81 mg by mouth daily. Swallow whole.  Calcium  Carbonate-Vit D-Min (CALCIUM  1200 PO) Take 2-3 tablets by mouth 2 (two) times daily. Reported on 05/26/2015     cholecalciferol  (VITAMIN D3) 10 MCG (400 UNIT) TABS tablet Take 400 Units by mouth daily.     Famotidine  (PEPCID  PO) Take by mouth.     Multiple Vitamin (MULTIVITAMIN ADULT PO) Take 1 tablet by mouth daily.     pantoprazole  (PROTONIX ) 40 MG tablet Take 1 tablet (40 mg total) by mouth 2 (two) times daily before a meal. 60 tablet 1   polyethylene glycol (MIRALAX  / GLYCOLAX ) 17 g packet Take 17 g by mouth 2 (two) times daily. 60 packet 0   Probiotic Product (PROBIOTIC DAILY) CAPS Take 1 capsule by mouth daily. 60 capsule 0   psyllium (REGULOID) 0.52 g capsule Take 0.52 g by mouth daily.     sucralfate  (CARAFATE ) 1 g tablet Take 1 tablet (1 g total) by mouth at bedtime as needed. 120 tablet 1   ZINC  OXIDE PO Take 1 tablet by mouth daily.     No current facility-administered medications for this visit.     Abtx:  Anti-infectives (From admission, onward)    None       REVIEW OF SYSTEMS:  Const: negative fever, negative chills, negative weight loss Eyes: negative diplopia or visual changes, negative eye pain ENT: negative coryza, negative sore throat Resp: negative cough, hemoptysis, dyspnea Cards: negative for chest pain, palpitations, lower extremity edema GU: negative for frequency, dysuria and hematuria, see above GI: Negative for abdominal pain, diarrhea, bleeding, constipation Skin:  negative for rash and pruritus Heme: negative for easy bruising and gum/nose bleeding MS: negative for myalgias, arthralgias, back pain and muscle weakness Neurolo:negative for headaches, dizziness, vertigo, memory problems  Psych: anxiety Endocrine: negative for thyroid , diabetes Allergy/Immunology- as above- penicillin itching rash: Objective:  VITALS:  BP 132/75   Pulse 60   Temp 97.9 F (36.6 C) (Temporal)   LMP 11/03/2021 (Exact Date)   SpO2 98%   PHYSICAL EXAM:  General: Alert, cooperative, no distress, appears stated age. Is here using a knee scooter Head: Normocephalic, without obvious abnormality, atraumatic. Eyes: Conjunctivae clear, anicteric sclerae. Pupils are equal ENT Nares normal. No drainage or sinus tenderness. Lips, mucosa, and tongue normal. No Thrush Neck: Supple, symmetrical, no adenopathy, thyroid : non tender no carotid bruit and no JVD. Back: No CVA tenderness. Lungs: Clear to auscultation bilaterally. No Wheezing or Rhonchi. No rales. Heart: Regular rate and rhythm, no murmur, rub or gallop. Abdomen:did not examine Extremities: left leg- has a cast Skin: No rashes or lesions. Or bruising Lymph: Cervical, supraclavicular normal. Neurologic: Grossly non-focal Pertinent Labs Lab Results CBC    Component Value Date/Time   WBC 5.4 12/10/2023 1542   RBC 4.25 12/10/2023 1542   HGB 13.0 12/10/2023 1542   HGB 13.4 12/24/2020 1506   HCT 39.3 12/10/2023 1542   HCT 40.6 12/24/2020 1506   PLT 212 12/10/2023 1542   PLT 246 12/24/2020 1506   MCV 92.5 12/10/2023 1542   MCV 90 12/24/2020 1506   MCH 30.6 12/10/2023 1542   MCHC 33.1 12/10/2023 1542   RDW 12.9 12/10/2023 1542   RDW 12.8 12/24/2020 1506   LYMPHSABS 1.2 12/07/2023 0856   LYMPHSABS 1.8 12/24/2020 1506   MONOABS 0.4 12/07/2023 0856   EOSABS 0.1 12/07/2023 0856   EOSABS 0.0 12/24/2020 1506   BASOSABS 0.0 12/07/2023 0856   BASOSABS 0.1 12/24/2020 1506       Latest Ref Rng & Units 12/10/2023  3:42 PM 12/07/2023   11:42 PM 12/07/2023    8:56 AM  CMP  Glucose 70 - 99 mg/dL 98  898  881   BUN 6 - 20 mg/dL 18  22  16    Creatinine 0.44 - 1.00 mg/dL 9.35  9.27  9.27   Sodium 135 - 145 mmol/L 140  141  140   Potassium 3.5 - 5.1 mmol/L 3.7  4.5  3.7   Chloride 98 - 111 mmol/L 103  104  103   CO2 22 - 32 mmol/L 27  27  27    Calcium  8.9 - 10.3 mg/dL 9.5  9.7  9.1   Total Protein 6.5 - 8.1 g/dL  7.1    Total Bilirubin 0.0 - 1.2 mg/dL  0.6    Alkaline Phos 38 - 126 U/L  51    AST 15 - 41 U/L  19    ALT 0 - 44 U/L  17        Microbiology: Recent Results (from the past 240 hours)  Urine Culture     Status: Abnormal   Collection Time: 12/03/23  6:51 PM   Specimen: Urine, Random  Result Value Ref Range Status   Specimen Description   Final    URINE, RANDOM Performed at [facility], [address] [place], [place] [address]    Special Requests   Final    NONE Performed at [facility], [address], [place], [place] [address]    Culture (A)  Final    >=100,000 COLONIES/mL KLEBSIELLA PNEUMONIAE Confirmed Extended Spectrum Beta-Lactamase Producer (ESBL).  In bloodstream infections from ESBL organisms, carbapenems are preferred over piperacillin/tazobactam. They are shown to have a lower risk of mortality.    Report Status 12/06/2023 FINAL  Final   Organism ID, Bacteria KLEBSIELLA PNEUMONIAE (A)  Final      Susceptibility   Klebsiella pneumoniae - MIC*    AMPICILLIN >=32 RESISTANT Resistant     CEFAZOLIN (URINE) Value in next row Resistant      >=32 RESISTANTThis is a modified FDA-approved test that has been validated and its performance characteristics determined by the reporting laboratory.  This laboratory is certified under the Clinical Laboratory Improvement Amendments CLIA as qualified to perform high complexity clinical laboratory testing.    CEFEPIME Value in next row Resistant      >=32 RESISTANTThis is a modified FDA-approved test that has  been validated and its performance characteristics determined by the reporting laboratory.  This laboratory is certified under the Clinical Laboratory Improvement Amendments CLIA as qualified to perform high complexity clinical laboratory testing.    CEFTRIAXONE Value in next row Resistant      >=32 RESISTANTThis is a modified FDA-approved test that has been validated and its performance characteristics determined by the reporting laboratory.  This laboratory is certified under the Clinical Laboratory Improvement Amendments CLIA as qualified to perform high complexity clinical laboratory testing.    CIPROFLOXACIN  Value in next row Resistant      >=32 RESISTANTThis is a modified FDA-approved test that has been validated and its performance characteristics determined by the reporting laboratory.  This laboratory is certified under the Clinical Laboratory Improvement Amendments CLIA as qualified to perform high complexity clinical laboratory testing.    GENTAMICIN  Value in next row Sensitive      >=32 RESISTANTThis is a modified FDA-approved test that has been validated and its performance characteristics determined by the reporting laboratory.  This laboratory is certified under the Clinical Laboratory Improvement  Amendments CLIA as qualified to perform high complexity clinical laboratory testing.    NITROFURANTOIN  Value in next row Resistant      >=32 RESISTANTThis is a modified FDA-approved test that has been validated and its performance characteristics determined by the reporting laboratory.  This laboratory is certified under the Clinical Laboratory Improvement Amendments CLIA as qualified to perform high complexity clinical laboratory testing.    TRIMETH /SULFA  Value in next row Resistant      >=32 RESISTANTThis is a modified FDA-approved test that has been validated and its performance characteristics determined by the reporting laboratory.  This laboratory is certified under the Clinical Laboratory  Improvement Amendments CLIA as qualified to perform high complexity clinical laboratory testing.    AMPICILLIN/SULBACTAM Value in next row Resistant      >=32 RESISTANTThis is a modified FDA-approved test that has been validated and its performance characteristics determined by the reporting laboratory.  This laboratory is certified under the Clinical Laboratory Improvement Amendments CLIA as qualified to perform high complexity clinical laboratory testing.    PIP/TAZO Value in next row Resistant ug/mL     >=128 RESISTANTThis is a modified FDA-approved test that has been validated and its performance characteristics determined by the reporting laboratory.  This laboratory is certified under the Clinical Laboratory Improvement Amendments CLIA as qualified to perform high complexity clinical laboratory testing.    MEROPENEM  Value in next row Sensitive      >=128 RESISTANTThis is a modified FDA-approved test that has been validated and its performance characteristics determined by the reporting laboratory.  This laboratory is certified under the Clinical Laboratory Improvement Amendments CLIA as qualified to perform high complexity clinical laboratory testing.    * >=100,000 COLONIES/mL KLEBSIELLA PNEUMONIAE  Urine Culture (for pregnant, neutropenic or urologic patients or patients with an indwelling urinary catheter)     Status: Abnormal   Collection Time: 12/05/23  6:01 PM   Specimen: Urine, Clean Catch  Result Value Ref Range Status   Specimen Description   Final    URINE, CLEAN CATCH Performed at [facility], [address] [place], [place] [address]    Special Requests   Final    NONE Performed at [facility], [address] [place], [place] [address]    Culture (A)  Final    >=100,000 COLONIES/mL KLEBSIELLA PNEUMONIAE Confirmed Extended Spectrum Beta-Lactamase Producer (ESBL).  In bloodstream infections from ESBL organisms, carbapenems are preferred over  piperacillin/tazobactam. They are shown to have a lower risk of mortality.    Report Status 12/07/2023 FINAL  Final   Organism ID, Bacteria KLEBSIELLA PNEUMONIAE (A)  Final      Susceptibility   Klebsiella pneumoniae - MIC*    AMPICILLIN >=32 RESISTANT Resistant     CEFAZOLIN (URINE) Value in next row Resistant      >=32 RESISTANTThis is a modified FDA-approved test that has been validated and its performance characteristics determined by the reporting laboratory.  This laboratory is certified under the Clinical Laboratory Improvement Amendments CLIA as qualified to perform high complexity clinical laboratory testing.    CEFEPIME Value in next row Resistant      >=32 RESISTANTThis is a modified FDA-approved test that has been validated and its performance characteristics determined by the reporting laboratory.  This laboratory is certified under the Clinical Laboratory Improvement Amendments CLIA as qualified to perform high complexity clinical laboratory testing.    CEFTRIAXONE Value in next row Resistant      >=32 RESISTANTThis is a modified FDA-approved test  that has been validated and its performance characteristics determined by the reporting laboratory.  This laboratory is certified under the Clinical Laboratory Improvement Amendments CLIA as qualified to perform high complexity clinical laboratory testing.    CIPROFLOXACIN  Value in next row Resistant      >=32 RESISTANTThis is a modified FDA-approved test that has been validated and its performance characteristics determined by the reporting laboratory.  This laboratory is certified under the Clinical Laboratory Improvement Amendments CLIA as qualified to perform high complexity clinical laboratory testing.    GENTAMICIN  Value in next row Sensitive      >=32 RESISTANTThis is a modified FDA-approved test that has been validated and its performance characteristics determined by the reporting laboratory.  This laboratory is certified under the  Clinical Laboratory Improvement Amendments CLIA as qualified to perform high complexity clinical laboratory testing.    NITROFURANTOIN  Value in next row Resistant      >=32 RESISTANTThis is a modified FDA-approved test that has been validated and its performance characteristics determined by the reporting laboratory.  This laboratory is certified under the Clinical Laboratory Improvement Amendments CLIA as qualified to perform high complexity clinical laboratory testing.    TRIMETH /SULFA  Value in next row Resistant      >=32 RESISTANTThis is a modified FDA-approved test that has been validated and its performance characteristics determined by the reporting laboratory.  This laboratory is certified under the Clinical Laboratory Improvement Amendments CLIA as qualified to perform high complexity clinical laboratory testing.    AMPICILLIN/SULBACTAM Value in next row Resistant      >=32 RESISTANTThis is a modified FDA-approved test that has been validated and its performance characteristics determined by the reporting laboratory.  This laboratory is certified under the Clinical Laboratory Improvement Amendments CLIA as qualified to perform high complexity clinical laboratory testing.    PIP/TAZO Value in next row Resistant ug/mL     >=128 RESISTANTThis is a modified FDA-approved test that has been validated and its performance characteristics determined by the reporting laboratory.  This laboratory is certified under the Clinical Laboratory Improvement Amendments CLIA as qualified to perform high complexity clinical laboratory testing.    MEROPENEM  Value in next row Sensitive      >=128 RESISTANTThis is a modified FDA-approved test that has been validated and its performance characteristics determined by the reporting laboratory.  This laboratory is certified under the Clinical Laboratory Improvement Amendments CLIA as qualified to perform high complexity clinical laboratory testing.    * >=100,000  COLONIES/mL KLEBSIELLA PNEUMONIAE   ? Impression/Recommendation Recurrent multidrug-resistant urinary tract infections in neurogenic bladder due to transverse myelitis   Recurrent UTIs are linked to neurogenic bladder risk increased  by self-catheterization and incomplete bladder emptying.  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 Ensure adequate hydration and reinforce the use of sterile catheters and proper catheterization technique. *Estrace /Premarin cream topically- peasized apply topically three times a week * Cetaphil to clean the genital area ( not soap)  * Cranberry supplement (-Knudsen cranberry concentrate- 1 ounce mixed with 8 ounces of water *wash with water after bowel movt *Probiotic for vaginal health( can try Pearls vaginal health) * Increase water consumption- 8 glasses a day *Avoid constipation *Avoid antibiotics unless systemic infection/ or before cystoscopy * Kegel Exercise to strengthen pelvic floor    Her another complaint is seeing stool on tissue after wiping after  micturition Multiple CT abd/pelvis have not shown any colovesical/recto vaginal fistula- may need to do cystoscopy /trans vag US     Constipation with overflow diarrhea   Constipation with overflow diarrhea likely contributes to urinary symptoms. Her recent bowel movement was diarrhea, suggesting overflow from constipation. Continue Clearlax, Metamucil, and stool softeners. Advise on proper bowel management to prevent constipation and overflow diarrhea.   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 A previous CT cystogram in April 2025 showed no rectovesical fistula, but a rectovaginal fistula remains a possibility. Symptoms include stool  on tissue after urination, though not consistently. Further investigation is needed to confirm the diagnosis. Refer to urology for evaluation. Discussed the Urology APP regarding cystoscopy- she will discuss with Dr.Sninsky and get back to patient  PT has supra pubic pain- so will get UA/routine microscopy and reflex to culture ?   ________________________________________________ Discussed with patient, in great detail Discussed with  requesting provider Will contact her with urine results

## 2023-12-13 NOTE — Patient Instructions (Signed)
 During today's visit, we discussed your recurrent urinary tract infections (UTIs), constipation with overflow diarrhea, and the possibility of a genitourinary fistula. We reviewed your recent hospitalization and current symptoms, and provided recommendations to help manage your conditions.    *Estrace /Premarin cream topically- peasized apply topically three times a week * Cetaphil to clean the genital area ( not soap)  * Cranberry supplement (-Knudsen cranberry concentrate- 1 ounce mixed with 8 ounces of water *wash with water after bowel movt Aovid constipation- need to have regular bowel movt *Probiotic for vaginal health( can try Pearls vaginal health) * Increase water consumption- 8 glasses a day *Avoid antibiotics unless systemic infection/ or before cystoscopy * Kegel Exercise to strengthen pelvic floor  YOUR PLAN:  -RECURRENT MULTIDRUG-RESISTANT URINARY TRACT INFECTIONS IN NEUROGENIC BLADDER DUE TO TRANSVERSE MYELITIS: Your recurrent UTIs are related to your neurogenic bladder, which is a condition where your bladder doesn't empty properly due to nerve damage from transverse myelitis. This is worsened by the need for self-catheterization. To help manage this, we recommend maintaining proper hygiene by washing with water and Cetaphil soap after bowel movements, using cranberry pills or concentrate to prevent UTIs, and discussing with your primary care physician the potential use of Estrace cream for postmenopausal changes. Ensure you stay well-hydrated and continue using sterile catheters and proper catheterization techniques.  -CONSTIPATION WITH OVERFLOW DIARRHEA: Your constipation, which sometimes leads to overflow diarrhea, may be contributing to your urinary symptoms. Continue taking Clearlax, Metamucil, and stool softeners as prescribed. Proper bowel management is important to prevent constipation and overflow diarrhea. Make sure to clean the perineal area thoroughly after bowel  movements to prevent infections.  You c/o seeing stool after wiping your urethra occasionally- both your urologist and gyn have looked into this and have not found any fistula or leak   INSTRUCTIONS:  Please follow up with your primary care physician to discuss the potential use of Estrace cream. Follow up with gyn and urology

## 2023-12-15 ENCOUNTER — Other Ambulatory Visit: Payer: Self-pay | Admitting: Infectious Diseases

## 2023-12-15 LAB — URINE CULTURE: Culture: >=100000 — AB

## 2023-12-15 MED ORDER — FOSFOMYCIN TROMETHAMINE 3 G PO PACK: 3.0000 g | PACK | ORAL | 0 refills | Status: AC

## 2023-12-15 NOTE — Progress Notes (Signed)
 Spoke to patient about ESBL kleb in the urine culture As some pain suprapubic area. No fever-  She still sees stool in her tisse when she wipes after micturition. She is in communication with urology and they may be contacting her next week regarding cystoscopy- I did speak to them as well Meanwhile patient will have fosfomycin handy- I have sent prescription to her pharmacy [facility]- not to take it now. Antibiotics in her case will not work as if she has a colovesical fistula or recto vaginal fistula that will have to be fixed- if she has unbearable cystitis symptoms then fosfomycin would help

## 2023-12-16 ENCOUNTER — Emergency Department

## 2023-12-16 ENCOUNTER — Other Ambulatory Visit: Payer: Self-pay

## 2023-12-16 ENCOUNTER — Emergency Department
Admission: EM | Admit: 2023-12-16 | Discharge: 2023-12-16 | Disposition: A | Discharge status: Home / Self Care | Attending: Emergency Medicine | Admitting: Emergency Medicine

## 2023-12-16 ENCOUNTER — Encounter: Payer: Self-pay | Admitting: Emergency Medicine

## 2023-12-16 DIAGNOSIS — R102 Pelvic and perineal pain: Secondary | ICD-10-CM

## 2023-12-16 DIAGNOSIS — N390 Urinary tract infection, site not specified: Secondary | ICD-10-CM | POA: Insufficient documentation

## 2023-12-16 DIAGNOSIS — B9689 Other specified bacterial agents as the cause of diseases classified elsewhere: Secondary | ICD-10-CM | POA: Insufficient documentation

## 2023-12-16 DIAGNOSIS — R197 Diarrhea, unspecified: Secondary | ICD-10-CM | POA: Diagnosis not present

## 2023-12-16 DIAGNOSIS — R1030 Lower abdominal pain, unspecified: Secondary | ICD-10-CM | POA: Diagnosis present

## 2023-12-16 LAB — CBC
HCT: 41 % (ref 36.0–46.0)
Hemoglobin: 13.6 g/dL (ref 12.0–15.0)
MCH: 30.8 pg (ref 26.0–34.0)
MCHC: 33.2 g/dL (ref 30.0–36.0)
MCV: 92.8 fL (ref 80.0–100.0)
Platelets: 203 K/uL (ref 150–400)
RBC: 4.42 MIL/uL (ref 3.87–5.11)
RDW: 12.4 % (ref 11.5–15.5)
WBC: 5.3 K/uL (ref 4.0–10.5)
nRBC: 0 % (ref 0.0–0.2)

## 2023-12-16 LAB — URINALYSIS, ROUTINE W REFLEX MICROSCOPIC
Bilirubin Urine: NEGATIVE
Glucose, UA: NEGATIVE mg/dL
Hgb urine dipstick: NEGATIVE
Ketones, ur: NEGATIVE mg/dL
Nitrite: POSITIVE — AB
Protein, ur: NEGATIVE mg/dL
Specific Gravity, Urine: 1.005 (ref 1.005–1.030)
WBC, UA: >50 WBC/hpf (ref 0–5)
pH: 7 (ref 5.0–8.0)

## 2023-12-16 LAB — COMPREHENSIVE METABOLIC PANEL WITH GFR
ALT: 18 U/L (ref 0–44)
AST: 20 U/L (ref 15–41)
Albumin: 4.3 g/dL (ref 3.5–5.0)
Alkaline Phosphatase: 48 U/L (ref 38–126)
Anion gap: 8 (ref 5–15)
BUN: 15 mg/dL (ref 6–20)
CO2: 25 mmol/L (ref 22–32)
Calcium: 9.5 mg/dL (ref 8.9–10.3)
Chloride: 108 mmol/L (ref 98–111)
Creatinine, Ser: 0.47 mg/dL (ref 0.44–1.00)
GFR, Estimated: >60 mL/min (ref >60)
Glucose, Bld: 112 mg/dL — ABNORMAL HIGH (ref 70–99)
Potassium: 3.8 mmol/L (ref 3.5–5.1)
Sodium: 141 mmol/L (ref 135–145)
Total Bilirubin: 0.8 mg/dL (ref 0.0–1.2)
Total Protein: 7.4 g/dL (ref 6.5–8.1)

## 2023-12-16 LAB — LIPASE, BLOOD: Lipase: 54 U/L — ABNORMAL HIGH (ref 11–51)

## 2023-12-16 MED ORDER — ONDANSETRON HCL 4 MG/2ML IJ SOLN
4.0000 mg | Freq: Once | INTRAMUSCULAR | Status: AC
  Administered 2023-12-16: 4 mg via INTRAVENOUS
  Filled 2023-12-16: qty 2

## 2023-12-16 MED ORDER — FOSFOMYCIN TROMETHAMINE 3 G PO PACK
3.0000 g | PACK | Freq: Once | ORAL | Status: AC
  Administered 2023-12-16: 3 g via ORAL
  Filled 2023-12-16: qty 3

## 2023-12-16 MED ORDER — KETOROLAC TROMETHAMINE 15 MG/ML IJ SOLN
15.0000 mg | Freq: Once | INTRAMUSCULAR | Status: AC
  Administered 2023-12-16: 15 mg via INTRAVENOUS
  Filled 2023-12-16: qty 1

## 2023-12-16 MED ORDER — SODIUM CHLORIDE 0.9 % IV BOLUS
1000.0000 mL | Freq: Once | INTRAVENOUS | Status: AC
  Administered 2023-12-16: 1000 mL via INTRAVENOUS

## 2023-12-16 MED ORDER — IOHEXOL 300 MG/ML  SOLN
100.0000 mL | Freq: Once | INTRAMUSCULAR | Status: AC | PRN
  Administered 2023-12-16: 100 mL via INTRAVENOUS

## 2023-12-16 NOTE — Discharge Instructions (Signed)
 Take fosfomycin as prescribed by your infectious disease doctor.  Your first dose was given tonight on 12/16/2023.  Your CT scan did not show any fistula.  Thank you for choosing us  for your health care today!  Please see your primary doctor this week for a follow up appointment.   If you have any new, worsening, or unexpected symptoms call your doctor right away or come back to the emergency department for reevaluation.  It was my pleasure to care for you today.   Ginnie EDISON Cyrena, MD

## 2023-12-16 NOTE — ED Provider Notes (Signed)
 [facility] Provider Note    Event Date/Time   First MD Initiated Contact with Patient 12/16/23 0113     (approximate)   History   Abdominal Pain   HPI  Cristina Wade is a 53 y.o. female   Past medical history of neurogenic bladder due to transverse myelitis, self-catheterization, recurrent multidrug-resistant UTIs who presents to the emergency department with suprapubic pain and liquid stools.  She continues to have some stool after she wipes after urinating, thinks there is stool in her urine, this is a problem that she has spoken with infectious disease, urology, in the past.  She reports no fevers, chills.  She is nauseous but not vomiting.  She is tolerating p.o. intake though decreased over the last couple of days.  She had no GI bleeding.  She had 2 episodes of liquid stool, greenish color yesterday and today.  External Medical Documents Reviewed: Last weeks infectious disease note from Dr. 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  They also note that she has had evaluations for colovesicular/rectovaginal fistulas that have been negative in the past.      Physical Exam   Triage Vital Signs: ED Triage Vitals [12/16/23 0007]  Encounter Vitals Group     BP 127/74     Girls Systolic BP Percentile      Girls Diastolic BP Percentile      Boys Systolic BP Percentile      Boys Diastolic BP Percentile      Pulse Rate 76     Resp 18     Temp 98.1 F (36.7 C)     Temp src      SpO2 100 %     Weight 128 lb (58.1 kg)     Height      Head Circumference      Peak Flow      Pain Score 4     Pain Loc      Pain Education      Exclude from Growth Chart     Most recent vital signs: Vitals:    12/16/23 0330 12/16/23 0342  BP: 102/66 102/66  Pulse: (!) 51   Resp:  18  Temp:  98.1 F (36.7 C)  SpO2: 100%     General: Awake, no distress.  CV:  Good peripheral perfusion.  Resp:  Normal effort.  Abd:  No distention.  Other:  Mild suprapubic tenderness to palpation without rigidity or guarding.  Vital signs normal without fever.  Nontoxic-appearing overall.  Other quadrants of the abdomen soft nontender.   ED Results / Procedures / Treatments   Labs (all labs ordered are listed, but only abnormal results are displayed) Labs Reviewed  LIPASE, BLOOD - Abnormal; Notable for the following components:      Result Value   Lipase 54 (*)    All other components within normal limits  COMPREHENSIVE METABOLIC PANEL WITH GFR - Abnormal; Notable for the following components:   Glucose, Bld 112 (*)    All other components within normal limits  URINALYSIS, ROUTINE W REFLEX MICROSCOPIC - Abnormal; Notable for the following components:   Color, Urine YELLOW (*)    APPearance HAZY (*)    Nitrite POSITIVE (*)    Leukocytes,Ua MODERATE (*)    Bacteria, UA RARE (*)  All other components within normal limits  URINE CULTURE  CBC     I ordered and reviewed the above labs they are notable for nitrite positive bacteriuria with white blood cell    RADIOLOGY I independently reviewed and interpreted CT of the abdomen pelvis to see no obvious inflammatory or obstructive changes I also reviewed radiologist's formal read.   PROCEDURES:  Critical Care performed: No  Procedures   MEDICATIONS ORDERED IN ED: Medications  sodium chloride  0.9 % bolus 1,000 mL (0 mLs Intravenous Stopped 12/16/23 0248)  ondansetron  (ZOFRAN ) injection 4 mg (4 mg Intravenous Given 12/16/23 0150)  ketorolac  (TORADOL ) 15 MG/ML injection 15 mg (15 mg Intravenous Given 12/16/23 0150)  fosfomycin (MONUROL ) packet 3 g (3 g Oral Given 12/16/23 0238)  iohexol  (OMNIPAQUE ) 300 MG/ML solution 100 mL (100 mLs Intravenous  Contrast Given 12/16/23 0230)    External physician / consultants:  I spoke with infectious disease consultant regarding care plan for this patient.   IMPRESSION / MDM / ASSESSMENT AND PLAN / ED COURSE  I reviewed the triage vital signs and the nursing notes.                                Patient's presentation is most consistent with acute presentation with potential threat to life or bodily function.  Differential diagnosis includes, but is not limited to, urinary tract infection, rectovesicular vaginal fistula, intra-abdominal infection, appendicitis, obstruction   The patient is on the cardiac monitor to evaluate for evidence of arrhythmia and/or significant heart rate changes.  MDM:    Suprapubic pain with UTI on urinalysis in this patient with recurrent UTIs, that typically presents similarly with suprapubic pain only without dysuria.  Multidrug-resistant Klebsiella in the past sensitive only to meropenem /ertapenem.  I think this is causing her symptoms today.  She had some diarrhea as well.  She is concerned for fistula.  She has been negative workup for this in the past.  Will get a CT scan to look for such fistula or other acute abdominal pathologies to account for her lower abdominal pain.  Consult with ID regarding the case and past medical history, as the orders for ertapenem/meropenem  ask for ID consultation prior to administering.  Does not appear sepsis, doubt pyelonephritis given no flank pain.  I spoke with Dr. Fayette who actually saw the patient yesterday for the symptoms..  She had prescribed fosfomycin but the patient has not taken it yet.  The patient seems quite fixated on the possibility of a fistula and so she is to get a CT scan tonight to check for that among other acute abdominal pathology possibilities.  Should the CT scan showed no acute abnormalities, we will proceed with the plan for treatment of her UTI with fosfomycin and she will be discharged with  outpatient to follow-up with ID and her primary doctor/urology as needed.       FINAL CLINICAL IMPRESSION(S) / ED DIAGNOSES   Final diagnoses:  Suprapubic pain, acute  Diarrhea, unspecified type  Acute lower urinary tract infection     Rx / DC Orders   ED Discharge Orders     None        Note:  This document was prepared using Dragon voice recognition software and may include unintentional dictation errors.    [name], MD 12/16/23 0400

## 2023-12-16 NOTE — ED Triage Notes (Signed)
 Patient c/o abdominal pain, diarrhea, and seeing stool in her urine x 1 week.

## 2023-12-18 ENCOUNTER — Telehealth: Payer: Self-pay | Admitting: Podiatry

## 2023-12-18 LAB — URINE CULTURE: Culture: >=100000 — AB

## 2023-12-18 NOTE — Telephone Encounter (Signed)
 Patients scooter is broken. She needs medical supplier contact information so she can get it fixed.

## 2023-12-18 NOTE — Telephone Encounter (Signed)
 Left message for patient to contact facility who provided her the scooter.

## 2023-12-20 ENCOUNTER — Telehealth: Payer: Self-pay

## 2023-12-20 ENCOUNTER — Other Ambulatory Visit: Payer: Self-pay | Admitting: Urology

## 2023-12-20 DIAGNOSIS — N321 Vesicointestinal fistula: Secondary | ICD-10-CM

## 2023-12-20 NOTE — Telephone Encounter (Signed)
 Patient called office today stating she took last dose of Fosfomycin this morning. Is still experiencing pelvic pain, but odor has resolved. Would like to know if she needs any additional treatment.  Denies any other symptoms at this time.  She did reach out to urology as well. Was told at this time they would not repeat cystoscopy.  Lorenda CHRISTELLA Code, RMA

## 2023-12-20 NOTE — Telephone Encounter (Signed)
 Spoke with pt and relayed Dr. Merceda message. No questions at this time. Lorenda CHRISTELLA Code, RMA

## 2023-12-21 NOTE — Telephone Encounter (Signed)
 Patient called back to ask if she can have repeat urine culture done to ensure UTI is gone. Denies symptoms. Advised pt to follow up with [facility]/ urology. Lorenda CHRISTELLA Code, RMA

## 2023-12-26 ENCOUNTER — Encounter: Admitting: Podiatry

## 2023-12-26 ENCOUNTER — Ambulatory Visit (INDEPENDENT_AMBULATORY_CARE_PROVIDER_SITE_OTHER): Admitting: Podiatry

## 2023-12-26 DIAGNOSIS — M722 Plantar fascial fibromatosis: Secondary | ICD-10-CM

## 2023-12-26 DIAGNOSIS — S86312D Strain of muscle(s) and tendon(s) of peroneal muscle group at lower leg level, left leg, subsequent encounter: Secondary | ICD-10-CM

## 2023-12-26 NOTE — Progress Notes (Signed)
 [name] presents with her sister today with her knee scooter and cam boot in hand.  She presents with her cast dry clean.  Denies fever chills nausea mobic  muscle aches and pains states that she continues to take her aspirin  daily take her anti-inflammatories when needed.  She continues to ice behind her knee.  Continues to keep the foot elevated.  Objective: Vital signs stable alert oriented x 3 cast is intact dry clean was removed dry sterile dressing was intact was removed demonstrates staples are intact minimal edema no erythema cellulitis drainage or odor.  Staples removed margins remain well coapted she has great range of motion dorsiflexion plantarflexion inversion eversion of the ankle and subtalar joints.  Assessment: Well-healing surgical peroneal tendon repair left.  Plan: At this point, recommend compression anklet and a cam boot for which she will continue to ambulate utilizing her knee scooter with partial weightbearing.  She will progress to full weightbearing.  In 2 weeks we will progress to a Tri-Lock brace.  She will perform her daily exercises which I explained to her and she will ice afterwards.

## 2023-12-27 ENCOUNTER — Telehealth: Payer: Self-pay | Admitting: Podiatry

## 2023-12-27 ENCOUNTER — Other Ambulatory Visit

## 2023-12-27 NOTE — Telephone Encounter (Signed)
 Pt called in stating that she was seen in the [place] office on yesterday to has staples remove and and cast taken off pt was told that she can put weight on her left ankle pt foot is now swollen and in a lot of pain. Pt want to know if she needs to come in today and wants to know if this is normal.please advise. Thank you.

## 2023-12-27 NOTE — Telephone Encounter (Signed)
 Reviewed yesterday's OV note and plan of care. Called patient back and reviewed it with her in full, twice. Per Dr Pansy note she is to be partial weightbearing right now and use ice post exercises. Patient verbalized understanding. She did put more weight on the ankle/foot yesterday so she did have more pain and swelling yesterday but those have improved today. I spoke with Dr. Verta and per that conversation advised patient to remain partial weightbearing and to utilize ice and elevation for pain and swelling as previously instructed. Patient will call back if there are any worsening signs or symptoms.

## 2023-12-28 ENCOUNTER — Telehealth: Payer: Self-pay | Admitting: Podiatry

## 2023-12-28 ENCOUNTER — Encounter: Payer: Self-pay | Admitting: Podiatry

## 2023-12-28 NOTE — Telephone Encounter (Signed)
 Patient called stating she has some swelling of her foot. Patient states she has been on it a lot and it has some throbbing to it is that from the swelling?

## 2024-01-01 ENCOUNTER — Other Ambulatory Visit: Payer: Self-pay | Admitting: Obstetrics

## 2024-01-01 ENCOUNTER — Telehealth: Payer: Self-pay | Admitting: Podiatry

## 2024-01-01 DIAGNOSIS — B961 Klebsiella pneumoniae [K. pneumoniae] as the cause of diseases classified elsewhere: Secondary | ICD-10-CM

## 2024-01-01 NOTE — Telephone Encounter (Signed)
 Spoke to patient advised.

## 2024-01-01 NOTE — Telephone Encounter (Signed)
 Patient states left foot side near the toes. Pain has been going on 6 days. She isn't taking pain medicine because it can bother her stomach. She mentioned the incision spot is tender to the touch and throbs sometimes. She has an appointment next Wednesday but doesn't know if she should come in before then. She rates the pain about a 4 or 5. Are these symptoms normal or should she come in the office?

## 2024-01-01 NOTE — Telephone Encounter (Signed)
 Thank you Dr. Verta and Mikel for addressing this.

## 2024-01-02 ENCOUNTER — Other Ambulatory Visit

## 2024-01-02 ENCOUNTER — Telehealth: Payer: Self-pay | Admitting: Urology

## 2024-01-02 NOTE — Telephone Encounter (Signed)
 Please schedule cystoscopy.  She will need IV antibiotics before and after, so when you have a date scheduled please send a message to Dr. Searcy with infectious disease so she can coordinate IV antibiotics  Appointment scheduled for November in [place], pt didn't want to come to [place] and needed late afternoon appointment.  Dr. Fayette was made aware.

## 2024-01-03 ENCOUNTER — Encounter: Payer: Self-pay | Admitting: Family

## 2024-01-03 ENCOUNTER — Inpatient Hospital Stay: Admitting: Family

## 2024-01-03 ENCOUNTER — Telehealth: Payer: Self-pay | Admitting: Podiatry

## 2024-01-03 NOTE — Telephone Encounter (Signed)
 Pt left message with just name and phone number.  I returned call and left message for pt to call us  back that I was not sure what she was calling in regards to.

## 2024-01-03 NOTE — Telephone Encounter (Signed)
 Noted

## 2024-01-04 ENCOUNTER — Telehealth: Payer: Self-pay

## 2024-01-04 NOTE — Telephone Encounter (Signed)
 Patient left voicemail stating she was encouraged to see a gynecologist. The GYN did a urine test that grew the same bacteria as before and they will be sending the results to Dr. Fayette to review and advise.   Cristina Wade, BSN, RN

## 2024-01-07 ENCOUNTER — Emergency Department
Admission: EM | Admit: 2024-01-07 | Discharge: 2024-01-07 | Disposition: A | Discharge status: Home / Self Care | Attending: Emergency Medicine | Admitting: Emergency Medicine

## 2024-01-07 ENCOUNTER — Encounter: Payer: Self-pay | Admitting: Emergency Medicine

## 2024-01-07 ENCOUNTER — Other Ambulatory Visit: Payer: Self-pay

## 2024-01-07 ENCOUNTER — Encounter: Payer: Self-pay | Admitting: *Deleted

## 2024-01-07 DIAGNOSIS — R748 Abnormal levels of other serum enzymes: Secondary | ICD-10-CM | POA: Diagnosis not present

## 2024-01-07 DIAGNOSIS — R1030 Lower abdominal pain, unspecified: Secondary | ICD-10-CM | POA: Insufficient documentation

## 2024-01-07 DIAGNOSIS — R350 Frequency of micturition: Secondary | ICD-10-CM | POA: Diagnosis not present

## 2024-01-07 DIAGNOSIS — R102 Pelvic and perineal pain: Secondary | ICD-10-CM

## 2024-01-07 LAB — COMPREHENSIVE METABOLIC PANEL WITH GFR
ALT: 16 U/L (ref 0–44)
AST: 24 U/L (ref 15–41)
Albumin: 4.3 g/dL (ref 3.5–5.0)
Alkaline Phosphatase: 45 U/L (ref 38–126)
Anion gap: 12 (ref 5–15)
BUN: 12 mg/dL (ref 6–20)
CO2: 25 mmol/L (ref 22–32)
Calcium: 9.4 mg/dL (ref 8.9–10.3)
Chloride: 103 mmol/L (ref 98–111)
Creatinine, Ser: 0.67 mg/dL (ref 0.44–1.00)
GFR, Estimated: >60 mL/min (ref >60)
Glucose, Bld: 89 mg/dL (ref 70–99)
Potassium: 3.9 mmol/L (ref 3.5–5.1)
Sodium: 140 mmol/L (ref 135–145)
Total Bilirubin: 0.7 mg/dL (ref 0.0–1.2)
Total Protein: 7 g/dL (ref 6.5–8.1)

## 2024-01-07 LAB — CBC WITH DIFFERENTIAL/PLATELET
Abs Immature Granulocytes: 0.01 K/uL (ref 0.00–0.07)
Basophils Absolute: 0 K/uL (ref 0.0–0.1)
Basophils Relative: 1 %
Eosinophils Absolute: 0.1 K/uL (ref 0.0–0.5)
Eosinophils Relative: 2 %
HCT: 41.6 % (ref 36.0–46.0)
Hemoglobin: 13.8 g/dL (ref 12.0–15.0)
Immature Granulocytes: 0 %
Lymphocytes Relative: 26 %
Lymphs Abs: 1.2 K/uL (ref 0.7–4.0)
MCH: 30.9 pg (ref 26.0–34.0)
MCHC: 33.2 g/dL (ref 30.0–36.0)
MCV: 93.1 fL (ref 80.0–100.0)
Monocytes Absolute: 0.5 K/uL (ref 0.1–1.0)
Monocytes Relative: 11 %
Neutro Abs: 2.7 K/uL (ref 1.7–7.7)
Neutrophils Relative %: 60 %
Platelets: 193 K/uL (ref 150–400)
RBC: 4.47 MIL/uL (ref 3.87–5.11)
RDW: 12.3 % (ref 11.5–15.5)
WBC: 4.5 K/uL (ref 4.0–10.5)
nRBC: 0 % (ref 0.0–0.2)

## 2024-01-07 LAB — URINALYSIS, COMPLETE (UACMP) WITH MICROSCOPIC
Bilirubin Urine: NEGATIVE
Glucose, UA: NEGATIVE mg/dL
Hgb urine dipstick: NEGATIVE
Ketones, ur: NEGATIVE mg/dL
Leukocytes,Ua: NEGATIVE
Nitrite: NEGATIVE
Protein, ur: NEGATIVE mg/dL
Specific Gravity, Urine: 1.005 (ref 1.005–1.030)
Squamous Epithelial / HPF: 0 /HPF (ref 0–5)
pH: 6 (ref 5.0–8.0)

## 2024-01-07 LAB — LIPASE, BLOOD: Lipase: 52 U/L — ABNORMAL HIGH (ref 11–51)

## 2024-01-07 NOTE — ED Provider Triage Note (Signed)
 Emergency Medicine Provider Triage Evaluation Note  Cristina Wade , a 53 y.o. female  was evaluated in triage.  Pt complains of urinary frequency and urgency with pelvic and lower back pain.  Hx of recurrent Klebsiella UTIs.  This is the 9th ED visit in 6 months.  Review of Systems  Positive: urinary sxs Negative: fever, tachycardia  Physical Exam  BP 122/74 (BP Location: Right Arm)   Pulse 70   Temp 98.4 F (36.9 C) (Oral)   Resp 17   Ht 1.778 m (5' 10)   Wt 58.1 kg   LMP 11/03/2021 (Exact Date)   SpO2 100%   BMI 18.37 kg/m  Gen:   Awake, no distress   Resp:  Normal effort  MSK:   Moves extremities without difficulty   Medical Decision Making  Medically screening exam initiated at 6:19 AM.  Appropriate orders placed.  [name] was informed that the remainder of the evaluation will be completed by another provider, this initial triage assessment does not replace that evaluation, and the importance of remaining in the ED until their evaluation is complete.  Vitals stable.  Basic labs including complete UA and culture ordered.   Gordan Huxley, MD 01/07/24 [phone]

## 2024-01-07 NOTE — ED Triage Notes (Signed)
 Pt arrives from home POV.  C/O urinary frequency and urgency. Endorses pelvic and lower back pain.  States bad odor to urine.

## 2024-01-07 NOTE — Discharge Instructions (Signed)
 You were seen in the emergency department today for evaluation of your continued suprapubic pain and urinary symptoms.  Your testing today was fortunately overall reassuring.  As we discussed, with your clinical history I do not recommend antibiotics currently and this is in line with what the infectious disease specialist recommends.  Please continue to follow-up as an outpatient for your further testing.  You can discuss with your urologist if they recommend a referral to a further specialist such as a urogynecologist.  Please return to the ER if you develop new or worsening symptoms such as severe abdominal pain, fevers, or any other new or concerning symptoms.

## 2024-01-07 NOTE — ED Provider Notes (Signed)
 [facility] Provider Note    Event Date/Time   First MD Initiated Contact with Patient 01/07/24 [phone]     (approximate)   History   Abdominal Pain and Urinary Frequency   HPI  Cristina Wade is a 53 year old female with history of neurogenic bladder, recurrent UTIs presenting to the emergency department for evaluation of urinary frequency and lower abdominal pain.  Patient reports that for few weeks she has had urinary frequency with lower abdominal pain.  Has also noticed some malodor to her urine.  Reports that she was told that urine culture sent from her gynecology office demonstrated positive urine findings but they wanted to discuss with infectious disease to determine if she needs antibiotics.  Reports she has not received an update from this and was not sure if she needed antibiotics.  She denies fevers, changes in her symptoms over the past few days.  I reviewed her infectious disease note from 12/13/2023.  At that time, it was recommended that she avoid antibiotics unless she had systemic infection or before cystoscopy with other supportive care measures to prevent infection noted.  I reviewed recent communication from 01/04/2024.  UA sent by OB on 9/12 did not have significant white blood cells, urine culture grew multidrug-resistant Klebsiella.  Dr. Fayette noted that this was likely from colonization or stool contamination.  She continued to recommend holding off on antibiotics unless patient develops systemic symptoms and noted that patient had been treated twice in the past 3 weeks without improvement. She recommended not checking urine routinely.  She recommended continue evaluation for colovesicular or rectovaginal fistula.      Physical Exam   Triage Vital Signs: ED Triage Vitals  Encounter Vitals Group     BP 01/07/24 0612 122/74     Girls Systolic BP Percentile --      Girls Diastolic BP Percentile --      Boys Systolic BP Percentile --       Boys Diastolic BP Percentile --      Pulse Rate 01/07/24 0612 70     Resp 01/07/24 0612 17     Temp 01/07/24 0612 98.4 F (36.9 C)     Temp Source 01/07/24 0612 Oral     SpO2 01/07/24 0612 100 %     Weight 01/07/24 0615 128 lb (58.1 kg)     Height 01/07/24 0615 5' 10 (1.778 m)     Head Circumference --      Peak Flow --      Pain Score 01/07/24 0614 4     Pain Loc --      Pain Education --      Exclude from Growth Chart --     Most recent vital signs: Vitals:   01/07/24 0653 01/07/24 0700  BP:  100/68  Pulse: 72 69  Resp:  16  Temp:    SpO2: 100% 100%     General: Awake, interactive  CV:  Regular rate, good peripheral perfusion.  Resp:  Unlabored respirations, lungs clear to auscultation Abd:  Nondistended, soft, no significant tenderness to palpation Neuro:  Symmetric facial movement, fluid speech   ED Results / Procedures / Treatments   Labs (all labs ordered are listed, but only abnormal results are displayed) Labs Reviewed  LIPASE, BLOOD - Abnormal; Notable for the following components:      Result Value   Lipase 52 (*)    All other components within normal limits  URINALYSIS, COMPLETE (UACMP) WITH MICROSCOPIC -  Abnormal; Notable for the following components:   Color, Urine YELLOW (*)    APPearance CLEAR (*)    Bacteria, UA FEW (*)    All other components within normal limits  URINE CULTURE  CBC WITH DIFFERENTIAL/PLATELET  COMPREHENSIVE METABOLIC PANEL WITH GFR     EKG EKG independently reviewed and interpreted by myself demonstrates:    RADIOLOGY Imaging independently reviewed and interpreted by myself demonstrates:   Formal Radiology Read:  No results found.  PROCEDURES:  Critical Care performed: No  Procedures   MEDICATIONS ORDERED IN ED: Medications - No data to display   IMPRESSION / MDM / ASSESSMENT AND PLAN / ED COURSE  I reviewed the triage vital signs and the nursing notes.  Differential diagnosis includes, but is not  limited to, ongoing suprapubic pain related to neurogenic bladder, possible fistula, lower suspicion acute intra-abdominal process given reassuring abdominal exam, CT scan within the last month without acute findings, consideration for acute UTI though lower suspicion given absence of systemic symptoms, unchanged symptoms over the past few weeks  Patient's presentation is most consistent with acute presentation with potential threat to life or bodily function.  53 year old female presenting to the emergency department for evaluation of suprapubic pain and urinary symptoms.  These have been ongoing for at least a few weeks for the patient.  She has a history of recurrent UTIs with extensive multidrug resistance, but I did review recent communication with infectious disease and her outpatient care team.  It does appear that the recommendation is to hold off on antibiotics unless patient has systemic symptoms.  Triage physician did order lab work on the patient which demonstrates reassuring CBC including white blood cell count of 4.5, reassuring CMP.  Lipase minimally elevated, not consistent with pancreatitis.  Urinalysis is overall not consistent with infection with only 6-10 white blood cells and few bacteria with negative nitrites and leukocyte esterase.  A urine culture was sent.  With patient's history I would not be surprised if this does return positive, specifically for Klebsiella.  However, given her clinical presentation with concerns for colonization or possible contaminant, do think it is reasonable to hold off on treatment unless she were to develop systemic symptoms as per infectious disease recommendation.  I counseled patient extensively on why we are holding off on treatment, specifically given concerns for worsening drug resistance particularly with her extensive MDR already.  She did express understanding.  She will continue to follow-up with her outpatient care team including GI, urology,  gynecology for further evaluation of her symptoms.  Strict return precautions were provided.  Patient was discharged in stable condition.   FINAL CLINICAL IMPRESSION(S) / ED DIAGNOSES   Final diagnoses:  Suprapubic pain  Urinary frequency     Rx / DC Orders   ED Discharge Orders     None        Note:  This document was prepared using Dragon voice recognition software and may include unintentional dictation errors.   Levander Slate, MD 01/07/24 [phone]

## 2024-01-08 NOTE — Telephone Encounter (Signed)
 Secure chat sent to Dr. Fayette making her aware of patient rescheduling her appointment for 10/2 in [place].

## 2024-01-09 ENCOUNTER — Ambulatory Visit (INDEPENDENT_AMBULATORY_CARE_PROVIDER_SITE_OTHER): Admitting: Podiatry

## 2024-01-09 DIAGNOSIS — S86312D Strain of muscle(s) and tendon(s) of peroneal muscle group at lower leg level, left leg, subsequent encounter: Secondary | ICD-10-CM | POA: Diagnosis not present

## 2024-01-09 LAB — URINE CULTURE: Culture: 60000 — AB

## 2024-01-09 NOTE — Progress Notes (Signed)
 She presents today for follow-up of her peroneal tendon repair left foot November 16, 2023.  She states that is still little sore and numb on top and some tenderness along the side continues to wear her boot and compression anklet.  States that she has been walking some with the boot but not a lot.  Objective: Vital signs are stable alert oriented x 3.  Much decrease in edema no erythema cellulitis drainage odor she has good range of motion though somewhat tender.  Assessment: Well-healing surgical foot.  Plan: We are going to start her with physical therapy and place her in a Tri-Lock brace I will follow-up with her in 1 month after physical therapy is complete.

## 2024-01-11 ENCOUNTER — Other Ambulatory Visit: Payer: Self-pay | Admitting: Lab

## 2024-01-11 ENCOUNTER — Telehealth: Payer: Self-pay | Admitting: Podiatry

## 2024-01-11 ENCOUNTER — Telehealth: Payer: Self-pay | Admitting: Lab

## 2024-01-11 NOTE — Telephone Encounter (Signed)
 Pt checking on status of referral for PT. Last seen on 01/09/24

## 2024-01-11 NOTE — Telephone Encounter (Signed)
 Patient inquiring about physical therapy referral that has been discussed with provider.

## 2024-01-14 ENCOUNTER — Telehealth: Payer: Self-pay | Admitting: Lab

## 2024-01-14 ENCOUNTER — Telehealth: Payer: Self-pay | Admitting: Podiatry

## 2024-01-14 ENCOUNTER — Other Ambulatory Visit: Payer: Self-pay

## 2024-01-14 DIAGNOSIS — S86312D Strain of muscle(s) and tendon(s) of peroneal muscle group at lower leg level, left leg, subsequent encounter: Secondary | ICD-10-CM

## 2024-01-14 DIAGNOSIS — M722 Plantar fascial fibromatosis: Secondary | ICD-10-CM

## 2024-01-14 DIAGNOSIS — M25372 Other instability, left ankle: Secondary | ICD-10-CM

## 2024-01-14 NOTE — Telephone Encounter (Signed)
 Patient is calling about PT referral states should have been ordered when discussed.Would like to know who will we be referring her to?

## 2024-01-14 NOTE — Telephone Encounter (Signed)
 Patient states that her insurance requires a different billing code for the date of service 11/16/2023. She reports leaving several voice messages and has not yet received a call back. Please contact the patient at your earliest convenience. Thank you.

## 2024-01-15 ENCOUNTER — Telehealth: Payer: Self-pay | Admitting: Lab

## 2024-01-15 ENCOUNTER — Ambulatory Visit
Admission: RE | Admit: 2024-01-15 | Discharge: 2024-01-15 | Disposition: A | Discharge status: Home / Self Care | Source: Ambulatory Visit | Attending: Infectious Diseases | Admitting: Infectious Diseases

## 2024-01-15 DIAGNOSIS — Z1612 Extended spectrum beta lactamase (ESBL) resistance: Secondary | ICD-10-CM | POA: Insufficient documentation

## 2024-01-15 DIAGNOSIS — B999 Unspecified infectious disease: Secondary | ICD-10-CM | POA: Diagnosis not present

## 2024-01-15 MED ORDER — ERTAPENEM SODIUM 1 G IJ SOLR
1.0000 g | Freq: Once | INTRAMUSCULAR | Status: AC
  Administered 2024-01-15: 1 g via INTRAVENOUS
  Filled 2024-01-15: qty 1000

## 2024-01-15 NOTE — Telephone Encounter (Signed)
 Patient is requesting an updated work note and a call letting her know how much longer will be out of work still having pain in foot and very unsteady when applying weight please advise.

## 2024-01-16 ENCOUNTER — Ambulatory Visit
Admission: RE | Admit: 2024-01-16 | Discharge: 2024-01-16 | Disposition: A | Discharge status: Home / Self Care | Source: Ambulatory Visit | Attending: Infectious Diseases | Admitting: Infectious Diseases

## 2024-01-16 ENCOUNTER — Ambulatory Visit (INDEPENDENT_AMBULATORY_CARE_PROVIDER_SITE_OTHER): Admitting: Podiatry

## 2024-01-16 DIAGNOSIS — N390 Urinary tract infection, site not specified: Secondary | ICD-10-CM | POA: Diagnosis present

## 2024-01-16 DIAGNOSIS — R8271 Bacteriuria: Secondary | ICD-10-CM | POA: Diagnosis not present

## 2024-01-16 DIAGNOSIS — R319 Hematuria, unspecified: Secondary | ICD-10-CM | POA: Diagnosis not present

## 2024-01-16 DIAGNOSIS — M722 Plantar fascial fibromatosis: Secondary | ICD-10-CM

## 2024-01-16 DIAGNOSIS — N319 Neuromuscular dysfunction of bladder, unspecified: Secondary | ICD-10-CM | POA: Insufficient documentation

## 2024-01-16 DIAGNOSIS — S86312D Strain of muscle(s) and tendon(s) of peroneal muscle group at lower leg level, left leg, subsequent encounter: Secondary | ICD-10-CM

## 2024-01-16 MED ORDER — ERTAPENEM SODIUM 1 G IJ SOLR
1.0000 g | Freq: Once | INTRAMUSCULAR | Status: AC
  Administered 2024-01-16: 1 g via INTRAVENOUS
  Filled 2024-01-16: qty 1000

## 2024-01-16 NOTE — Progress Notes (Signed)
 Cristina Wade presents today concerned about some shooting pain and tenderness along the incision site and on the bottom of the left heel.  She says it does not feel the same in the left foot and leg as it does in her right foot leg.  She states that the strength is not the same and I feel weak.  Objective: Vital signs are stable alert oriented x 3.  Denies fever chills nausea vomit muscle aches pains calf pain back pain chest pain shortness of breath.  She has allodynic type pain on palpation of the peroneal incision site.  There is no edema in this area.  She has tenderness in the plantar medial longitudinal arch which is what we would expect after a fasciotomy.  She has some tenderness around the ankle most likely from cast compression.  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  Plan: She will start physical therapy at [facility] on Tuesday of next week for strengthening and desensitization.  We did discuss contrast baths today for the allodynia and she is to do that 3 times a day 5 minutes each time.

## 2024-01-17 ENCOUNTER — Ambulatory Visit
Admission: RE | Admit: 2024-01-17 | Discharge: 2024-01-17 | Disposition: A | Source: Ambulatory Visit | Attending: Infectious Diseases | Admitting: Infectious Diseases

## 2024-01-17 ENCOUNTER — Ambulatory Visit (INDEPENDENT_AMBULATORY_CARE_PROVIDER_SITE_OTHER): Admitting: Urology

## 2024-01-17 VITALS — BP 115/71 | HR 81

## 2024-01-17 DIAGNOSIS — N390 Urinary tract infection, site not specified: Secondary | ICD-10-CM | POA: Diagnosis not present

## 2024-01-17 DIAGNOSIS — R319 Hematuria, unspecified: Secondary | ICD-10-CM | POA: Diagnosis not present

## 2024-01-17 MED ORDER — ERTAPENEM SODIUM 1 G IJ SOLR
1.0000 g | Freq: Once | INTRAMUSCULAR | Status: AC
  Administered 2024-01-17: 1 g via INTRAVENOUS
  Filled 2024-01-17: qty 1000

## 2024-01-17 MED ORDER — LIDOCAINE HCL URETHRAL/MUCOSAL 2 % EX GEL
1.0000 | Freq: Once | CUTANEOUS | Status: AC
  Administered 2024-01-17: 1 via URETHRAL

## 2024-01-17 NOTE — Progress Notes (Signed)
 Cystoscopy Procedure Note:  Indication: Concern for colovesical fistula  Complex patient with neurogenic bladder managed with [facility], reported concern for a colovesical fistula though multiple CT scans have been benign from a urologic perspective, and she has had multiple negative cystoscopies in the past  After informed consent and discussion of the procedure and its risks, Cristina Wade was positioned and prepped in the standard fashion. Cystoscopy was performed with a flexible cystoscope. The urethra, bladder neck and entire bladder was visualized in a standard fashion. The ureteral orifices were visualized in their normal location and orientation.  Bladder mucosa was grossly normal throughout.  No abnormalities on retroflexion  Findings: Normal cystoscopy  Assessment and Plan: Normal cystoscopy  Redell Burnet, MD 01/17/2024

## 2024-01-18 ENCOUNTER — Emergency Department
Admission: EM | Admit: 2024-01-18 | Discharge: 2024-01-18 | Disposition: A | Discharge status: Home / Self Care | Attending: Emergency Medicine | Admitting: Emergency Medicine

## 2024-01-18 ENCOUNTER — Encounter: Payer: Self-pay | Admitting: Infectious Diseases

## 2024-01-18 ENCOUNTER — Ambulatory Visit
Admission: RE | Admit: 2024-01-18 | Discharge: 2024-01-18 | Disposition: A | Discharge status: Home / Self Care | Source: Ambulatory Visit | Attending: Infectious Diseases | Admitting: Infectious Diseases

## 2024-01-18 ENCOUNTER — Other Ambulatory Visit: Payer: Self-pay

## 2024-01-18 DIAGNOSIS — R6883 Chills (without fever): Secondary | ICD-10-CM | POA: Insufficient documentation

## 2024-01-18 DIAGNOSIS — J45909 Unspecified asthma, uncomplicated: Secondary | ICD-10-CM | POA: Diagnosis not present

## 2024-01-18 DIAGNOSIS — R1024 Suprapubic pain: Secondary | ICD-10-CM | POA: Insufficient documentation

## 2024-01-18 DIAGNOSIS — R350 Frequency of micturition: Secondary | ICD-10-CM | POA: Diagnosis present

## 2024-01-18 DIAGNOSIS — M545 Low back pain, unspecified: Secondary | ICD-10-CM | POA: Diagnosis not present

## 2024-01-18 DIAGNOSIS — R319 Hematuria, unspecified: Secondary | ICD-10-CM | POA: Diagnosis not present

## 2024-01-18 LAB — BASIC METABOLIC PANEL WITH GFR
Anion gap: 10 (ref 5–15)
BUN: 15 mg/dL (ref 6–20)
CO2: 26 mmol/L (ref 22–32)
Calcium: 9.2 mg/dL (ref 8.9–10.3)
Chloride: 105 mmol/L (ref 98–111)
Creatinine, Ser: 0.53 mg/dL (ref 0.44–1.00)
GFR, Estimated: >60 mL/min (ref >60)
Glucose, Bld: 94 mg/dL (ref 70–99)
Potassium: 4 mmol/L (ref 3.5–5.1)
Sodium: 141 mmol/L (ref 135–145)

## 2024-01-18 LAB — CBC
HCT: 39.9 % (ref 36.0–46.0)
Hemoglobin: 13.3 g/dL (ref 12.0–15.0)
MCH: 30.6 pg (ref 26.0–34.0)
MCHC: 33.3 g/dL (ref 30.0–36.0)
MCV: 91.9 fL (ref 80.0–100.0)
Platelets: 205 K/uL (ref 150–400)
RBC: 4.34 MIL/uL (ref 3.87–5.11)
RDW: 12.3 % (ref 11.5–15.5)
WBC: 4.9 K/uL (ref 4.0–10.5)
nRBC: 0 % (ref 0.0–0.2)

## 2024-01-18 LAB — URINALYSIS, ROUTINE W REFLEX MICROSCOPIC
Bilirubin Urine: NEGATIVE
Glucose, UA: NEGATIVE mg/dL
Hgb urine dipstick: NEGATIVE
Ketones, ur: NEGATIVE mg/dL
Leukocytes,Ua: NEGATIVE
Nitrite: NEGATIVE
Protein, ur: NEGATIVE mg/dL
Specific Gravity, Urine: 1.005 (ref 1.005–1.030)
pH: 6 (ref 5.0–8.0)

## 2024-01-18 LAB — RESP PANEL BY RT-PCR (RSV, FLU A&B, COVID)  RVPGX2
Influenza A by PCR: NEGATIVE
Influenza B by PCR: NEGATIVE
Resp Syncytial Virus by PCR: NEGATIVE
SARS Coronavirus 2 by RT PCR: NEGATIVE

## 2024-01-18 LAB — POC URINE PREG, ED: Preg Test, Ur: NEGATIVE

## 2024-01-18 MED ORDER — ACETAMINOPHEN 500 MG PO TABS
1000.0000 mg | ORAL_TABLET | Freq: Once | ORAL | Status: DC
Start: 2024-01-18 — End: 2024-01-18
  Filled 2024-01-18: qty 2

## 2024-01-18 MED ORDER — SODIUM CHLORIDE 0.9 % IV SOLN
1.0000 g | Freq: Once | INTRAVENOUS | Status: AC
  Administered 2024-01-18: 1 g via INTRAVENOUS
  Filled 2024-01-18: qty 1000

## 2024-01-18 NOTE — ED Provider Notes (Addendum)
 [facility] Provider Note    Event Date/Time   First MD Initiated Contact with Patient 01/18/24 [phone]     (approximate)   History   Urinary Frequency   HPI  Cristina Wade is a 53 y.o. female with history of chronic pelvic pain, anxiety, asthma, recurrent UTIs, history of neurogenic bladder, presenting with pelvic cramping, chills.  States that she had IV antibiotics obtained today.  No fever or nausea vomiting or diarrhea.  States that suprapubic cramping has been ongoing for a while, she also notes some lower back aching.  States that chills has been ongoing for 3 days but no body aches.   On independent chart review, she had IV ertapenem given today with ID.  Does have history of recurrent multidrug-resistant UTIs, ID had recommended not getting repeat UAs unless she has systemic symptoms.  She was seen by urology, had a cystoscopy done that did not show any colovesical fistula, did also have an MRI pelvis that did not show any rectal fistula.  GI believes her symptoms are secondary to severe constipation.     Physical Exam   Triage Vital Signs: ED Triage Vitals  Encounter Vitals Group     BP 01/18/24 0926 119/84     Girls Systolic BP Percentile --      Girls Diastolic BP Percentile --      Boys Systolic BP Percentile --      Boys Diastolic BP Percentile --      Pulse Rate 01/18/24 0926 67     Resp 01/18/24 0926 16     Temp 01/18/24 0926 97.9 F (36.6 C)     Temp Source 01/18/24 0926 Oral     SpO2 01/18/24 0926 96 %     Weight 01/18/24 0929 128 lb (58.1 kg)     Height 01/18/24 0929 5' 10 (1.778 m)     Head Circumference --      Peak Flow --      Pain Score 01/18/24 0928 4     Pain Loc --      Pain Education --      Exclude from Growth Chart --     Most recent vital signs: Vitals:   01/18/24 0926  BP: 119/84  Pulse: 67  Resp: 16  Temp: 97.9 F (36.6 C)  SpO2: 96%     General: Awake, no distress.  CV:  Good peripheral perfusion.   Resp:  Normal effort.  Abd:  No distention.  Soft nontender Other:  Nontoxic-appearing, no CVA tenderness or flank tenderness bilaterally, she is eating a granola bar while in bed.   ED Results / Procedures / Treatments   Labs (all labs ordered are listed, but only abnormal results are displayed) Labs Reviewed  URINALYSIS, ROUTINE W REFLEX MICROSCOPIC - Abnormal; Notable for the following components:      Result Value   Color, Urine COLORLESS (*)    APPearance CLEAR (*)    All other components within normal limits  RESP PANEL BY RT-PCR (RSV, FLU A&B, COVID)  [id number]  BASIC METABOLIC PANEL WITH GFR  CBC  POC URINE PREG, ED     PROCEDURES:  Critical Care performed: No  Procedures   MEDICATIONS ORDERED IN ED: Medications  acetaminophen  (TYLENOL ) tablet 1,000 mg (1,000 mg Oral Patient Refused/Not Given 01/18/24 1004)     IMPRESSION / MDM / ASSESSMENT AND PLAN / ED COURSE  I reviewed the triage vital signs and the nursing notes.  Differential diagnosis includes, but is not limited to, bladder dysfunction, bladder irritation, UTI, no CVA tenderness to suggest pyelonephritis at this time, they considered sepsis but she is afebrile, not tachycardic or tachypneic, blood pressures are stable.  No abdominal pain or CVA tenderness on exam, no fever, considered but no indication for imaging at this time.  Given that she is already receiving IV ertapenem and received a dose today, will reach out to infectious disease to discuss her care.  Labs and UA were obtained out of triage, will add a respiratory viral panel.  Will give her some Tylenol  for the suprapubic cramping.  Patient's presentation is most consistent with acute presentation with potential threat to life or bodily function.  Independent interpretation of labs below.  Discussed with ID, who states that her symptoms are not consistent with UTI.  States that she was getting the IV ertapenem due to her  cystoscopy and today is her last dose.  On reassessment patient is requesting discharge, she is stable in the emergency department, not septic, no indication for inpatient admission at this time, she safe for outpatient management.  Will discharge with strict return precautions. Instructed her to follow-up with primary care doctor next week to get reassessed.    Clinical Course as of 01/18/24 1146  Fri Jan 18, 2024  9041 Independent review of labs, pregnancy test is negative, UA is negative, no leukocytosis [TT]  1146 Resp panel by RT-PCR (RSV, Flu A&B, Covid) Anterior Nasal Swab Negative [TT]    Clinical Course User Index [TT] Waymond Lorelle Cummins, MD     FINAL CLINICAL IMPRESSION(S) / ED DIAGNOSES   Final diagnoses:  Suprapubic cramping  Chills     Rx / DC Orders   ED Discharge Orders     None        Note:  This document was prepared using Dragon voice recognition software and may include unintentional dictation errors.    Waymond Lorelle Cummins, MD 01/18/24 1144    Waymond Lorelle Cummins, MD 01/18/24 1146

## 2024-01-18 NOTE — ED Triage Notes (Addendum)
 Pt states she has been on IV's and don't think they are working for bladder infection. Pt states bladder pain, kidney pain, and chills. Pt states urinary frequency.  Pt states she had an IV today but still has pain.

## 2024-01-18 NOTE — Discharge Instructions (Signed)
 Please patient follow-up with your primary care doctor next week to get reassessed.

## 2024-01-22 ENCOUNTER — Encounter: Admission: RE | Payer: Self-pay | Source: Home / Self Care

## 2024-01-22 ENCOUNTER — Other Ambulatory Visit

## 2024-01-22 ENCOUNTER — Ambulatory Visit: Admission: RE | Admit: 2024-01-22 | Source: Home / Self Care

## 2024-01-22 SURGERY — COLONOSCOPY
Anesthesia: General

## 2024-01-24 ENCOUNTER — Ambulatory Visit: Payer: Self-pay

## 2024-01-24 DIAGNOSIS — D123 Benign neoplasm of transverse colon: Secondary | ICD-10-CM | POA: Diagnosis present

## 2024-01-24 DIAGNOSIS — K640 First degree hemorrhoids: Secondary | ICD-10-CM | POA: Diagnosis not present

## 2024-01-28 NOTE — Progress Notes (Signed)
 01/29/2024 9:28 AM   Cristina Wade 02-09-1971 [id number]  Referring provider: Dineen Rollene MATSU, FNP [address] [place],  [place] [address]  Urological history: 1.  High risk hematuria - non-smoker - Workup (2015) - bilateral punctate stones - cysto (2019) - NED - non-contrast CT (2022) -punctate bilateral stones - cysto (2022) - NED - RUS (08/2022) - bilateral stones - PET scan ((09/2022) - NED - CTU (12/2022) - bilateral nephrolithiasis - RUS (06/2023) - bilateral nephrolithiasis - contrast CT (11/2023) -punctate lower pole kidney stones - cysto (01/2024) NED   2. Neurogenic bladder -UDS 2017 indicating nonobstructive urinary retention with poor bladder sensation and external sphincter dyssynergia -completed 12 weeks of PTNS -completed pelvic floor PT -manages with self cathing three times daily  3. Nephrolithiasis -non-contrast CT 2022 2 mm stone in the right kidney lower pole without hydronephrosis.  Three tiny stones in left kidney lower pole region without hydronephrosis -CTU (12/2022) -bilateral nephrolithiasis -RUS (06/2023) - bilateral non obstructing stones  - contrast CT (11/2023) -punctate lower pole kidney stones  4. rUTI's - cysto x 2 - NED - several CT's - large stool burden - CT cystogram (07/2023) -no findings for colovesicular fistula - MRI pelvis (08/2023) -no evidence for colovesicular fistula - cysto (01/2024) no evidence for fistula   Chief Complaint  Patient presents with   Recurrent UTI   HPI: Cristina Wade is a 53 y.o. woman who presents today for 2 month follow up with her sister, Cristina Wade.  Previous records reviewed.   This was a routine follow-up visit that was scheduled previously.  She does not have any specific issues at this time.  She does continue to endorse her lower abdominal pain.  She has had several CT scans, CT cystogram, MR of the pelvis and cystoscopy all of which have not discovered an etiology for her lower  abdominal pain.  She did have findings of constipation, so maybe this is contributing to her lower abdominal pain.  UA yellow slightly cloudy, specific gravity 1.010, pH 6.5, 0-5 WBCs, 0-2 RBCs, 0-10 epithelial cells, granular casts present, amorphous sediment crystals present and many bacteria.  Serum creatinine (01/2024) serum creatinine 0.53, eGFR is greater than 60  Hemoglobin A1c (10/2022) 5.7  PMH: Past Medical History:  Diagnosis Date   Anxiety    Asthma    Chronic pelvic pain in female 2018   Cystitis    Cystocele    Endometrial polyp    Endometriosis    per pt report but never seen during surg   Frequent headaches    Gastritis    GERD (gastroesophageal reflux disease)    rare   Gross hematuria    History of kidney stones    Microscopic hematuria    Migraine    Migraines    Neuropathy    Osteopenia    Urinary disorder    UTI (lower urinary tract infection)     Surgical History: Past Surgical History:  Procedure Laterality Date   COLONOSCOPY N/A 11/05/2014   Procedure: COLONOSCOPY;  Surgeon: Lamar ONEIDA Holmes, MD;  Location: [facility] ENDOSCOPY;  Service: Endoscopy;  Laterality: N/A;   COLONOSCOPY  11/2016   Dr. Holmes   CYSTOSCOPY  2007   with biopsy    CYSTOSCOPY N/A 06/01/2016   Procedure: CYSTOSCOPY;  Surgeon: Glory High, MD;  Location: [facility] ORS;  Service: Gynecology;  Laterality: N/A;   DIAGNOSTIC LAPAROSCOPY     DILATATION & CURETTAGE/HYSTEROSCOPY WITH MYOSURE N/A 04/13/2015   Procedure: DILATATION &  CURETTAGE/HYSTEROSCOPY WITH MYOSURE/POLYPECTOMY;  Surgeon: Mitzie BROCKS Ward, MD;  Location: [facility] ORS;  Service: Gynecology;  Laterality: N/A;   DILATATION & CURETTAGE/HYSTEROSCOPY WITH MYOSURE N/A 06/01/2016   Procedure: DILATATION & CURETTAGE/HYSTEROSCOPY WITH MYOSURE;  Surgeon: Glory High, MD;  Location: [facility] ORS;  Service: Gynecology;  Laterality: N/A;   DILATION AND CURETTAGE OF UTERUS     ESOPHAGOGASTRODUODENOSCOPY (EGD) WITH PROPOFOL  N/A  08/31/2022   Procedure: ESOPHAGOGASTRODUODENOSCOPY (EGD) WITH PROPOFOL ;  Surgeon: Onita Elspeth Sharper, DO;  Location: [facility] ENDOSCOPY;  Service: Gastroenterology;  Laterality: N/A;   GUM SURGERY     laproscopy  2007   POLYPECTOMY     endometrial    Home Medications:  Allergies as of 01/29/2024       Reactions   Lactose Intolerance (gi)    Pt consumes milk in recipes and dairy foods as as ice cream and yogurt, however, states her stomach will hurt if she consumes too much. She was also told milk produces excess mucous    Omeprazole  Other (See Comments)   Pt reports Chest pain, HA and Dysuria. 01/05/22 patient states she is able to take omeprazole  now without a reaction   Penicillins Itching, Rash        Medication List        Accurate as of January 29, 2024 11:59 PM. If you have any questions, ask your nurse or doctor.          aspirin  EC 81 MG tablet Take 81 mg by mouth daily. Swallow whole.   BORIC ACID EX 40 mg by Other route 2 (two) times daily.   CALCIUM  1200 PO Take 2-3 tablets by mouth 2 (two) times daily. Reported on 05/26/2015   cholecalciferol  10 MCG (400 UNIT) Tabs tablet Commonly known as: VITAMIN D3 Take 400 Units by mouth daily.   clobetasol cream 0.05 % Commonly known as: TEMOVATE Apply 1 Application topically 2 (two) times daily.   methenamine 1 g tablet Commonly known as: Hiprex Take 1 tablet (1 g total) by mouth 2 (two) times daily with a meal. Started by: CLOTILDA CORNWALL   MULTIVITAMIN ADULT PO Take 1 tablet by mouth daily.   pantoprazole  40 MG tablet Commonly known as: PROTONIX  Take 1 tablet (40 mg total) by mouth 2 (two) times daily before a meal.   PEPCID  PO Take by mouth.   Probiotic Daily Caps Take 1 capsule by mouth daily.   psyllium 0.52 g capsule Commonly known as: REGULOID Take 0.52 g by mouth daily.   sucralfate  1 g tablet Commonly known as: Carafate  Take 1 tablet (1 g total) by mouth at bedtime as needed.   ZINC   OXIDE PO Take 1 tablet by mouth daily.        Allergies:  Allergies  Allergen Reactions   Lactose Intolerance (Gi)     Pt consumes milk in recipes and dairy foods as as ice cream and yogurt, however, states her stomach will hurt if she consumes too much. She was also told milk produces excess mucous    Omeprazole  Other (See Comments)    Pt reports Chest pain, HA and Dysuria. 01/05/22 patient states she is able to take omeprazole  now without a reaction   Penicillins Itching and Rash    Family History: Family History  Problem Relation Age of Onset   Arthritis Mother    Heart disease Mother    Stroke Mother        TIA   Hypertension Mother    Diabetes Mother  Kidney cancer Mother 4   Heart failure Mother    Hyperlipidemia Mother    Colon cancer Father 35   Arthritis Father    Hyperlipidemia Father    Transient ischemic attack Father    Cancer - Colon Father 55   Prostate cancer Father 54   Valvular heart disease Father    Cancer Maternal Grandmother    Cancer Paternal Grandfather        prostate   Breast cancer Cousin 54       has contact    Social History:  reports that she has never smoked. She has never been exposed to tobacco smoke. She has never used smokeless tobacco. She reports that she does not drink alcohol and does not use drugs.  ROS: Pertinent ROS in HPI  Physical Exam: BP 119/74   Pulse 68   Ht 5' 10 (1.778 m)   Wt 128 lb (58.1 kg)   LMP 11/03/2021 (Exact Date)   SpO2 96%   BMI 18.37 kg/m   Constitutional:  Well nourished. Alert and oriented, No acute distress. HEENT: [place] AT, moist mucus membranes.  Trachea midline Cardiovascular: No clubbing, cyanosis, or edema. Respiratory: Normal respiratory effort, no increased work of breathing. Neurologic: Grossly intact, no focal deficits, moving all 4 extremities. Psychiatric: Normal mood and affect.    Laboratory Data: See EPIC and HPI  I have reviewed the labs.   Pertinent  Imaging: N/A    Assessment & Plan:    1.  Neurogenic bladder - Continue self-catheterization  2. Chronic pelvic pain - She has had several CT scans, a pelvic MRI and cystoscopy-all which have been normal  3. High risk hematuria - She has had several CT scans, pelvic MRI, and several cystoscopies all which have not had any worrisome findings  4. Vaginal atrophy - Continue vaginal estrogen cream as prescribed  5. rUTI's vs colonization - Multiple attempts have been made to explain the diagnosis and treatment plan. Patient continues to show limited comprehension, suggesting possible barriers to understanding. Will continue to reinforce education and engage support system. - I will start on Hiprex 1 g twice daily, to see if this will sterilize her urine and reduce the amount of antibiotic exposure she receives -She denied any specific UTI symptoms at today's visit, but still insisted on her urine being sent for culture -I will not prescribe an antibiotic at this time and when the urine culture result is available, I will speak with her to see exactly what symptoms she is experiencing and if she is not experiencing UTI symptoms encouraged her to continue with the Hiprex, vaginal estrogen cream and to not take antibiotics or visit other providers unless she starts to experience specific UTI symptoms (dysuria, gross hematuria or fevers)  Return for Follow up pending labs.  These notes generated with voice recognition software. I apologize for typographical errors.  CLOTILDA HELON RIGGERS  [facility] [address]  [address] [place], [place] [address] [phone]

## 2024-01-29 ENCOUNTER — Ambulatory Visit: Admitting: Urology

## 2024-01-29 ENCOUNTER — Ambulatory Visit (INDEPENDENT_AMBULATORY_CARE_PROVIDER_SITE_OTHER): Admitting: Urology

## 2024-01-29 ENCOUNTER — Encounter: Payer: Self-pay | Admitting: Urology

## 2024-01-29 VITALS — BP 119/74 | HR 68 | Ht 70.0 in | Wt 128.0 lb

## 2024-01-29 DIAGNOSIS — R319 Hematuria, unspecified: Secondary | ICD-10-CM | POA: Diagnosis not present

## 2024-01-29 DIAGNOSIS — G8929 Other chronic pain: Secondary | ICD-10-CM

## 2024-01-29 DIAGNOSIS — N952 Postmenopausal atrophic vaginitis: Secondary | ICD-10-CM | POA: Diagnosis not present

## 2024-01-29 DIAGNOSIS — R8271 Bacteriuria: Secondary | ICD-10-CM

## 2024-01-29 DIAGNOSIS — N319 Neuromuscular dysfunction of bladder, unspecified: Secondary | ICD-10-CM | POA: Diagnosis not present

## 2024-01-29 DIAGNOSIS — R102 Pelvic and perineal pain unspecified side: Secondary | ICD-10-CM

## 2024-01-29 MED ORDER — METHENAMINE HIPPURATE 1 G PO TABS
1.0000 g | ORAL_TABLET | Freq: Two times a day (BID) | ORAL | 1 refills | Status: AC
Start: 2024-01-29 — End: ?

## 2024-01-30 ENCOUNTER — Ambulatory Visit: Admitting: Podiatry

## 2024-01-30 DIAGNOSIS — S86312D Strain of muscle(s) and tendon(s) of peroneal muscle group at lower leg level, left leg, subsequent encounter: Secondary | ICD-10-CM

## 2024-01-30 LAB — URINALYSIS, COMPLETE
Bilirubin, UA: NEGATIVE
Glucose, UA: NEGATIVE
Ketones, UA: NEGATIVE
Leukocytes,UA: NEGATIVE
Nitrite, UA: NEGATIVE
Protein,UA: NEGATIVE
RBC, UA: NEGATIVE
Specific Gravity, UA: 1.01 (ref 1.005–1.030)
Urobilinogen, Ur: 0.2 mg/dL (ref 0.2–1.0)
pH, UA: 6.5 (ref 5.0–7.5)

## 2024-01-30 LAB — MICROSCOPIC EXAMINATION

## 2024-01-31 NOTE — Progress Notes (Signed)
 She presents today with her sister Cristina Wade concerned that she is having issues with her surgical foot left.  She presents today still utilizing the knee scooter after I had told her that she is in an ankle brace and that she should be walking on that foot.  She states that she did not know that.  She has been to physical therapy twice she has pain in the fifth metatarsal head of the left foot which has tyloma beneath the fifth met head on physical exam.  Objective: Pain on palpation of fifth metatarsal head left foot tyloma subfifth met head left ankle joint is in good position much decrease in edema and erythema no cellulitis drainage or odor no open lesions or wounds.  She has good abduction against resistance.  Assessment: She has well-healing surgical foot.  Plan: Encouraged her to discontinue the use of the knee scooter and start utilizing her ankle brace with her shoe and ambulating.

## 2024-02-03 LAB — CULTURE, URINE COMPREHENSIVE

## 2024-02-04 ENCOUNTER — Encounter: Payer: Self-pay | Admitting: Podiatry

## 2024-02-04 ENCOUNTER — Ambulatory Visit: Payer: Self-pay | Admitting: Urology

## 2024-02-05 ENCOUNTER — Telehealth: Payer: Self-pay | Admitting: Lab

## 2024-02-05 NOTE — Telephone Encounter (Signed)
 Patient calling states having severe heal pain left foot has a bump and heard something pop unable to walk swelling and numbness of toes wants to know what to do please advise.

## 2024-02-06 ENCOUNTER — Ambulatory Visit: Admitting: Podiatry

## 2024-02-06 ENCOUNTER — Telehealth: Payer: Self-pay | Admitting: Podiatry

## 2024-02-06 ENCOUNTER — Ambulatory Visit (INDEPENDENT_AMBULATORY_CARE_PROVIDER_SITE_OTHER): Admitting: Podiatry

## 2024-02-06 DIAGNOSIS — S86312D Strain of muscle(s) and tendon(s) of peroneal muscle group at lower leg level, left leg, subsequent encounter: Secondary | ICD-10-CM

## 2024-02-06 NOTE — Progress Notes (Signed)
 She presents again today date of surgery November 16, 2023 peroneal tendon repair left states that she started physical therapy is gone 2-3 times and felt a pop in her ankle she was wondering if that is normal and she is wondering why her foot is numb and it is numb sometimes but not all the time and she is also wondering while she is not walking correctly.  Objective: Vital signs are stable alert oriented x 3.  Pulses are palpable.  She has some minimal edema no erythema cellulitis drainage odor no ecchymosis.  She has abduction against resistance which is mildly tender I am and she states that it hurts.  The majority of her tenderness is located over the incision site where her scar tissue is the worst.  Assessment: Painful scar adhesions overlying the peroneal tendons.  Plan: I explained to her not to over tighten her brace.  And that will help with the numbness.  I expressed to her that she can get out of her brace while she is at home but she must wear a tennis shoe at all times.

## 2024-02-06 NOTE — Telephone Encounter (Signed)
 Patient would like to know if she can be seen today, due to left foot heel is hurting

## 2024-02-11 ENCOUNTER — Other Ambulatory Visit: Payer: Self-pay

## 2024-02-11 ENCOUNTER — Other Ambulatory Visit: Payer: Self-pay | Admitting: Gastroenterology

## 2024-02-11 ENCOUNTER — Other Ambulatory Visit

## 2024-02-11 DIAGNOSIS — R3989 Other symptoms and signs involving the genitourinary system: Secondary | ICD-10-CM

## 2024-02-11 DIAGNOSIS — R8290 Unspecified abnormal findings in urine: Secondary | ICD-10-CM

## 2024-02-12 ENCOUNTER — Other Ambulatory Visit

## 2024-02-12 ENCOUNTER — Ambulatory Visit: Payer: Self-pay | Admitting: Urology

## 2024-02-12 ENCOUNTER — Other Ambulatory Visit (INDEPENDENT_AMBULATORY_CARE_PROVIDER_SITE_OTHER)

## 2024-02-12 DIAGNOSIS — Z87898 Personal history of other specified conditions: Secondary | ICD-10-CM | POA: Diagnosis not present

## 2024-02-12 DIAGNOSIS — E785 Hyperlipidemia, unspecified: Secondary | ICD-10-CM | POA: Diagnosis not present

## 2024-02-12 DIAGNOSIS — R3989 Other symptoms and signs involving the genitourinary system: Secondary | ICD-10-CM

## 2024-02-12 LAB — URINALYSIS, COMPLETE
Bilirubin, UA: NEGATIVE
Glucose, UA: NEGATIVE
Ketones, UA: NEGATIVE
Leukocytes,UA: NEGATIVE
Nitrite, UA: NEGATIVE
Protein,UA: NEGATIVE
RBC, UA: NEGATIVE
Specific Gravity, UA: 1.01 (ref 1.005–1.030)
Urobilinogen, Ur: 0.2 mg/dL (ref 0.2–1.0)
pH, UA: 6 (ref 5.0–7.5)

## 2024-02-12 LAB — MICROSCOPIC EXAMINATION

## 2024-02-12 LAB — LIPID PANEL
Cholesterol: 183 mg/dL (ref 0–200)
HDL: 80 mg/dL (ref >39.00)
LDL Cholesterol: 94 mg/dL (ref 0–99)
NonHDL: 103.26
Total CHOL/HDL Ratio: 2
Triglycerides: 45 mg/dL (ref 0.0–149.0)
VLDL: 9 mg/dL (ref 0.0–40.0)

## 2024-02-12 LAB — HEMOGLOBIN A1C: Hgb A1c MFr Bld: 5.7 % (ref 4.6–6.5)

## 2024-02-12 LAB — VITAMIN D 25 HYDROXY (VIT D DEFICIENCY, FRACTURES): VITD: 40.83 ng/mL (ref 30.00–100.00)

## 2024-02-13 ENCOUNTER — Telehealth: Payer: Self-pay | Admitting: Podiatry

## 2024-02-13 ENCOUNTER — Ambulatory Visit: Payer: Self-pay | Admitting: Family

## 2024-02-13 NOTE — Telephone Encounter (Signed)
 Patient requesting a work note confirming her limitations stated in prior message.

## 2024-02-13 NOTE — Telephone Encounter (Signed)
 Patient states her boss will allow her sit for an hour then stand for 1.5 hour take a 30 min break ,stand for 30 minutes then sit for an hour and stand for another 1.5 hour.  Based on this schedule, do you think she can return to work and see how things go?

## 2024-02-14 ENCOUNTER — Encounter: Payer: Self-pay | Admitting: Podiatry

## 2024-02-14 NOTE — Telephone Encounter (Signed)
 Per pt wanting note to explain restrictions/accommodations. I emailed addr on file.

## 2024-02-15 ENCOUNTER — Encounter: Payer: Self-pay | Admitting: Family

## 2024-02-16 ENCOUNTER — Ambulatory Visit
Admission: RE | Admit: 2024-02-16 | Discharge: 2024-02-16 | Disposition: A | Discharge status: Home / Self Care | Source: Ambulatory Visit | Attending: Gastroenterology | Admitting: Gastroenterology

## 2024-02-16 ENCOUNTER — Ambulatory Visit

## 2024-02-16 DIAGNOSIS — R8290 Unspecified abnormal findings in urine: Secondary | ICD-10-CM | POA: Insufficient documentation

## 2024-02-16 MED ORDER — GADOBUTROL 1 MMOL/ML IV SOLN
5.0000 mL | Freq: Once | INTRAVENOUS | Status: AC | PRN
  Administered 2024-02-16: 5 mL via INTRAVENOUS

## 2024-02-18 ENCOUNTER — Telehealth: Payer: Self-pay | Admitting: Podiatry

## 2024-02-18 ENCOUNTER — Ambulatory Visit: Admitting: Podiatry

## 2024-02-18 NOTE — Telephone Encounter (Signed)
 Patient is requesting to be released back to work on tomorrow, Nov 4th, instead of standing for 6 hours a day, she will be standing for 3 hours a day. [phone] Patient contact #

## 2024-02-20 ENCOUNTER — Other Ambulatory Visit: Admitting: Urology

## 2024-02-21 ENCOUNTER — Encounter: Payer: Self-pay | Admitting: Podiatry

## 2024-02-28 ENCOUNTER — Telehealth: Payer: Self-pay

## 2024-02-28 ENCOUNTER — Other Ambulatory Visit: Payer: Self-pay | Admitting: Urology

## 2024-02-28 DIAGNOSIS — R3989 Other symptoms and signs involving the genitourinary system: Secondary | ICD-10-CM

## 2024-02-28 MED ORDER — FOSFOMYCIN TROMETHAMINE 3 G PO PACK: 3.0000 g | PACK | Freq: Once | ORAL | 0 refills | Status: AC

## 2024-02-28 NOTE — Telephone Encounter (Signed)
 Pt had called wanting to schedule appt for tomorrow. After discussing with Clotilda, PA I called pt to triage her. Pt was seen by gyn on Oct 30 and a UA and Culture was done. Pt was referred to follow up with us  for positive culture result. Pt was not treated at that time. I called pt and she said she's currently having pain in her lower back, her bladder is hurting and she notes occasional blood in urine. She has had chills but no fever. We sent her in appropriate antibiotics per her culture and she was instructed to keep her upcoming appt at the end of the month. Pt voiced understanding.

## 2024-02-29 ENCOUNTER — Ambulatory Visit: Admitting: Urology

## 2024-03-03 ENCOUNTER — Ambulatory Visit: Admitting: Podiatry

## 2024-03-03 ENCOUNTER — Telehealth: Payer: Self-pay | Admitting: Urology

## 2024-03-03 DIAGNOSIS — S86312D Strain of muscle(s) and tendon(s) of peroneal muscle group at lower leg level, left leg, subsequent encounter: Secondary | ICD-10-CM

## 2024-03-03 NOTE — Progress Notes (Signed)
 Cristina Wade presents today with her sister [name].  She is stating that she still having some swelling in her left foot she is working light duty at work and still has some pain in the foot and continues to go to physical therapy.  She is status post peroneal tendon repair left November 16, 2023.  Objective: Vital signs stable alert oriented x 3 much decrease in edema even after having been on her feet all day today.  She wears her brace at work but does not wear it at home all the time.  She states that she has some tenderness on dorsiflexion and plantarflexion.  But no really sensitive spots on palpation.  Assessment: Well-healing surgical ankle left.  Plan: Follow-up with her in 6 weeks continue physical therapy.  Also instructed her on some simple physical therapy at home as well.

## 2024-03-03 NOTE — Telephone Encounter (Signed)
 Would you call Cristina Wade and see if she was able to pick up the fosfomycin from [facility] over the weekend?  And will you check to see that she is feeling better if she was able to take the antibiotic?  Let me know her response.

## 2024-03-03 NOTE — Telephone Encounter (Signed)
Called patient and left a voicemail to call us back.

## 2024-03-07 ENCOUNTER — Other Ambulatory Visit: Payer: Self-pay | Admitting: Gastroenterology

## 2024-03-07 DIAGNOSIS — R195 Other fecal abnormalities: Secondary | ICD-10-CM

## 2024-03-09 NOTE — Progress Notes (Unsigned)
 03/10/2024 8:43 PM   Cristina Wade May 28, 1970 [id number]  Referring provider: Dineen Rollene MATSU, FNP [address] [place],  [place] [address]  Urological history: 1.  High risk hematuria - non-smoker - Workup (2015) - bilateral punctate stones - cysto (2019) - NED - non-contrast CT (2022) -punctate bilateral stones - cysto (2022) - NED - RUS (08/2022) - bilateral stones - PET scan ((09/2022) - NED - CTU (12/2022) - bilateral nephrolithiasis - RUS (06/2023) - bilateral nephrolithiasis - contrast CT (11/2023) -punctate lower pole kidney stones - cysto (01/2024) NED  - MR abdomen (02/2024) - NED   2. Neurogenic bladder -UDS 2017 indicating nonobstructive urinary retention with poor bladder sensation and external sphincter dyssynergia -completed 12 weeks of PTNS -completed pelvic floor PT -manages with self cathing three times daily  3. Nephrolithiasis -non-contrast CT 2022 2 mm stone in the right kidney lower pole without hydronephrosis.  Three tiny stones in left kidney lower pole region without hydronephrosis -CTU (12/2022) -bilateral nephrolithiasis -RUS (06/2023) - bilateral non obstructing stones  -contrast CT (11/2023) -punctate lower pole kidney stones  4. rUTI's - cysto x 2 - NED - several CT's - large stool burden - CT cystogram (07/2023) -no findings for colovesicular fistula - MRI pelvis (08/2023) -no evidence for colovesicular fistula - cysto (01/2024) no evidence for fistula   No chief complaint on file.  HPI: Cristina Wade is a 53 y.o. woman who presents today for follow up with her sister, [name].  Previous records reviewed.   UA ***   PMH: Past Medical History:  Diagnosis Date   Anxiety    Asthma    Chronic pelvic pain in female 2018   Cystitis    Cystocele    Endometrial polyp    Endometriosis    per pt report but never seen during surg   Frequent headaches    Gastritis    GERD (gastroesophageal reflux disease)    rare    Gross hematuria    History of kidney stones    Microscopic hematuria    Migraine    Migraines    Neuropathy    Osteopenia    Urinary disorder    UTI (lower urinary tract infection)     Surgical History: Past Surgical History:  Procedure Laterality Date   COLONOSCOPY N/A 11/05/2014   Procedure: COLONOSCOPY;  Surgeon: Lamar ONEIDA Holmes, MD;  Location: [facility] ENDOSCOPY;  Service: Endoscopy;  Laterality: N/A;   COLONOSCOPY  11/2016   Dr. Holmes   CYSTOSCOPY  2007   with biopsy    CYSTOSCOPY N/A 06/01/2016   Procedure: CYSTOSCOPY;  Surgeon: Glory High, MD;  Location: [facility] ORS;  Service: Gynecology;  Laterality: N/A;   DIAGNOSTIC LAPAROSCOPY     DILATATION & CURETTAGE/HYSTEROSCOPY WITH MYOSURE N/A 04/13/2015   Procedure: DILATATION & CURETTAGE/HYSTEROSCOPY WITH MYOSURE/POLYPECTOMY;  Surgeon: Mitzie BROCKS Ward, MD;  Location: [facility] ORS;  Service: Gynecology;  Laterality: N/A;   DILATATION & CURETTAGE/HYSTEROSCOPY WITH MYOSURE N/A 06/01/2016   Procedure: DILATATION & CURETTAGE/HYSTEROSCOPY WITH MYOSURE;  Surgeon: Glory High, MD;  Location: [facility] ORS;  Service: Gynecology;  Laterality: N/A;   DILATION AND CURETTAGE OF UTERUS     ESOPHAGOGASTRODUODENOSCOPY (EGD) WITH PROPOFOL  N/A 08/31/2022   Procedure: ESOPHAGOGASTRODUODENOSCOPY (EGD) WITH PROPOFOL ;  Surgeon: Onita Elspeth Sharper, DO;  Location: [facility] ENDOSCOPY;  Service: Gastroenterology;  Laterality: N/A;   GUM SURGERY     laproscopy  2007   POLYPECTOMY     endometrial    Home Medications:  Allergies as  of 03/10/2024       Reactions   Lactose Intolerance (gi)    Pt consumes milk in recipes and dairy foods as as ice cream and yogurt, however, states her stomach will hurt if she consumes too much. She was also told milk produces excess mucous    Omeprazole  Other (See Comments)   Pt reports Chest pain, HA and Dysuria. 01/05/22 patient states she is able to take omeprazole  now without a reaction   Penicillins Itching, Rash         Medication List        Accurate as of March 09, 2024  8:43 PM. If you have any questions, ask your nurse or doctor.          aspirin  EC 81 MG tablet Take 81 mg by mouth daily. Swallow whole.   BORIC ACID EX 40 mg by Other route 2 (two) times daily.   CALCIUM  1200 PO Take 2-3 tablets by mouth 2 (two) times daily. Reported on 05/26/2015   cholecalciferol  10 MCG (400 UNIT) Tabs tablet Commonly known as: VITAMIN D3 Take 400 Units by mouth daily.   clobetasol cream 0.05 % Commonly known as: TEMOVATE Apply 1 Application topically 2 (two) times daily.   methenamine  1 g tablet Commonly known as: Hiprex  Take 1 tablet (1 g total) by mouth 2 (two) times daily with a meal.   MULTIVITAMIN ADULT PO Take 1 tablet by mouth daily.   pantoprazole  40 MG tablet Commonly known as: PROTONIX  Take 1 tablet (40 mg total) by mouth 2 (two) times daily before a meal.   PEPCID  PO Take by mouth.   psyllium 0.52 g capsule Commonly known as: REGULOID Take 0.52 g by mouth daily.   sucralfate  1 g tablet Commonly known as: Carafate  Take 1 tablet (1 g total) by mouth at bedtime as needed.   ZINC  OXIDE PO Take 1 tablet by mouth daily.        Allergies:  Allergies  Allergen Reactions   Lactose Intolerance (Gi)     Pt consumes milk in recipes and dairy foods as as ice cream and yogurt, however, states her stomach will hurt if she consumes too much. She was also told milk produces excess mucous    Omeprazole  Other (See Comments)    Pt reports Chest pain, HA and Dysuria. 01/05/22 patient states she is able to take omeprazole  now without a reaction   Penicillins Itching and Rash    Family History: Family History  Problem Relation Age of Onset   Arthritis Mother    Heart disease Mother    Stroke Mother        TIA   Hypertension Mother    Diabetes Mother    Kidney cancer Mother 73   Heart failure Mother    Hyperlipidemia Mother    Colon cancer Father 25   Arthritis Father     Hyperlipidemia Father    Transient ischemic attack Father    Cancer - Colon Father 15   Prostate cancer Father 42   Valvular heart disease Father    Cancer Maternal Grandmother    Cancer Paternal Grandfather        prostate   Breast cancer Cousin 13       has contact    Social History:  reports that she has never smoked. She has never been exposed to tobacco smoke. She has never used smokeless tobacco. She reports that she does not drink alcohol and does not use drugs.  ROS:  Pertinent ROS in HPI  Physical Exam: LMP 11/03/2021 (Exact Date)   Constitutional:  Well nourished. Alert and oriented, No acute distress. HEENT: [place] AT, moist mucus membranes.  Trachea midline Cardiovascular: No clubbing, cyanosis, or edema. Respiratory: Normal respiratory effort, no increased work of breathing. Neurologic: Grossly intact, no focal deficits, moving all 4 extremities. Psychiatric: Normal mood and affect.    Laboratory Data: See EPIC and HPI  I have reviewed the labs.   Pertinent Imaging: N/A  Assessment & Plan:    1.  Neurogenic bladder - Continue self-catheterization  2. Chronic pelvic pain - She has had several CT scans, a pelvic MRI and cystoscopy-all which have been normal - may be secondary to constipation   3. High risk hematuria - She has had several CT scans, pelvic MRI, and several cystoscopies all which have not had any worrisome findings  4. Vaginal atrophy - Continue vaginal estrogen cream as prescribed  5. rUTI's vs colonization - Patient has undergone cystoscopy, several CT scans, several MRIs without identifying a nidus for her recurrent urinary tract infections/symptoms - continue vaginal estrogen cream as it is first-line therapy for postmenopausal women  - patient is instructed to increase their water intake until the urine is pale yellow or clear (10 to 12 cups daily)  - start taking probiotics that include  Lactobacillus crispatus in premenopausal women  and oral capsules with Lactobacillus rhamnosus GR-1 and Lactobacillus reuteri RC-14 in postmenopausal women *** - continue Hiprex  1 gram twice daily if creatinine clearance is > 30  - refer to GI to address constipation issues and see if she is a candidate for a transanal irrigation system as these have been shown to reduce recurrent UTIs                                             No follow-ups on file.  These notes generated with voice recognition software. I apologize for typographical errors.  CLOTILDA HELON RIGGERS  [facility] [address]  [address] [place], [place] [address] [phone]

## 2024-03-10 ENCOUNTER — Ambulatory Visit (INDEPENDENT_AMBULATORY_CARE_PROVIDER_SITE_OTHER): Admitting: Urology

## 2024-03-10 ENCOUNTER — Encounter: Payer: Self-pay | Admitting: Urology

## 2024-03-10 VITALS — BP 122/73 | HR 74 | Wt 128.0 lb

## 2024-03-10 DIAGNOSIS — R102 Pelvic and perineal pain unspecified side: Secondary | ICD-10-CM | POA: Diagnosis not present

## 2024-03-10 DIAGNOSIS — R319 Hematuria, unspecified: Secondary | ICD-10-CM

## 2024-03-10 DIAGNOSIS — R8271 Bacteriuria: Secondary | ICD-10-CM

## 2024-03-10 DIAGNOSIS — N952 Postmenopausal atrophic vaginitis: Secondary | ICD-10-CM | POA: Diagnosis not present

## 2024-03-10 DIAGNOSIS — G8929 Other chronic pain: Secondary | ICD-10-CM

## 2024-03-10 DIAGNOSIS — N319 Neuromuscular dysfunction of bladder, unspecified: Secondary | ICD-10-CM | POA: Diagnosis not present

## 2024-03-10 LAB — MICROSCOPIC EXAMINATION
Epithelial Cells (non renal): NONE SEEN /HPF (ref 0–10)
RBC, Urine: NONE SEEN /HPF (ref 0–2)

## 2024-03-10 LAB — URINALYSIS, COMPLETE
Bilirubin, UA: NEGATIVE
Glucose, UA: NEGATIVE
Ketones, UA: NEGATIVE
Leukocytes,UA: NEGATIVE
Nitrite, UA: NEGATIVE
Protein,UA: NEGATIVE
RBC, UA: NEGATIVE
Specific Gravity, UA: <1.005 — ABNORMAL LOW (ref 1.005–1.030)
Urobilinogen, Ur: 0.2 mg/dL (ref 0.2–1.0)
pH, UA: 6 (ref 5.0–7.5)

## 2024-03-11 ENCOUNTER — Ambulatory Visit
Admission: RE | Admit: 2024-03-11 | Discharge: 2024-03-11 | Disposition: A | Discharge status: Home / Self Care | Source: Ambulatory Visit | Attending: Gastroenterology | Admitting: Gastroenterology

## 2024-03-11 DIAGNOSIS — R195 Other fecal abnormalities: Secondary | ICD-10-CM | POA: Diagnosis present

## 2024-03-11 MED ORDER — IOHEXOL 300 MG/ML  SOLN
100.0000 mL | Freq: Once | INTRAMUSCULAR | Status: AC | PRN
  Administered 2024-03-11: 100 mL via INTRAVENOUS

## 2024-03-11 MED ORDER — DIATRIZOATE MEGLUMINE & SODIUM 66-10 % PO SOLN
30.0000 mL | Freq: Once | ORAL | Status: AC
  Administered 2024-03-11: 30 mL via RECTAL

## 2024-03-12 ENCOUNTER — Ambulatory Visit

## 2024-03-12 ENCOUNTER — Ambulatory Visit: Admitting: Urology

## 2024-03-20 ENCOUNTER — Ambulatory Visit: Admitting: Urology

## 2024-03-20 VITALS — BP 129/81 | HR 73 | Wt 128.0 lb

## 2024-03-20 DIAGNOSIS — R8271 Bacteriuria: Secondary | ICD-10-CM

## 2024-03-20 DIAGNOSIS — N319 Neuromuscular dysfunction of bladder, unspecified: Secondary | ICD-10-CM | POA: Diagnosis not present

## 2024-03-20 DIAGNOSIS — R3989 Other symptoms and signs involving the genitourinary system: Secondary | ICD-10-CM

## 2024-03-20 DIAGNOSIS — R319 Hematuria, unspecified: Secondary | ICD-10-CM | POA: Diagnosis not present

## 2024-03-20 DIAGNOSIS — N952 Postmenopausal atrophic vaginitis: Secondary | ICD-10-CM

## 2024-03-20 DIAGNOSIS — G8929 Other chronic pain: Secondary | ICD-10-CM

## 2024-03-20 DIAGNOSIS — R102 Pelvic and perineal pain unspecified side: Secondary | ICD-10-CM | POA: Diagnosis not present

## 2024-03-21 LAB — URINALYSIS, COMPLETE
Bilirubin, UA: NEGATIVE
Glucose, UA: NEGATIVE
Ketones, UA: NEGATIVE
Nitrite, UA: NEGATIVE
Protein,UA: NEGATIVE
Specific Gravity, UA: 1.01 (ref 1.005–1.030)
Urobilinogen, Ur: 0.2 mg/dL (ref 0.2–1.0)
pH, UA: 6 (ref 5.0–7.5)

## 2024-03-21 LAB — MICROSCOPIC EXAMINATION: WBC, UA: >30 /HPF — AB (ref 0–5)

## 2024-03-21 NOTE — Progress Notes (Signed)
 In and Out Catheterization  Patient is present today for a I & O catheterization due to inability to give a clean sample. Patient was cleaned and prepped in a sterile fashion with betadine  . A 14FR cath was inserted no complications were noted , 95ml of urine return was noted, urine was yellow in color. A clean urine sample was collected for UA and Culture. Bladder was drained  And catheter was removed with out difficulty.    Performed by: T.Careena Degraffenreid LPN

## 2024-03-23 ENCOUNTER — Encounter: Payer: Self-pay | Admitting: Urology

## 2024-03-23 NOTE — Progress Notes (Signed)
 03/20/2024 4:54 PM   Cristina Wade 09/19/1970 [id number]  Referring provider: Dineen Rollene MATSU, FNP [address] [place],  [place] [address]  Urological history: 1.  High risk hematuria - non-smoker - Workup (2015) - bilateral punctate stones - cysto (2019) - NED - non-contrast CT (2022) -punctate bilateral stones - cysto (2022) - NED - RUS (08/2022) - bilateral stones - PET scan ((09/2022) - NED - CTU (12/2022) - bilateral nephrolithiasis - RUS (06/2023) - bilateral nephrolithiasis - contrast CT (11/2023) -punctate lower pole kidney stones - cysto (01/2024) NED  - MR abdomen (02/2024) - NED  - CT (03/2024) - NED  2. Neurogenic bladder -UDS 2017 indicating nonobstructive urinary retention with poor bladder sensation and external sphincter dyssynergia -completed 12 weeks of PTNS -completed pelvic floor PT -manages with self cathing three times daily  3. Nephrolithiasis -non-contrast CT 2022 2 mm stone in the right kidney lower pole without hydronephrosis.  Three tiny stones in left kidney lower pole region without hydronephrosis -CTU (12/2022) -bilateral nephrolithiasis -RUS (06/2023) - bilateral non obstructing stones  -contrast CT (11/2023) -punctate lower pole kidney stones  4. rUTI's - cysto x 2 - NED - several CT's - large stool burden - CT cystogram (07/2023) -no findings for colovesicular fistula - MRI pelvis (08/2023) -no evidence for colovesicular fistula - cysto (01/2024) no evidence for fistula  - CT w oral and rectal contrast (12/20250 no evidence for fistula   Chief Complaint  Patient presents with   Recurrent UTI   HPI: Cristina Wade is a 53 y.o. woman who presents today for follow up with her sister, Cristina Wade, for blood in her urine.    Previous records reviewed.   She has been seeing blood in her urine.  She is having to cath herself up to 15 times to get a successful placement of the catheter into her bladder.  Patient denies any  modifying or aggravating factors.  Patient denies any recent UTI's, dysuria or suprapubic/flank pain.  Patient denies any fevers, chills, nausea or vomiting.    CATH UA yellow clear, specific gravity 1.010, ph 6.0, 1+ leukocytes, trace heme, > 30 WBC's, 3-10 RBC's, 0-10 epithelial cells, hyaline casts present and many bacteria.    PMH: Past Medical History:  Diagnosis Date   Anxiety    Asthma    Chronic pelvic pain in female 2018   Cystitis    Cystocele    Endometrial polyp    Endometriosis    per pt report but never seen during surg   Frequent headaches    Gastritis    GERD (gastroesophageal reflux disease)    rare   Gross hematuria    History of kidney stones    Microscopic hematuria    Migraine    Migraines    Neuropathy    Osteopenia    Urinary disorder    UTI (lower urinary tract infection)     Surgical History: Past Surgical History:  Procedure Laterality Date   COLONOSCOPY N/A 11/05/2014   Procedure: COLONOSCOPY;  Surgeon: Lamar ONEIDA Holmes, MD;  Location: [facility] ENDOSCOPY;  Service: Endoscopy;  Laterality: N/A;   COLONOSCOPY  11/2016   Dr. Holmes   CYSTOSCOPY  2007   with biopsy    CYSTOSCOPY N/A 06/01/2016   Procedure: CYSTOSCOPY;  Surgeon: Glory High, MD;  Location: [facility] ORS;  Service: Gynecology;  Laterality: N/A;   DIAGNOSTIC LAPAROSCOPY     DILATATION & CURETTAGE/HYSTEROSCOPY WITH MYOSURE N/A 04/13/2015   Procedure: DILATATION & CURETTAGE/HYSTEROSCOPY  WITH MYOSURE/POLYPECTOMY;  Surgeon: Mitzie BROCKS Ward, MD;  Location: [facility] ORS;  Service: Gynecology;  Laterality: N/A;   DILATATION & CURETTAGE/HYSTEROSCOPY WITH MYOSURE N/A 06/01/2016   Procedure: DILATATION & CURETTAGE/HYSTEROSCOPY WITH MYOSURE;  Surgeon: Glory High, MD;  Location: [facility] ORS;  Service: Gynecology;  Laterality: N/A;   DILATION AND CURETTAGE OF UTERUS     ESOPHAGOGASTRODUODENOSCOPY (EGD) WITH PROPOFOL  N/A 08/31/2022   Procedure: ESOPHAGOGASTRODUODENOSCOPY (EGD) WITH PROPOFOL ;  Surgeon:  Onita Elspeth Sharper, DO;  Location: [facility] ENDOSCOPY;  Service: Gastroenterology;  Laterality: N/A;   GUM SURGERY     laproscopy  2007   POLYPECTOMY     endometrial    Home Medications:  Allergies as of 03/20/2024       Reactions   Lactose Intolerance (gi)    Pt consumes milk in recipes and dairy foods as as ice cream and yogurt, however, states her stomach will hurt if she consumes too much. She was also told milk produces excess mucous    Omeprazole  Other (See Comments)   Pt reports Chest pain, HA and Dysuria. 01/05/22 patient states she is able to take omeprazole  now without a reaction   Penicillins Itching, Rash        Medication List        Accurate as of March 20, 2024 11:59 PM. If you have any questions, ask your nurse or doctor.          aspirin  EC 81 MG tablet Take 81 mg by mouth daily. Swallow whole.   BORIC ACID EX 40 mg by Other route 2 (two) times daily.   CALCIUM  1200 PO Take 2-3 tablets by mouth 2 (two) times daily. Reported on 05/26/2015   cholecalciferol  10 MCG (400 UNIT) Tabs tablet Commonly known as: VITAMIN D3 Take 400 Units by mouth daily.   clobetasol cream 0.05 % Commonly known as: TEMOVATE Apply 1 Application topically 2 (two) times daily.   Linzess 290 MCG Caps capsule Generic drug: linaclotide Take 290 mcg by mouth daily.   methenamine  1 g tablet Commonly known as: Hiprex  Take 1 tablet (1 g total) by mouth 2 (two) times daily with a meal.   MULTIVITAMIN ADULT PO Take 1 tablet by mouth daily.   pantoprazole  40 MG tablet Commonly known as: PROTONIX  Take 1 tablet (40 mg total) by mouth 2 (two) times daily before a meal.   PEPCID  PO Take by mouth.   psyllium 0.52 g capsule Commonly known as: REGULOID Take 0.52 g by mouth daily.   sucralfate  1 g tablet Commonly known as: Carafate  Take 1 tablet (1 g total) by mouth at bedtime as needed.   ZINC  OXIDE PO Take 1 tablet by mouth daily.        Allergies:  Allergies   Allergen Reactions   Lactose Intolerance (Gi)     Pt consumes milk in recipes and dairy foods as as ice cream and yogurt, however, states her stomach will hurt if she consumes too much. She was also told milk produces excess mucous    Omeprazole  Other (See Comments)    Pt reports Chest pain, HA and Dysuria. 01/05/22 patient states she is able to take omeprazole  now without a reaction   Penicillins Itching and Rash    Family History: Family History  Problem Relation Age of Onset   Arthritis Mother    Heart disease Mother    Stroke Mother        TIA   Hypertension Mother    Diabetes Mother  Kidney cancer Mother 44   Heart failure Mother    Hyperlipidemia Mother    Colon cancer Father 80   Arthritis Father    Hyperlipidemia Father    Transient ischemic attack Father    Cancer - Colon Father 57   Prostate cancer Father 61   Valvular heart disease Father    Cancer Maternal Grandmother    Cancer Paternal Grandfather        prostate   Breast cancer Cousin 42       has contact    Social History:  reports that she has never smoked. She has never been exposed to tobacco smoke. She has never used smokeless tobacco. She reports that she does not drink alcohol and does not use drugs.  ROS: Pertinent ROS in HPI  Physical Exam: BP 129/81 (BP Location: Left Arm, Patient Position: Sitting, Cuff Size: Normal)   Pulse 73   Wt 128 lb (58.1 kg)   LMP 11/03/2021 (Exact Date)   SpO2 100%   BMI 18.37 kg/m   Constitutional:  Well nourished. Alert and oriented, No acute distress. HEENT: [place] AT, moist mucus membranes.  Trachea midline Cardiovascular: No clubbing, cyanosis, or edema. Respiratory: Normal respiratory effort, no increased work of breathing. GU: No CVA tenderness.  No bladder fullness or masses.  Normal urethral meatus, no lesions, no prolapse, no discharge.   No urethral masses, tenderness and/or tenderness. Anus and perineum are without rashes or lesions.    Neurologic:  Grossly intact, no focal deficits, moving all 4 extremities. Psychiatric: Normal mood and affect.    Laboratory Data: See EPIC and HPI  I have reviewed the labs.  Pertinent Imaging: N/A  Assessment & Plan:    1.  Neurogenic bladder - Continue self-catheterization, gave her samples of luje to see if they were more comfortable  - advised her to stand over a mirror or place a tampon in the vagina prior to cathing   2. Chronic pelvic pain - since August she has had 4 CT scans, a MRI and a cysto with no worrisome findings   3. High risk hematuria - She has had several CT scans, pelvic MRI, and several cystoscopies all which have not had any worrisome findings  4. Vaginal atrophy - Continue vaginal estrogen cream as prescribed  5. rUTI's vs colonization - Despite repeated counseling on the distinction between urinary tract infection and colonization, the patient demonstrates persistent misunderstanding and remains hypervigilant about her urine. This has resulted in recurrent specialist consultations, multiple imaging studies, and repeated antibiotic prescriptions. She continues to struggle with comprehension of this concept even when reviewed multiple times during a single encounter. - she insisted that her urine be sent for culture despite having no urinary tract infection symptoms and when I explained that the blood she is seeing is likely the result of mucosal injury when she has multiple attempts to insert into her vagina                                             Return if symptoms worsen or fail to improve.  These notes generated with voice recognition software. I apologize for typographical errors.  CLOTILDA HELON RIGGERS  [facility] [address]  [address] [place], [place] [address] [phone]

## 2024-03-25 LAB — CULTURE, URINE COMPREHENSIVE

## 2024-03-26 ENCOUNTER — Ambulatory Visit: Payer: Self-pay | Admitting: Urology

## 2024-04-07 ENCOUNTER — Ambulatory Visit
Admission: RE | Admit: 2024-04-07 | Discharge: 2024-04-07 | Disposition: A | Source: Ambulatory Visit | Attending: Otolaryngology | Admitting: Otolaryngology

## 2024-04-07 ENCOUNTER — Other Ambulatory Visit: Payer: Self-pay | Admitting: Otolaryngology

## 2024-04-07 DIAGNOSIS — R059 Cough, unspecified: Secondary | ICD-10-CM

## 2024-04-14 ENCOUNTER — Encounter: Payer: Self-pay | Admitting: Podiatry

## 2024-04-14 ENCOUNTER — Ambulatory Visit (INDEPENDENT_AMBULATORY_CARE_PROVIDER_SITE_OTHER): Admitting: Podiatry

## 2024-04-14 DIAGNOSIS — M722 Plantar fascial fibromatosis: Secondary | ICD-10-CM | POA: Diagnosis not present

## 2024-04-14 DIAGNOSIS — S86312D Strain of muscle(s) and tendon(s) of peroneal muscle group at lower leg level, left leg, subsequent encounter: Secondary | ICD-10-CM

## 2024-04-14 NOTE — Progress Notes (Signed)
 She presents today date of surgery 11/16/2023.  She is status post peroneal tendon repair left.  She states that she is doing quite well she like to get back to work full-time.  She states that physical therapy says that I have a full range of motion and that there is not much that they can do for me now.  Objective: Vital signs are stable she is alert and oriented x 3.  Pulses are palpable.  She has fantastic range of motion no edema no erythema cellulitis drainage or odor she has some tenderness on palpation of the distal aspect of the scar most likely partially encompassing the sural nerve.  Assessment: Well-healing surgical foot.  Plan: Follow-up with me on an as-needed basis.  I recommended that she can discontinue physical therapy if it is okay with them.

## 2024-04-21 ENCOUNTER — Telehealth: Payer: Self-pay

## 2024-04-21 NOTE — Telephone Encounter (Signed)
 Called pt to triage their symptoms but got voicemail. Left message for her to return our call this morning

## 2024-04-24 ENCOUNTER — Telehealth: Payer: Self-pay

## 2024-04-24 NOTE — Telephone Encounter (Signed)
 Left VM for patient in regards to appointment for tomorrow @ 10:20am.  Advised patient that appointment has been scheduled and if there are any issues with the appointment time to contact the office.  Andrea Kirks LPN

## 2024-04-25 ENCOUNTER — Ambulatory Visit: Admitting: Urology

## 2024-04-30 ENCOUNTER — Ambulatory Visit: Admitting: Internal Medicine

## 2024-05-02 NOTE — Progress Notes (Unsigned)
 "    05/05/2024 9:52 AM   Cristina Wade 09-May-1970 [id number]  Referring provider: Dineen Rollene MATSU, FNP [address] [place],  [place] [address]  Urological history: 1.  High risk hematuria - non-smoker - Workup (2015) - bilateral punctate stones - cysto (2019) - NED - non-contrast CT (2022) -punctate bilateral stones - cysto (2022) - NED - RUS (08/2022) - bilateral stones - PET scan ((09/2022) - NED - CTU (12/2022) - bilateral nephrolithiasis - RUS (06/2023) - bilateral nephrolithiasis - contrast CT (11/2023) -punctate lower pole kidney stones - cysto (01/2024) NED  - MR abdomen (02/2024) - NED  - CT (03/2024) - NED  2. Neurogenic bladder -UDS 2017 indicating nonobstructive urinary retention with poor bladder sensation and external sphincter dyssynergia -completed 12 weeks of PTNS -completed pelvic floor PT -manages with self cathing three times daily  3. Nephrolithiasis -non-contrast CT 2022 2 mm stone in the right kidney lower pole without hydronephrosis.  Three tiny stones in left kidney lower pole region without hydronephrosis -CTU (12/2022) -bilateral nephrolithiasis -RUS (06/2023) - bilateral non obstructing stones  -contrast CT (11/2023) -punctate lower pole kidney stones  4. rUTI's - cysto x 2 - NED - several CT's - large stool burden - CT cystogram (07/2023) -no findings for colovesicular fistula - MRI pelvis (08/2023) -no evidence for colovesicular fistula - cysto (01/2024) no evidence for fistula  - CT w oral and rectal contrast (12/20250 no evidence for fistula   No chief complaint on file.  HPI: Cristina Wade is a 54 y.o. woman who presents today for with her sister, Cristina Wade, for blood in her urine.    Previous records reviewed.   UA ***  PMH: Past Medical History:  Diagnosis Date   Anxiety    Asthma    Chronic pelvic pain in female 2018   Cystitis    Cystocele    Endometrial polyp    Endometriosis    per pt report but never seen  during surg   Frequent headaches    Gastritis    GERD (gastroesophageal reflux disease)    rare   Gross hematuria    History of kidney stones    Microscopic hematuria    Migraine    Migraines    Neuropathy    Osteopenia    Urinary disorder    UTI (lower urinary tract infection)     Surgical History: Past Surgical History:  Procedure Laterality Date   COLONOSCOPY N/A 11/05/2014   Procedure: COLONOSCOPY;  Surgeon: Lamar ONEIDA Holmes, MD;  Location: [facility] ENDOSCOPY;  Service: Endoscopy;  Laterality: N/A;   COLONOSCOPY  11/2016   Dr. Holmes   CYSTOSCOPY  2007   with biopsy    CYSTOSCOPY N/A 06/01/2016   Procedure: CYSTOSCOPY;  Surgeon: Glory High, MD;  Location: [facility] ORS;  Service: Gynecology;  Laterality: N/A;   DIAGNOSTIC LAPAROSCOPY     DILATATION & CURETTAGE/HYSTEROSCOPY WITH MYOSURE N/A 04/13/2015   Procedure: DILATATION & CURETTAGE/HYSTEROSCOPY WITH MYOSURE/POLYPECTOMY;  Surgeon: Mitzie BROCKS Ward, MD;  Location: [facility] ORS;  Service: Gynecology;  Laterality: N/A;   DILATATION & CURETTAGE/HYSTEROSCOPY WITH MYOSURE N/A 06/01/2016   Procedure: DILATATION & CURETTAGE/HYSTEROSCOPY WITH MYOSURE;  Surgeon: Glory High, MD;  Location: [facility] ORS;  Service: Gynecology;  Laterality: N/A;   DILATION AND CURETTAGE OF UTERUS     ESOPHAGOGASTRODUODENOSCOPY (EGD) WITH PROPOFOL  N/A 08/31/2022   Procedure: ESOPHAGOGASTRODUODENOSCOPY (EGD) WITH PROPOFOL ;  Surgeon: Onita Elspeth Sharper, DO;  Location: [facility] ENDOSCOPY;  Service: Gastroenterology;  Laterality: N/A;   GUM  SURGERY     laproscopy  2007   POLYPECTOMY     endometrial    Home Medications:  Allergies as of 05/05/2024       Reactions   Lactose Intolerance (gi)    Pt consumes milk in recipes and dairy foods as as ice cream and yogurt, however, states her stomach will hurt if she consumes too much. She was also told milk produces excess mucous    Omeprazole  Other (See Comments)   Pt reports Chest pain, HA and Dysuria. 01/05/22  patient states she is able to take omeprazole  now without a reaction   Penicillins Itching, Rash        Medication List        Accurate as of May 02, 2024  9:52 AM. If you have any questions, ask your nurse or doctor.          aspirin  EC 81 MG tablet Take 81 mg by mouth daily. Swallow whole.   BORIC ACID EX 40 mg by Other route 2 (two) times daily.   CALCIUM  1200 PO Take 2-3 tablets by mouth 2 (two) times daily. Reported on 05/26/2015   cholecalciferol  10 MCG (400 UNIT) Tabs tablet Commonly known as: VITAMIN D3 Take 400 Units by mouth daily.   clobetasol cream 0.05 % Commonly known as: TEMOVATE Apply 1 Application topically 2 (two) times daily.   Linzess 290 MCG Caps capsule Generic drug: linaclotide Take 290 mcg by mouth daily.   methenamine  1 g tablet Commonly known as: Hiprex  Take 1 tablet (1 g total) by mouth 2 (two) times daily with a meal.   MULTIVITAMIN ADULT PO Take 1 tablet by mouth daily.   pantoprazole  40 MG tablet Commonly known as: PROTONIX  Take 1 tablet (40 mg total) by mouth 2 (two) times daily before a meal.   PEPCID  PO Take by mouth.   psyllium 0.52 g capsule Commonly known as: REGULOID Take 0.52 g by mouth daily.   sucralfate  1 g tablet Commonly known as: Carafate  Take 1 tablet (1 g total) by mouth at bedtime as needed.   ZINC  OXIDE PO Take 1 tablet by mouth daily.        Allergies:  Allergies  Allergen Reactions   Lactose Intolerance (Gi)     Pt consumes milk in recipes and dairy foods as as ice cream and yogurt, however, states her stomach will hurt if she consumes too much. She was also told milk produces excess mucous    Omeprazole  Other (See Comments)    Pt reports Chest pain, HA and Dysuria. 01/05/22 patient states she is able to take omeprazole  now without a reaction   Penicillins Itching and Rash    Family History: Family History  Problem Relation Age of Onset   Arthritis Mother    Heart disease Mother     Stroke Mother        TIA   Hypertension Mother    Diabetes Mother    Kidney cancer Mother 1   Heart failure Mother    Hyperlipidemia Mother    Colon cancer Father 72   Arthritis Father    Hyperlipidemia Father    Transient ischemic attack Father    Cancer - Colon Father 10   Prostate cancer Father 81   Valvular heart disease Father    Cancer Maternal Grandmother    Cancer Paternal Grandfather        prostate   Breast cancer Cousin 5       has contact  Social History:  reports that she has never smoked. She has never been exposed to tobacco smoke. She has never used smokeless tobacco. She reports that she does not drink alcohol and does not use drugs.  ROS: Pertinent ROS in HPI  Physical Exam: LMP 11/03/2021   Constitutional:  Well nourished. Alert and oriented, No acute distress. HEENT: [place] AT, moist mucus membranes.  Trachea midline, no masses. Cardiovascular: No clubbing, cyanosis, or edema. Respiratory: Normal respiratory effort, no increased work of breathing. GU: No CVA tenderness.  No bladder fullness or masses.  Recession of labia minora, dry, pale vulvar vaginal mucosa and loss of mucosal ridges and folds.  Normal urethral meatus, no lesions, no prolapse, no discharge.   No urethral masses, tenderness and/or tenderness. No bladder fullness, tenderness or masses. *** vagina mucosa, *** estrogen effect, no discharge, no lesions, *** pelvic support, *** cystocele and *** rectocele noted.  No cervical motion tenderness.  Uterus is freely mobile and non-fixed.  No adnexal/parametria masses or tenderness noted.  Anus and perineum are without rashes or lesions.   ***  Neurologic: Grossly intact, no focal deficits, moving all 4 extremities. Psychiatric: Normal mood and affect.    Laboratory Data: See EPIC and HPI  I have reviewed the labs.  Pertinent Imaging: N/A  Assessment & Plan:    1.  Neurogenic bladder - ***  2. Chronic pelvic pain - since August she has had  4 CT scans, a MRI and a cysto with no worrisome findings   3. High risk hematuria - She has had several CT scans, pelvic MRI, and several cystoscopies all which have not had any worrisome findings  4. Vaginal atrophy - Continue vaginal estrogen cream as prescribed  5. rUTI's vs colonization - ***                                             No follow-ups on file.  These notes generated with voice recognition software. I apologize for typographical errors.  CLOTILDA HELON RIGGERS  [facility] [address]  [address] [place], [place] [address] [phone]  "

## 2024-05-05 ENCOUNTER — Ambulatory Visit: Admitting: Urology

## 2024-05-05 ENCOUNTER — Encounter: Payer: Self-pay | Admitting: Urology

## 2024-05-05 VITALS — BP 117/79 | HR 80 | Wt 128.0 lb

## 2024-05-05 DIAGNOSIS — G8929 Other chronic pain: Secondary | ICD-10-CM

## 2024-05-05 DIAGNOSIS — R3989 Other symptoms and signs involving the genitourinary system: Secondary | ICD-10-CM

## 2024-05-05 DIAGNOSIS — R102 Pelvic and perineal pain unspecified side: Secondary | ICD-10-CM

## 2024-05-05 DIAGNOSIS — N952 Postmenopausal atrophic vaginitis: Secondary | ICD-10-CM

## 2024-05-05 DIAGNOSIS — R8271 Bacteriuria: Secondary | ICD-10-CM | POA: Diagnosis not present

## 2024-05-05 DIAGNOSIS — R319 Hematuria, unspecified: Secondary | ICD-10-CM

## 2024-05-05 LAB — URINALYSIS, COMPLETE
Bilirubin, UA: NEGATIVE
Glucose, UA: NEGATIVE
Ketones, UA: NEGATIVE
Nitrite, UA: NEGATIVE
Protein,UA: NEGATIVE
Specific Gravity, UA: 1.01 (ref 1.005–1.030)
Urobilinogen, Ur: 0.2 mg/dL (ref 0.2–1.0)
pH, UA: 6 (ref 5.0–7.5)

## 2024-05-05 LAB — MICROSCOPIC EXAMINATION: WBC, UA: >30 /HPF — AB (ref 0–5)

## 2024-05-13 ENCOUNTER — Ambulatory Visit: Admitting: Urology

## 2024-05-15 ENCOUNTER — Telehealth: Payer: Self-pay | Admitting: Family

## 2024-05-15 ENCOUNTER — Ambulatory Visit: Admitting: Urology

## 2024-05-15 NOTE — Telephone Encounter (Signed)
 Copied from CRM #[id number]. Topic: General - Call Back - No Documentation >> May 15, 2024  3:13 PM Rea C wrote: Reason for CRM: Patient would like the information of the hand surgeon that was she referred to recently.   Pt contact is  [phone].

## 2024-05-19 ENCOUNTER — Other Ambulatory Visit: Payer: Self-pay

## 2024-05-19 ENCOUNTER — Encounter: Payer: Self-pay | Admitting: Emergency Medicine

## 2024-05-19 ENCOUNTER — Emergency Department
Admission: EM | Admit: 2024-05-19 | Discharge: 2024-05-19 | Disposition: A | Discharge status: Home / Self Care | Attending: Emergency Medicine | Admitting: Emergency Medicine

## 2024-05-19 DIAGNOSIS — K2900 Acute gastritis without bleeding: Secondary | ICD-10-CM | POA: Insufficient documentation

## 2024-05-19 LAB — COMPREHENSIVE METABOLIC PANEL WITH GFR
ALT: 15 U/L (ref 0–44)
AST: 18 U/L (ref 15–41)
Albumin: 4.6 g/dL (ref 3.5–5.0)
Alkaline Phosphatase: 79 U/L (ref 38–126)
Anion gap: 10 (ref 5–15)
BUN: 13 mg/dL (ref 6–20)
CO2: 27 mmol/L (ref 22–32)
Calcium: 9.3 mg/dL (ref 8.9–10.3)
Chloride: 104 mmol/L (ref 98–111)
Creatinine, Ser: 0.65 mg/dL (ref 0.44–1.00)
GFR, Estimated: >60 mL/min
Glucose, Bld: 92 mg/dL (ref 70–99)
Potassium: 3.8 mmol/L (ref 3.5–5.1)
Sodium: 141 mmol/L (ref 135–145)
Total Bilirubin: 0.4 mg/dL (ref 0.0–1.2)
Total Protein: 7.3 g/dL (ref 6.5–8.1)

## 2024-05-19 LAB — URINALYSIS, ROUTINE W REFLEX MICROSCOPIC
Bilirubin Urine: NEGATIVE
Glucose, UA: NEGATIVE mg/dL
Hgb urine dipstick: NEGATIVE
Ketones, ur: NEGATIVE mg/dL
Nitrite: NEGATIVE
Protein, ur: NEGATIVE mg/dL
Specific Gravity, Urine: 1.009 (ref 1.005–1.030)
pH: 6 (ref 5.0–8.0)

## 2024-05-19 LAB — CBC
HCT: 42.8 % (ref 36.0–46.0)
Hemoglobin: 13.8 g/dL (ref 12.0–15.0)
MCH: 29.4 pg (ref 26.0–34.0)
MCHC: 32.2 g/dL (ref 30.0–36.0)
MCV: 91.1 fL (ref 80.0–100.0)
Platelets: 223 10*3/uL (ref 150–400)
RBC: 4.7 MIL/uL (ref 3.87–5.11)
RDW: 12.8 % (ref 11.5–15.5)
WBC: 5.4 10*3/uL (ref 4.0–10.5)
nRBC: 0 % (ref 0.0–0.2)

## 2024-05-19 LAB — LIPASE, BLOOD: Lipase: 68 U/L — ABNORMAL HIGH (ref 11–51)

## 2024-05-19 MED ORDER — SUCRALFATE 1 G PO TABS: 1.0000 g | ORAL_TABLET | Freq: Three times a day (TID) | ORAL | 0 refills | Status: AC

## 2024-05-19 NOTE — ED Triage Notes (Signed)
 Pt with abdominal pain, ongoing.  Saw GI last week for same.  Pt reports she has a test that is coming in the mail from GI but hasn't received it yet. She reports new nausea, which is what prompted coming in today.

## 2024-05-19 NOTE — ED Provider Notes (Signed)
 "  [facility] Provider Note    Event Date/Time   First MD Initiated Contact with Patient 05/19/24 1203     (approximate)   History   Abdominal Pain   HPI  Cristina Wade is a 54 y.o. female who presents with abdominal bloating.  Patient reports she has seen GI, records reviewed, she saw Dr. Maryruth 3 to 4 days ago, she is having hydrogen testing done.  She reports she is waiting for it to be sent in the mail.  Today she felt nauseated however that is resolved.  Because of this she decided to present to the emergency department.  No dysuria, no flank pain     Physical Exam   Triage Vital Signs: ED Triage Vitals  Encounter Vitals Group     BP 05/19/24 1140 117/70     Girls Systolic BP Percentile --      Girls Diastolic BP Percentile --      Boys Systolic BP Percentile --      Boys Diastolic BP Percentile --      Pulse Rate 05/19/24 1140 73     Resp 05/19/24 1140 18     Temp 05/19/24 1140 97.8 F (36.6 C)     Temp Source 05/19/24 1140 Oral     SpO2 05/19/24 1140 98 %     Weight 05/19/24 1209 58.1 kg (128 lb)     Height 05/19/24 1209 1.778 m (5' 10)     Head Circumference --      Peak Flow --      Pain Score 05/19/24 1144 5     Pain Loc --      Pain Education --      Exclude from Growth Chart --     Most recent vital signs: Vitals:   05/19/24 1140  BP: 117/70  Pulse: 73  Resp: 18  Temp: 97.8 F (36.6 C)  SpO2: 98%     General: Awake, no distress.  CV:  Good peripheral perfusion.  Resp:  Normal effort.  Abd:  No distention.  Soft, nontender, reassuring exam Other:  No CVA tenderness   ED Results / Procedures / Treatments   Labs (all labs ordered are listed, but only abnormal results are displayed) Labs Reviewed  LIPASE, BLOOD - Abnormal; Notable for the following components:      Result Value   Lipase 68 (*)    All other components within normal limits  URINALYSIS, ROUTINE W REFLEX MICROSCOPIC - Abnormal; Notable for the  following components:   Color, Urine YELLOW (*)    APPearance HAZY (*)    Leukocytes,Ua LARGE (*)    Bacteria, UA MANY (*)    Non Squamous Epithelial PRESENT (*)    All other components within normal limits  URINE CULTURE  COMPREHENSIVE METABOLIC PANEL WITH GFR  CBC     EKG     RADIOLOGY     PROCEDURES:  Critical Care performed:   Procedures   MEDICATIONS ORDERED IN ED: Medications - No data to display   IMPRESSION / MDM / ASSESSMENT AND PLAN / ED COURSE  I reviewed the triage vital signs and the nursing notes. Patient's presentation is most consistent with acute complicated illness / injury requiring diagnostic workup.  Patient presents with symptoms as above, this appears to be an ongoing issue for her for which she is being worked up by GI.  She is requesting hydrogen breath test here in the emergency department, informed her that we do  not perform that test in the emergency department.  Question whether she may have a component of gastritis given nausea that she is having, abdominal exam is soft and benign.  Will add Carafate , she is on Protonix .  She is being evaluated for small intestinal bacterial overgrowth with the hydrogen breath test,          FINAL CLINICAL IMPRESSION(S) / ED DIAGNOSES   Final diagnoses:  Acute gastritis without hemorrhage, unspecified gastritis type     Rx / DC Orders   ED Discharge Orders          Ordered    sucralfate  (CARAFATE ) 1 g tablet  3 times daily with meals & bedtime        05/19/24 1238             Note:  This document was prepared using Dragon voice recognition software and may include unintentional dictation errors.   Arlander Charleston, MD 05/19/24 1444  "

## 2024-05-20 ENCOUNTER — Ambulatory Visit: Admitting: Urology

## 2024-05-21 LAB — URINE CULTURE: Culture: >=100000 — AB

## 2024-05-21 NOTE — Telephone Encounter (Signed)
 LVM to call back to schedule appt to discuss or inform pt she can go to [facility] walk in as well

## 2024-05-22 ENCOUNTER — Ambulatory Visit

## 2024-05-22 ENCOUNTER — Other Ambulatory Visit: Payer: Self-pay

## 2024-05-22 ENCOUNTER — Ambulatory Visit: Admitting: Urology

## 2024-05-22 DIAGNOSIS — R3989 Other symptoms and signs involving the genitourinary system: Secondary | ICD-10-CM

## 2024-05-22 LAB — BLADDER SCAN AMB NON-IMAGING: Scan Result: 84

## 2024-05-22 NOTE — Progress Notes (Unsigned)
 Pt being seen today for bladder scan and to have a CBC done. Pt will be called by PA with results of CBC. Pt also stating while here that she needed catheter supplies for her CIC at home. I was able to give patient samples from office.  Mathew Pinal, RN

## 2024-05-22 NOTE — Progress Notes (Addendum)
 ED Antimicrobial Stewardship Positive Culture Follow Up   Cristina Wade is an 54 y.o. female who presented to [facility] on 05/19/2024 with a chief complaint of  Chief Complaint  Patient presents with   Abdominal Pain    Recent Results (from the past 720 hours)  Microscopic Examination     Status: Abnormal   Collection Time: 05/05/24  9:30 AM   Urine  Result Value Ref Range Status   WBC, UA >30 (A) 0 - 5 /hpf Final   RBC, Urine 3-10 (A) 0 - 2 /hpf Final   Epithelial Cells (non renal) 0-10 0 - 10 /hpf Final   Bacteria, UA Many (A) None seen/Few Final  Urine Culture     Status: Abnormal   Collection Time: 05/19/24 11:45 AM   Specimen: Urine, Clean Catch  Result Value Ref Range Status   Specimen Description   Final    URINE, CLEAN CATCH Performed at [facility], [address], [place], [place] [address]    Special Requests   Final    NONE Performed at [facility], [address] [place], [place] [address]    Culture >=100,000 COLONIES/mL ESCHERICHIA COLI (A)  Final   Report Status 05/21/2024 FINAL  Final   Organism ID, Bacteria ESCHERICHIA COLI (A)  Final      Susceptibility   Escherichia coli - MIC*    AMPICILLIN 8 SENSITIVE Sensitive     CEFAZOLIN (URINE) Value in next row Sensitive      2 SENSITIVEThis is a modified FDA-approved test that has been validated and its performance characteristics determined by the reporting laboratory.  This laboratory is certified under the Clinical Laboratory Improvement Amendments CLIA as qualified to perform high complexity clinical laboratory testing.    CEFEPIME Value in next row Sensitive      2 SENSITIVEThis is a modified FDA-approved test that has been validated and its performance characteristics determined by the reporting laboratory.  This laboratory is certified under the Clinical Laboratory Improvement Amendments CLIA as qualified to perform high complexity clinical laboratory testing.    ERTAPENEM  Value in next  row Sensitive      2 SENSITIVEThis is a modified FDA-approved test that has been validated and its performance characteristics determined by the reporting laboratory.  This laboratory is certified under the Clinical Laboratory Improvement Amendments CLIA as qualified to perform high complexity clinical laboratory testing.    CEFTRIAXONE Value in next row Sensitive      2 SENSITIVEThis is a modified FDA-approved test that has been validated and its performance characteristics determined by the reporting laboratory.  This laboratory is certified under the Clinical Laboratory Improvement Amendments CLIA as qualified to perform high complexity clinical laboratory testing.    CIPROFLOXACIN  Value in next row Sensitive      2 SENSITIVEThis is a modified FDA-approved test that has been validated and its performance characteristics determined by the reporting laboratory.  This laboratory is certified under the Clinical Laboratory Improvement Amendments CLIA as qualified to perform high complexity clinical laboratory testing.    GENTAMICIN  Value in next row Sensitive      2 SENSITIVEThis is a modified FDA-approved test that has been validated and its performance characteristics determined by the reporting laboratory.  This laboratory is certified under the Clinical Laboratory Improvement Amendments CLIA as qualified to perform high complexity clinical laboratory testing.    NITROFURANTOIN  Value in next row Sensitive      2 SENSITIVEThis is a modified FDA-approved test that has been  validated and its performance characteristics determined by the reporting laboratory.  This laboratory is certified under the Clinical Laboratory Improvement Amendments CLIA as qualified to perform high complexity clinical laboratory testing.    TRIMETH /SULFA  Value in next row Resistant      2 SENSITIVEThis is a modified FDA-approved test that has been validated and its performance characteristics determined by the reporting laboratory.   This laboratory is certified under the Clinical Laboratory Improvement Amendments CLIA as qualified to perform high complexity clinical laboratory testing.    AMPICILLIN/SULBACTAM Value in next row Sensitive      2 SENSITIVEThis is a modified FDA-approved test that has been validated and its performance characteristics determined by the reporting laboratory.  This laboratory is certified under the Clinical Laboratory Improvement Amendments CLIA as qualified to perform high complexity clinical laboratory testing.    PIP/TAZO Value in next row Sensitive      <=4 SENSITIVEThis is a modified FDA-approved test that has been validated and its performance characteristics determined by the reporting laboratory.  This laboratory is certified under the Clinical Laboratory Improvement Amendments CLIA as qualified to perform high complexity clinical laboratory testing.    MEROPENEM  Value in next row Sensitive      <=4 SENSITIVEThis is a modified FDA-approved test that has been validated and its performance characteristics determined by the reporting laboratory.  This laboratory is certified under the Clinical Laboratory Improvement Amendments CLIA as qualified to perform high complexity clinical laboratory testing.    * >=100,000 COLONIES/mL ESCHERICHIA COLI    [x]  Patient discharged originally without antimicrobial agent and treatment is now indicated  Patient discharged [facility] ED on 2/2. Urine cultures came back positive growing E. Coli. Spoke with patient and endorsed bilateral kidney pain without fever or dysuria. Discussed patient with ED provider with plan for patient to start cephalexin .  On follow-up call with patient, patient preferred to call urology within the next two days. According to patient, oral antibiotics have not worked in the past. Patient preferred to call urology versus coming to [facility] ED for re-evaluation. Updated ED provider, and no new antibiotics called in for patient.  New antibiotic  prescription: none  ED Provider: Dr Jacolyn Leonor JAYSON Viviana, PharmD Pharmacy Resident  05/22/2024 3:31 PM

## 2024-05-23 ENCOUNTER — Ambulatory Visit: Payer: Self-pay | Admitting: Urology

## 2024-05-23 LAB — CBC WITH DIFFERENTIAL/PLATELET
Basophils Absolute: 0 10*3/uL (ref 0.0–0.2)
Basos: 1 %
EOS (ABSOLUTE): 0.1 10*3/uL (ref 0.0–0.4)
Eos: 2 %
Hematocrit: 40.4 % (ref 34.0–46.6)
Hemoglobin: 12.7 g/dL (ref 11.1–15.9)
Immature Grans (Abs): 0 10*3/uL (ref 0.0–0.1)
Immature Granulocytes: 0 %
Lymphocytes Absolute: 1.5 10*3/uL (ref 0.7–3.1)
Lymphs: 27 %
MCH: 29.2 pg (ref 26.6–33.0)
MCHC: 31.4 g/dL — ABNORMAL LOW (ref 31.5–35.7)
MCV: 93 fL (ref 79–97)
Monocytes Absolute: 0.5 10*3/uL (ref 0.1–0.9)
Monocytes: 9 %
Neutrophils Absolute: 3.3 10*3/uL (ref 1.4–7.0)
Neutrophils: 61 %
Platelets: 216 10*3/uL (ref 150–450)
RBC: 4.35 x10E6/uL (ref 3.77–5.28)
RDW: 12.6 % (ref 11.7–15.4)
WBC: 5.4 10*3/uL (ref 3.4–10.8)

## 2024-05-23 NOTE — Telephone Encounter (Signed)
 Called pt and she stated the hand surgeon referral information is for her TWIN SISTER Kailen Name. i looked up Makinzi Prieur her last visit in the office 05/15/2024 with Dr Onesimo. in his notes he did mention her right thumb locks up.  If her symptoms worsen can consider referral to hand surgeon for further evaluation. I mention to her to go to [facility]

## 2024-05-26 ENCOUNTER — Ambulatory Visit: Admitting: Podiatry

## 2024-05-27 ENCOUNTER — Ambulatory Visit

## 2024-05-29 ENCOUNTER — Ambulatory Visit: Admitting: Urology

## 2024-06-03 ENCOUNTER — Ambulatory Visit: Admitting: Urology

## 2024-06-05 ENCOUNTER — Ambulatory Visit: Admitting: Urology

## 2024-06-17 ENCOUNTER — Ambulatory Visit: Admitting: Urology

## 2024-06-19 ENCOUNTER — Ambulatory Visit: Admitting: Urology

## 2024-06-24 ENCOUNTER — Ambulatory Visit: Admitting: Urology

## 2024-06-26 ENCOUNTER — Ambulatory Visit: Admitting: Urology
